# Patient Record
Sex: Female | Born: 1950 | Race: Black or African American | Hispanic: No | Marital: Single | Smoking: Former smoker
Health system: Southern US, Community
[De-identification: ages and names within clinical notes are randomized; demographics above are authoritative.]

## PROBLEM LIST (undated history)

## (undated) ENCOUNTER — Emergency Department (HOSPITAL_COMMUNITY): Payer: PRIVATE HEALTH INSURANCE | Source: Home / Self Care

## (undated) DIAGNOSIS — I10 Essential (primary) hypertension: Secondary | ICD-10-CM

## (undated) DIAGNOSIS — K219 Gastro-esophageal reflux disease without esophagitis: Secondary | ICD-10-CM

## (undated) DIAGNOSIS — R569 Unspecified convulsions: Secondary | ICD-10-CM

## (undated) DIAGNOSIS — M109 Gout, unspecified: Secondary | ICD-10-CM

## (undated) DIAGNOSIS — Z87440 Personal history of urinary (tract) infections: Secondary | ICD-10-CM

## (undated) DIAGNOSIS — F329 Major depressive disorder, single episode, unspecified: Secondary | ICD-10-CM

## (undated) DIAGNOSIS — N183 Chronic kidney disease, stage 3 (moderate): Secondary | ICD-10-CM

## (undated) DIAGNOSIS — R202 Paresthesia of skin: Secondary | ICD-10-CM

## (undated) DIAGNOSIS — F32A Depression, unspecified: Secondary | ICD-10-CM

## (undated) DIAGNOSIS — R0602 Shortness of breath: Secondary | ICD-10-CM

## (undated) DIAGNOSIS — C801 Malignant (primary) neoplasm, unspecified: Secondary | ICD-10-CM

## (undated) DIAGNOSIS — M1990 Unspecified osteoarthritis, unspecified site: Secondary | ICD-10-CM

## (undated) DIAGNOSIS — C20 Malignant neoplasm of rectum: Secondary | ICD-10-CM

## (undated) DIAGNOSIS — E611 Iron deficiency: Secondary | ICD-10-CM

## (undated) DIAGNOSIS — C3490 Malignant neoplasm of unspecified part of unspecified bronchus or lung: Secondary | ICD-10-CM

## (undated) DIAGNOSIS — F79 Unspecified intellectual disabilities: Secondary | ICD-10-CM

## (undated) DIAGNOSIS — E538 Deficiency of other specified B group vitamins: Secondary | ICD-10-CM

## (undated) DIAGNOSIS — R200 Anesthesia of skin: Secondary | ICD-10-CM

## (undated) HISTORY — PX: ABDOMINAL HYSTERECTOMY: SHX81

## (undated) HISTORY — DX: Chronic kidney disease, stage 3 (moderate): N18.3

## (undated) HISTORY — DX: Deficiency of other specified B group vitamins: E53.8

## (undated) HISTORY — DX: Malignant neoplasm of rectum: C20

## (undated) HISTORY — DX: Iron deficiency: E61.1

---

## 2000-08-11 ENCOUNTER — Other Ambulatory Visit: Admission: RE | Admit: 2000-08-11 | Discharge: 2000-08-11 | Payer: Self-pay | Admitting: Family Medicine

## 2000-08-12 ENCOUNTER — Ambulatory Visit (HOSPITAL_COMMUNITY): Admission: RE | Admit: 2000-08-12 | Discharge: 2000-08-12 | Payer: Self-pay | Admitting: Family Medicine

## 2000-08-12 ENCOUNTER — Encounter: Payer: Self-pay | Admitting: Family Medicine

## 2001-02-17 ENCOUNTER — Encounter: Payer: Self-pay | Admitting: Family Medicine

## 2001-02-17 ENCOUNTER — Ambulatory Visit (HOSPITAL_COMMUNITY): Admission: RE | Admit: 2001-02-17 | Discharge: 2001-02-17 | Payer: Self-pay | Admitting: Family Medicine

## 2001-06-18 ENCOUNTER — Emergency Department (HOSPITAL_COMMUNITY): Admission: EM | Admit: 2001-06-18 | Discharge: 2001-06-18 | Payer: Self-pay | Admitting: Emergency Medicine

## 2001-10-08 ENCOUNTER — Ambulatory Visit (HOSPITAL_COMMUNITY): Admission: RE | Admit: 2001-10-08 | Discharge: 2001-10-08 | Payer: Self-pay | Admitting: Family Medicine

## 2001-10-08 ENCOUNTER — Encounter: Payer: Self-pay | Admitting: Family Medicine

## 2001-12-07 ENCOUNTER — Ambulatory Visit (HOSPITAL_COMMUNITY): Admission: RE | Admit: 2001-12-07 | Discharge: 2001-12-07 | Payer: Self-pay | Admitting: Family Medicine

## 2001-12-07 ENCOUNTER — Encounter: Payer: Self-pay | Admitting: Family Medicine

## 2002-01-07 ENCOUNTER — Emergency Department (HOSPITAL_COMMUNITY): Admission: EM | Admit: 2002-01-07 | Discharge: 2002-01-07 | Payer: Self-pay | Admitting: Internal Medicine

## 2002-01-07 ENCOUNTER — Encounter: Payer: Self-pay | Admitting: Internal Medicine

## 2002-01-16 ENCOUNTER — Emergency Department (HOSPITAL_COMMUNITY): Admission: EM | Admit: 2002-01-16 | Discharge: 2002-01-16 | Payer: Self-pay | Admitting: Internal Medicine

## 2002-01-16 ENCOUNTER — Encounter: Payer: Self-pay | Admitting: Internal Medicine

## 2002-02-04 ENCOUNTER — Encounter (HOSPITAL_COMMUNITY): Admission: RE | Admit: 2002-02-04 | Discharge: 2002-03-06 | Payer: Self-pay | Admitting: Orthopaedic Surgery

## 2002-02-04 ENCOUNTER — Encounter: Payer: Self-pay | Admitting: Orthopaedic Surgery

## 2003-10-17 ENCOUNTER — Ambulatory Visit (HOSPITAL_COMMUNITY): Admission: RE | Admit: 2003-10-17 | Discharge: 2003-10-17 | Payer: Self-pay | Admitting: Family Medicine

## 2005-10-30 ENCOUNTER — Encounter (INDEPENDENT_AMBULATORY_CARE_PROVIDER_SITE_OTHER): Payer: Self-pay | Admitting: Internal Medicine

## 2005-11-12 ENCOUNTER — Emergency Department (HOSPITAL_COMMUNITY): Admission: EM | Admit: 2005-11-12 | Discharge: 2005-11-12 | Payer: Self-pay | Admitting: Emergency Medicine

## 2005-11-16 ENCOUNTER — Inpatient Hospital Stay (HOSPITAL_COMMUNITY): Admission: EM | Admit: 2005-11-16 | Discharge: 2005-11-22 | Payer: Self-pay | Admitting: Emergency Medicine

## 2006-01-17 ENCOUNTER — Emergency Department (HOSPITAL_COMMUNITY): Admission: EM | Admit: 2006-01-17 | Discharge: 2006-01-17 | Payer: Self-pay | Admitting: Emergency Medicine

## 2006-11-03 ENCOUNTER — Ambulatory Visit: Payer: Self-pay | Admitting: Internal Medicine

## 2006-11-03 DIAGNOSIS — Z87898 Personal history of other specified conditions: Secondary | ICD-10-CM

## 2006-11-03 DIAGNOSIS — R569 Unspecified convulsions: Secondary | ICD-10-CM

## 2006-11-03 DIAGNOSIS — E785 Hyperlipidemia, unspecified: Secondary | ICD-10-CM | POA: Insufficient documentation

## 2006-11-03 DIAGNOSIS — J45909 Unspecified asthma, uncomplicated: Secondary | ICD-10-CM | POA: Insufficient documentation

## 2006-11-03 DIAGNOSIS — I10 Essential (primary) hypertension: Secondary | ICD-10-CM

## 2006-11-03 DIAGNOSIS — F329 Major depressive disorder, single episode, unspecified: Secondary | ICD-10-CM

## 2006-11-03 DIAGNOSIS — F79 Unspecified intellectual disabilities: Secondary | ICD-10-CM

## 2006-11-03 DIAGNOSIS — J309 Allergic rhinitis, unspecified: Secondary | ICD-10-CM | POA: Insufficient documentation

## 2006-11-03 DIAGNOSIS — M129 Arthropathy, unspecified: Secondary | ICD-10-CM | POA: Insufficient documentation

## 2006-11-03 DIAGNOSIS — M109 Gout, unspecified: Secondary | ICD-10-CM

## 2006-11-03 LAB — CONVERTED CEMR LAB
BUN: 31 mg/dL — ABNORMAL HIGH (ref 6–23)
Blood Glucose, Fingerstick: 137
Chloride: 102 meq/L (ref 96–112)
Creatinine, Ser: 1.1 mg/dL (ref 0.40–1.20)
HDL: 66 mg/dL (ref >39)
Hgb A1c MFr Bld: 6.7 %
LDL Cholesterol: 118 mg/dL — ABNORMAL HIGH (ref 0–99)
Potassium: 4.3 meq/L (ref 3.5–5.3)
Sodium: 140 meq/L (ref 135–145)
Total Bilirubin: 0.3 mg/dL (ref 0.3–1.2)
Total CHOL/HDL Ratio: 3.3

## 2006-11-04 ENCOUNTER — Encounter (INDEPENDENT_AMBULATORY_CARE_PROVIDER_SITE_OTHER): Payer: Self-pay | Admitting: Internal Medicine

## 2006-11-04 ENCOUNTER — Telehealth (INDEPENDENT_AMBULATORY_CARE_PROVIDER_SITE_OTHER): Payer: Self-pay | Admitting: *Deleted

## 2006-11-04 LAB — CONVERTED CEMR LAB
HCV Ab: NEGATIVE
Hep B S Ab: NEGATIVE
Hepatitis B Surface Ag: NEGATIVE

## 2006-11-06 ENCOUNTER — Telehealth (INDEPENDENT_AMBULATORY_CARE_PROVIDER_SITE_OTHER): Payer: Self-pay | Admitting: *Deleted

## 2006-11-10 ENCOUNTER — Encounter (INDEPENDENT_AMBULATORY_CARE_PROVIDER_SITE_OTHER): Payer: Self-pay | Admitting: Internal Medicine

## 2006-11-11 ENCOUNTER — Encounter (INDEPENDENT_AMBULATORY_CARE_PROVIDER_SITE_OTHER): Payer: Self-pay | Admitting: Internal Medicine

## 2006-11-12 ENCOUNTER — Telehealth (INDEPENDENT_AMBULATORY_CARE_PROVIDER_SITE_OTHER): Payer: Self-pay | Admitting: Internal Medicine

## 2006-11-20 ENCOUNTER — Ambulatory Visit: Payer: Self-pay | Admitting: Internal Medicine

## 2006-11-20 DIAGNOSIS — K869 Disease of pancreas, unspecified: Secondary | ICD-10-CM | POA: Insufficient documentation

## 2006-11-22 ENCOUNTER — Emergency Department (HOSPITAL_COMMUNITY): Admission: EM | Admit: 2006-11-22 | Discharge: 2006-11-22 | Payer: Self-pay | Admitting: Emergency Medicine

## 2006-11-24 LAB — CONVERTED CEMR LAB: Phenytoin Lvl: 16.1 ug/mL (ref 10.0–20.0)

## 2006-11-25 ENCOUNTER — Encounter (INDEPENDENT_AMBULATORY_CARE_PROVIDER_SITE_OTHER): Payer: Self-pay | Admitting: Internal Medicine

## 2006-12-18 ENCOUNTER — Ambulatory Visit (HOSPITAL_COMMUNITY): Admission: RE | Admit: 2006-12-18 | Discharge: 2006-12-18 | Payer: Self-pay | Admitting: Pulmonary Disease

## 2006-12-23 ENCOUNTER — Encounter (INDEPENDENT_AMBULATORY_CARE_PROVIDER_SITE_OTHER): Payer: Self-pay | Admitting: Internal Medicine

## 2007-01-06 ENCOUNTER — Ambulatory Visit: Payer: Self-pay | Admitting: Internal Medicine

## 2007-01-07 LAB — CONVERTED CEMR LAB
ALT: 33 units/L (ref 0–35)
AST: 29 units/L (ref 0–37)
Alkaline Phosphatase: 232 units/L — ABNORMAL HIGH (ref 39–117)

## 2007-01-23 ENCOUNTER — Telehealth (INDEPENDENT_AMBULATORY_CARE_PROVIDER_SITE_OTHER): Payer: Self-pay | Admitting: Internal Medicine

## 2007-01-28 ENCOUNTER — Encounter (INDEPENDENT_AMBULATORY_CARE_PROVIDER_SITE_OTHER): Payer: Self-pay | Admitting: Internal Medicine

## 2007-02-03 ENCOUNTER — Telehealth (INDEPENDENT_AMBULATORY_CARE_PROVIDER_SITE_OTHER): Payer: Self-pay | Admitting: *Deleted

## 2007-02-03 ENCOUNTER — Ambulatory Visit: Payer: Self-pay | Admitting: Internal Medicine

## 2007-02-03 DIAGNOSIS — G479 Sleep disorder, unspecified: Secondary | ICD-10-CM | POA: Insufficient documentation

## 2007-02-03 LAB — CONVERTED CEMR LAB: Blood Glucose, Fingerstick: 140

## 2007-02-06 ENCOUNTER — Encounter (INDEPENDENT_AMBULATORY_CARE_PROVIDER_SITE_OTHER): Payer: Self-pay | Admitting: Internal Medicine

## 2007-02-06 LAB — CONVERTED CEMR LAB: Creatinine, Urine: 65.2 mg/dL

## 2007-02-17 ENCOUNTER — Telehealth (INDEPENDENT_AMBULATORY_CARE_PROVIDER_SITE_OTHER): Payer: Self-pay | Admitting: *Deleted

## 2007-03-27 ENCOUNTER — Encounter (INDEPENDENT_AMBULATORY_CARE_PROVIDER_SITE_OTHER): Payer: Self-pay | Admitting: Internal Medicine

## 2007-04-24 ENCOUNTER — Telehealth (INDEPENDENT_AMBULATORY_CARE_PROVIDER_SITE_OTHER): Payer: Self-pay | Admitting: Internal Medicine

## 2007-04-27 ENCOUNTER — Ambulatory Visit: Payer: Self-pay | Admitting: Internal Medicine

## 2007-04-27 LAB — CONVERTED CEMR LAB: Blood Glucose, Fingerstick: 175

## 2007-04-28 DIAGNOSIS — R945 Abnormal results of liver function studies: Secondary | ICD-10-CM | POA: Insufficient documentation

## 2007-04-28 LAB — CONVERTED CEMR LAB
Albumin: 4.3 g/dL (ref 3.5–5.2)
Alkaline Phosphatase: 237 units/L — ABNORMAL HIGH (ref 39–117)
BUN: 26 mg/dL — ABNORMAL HIGH (ref 6–23)
CO2: 21 meq/L (ref 19–32)
Calcium: 9.2 mg/dL (ref 8.4–10.5)
Chloride: 100 meq/L (ref 96–112)
Creatinine, Ser: 0.97 mg/dL (ref 0.40–1.20)
LDL Cholesterol: 74 mg/dL (ref 0–99)
Sodium: 137 meq/L (ref 135–145)
Total Bilirubin: 0.2 mg/dL — ABNORMAL LOW (ref 0.3–1.2)

## 2007-05-13 ENCOUNTER — Encounter (INDEPENDENT_AMBULATORY_CARE_PROVIDER_SITE_OTHER): Payer: Self-pay | Admitting: Internal Medicine

## 2007-06-19 ENCOUNTER — Telehealth (INDEPENDENT_AMBULATORY_CARE_PROVIDER_SITE_OTHER): Payer: Self-pay | Admitting: Internal Medicine

## 2007-07-23 ENCOUNTER — Encounter (INDEPENDENT_AMBULATORY_CARE_PROVIDER_SITE_OTHER): Payer: Self-pay | Admitting: Internal Medicine

## 2007-07-27 ENCOUNTER — Ambulatory Visit: Payer: Self-pay | Admitting: Internal Medicine

## 2007-07-27 DIAGNOSIS — E1165 Type 2 diabetes mellitus with hyperglycemia: Secondary | ICD-10-CM | POA: Insufficient documentation

## 2007-07-27 LAB — CONVERTED CEMR LAB
Blood Glucose, Fingerstick: 211
Hgb A1c MFr Bld: 7.4 %

## 2007-07-28 LAB — CONVERTED CEMR LAB
ALT: 35 units/L (ref 0–35)
Alkaline Phosphatase: 225 units/L — ABNORMAL HIGH (ref 39–117)
Phenytoin Lvl: 16.8 ug/mL (ref 10.0–20.0)
Total Protein: 7.3 g/dL (ref 6.0–8.3)

## 2007-07-30 ENCOUNTER — Encounter (INDEPENDENT_AMBULATORY_CARE_PROVIDER_SITE_OTHER): Payer: Self-pay | Admitting: Internal Medicine

## 2007-10-19 ENCOUNTER — Ambulatory Visit: Payer: Self-pay | Admitting: Internal Medicine

## 2007-10-19 DIAGNOSIS — L538 Other specified erythematous conditions: Secondary | ICD-10-CM | POA: Insufficient documentation

## 2007-10-20 ENCOUNTER — Encounter (INDEPENDENT_AMBULATORY_CARE_PROVIDER_SITE_OTHER): Payer: Self-pay | Admitting: Internal Medicine

## 2007-10-21 LAB — CONVERTED CEMR LAB
AST: 29 units/L (ref 0–37)
Albumin: 4 g/dL (ref 3.5–5.2)
BUN: 27 mg/dL — ABNORMAL HIGH (ref 6–23)
Basophils Relative: 0 % (ref 0–1)
Calcium: 9 mg/dL (ref 8.4–10.5)
Cholesterol: 200 mg/dL (ref 0–200)
Creatinine, Ser: 1.05 mg/dL (ref 0.40–1.20)
Eosinophils Absolute: 0.5 10*3/uL (ref 0.0–0.7)
Eosinophils Relative: 6 % — ABNORMAL HIGH (ref 0–5)
HCT: 33.7 % — ABNORMAL LOW (ref 36.0–46.0)
Lymphs Abs: 1.9 10*3/uL (ref 0.7–4.0)
Monocytes Absolute: 0.6 10*3/uL (ref 0.1–1.0)
Monocytes Relative: 7 % (ref 3–12)
Neutrophils Relative %: 67 % (ref 43–77)
Potassium: 4.8 meq/L (ref 3.5–5.3)
RDW: 14.2 % (ref 11.5–15.5)
Total CHOL/HDL Ratio: 3.5
Triglycerides: 261 mg/dL — ABNORMAL HIGH (ref <150)
VLDL: 52 mg/dL — ABNORMAL HIGH (ref 0–40)
WBC: 9.2 10*3/uL (ref 4.0–10.5)

## 2007-10-30 ENCOUNTER — Ambulatory Visit (HOSPITAL_COMMUNITY): Admission: RE | Admit: 2007-10-30 | Discharge: 2007-10-30 | Payer: Self-pay | Admitting: Internal Medicine

## 2007-11-03 ENCOUNTER — Encounter (INDEPENDENT_AMBULATORY_CARE_PROVIDER_SITE_OTHER): Payer: Self-pay | Admitting: Internal Medicine

## 2007-11-04 LAB — CONVERTED CEMR LAB
Basophils Absolute: 0 10*3/uL (ref 0.0–0.1)
Eosinophils Absolute: 0.5 10*3/uL (ref 0.0–0.7)
Hemoglobin: 10.1 g/dL — ABNORMAL LOW (ref 12.0–15.0)
Lymphs Abs: 2 10*3/uL (ref 0.7–4.0)
Monocytes Absolute: 0.6 10*3/uL (ref 0.1–1.0)
Monocytes Relative: 7 % (ref 3–12)
Neutro Abs: 5.2 10*3/uL (ref 1.7–7.7)
Neutrophils Relative %: 62 % (ref 43–77)
RDW: 14 % (ref 11.5–15.5)

## 2008-01-14 ENCOUNTER — Encounter (INDEPENDENT_AMBULATORY_CARE_PROVIDER_SITE_OTHER): Payer: Self-pay | Admitting: Internal Medicine

## 2008-02-26 ENCOUNTER — Encounter (INDEPENDENT_AMBULATORY_CARE_PROVIDER_SITE_OTHER): Payer: Self-pay | Admitting: Internal Medicine

## 2008-02-27 ENCOUNTER — Encounter (INDEPENDENT_AMBULATORY_CARE_PROVIDER_SITE_OTHER): Payer: Self-pay | Admitting: Internal Medicine

## 2008-03-02 ENCOUNTER — Ambulatory Visit: Payer: Self-pay | Admitting: Internal Medicine

## 2008-03-02 DIAGNOSIS — I498 Other specified cardiac arrhythmias: Secondary | ICD-10-CM | POA: Insufficient documentation

## 2008-03-03 ENCOUNTER — Ambulatory Visit: Payer: Self-pay | Admitting: Cardiovascular Disease

## 2008-03-07 ENCOUNTER — Encounter (INDEPENDENT_AMBULATORY_CARE_PROVIDER_SITE_OTHER): Payer: Self-pay | Admitting: Internal Medicine

## 2008-03-08 ENCOUNTER — Ambulatory Visit (HOSPITAL_COMMUNITY): Admission: RE | Admit: 2008-03-08 | Discharge: 2008-03-08 | Payer: Self-pay | Admitting: Internal Medicine

## 2008-03-08 ENCOUNTER — Encounter (INDEPENDENT_AMBULATORY_CARE_PROVIDER_SITE_OTHER): Payer: Self-pay | Admitting: Internal Medicine

## 2008-03-09 ENCOUNTER — Encounter (INDEPENDENT_AMBULATORY_CARE_PROVIDER_SITE_OTHER): Payer: Self-pay | Admitting: *Deleted

## 2008-03-09 ENCOUNTER — Encounter (INDEPENDENT_AMBULATORY_CARE_PROVIDER_SITE_OTHER): Payer: Self-pay | Admitting: Internal Medicine

## 2008-03-23 ENCOUNTER — Encounter (INDEPENDENT_AMBULATORY_CARE_PROVIDER_SITE_OTHER): Payer: Self-pay | Admitting: Internal Medicine

## 2008-03-30 ENCOUNTER — Ambulatory Visit: Payer: Self-pay | Admitting: Internal Medicine

## 2008-04-29 ENCOUNTER — Ambulatory Visit: Payer: Self-pay | Admitting: Internal Medicine

## 2008-05-27 ENCOUNTER — Ambulatory Visit: Payer: Self-pay | Admitting: Internal Medicine

## 2008-05-27 DIAGNOSIS — R109 Unspecified abdominal pain: Secondary | ICD-10-CM

## 2008-06-30 ENCOUNTER — Telehealth (INDEPENDENT_AMBULATORY_CARE_PROVIDER_SITE_OTHER): Payer: Self-pay | Admitting: Internal Medicine

## 2008-07-01 ENCOUNTER — Ambulatory Visit: Payer: Self-pay | Admitting: Internal Medicine

## 2008-07-01 DIAGNOSIS — J45901 Unspecified asthma with (acute) exacerbation: Secondary | ICD-10-CM | POA: Insufficient documentation

## 2008-07-12 ENCOUNTER — Encounter (INDEPENDENT_AMBULATORY_CARE_PROVIDER_SITE_OTHER): Payer: Self-pay | Admitting: Internal Medicine

## 2008-08-09 ENCOUNTER — Encounter (INDEPENDENT_AMBULATORY_CARE_PROVIDER_SITE_OTHER): Payer: Self-pay | Admitting: Internal Medicine

## 2008-08-26 ENCOUNTER — Ambulatory Visit: Payer: Self-pay | Admitting: Internal Medicine

## 2008-08-26 LAB — CONVERTED CEMR LAB: Hgb A1c MFr Bld: 7.3 %

## 2008-08-29 ENCOUNTER — Encounter (INDEPENDENT_AMBULATORY_CARE_PROVIDER_SITE_OTHER): Payer: Self-pay | Admitting: Internal Medicine

## 2008-08-29 DIAGNOSIS — D649 Anemia, unspecified: Secondary | ICD-10-CM

## 2008-08-29 LAB — CONVERTED CEMR LAB
ALT: 30 units/L (ref 0–35)
BUN: 30 mg/dL — ABNORMAL HIGH (ref 6–23)
Calcium: 9.1 mg/dL (ref 8.4–10.5)
Cholesterol: 190 mg/dL (ref 0–200)
Eosinophils Absolute: 0.7 10*3/uL (ref 0.0–0.7)
Eosinophils Relative: 8 % — ABNORMAL HIGH (ref 0–5)
HDL: 45 mg/dL (ref >39)
Lymphs Abs: 1.9 10*3/uL (ref 0.7–4.0)
Monocytes Relative: 9 % (ref 3–12)
Neutrophils Relative %: 63 % (ref 43–77)
RBC: 3.92 M/uL (ref 3.87–5.11)
RDW: 14.5 % (ref 11.5–15.5)
Total Bilirubin: 0.2 mg/dL — ABNORMAL LOW (ref 0.3–1.2)
Total Protein: 7.6 g/dL (ref 6.0–8.3)
VLDL: 50 mg/dL — ABNORMAL HIGH (ref 0–40)

## 2008-09-01 ENCOUNTER — Encounter (INDEPENDENT_AMBULATORY_CARE_PROVIDER_SITE_OTHER): Payer: Self-pay | Admitting: Internal Medicine

## 2008-09-02 ENCOUNTER — Encounter (INDEPENDENT_AMBULATORY_CARE_PROVIDER_SITE_OTHER): Payer: Self-pay | Admitting: Internal Medicine

## 2008-09-28 ENCOUNTER — Ambulatory Visit (HOSPITAL_COMMUNITY): Payer: Self-pay | Admitting: Oncology

## 2008-09-28 ENCOUNTER — Encounter (HOSPITAL_COMMUNITY): Admission: RE | Admit: 2008-09-28 | Discharge: 2008-10-28 | Payer: Self-pay | Admitting: Oncology

## 2008-10-19 ENCOUNTER — Encounter (INDEPENDENT_AMBULATORY_CARE_PROVIDER_SITE_OTHER): Payer: Self-pay | Admitting: Internal Medicine

## 2008-10-24 ENCOUNTER — Encounter (INDEPENDENT_AMBULATORY_CARE_PROVIDER_SITE_OTHER): Payer: Self-pay | Admitting: Internal Medicine

## 2008-10-24 ENCOUNTER — Telehealth (INDEPENDENT_AMBULATORY_CARE_PROVIDER_SITE_OTHER): Payer: Self-pay | Admitting: Internal Medicine

## 2008-11-01 ENCOUNTER — Ambulatory Visit (HOSPITAL_COMMUNITY): Admission: RE | Admit: 2008-11-01 | Discharge: 2008-11-01 | Payer: Self-pay | Admitting: Internal Medicine

## 2008-11-10 ENCOUNTER — Telehealth (INDEPENDENT_AMBULATORY_CARE_PROVIDER_SITE_OTHER): Payer: Self-pay | Admitting: *Deleted

## 2008-11-16 ENCOUNTER — Encounter (INDEPENDENT_AMBULATORY_CARE_PROVIDER_SITE_OTHER): Payer: Self-pay | Admitting: Internal Medicine

## 2008-11-25 ENCOUNTER — Ambulatory Visit: Payer: Self-pay | Admitting: Internal Medicine

## 2008-11-25 LAB — CONVERTED CEMR LAB: Hgb A1c MFr Bld: 7.2 %

## 2009-01-05 ENCOUNTER — Encounter (HOSPITAL_COMMUNITY): Admission: RE | Admit: 2009-01-05 | Discharge: 2009-02-04 | Payer: Self-pay | Admitting: Oncology

## 2009-01-23 ENCOUNTER — Ambulatory Visit (HOSPITAL_COMMUNITY): Payer: Self-pay | Admitting: Oncology

## 2009-02-15 ENCOUNTER — Encounter (HOSPITAL_COMMUNITY): Admission: RE | Admit: 2009-02-15 | Discharge: 2009-03-15 | Payer: Self-pay | Admitting: Oncology

## 2009-09-18 ENCOUNTER — Ambulatory Visit (HOSPITAL_COMMUNITY): Admission: RE | Admit: 2009-09-18 | Discharge: 2009-09-18 | Payer: Self-pay | Admitting: Pulmonary Disease

## 2009-11-07 ENCOUNTER — Ambulatory Visit (HOSPITAL_COMMUNITY): Payer: Self-pay | Admitting: Oncology

## 2009-11-07 ENCOUNTER — Encounter (HOSPITAL_COMMUNITY): Admission: RE | Admit: 2009-11-07 | Discharge: 2009-12-07 | Payer: Self-pay | Admitting: Oncology

## 2010-04-08 ENCOUNTER — Encounter (HOSPITAL_COMMUNITY): Payer: Self-pay | Admitting: Oncology

## 2010-04-09 ENCOUNTER — Encounter: Payer: Self-pay | Admitting: Internal Medicine

## 2010-04-15 LAB — CONVERTED CEMR LAB
ALT: 27 units/L (ref 0–35)
Albumin: 4.1 g/dL (ref 3.5–5.2)
Alkaline Phosphatase: 236 units/L — ABNORMAL HIGH (ref 39–117)
Calcium: 9.4 mg/dL (ref 8.4–10.5)
Glucose, Bld: 124 mg/dL — ABNORMAL HIGH (ref 70–99)
HCT: 32.6 % — ABNORMAL LOW (ref 36.0–46.0)
Lymphs Abs: 1.9 10*3/uL (ref 0.7–4.0)
MCHC: 31 g/dL (ref 30.0–36.0)
MCV: 82.7 fL (ref 78.0–100.0)
Monocytes Relative: 9 % (ref 3–12)
Neutrophils Relative %: 63 % (ref 43–77)
Platelets: 264 10*3/uL (ref 150–400)
RDW: 14.4 % (ref 11.5–15.5)
Saturation Ratios: 19 % — ABNORMAL LOW (ref 20–55)
TSH: 3.176 microintl units/mL (ref 0.350–4.50)
Total Bilirubin: 0.2 mg/dL — ABNORMAL LOW (ref 0.3–1.2)
Total Protein: 7.6 g/dL (ref 6.0–8.3)

## 2010-05-29 ENCOUNTER — Encounter (HOSPITAL_COMMUNITY): Payer: PRIVATE HEALTH INSURANCE | Attending: Oncology

## 2010-05-29 ENCOUNTER — Ambulatory Visit (HOSPITAL_COMMUNITY): Payer: PRIVATE HEALTH INSURANCE | Admitting: Oncology

## 2010-05-29 DIAGNOSIS — D638 Anemia in other chronic diseases classified elsewhere: Secondary | ICD-10-CM

## 2010-05-29 DIAGNOSIS — D649 Anemia, unspecified: Secondary | ICD-10-CM | POA: Insufficient documentation

## 2010-05-29 DIAGNOSIS — Z79899 Other long term (current) drug therapy: Secondary | ICD-10-CM | POA: Insufficient documentation

## 2010-05-29 DIAGNOSIS — E119 Type 2 diabetes mellitus without complications: Secondary | ICD-10-CM | POA: Insufficient documentation

## 2010-05-29 DIAGNOSIS — N289 Disorder of kidney and ureter, unspecified: Secondary | ICD-10-CM | POA: Insufficient documentation

## 2010-05-29 DIAGNOSIS — F79 Unspecified intellectual disabilities: Secondary | ICD-10-CM | POA: Insufficient documentation

## 2010-05-31 LAB — DIFFERENTIAL
Basophils Absolute: 0.1 10*3/uL (ref 0.0–0.1)
Eosinophils Absolute: 0.7 10*3/uL (ref 0.0–0.7)
Eosinophils Relative: 9 % — ABNORMAL HIGH (ref 0–5)
Lymphocytes Relative: 26 % (ref 12–46)
Monocytes Absolute: 0.6 10*3/uL (ref 0.1–1.0)

## 2010-05-31 LAB — BASIC METABOLIC PANEL
BUN: 24 mg/dL — ABNORMAL HIGH (ref 6–23)
CO2: 27 mEq/L (ref 19–32)
Chloride: 101 mEq/L (ref 96–112)
Creatinine, Ser: 1.01 mg/dL (ref 0.4–1.2)
Glucose, Bld: 99 mg/dL (ref 70–99)
Potassium: 4.7 mEq/L (ref 3.5–5.1)

## 2010-05-31 LAB — CBC
HCT: 32.1 % — ABNORMAL LOW (ref 36.0–46.0)
MCH: 26.7 pg (ref 26.0–34.0)
MCHC: 32 g/dL (ref 30.0–36.0)
MCV: 83.5 fL (ref 78.0–100.0)
Platelets: 239 10*3/uL (ref 150–400)
RDW: 13.7 % (ref 11.5–15.5)

## 2010-05-31 LAB — RETICULOCYTES
RBC.: 3.84 MIL/uL — ABNORMAL LOW (ref 3.87–5.11)
Retic Ct Pct: 1.3 % (ref 0.4–3.1)

## 2010-06-20 LAB — BASIC METABOLIC PANEL
BUN: 29 mg/dL — ABNORMAL HIGH (ref 6–23)
Calcium: 8.9 mg/dL (ref 8.4–10.5)
GFR calc non Af Amer: 50 mL/min — ABNORMAL LOW (ref >60)
Glucose, Bld: 122 mg/dL — ABNORMAL HIGH (ref 70–99)

## 2010-06-20 LAB — CBC
Platelets: 238 10*3/uL (ref 150–400)
RDW: 13.7 % (ref 11.5–15.5)

## 2010-06-24 LAB — FERRITIN: Ferritin: 51 ng/mL (ref 10–291)

## 2010-06-24 LAB — CBC
MCV: 83.9 fL (ref 78.0–100.0)
WBC: 9.8 10*3/uL (ref 4.0–10.5)

## 2010-06-24 LAB — COMPREHENSIVE METABOLIC PANEL
AST: 28 U/L (ref 0–37)
Albumin: 3.4 g/dL — ABNORMAL LOW (ref 3.5–5.2)
Chloride: 100 mEq/L (ref 96–112)
Creatinine, Ser: 1.17 mg/dL (ref 0.4–1.2)
GFR calc Af Amer: 58 mL/min — ABNORMAL LOW (ref >60)
Potassium: 4.9 mEq/L (ref 3.5–5.1)
Total Bilirubin: 0.3 mg/dL (ref 0.3–1.2)

## 2010-06-24 LAB — VITAMIN B12: Vitamin B-12: 546 pg/mL (ref 211–911)

## 2010-06-24 LAB — PROTEIN ELECTROPHORESIS, SERUM
Albumin ELP: 48.1 % — ABNORMAL LOW (ref 55.8–66.1)
Alpha-1-Globulin: 6.7 % — ABNORMAL HIGH (ref 2.9–4.9)
Beta 2: 7.5 % — ABNORMAL HIGH (ref 3.2–6.5)
Total Protein ELP: 7.4 g/dL (ref 6.0–8.3)

## 2010-06-24 LAB — HEPATITIS PANEL, ACUTE
Hep A IgM: NEGATIVE
Hep B C IgM: NEGATIVE

## 2010-06-24 LAB — FOLATE: Folate: 13 ng/mL

## 2010-06-24 LAB — IRON AND TIBC: Saturation Ratios: 14 % — ABNORMAL LOW (ref 20–55)

## 2010-06-24 LAB — RETICULOCYTES: Retic Count, Absolute: 48.9 10*3/uL (ref 19.0–186.0)

## 2010-08-03 NOTE — Group Therapy Note (Signed)
NAMEKHLOEE, [name]            ACCOUNT NO.:  [id number]   MEDICAL RECORD NO.:  [id number]          PATIENT TYPE:  INP   LOCATION:  A214                          FACILITY:  [facility]   PHYSICIAN:  Angus G. [name], MD   DATE OF BIRTH:  07-Feb-1951   DATE OF PROCEDURE:  11/21/2005  DATE OF DISCHARGE:                                   PROGRESS NOTE   SUBJECTIVE:  This patient was admitted with uncontrolled diabetes of new  onset, urinary tract infection, pyelonephritis, nephrolithiasis with  ureteral obstruction, questionable  pancreatic mass.  She did have a full  blown grand mal seizure and several small seizures, and had to be placed in  ICU, but is now back on concentrated care and telemetry.   OBJECTIVE:  VITAL SIGNS:  Blood pressure 157/73, respirations 20, pulse 81,  temperature 98.1.  LUNGS:  Diminished breath sounds.  HEART:  Regular rhythm.  ABDOMEN:  No palpable organs or masses.   ASSESSMENT:  The patient was admitted to the hospital with left flank pain,  nephrolithiasis, pyelonephritis, urinary tract infection, new onset  diabetes, developed seizure, grand mal-type, which appears to be under  control.   PLAN:  Plan to continue current regimen.  Will repeat Dilantin level.      Angus G. [name], MD  Electronically Signed     AGM/MEDQ  D:  11/21/2005  T:  11/21/2005  Job:  [id number]

## 2010-08-03 NOTE — Discharge Summary (Signed)
[name], [name]            ACCOUNT NO.:  [id number]   MEDICAL RECORD NO.:  [id number]          PATIENT TYPE:  INP   LOCATION:  A214                          FACILITY:  [facility]   PHYSICIAN:  Angus G. [name], MD   DATE OF BIRTH:  [birth year]   DATE OF ADMISSION:  11/16/2005  DATE OF DISCHARGE:  09/07/2007LH                                 DISCHARGE SUMMARY   DIAGNOSES:  1. Urinary tract infection with pyelonephritis.  2. Nephrolithiasis, stone in left renal pelvis.  3. Non-insulin dependent diabetes.  4. Pancreatic tail mass.  5. Mental retardation.  6. Grand mal seizures.  7. History of gout.   DISCHARGE CONDITION:  Stable.   This 60 year old African-American female has a past history of gout,  hyperlipidemia, is mentally retarded who came to the emergency room on  November 11, 2005 with flank pain with a diagnosis of urinary tract infection.  Culture subsequently had grown out multiple bacteria.  She subsequently came  back to the emergency room with labs revealing a glucose of 587.  She stated  she had known she had sugar, but had not been treated.  She also was  complaining at this time of significant flank pain.  CT scan reported by the  ED physician revealed a stone at the left ureteropelvic junction causing  intermittent blockage of the ureter.  The stone was described as 9.5 mm.  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  She was admitted for further  evaluation and treatment.   PHYSICAL EXAMINATION:  GENERAL:  Obese female; alert.  VITAL SIGNS:  Blood pressure 145/80, pulse 92, temperature 100.3.  HEENT:  Eyes:  PERRLA.  TMs negative.  Oropharynx benign.  LUNGS:  Diminished breath sounds.  ABDOMEN:  Obese.  No palpable organomegaly or masses.  EXTREMITIES:  No edema.  NEUROLOGICAL:  No focal deficit.   LABORATORY DATA:  Admission CBC:  WBC 8100, hemoglobin 11.9, hematocrit  36.3.  chemistries  on admission:  Sodium 125, potassium 4, chloride 88, CO2  27, glucose 587, BUN 13, creatinine 1.5, calcium 8.8.  Subsequent  chemistries on November 19, 2005:  Sodium 128, potassium 3.6, chloride 94,  CO2 18, glucose 341, BUN 15, creatinine 1.4.  Liver enzymes: SGOT 18, SGPT  19, alkaline phosphatase 221, total bilirubin 0.4.  Phenytoin level after  medication was 44.8; then on November 20, 2005, it was 8.3; on November 21, 2005, it was 9.7.  Urinalysis 7-10 WBCs.  Blood culture no growth in 5 days.  Urine culture 40,000 colonies, multiple organisms.   X-RAYS:  CT of the pelvis:  Lobulous soft tissue mass in the pelvis with  calcification most consistent with fibroid uterus.  Probable Paget's  involvement left iliac bone.  Chest x-ray:  No active pulmonary disease.   HOSPITAL COURSE:  The patient, at the time of her admission, was placed on  intravenous fluids of Normal Saline 100 mL an hour, IV Levaquin 750 mg every  24 hours, __________ p.o. q.4h. p.r.n. for pain, Lipitor 20 mg  daily,  allopurinol 100 mg daily, insulin on a sliding scale, Lantus insulin 10  units daily, modified carbohydrate diet.  She was subsequently started on  Flagyl 500 mg b.i.d., Dilaudid 1-2 mg every 3-4 hours and Lovenox subcu 40  mg every 24 hours.  On November 17, 2005, because of elevated blood sugars,  the patient was placed on Glucommander according to protocol and remained on  this until her sugars were under adequate control; and on November 18, 2005,  her Lantus insulin was increased to 25 units daily.  The patient, on  November 19, 2005, developed seizures which were grand mal type.  She had to  be placed in ICU; was given IV Cerebyx 1 gm and 2 mg of Ativan every 4 hours  p.r.n.  She subsequently was placed on p.o. Dilantin 100 mg q.i.d.  She had  to be deep suctioned in the ICU.  She was seen in consultation by Dr.  Gerilyn Pilgrim.  She remained in ICU until November 20, 2005, where she was placed  back on  2A, CCNT.  Her Dilantin levels were monitored until they became  therapeutic.  She had no further seizures after the initial event, but had  multiple small seizures during this event after the initial grand mal  seizure.  The patient was seen in consultation by Urology and they were to  follow the patient as an outpatient as well as being followed by Dr.  Gerilyn Pilgrim.  The patient was able to be discharged on the 6th hospital day to  be followed as an outpatient.   DISCHARGE MEDICATIONS:  1. Zocor 20 mg daily.  2. Zyloprim 100 mg daily.  3. Lantus insulin 20 units daily.  4. Dilantin 100 mg two caps b.i.d.  5. Levaquin 500 mg daily.      Angus G. [name], MD  Electronically Signed     AGM/MEDQ  D:  12/10/2005  T:  12/11/2005  Job:  [id number]

## 2010-08-03 NOTE — Group Therapy Note (Signed)
[name], [name]            ACCOUNT NO.:  [id number]   MEDICAL RECORD NO.:  [id number]          PATIENT TYPE:  EMS   LOCATION:  ED                            FACILITY:  [facility]   PHYSICIAN:  Angus G. [name], MD   DATE OF BIRTH:  1950-07-08   DATE OF PROCEDURE:  DATE OF DISCHARGE:  11/12/2005                                   PROGRESS NOTE   This patient was admitted with uncontrolled diabetes, urinary tract  infection, pyelonephritis, nephrolithiasis and ureteral obstruction,  questionable pancreatic mass.  The patient was placed back on Lantus and  sliding scale Humalog insulin.  This morning she did have a full blown grand  mal seizure.  She was given intravenous Cerebrex and Ativan and started on  p.o. Dilantin.   OBJECTIVE:  VITAL SIGNS:  Blood pressure 155/87, respirations 24, pulse 110.  Blood sugars have ranged from 188 to 316.  LUNGS:  Clear.  HEART:  Regular rhythm.  ABDOMEN:  No palpable organs or masses.   ASSESSMENT:  The patient has had grand mal seizure.  She does also have  above stated problems.  New onset diabetes which is in better control,  urinary tract infection, pyelonephritis, nephrolithiasis with ureteral  obstruction, questionable pancreatic mass.   PLAN:  To continue efforts to control seizure activity with IV Dilantin in  the form of Cerebrex, Ativan as needed.  The patient will have MRI of the  head and will be seen by Neurology.  Continue other medications.      Angus G. [name], MD  Electronically Signed     AGM/MEDQ  D:  11/19/2005  T:  11/19/2005  Job:  [id number]

## 2010-08-03 NOTE — Procedures (Signed)
NAMECYNAI, SKEENS            ACCOUNT NO.:  [id number]   MEDICAL RECORD NO.:  [id number]          PATIENT TYPE:  INP   LOCATION:  A214                          FACILITY:  [facility]   PHYSICIAN:  Kofi A. Gerilyn Pilgrim, M.D. DATE OF BIRTH:  November 25, 1950   DATE OF PROCEDURE:  DATE OF DISCHARGE:                                EEG INTERPRETATION   The patient is a 60 year old lady who has had repeated seizures, and was in  status epilepticus.   MEDICATIONS:  Dilantin, allopurinol, Zocor, insulin.   ANALYSIS:  A 16 channel recording is conducted using standard 10/20  protocol.  The recording time was 26 minutes.  There is a low voltage  activity noted with the activity recorded at 8-9 Hz.  Throughout most of the  recording, sleep architecture was noted with K-complexes and sleep spindles.  Photic stimulation was conducted and shows no significant changes in the  background activity.  There is no focal slowing, lateralized slowing or  epileptiform activity noted.   IMPRESSION:  This is a normal recording in both awake and asleep states.  A  single recording does note rule out epileptic seizures.      Kofi A. Gerilyn Pilgrim, M.D.  Electronically Signed     KAD/MEDQ  D:  11/20/2005  T:  11/20/2005  Job:  [id number]

## 2010-08-03 NOTE — Consult Note (Signed)
[name], [name]            ACCOUNT NO.:  [id number]   MEDICAL RECORD NO.:  [id number]          PATIENT TYPE:  INP   LOCATION:  A213                          FACILITY:  [facility]   PHYSICIAN:  Dennie Maizes, M.D.   DATE OF BIRTH:  08-27-1950   DATE OF CONSULTATION:  11/17/2005  DATE OF DISCHARGE:                                   CONSULTATION   REASON FOR CONSULTATION:  Left renal calculus, possible acute  pyelonephritis.   CONSULTATION REPORT:  This 60 year old female is mentally challenged and she  lives in a group home with her fiance.  I was unable to get any history from  the patient.  The history was provided by the fiance.   She has been having intermittent left flank pain of moderate severity for  about two weeks.  She also had fever.  She was brought to the emergency room  on November 11, 2005.  Urinalysis was suggestive of urinary tract infection.  Urine culture and sensitivity was done and she was started on Bactrim DS one  by mouth twice a day.  The culture grew multiple organisms consistent with  contamination.  The patient returned to the emergency room on November 16, 2005, with persistent left flank pain.  Her blood sugar was found to be very  high.  She has been admitted to the hospital.  She has been evaluated with a  non-contrast CT scan of the abdomen and pelvis.  This revealed a 9 x 5 mm  sized stone in the left renal pelvis without hydronephrosis.  There were no  ureteral calculi.  I was asked to see the patient for further evaluation and  management.   The patient denied having any urolithiasis or urinary tract infections in  the past.  She has urinary frequency x3-4 and nocturia x2-3.  There is no  history of dysuria or gross hematuria.   PAST MEDICAL HISTORY:  History of gout, elevated cholesterol.   MEDICATIONS:  1. Lipitor 20 mg, one by mouth daily.  2. Allopurinol 100 by mouth daily.  3  Bactrim DS one by mouth daily.   ALLERGIES:  NONE.   PHYSICAL EXAMINATION:  Abdomen.  No palpable flank mass or CVA tenderness.  Bladder not palpable.   ADMISSION LABS:  CBC, WBC 8.1, hemoglobin 11.9, hematocrit 36.3.  Urinalysis, WBC 7-10 per high-power field, bacteria few.  Hemoglobin A1C is  elevated 16.7.  Blood cultures, no growth at one day, final report is  pending.  Urine culture and sensitivity results are pending.  BUN 14,  creatinine mildly elevated 1.5, glucose is 478.   IMPRESSION:  Left renal calculus, left renal colic, urinary tract infection,  possible acute left pyelonephritis.   PLAN:  1. Await urine culture and sensitivity and blood culture and sensitivity      results.  2. Will do an x-ray of the KUB area for stone localization.  3. After treatment of the urinary tract infection and control of the      diabetes mellitus, we will schedule ESL of the left renal calculus as      an  outpatient.   Thanks for this consult.      Dennie Maizes, M.D.  Electronically Signed     SK/MEDQ  D:  11/17/2005  T:  11/18/2005  Job:  [id number]   cc:   Angus G. [name], MD  Fax: [phone]

## 2010-08-03 NOTE — Group Therapy Note (Signed)
[name], [name]            ACCOUNT NO.:  [id number]   MEDICAL RECORD NO.:  [id number]          PATIENT TYPE:  INP   LOCATION:  A213                          FACILITY:  [facility]   PHYSICIAN:  Angus G. [name], MD   DATE OF BIRTH:  Jul 31, 1950   DATE OF PROCEDURE:  DATE OF DISCHARGE:                                   PROGRESS NOTE   This patient was admitted.  Had abdominal pain, mainly in the left flank.  A  CT scan showed a stone in the left ureteropelvic junction, 9.5 mm;  questionable mass in the tail of the pancreas.  Has had markedly elevated  blood sugars in the 500 range since admission.  Has received insulin, and  received NovoLog insulin 20 units this a.m.  With sugars over 500, she does  not seem to have evidence of ketoacidosis and remains fairly comfortable.   OBJECTIVE:  VITAL SIGNS:  Blood pressure 156/87, respirations 16, pulse 77,  temperature 98.7.  LUNGS:  Clear.  HEART:  Regular rhythm.  ABDOMEN:  No palpable organs or masses.   LABORATORY DATA:  Chemistry:  Sodium 131, potassium 5.1, chloride 92, CO2  27, glucose 577, BUN 14, creatinine 1.5.   ASSESSMENT:  Patient admitted with left flank pain, pyelonephritis, stone in  left ureteropelvic junction, approximately 9.5 mm in diameter, intermittent  obstruction in left renal pelvis, small mass in the tail of the pancreas.  The patient has markedly elevated blood sugars.   PLAN:  Continue sliding scale Humalog insulin along with Lantus insulin.  Continue IV antibiotics.  The patient will be seen by urology for further  evaluation of the stone.      Angus G. [name], MD  Electronically Signed     AGM/MEDQ  D:  11/17/2005  T:  11/18/2005  Job:  [id number]

## 2010-08-03 NOTE — Consult Note (Signed)
[name], [name]            ACCOUNT NO.:  [id number]   MEDICAL RECORD NO.:  [id number]          PATIENT TYPE:  INP   LOCATION:  A214                          FACILITY:  [facility]   PHYSICIAN:  Kofi A. Gerilyn Pilgrim, M.D. DATE OF BIRTH:  06-11-1950   DATE OF CONSULTATION:  DATE OF DISCHARGE:                                   CONSULTATION   REASON FOR CONSULTATION:  Seizures.   HISTORY:  A 60 year old black female who was admitted to the hospital for  abdominal pain thought to be due to pyelonephritis. The patient has also  been diagnosed with new onset diabetes mellitus on this admission. The  patient was on the floor when she had about 5 seizures. The nurses gave a  pretty good description of the semiology. The patient turns her head towards  the right and extends the right upper extremity with clonic activity noted.  She is confused afterwards. The left side is not involved. Again she had a  few of these events about 15 minutes apart which resulted in the patient  being sent to the ICU. She was given 1 gram of dilantin and Ativan but  continued to have these events resulting in the patient having another gram  of dilantin. The patient has not had any events since given the two loading  doses of dilantin. There is no previous history of seizure. There are  reports that the patient has history of heavy alcohol use. She has been  hospitalized 3 days after she had the seizures.   PAST MEDICAL HISTORY:  Gout, history of cognitive impairment/mentally  challenged, history of alcohol use, dyslipidemia, urinary tract infection,  also new onset diabetes.   ADMISSION MEDICATIONS:  Allopurinol, Lovenox, insulin, Levaquin, Zocor,  Dilaudid p.r.n., Zofran p.r.n., oxycodone, p.r.n.   ALLERGIES:  None known.   FAMILY HISTORY:  Diabetes, end-stage renal disease.   SOCIAL HISTORY:  The patient lives in assisted living situation. She is  disabled. Again there are reports of the patient using  alcohol on the  weekends. No tobacco or illicit drug use.   REVIEW OF SYSTEMS:  No headaches reported. No focal neurological deficits.   PHYSICAL EXAMINATION:  GENERAL:  Shows an obese lady.  VITAL SIGNS:  Tmax 98.6, blood pressure 147/83, pulse 20, respirations 76.  HEENT:  Shows marked acute drama with hematoma and swelling involving the  right side of the tongue.  NECK:  Supple.  ABDOMEN:  Obese, soft.  EXTREMITIES:  No significant edema.  MENTATION:  The patient opens her eyes spontaneously. She is oriented x1.  She does follow commands on both sides. She has severe dysarthria because of  swelling of her tongue. She does speak in simple sentences. Cranial nerves  evaluation, pupils equal round and reactive to light and accommodation.  Visual fields are intact. Extraocular movements are full. Facial muscle  strength is symmetric. Tongue is midline. Shoulder shrugs are normal. Motor  examination shows antigravity strength in all four extremities. Reflexes are  preserved. Plantar reflexes are both upgoing. Sensation is normal to pain  and light touch.   Sodium 128, potassium 3.6, chloride  94, CO2 18, glucose 341, BUN 15,  creatinine 1.4. Calcium 9.0.   ASSESSMENT:  1. Resolving/resolved status epilepticus. The etiology is unclear.  2. Heavy alcohol use per history. This could contribute and may in fact be      the etiology of her seizures due to alcohol withdrawal seizures.  3. Cognitive impairment at baseline.  4. Obesity.  5. New onset diabetes mellitus.   RECOMMENDATIONS:  1. Check dilantin level stat and repeat in the morning.  2. EEG.  3. Head CT scan or MRI scan.   Thanks for this consultation.      Kofi A. Gerilyn Pilgrim, M.D.  Electronically Signed     KAD/MEDQ  D:  11/20/2005  T:  11/20/2005  Job:  [id number]

## 2010-08-03 NOTE — Group Therapy Note (Signed)
NAMEGRIER, CZERWINSKI            ACCOUNT NO.:  [id number]   MEDICAL RECORD NO.:  [id number]          PATIENT TYPE:  INP   LOCATION:  A213                          FACILITY:  [facility]   PHYSICIAN:  Angus G. [name], MD   DATE OF BIRTH:  23-Jan-1951   DATE OF PROCEDURE:  DATE OF DISCHARGE:                                   PROGRESS NOTE   This patient was admitted with uncontrolled diabetes, urinary tract  infection, pyelonephritis, nephrolithiasis with ureteral obstruction and  questionable pancreatic mass.  The patient had to be placed on Glucomander  for control of her blood sugars and sugars have come down some and she is  back on a sliding scale Humalog and Lantus insulin.  She had been seen by  Dr. Rito Ehrlich and he plans to intervene with reference to the kidney stone.   OBJECTIVE:  VITAL SIGNS:  Blood pressure 140/84, respiration 20, pulse 84,  temperature 97.6, blood sugar this morning was 214.  The patient continues  to complain of pain in left flank.  LUNGS:  Clear to P&A.  HEART:  Regular rhythm.  ABDOMEN:  No palpable organs or masses.   ASSESSMENT:  The patient was admitted with above-stated problems.  Her  sugars have improved.  She is back on sliding scale, will continue to adjust  the dose of Lantus insulin continue sliding scale.  The patient also being  seen by a urology with reference to her kidney stone.      Angus G. [name], MD  Electronically Signed     AGM/MEDQ  D:  11/18/2005  T:  11/18/2005  Job:  [id number]

## 2010-08-03 NOTE — H&P (Signed)
NAMEDYLANIE, [name]            ACCOUNT NO.:  [id number]   MEDICAL RECORD NO.:  [id number]          PATIENT TYPE:  INP   LOCATION:  A306                          FACILITY:  [facility]   PHYSICIAN:  Jennifer Hays. Dechurch, M.D.DATE OF BIRTH:  1950-11-07   DATE OF ADMISSION:  11/16/2005  DATE OF DISCHARGE:  LH                                HISTORY & PHYSICAL   HISTORY OF PRESENT ILLNESS:  The patient is a 60 year old African American  female, followed by Dr. Renard [name] with a past medical history of gout and  hyperlipidemia, who apparently is mentally challenged and lives in a, what  sounds like, a group home with her fiance who was present this evening.  In  any event, she was seen in the emergency room on November 11, 2005  with flank  pain and diagnosis of UTI.  Culture subsequently  has grown out multiple  bacteria, consistent with contamination.  In any event, she presented back  to the emergency room tonight complaining of same, and labs revealed a  glucose of 587.  She states she has known she has sugar but has not been  treated.  The patient is a very poor historian and relies on her significant  other to provide information, though he does not have much specific  information either.  In any event, she was complaining of significant flank  pain.  Her urinalysis does actually is pretty unremarkable.  However, CAT  scan, as reported by the emergency room physician, revealed a reported stone  at the left ureteropelvic junction,  which apparently was acting almost as  without it with intermittent blockage of the ureter.  As described, it is a  9 x 5 mm  intermittently obstructing left renal pelvic calculus.  There is  also question of a small mass in the tail of the pancreas, as well as  possibly Paget's over the left iliac bone.  In any event, given these  findings, low grade fever and pain,  the patient is being admitted to the  hospital for further evaluation and treatment.   PAST MEDICAL  HISTORY:  The patient is gravida 1, para 1, A 0.  She has  reported no surgeries.  She has not been hospitalized as far as she recalls.  She has a history of gout.   MEDICATIONS:  1. Lipitor 20 mg daily.  2. Allopurinol 100 mg daily.  3. Bactrim DS (which she has been taking since November 11, 2005).   ALLERGIES:  NO KNOWN DRUG ALLERGIES.   FAMILY MEDICAL HISTORY:  Pertinent for dialysis in her mother; apparently  heart disease and diabetes in some other family members.  She has one  surviving sister, though she cannot tell me any specifics regarding her  health.  She has a 59 year old border apparently who is alive and without  health problems.   SOCIAL HISTORY:  She lives in an assisted living-type situation.  She is  disabled.  No alcohol or tobacco abuse.  Denies any drug use.   REVIEW OF SYSTEMS:  Denies any GU complaints; specifically no vaginal  discharge or itching.  No pain, aside from the left flank pain which is  intermittent; but she appears to be quite uncomfortable.  Denies any nausea  or vomiting.  Denies any history of blood clots.  Denies any weight gain or  weight loss.  Normally has a good appetite.  Her friend accompanying her  describes her as being confused, or kind of  out there.[name]   PHYSICAL EXAM:  GENERAL:  Reveals an obese female who is alert.  Kind of an  unusual affect, but is fairly cooperative.  VITAL SIGNS:  Weight 239, temperature 100.3 orally, blood pressure 145/80,  pulse 92 and regular, respirations are unlabored.  O2 saturation on room air  is 97%.  HEENT:  Oropharynx is moist membranes.  Teeth are in fair repair.  NECK: Supple.  No JVD, adenopathy or thyromegaly.  LUNGS:  Diminished at the bases, but clear.  ABDOMEN:  Obese. soft.  She is somewhat of a difficult exam, as she keeps  moving on the bed.  EXTREMITIES: Without clubbing or cyanosis.  She has no edema.  Feet are warm  bilaterally.  Cannot really palpate good pulses.  NEUROLOGIC:  She  is alert; can answer some simple questions.  Slow to follow  commands; baseline according to her friend who is accompanying her.   LABORATORY DATA:  Today's UA is normal; reveals 7-10 white cells, few  bacteria and positive Trichomonas.  Her specific gravity is less than 1.005.  Hemoglobin 11.9, white count 8.1 with left shift, platelets 250.  Sodium  125, chloride 88, glucose 587, creatinine 1.5 with normal BUN.  Her UA on  November 12, 2005 revealed many squamous, 11-20 white cells and positive  bacteria; at that time her urine glucose was greater than 1000 but no labs  were done.   ASSESSMENT/PLAN:  1. Probable early pyelonephritis versus upper tract infection.  2. Nephrolithiasis with intermittently obstructing calculi  3. Diabetes mellitus, apparently previously not diagnosed.  4. Question of a pancreatic tail mass, based on the limited CT.  Will need      further evaluation.  5. History of gout.  6. Mental retardation.   The patient  is being admitted with some IV fluids.  She has received some  IV Levaquin which will be continued.  Will prophylax her with Lovenox.   For Trichomonas will go ahead and treat with Flagyl, as she is sexually  active.  Will hold on the CAT scan.  Further evaluation of her pancreas can  be done once her renal function stabilizes.  Sliding scale insulin and begin  Lantus.  Will need to assess her social status to see what kind of  assistance she will have in her abode.      [name], M.D.  Electronically Signed     FED/MEDQ  D:  11/16/2005  T:  11/16/2005  Job:  [id number]   cc:   Jennifer G. Jennifer Matter, MD  Fax: [phone]

## 2010-08-03 NOTE — Group Therapy Note (Signed)
[name], [name]            ACCOUNT NO.:  [id number]   MEDICAL RECORD NO.:  [id number]          PATIENT TYPE:  INP   LOCATION:  A214                          FACILITY:  [facility]   PHYSICIAN:  Angus G. [name], MD   DATE OF BIRTH:  1951-01-30   DATE OF PROCEDURE:  DATE OF DISCHARGE:                                   PROGRESS NOTE   SUBJECTIVE:  The patient was admitted with uncontrolled diabetes, urinary  tract infection, pyelonephritis, nephrolithiasis with ureteral obstruction,  questionable pancreatic mass. The patient did have a full blown grand mal  seizure yesterday and had seizure activity for most of the morning but she  has had no further seizures. An MRI was essentially negative. EEG was done  yesterday, report pending. The patient had Dilantin level done which was  44.8 and then subsequent level of 8.7. Her blood sugars have been in  acceptable range between 72 and 120.   OBJECTIVE:  VITAL SIGNS:  Blood pressure 127/65, heart rate 60.  LUNGS:  Clear to P&A.  ABDOMEN:  No palpable organs or masses.   ASSESSMENT:  The patient had status epilepticus new onset seizure disorder.  She does have new onset diabetes with elevated blood sugars but this has  been better. Does have underlying urinary tract infection, pyelonephritis,  nephrolithiasis.   PLAN:  Continue current regimen. Will reinstitute dilantin 200 mg b.i.d.,  continue current regimen otherwise. The patient could be moved out to 2A  __cooncentrated care________.      Angus G. [name], MD  Electronically Signed     AGM/MEDQ  D:  11/20/2005  T:  11/20/2005  Job:  [id number]

## 2010-10-08 ENCOUNTER — Ambulatory Visit (HOSPITAL_COMMUNITY)
Admission: RE | Admit: 2010-10-08 | Discharge: 2010-10-08 | Disposition: A | Discharge status: Home / Self Care | Payer: PRIVATE HEALTH INSURANCE | Source: Ambulatory Visit | Attending: Pulmonary Disease | Admitting: Pulmonary Disease

## 2010-10-08 ENCOUNTER — Other Ambulatory Visit (HOSPITAL_COMMUNITY): Payer: Self-pay | Admitting: Pulmonary Disease

## 2010-10-08 DIAGNOSIS — R609 Edema, unspecified: Secondary | ICD-10-CM

## 2010-10-08 DIAGNOSIS — M79609 Pain in unspecified limb: Secondary | ICD-10-CM | POA: Insufficient documentation

## 2010-10-08 DIAGNOSIS — M7989 Other specified soft tissue disorders: Secondary | ICD-10-CM | POA: Insufficient documentation

## 2010-12-31 ENCOUNTER — Ambulatory Visit (HOSPITAL_COMMUNITY): Payer: PRIVATE HEALTH INSURANCE | Admitting: Oncology

## 2010-12-31 ENCOUNTER — Other Ambulatory Visit (HOSPITAL_COMMUNITY): Payer: PRIVATE HEALTH INSURANCE

## 2011-06-22 ENCOUNTER — Encounter (HOSPITAL_COMMUNITY): Payer: Self-pay

## 2011-06-22 ENCOUNTER — Emergency Department (HOSPITAL_COMMUNITY)
Admission: EM | Admit: 2011-06-22 | Discharge: 2011-06-22 | Disposition: A | Discharge status: Home / Self Care | Payer: PRIVATE HEALTH INSURANCE | Attending: Emergency Medicine | Admitting: Emergency Medicine

## 2011-06-22 ENCOUNTER — Emergency Department (HOSPITAL_COMMUNITY): Payer: PRIVATE HEALTH INSURANCE

## 2011-06-22 DIAGNOSIS — M2550 Pain in unspecified joint: Secondary | ICD-10-CM | POA: Insufficient documentation

## 2011-06-22 DIAGNOSIS — R609 Edema, unspecified: Secondary | ICD-10-CM | POA: Insufficient documentation

## 2011-06-22 DIAGNOSIS — F79 Unspecified intellectual disabilities: Secondary | ICD-10-CM | POA: Insufficient documentation

## 2011-06-22 DIAGNOSIS — F329 Major depressive disorder, single episode, unspecified: Secondary | ICD-10-CM | POA: Insufficient documentation

## 2011-06-22 DIAGNOSIS — M79609 Pain in unspecified limb: Secondary | ICD-10-CM | POA: Insufficient documentation

## 2011-06-22 DIAGNOSIS — J45909 Unspecified asthma, uncomplicated: Secondary | ICD-10-CM | POA: Insufficient documentation

## 2011-06-22 DIAGNOSIS — S60229A Contusion of unspecified hand, initial encounter: Secondary | ICD-10-CM | POA: Insufficient documentation

## 2011-06-22 DIAGNOSIS — Y92009 Unspecified place in unspecified non-institutional (private) residence as the place of occurrence of the external cause: Secondary | ICD-10-CM | POA: Insufficient documentation

## 2011-06-22 DIAGNOSIS — W2203XA Walked into furniture, initial encounter: Secondary | ICD-10-CM | POA: Insufficient documentation

## 2011-06-22 DIAGNOSIS — F3289 Other specified depressive episodes: Secondary | ICD-10-CM | POA: Insufficient documentation

## 2011-06-22 DIAGNOSIS — M7989 Other specified soft tissue disorders: Secondary | ICD-10-CM | POA: Insufficient documentation

## 2011-06-22 DIAGNOSIS — I10 Essential (primary) hypertension: Secondary | ICD-10-CM | POA: Insufficient documentation

## 2011-06-22 DIAGNOSIS — E119 Type 2 diabetes mellitus without complications: Secondary | ICD-10-CM | POA: Insufficient documentation

## 2011-06-22 HISTORY — DX: Major depressive disorder, single episode, unspecified: F32.9

## 2011-06-22 HISTORY — DX: Essential (primary) hypertension: I10

## 2011-06-22 HISTORY — DX: Unspecified convulsions: R56.9

## 2011-06-22 HISTORY — DX: Unspecified intellectual disabilities: F79

## 2011-06-22 HISTORY — DX: Depression, unspecified: F32.A

## 2011-06-22 LAB — GLUCOSE, CAPILLARY: Glucose-Capillary: 110 mg/dL — ABNORMAL HIGH (ref 70–99)

## 2011-06-22 MED ORDER — CEPHALEXIN 500 MG PO CAPS: 500.0000 mg | ORAL_CAPSULE | Freq: Four times a day (QID) | ORAL | Status: AC

## 2011-06-22 MED ORDER — CEPHALEXIN 500 MG PO CAPS
500.0000 mg | ORAL_CAPSULE | Freq: Once | ORAL | Status: AC
  Administered 2011-06-22: 500 mg via ORAL
  Filled 2011-06-22: qty 1

## 2011-06-22 NOTE — ED Provider Notes (Signed)
History     CSN: [id number]  Arrival date & time 06/22/11  1733   First MD Initiated Contact with Patient 06/22/11 1741      Chief Complaint  Patient presents with  . Finger Injury    (Consider location/radiation/quality/duration/timing/severity/associated sxs/prior treatment) HPI Comments: Patient presents with right index finger swelling and pain.  She comes from a group home and has a history of MR and therefore is not completely reliable historian.  She believes she may have hit her finger on a piece of furniture in her room, but is unsure.  She states she noticed the swelling when she got out of the shower today.  Patient is a 61 y.o. female presenting with hand pain. The history is provided by the patient.  Hand Pain This is a new problem. The current episode started today. The problem has been unchanged. Associated symptoms include arthralgias. Pertinent negatives include no abdominal pain, chest pain, chills, congestion, fever, headaches, joint swelling, nausea, neck pain, numbness, rash, sore throat or weakness. Exacerbated by: Palpation and range of motion makes worse. She has tried nothing for the symptoms.    Past Medical History  Diagnosis Date  . Hypertension   . Diabetes mellitus   . Seizures   . Asthma   . Depression   . Mental retardation     History reviewed. No pertinent past surgical history.  No family history on file.  History  Substance Use Topics  . Smoking status: Never Smoker   . Smokeless tobacco: Not on file  . Alcohol Use: No    OB History    Grav Para Term Preterm Abortions TAB SAB Ect Mult Living                  Review of Systems  Constitutional: Negative for fever and chills.  HENT: Negative for congestion, sore throat and neck pain.   Eyes: Negative.   Respiratory: Negative for chest tightness and shortness of breath.   Cardiovascular: Negative for chest pain.  Gastrointestinal: Negative for nausea and abdominal pain.    Genitourinary: Negative.   Musculoskeletal: Positive for arthralgias. Negative for joint swelling.  Skin: Negative.  Negative for rash and wound.  Neurological: Negative for dizziness, weakness, light-headedness, numbness and headaches.  Hematological: Negative.   Psychiatric/Behavioral: Negative.     Allergies  Review of patient's allergies indicates no known allergies.  Home Medications   Current Outpatient Rx  Name Route Sig Dispense Refill  . CEPHALEXIN 500 MG PO CAPS Oral Take 1 capsule (500 mg total) by mouth 4 (four) times daily. 40 capsule 0    BP 157/81  Pulse 104  Temp(Src) 97.8 F (36.6 C) (Oral)  Resp 20  Ht 5\' 4"  (1.626 m)  Wt 280 lb (127.007 kg)  BMI 48.06 kg/m2  Physical Exam  Nursing note and vitals reviewed. Constitutional: She is oriented to person, place, and time. She appears well-developed and well-nourished.  HENT:  Head: Normocephalic.  Eyes: Conjunctivae are normal.  Neck: Normal range of motion.  Cardiovascular: Normal rate and intact distal pulses.  Exam reveals no decreased pulses.   Pulses:      Dorsalis pedis pulses are 2+ on the right side, and 2+ on the left side.       Posterior tibial pulses are 2+ on the right side, and 2+ on the left side.  Pulmonary/Chest: Effort normal.  Musculoskeletal: She exhibits edema and tenderness.       Right ankle: She exhibits decreased range of  motion, swelling and ecchymosis. She exhibits normal pulse. tenderness. Lateral malleolus and CF ligament tenderness found. No head of 5th metatarsal and no proximal fibula tenderness found. Achilles tendon normal.       Hands:      Erythema and slight edema of the PIP joint of right index finger.  Distal sensation intact, less than 2 second cap refill.  Warm to touch.  No obvious punctures, abrasions or skin lesions.  Neurological: She is alert and oriented to person, place, and time. No sensory deficit.  Skin: Skin is warm, dry and intact.    ED Course   Procedures (including critical care time)  Labs Reviewed  GLUCOSE, CAPILLARY - Abnormal; Notable for the following:    Glucose-Capillary 110 (*)    All other components within normal limits   Dg Finger Index Right  06/22/2011  *RADIOLOGY REPORT*  Clinical Data: Pain and swelling  RIGHT INDEX FINGER 2+V  Comparison: 09/18/2009  Findings: Soft tissue swelling around the PIP joint.  Negative for fracture or foreign body.  Negative for erosion.  IMPRESSION: Soft tissue swelling.  No fracture or erosion is identified.  Original Report Authenticated By: Camelia Phenes, M.D.     1. Swelling of finger       MDM  Swelling of right index finger with erythema and redness of unclear etiology.  This could possibly be from trauma, with patient's history of diabetes cannot rule out possibility of skin infection.  Will cover with Keflex and also recommend Tylenol if needed for pain relief, ice and elevation as well.  Recheck by PCP in 2 days if not improving.        Candis Musa, PA 06/22/11 1925

## 2011-06-22 NOTE — ED Notes (Signed)
Pt a/ox3. Resp even and unlabored. NAD at this time.D/C instructions reviewed with pt. Pt verbalized understanding. Pt ambulated to lobby with steady gate.

## 2011-06-22 NOTE — ED Notes (Signed)
Pt presents with right index finger pain and swelling. Pt with MR and is unable to tell me if she injured it. Pt states swelling started after taking a shower today.

## 2011-06-22 NOTE — Discharge Instructions (Signed)
Use tylenol if needed for pain relief,  Ice and elevation will also help with the swelling and pain.  Take the entire course of the antibiotic as your pain and swelling may be due to an infection in your finger.  See your doctor for a recheck if your finger is not getting better.  Your xray is negative for any injury to the finger.

## 2011-06-23 NOTE — ED Provider Notes (Signed)
Medical screening examination/treatment/procedure(s) were performed by non-physician practitioner and as supervising physician I was immediately available for consultation/collaboration.    Glynn Octave, MD 06/23/11 1444

## 2012-08-26 ENCOUNTER — Other Ambulatory Visit (HOSPITAL_COMMUNITY): Payer: Self-pay | Admitting: Pulmonary Disease

## 2012-09-03 ENCOUNTER — Other Ambulatory Visit (HOSPITAL_COMMUNITY): Payer: Self-pay | Admitting: Pulmonary Disease

## 2012-09-04 ENCOUNTER — Other Ambulatory Visit (HOSPITAL_COMMUNITY): Payer: Self-pay | Admitting: Pulmonary Disease

## 2012-09-04 DIAGNOSIS — R945 Abnormal results of liver function studies: Secondary | ICD-10-CM

## 2012-09-08 ENCOUNTER — Ambulatory Visit (HOSPITAL_COMMUNITY)
Admission: RE | Admit: 2012-09-08 | Discharge: 2012-09-08 | Disposition: A | Discharge status: Home / Self Care | Payer: PRIVATE HEALTH INSURANCE | Source: Ambulatory Visit | Attending: Pulmonary Disease | Admitting: Pulmonary Disease

## 2012-09-08 DIAGNOSIS — R945 Abnormal results of liver function studies: Secondary | ICD-10-CM

## 2012-09-08 DIAGNOSIS — R7989 Other specified abnormal findings of blood chemistry: Secondary | ICD-10-CM | POA: Insufficient documentation

## 2012-09-08 DIAGNOSIS — N1330 Unspecified hydronephrosis: Secondary | ICD-10-CM | POA: Insufficient documentation

## 2012-09-17 ENCOUNTER — Encounter (INDEPENDENT_AMBULATORY_CARE_PROVIDER_SITE_OTHER): Payer: Self-pay | Admitting: *Deleted

## 2012-09-24 ENCOUNTER — Encounter (HOSPITAL_COMMUNITY): Payer: Self-pay | Admitting: Pharmacist

## 2012-09-30 ENCOUNTER — Encounter (HOSPITAL_COMMUNITY)
Admission: RE | Admit: 2012-09-30 | Discharge: 2012-09-30 | Disposition: A | Discharge status: Home / Self Care | Payer: Medicare Other | Source: Ambulatory Visit | Attending: Oral Surgery | Admitting: Oral Surgery

## 2012-09-30 ENCOUNTER — Ambulatory Visit (HOSPITAL_COMMUNITY)
Admission: RE | Admit: 2012-09-30 | Discharge: 2012-09-30 | Disposition: A | Discharge status: Home / Self Care | Payer: Medicare Other | Source: Ambulatory Visit | Attending: Anesthesiology | Admitting: Anesthesiology

## 2012-09-30 ENCOUNTER — Encounter (HOSPITAL_COMMUNITY): Payer: Self-pay

## 2012-09-30 DIAGNOSIS — I10 Essential (primary) hypertension: Secondary | ICD-10-CM | POA: Insufficient documentation

## 2012-09-30 DIAGNOSIS — E119 Type 2 diabetes mellitus without complications: Secondary | ICD-10-CM | POA: Insufficient documentation

## 2012-09-30 DIAGNOSIS — Z01818 Encounter for other preprocedural examination: Secondary | ICD-10-CM | POA: Insufficient documentation

## 2012-09-30 DIAGNOSIS — M47814 Spondylosis without myelopathy or radiculopathy, thoracic region: Secondary | ICD-10-CM | POA: Insufficient documentation

## 2012-09-30 DIAGNOSIS — J45909 Unspecified asthma, uncomplicated: Secondary | ICD-10-CM | POA: Insufficient documentation

## 2012-09-30 DIAGNOSIS — Z01812 Encounter for preprocedural laboratory examination: Secondary | ICD-10-CM | POA: Insufficient documentation

## 2012-09-30 HISTORY — DX: Personal history of urinary (tract) infections: Z87.440

## 2012-09-30 LAB — BASIC METABOLIC PANEL
Chloride: 101 mEq/L (ref 96–112)
GFR calc Af Amer: 64 mL/min — ABNORMAL LOW (ref >90)
Potassium: 5 mEq/L (ref 3.5–5.1)

## 2012-09-30 LAB — CBC
HCT: 30.1 % — ABNORMAL LOW (ref 36.0–46.0)
Hemoglobin: 9.6 g/dL — ABNORMAL LOW (ref 12.0–15.0)
WBC: 7.4 10*3/uL (ref 4.0–10.5)

## 2012-09-30 NOTE — Pre-Procedure Instructions (Addendum)
[name]  09/30/2012   Your procedure is scheduled on:  10/05/12  Report to [id number] AM.  Call this number if you have problems the morning of surgery: [phone]   Remember:   Do not eat food or drink liquids after midnight.   Take these medicines the morning of surgery with A SIP OF WATER: advair, metoprolol            STOP aspirin, fish oil now   Do not wear jewelry, make-up or nail polish.  Do not wear lotions, powders, or perfumes. You may wear deodorant.  Do not shave 48 hours prior to surgery. Men may shave face and neck.  Do not bring valuables to the hospital.  [facility] is not responsible                   for any belongings or valuables.  Contacts, dentures or bridgework may not be worn into surgery.  Leave suitcase in the car. After surgery it may be brought to your room.  For patients admitted to the hospital, checkout time is 11:00 AM the day of  discharge.   Patients discharged the day of surgery will not be allowed to drive  home.  Name and phone number of your driver:   Special Instructions: Shower using CHG 2 nights before surgery and the night before surgery.  If you shower the day of surgery use CHG.  Use special wash - you have one bottle of CHG for all showers.  You should use approximately 1/3 of the bottle for each shower.   Please read over the following fact sheets that you were given: Pain Booklet, Coughing and Deep Breathing and Surgical Site Infection Prevention

## 2012-10-01 ENCOUNTER — Encounter (HOSPITAL_COMMUNITY): Payer: Self-pay | Admitting: Vascular Surgery

## 2012-10-01 NOTE — Progress Notes (Addendum)
Anesthesia chart review:  Patient is a 62 year old female scheduled for multiple teeth extractions on 10/05/12 by Dr. Barbette Merino.  Case is posted for [facility].  History includes morbid obesity (BMI ~ 42), DM2, HTN, asthma, seizures (on Dilantin), mental retardation, recurrent UTIs, hysterectomy, former smoker.  PCP is listed as Dr. Kari Baars who cleared patient for this procedure.  She is a resident at [facility] in [place] [phone]). Epic indicates that she is scheduled to see GI in August 2014 for elevated LFTs.   EKG on 09/30/12 showed NSR, cannot rule out anterior infarct (age undetermined).  Echo on 03/08/08 showed: - Overall left ventricular systolic function was vigorous. Left ventricular ejection fraction was estimated to be 65 %. Left ventricular wall thickness was moderately increased. - The aortic valve was mildly calcified. Mild tricuspid valve regurgitation. - Small hyperdynamic ventricle with near cavity obliteration in the mid-LV during systole. Consider beta blockade and hydration if clinically indicated.  Abdominal ultrasound on 09/08/12 showed: Diffusely heterogeneous liver compatible with diffuse hepatic parenchymal disease. No focal mass. Mild right hydronephrosis of unknown significance. Chronic scarring and atrophy of the left kidney are not significantly changed compared the prior CT.   CXR on 09/30/12 showed: No active disease. Stable elevation of the right hemidiaphragm.  Preoperative labs noted.  Cr 1.06, glucose 65, H/H 9.6/30.1. With her history of elevated LFTs, I tried to add HFP to labs already drawn, but was unable.  I have requested her latest labs from Dr. Juanetta Gosling from 07/2012.   I've reviewed currently available information with anesthesiologist Dr. Krista Blue, and will follow-up with additional lab results from Dr. Adah Perl office once received.  Velna Ochs [facility]/Anesthesiology Phone [phone] 10/01/2012  4:07 PM  Addendum: 10/01/12 5:20 PM I received most recent lab results from Dr. Juanetta Gosling.  On 08/12/12 her Alk Phos was elevated at 286, but AST/ALT were normal at 12/10.  Her A1C was 6.6.  Since her AST/ALT were WNL and PCP has cleared her for surgery, I think she can proceed with dental extractions with repeating HFP preoperatively. Further evaluation can be done at her GI appointment next month. Anesthesiologist Dr. Noreene Larsson agrees.

## 2012-10-01 NOTE — H&P (Signed)
HISTORY AND PHYSICAL  [name] is a 62 y.o. female patient with CC: Painful teeth  No diagnosis found.  Past Medical History  Diagnosis Date  . Hypertension   . Diabetes mellitus   . Seizures   . Asthma   . Depression   . Mental retardation   . History of recurrent UTIs     No current facility-administered medications for this encounter.   Current Outpatient Prescriptions  Medication Sig Dispense Refill  . aspirin EC 81 MG tablet Take 81 mg by mouth daily.      . fish oil-omega-3 fatty acids 1000 MG capsule Take 1 g by mouth 2 (two) times daily.      . Fluticasone-Salmeterol (ADVAIR) 250-50 MCG/DOSE AEPB Inhale 1 puff into the lungs every 12 (twelve) hours.      . Insulin Glargine (LANTUS SOLOSTAR) 100 UNIT/ML SOPN Inject 35 Units into the skin at bedtime.      [name] lisinopril (PRINIVIL,ZESTRIL) 40 MG tablet Take 40 mg by mouth daily.      [name] loratadine-pseudoephedrine (LORATADINE-D 24HR) 10-240 MG per 24 hr tablet Take 1 tablet by mouth daily.      . metFORMIN (GLUCOPHAGE) 1000 MG tablet Take 1,000 mg by mouth 2 (two) times daily with a meal.      . metoprolol succinate (TOPROL-XL) 50 MG 24 hr tablet Take 100 mg by mouth at bedtime. Take with or immediately following a meal.      . montelukast (SINGULAIR) 10 MG tablet Take 10 mg by mouth at bedtime.      . Naftifine HCl (NAFTIN) 1 % GEL Apply 1 application topically at bedtime. Apply to feet at bedtime after removing support stockings      . phenytoin (DILANTIN) 100 MG ER capsule Take 100 mg by mouth 2 (two) times daily.      . pravastatin (PRAVACHOL) 40 MG tablet Take 40 mg by mouth daily.       No Known Allergies Active Problems:   * No active hospital problems. *  Vitals: There were no vitals taken for this visit. Lab results:No results found for this or any previous visit (from the past 24 hour(s)). Radiology Results: Dg Chest 2 View  09/30/2012   *RADIOLOGY REPORT*  Clinical Data: Preoperative respiratory exam for  dental surgery. History of asthma, hypertension and diabetes.  CHEST - 2 VIEW  Comparison: 12/18/2006  Findings: There is stable elevation of the right hemidiaphragm.  No infiltrates, edema, nodules or pleural effusions are seen.  The heart size and mediastinal contours are unremarkable.  Bony thorax shows mild degenerative changes of the thoracic spine.  IMPRESSION: No active disease.  Stable elevation of the right hemidiaphragm.   Original Report Authenticated By: Irish Lack, M.D.   General appearance: cooperative and morbidly obese Head: Normocephalic, without obvious abnormality, atraumatic Eyes: negative Ears: normal TM's and external ear canals both ears Nose: Nares normal. Septum midline. Mucosa normal. No drainage or sinus tenderness. Throat: Dental caries, bone loss, mobile teeth Neck: no adenopathy, supple, symmetrical, trachea midline and thyroid not enlarged, symmetric, no tenderness/mass/nodules Resp: clear to auscultation bilaterally Cardio: regular rate and rhythm, S1, S2 normal, no murmur, click, rub or gallop  Assessment: 62 BF Obesity, HTN, DM, Sz, Asthma, Depression, MR with non-restorable teeth due to caries, generalized chronic periodontal disease.  Plan: Multiple extractions, alveoloplasty. General anesthesia. Day surgery.   Georgia Lopes 10/01/2012

## 2012-10-04 MED ORDER — CEFAZOLIN SODIUM-DEXTROSE 2-3 GM-% IV SOLR
2.0000 g | INTRAVENOUS | Status: DC
  Filled 2012-10-04: qty 50

## 2012-10-05 ENCOUNTER — Ambulatory Visit (HOSPITAL_COMMUNITY)
Admission: RE | Admit: 2012-10-05 | Discharge: 2012-10-05 | Disposition: A | Discharge status: Home / Self Care | Payer: Medicaid Other | Source: Ambulatory Visit | Attending: Oral Surgery | Admitting: Oral Surgery

## 2012-10-05 ENCOUNTER — Encounter (HOSPITAL_COMMUNITY)
Admission: RE | Disposition: A | Discharge status: Home / Self Care | Payer: Self-pay | Source: Ambulatory Visit | Attending: Oral Surgery

## 2012-10-05 ENCOUNTER — Encounter (HOSPITAL_COMMUNITY): Payer: Self-pay | Admitting: *Deleted

## 2012-10-05 DIAGNOSIS — Z79899 Other long term (current) drug therapy: Secondary | ICD-10-CM | POA: Insufficient documentation

## 2012-10-05 DIAGNOSIS — I10 Essential (primary) hypertension: Secondary | ICD-10-CM | POA: Insufficient documentation

## 2012-10-05 DIAGNOSIS — Z7982 Long term (current) use of aspirin: Secondary | ICD-10-CM | POA: Insufficient documentation

## 2012-10-05 DIAGNOSIS — F329 Major depressive disorder, single episode, unspecified: Secondary | ICD-10-CM | POA: Insufficient documentation

## 2012-10-05 DIAGNOSIS — Z87440 Personal history of urinary (tract) infections: Secondary | ICD-10-CM | POA: Insufficient documentation

## 2012-10-05 DIAGNOSIS — K029 Dental caries, unspecified: Secondary | ICD-10-CM | POA: Insufficient documentation

## 2012-10-05 DIAGNOSIS — Z794 Long term (current) use of insulin: Secondary | ICD-10-CM | POA: Insufficient documentation

## 2012-10-05 DIAGNOSIS — Z538 Procedure and treatment not carried out for other reasons: Secondary | ICD-10-CM | POA: Insufficient documentation

## 2012-10-05 DIAGNOSIS — F79 Unspecified intellectual disabilities: Secondary | ICD-10-CM | POA: Insufficient documentation

## 2012-10-05 DIAGNOSIS — F3289 Other specified depressive episodes: Secondary | ICD-10-CM | POA: Insufficient documentation

## 2012-10-05 DIAGNOSIS — G40909 Epilepsy, unspecified, not intractable, without status epilepticus: Secondary | ICD-10-CM | POA: Insufficient documentation

## 2012-10-05 DIAGNOSIS — E119 Type 2 diabetes mellitus without complications: Secondary | ICD-10-CM | POA: Insufficient documentation

## 2012-10-05 DIAGNOSIS — J45909 Unspecified asthma, uncomplicated: Secondary | ICD-10-CM | POA: Insufficient documentation

## 2012-10-05 DIAGNOSIS — K056 Periodontal disease, unspecified: Secondary | ICD-10-CM | POA: Insufficient documentation

## 2012-10-05 DIAGNOSIS — E669 Obesity, unspecified: Secondary | ICD-10-CM | POA: Insufficient documentation

## 2012-10-05 SURGERY — CANCELLED PROCEDURE

## 2012-10-05 SURGICAL SUPPLY — 27 items
BUR CROSS CUT FISSURE 1.6 (BURR) ×3 IMPLANT
BUR EGG ELITE 4.0 (BURR) IMPLANT
CANISTER SUCTION 2500CC (MISCELLANEOUS) ×3 IMPLANT
CLOTH BEACON ORANGE TIMEOUT ST (SAFETY) ×3 IMPLANT
COVER SURGICAL LIGHT HANDLE (MISCELLANEOUS) ×3 IMPLANT
CRADLE DONUT ADULT HEAD (MISCELLANEOUS) ×3 IMPLANT
DECANTER SPIKE VIAL GLASS SM (MISCELLANEOUS) ×3 IMPLANT
GAUZE PACKING FOLDED 2  STR (GAUZE/BANDAGES/DRESSINGS) ×1
GAUZE PACKING FOLDED 2 STR (GAUZE/BANDAGES/DRESSINGS) ×2 IMPLANT
GLOVE BIO SURGEON STRL SZ 6.5 (GLOVE) ×3 IMPLANT
GLOVE BIO SURGEON STRL SZ7.5 (GLOVE) ×3 IMPLANT
GLOVE BIOGEL PI IND STRL 7.0 (GLOVE) ×2 IMPLANT
GLOVE BIOGEL PI INDICATOR 7.0 (GLOVE) ×1
GOWN STRL NON-REIN LRG LVL3 (GOWN DISPOSABLE) ×3 IMPLANT
GOWN STRL REIN XL XLG (GOWN DISPOSABLE) ×3 IMPLANT
KIT BASIN OR (CUSTOM PROCEDURE TRAY) ×3 IMPLANT
KIT ROOM TURNOVER OR (KITS) ×3 IMPLANT
NEEDLE 22X1 1/2 (OR ONLY) (NEEDLE) ×3 IMPLANT
NS IRRIG 1000ML POUR BTL (IV SOLUTION) ×3 IMPLANT
PAD ARMBOARD 7.5X6 YLW CONV (MISCELLANEOUS) ×6 IMPLANT
SUT CHROMIC 3 0 PS 2 (SUTURE) ×3 IMPLANT
SYR CONTROL 10ML LL (SYRINGE) ×3 IMPLANT
TOWEL OR 17X26 10 PK STRL BLUE (TOWEL DISPOSABLE) ×3 IMPLANT
TRAY ENT MC OR (CUSTOM PROCEDURE TRAY) ×3 IMPLANT
TUBING IRRIGATION (MISCELLANEOUS) IMPLANT
WATER STERILE IRR 1000ML POUR (IV SOLUTION) IMPLANT
YANKAUER SUCT BULB TIP NO VENT (SUCTIONS) ×3 IMPLANT

## 2012-10-05 NOTE — Progress Notes (Signed)
Ms. Ivette Loyal from [facility] called back and confirmed that [name] did eat at 8am. I notified Dr. Ivin Booty of this and he stated that the earliest that [name] could have the surgery today would be 2 pm, preferably 3 pm. I then called Dr. Chipper Herb office and notified him and his office receptionist gave him the message and stated that he said surgery would be cancelled for today and scheduled for another day. I've notified Ms Ivette Loyal and she will send someone to pick [name] up.

## 2012-10-05 NOTE — Progress Notes (Signed)
While working patient up this am for surgery, pt stated that she has had coffee, milk, juice and cereal this morning. She also states she was not given any of her medicines this am, but the [facility] from the facility that she lives at shows that she was given her 8 am meds.  I called and spoke with a Boris Sharper from [facility] and she states that pt wasn't supposed to eat or have her meds. I asked her if she was there this am and she states another caretaker (?nurse) was and knew not to do either one. She states she will try and get in touch with this person.   I've notified Dr. Ivin Booty, anesthesiologist and the front desk in the OR of the possibility that pt has eaten this am.

## 2012-10-20 ENCOUNTER — Encounter (INDEPENDENT_AMBULATORY_CARE_PROVIDER_SITE_OTHER): Payer: Self-pay | Admitting: Internal Medicine

## 2012-10-20 ENCOUNTER — Ambulatory Visit (INDEPENDENT_AMBULATORY_CARE_PROVIDER_SITE_OTHER): Payer: Medicare Other | Admitting: Internal Medicine

## 2012-10-20 VITALS — BP 180/70 | HR 84 | Temp 98.0°F | Ht 60.0 in | Wt 239.1 lb

## 2012-10-20 NOTE — Patient Instructions (Addendum)
Hepatic profile today. If normal will repeat in 3 months. If abnormal today, will hold Pravachol and repeat in 1 month.

## 2012-10-20 NOTE — Progress Notes (Signed)
Subjective:     Patient ID: [name], female   DOB: 10/10/1950, 62 y.o.   MRN: [id number] Please note patient is mental retarded. HPI Referred to our office by Dr. Juanetta Gosling for elevated liver enzymes. Noted 08/13/2012 ALP 286.  Appetite is good. No weight loss. She denies prior hx of elevated liver enzymes. She has a BM x 1 day. No rectal bleeding or melena.  She denies prior hx of jaundice. No tattoos. NO IV drugs. ALP has been elevated as far back as 2008. Hepatitis A,B,C markers were negative in 2010. HIV was non-reactive 08/13/2012  ALP 286, AST 12, ALT 10.   09/04/2012 US liver: IMPRESSION:  Diffusely heterogeneous liver compatible with diffuse hepatic  parenchymal disease. No focal mass.  Review of Systems See hpi  Current Outpatient Prescriptions  Medication Sig Dispense Refill  . aspirin EC 81 MG tablet Take 81 mg by mouth daily.      . fish oil-omega-3 fatty acids 1000 MG capsule Take 1 g by mouth 2 (two) times daily.      . Fluticasone-Salmeterol (ADVAIR) 250-50 MCG/DOSE AEPB Inhale 1 puff into the lungs every 12 (twelve) hours.      . Insulin Glargine (LANTUS SOLOSTAR) 100 UNIT/ML SOPN Inject 35 Units into the skin at bedtime.      [name] lisinopril (PRINIVIL,ZESTRIL) 40 MG tablet Take 40 mg by mouth daily.      [name] loratadine-pseudoephedrine (LORATADINE-D 24HR) 10-240 MG per 24 hr tablet Take 1 tablet by mouth daily.      . metFORMIN (GLUCOPHAGE) 1000 MG tablet Take 1,000 mg by mouth 2 (two) times daily with a meal.      . metoprolol succinate (TOPROL-XL) 50 MG 24 hr tablet Take 100 mg by mouth at bedtime. Take with or immediately following a meal.      . montelukast (SINGULAIR) 10 MG tablet Take 10 mg by mouth at bedtime.      . Naftifine HCl (NAFTIN) 1 % GEL Apply 1 application topically at bedtime. Apply to feet at bedtime after removing support stockings      . phenytoin (DILANTIN) 100 MG ER capsule Take 100 mg by mouth 2 (two) times daily.      . pravastatin (PRAVACHOL)  40 MG tablet Take 40 mg by mouth daily.       No current facility-administered medications for this visit.   Past Medical History  Diagnosis Date  . Hypertension   . Diabetes mellitus   . Seizures   . Asthma   . Depression   . Mental retardation   . History of recurrent UTIs    Past Surgical History  Procedure Laterality Date  . Abdominal hysterectomy          Objective:   Physical Exam  Filed Vitals:   10/20/12 1515  BP: 180/70  Pulse: 84  Temp: 98 F (36.7 C)  Height: 5' (1.524 m)  Weight: 239 lb 1.6 oz (108.455 kg)   Alert and oriented. Skin warm and dry. Oral mucosa is moist.   . Sclera anicteric, conjunctivae is pink. Thyroid not enlarged. No cervical lymphadenopathy. Lungs clear. Heart regular rate and rhythm.  Abdomen is soft. Bowel sounds are positive. No hepatomegaly. No abdominal masses felt. No tenderness. Obese.  Edema to lower extremities.       Assessment:     Elevated ALP. Possibly related to the Pravachol.     Plan:    Will repeat a liver profile. If normal: will repeat in 3 months.  If still elevated will hold the Pravachol.

## 2012-10-21 LAB — HEPATIC FUNCTION PANEL
ALT: 9 U/L (ref 0–35)
Albumin: 3.5 g/dL (ref 3.5–5.2)
Total Protein: 6.5 g/dL (ref 6.0–8.3)

## 2012-10-22 ENCOUNTER — Telehealth (INDEPENDENT_AMBULATORY_CARE_PROVIDER_SITE_OTHER): Payer: Self-pay | Admitting: *Deleted

## 2012-10-22 ENCOUNTER — Telehealth (INDEPENDENT_AMBULATORY_CARE_PROVIDER_SITE_OTHER): Payer: Self-pay | Admitting: Internal Medicine

## 2012-10-22 NOTE — Telephone Encounter (Signed)
It is needed to discuss these requested labs with Dorene Ar, Np on 08/08/014

## 2012-10-23 ENCOUNTER — Telehealth (INDEPENDENT_AMBULATORY_CARE_PROVIDER_SITE_OTHER): Payer: Self-pay | Admitting: *Deleted

## 2012-10-23 NOTE — Telephone Encounter (Signed)
These labs are requested by Dorene Ar, NP for 1 month (11-23-12). Patient will be sent a letter to remind her.

## 2012-10-27 ENCOUNTER — Telehealth (INDEPENDENT_AMBULATORY_CARE_PROVIDER_SITE_OTHER): Payer: Self-pay | Admitting: *Deleted

## 2012-10-27 NOTE — Telephone Encounter (Signed)
Christy from [facility] needs to talk to you about the medicine you wanted Ms Poirier to hold for 30 days, please call her at [phone]

## 2012-10-28 NOTE — Progress Notes (Signed)
LM for director to return the call to schedule a f/u apt for [employer].

## 2012-10-28 NOTE — Progress Notes (Signed)
Apt has been scheduled for 12/02/12 with Dorene Ar, NP.

## 2012-10-28 NOTE — Telephone Encounter (Signed)
[name] will bring order sheet so I can stop the Atorvastatin. She will need an OV in 1 month.

## 2012-11-05 ENCOUNTER — Encounter (INDEPENDENT_AMBULATORY_CARE_PROVIDER_SITE_OTHER): Payer: Self-pay

## 2012-11-11 ENCOUNTER — Encounter (INDEPENDENT_AMBULATORY_CARE_PROVIDER_SITE_OTHER): Payer: Self-pay | Admitting: *Deleted

## 2012-11-11 ENCOUNTER — Other Ambulatory Visit (INDEPENDENT_AMBULATORY_CARE_PROVIDER_SITE_OTHER): Payer: Self-pay | Admitting: *Deleted

## 2012-11-13 ENCOUNTER — Encounter (HOSPITAL_COMMUNITY): Payer: Self-pay | Admitting: Pharmacy Technician

## 2012-11-18 ENCOUNTER — Encounter (HOSPITAL_COMMUNITY): Payer: Self-pay

## 2012-11-18 ENCOUNTER — Encounter (HOSPITAL_COMMUNITY)
Admission: RE | Admit: 2012-11-18 | Discharge: 2012-11-18 | Disposition: A | Discharge status: Home / Self Care | Payer: Medicare Other | Source: Ambulatory Visit | Attending: Oral Surgery | Admitting: Oral Surgery

## 2012-11-18 DIAGNOSIS — Z01812 Encounter for preprocedural laboratory examination: Secondary | ICD-10-CM | POA: Insufficient documentation

## 2012-11-18 DIAGNOSIS — Z01818 Encounter for other preprocedural examination: Secondary | ICD-10-CM | POA: Insufficient documentation

## 2012-11-18 HISTORY — DX: Shortness of breath: R06.02

## 2012-11-18 LAB — BASIC METABOLIC PANEL
CO2: 25 mEq/L (ref 19–32)
Chloride: 99 mEq/L (ref 96–112)
GFR calc Af Amer: 69 mL/min — ABNORMAL LOW (ref >90)
Sodium: 136 mEq/L (ref 135–145)

## 2012-11-18 LAB — CBC
Platelets: 303 10*3/uL (ref 150–400)
RBC: 3.7 MIL/uL — ABNORMAL LOW (ref 3.87–5.11)
RDW: 14 % (ref 11.5–15.5)
WBC: 8.4 10*3/uL (ref 4.0–10.5)

## 2012-11-18 NOTE — Pre-Procedure Instructions (Signed)
[name]  11/18/2012   Your procedure is scheduled on:  November 23, 2012 at 7:30 AM   Report to [facility] at 5:30 AM.  Call this number if you have problems the morning of surgery: [phone]   Remember:   Do not eat food or drink liquids after midnight.   Take these medicines the morning of surgery with A SIP OF WATER: metoprolol succinate (TOPROL-XL),  phenytoin (DILANTIN), Fluticasone-Salmeterol (ADVAIR)  Stop all Vitamins, Herbal medications, Fish Oil, Aspirin and Non-steroidals (Ibuprofen, Motrin, Aleve, Naproxen, etc) as of today, 11/18/12.   Do not wear jewelry, make-up or nail polish.  Do not wear lotions, powders, or perfumes. You may wear deodorant.  Do not shave 48 hours prior to surgery. Men may shave face and neck.  Do not bring valuables to the hospital.  [facility] is not responsible                   for any belongings or valuables.  Contacts, dentures or bridgework may not be worn into surgery.  Leave suitcase in the car. After surgery it may be brought to your room.  For patients admitted to the hospital, checkout time is 11:00 AM the day of  discharge.   Patients discharged the day of surgery will not be allowed to drive  home.  Name and phone number of your driver: Family/friend  Special Instructions: Shower using CHG 2 nights before surgery and the night before surgery.  If you shower the day of surgery use CHG.  Use special wash - you have one bottle of CHG for all showers.  You should use approximately 1/3 of the bottle for each shower.   Please read over the following fact sheets that you were given: Pain Booklet, Coughing and Deep Breathing and Surgical Site Infection Prevention

## 2012-11-18 NOTE — H&P (Signed)
HISTORY AND PHYSICAL  [name] is a 62 y.o. female patient with CC: painful teeth  No diagnosis found.  Past Medical History  Diagnosis Date  . Hypertension   . Diabetes mellitus   . Seizures   . Asthma   . Depression   . Mental retardation   . History of recurrent UTIs     No current facility-administered medications for this encounter.   Current Outpatient Prescriptions  Medication Sig Dispense Refill  . aspirin EC 81 MG tablet Take 81 mg by mouth daily.      . fish oil-omega-3 fatty acids 1000 MG capsule Take 1 g by mouth 2 (two) times daily.      . Fluticasone-Salmeterol (ADVAIR) 250-50 MCG/DOSE AEPB Inhale 1 puff into the lungs every 12 (twelve) hours.      . Insulin Glargine (LANTUS SOLOSTAR) 100 UNIT/ML SOPN Inject 35 Units into the skin at bedtime.      [name] lisinopril (PRINIVIL,ZESTRIL) 40 MG tablet Take 40 mg by mouth daily.      [name] loratadine-pseudoephedrine (LORATADINE-D 24HR) 10-240 MG per 24 hr tablet Take 1 tablet by mouth daily.      . metFORMIN (GLUCOPHAGE) 1000 MG tablet Take 1,000 mg by mouth 2 (two) times daily with a meal.      . metoprolol succinate (TOPROL-XL) 50 MG 24 hr tablet Take 100 mg by mouth at bedtime. Take with or immediately following a meal.      . montelukast (SINGULAIR) 10 MG tablet Take 10 mg by mouth at bedtime.      . Naftifine HCl (NAFTIN) 1 % GEL Apply 1 application topically at bedtime. Apply to feet at bedtime after removing support stockings      . phenytoin (DILANTIN) 100 MG ER capsule Take 100 mg by mouth 2 (two) times daily.       No Known Allergies Active Problems:   * No active hospital problems. *  Vitals: There were no vitals taken for this visit. Lab results:No results found for this or any previous visit (from the past 24 hour(s)). Radiology Results: No results found. General appearance: alert, cooperative, no distress and moderately obese Head: Normocephalic, without obvious abnormality, atraumatic Eyes:  negative Ears: normal TM's and external ear canals both ears Nose: Nares normal. Septum midline. Mucosa normal. No drainage or sinus tenderness. Throat: dental caries and periodontal disease teeth #'s 1, 2, 4, 5, 6, 7, 8, 9, 10, 11, 12, 13, 14, 18, 19, 20, 23, 24, 25, 26, 28, 29, 32, impacted tooth # 17, bilateral mandibular lingual tori Neck: no adenopathy, supple, symmetrical, trachea midline and thyroid not enlarged, symmetric, no tenderness/mass/nodules Resp: clear to auscultation bilaterally Cardio: regular rate and rhythm, S1, S2 normal, no murmur, click, rub or gallop  Assessment: 62 BF HTN, IDDM, Obesity, Seizures, Asthma, Depression, mental retardation with non-restorable and impacted teeth #'s 1, 2, 4, 5, 6, 7, 8, 9, 10, 11, 12, 13, 14, 18, 19, 20, 23, 24, 25, 26, 28, 29, 32, impacted tooth # 17, bilateral mandibular lingual tori.  Plan:Dental extractions teeth #'s 1, 2, 4, 5, 6, 7, 8, 9, 10, 11, 12, 13, 14, 17,  18, 19, 20, 23, 24, 25, 26, 28, 29, 32, alveoloplasty, removal  bilateral mandibular lingual tori. General anesthesia. Day surgery.    [name] 11/18/2012

## 2012-11-19 NOTE — Progress Notes (Signed)
Anesthesia Chart Review:  Please see my note from 09/30/12.  Surgery was initially scheduled for 10/05/12, but was cancelled because she had eaten.  Of note, she was evaluated by GI for history of elevated LFTs on 10/20/12 Dorene Ar, NP for Dr. Lionel December).  LFTs repeated which showed a normal AST/ALT but elevated Alk Phos of 241 (down from 286 on 08/12/12).  Her statin therapy was placed on hold, and she is scheduled for continued follow-up with GI on 12/02/12.  Labs from 11/18/12 noted.  H/H 9.8/30.8, but stable since 09/30/12.  PLT count is WNL.  If no acute change then anticipate that she can proceed as planned.  Velna Ochs [facility]/Anesthesiology Phone [phone] 11/19/2012 1:12 PM

## 2012-11-22 MED ORDER — CEFAZOLIN SODIUM-DEXTROSE 2-3 GM-% IV SOLR
2.0000 g | INTRAVENOUS | Status: AC
  Administered 2012-11-23: 2 g via INTRAVENOUS
  Filled 2012-11-22: qty 50

## 2012-11-23 ENCOUNTER — Ambulatory Visit (HOSPITAL_COMMUNITY): Payer: PRIVATE HEALTH INSURANCE | Admitting: Anesthesiology

## 2012-11-23 ENCOUNTER — Ambulatory Visit (HOSPITAL_COMMUNITY)
Admission: RE | Admit: 2012-11-23 | Discharge: 2012-11-23 | Disposition: A | Discharge status: Home / Self Care | Payer: PRIVATE HEALTH INSURANCE | Source: Ambulatory Visit | Attending: Oral Surgery | Admitting: Oral Surgery

## 2012-11-23 ENCOUNTER — Encounter (HOSPITAL_COMMUNITY): Payer: Self-pay | Admitting: Vascular Surgery

## 2012-11-23 ENCOUNTER — Encounter (HOSPITAL_COMMUNITY)
Admission: RE | Disposition: A | Discharge status: Home / Self Care | Payer: Self-pay | Source: Ambulatory Visit | Attending: Oral Surgery

## 2012-11-23 DIAGNOSIS — IMO0002 Reserved for concepts with insufficient information to code with codable children: Secondary | ICD-10-CM | POA: Insufficient documentation

## 2012-11-23 DIAGNOSIS — Z794 Long term (current) use of insulin: Secondary | ICD-10-CM | POA: Insufficient documentation

## 2012-11-23 DIAGNOSIS — L538 Other specified erythematous conditions: Secondary | ICD-10-CM

## 2012-11-23 DIAGNOSIS — J45901 Unspecified asthma with (acute) exacerbation: Secondary | ICD-10-CM

## 2012-11-23 DIAGNOSIS — I498 Other specified cardiac arrhythmias: Secondary | ICD-10-CM

## 2012-11-23 DIAGNOSIS — F79 Unspecified intellectual disabilities: Secondary | ICD-10-CM | POA: Insufficient documentation

## 2012-11-23 DIAGNOSIS — K006 Disturbances in tooth eruption: Secondary | ICD-10-CM | POA: Insufficient documentation

## 2012-11-23 DIAGNOSIS — F329 Major depressive disorder, single episode, unspecified: Secondary | ICD-10-CM | POA: Insufficient documentation

## 2012-11-23 DIAGNOSIS — K011 Impacted teeth: Secondary | ICD-10-CM

## 2012-11-23 DIAGNOSIS — D539 Nutritional anemia, unspecified: Secondary | ICD-10-CM

## 2012-11-23 DIAGNOSIS — K029 Dental caries, unspecified: Secondary | ICD-10-CM

## 2012-11-23 DIAGNOSIS — R945 Abnormal results of liver function studies: Secondary | ICD-10-CM

## 2012-11-23 DIAGNOSIS — D649 Anemia, unspecified: Secondary | ICD-10-CM

## 2012-11-23 DIAGNOSIS — J309 Allergic rhinitis, unspecified: Secondary | ICD-10-CM

## 2012-11-23 DIAGNOSIS — M109 Gout, unspecified: Secondary | ICD-10-CM

## 2012-11-23 DIAGNOSIS — K0510 Chronic gingivitis, plaque induced: Secondary | ICD-10-CM | POA: Insufficient documentation

## 2012-11-23 DIAGNOSIS — Z7982 Long term (current) use of aspirin: Secondary | ICD-10-CM | POA: Insufficient documentation

## 2012-11-23 DIAGNOSIS — M278 Other specified diseases of jaws: Secondary | ICD-10-CM | POA: Insufficient documentation

## 2012-11-23 DIAGNOSIS — G40909 Epilepsy, unspecified, not intractable, without status epilepticus: Secondary | ICD-10-CM | POA: Insufficient documentation

## 2012-11-23 DIAGNOSIS — R569 Unspecified convulsions: Secondary | ICD-10-CM

## 2012-11-23 DIAGNOSIS — F3289 Other specified depressive episodes: Secondary | ICD-10-CM | POA: Insufficient documentation

## 2012-11-23 DIAGNOSIS — R109 Unspecified abdominal pain: Secondary | ICD-10-CM

## 2012-11-23 DIAGNOSIS — E669 Obesity, unspecified: Secondary | ICD-10-CM | POA: Insufficient documentation

## 2012-11-23 DIAGNOSIS — Z79899 Other long term (current) drug therapy: Secondary | ICD-10-CM | POA: Insufficient documentation

## 2012-11-23 DIAGNOSIS — G479 Sleep disorder, unspecified: Secondary | ICD-10-CM

## 2012-11-23 DIAGNOSIS — K869 Disease of pancreas, unspecified: Secondary | ICD-10-CM

## 2012-11-23 DIAGNOSIS — K1370 Unspecified lesions of oral mucosa: Secondary | ICD-10-CM | POA: Insufficient documentation

## 2012-11-23 DIAGNOSIS — Z87898 Personal history of other specified conditions: Secondary | ICD-10-CM

## 2012-11-23 DIAGNOSIS — E785 Hyperlipidemia, unspecified: Secondary | ICD-10-CM

## 2012-11-23 DIAGNOSIS — J45909 Unspecified asthma, uncomplicated: Secondary | ICD-10-CM | POA: Insufficient documentation

## 2012-11-23 DIAGNOSIS — I10 Essential (primary) hypertension: Secondary | ICD-10-CM | POA: Insufficient documentation

## 2012-11-23 DIAGNOSIS — E119 Type 2 diabetes mellitus without complications: Secondary | ICD-10-CM | POA: Insufficient documentation

## 2012-11-23 DIAGNOSIS — M129 Arthropathy, unspecified: Secondary | ICD-10-CM

## 2012-11-23 DIAGNOSIS — K056 Periodontal disease, unspecified: Secondary | ICD-10-CM

## 2012-11-23 HISTORY — PX: MULTIPLE EXTRACTIONS WITH ALVEOLOPLASTY: SHX5342

## 2012-11-23 LAB — GLUCOSE, CAPILLARY
Glucose-Capillary: 101 mg/dL — ABNORMAL HIGH (ref 70–99)
Glucose-Capillary: 54 mg/dL — ABNORMAL LOW (ref 70–99)

## 2012-11-23 SURGERY — MULTIPLE EXTRACTION WITH ALVEOLOPLASTY
Anesthesia: General | Site: Mouth | Wound class: Clean Contaminated

## 2012-11-23 MED ORDER — OXYCODONE HCL 5 MG PO TABS: 5.0000 mg | ORAL_TABLET | Freq: Once | ORAL | Status: DC | PRN

## 2012-11-23 MED ORDER — MIDAZOLAM HCL 5 MG/5ML IJ SOLN
INTRAMUSCULAR | Status: DC | PRN
  Administered 2012-11-23 (×2): 1 mg via INTRAVENOUS

## 2012-11-23 MED ORDER — HYDROMORPHONE HCL PF 1 MG/ML IJ SOLN
INTRAMUSCULAR | Status: AC
  Filled 2012-11-23: qty 1

## 2012-11-23 MED ORDER — LIDOCAINE HCL (CARDIAC) 20 MG/ML IV SOLN
INTRAVENOUS | Status: DC | PRN
  Administered 2012-11-23: 100 mg via INTRAVENOUS

## 2012-11-23 MED ORDER — OXYCODONE-ACETAMINOPHEN 10-325 MG PO TABS: 1.0000 | ORAL_TABLET | ORAL | Status: DC | PRN

## 2012-11-23 MED ORDER — LIDOCAINE-EPINEPHRINE 2 %-1:100000 IJ SOLN
INTRAMUSCULAR | Status: AC
  Filled 2012-11-23: qty 1

## 2012-11-23 MED ORDER — OXYMETAZOLINE HCL 0.05 % NA SOLN
NASAL | Status: DC | PRN
  Administered 2012-11-23: 2 via NASAL

## 2012-11-23 MED ORDER — ARTIFICIAL TEARS OP OINT
TOPICAL_OINTMENT | OPHTHALMIC | Status: DC | PRN
  Administered 2012-11-23: 1 via OPHTHALMIC

## 2012-11-23 MED ORDER — HEMOSTATIC AGENTS (NO CHARGE) OPTIME
TOPICAL | Status: DC | PRN
  Administered 2012-11-23: 1 via TOPICAL

## 2012-11-23 MED ORDER — PHENYLEPHRINE HCL 10 MG/ML IJ SOLN
10.0000 mg | INTRAVENOUS | Status: DC | PRN
  Administered 2012-11-23: 50 ug/min via INTRAVENOUS

## 2012-11-23 MED ORDER — PROPOFOL 10 MG/ML IV BOLUS
INTRAVENOUS | Status: DC | PRN
  Administered 2012-11-23: 100 mg via INTRAVENOUS

## 2012-11-23 MED ORDER — MEPERIDINE HCL 25 MG/ML IJ SOLN: 6.2500 mg | INTRAMUSCULAR | Status: DC | PRN

## 2012-11-23 MED ORDER — GLYCOPYRROLATE 0.2 MG/ML IJ SOLN
INTRAMUSCULAR | Status: DC | PRN
  Administered 2012-11-23: 0.4 mg via INTRAVENOUS

## 2012-11-23 MED ORDER — ROCURONIUM BROMIDE 100 MG/10ML IV SOLN
INTRAVENOUS | Status: DC | PRN
  Administered 2012-11-23: 50 mg via INTRAVENOUS

## 2012-11-23 MED ORDER — OXYCODONE HCL 5 MG/5ML PO SOLN: 5.0000 mg | Freq: Once | ORAL | Status: DC | PRN

## 2012-11-23 MED ORDER — ONDANSETRON HCL 4 MG/2ML IJ SOLN: 4.0000 mg | Freq: Once | INTRAMUSCULAR | Status: DC | PRN

## 2012-11-23 MED ORDER — LACTATED RINGERS IV SOLN
INTRAVENOUS | Status: DC | PRN
  Administered 2012-11-23 (×2): via INTRAVENOUS

## 2012-11-23 MED ORDER — HYDROMORPHONE HCL PF 1 MG/ML IJ SOLN
0.2500 mg | INTRAMUSCULAR | Status: DC | PRN
  Administered 2012-11-23 (×2): 0.5 mg via INTRAVENOUS

## 2012-11-23 MED ORDER — NEOSTIGMINE METHYLSULFATE 1 MG/ML IJ SOLN
INTRAMUSCULAR | Status: DC | PRN
  Administered 2012-11-23: 3 mg via INTRAVENOUS

## 2012-11-23 MED ORDER — SODIUM CHLORIDE 0.9 % IR SOLN
Status: DC | PRN
  Administered 2012-11-23: 1000 mL

## 2012-11-23 MED ORDER — PHENYLEPHRINE HCL 10 MG/ML IJ SOLN
INTRAMUSCULAR | Status: DC | PRN
  Administered 2012-11-23: 80 ug via INTRAVENOUS

## 2012-11-23 MED ORDER — EPHEDRINE SULFATE 50 MG/ML IJ SOLN
INTRAMUSCULAR | Status: DC | PRN
  Administered 2012-11-23 (×2): 10 mg via INTRAVENOUS

## 2012-11-23 MED ORDER — FENTANYL CITRATE 0.05 MG/ML IJ SOLN
INTRAMUSCULAR | Status: DC | PRN
  Administered 2012-11-23: 150 ug via INTRAVENOUS
  Administered 2012-11-23: 50 ug via INTRAVENOUS

## 2012-11-23 MED ORDER — ONDANSETRON HCL 4 MG/2ML IJ SOLN
INTRAMUSCULAR | Status: DC | PRN
  Administered 2012-11-23: 4 mg via INTRAVENOUS

## 2012-11-23 MED ORDER — LACTATED RINGERS IV SOLN: INTRAVENOUS | Status: DC

## 2012-11-23 MED ORDER — DEXTROSE 50 % IV SOLN
INTRAVENOUS | Status: DC | PRN
  Administered 2012-11-23: 12.5 g via INTRAVENOUS

## 2012-11-23 MED ORDER — AMOXICILLIN 500 MG PO CAPS: 500.0000 mg | ORAL_CAPSULE | Freq: Three times a day (TID) | ORAL | Status: DC

## 2012-11-23 SURGICAL SUPPLY — 29 items
BLADE SURG 15 STRL LF DISP TIS (BLADE) IMPLANT
BLADE SURG 15 STRL SS (BLADE) ×2
BUR CROSS CUT FISSURE 1.6 (BURR) ×2 IMPLANT
BUR EGG ELITE 4.0 (BURR) ×1 IMPLANT
CANISTER SUCTION 2500CC (MISCELLANEOUS) ×2 IMPLANT
CLOTH BEACON ORANGE TIMEOUT ST (SAFETY) ×2 IMPLANT
COVER SURGICAL LIGHT HANDLE (MISCELLANEOUS) ×2 IMPLANT
CRADLE DONUT ADULT HEAD (MISCELLANEOUS) ×2 IMPLANT
DRSG TUBE GAUZE 1X5YD SZ2 (GAUZE/BANDAGES/DRESSINGS) ×1 IMPLANT
GAUZE PACKING FOLDED 2  STR (GAUZE/BANDAGES/DRESSINGS) ×1
GAUZE PACKING FOLDED 2 STR (GAUZE/BANDAGES/DRESSINGS) ×1 IMPLANT
GLOVE BIO SURGEON STRL SZ 6.5 (GLOVE) ×2 IMPLANT
GLOVE BIO SURGEON STRL SZ7.5 (GLOVE) ×2 IMPLANT
GLOVE BIOGEL PI IND STRL 7.0 (GLOVE) ×1 IMPLANT
GLOVE BIOGEL PI INDICATOR 7.0 (GLOVE) ×1
GOWN STRL NON-REIN LRG LVL3 (GOWN DISPOSABLE) ×2 IMPLANT
GOWN STRL REIN XL XLG (GOWN DISPOSABLE) ×2 IMPLANT
KIT BASIN OR (CUSTOM PROCEDURE TRAY) ×2 IMPLANT
KIT ROOM TURNOVER OR (KITS) ×2 IMPLANT
NEEDLE 22X1 1/2 (OR ONLY) (NEEDLE) ×2 IMPLANT
NS IRRIG 1000ML POUR BTL (IV SOLUTION) ×2 IMPLANT
PAD ARMBOARD 7.5X6 YLW CONV (MISCELLANEOUS) ×4 IMPLANT
SPONGE SURGIFOAM ABS GEL 12-7 (HEMOSTASIS) ×1 IMPLANT
SUT CHROMIC 3 0 PS 2 (SUTURE) ×3 IMPLANT
SYR CONTROL 10ML LL (SYRINGE) ×2 IMPLANT
TOWEL OR 17X26 10 PK STRL BLUE (TOWEL DISPOSABLE) ×2 IMPLANT
TRAY ENT MC OR (CUSTOM PROCEDURE TRAY) ×2 IMPLANT
TUBING IRRIGATION (MISCELLANEOUS) ×1 IMPLANT
YANKAUER SUCT BULB TIP NO VENT (SUCTIONS) ×2 IMPLANT

## 2012-11-23 NOTE — Anesthesia Postprocedure Evaluation (Signed)
Anesthesia Post Note  Patient: [name]  Procedure(s) Performed: Procedure(s) (LRB): MULTIPLE EXTRACION 1, 2, 4, 5, 6, 7, 8, 9, 10, 11, 12, 13, 14, 17, 18, 20, 23, 24, 25, 26, 28, 29, 32 WITH ALVEOLOPLASTY, REMOVE BILATERAL TORI (N/A)  Anesthesia type: general  Patient location: PACU  Post pain: Pain level controlled  Post assessment: Patient's Cardiovascular Status Stable  Last Vitals:  Filed Vitals:   11/23/12 1130  BP:   Pulse:   Temp: 36.1 C  Resp:     Post vital signs: Reviewed and stable  Level of consciousness: sedated  Complications: No apparent anesthesia complications

## 2012-11-23 NOTE — Op Note (Signed)
11/23/2012  8:39 AM  PATIENT:  [name]  62 y.o. female  PRE-OPERATIVE DIAGNOSIS:  NON RESTORABLE TEETH  1, 2, 4, 5, 6, 7, 8, 9, 10, 11, 12, 13, 14, 17, 18, 20, 23, 24, 25, 26, 28, 29, 32 , BILATERAL MANDIBULAR LINGUALTORI    POST-OPERATIVE DIAGNOSIS:  SAME + CHRONIC GRANULATION TISSUE RIGHT MAXILLA  PROCEDURE:  Procedure(s): MULTIPLE EXTRACTION 1, 2, 4, 5, 6, 7, 8, 9, 10, 11, 12, 13, 14, 17, 18, 20, 23, 24, 25, 26, 28, 29, 32 WITH ALVEOLOPLASTY, REMOVAL BILATERAL TORI, EXCISION CHRONIC GRANULATION TISSUE RIGHT MAXILLA  SURGEON:  Surgeon(s): Georgia Lopes, DDS  ANESTHESIA:   local and general  EBL:  minimal  DRAINS: none   SPECIMEN:  CHRONIC GRANULATION TISSUE RIGHT MAXILLA  COUNTS:  YES  PLAN OF CARE: Discharge to home after PACU  PATIENT DISPOSITION:  PACU - hemodynamically stable.   PROCEDURE DETAILS: Dictation # [id number]  Georgia Lopes, DMD 11/23/2012 8:39 AM

## 2012-11-23 NOTE — Progress Notes (Signed)
Discharge instructions done by Renee Rival RN. IV taken out by [employer]. Site WNL and cath tip intact

## 2012-11-23 NOTE — Preoperative (Signed)
Beta Blockers   Reason not to administer Beta Blockers:pATIENT took BB last PM

## 2012-11-23 NOTE — Anesthesia Procedure Notes (Signed)
Procedure Name: Intubation Date/Time: 11/23/2012 7:39 AM Performed by: Coralee Rud Pre-anesthesia Checklist: Patient identified, Emergency Drugs available, Suction available and Patient being monitored Patient Re-evaluated:Patient Re-evaluated prior to inductionOxygen Delivery Method: Circle system utilized Preoxygenation: Pre-oxygenation with 100% oxygen Intubation Type: IV induction Ventilation: Mask ventilation without difficulty Laryngoscope Size: 3 and Miller Grade View: Grade I Nasal Tubes: Right, Magill forceps- large, utilized, Nasal prep performed and Nasal Rae Tube size: 7.0 mm Number of attempts: 1 Placement Confirmation: ETT inserted through vocal cords under direct vision,  positive ETCO2 and breath sounds checked- equal and bilateral Tube secured with: Tape Dental Injury: Teeth and Oropharynx as per pre-operative assessment

## 2012-11-23 NOTE — Anesthesia Preprocedure Evaluation (Addendum)
Anesthesia Evaluation  Patient identified by MRN, date of birth, ID band Patient awake    Reviewed: Allergy & Precautions, H&P , NPO status , Patient's Chart, lab work & pertinent test results  Airway Mallampati: I TM Distance: >3 FB Neck ROM: Full    Dental  (+) Poor Dentition   Pulmonary asthma ,    Pulmonary exam normal       Cardiovascular hypertension, Pt. on medications Rhythm:Regular     Neuro/Psych Depression    GI/Hepatic   Endo/Other  diabetes, Poorly Controlled, Type 2, Insulin Dependent and Oral Hypoglycemic Agents  Renal/GU      Musculoskeletal   Abdominal   Peds  Hematology   Anesthesia Other Findings   Reproductive/Obstetrics                          Anesthesia Physical Anesthesia Plan  ASA: III  Anesthesia Plan: General   Post-op Pain Management:    Induction: Intravenous  Airway Management Planned: Nasal ETT  Additional Equipment:   Intra-op Plan:   Post-operative Plan: Extubation in OR  Informed Consent: I have reviewed the patients History and Physical, chart, labs and discussed the procedure including the risks, benefits and alternatives for the proposed anesthesia with the patient or authorized representative who has indicated his/her understanding and acceptance.     Plan Discussed with: CRNA and Surgeon  Anesthesia Plan Comments:         Anesthesia Quick Evaluation

## 2012-11-23 NOTE — H&P (Signed)
H&P documentation  -History and Physical Reviewed  -Patient has been re-examined  -No change in the plan of care  Kathee Tumlin M  

## 2012-11-23 NOTE — Transfer of Care (Signed)
Immediate Anesthesia Transfer of Care Note  Patient: [name]  Procedure(s) Performed: Procedure(s): MULTIPLE EXTRACION 1, 2, 4, 5, 6, 7, 8, 9, 10, 11, 12, 13, 14, 17, 18, 20, 23, 24, 25, 26, 28, 29, 32 WITH ALVEOLOPLASTY, REMOVE BILATERAL TORI (N/A)  Patient Location: PACU  Anesthesia Type:General  Level of Consciousness: awake, sedated, patient cooperative and confused  Airway & Oxygen Therapy: Patient Spontanous Breathing and Patient connected to face mask oxygen  Post-op Assessment: Report given to PACU RN, Post -op Vital signs reviewed and stable, Patient moving all extremities and Patient moving all extremities X 4  Post vital signs: Reviewed and stable  Complications: No apparent anesthesia complications

## 2012-11-24 ENCOUNTER — Encounter (HOSPITAL_COMMUNITY): Payer: Self-pay | Admitting: Oral Surgery

## 2012-11-24 NOTE — Progress Notes (Signed)
Patient is in a nursing facility,  We were unable to speak directly with the patient, only the administrator of the facility.

## 2012-11-24 NOTE — Op Note (Signed)
NAME:  Jennifer, [name] NO.:  [id number]  MEDICAL RECORD NO.:  [id number]  LOCATION:  [facility]                         FACILITY:  [facility]  PHYSICIAN:  Georgia Lopes, M.D.  DATE OF BIRTH:  May 20, 1950  DATE OF PROCEDURE:  11/23/2012 DATE OF DISCHARGE:  11/23/2012                              OPERATIVE REPORT   PREOPERATIVE DIAGNOSIS:  Nonrestorable teeth #1, 2, 4, 5, 6, 7, 8, 9, 10, 11, 12, 13, 14, 17, 18, 20, 23, 24, 25, 26, 28, 29, and 32 bilateral mandibular lingual tori.  POSTOPERATIVE DIAGNOSES:  Nonrestorable teeth #1, 2, 4, 5, 6, 7, 8, 9, 10, 11, 12, 13, 14, 17, 18, 20, 23, 24, 25, 26, 28, 29, and 32 bilateral mandibular lingual tori plus chronic granulation tissue, right maxilla.  PROCEDURE:  Extraction of teeth #1, 2, 4, 5, 6, 7, 8, 9, 10, 11, 12, 13, 14, 17 ,18, 20, 23, 24, 25, 26, 28, 29, and 32,  Alveoplasty, right and left maxilla mandible.  Removal of bilateral mandibular lingual tori and excision of chronic granulation tissue, right maxilla.  SURGEON:  Georgia Lopes, MD.  ANESTHESIA:  General.  INDICATIONS FOR PROCEDURE:  [name] is a 62 year old female with a history of diabetes, hypertension, seizures, asthma, depression, and mental retardation.  She was referred to me by her general dentist for removal of all remaining teeth with the exceptions #21, #22, and #27. She also had bilateral mandibular tori that required removal.  Because of the nature of the procedure and the patient's medical history, it was recommended that general anesthesia be performed with airway protection via intubation.  PROCEDURE:  The patient was taken to the operating room, placed on the table in supine position.  General anesthesia was administered intravenously and a nasal endotracheal tube was placed and secured.  The eyes were protected.  The patient was draped for the procedure, and then, time-out was performed.  The posterior pharynx was suctioned.  A throat pack  was placed.  Then, 2% lidocaine 1:100,000 epinephrine was infiltrated in the right and left inferior mandibular block and in buccal and palatal infiltration in the maxilla.  Total of 18 mL was utilized.  A bite block was placed in the right side of the mouth, and a sweetheart retractor was used to retract the tongue, and a 15 blade was used to make an incision overlying tooth #17 carrying anteriorly along tooth #18 up to tooth #20.  The periosteum was reflected from this area, and then, the 15 blade was used to make an incision around teeth # 22, #24, #25, and #26.  The incision was also made on the lingual aspect of teeth #20, #21, and # 22.  The periosteum was reflected in this area, and then, the Stryker handpiece with a fissure bur under irrigation was used to remove bone around tooth #17 and tooth #18.  Tooth #18 was then removed with the universal forceps, and then tooth #17 was removed after multiple sectioning.  Then, teeth # 20, #23, #24, #25, and #26 were removed with the Asch forceps.  Tooth #23 fractured during removal and additional bone was removed around this root to remove the root tip. Then, the  periosteum was reflected on the lingual aspect of the mandible, and the bony exostoses were identified on this side.  This was removed using the egg-shaped bur under irrigation.  Then, alveoplasty was performed with the egg-shaped bur as well, and a bone file was used to further smooth the area.  Then, the area was irrigated and closed with 3-0 chromic.  Then, a 15 blade used to make a full-thickness incision on teeth #14, #13, #12, #11, #10, #9, #8, and #7 in the palatal and buccal aspects.  The periosteum was reflected.  The teeth were elevated with a 301 elevator and removed from the mouth with a #150 forceps.  The sockets were then curetted and then the periosteum was reflected to expose the alveolar crest.  Then, the egg-shaped bur and bone file were used to perform the  alveoplasty.  Then, the areas were irrigated and closed with 3-0 chromic.  The bite block and sweetheart retractor were repositioned to the other side of the mouth.  A 15 blade was used to make an incision around teeth #28, #29, and #32, and lingually around teeth #22 to expose the mandibular torus.  Then, the periosteum was reflected.  There was copious granulation tissue in the right mandible.  This was removed with a 15 blade, and the periosteum was further reflected.  The tooth #32 was removed with forceps.  Then, the 301 elevator was used to elevate #28 and #29.  They were removed with the Asch forceps.  Then, the alveoplasty was performed.  The egg- shaped bur and the bone file were used to further smooth this area, and then, the mandibular torus was removed on the right side using the egg- shaped bur and a bone file.  Then, this area was irrigated and closed with 3-0 chromic.  There was continued bleeding from the right mandible in the area of #32, and Gelfoam was placed into the extraction socket. Then, a 15 blade was used to make an incision in the maxilla around teeth #1 and #2, #4, #5, and #6 on the buccal and palatal aspects.  The periosteum was reflected with a periosteal elevator.  There was gross granulation tissue in the right maxilla, and after removal of teeth #1 and #2 with the upper forceps, this tissue was removed and submitted for pathology in formalin.  Teeth #4, #5, and #6 were removed using a 301 elevator and the universal forceps.  The sockets were then curetted, and alveoplasty was performed, the egg-shaped bur in the right maxilla.  The area in the right maxilla was uncovered by mucosal tissues, so a releasing flap was made subperiosteally in the right maxilla to allow for buccal advancement to achieve close to primary closure.  Gelfoam sponge was then placed in the socket of tooth #14, which was bleeding at the end of the case, and the oral cavity was then  irrigated and suctioned.  The throat pack was removed.  All sponges were placed in the maxilla mandible area x3 to allow for hemostasis as incisions were gently bleeding.  The patient was then awakened, extubated, taken to the recovery room, breathing spontaneously in good condition.  ESTIMATED BLOOD LOSS:  Minimum.  COMPLICATIONS:  None.  SPECIMEN:  Chronic granulation tissue, right maxilla.     Georgia Lopes, M.D.     SMJ/MEDQ  D:  11/23/2012  T:  11/24/2012  Job:  [id number]

## 2012-12-02 ENCOUNTER — Encounter (INDEPENDENT_AMBULATORY_CARE_PROVIDER_SITE_OTHER): Payer: Self-pay | Admitting: Internal Medicine

## 2012-12-02 ENCOUNTER — Other Ambulatory Visit (INDEPENDENT_AMBULATORY_CARE_PROVIDER_SITE_OTHER): Payer: Self-pay | Admitting: Internal Medicine

## 2012-12-02 ENCOUNTER — Ambulatory Visit (INDEPENDENT_AMBULATORY_CARE_PROVIDER_SITE_OTHER): Payer: Medicare Other | Admitting: Internal Medicine

## 2012-12-02 VITALS — BP 130/70 | HR 72 | Temp 98.8°F | Ht 63.0 in | Wt 234.7 lb

## 2012-12-02 DIAGNOSIS — R748 Abnormal levels of other serum enzymes: Secondary | ICD-10-CM | POA: Insufficient documentation

## 2012-12-02 NOTE — Patient Instructions (Addendum)
OV in 3 months with an hepatic profile.

## 2012-12-02 NOTE — Progress Notes (Signed)
Subjective:     Patient ID: [name], female   DOB: 1950/06/11, 62 y.o.   MRN: [id number]  HPI  Here today for f/u of her elevated ALP. 11/18/2012 ALP was 241. She has a hx of elevated ALP.  Her appetite remains good. There is no abdominal pain. Her BMs are normal. She has a hx of mental retardation. Pravachol is on hold. She is takiing Dilantin for seizures. [place] in June see below.  She is not having any problems. Recently had all her teeth but 3 extracted last week.  Hepatitis A,B,C markers have been negative in the past. She has been a diabetic for many years and takes Lantus and Metformin. 09/04/2012 US abdomen:  Findings:  Gallbladder: No gallstones, gallbladder wall thickening, or  pericholecystic fluid. Gallbladder sludge is noted.  Common Bile Duct: Within normal limits in caliber.  Liver: The liver is diffusely heterogeneous without focal mass.  Diffusely heterogeneous liver compatible with diffuse hepatic  parenchymal disease. No focal mass.  Mild right hydronephrosis of unknown significance.  Chronic scarring and atrophyof the left kidney are not  significantly changed compared the prior CT.  CBC    Component Value Date/Time   WBC 8.4 11/18/2012 1242   RBC 3.70* 11/18/2012 1242   RBC 3.84* 11/07/2009 1649   HGB 9.8* 11/18/2012 1242   HCT 30.8* 11/18/2012 1242   PLT 303 11/18/2012 1242   MCV 83.2 11/18/2012 1242   MCH 26.5 11/18/2012 1242   MCHC 31.8 11/18/2012 1242   RDW 14.0 11/18/2012 1242   LYMPHSABS 2.1 11/07/2009 1649   MONOABS 0.6 11/07/2009 1649   EOSABS 0.7 11/07/2009 1649   BASOSABS 0.1 11/07/2009 1649      CMP     Component Value Date/Time   NA 136 11/18/2012 1242   K 5.3* 11/18/2012 1242   CL 99 11/18/2012 1242   CO2 25 11/18/2012 1242   GLUCOSE 179* 11/18/2012 1242   BUN 28* 11/18/2012 1242   CREATININE 1.00 11/18/2012 1242   CALCIUM 8.9 11/18/2012 1242   PROT 6.5 10/20/2012 1545   ALBUMIN 3.5 10/20/2012 1545   AST 10 10/20/2012 1545   ALT 9 10/20/2012 1545   ALKPHOS 241*  10/20/2012 1545   BILITOT 0.2* 10/20/2012 1545   GFRNONAA 60* 11/18/2012 1242   GFRAA 69* 11/18/2012 1242   09/28/2012 ALP 240, 08/27/18 225, 10/20/07 232, 04/27/07 232   Review of Systems Current Outpatient Prescriptions  Medication Sig Dispense Refill  . amoxicillin (AMOXIL) 500 MG capsule Take 1 capsule (500 mg total) by mouth 3 (three) times daily.  21 capsule  0  . aspirin EC 81 MG tablet Take 81 mg by mouth daily.      . fish oil-omega-3 fatty acids 1000 MG capsule Take 1 g by mouth 2 (two) times daily.      . Fluticasone-Salmeterol (ADVAIR) 250-50 MCG/DOSE AEPB Inhale 1 puff into the lungs every 12 (twelve) hours.      . Insulin Glargine (LANTUS SOLOSTAR) 100 UNIT/ML SOPN Inject 35 Units into the skin at bedtime.      [name] lisinopril (PRINIVIL,ZESTRIL) 40 MG tablet Take 40 mg by mouth daily.      [name] loratadine-pseudoephedrine (LORATADINE-D 24HR) 10-240 MG per 24 hr tablet Take 1 tablet by mouth daily.      . metFORMIN (GLUCOPHAGE) 1000 MG tablet Take 1,000 mg by mouth 2 (two) times daily with a meal.      . metoprolol succinate (TOPROL-XL) 50 MG 24 hr tablet Take 100 mg  by mouth at bedtime. Take with or immediately following a meal.      . montelukast (SINGULAIR) 10 MG tablet Take 10 mg by mouth at bedtime.      . Naftifine HCl (NAFTIN) 1 % GEL Apply 1 application topically at bedtime. Apply to feet at bedtime after removing support stockings      . oxyCODONE-acetaminophen (PERCOCET) 10-325 MG per tablet Take 1 tablet by mouth every 4 (four) hours as needed for pain.  40 tablet  0  . phenytoin (DILANTIN) 100 MG ER capsule Take 100 mg by mouth 2 (two) times daily.       No current facility-administered medications for this visit.   Past Medical History  Diagnosis Date  . Hypertension   . Diabetes mellitus   . Seizures   . Asthma   . Depression   . Mental retardation   . History of recurrent UTIs   . Shortness of breath     with exertion   Past Surgical History  Procedure Laterality Date   . Abdominal hysterectomy    . Multiple extractions with alveoloplasty N/A 11/23/2012    Procedure: MULTIPLE EXTRACION 1, 2, 4, 5, 6, 7, 8, 9, 10, 11, 12, 13, 14, 17, 18, 20, 23, 24, 25, 26, 28, 29, 32 WITH ALVEOLOPLASTY, REMOVE BILATERAL TORI;  Surgeon: Georgia Lopes, DDS;  Location: [facility] OR;  Service: Oral Surgery;  Laterality: N/A;   No Known Allergies     Objective:   Physical Exam  Filed Vitals:   12/02/12 1415  BP: 130/70  Pulse: 72  Temp: 98.8 F (37.1 C)  Height: 5\' 3"  (1.6 m)  Weight: 234 lb 11.2 oz (106.459 kg)   Alert and oriented. Skin warm and dry. Oral mucosa is moist.   . Sclera anicteric, conjunctivae is pink. Thyroid not enlarged. No cervical lymphadenopathy. Lungs clear. Heart regular rate and rhythm.  Abdomen is soft. Bowel sounds are positive. No hepatomegaly. No abdominal masses felt. No tenderness.  No edema to lower extremities. 1-2 + edema to lower extremities.     Assessment:    Elevated liver enzymes. ALP has been elevated for several year.    Plan:   GGT, AMA, Hepatic funciton, SMA,ferritin, ANA, OV in 3 months.  Continue to hold the Pravachol.

## 2012-12-03 LAB — COMPREHENSIVE METABOLIC PANEL
Albumin: 3.5 g/dL (ref 3.5–5.2)
Alkaline Phosphatase: 225 U/L — ABNORMAL HIGH (ref 39–117)
BUN: 24 mg/dL — ABNORMAL HIGH (ref 6–23)
CO2: 28 mEq/L (ref 19–32)
Calcium: 8.3 mg/dL — ABNORMAL LOW (ref 8.4–10.5)
Chloride: 98 mEq/L (ref 96–112)
Glucose, Bld: 61 mg/dL — ABNORMAL LOW (ref 70–99)
Potassium: 5.2 mEq/L (ref 3.5–5.3)
Sodium: 137 mEq/L (ref 135–145)
Total Protein: 6.7 g/dL (ref 6.0–8.3)

## 2012-12-03 LAB — HEPATIC FUNCTION PANEL
Alkaline Phosphatase: 247 U/L — ABNORMAL HIGH (ref 39–117)
Bilirubin, Direct: <0.1 mg/dL (ref 0.0–0.3)
Total Bilirubin: 0.2 mg/dL — ABNORMAL LOW (ref 0.3–1.2)
Total Protein: 6.7 g/dL (ref 6.0–8.3)

## 2012-12-03 LAB — MITOCHONDRIAL ANTIBODIES: Mitochondrial M2 Ab, IgG: 0.15 (ref <0.91)

## 2012-12-03 LAB — SEDIMENTATION RATE: Sed Rate: 42 mm/hr — ABNORMAL HIGH (ref 0–22)

## 2012-12-04 LAB — CERULOPLASMIN: Ceruloplasmin: 41 mg/dL (ref 20–60)

## 2012-12-14 ENCOUNTER — Telehealth (INDEPENDENT_AMBULATORY_CARE_PROVIDER_SITE_OTHER): Payer: Self-pay | Admitting: Internal Medicine

## 2012-12-15 ENCOUNTER — Telehealth (INDEPENDENT_AMBULATORY_CARE_PROVIDER_SITE_OTHER): Payer: Self-pay | Admitting: Internal Medicine

## 2012-12-15 NOTE — Telephone Encounter (Signed)
LFT and sedrate in 8 weeks. 02/08/13. She is going to start Iron 325mg  twice a day. Caregiver picked up 2 stool cards for blood. She may start the Pravachol back.  She will have an office appt 02/09/2013. She may need a colonoscopy

## 2012-12-15 NOTE — Telephone Encounter (Signed)
Opened in error

## 2012-12-22 ENCOUNTER — Telehealth (INDEPENDENT_AMBULATORY_CARE_PROVIDER_SITE_OTHER): Payer: Self-pay | Admitting: Internal Medicine

## 2012-12-22 NOTE — Telephone Encounter (Signed)
I spoke with caregiver at group home. Patient has 3 positive hemocult cards and she is anemic. Caregiver does not think patient has every undergone a colonoscopy in the past. I told her I thought [name] needed a colonoscopy. CBC    Component Value Date/Time   WBC 8.4 11/18/2012 1242   RBC 3.70* 11/18/2012 1242   RBC 3.84* 11/07/2009 1649   HGB 9.8* 11/18/2012 1242   HCT 30.8* 11/18/2012 1242   PLT 303 11/18/2012 1242   MCV 83.2 11/18/2012 1242   MCH 26.5 11/18/2012 1242   MCHC 31.8 11/18/2012 1242   RDW 14.0 11/18/2012 1242   LYMPHSABS 2.1 11/07/2009 1649   MONOABS 0.6 11/07/2009 1649   EOSABS 0.7 11/07/2009 1649   BASOSABS 0.1 11/07/2009 1649   Ann, please schedule a colonoscopy. [phone]. Diagnosis anemia: Guaiac positive stools. Patient is presently taking iron and ASA, Metformin and Lantus

## 2012-12-22 NOTE — Telephone Encounter (Signed)
Please add EGD

## 2012-12-22 NOTE — Telephone Encounter (Signed)
Needs colonoscopy. I have spoke with group home. She is anemic and with guaiac positive stools.  CBC    Component Value Date/Time   WBC 8.4 11/18/2012 1242   RBC 3.70* 11/18/2012 1242   RBC 3.84* 11/07/2009 1649   HGB 9.8* 11/18/2012 1242   HCT 30.8* 11/18/2012 1242   PLT 303 11/18/2012 1242   MCV 83.2 11/18/2012 1242   MCH 26.5 11/18/2012 1242   MCHC 31.8 11/18/2012 1242   RDW 14.0 11/18/2012 1242   LYMPHSABS 2.1 11/07/2009 1649   MONOABS 0.6 11/07/2009 1649   EOSABS 0.7 11/07/2009 1649   BASOSABS 0.1 11/07/2009 1649    Ferritin 15.

## 2012-12-23 ENCOUNTER — Other Ambulatory Visit (INDEPENDENT_AMBULATORY_CARE_PROVIDER_SITE_OTHER): Payer: Self-pay | Admitting: *Deleted

## 2012-12-23 ENCOUNTER — Encounter (INDEPENDENT_AMBULATORY_CARE_PROVIDER_SITE_OTHER): Payer: Self-pay | Admitting: *Deleted

## 2012-12-23 ENCOUNTER — Telehealth (INDEPENDENT_AMBULATORY_CARE_PROVIDER_SITE_OTHER): Payer: Self-pay | Admitting: *Deleted

## 2012-12-23 DIAGNOSIS — D509 Iron deficiency anemia, unspecified: Secondary | ICD-10-CM

## 2012-12-23 DIAGNOSIS — Z1211 Encounter for screening for malignant neoplasm of colon: Secondary | ICD-10-CM

## 2012-12-23 MED ORDER — PEG-KCL-NACL-NASULF-NA ASC-C 100 G PO SOLR: 1.0000 | Freq: Once | ORAL | Status: DC

## 2012-12-23 NOTE — Telephone Encounter (Signed)
This encounter was created in error - please disregard.

## 2012-12-23 NOTE — Telephone Encounter (Signed)
Adjustment noted, instructions mailed

## 2012-12-23 NOTE — Telephone Encounter (Signed)
EGD/TCS sch'd 01/14/13

## 2012-12-23 NOTE — Telephone Encounter (Signed)
TCS/EGD sch'd 01/14/13, patient is on Lantus & Metformin, how do you want her to adjust these

## 2012-12-23 NOTE — Telephone Encounter (Signed)
Patient needs movi prep 

## 2012-12-23 NOTE — Telephone Encounter (Signed)
Hold am Metformin, 1/2 Lantus at night

## 2012-12-29 ENCOUNTER — Encounter (INDEPENDENT_AMBULATORY_CARE_PROVIDER_SITE_OTHER): Payer: Self-pay | Admitting: *Deleted

## 2013-01-04 ENCOUNTER — Encounter (HOSPITAL_COMMUNITY): Payer: Self-pay

## 2013-01-13 ENCOUNTER — Other Ambulatory Visit (HOSPITAL_COMMUNITY): Payer: Self-pay | Admitting: Pulmonary Disease

## 2013-01-13 DIAGNOSIS — R1011 Right upper quadrant pain: Secondary | ICD-10-CM

## 2013-01-14 ENCOUNTER — Encounter (HOSPITAL_COMMUNITY)
Admission: RE | Disposition: A | Discharge status: Home / Self Care | Payer: Self-pay | Source: Ambulatory Visit | Attending: Internal Medicine

## 2013-01-14 ENCOUNTER — Ambulatory Visit (HOSPITAL_COMMUNITY)
Admission: RE | Admit: 2013-01-14 | Discharge: 2013-01-14 | Disposition: A | Discharge status: Home / Self Care | Payer: PRIVATE HEALTH INSURANCE | Source: Ambulatory Visit | Attending: Internal Medicine | Admitting: Internal Medicine

## 2013-01-14 ENCOUNTER — Encounter (HOSPITAL_COMMUNITY): Payer: Self-pay | Admitting: *Deleted

## 2013-01-14 DIAGNOSIS — Z01812 Encounter for preprocedural laboratory examination: Secondary | ICD-10-CM | POA: Insufficient documentation

## 2013-01-14 DIAGNOSIS — D509 Iron deficiency anemia, unspecified: Secondary | ICD-10-CM

## 2013-01-14 DIAGNOSIS — K2960 Other gastritis without bleeding: Secondary | ICD-10-CM

## 2013-01-14 DIAGNOSIS — E119 Type 2 diabetes mellitus without complications: Secondary | ICD-10-CM | POA: Insufficient documentation

## 2013-01-14 DIAGNOSIS — K921 Melena: Secondary | ICD-10-CM | POA: Insufficient documentation

## 2013-01-14 DIAGNOSIS — Z538 Procedure and treatment not carried out for other reasons: Secondary | ICD-10-CM | POA: Insufficient documentation

## 2013-01-14 DIAGNOSIS — K5730 Diverticulosis of large intestine without perforation or abscess without bleeding: Secondary | ICD-10-CM | POA: Insufficient documentation

## 2013-01-14 DIAGNOSIS — I10 Essential (primary) hypertension: Secondary | ICD-10-CM | POA: Insufficient documentation

## 2013-01-14 HISTORY — PX: COLONOSCOPY WITH ESOPHAGOGASTRODUODENOSCOPY (EGD): SHX5779

## 2013-01-14 LAB — GLUCOSE, CAPILLARY
Glucose-Capillary: 78 mg/dL (ref 70–99)
Glucose-Capillary: 156 mg/dL — ABNORMAL HIGH (ref 70–99)

## 2013-01-14 SURGERY — COLONOSCOPY WITH ESOPHAGOGASTRODUODENOSCOPY (EGD)
Anesthesia: Moderate Sedation

## 2013-01-14 MED ORDER — DEXTROSE-NACL 5-0.9 % IV SOLN
INTRAVENOUS | Status: DC
  Administered 2013-01-14: 15:00:00 via INTRAVENOUS

## 2013-01-14 MED ORDER — MEPERIDINE HCL 50 MG/ML IJ SOLN
INTRAMUSCULAR | Status: DC | PRN
  Administered 2013-01-14 (×2): 25 mg via INTRAVENOUS

## 2013-01-14 MED ORDER — GLUCAGON HCL (RDNA) 1 MG IJ SOLR
0.5000 mg | Freq: Once | INTRAMUSCULAR | Status: AC
  Administered 2013-01-14: .5 mg via INTRAVENOUS
  Filled 2013-01-14: qty 1

## 2013-01-14 MED ORDER — MIDAZOLAM HCL 5 MG/5ML IJ SOLN
INTRAMUSCULAR | Status: DC | PRN
  Administered 2013-01-14: 1 mg via INTRAVENOUS
  Administered 2013-01-14 (×2): 2 mg via INTRAVENOUS

## 2013-01-14 MED ORDER — STERILE WATER FOR IRRIGATION IR SOLN
Status: DC | PRN
  Administered 2013-01-14: 15:00:00

## 2013-01-14 MED ORDER — GLUCAGON HCL (RDNA) 1 MG IJ SOLR
INTRAMUSCULAR | Status: AC
  Filled 2013-01-14: qty 1

## 2013-01-14 MED ORDER — MIDAZOLAM HCL 5 MG/5ML IJ SOLN
INTRAMUSCULAR | Status: AC
  Filled 2013-01-14: qty 10

## 2013-01-14 MED ORDER — MEPERIDINE HCL 50 MG/ML IJ SOLN
INTRAMUSCULAR | Status: AC
  Filled 2013-01-14: qty 1

## 2013-01-14 MED ORDER — BUTAMBEN-TETRACAINE-BENZOCAINE 2-2-14 % EX AERO
INHALATION_SPRAY | CUTANEOUS | Status: DC | PRN
  Administered 2013-01-14: 2 via TOPICAL

## 2013-01-14 NOTE — H&P (Signed)
[name] is an 62 y.o. female.   Chief Complaint: Patient is here for EGD and colonoscopy. HPI: Patient is 62 year old African female with mild mental retardation who was recently evaluated for elevated transaminases and noted to have low hemoglobin. She was subsequently noted to have 3 out of 3 positive Hemoccults. Serum ferritin low at 15. She does give history of intermittent hematochezia which she's had for several months. She has good appetite. She denies weight loss. She also denies chronic heartburn dysphagia abdominal pain or melena.  Past Medical History  Diagnosis Date  . Hypertension   . Diabetes mellitus   . Seizures   . Asthma   . Depression   . Mental retardation   . History of recurrent UTIs   . Shortness of breath     with exertion    Past Surgical History  Procedure Laterality Date  . Abdominal hysterectomy    . Multiple extractions with alveoloplasty N/A 11/23/2012    Procedure: MULTIPLE EXTRACION 1, 2, 4, 5, 6, 7, 8, 9, 10, 11, 12, 13, 14, 17, 18, 20, 23, 24, 25, 26, 28, 29, 32 WITH ALVEOLOPLASTY, REMOVE BILATERAL TORI;  Surgeon: [name], DDS;  Location: [facility] OR;  Service: Oral Surgery;  Laterality: N/A;    History reviewed. No pertinent family history. Social History:  reports that she has quit smoking. Her smoking use included Cigarettes. She smoked 0.00 packs per day. She has never used smokeless tobacco. She reports that she does not drink alcohol or use illicit drugs.  Allergies: No Known Allergies  Medications Prior to Admission  Medication Sig Dispense Refill  . aspirin EC 81 MG tablet Take 81 mg by mouth daily.      . ferrous sulfate 325 (65 FE) MG tablet Take 325 mg by mouth 2 (two) times daily.      . fish oil-omega-3 fatty acids 1000 MG capsule Take 1 g by mouth 2 (two) times daily.      . Fluticasone-Salmeterol (ADVAIR) 250-50 MCG/DOSE AEPB Inhale 1 puff into the lungs every 12 (twelve) hours.      . Insulin Glargine (LANTUS SOLOSTAR) 100  UNIT/ML SOPN Inject 20 Units into the skin at bedtime.       [name] lisinopril (PRINIVIL,ZESTRIL) 40 MG tablet Take 40 mg by mouth daily.      [name] loratadine-pseudoephedrine (LORATADINE-D 24HR) 10-240 MG per 24 hr tablet Take 1 tablet by mouth daily.      . metFORMIN (GLUCOPHAGE) 1000 MG tablet Take 1,000 mg by mouth 2 (two) times daily with a meal.      . metoprolol succinate (TOPROL-XL) 50 MG 24 hr tablet Take 100 mg by mouth at bedtime. Take with or immediately following a meal.      . montelukast (SINGULAIR) 10 MG tablet Take 10 mg by mouth at bedtime.      . Naftifine HCl (NAFTIN) 1 % GEL Apply 1 application topically at bedtime. Apply to feet at bedtime after removing support stockings      . phenytoin (DILANTIN) 100 MG ER capsule Take 100 mg by mouth 2 (two) times daily.      . pravastatin (PRAVACHOL) 40 MG tablet Take 40 mg by mouth daily.      [name] triamcinolone cream (KENALOG) 0.1 % Apply 1 application topically daily. To legs      . NOVOFINE AUTOCOVER 30G X 8 MM MISC       . oxyCODONE-acetaminophen (PERCOCET) 10-325 MG per tablet Take 1 tablet by mouth every  4 (four) hours as needed for pain.  40 tablet  0  . peg 3350 powder (MOVIPREP) 100 G SOLR Take 1 kit (200 g total) by mouth once.  1 kit  0  . SALINE NASAL SPRAY NA Place 2 sprays into the nose 4 (four) times daily as needed (dry nose).        Results for orders placed during the hospital encounter of 01/14/13 (from the past 48 hour(s))  GLUCOSE, CAPILLARY     Status: None   Collection Time    01/14/13  2:24 PM      Result Value Range   Glucose-Capillary 78  70 - 99 mg/dL  GLUCOSE, CAPILLARY     Status: None   Collection Time    01/14/13  3:04 PM      Result Value Range   Glucose-Capillary 75  70 - 99 mg/dL   No results found.  ROS  Blood pressure 143/74, pulse 110, temperature 98.2 F (36.8 C), temperature source Oral, resp. rate 22, height 5\' 6"  (1.676 m), SpO2 99.00%. Physical Exam  Constitutional: She appears  well-developed and well-nourished.  HENT:  Mouth/Throat: Oropharynx is clear and moist.  Eyes: Conjunctivae are normal. No scleral icterus.  Neck: No thyromegaly present.  Cardiovascular: Normal rate, regular rhythm and normal heart sounds.   No murmur heard. Respiratory: Effort normal and breath sounds normal.  GI:  Protuberant abdomen with lower midline scar. No organomegaly masses or tenderness.  Musculoskeletal:  Trace edema around ankles with stasis dermatitis changes to skin involving right leg.  Lymphadenopathy:    She has no cervical adenopathy.  Neurological: She is alert.  Skin: Skin is warm and dry.     Assessment/Plan Hematochezia. Heme positive stools. Iron deficiency anemia. EGD and colonoscopy.  [name] U 01/14/2013, 3:29 PM

## 2013-01-14 NOTE — Op Note (Signed)
EGD PROCEDURE REPORT  PATIENT:  [name]  MR#:  [id number] Birthdate:  1950/08/15, 62 y.o., female Endoscopist:  Dr. Malissa Hippo, MD Referred By:  Dr. Oneal Deputy. Juanetta Gosling, MD Procedure Date: 01/14/2013  Procedure:   EGD & Colonoscopy(incomplete secondary to poor prep).  Indications:  Patient is 62 year old African female was heme positive stools history of hematochezia and iron deficiency anemia.             Informed Consent:  The risks, benefits, alternatives & imponderables which include, but are not limited to, bleeding, infection, perforation, drug reaction and potential missed lesion have been reviewed.  The potential for biopsy, lesion removal, esophageal dilation, etc. have also been discussed.  Questions have been answered.  All parties agreeable.  Please see history & physical in medical record for more information. Patient has mild mental retardation and deemed to be competent.  Medications:  Demerol 50 mg IV Versed 5 mg IV Cetacaine spray topically for oropharyngeal anesthesia  EGD  Description of procedure:  The endoscope was introduced through the mouth and advanced to the second portion of the duodenum without difficulty or limitations. The mucosal surfaces were surveyed very carefully during advancement of the scope and upon withdrawal.  Findings:  Esophagus:  Mucosa of the esophagus was normal. GE junction was unremarkable. GEJ:  38 cm Stomach:  Stomach was empty and distended very well with insufflation. Folds in the proximal stomach are normal. Examination of mucosa at body was normal. Focal erythema and granularity noted to antral mucosa. No erosions or ulcers present. Pyloric channel was patent. Angularis fundus and cardia was examined by retroflex the scope and were normal. Duodenum:  Normal bulbar and post bulbar mucosa.  Therapeutic/Diagnostic Maneuvers Performed:  None  COLONOSCOPY Description of procedure:  After a digital rectal exam was performed,  that colonoscope was advanced from the anus through the rectum and advanced to the sigmoid colon into area of splenic flexure. Scattered diverticula were noted sigmoid colon. Prep was poor therefore examination was abandoned. Endoscope was withdrawn.  Findings:   Poor prep; therefore procedure not completed. Scattered diverticula at sigmoid colon.  Therapeutic/Diagnostic Maneuvers Performed:  None  Complications:  None  Cecal Withdrawal Time:  N/A  Impression:  Focal antral gastritis otherwise normal EGD. Colonoscopy could not be completed secondary to poor prep.. Few diverticula sigmoid colon.   Recommendations:  H. pylori serology. Will reschedule patient for colonoscopy.  Al Gagen U  01/14/2013 4:01 PM  CC: Dr. Fredirick Maudlin, MD & Dr. Bonnetta Barry ref. provider found

## 2013-01-15 ENCOUNTER — Encounter (HOSPITAL_COMMUNITY): Payer: Self-pay | Admitting: Internal Medicine

## 2013-01-15 LAB — H. PYLORI ANTIBODY, IGG: H Pylori IgG: 1.02 {ISR} — ABNORMAL HIGH

## 2013-01-18 ENCOUNTER — Ambulatory Visit (HOSPITAL_COMMUNITY)
Admission: RE | Admit: 2013-01-18 | Discharge: 2013-01-18 | Disposition: A | Discharge status: Home / Self Care | Payer: PRIVATE HEALTH INSURANCE | Source: Ambulatory Visit | Attending: Pulmonary Disease | Admitting: Pulmonary Disease

## 2013-01-18 ENCOUNTER — Other Ambulatory Visit (HOSPITAL_COMMUNITY): Payer: Self-pay | Admitting: Pulmonary Disease

## 2013-01-18 DIAGNOSIS — R1011 Right upper quadrant pain: Secondary | ICD-10-CM

## 2013-01-19 ENCOUNTER — Encounter (INDEPENDENT_AMBULATORY_CARE_PROVIDER_SITE_OTHER): Payer: Self-pay

## 2013-01-20 ENCOUNTER — Encounter (INDEPENDENT_AMBULATORY_CARE_PROVIDER_SITE_OTHER): Payer: Self-pay | Admitting: Internal Medicine

## 2013-01-20 ENCOUNTER — Telehealth (INDEPENDENT_AMBULATORY_CARE_PROVIDER_SITE_OTHER): Payer: Self-pay | Admitting: *Deleted

## 2013-01-20 ENCOUNTER — Other Ambulatory Visit (INDEPENDENT_AMBULATORY_CARE_PROVIDER_SITE_OTHER): Payer: Self-pay | Admitting: *Deleted

## 2013-01-20 ENCOUNTER — Ambulatory Visit (INDEPENDENT_AMBULATORY_CARE_PROVIDER_SITE_OTHER): Payer: PRIVATE HEALTH INSURANCE | Admitting: Internal Medicine

## 2013-01-20 VITALS — BP 142/58 | HR 88 | Temp 98.2°F | Ht 65.0 in | Wt 229.0 lb

## 2013-01-20 DIAGNOSIS — R7989 Other specified abnormal findings of blood chemistry: Secondary | ICD-10-CM

## 2013-01-20 DIAGNOSIS — Z1211 Encounter for screening for malignant neoplasm of colon: Secondary | ICD-10-CM | POA: Insufficient documentation

## 2013-01-20 DIAGNOSIS — D649 Anemia, unspecified: Secondary | ICD-10-CM

## 2013-01-20 LAB — CBC WITH DIFFERENTIAL/PLATELET
Basophils Absolute: 0 10*3/uL (ref 0.0–0.1)
Basophils Relative: 0 % (ref 0–1)
HCT: 30 % — ABNORMAL LOW (ref 36.0–46.0)
Hemoglobin: 9.8 g/dL — ABNORMAL LOW (ref 12.0–15.0)
Lymphocytes Relative: 23 % (ref 12–46)
MCHC: 32.7 g/dL (ref 30.0–36.0)
Monocytes Relative: 8 % (ref 3–12)
Neutro Abs: 5.2 10*3/uL (ref 1.7–7.7)
Neutrophils Relative %: 64 % (ref 43–77)
RBC: 3.7 MIL/uL — ABNORMAL LOW (ref 3.87–5.11)
RDW: 15 % (ref 11.5–15.5)
WBC: 8.1 10*3/uL (ref 4.0–10.5)

## 2013-01-20 LAB — HEPATIC FUNCTION PANEL
Bilirubin, Direct: <0.1 mg/dL (ref 0.0–0.3)
Total Bilirubin: 0.2 mg/dL — ABNORMAL LOW (ref 0.3–1.2)

## 2013-01-20 NOTE — Patient Instructions (Addendum)
Will reschedule a colonoscopy with Dr. Karilyn Cota., Labs today. Will defer treating positive H. Pylori till after colonoscopy

## 2013-01-20 NOTE — Telephone Encounter (Signed)
Patient needs trilyte 

## 2013-01-20 NOTE — Progress Notes (Signed)
Subjective:     Patient ID: [name], female   DOB: 05/20/1950, 62 y.o.   MRN: [id number]  HPI Here today for follow up of elevated transaminases and hx of anemia. Stool cards were positive for blood. I discussed this case with Dr. Karilyn Cota in September. There is no sign of hepatocellular or extrahepatic cholestasis. Hepatitis A,B, and C markers have been negative in the past. Pravachol is on hold. Noted in past her ALP has been elevated.  She recently underwent and EGD/Colonoscopy by Dr. Karilyn Cota for anemia and heme positive stools. Please see below.  Appetite is good. She has lost about 5 pounds since her last office visit. No melena or bright red rectal bleeding.  Caregiver states patient does not exercise. Recently underwent multiple dental extractions.    01/14/2013 EGD/Colonoscopy:  Impression:  Focal antral gastritis otherwise normal EGD.  Colonoscopy could not be completed secondary to poor prep..  Few diverticula sigmoid colon.  Recommendations:  H. pylori serology + Will reschedule patient for colonoscopy.  CBC    Component Value Date/Time   WBC 8.4 11/18/2012 1242   RBC 3.70* 11/18/2012 1242   RBC 3.84* 11/07/2009 1649   HGB 9.8* 11/18/2012 1242   HCT 30.8* 11/18/2012 1242   PLT 303 11/18/2012 1242   MCV 83.2 11/18/2012 1242   MCH 26.5 11/18/2012 1242   MCHC 31.8 11/18/2012 1242   RDW 14.0 11/18/2012 1242   LYMPHSABS 2.1 11/07/2009 1649   MONOABS 0.6 11/07/2009 1649   EOSABS 0.7 11/07/2009 1649   BASOSABS 0.1 11/07/2009 1649      CMP     Component Value Date/Time   NA 137 12/02/2012 1507   K 5.2 12/02/2012 1507   CL 98 12/02/2012 1507   CO2 28 12/02/2012 1507   GLUCOSE 61* 12/02/2012 1507   BUN 24* 12/02/2012 1507   CREATININE 1.12* 12/02/2012 1507   CREATININE 1.00 11/18/2012 1242   CALCIUM 8.3* 12/02/2012 1507   PROT 6.7 12/02/2012 1507   ALBUMIN 3.5 12/02/2012 1507   AST 10 12/02/2012 1507   ALT 9 12/02/2012 1507   ALKPHOS 225* 12/02/2012 1507   BILITOT 0.1* 12/02/2012 1507   GFRNONAA 60* 11/18/2012 1242   GFRAA 69* 11/18/2012 1242    12/02/2012 Ceruloplasmin, GGT, Ferritin, ANA, SMA, mitochondrial antibody all were normal.   01/13/2013 US Abdomen limited to rt upper quadrant: IMPRESSION:  Question small amount of dependent sludge within gallbladder.  Otherwise negative exam.   09/04/2012 US abdomen: Diffusely heterogeneous liver compatible with diffuse hepatic  parenchymal disease. No focal mass.   Review of Systems see hpi     Objective:   Physical Exam  Filed Vitals:   01/20/13 1404  BP: 142/58  Pulse: 88  Temp: 98.2 F (36.8 C)  Height: 5\' 5"  (1.651 m)  Weight: 229 lb (103.874 kg)  Alert and oriented. Skin warm and dry. Oral mucosa is moist.   . Sclera anicteric, conjunctivae is pink. Thyroid not enlarged. No cervical lymphadenopathy. Lungs clear. Heart regular rate and rhythm.  Abdomen is soft. Bowel sounds are positive. No hepatomegaly. No abdominal masses felt. No tenderness. Obese  No edema to lower extremities.       Assessment:    Elevated ALP. All her Hepatitis  markers have been negative as well as auto immune markers  There is no sign of hepatocellular or extrahepatic cholestasis.  t.   Anemia: Recent colonoscopy was incomplete.      Plan:    sedrate, LFTs.CBC. OV in 6 months  with Dr Karilyn Cota Will also reschedule a colonoscopy with Dr. Karilyn Cota today Will defer treatment of H. Pylori till after colonoscopy

## 2013-01-21 LAB — SEDIMENTATION RATE: Sed Rate: 32 mm/hr — ABNORMAL HIGH (ref 0–22)

## 2013-01-22 MED ORDER — PEG 3350-KCL-NA BICARB-NACL 420 G PO SOLR: 4000.0000 mL | Freq: Once | ORAL | Status: DC

## 2013-02-03 ENCOUNTER — Encounter (HOSPITAL_COMMUNITY): Payer: Self-pay | Admitting: Pharmacy Technician

## 2013-02-04 ENCOUNTER — Encounter (HOSPITAL_COMMUNITY)
Admission: RE | Disposition: A | Discharge status: Home / Self Care | Payer: Self-pay | Source: Ambulatory Visit | Attending: Internal Medicine

## 2013-02-04 ENCOUNTER — Ambulatory Visit (HOSPITAL_COMMUNITY)
Admission: RE | Admit: 2013-02-04 | Discharge: 2013-02-04 | Disposition: A | Discharge status: Home / Self Care | Payer: PRIVATE HEALTH INSURANCE | Source: Ambulatory Visit | Attending: Internal Medicine | Admitting: Internal Medicine

## 2013-02-04 DIAGNOSIS — D128 Benign neoplasm of rectum: Secondary | ICD-10-CM | POA: Insufficient documentation

## 2013-02-04 DIAGNOSIS — Z538 Procedure and treatment not carried out for other reasons: Secondary | ICD-10-CM

## 2013-02-04 DIAGNOSIS — K5730 Diverticulosis of large intestine without perforation or abscess without bleeding: Secondary | ICD-10-CM

## 2013-02-04 DIAGNOSIS — Z01812 Encounter for preprocedural laboratory examination: Secondary | ICD-10-CM | POA: Insufficient documentation

## 2013-02-04 DIAGNOSIS — C20 Malignant neoplasm of rectum: Secondary | ICD-10-CM | POA: Insufficient documentation

## 2013-02-04 DIAGNOSIS — D509 Iron deficiency anemia, unspecified: Secondary | ICD-10-CM | POA: Insufficient documentation

## 2013-02-04 DIAGNOSIS — I10 Essential (primary) hypertension: Secondary | ICD-10-CM | POA: Insufficient documentation

## 2013-02-04 DIAGNOSIS — K921 Melena: Secondary | ICD-10-CM | POA: Insufficient documentation

## 2013-02-04 DIAGNOSIS — E119 Type 2 diabetes mellitus without complications: Secondary | ICD-10-CM | POA: Insufficient documentation

## 2013-02-04 DIAGNOSIS — D649 Anemia, unspecified: Secondary | ICD-10-CM

## 2013-02-04 HISTORY — PX: COLONOSCOPY: SHX5424

## 2013-02-04 LAB — GLUCOSE, CAPILLARY
Glucose-Capillary: 72 mg/dL (ref 70–99)
Glucose-Capillary: 108 mg/dL — ABNORMAL HIGH (ref 70–99)
Glucose-Capillary: 74 mg/dL (ref 70–99)

## 2013-02-04 SURGERY — COLONOSCOPY
Anesthesia: Moderate Sedation

## 2013-02-04 MED ORDER — SODIUM CHLORIDE 0.9 % IV SOLN: INTRAVENOUS | Status: DC

## 2013-02-04 MED ORDER — DEXTROSE IN LACTATED RINGERS 5 % IV SOLN
Freq: Once | INTRAVENOUS | Status: AC
  Administered 2013-02-04: 14:00:00 via INTRAVENOUS

## 2013-02-04 MED ORDER — MIDAZOLAM HCL 5 MG/5ML IJ SOLN
INTRAMUSCULAR | Status: DC | PRN
  Administered 2013-02-04: 2 mg via INTRAVENOUS
  Administered 2013-02-04: 1 mg via INTRAVENOUS
  Administered 2013-02-04: 2 mg via INTRAVENOUS

## 2013-02-04 MED ORDER — MEPERIDINE HCL 50 MG/ML IJ SOLN
INTRAMUSCULAR | Status: DC | PRN
  Administered 2013-02-04 (×2): 25 mg via INTRAVENOUS

## 2013-02-04 MED ORDER — DOCUSATE SODIUM 100 MG PO CAPS: 100.0000 mg | ORAL_CAPSULE | Freq: Every day | ORAL | Status: DC

## 2013-02-04 MED ORDER — SODIUM CHLORIDE 0.9 % IV SOLN
INTRAVENOUS | Status: DC
  Administered 2013-02-04: 14:00:00 via INTRAVENOUS

## 2013-02-04 MED ORDER — MIDAZOLAM HCL 5 MG/5ML IJ SOLN
INTRAMUSCULAR | Status: AC
  Filled 2013-02-04: qty 10

## 2013-02-04 MED ORDER — MEPERIDINE HCL 50 MG/ML IJ SOLN
INTRAMUSCULAR | Status: AC
  Filled 2013-02-04: qty 1

## 2013-02-04 NOTE — Op Note (Addendum)
COLONOSCOPY PROCEDURE REPORT  PATIENT:  [name]  MR#:  [id number] Birthdate:  01-25-1951, 62 y.o., female Endoscopist:  Dr. Malissa Hippo, MD Referred By:  Dr. Oneal Deputy. Juanetta Gosling, MD Procedure Date: 02/04/2013  Procedure:   Colonoscopy incomplete secondary to poor prep.  Indications:  Patient is 62 year old African female who has history of hematochezia and iron deficiency anemia and is here for diagnostic colonoscopy. She underwent EGD and colonoscopy 3 weeks ago. She was found to have H. pylori gastritis. Colonoscopy was aborted because of very poor prep. She's on returning for repeat attempt after two day prep.  Informed Consent:  The procedure and risks were reviewed with the patient and informed consent was obtained.  Medications:  Demerol 50 mg IV Versed 5 mg IV  Description of procedure:  Patient was placed in left lateral recumbent position and rectal examination performed. Mass palpated which felt to be in proximal rectum. Pentax pediatric colonoscope was placed in the rectum and advanced proximally without difficulty since prep was very poor. After vigorous washing scope could only be passed the hepatic flexure not any further. As the scope was withdrawn mucosa was examined as carefully as possible and findings noted. In the rectum scope was retroflexed.  Findings:   Very poor prep resulting in incomplete exam to hepatic flexure. Few scattered diverticula at sigmoid and descending colon. Large ulcerated mass at proximal rectum involving more than 50% of the circumference. Multiple biopsies taken. Normal mucosa of the distal rectum and anorectal junction.   Therapeutic/Diagnostic Maneuvers Performed:  See above  Complications:  None  Cecal Withdrawal Time:  NA  Impression:  Incomplete exam to hepatic flexure secondary to poor prep. Few scattered left-sided diverticula. Large friable rectal mass involving more than 50% of the circumference at proximal rectum.  This mass is palpable on digital exam.  Recommendations:  Standard instructions given. Colace 200 mg by mouth daily. I will contact patient,s POA with biopsy results and further recommendations.  [name]  02/04/2013 3:21 PM  CC: Dr. Fredirick Maudlin, MD & Dr. Bonnetta Barry ref. provider found    Addendum; Please note colonoscopy findings reviewed with Ms. Barnie Alderman who has POA [phone])

## 2013-02-04 NOTE — Progress Notes (Signed)
CBG 79 reported to Dr Karilyn Cota.  Ordered D 5%w with LR

## 2013-02-04 NOTE — Discharge Instructions (Signed)
Resume usual medications and diet. Physician will call with results of biopsy and further recommendations. Colonoscopy Care After These instructions give you information on caring for yourself after your procedure. Your doctor may also give you more specific instructions. Call your doctor if you have any problems or questions after your procedure. HOME CARE  Take it easy for the next 24 hours.  Rest.  Walk or use warm packs on your belly (abdomen) if you have belly cramping or gas.  Do not drive for 24 hours.  You may shower.  Do not sign important papers or use machinery for 24 hours.  Drink enough fluids to keep your pee (urine) clear or pale yellow.  Resume your normal diet. Avoid heavy or fried foods.  Avoid alcohol.  Continue taking your normal medicines.  Only take medicine as told by your doctor. Do not take aspirin. If you had growths (polyps) removed:  Do not take aspirin.  Do not drink alcohol for 7 days or as told by your doctor.  Eat a soft diet for 24 hours. GET HELP RIGHT AWAY IF:  You have a fever.  You pass clumps of tissue (blood clots) or fill the toilet with blood.  You have belly pain that gets worse and medicine does not help.  Your belly is puffy (swollen).  You feel sick to your stomach (nauseous) or throw up (vomit). MAKE SURE YOU:  Understand these instructions.  Will watch your condition.  Will get help right away if you are not doing well or get worse. Document Released: 04/06/2010 Document Revised: 05/27/2011 Document Reviewed: 04/06/2010 [facility] Patient Information 2014 [place], [place].

## 2013-02-04 NOTE — H&P (Signed)
[name] is an 62 y.o. female.   Chief Complaint: Patient is here for colonoscopy. HPI: Patient is 62 year old African female who was recently found to have heme positive stools and iron deficiency anemia. She also has history of intermittent hematochezia. She underwent EGD and colonoscopy 3 weeks ago. She was found to have H. pylori gastritis colonoscopy was incomplete secondary to poor prep. She is here for returning for repeat colonoscopy following today prep.  Past Medical History  Diagnosis Date  . Hypertension   . Diabetes mellitus     years  . Seizures   . Asthma   . Depression   . Mental retardation   . History of recurrent UTIs   . Shortness of breath     with exertion    Past Surgical History  Procedure Laterality Date  . Abdominal hysterectomy    . Multiple extractions with alveoloplasty N/A 11/23/2012    Procedure: MULTIPLE EXTRACION 1, 2, 4, 5, 6, 7, 8, 9, 10, 11, 12, 13, 14, 17, 18, 20, 23, 24, 25, 26, 28, 29, 32 WITH ALVEOLOPLASTY, REMOVE BILATERAL TORI;  Surgeon: Georgia Lopes, DDS;  Location: [facility] OR;  Service: Oral Surgery;  Laterality: N/A;  . Colonoscopy with esophagogastroduodenoscopy (egd) N/A 01/14/2013    Procedure: COLONOSCOPY WITH ESOPHAGOGASTRODUODENOSCOPY (EGD);  Surgeon: Malissa Hippo, MD;  Location: [facility];  Service: Endoscopy;  Laterality: N/A;  250-moved to 315 Ann to notify pt    No family history on file. Social History:  reports that she has quit smoking. Her smoking use included Cigarettes. She smoked 0.00 packs per day. She has never used smokeless tobacco. She reports that she does not drink alcohol or use illicit drugs.  Allergies: No Known Allergies  Medications Prior to Admission  Medication Sig Dispense Refill  . aspirin EC 81 MG tablet Take 81 mg by mouth daily.      . ferrous sulfate 325 (65 FE) MG tablet Take 325 mg by mouth 2 (two) times daily.      . fish oil-omega-3 fatty acids 1000 MG capsule Take 1 g by mouth 2 (two)  times daily.      . Fluticasone-Salmeterol (ADVAIR) 250-50 MCG/DOSE AEPB Inhale 1 puff into the lungs every 12 (twelve) hours.      [name] HYDROcodone-acetaminophen (NORCO/VICODIN) 5-325 MG per tablet Take 1 tablet by mouth 4 (four) times daily as needed for moderate pain.      . Insulin Glargine (LANTUS SOLOSTAR) 100 UNIT/ML SOPN Inject 20 Units into the skin at bedtime.       [name] lisinopril (PRINIVIL,ZESTRIL) 40 MG tablet Take 40 mg by mouth daily.      [name] loratadine-pseudoephedrine (LORATADINE-D 24HR) 10-240 MG per 24 hr tablet Take 1 tablet by mouth daily.      . metFORMIN (GLUCOPHAGE) 1000 MG tablet Take 1,000 mg by mouth 2 (two) times daily with a meal.      . metoprolol succinate (TOPROL-XL) 50 MG 24 hr tablet Take 100 mg by mouth at bedtime. Take with or immediately following a meal.      . montelukast (SINGULAIR) 10 MG tablet Take 10 mg by mouth at bedtime.      . naftifine (NAFTIN) 1 % cream Apply 1 application topically at bedtime.      [name] oxyCODONE-acetaminophen (PERCOCET) 10-325 MG per tablet Take 1 tablet by mouth every 4 (four) hours as needed for pain.      . pantoprazole (PROTONIX) 40 MG tablet Take 40 mg by mouth  daily.      . phenytoin (DILANTIN) 100 MG ER capsule Take 100 mg by mouth 2 (two) times daily.      . pravastatin (PRAVACHOL) 40 MG tablet Take 40 mg by mouth daily.      [name] SALINE NASAL SPRAY NA Place 2 sprays into the nose 4 (four) times daily as needed (dry nose).      [name] triamcinolone cream (KENALOG) 0.1 % Apply 1 application topically daily. To legs        No results found for this or any previous visit (from the past 48 hour(s)). No results found.  ROS  Blood pressure 159/73, pulse 90, temperature 98 F (36.7 C), temperature source Oral, resp. rate 13, height 5\' 5"  (1.651 m), weight 229 lb (103.874 kg), SpO2 99.00%. Physical Exam  Constitutional: She appears well-developed and well-nourished.  HENT:  Mouth/Throat: Oropharynx is clear and moist.  Eyes: Conjunctivae  are normal. No scleral icterus.  Neck: No thyromegaly present.  Cardiovascular: Normal rate, regular rhythm and normal heart sounds.   No murmur heard. Respiratory: Effort normal and breath sounds normal.  GI: Soft. Bowel sounds are normal.  Musculoskeletal: She exhibits no edema.  Lymphadenopathy:    She has no cervical adenopathy.  Neurological: She is alert.  Skin: Skin is warm and dry.     Assessment/Plan Hematochezia and heme positive stools. Iron deficiency anemia. Diagnostic colonoscopy.  Ceili Boshers U 02/04/2013, 2:41 PM

## 2013-02-08 ENCOUNTER — Ambulatory Visit (INDEPENDENT_AMBULATORY_CARE_PROVIDER_SITE_OTHER): Payer: PRIVATE HEALTH INSURANCE | Admitting: Internal Medicine

## 2013-02-08 ENCOUNTER — Encounter (INDEPENDENT_AMBULATORY_CARE_PROVIDER_SITE_OTHER): Payer: Self-pay | Admitting: Internal Medicine

## 2013-02-08 VITALS — BP 140/80 | HR 84 | Temp 99.1°F | Resp 18 | Ht 65.0 in | Wt 224.9 lb

## 2013-02-08 DIAGNOSIS — C20 Malignant neoplasm of rectum: Secondary | ICD-10-CM

## 2013-02-08 DIAGNOSIS — A048 Other specified bacterial intestinal infections: Secondary | ICD-10-CM

## 2013-02-08 DIAGNOSIS — B9681 Helicobacter pylori [H. pylori] as the cause of diseases classified elsewhere: Secondary | ICD-10-CM | POA: Insufficient documentation

## 2013-02-08 MED ORDER — BIS SUBCIT-METRONID-TETRACYC 140-125-125 MG PO CAPS: 3.0000 | ORAL_CAPSULE | Freq: Three times a day (TID) | ORAL | Status: DC

## 2013-02-08 NOTE — Progress Notes (Signed)
Presenting complaint;  [name] is here to discuss biopsy results.  Subjective:  [name] is 62 year old African American female with history of mild mental retardation is here for scheduled visit accompanied by her power of attorney Ms. Barnie Alderman and legal guardian Ms. Jone Baseman. She underwent EGD and colonoscopy on 01/14/2013 because of hematochezia and iron deficiency anemia. EGD revealed gastritis and her H. pylori serology came back positive. Colonoscopy could not be completed because of poor prep. [name] returned for colonoscopy on 02/04/2013 and exam once again was incomplete on account of poor prep even do better than the first time. She was found to have large rectal mass which was biopsied. Biopsy reveals invasive adenocarcinoma in the background of tubular adenoma. [name] has no complaints. According to Ms. Gwynn she has not passed any blood per rectum since her colonoscopy. She also denies abdominal pain nausea or vomiting and remained with good appetite     Current Medications: Current Outpatient Prescriptions  Medication Sig Dispense Refill  . aspirin EC 81 MG tablet Take 81 mg by mouth daily.      [name] docusate sodium (COLACE) 100 MG capsule Take 1 capsule (100 mg total) by mouth at bedtime.  10 capsule  0  . ferrous sulfate 325 (65 FE) MG tablet Take 325 mg by mouth 2 (two) times daily.      . fish oil-omega-3 fatty acids 1000 MG capsule Take 1 g by mouth 2 (two) times daily.      . Fluticasone-Salmeterol (ADVAIR) 250-50 MCG/DOSE AEPB Inhale 1 puff into the lungs every 12 (twelve) hours.      [name] HYDROcodone-acetaminophen (NORCO/VICODIN) 5-325 MG per tablet Take 1 tablet by mouth 4 (four) times daily as needed for moderate pain.      . Insulin Glargine (LANTUS SOLOSTAR) 100 UNIT/ML SOPN Inject 20 Units into the skin at bedtime.       [name] lisinopril (PRINIVIL,ZESTRIL) 40 MG tablet Take 40 mg by mouth daily.      [name] loratadine-pseudoephedrine (LORATADINE-D 24HR) 10-240 MG  per 24 hr tablet Take 1 tablet by mouth daily.      . metFORMIN (GLUCOPHAGE) 1000 MG tablet Take 1,000 mg by mouth 2 (two) times daily with a meal.      . metoprolol succinate (TOPROL-XL) 50 MG 24 hr tablet Take 100 mg by mouth at bedtime. Take with or immediately following a meal.      . montelukast (SINGULAIR) 10 MG tablet Take 10 mg by mouth at bedtime.      [name] oxyCODONE-acetaminophen (PERCOCET) 10-325 MG per tablet Take 1 tablet by mouth every 4 (four) hours as needed for pain.      . pantoprazole (PROTONIX) 40 MG tablet Take 40 mg by mouth daily.      . phenytoin (DILANTIN) 100 MG ER capsule Take 100 mg by mouth 2 (two) times daily.      . pravastatin (PRAVACHOL) 40 MG tablet Take 40 mg by mouth daily.      [name] SALINE NASAL SPRAY NA Place 2 sprays into the nose 4 (four) times daily as needed (dry nose).      [name] triamcinolone cream (KENALOG) 0.1 % Apply 1 application topically daily. To legs       No current facility-administered medications for this visit.     Objective: Blood pressure 140/80, pulse 84, temperature 99.1 F (37.3 C), temperature source Oral, resp. rate 18, height 5\' 5"  (1.651 m), weight 224 lb 14.4 oz (102.014 kg). [name] is alert and in  no acute distress.     Labs/studies Results: Biopsy of rectal lesion reveals invasive adenocarcinoma in the background of tubular adenoma. H. pylori serology positive.  Assessment:  #1. Rectal carcinoma. This finding was explained to the [name] in as simple terms as possible both by myself as well as Ms. Gwynn and Ms. Price. I'm afraid she still does not fully understand the nature of illness. She is agreeable to proceed with further workup and treatment. [name] will need abdominal pelvic CT and if it shows localized disease she will need a rectal ultrasound to accurately stage her disease and determine best treatment options. Please note Dr. Juanetta Gosling [name]'s primary care physician was also made aware of this diagnosis. #2. H.  pylori gastritis.  Plan:  [name] will go to lab for serum creatinine and CEA level. Abdominopelvic CT with contrast. Pylera 3 capsules by mouth 4 times a day for 10 days.

## 2013-02-08 NOTE — Patient Instructions (Signed)
Physician will call with the results of blood work and CT when completed

## 2013-02-09 ENCOUNTER — Encounter (HOSPITAL_COMMUNITY): Payer: Self-pay

## 2013-02-09 ENCOUNTER — Ambulatory Visit (INDEPENDENT_AMBULATORY_CARE_PROVIDER_SITE_OTHER): Payer: Medicare Other | Admitting: Internal Medicine

## 2013-02-09 ENCOUNTER — Ambulatory Visit (HOSPITAL_COMMUNITY)
Admission: RE | Admit: 2013-02-09 | Discharge: 2013-02-09 | Disposition: A | Discharge status: Home / Self Care | Payer: PRIVATE HEALTH INSURANCE | Source: Ambulatory Visit | Attending: Internal Medicine | Admitting: Internal Medicine

## 2013-02-09 DIAGNOSIS — C20 Malignant neoplasm of rectum: Secondary | ICD-10-CM | POA: Insufficient documentation

## 2013-02-09 DIAGNOSIS — E278 Other specified disorders of adrenal gland: Secondary | ICD-10-CM | POA: Insufficient documentation

## 2013-02-09 DIAGNOSIS — R1900 Intra-abdominal and pelvic swelling, mass and lump, unspecified site: Secondary | ICD-10-CM | POA: Insufficient documentation

## 2013-02-09 LAB — CREATININE WITH EST GFR: Creat: 1.11 mg/dL — ABNORMAL HIGH (ref 0.50–1.10)

## 2013-02-09 MED ORDER — IOHEXOL 300 MG/ML  SOLN
100.0000 mL | Freq: Once | INTRAMUSCULAR | Status: AC | PRN
  Administered 2013-02-09: 100 mL via INTRAVENOUS

## 2013-02-10 ENCOUNTER — Encounter (HOSPITAL_COMMUNITY): Payer: Self-pay | Admitting: Internal Medicine

## 2013-02-10 ENCOUNTER — Encounter (INDEPENDENT_AMBULATORY_CARE_PROVIDER_SITE_OTHER): Payer: Self-pay | Admitting: *Deleted

## 2013-02-16 ENCOUNTER — Telehealth: Payer: Self-pay

## 2013-02-16 ENCOUNTER — Other Ambulatory Visit: Payer: Self-pay

## 2013-02-16 DIAGNOSIS — C20 Malignant neoplasm of rectum: Secondary | ICD-10-CM

## 2013-02-16 NOTE — Telephone Encounter (Signed)
Pt needs to have instructions and meds reviewed for 02/18/13 lower eus

## 2013-02-16 NOTE — Telephone Encounter (Signed)
Pt is in an assisted living facility, [name] was given the information and instructions.  Instructions have been faxed to [phone]

## 2013-02-16 NOTE — Telephone Encounter (Signed)
Message copied by Donata Duff on Tue Feb 16, 2013 10:36 AM ------      Message from: Rachael Fee      Created: Tue Feb 16, 2013 10:18 AM      Regarding: RE: rectal [place]       Lets put her on for lower eus this Thursday to follow my last case, moderate sedation, radial only, 45 min, diagnosis; rectal adenocarcinoma            Thanks                  ----- Message -----         From: Donata Duff, CMA         Sent: 02/16/2013  10:01 AM           To: Rachael Fee, MD      Subject: FW: rectal [place]                                                        ----- Message -----         From: Simone Curia         Sent: 02/16/2013   9:46 AM           To: Donata Duff, CMA      Subject: rectal [place]                                                Hi [name],            Dr Karilyn Cota would like for [name] to have rectal [place] ASP with Dr Christella Hartigan, patient has rectal CA.            If you need additional information please let me know.            Thanks       ------

## 2013-02-18 ENCOUNTER — Encounter (HOSPITAL_COMMUNITY)
Admission: RE | Disposition: A | Discharge status: Home / Self Care | Payer: Self-pay | Source: Ambulatory Visit | Attending: Gastroenterology

## 2013-02-18 ENCOUNTER — Encounter (HOSPITAL_COMMUNITY): Payer: Self-pay

## 2013-02-18 ENCOUNTER — Ambulatory Visit (HOSPITAL_COMMUNITY)
Admission: RE | Admit: 2013-02-18 | Discharge: 2013-02-18 | Disposition: A | Discharge status: Home / Self Care | Payer: PRIVATE HEALTH INSURANCE | Source: Ambulatory Visit | Attending: Gastroenterology | Admitting: Gastroenterology

## 2013-02-18 DIAGNOSIS — C20 Malignant neoplasm of rectum: Secondary | ICD-10-CM

## 2013-02-18 DIAGNOSIS — Z794 Long term (current) use of insulin: Secondary | ICD-10-CM | POA: Insufficient documentation

## 2013-02-18 DIAGNOSIS — F70 Mild intellectual disabilities: Secondary | ICD-10-CM | POA: Insufficient documentation

## 2013-02-18 DIAGNOSIS — Z7982 Long term (current) use of aspirin: Secondary | ICD-10-CM | POA: Insufficient documentation

## 2013-02-18 DIAGNOSIS — C7800 Secondary malignant neoplasm of unspecified lung: Secondary | ICD-10-CM

## 2013-02-18 DIAGNOSIS — Z79899 Other long term (current) drug therapy: Secondary | ICD-10-CM | POA: Insufficient documentation

## 2013-02-18 HISTORY — PX: EUS: SHX5427

## 2013-02-18 LAB — GLUCOSE, CAPILLARY
Glucose-Capillary: 102 mg/dL — ABNORMAL HIGH (ref 70–99)
Glucose-Capillary: 64 mg/dL — ABNORMAL LOW (ref 70–99)

## 2013-02-18 SURGERY — ULTRASOUND, LOWER GI TRACT, ENDOSCOPIC
Anesthesia: Moderate Sedation

## 2013-02-18 MED ORDER — MIDAZOLAM HCL 10 MG/2ML IJ SOLN
INTRAMUSCULAR | Status: AC
  Filled 2013-02-18: qty 2

## 2013-02-18 MED ORDER — DEXTROSE 50 % IV SOLN
50.0000 mL | Freq: Once | INTRAVENOUS | Status: AC
  Administered 2013-02-18: 50 mL via INTRAVENOUS
  Filled 2013-02-18: qty 50

## 2013-02-18 MED ORDER — DIPHENHYDRAMINE HCL 50 MG/ML IJ SOLN
INTRAMUSCULAR | Status: AC
  Filled 2013-02-18: qty 1

## 2013-02-18 MED ORDER — SODIUM CHLORIDE 0.9 % IV SOLN
INTRAVENOUS | Status: DC
  Administered 2013-02-18: 13:00:00 via INTRAVENOUS

## 2013-02-18 MED ORDER — MIDAZOLAM HCL 10 MG/2ML IJ SOLN
INTRAMUSCULAR | Status: DC | PRN
  Administered 2013-02-18: 2 mg via INTRAVENOUS
  Administered 2013-02-18: 1 mg via INTRAVENOUS
  Administered 2013-02-18: 2 mg via INTRAVENOUS
  Administered 2013-02-18: 1 mg via INTRAVENOUS

## 2013-02-18 MED ORDER — FENTANYL CITRATE 0.05 MG/ML IJ SOLN
INTRAMUSCULAR | Status: DC | PRN
  Administered 2013-02-18 (×2): 25 ug via INTRAVENOUS

## 2013-02-18 MED ORDER — SPOT INK MARKER SYRINGE KIT
PACK | SUBMUCOSAL | Status: DC | PRN
  Administered 2013-02-18: 5 mL via SUBMUCOSAL

## 2013-02-18 MED ORDER — SPOT INK MARKER SYRINGE KIT
PACK | SUBMUCOSAL | Status: AC
  Filled 2013-02-18: qty 5

## 2013-02-18 MED ORDER — FENTANYL CITRATE 0.05 MG/ML IJ SOLN
INTRAMUSCULAR | Status: AC
  Filled 2013-02-18: qty 2

## 2013-02-18 NOTE — Op Note (Signed)
[facility] [address] [place], [address]   ENDOSCOPIC ULTRASOUND PROCEDURE REPORT  PATIENT: [name], [name]  MR#: [id number] BIRTHDATE: January 08, 1951  GENDER: Female ENDOSCOPIST: Rachael Fee, MD REFERRED BY:  Lionel December, M.D. PROCEDURE DATE:  02/18/2013 PROCEDURE:   Lower EUS , injection of submucosal dye ASA CLASS:      Class III INDICATIONS:   recently diagnosed rectal adenocarcinoma (Dr. Karilyn Cota); CEA 21, CT scan abd/pelvis shows no clear metastatic disease MEDICATIONS: Fentanyl 50 mcg IV and Versed 6 mg IV  DESCRIPTION OF PROCEDURE:   After the risks benefits and alternatives of the procedure were  explained, informed consent was obtained. The patient was then placed in the left, lateral, decubitus postion and IV sedation was administered. Throughout the procedure, the patients blood pressure, pulse and oxygen saturations were monitored continuously.  Under direct visualization, the Pentax EUS Radial [id number]  endoscope was introduced through the anus  and advanced to the sigmoid colon . Water was used as necessary to provide an acoustic interface.  Upon completion of the imaging, water was removed and the patient was sent to the recovery room in satisfactory condition.   Sigmoidoscopic findings: 1. Clearly malignant mass in rectum. The mass was 7cm long, nearly circumferential, the distal edge of the mass is 5cm from the anal verge.  Following EUS examination the distal edge of the mass was labeled with [name] submucosal injection in three locations.  EUS findings: 1. The mass above corresponded with a heterogeneous, hypoechoic lesion that clearly passes into and through the muscularis propria layer of the rectal wall. The mass was 9mm in depth (uT3) 2. There was a single 7mm, round, discrete perirectal lymphnode that was suspicious for malignant involvement. (uN1).  Impression: 7cm long, nearly circumferential uT3N1 rectal adenocarcinoma  with distal edge 5cm from anal verge.  The distal edge of the mass was labeled with [name]. She will need referrals to medical oncology, radiation oncology and general surgery.  I will communicate this was her referring physician.   _______________________________ eSigned:  Rachael Fee, MD 02/18/2013 2:46 PM

## 2013-02-18 NOTE — Interval H&P Note (Signed)
History and Physical Interval Note:  02/18/2013 1:54 PM  [name]  has presented today for surgery, with the diagnosis of Rectal cancer [154.1]  The various methods of treatment have been discussed with the patient and family. After consideration of risks, benefits and other options for treatment, the patient has consented to  Procedure(s): LOWER ENDOSCOPIC ULTRASOUND (EUS) (N/A) as a surgical intervention .  The patient's history has been reviewed, patient examined, no change in status, stable for surgery.  I have reviewed the patient's chart and labs.  Questions were answered to the patient's satisfaction.     Rachael Fee

## 2013-02-18 NOTE — H&P (View-Only) (Signed)
Presenting complaint;  Patient is here to discuss biopsy results.  Subjective:  Patient is 62-year-old African American female with history of mild mental retardation is here for scheduled visit accompanied by her power of attorney Ms. Melissa Price and legal guardian Ms. Christie Gwynn. She underwent EGD and colonoscopy on 01/14/2013 because of hematochezia and iron deficiency anemia. EGD revealed gastritis and her H. pylori serology came back positive. Colonoscopy could not be completed because of poor prep. Patient returned for colonoscopy on 02/04/2013 and exam once again was incomplete on account of poor prep even do better than the first time. She was found to have large rectal mass which was biopsied. Biopsy reveals invasive adenocarcinoma in the background of tubular adenoma. Patient has no complaints. According to Ms. Gwynn she has not passed any blood per rectum since her colonoscopy. She also denies abdominal pain nausea or vomiting and remained with good appetite     Current Medications: Current Outpatient Prescriptions  Medication Sig Dispense Refill  . aspirin EC 81 MG tablet Take 81 mg by mouth daily.      . docusate sodium (COLACE) 100 MG capsule Take 1 capsule (100 mg total) by mouth at bedtime.  10 capsule  0  . ferrous sulfate 325 (65 FE) MG tablet Take 325 mg by mouth 2 (two) times daily.      . fish oil-omega-3 fatty acids 1000 MG capsule Take 1 g by mouth 2 (two) times daily.      . Fluticasone-Salmeterol (ADVAIR) 250-50 MCG/DOSE AEPB Inhale 1 puff into the lungs every 12 (twelve) hours.      . HYDROcodone-acetaminophen (NORCO/VICODIN) 5-325 MG per tablet Take 1 tablet by mouth 4 (four) times daily as needed for moderate pain.      . Insulin Glargine (LANTUS SOLOSTAR) 100 UNIT/ML SOPN Inject 20 Units into the skin at bedtime.       . lisinopril (PRINIVIL,ZESTRIL) 40 MG tablet Take 40 mg by mouth daily.      . loratadine-pseudoephedrine (LORATADINE-D 24HR) 10-240 MG  per 24 hr tablet Take 1 tablet by mouth daily.      . metFORMIN (GLUCOPHAGE) 1000 MG tablet Take 1,000 mg by mouth 2 (two) times daily with a meal.      . metoprolol succinate (TOPROL-XL) 50 MG 24 hr tablet Take 100 mg by mouth at bedtime. Take with or immediately following a meal.      . montelukast (SINGULAIR) 10 MG tablet Take 10 mg by mouth at bedtime.      . oxyCODONE-acetaminophen (PERCOCET) 10-325 MG per tablet Take 1 tablet by mouth every 4 (four) hours as needed for pain.      . pantoprazole (PROTONIX) 40 MG tablet Take 40 mg by mouth daily.      . phenytoin (DILANTIN) 100 MG ER capsule Take 100 mg by mouth 2 (two) times daily.      . pravastatin (PRAVACHOL) 40 MG tablet Take 40 mg by mouth daily.      . SALINE NASAL SPRAY NA Place 2 sprays into the nose 4 (four) times daily as needed (dry nose).      . triamcinolone cream (KENALOG) 0.1 % Apply 1 application topically daily. To legs       No current facility-administered medications for this visit.     Objective: Blood pressure 140/80, pulse 84, temperature 99.1 F (37.3 C), temperature source Oral, resp. rate 18, height 5' 5" (1.651 m), weight 224 lb 14.4 oz (102.014 kg). Patient is alert and in   no acute distress.     Labs/studies Results: Biopsy of rectal lesion reveals invasive adenocarcinoma in the background of tubular adenoma. H. pylori serology positive.  Assessment:  #1. Rectal carcinoma. This finding was explained to the patient in as simple terms as possible both by myself as well as Ms. Gwynn and Ms. Price. I'm afraid she still does not fully understand the nature of illness. She is agreeable to proceed with further workup and treatment. Patient will need abdominal pelvic CT and if it shows localized disease she will need a rectal ultrasound to accurately stage her disease and determine best treatment options. Please note Dr. Hawkins patient's primary care physician was also made aware of this diagnosis. #2. H.  pylori gastritis.  Plan:  Patient will go to lab for serum creatinine and CEA level. Abdominopelvic CT with contrast. Pylera 3 capsules by mouth 4 times a day for 10 days.      

## 2013-02-19 ENCOUNTER — Encounter (HOSPITAL_COMMUNITY): Payer: Self-pay | Admitting: Gastroenterology

## 2013-02-22 ENCOUNTER — Other Ambulatory Visit (INDEPENDENT_AMBULATORY_CARE_PROVIDER_SITE_OTHER): Payer: Self-pay | Admitting: Internal Medicine

## 2013-02-22 DIAGNOSIS — C20 Malignant neoplasm of rectum: Secondary | ICD-10-CM

## 2013-02-23 ENCOUNTER — Encounter (HOSPITAL_COMMUNITY): Payer: Self-pay

## 2013-02-23 ENCOUNTER — Encounter (HOSPITAL_COMMUNITY): Payer: PRIVATE HEALTH INSURANCE | Attending: Hematology and Oncology

## 2013-02-23 ENCOUNTER — Other Ambulatory Visit (HOSPITAL_COMMUNITY): Payer: Self-pay | Admitting: Hematology and Oncology

## 2013-02-23 VITALS — BP 140/83 | HR 99 | Temp 98.1°F | Resp 18 | Ht 62.75 in | Wt 226.3 lb

## 2013-02-23 DIAGNOSIS — J45909 Unspecified asthma, uncomplicated: Secondary | ICD-10-CM

## 2013-02-23 DIAGNOSIS — D649 Anemia, unspecified: Secondary | ICD-10-CM

## 2013-02-23 DIAGNOSIS — F79 Unspecified intellectual disabilities: Secondary | ICD-10-CM

## 2013-02-23 DIAGNOSIS — G40909 Epilepsy, unspecified, not intractable, without status epilepticus: Secondary | ICD-10-CM

## 2013-02-23 DIAGNOSIS — I10 Essential (primary) hypertension: Secondary | ICD-10-CM

## 2013-02-23 DIAGNOSIS — B9681 Helicobacter pylori [H. pylori] as the cause of diseases classified elsewhere: Secondary | ICD-10-CM

## 2013-02-23 DIAGNOSIS — J309 Allergic rhinitis, unspecified: Secondary | ICD-10-CM

## 2013-02-23 DIAGNOSIS — C20 Malignant neoplasm of rectum: Secondary | ICD-10-CM

## 2013-02-23 DIAGNOSIS — D509 Iron deficiency anemia, unspecified: Secondary | ICD-10-CM

## 2013-02-23 DIAGNOSIS — IMO0001 Reserved for inherently not codable concepts without codable children: Secondary | ICD-10-CM

## 2013-02-23 LAB — CBC WITH DIFFERENTIAL/PLATELET
Basophils Absolute: 0 10*3/uL (ref 0.0–0.1)
Basophils Relative: 0 % (ref 0–1)
Eosinophils Absolute: 0.4 10*3/uL (ref 0.0–0.7)
HCT: 29.6 % — ABNORMAL LOW (ref 36.0–46.0)
MCH: 26.8 pg (ref 26.0–34.0)
MCHC: 31.4 g/dL (ref 30.0–36.0)
MCV: 85.3 fL (ref 78.0–100.0)
Monocytes Absolute: 0.6 10*3/uL (ref 0.1–1.0)
Neutro Abs: 4.8 10*3/uL (ref 1.7–7.7)
Neutrophils Relative %: 65 % (ref 43–77)
RBC: 3.47 MIL/uL — ABNORMAL LOW (ref 3.87–5.11)
RDW: 13.9 % (ref 11.5–15.5)

## 2013-02-23 LAB — COMPREHENSIVE METABOLIC PANEL
ALT: 10 U/L (ref 0–35)
AST: 16 U/L (ref 0–37)
Albumin: 3.1 g/dL — ABNORMAL LOW (ref 3.5–5.2)
Calcium: 8.9 mg/dL (ref 8.4–10.5)
Chloride: 97 mEq/L (ref 96–112)
Creatinine, Ser: 1.05 mg/dL (ref 0.50–1.10)
Sodium: 133 mEq/L — ABNORMAL LOW (ref 135–145)
Total Bilirubin: <0.2 mg/dL — ABNORMAL LOW (ref 0.3–1.2)
Total Protein: 6.7 g/dL (ref 6.0–8.3)

## 2013-02-23 MED ORDER — CAPECITABINE 500 MG PO TABS: ORAL_TABLET | ORAL | Status: DC

## 2013-02-23 NOTE — Progress Notes (Signed)
[name] presented for labwork. Labs per MD order drawn via Peripheral Line 23 gauge needle inserted in left AC  Good blood return present. Procedure without incident.  Needle removed intact. Patient tolerated procedure well.   

## 2013-02-23 NOTE — Progress Notes (Signed)
[facility] A. Zigmund Daniel, M.D.  NEW PATIENT EVALUATION   Name: [name] Date: 02/23/2013 MRN: [id number] DOB: 18-Nov-1950  PCP: Fredirick Maudlin, MD   REFERRING PHYSICIAN: Malissa Hippo, MD  REASON FOR REFERRAL: Newly diagnosed rectal cancer.     HISTORY OF PRESENT ILLNESS:[name] is a 62 y.o. female who is referred for newly diagnosed rectal cancer. The patient is mentally retarded but is able to answer simple questions with yes or no regarding symptoms. She does not know whether or not she has seen rectal bleeding and denies any pelvic pain. She denies any cough, wheezing, fever, night sweats, worsening lower extremity swelling or redness, incontinence, hematuria, vaginal bleeding, PND, orthopnea, palpitations, skin rash, joint pain, headache, but has had a history of seizures which are well controlled with medication. She's been evaluated by gastroenterology including an EUS with findings of node positive adenocarcinoma of the rectum and is here today for discussion and recommendations regarding intervention.   PAST MEDICAL HISTORY:  has a past medical history of Hypertension; Diabetes mellitus; Seizures; Asthma; Depression; Mental retardation; History of recurrent UTIs; and Shortness of breath.     PAST SURGICAL HISTORY: Past Surgical History  Procedure Laterality Date  . Abdominal hysterectomy    . Multiple extractions with alveoloplasty N/A 11/23/2012    Procedure: MULTIPLE EXTRACION 1, 2, 4, 5, 6, 7, 8, 9, 10, 11, 12, 13, 14, 17, 18, 20, 23, 24, 25, 26, 28, 29, 32 WITH ALVEOLOPLASTY, REMOVE BILATERAL TORI;  Surgeon: Georgia Lopes, DDS;  Location: [facility] OR;  Service: Oral Surgery;  Laterality: N/A;  . Colonoscopy with esophagogastroduodenoscopy (egd) N/A 01/14/2013    Procedure: COLONOSCOPY WITH ESOPHAGOGASTRODUODENOSCOPY (EGD);  Surgeon: Malissa Hippo, MD;  Location: [facility];  Service: Endoscopy;  Laterality:  N/A;  250-moved to 315 Ann to notify pt  . Colonoscopy N/A 02/04/2013    Procedure: COLONOSCOPY;  Surgeon: Malissa Hippo, MD;  Location: [facility];  Service: Endoscopy;  Laterality: N/A;  225  . Eus N/A 02/18/2013    Procedure: LOWER ENDOSCOPIC ULTRASOUND (EUS);  Surgeon: Rachael Fee, MD;  Location: [facility];  Service: Endoscopy;  Laterality: N/A;     CURRENT MEDICATIONS: has a current medication list which includes the following prescription(s): aspirin ec, bismuth-metronidazole-tetracycline, ferrous sulfate, fish oil-omega-3 fatty acids, fluticasone-salmeterol, insulin glargine, lisinopril, loratadine-pseudoephedrine, metformin, metoprolol succinate, montelukast, naftifine hcl, phenytoin, pravastatin, triamcinolone cream, capecitabine, docusate sodium, hydrocodone-acetaminophen, oxycodone-acetaminophen, pantoprazole, and saline.   ALLERGIES: Review of patient's allergies indicates no known allergies.   SOCIAL HISTORY:  reports that she has quit smoking. Her smoking use included Cigarettes. She smoked 0.00 packs per day. She has never used smokeless tobacco. She reports that she does not drink alcohol or use illicit drugs.   FAMILY HISTORY: family history is not on file.    REVIEW OF SYSTEMS:  Other than that discussed above is noncontributory.    PHYSICAL EXAM:  height is 5' 2.75" (1.594 m) and weight is 226 lb 4.8 oz (102.649 kg). Her temperature is 98.1 F (36.7 C). Her blood pressure is 140/83 and her pulse is 99. Her respiration is 18.    GENERAL:alert, no distress and comfortable. Morbidly obese and pleasant. SKIN: skin color, texture, turgor are normal, no rashes or significant lesions EYES: normal, Conjunctiva are pink and non-injected, sclera clear OROPHARYNX:no exudate, no erythema and lips, buccal mucosa, and tongue normal  NECK: supple, thyroid normal size, non-tender, without  nodularity CHEST: Normal AP diameter with no breast masses. LYMPH:  no palpable  lymphadenopathy in the cervical, axillary or inguinal LUNGS: clear to auscultation and percussion with normal breathing effort HEART: regular rate & rhythm and no murmurs ABDOMEN:abdomen soft, non-tender and normal bowel sounds MUSCULOSKELETALl:no cyanosis of digits, no clubbing or edema  NEURO: Laconic speech pattern., no focal motor/sensory deficits. Minimal tardive dyskinesia.    LABORATORY DATA:  Admission on 02/18/2013, Discharged on 02/18/2013  Component Date Value Range Status  . Glucose-Capillary 02/18/2013 64* 70 - 99 mg/dL Final  . Glucose-Capillary 02/18/2013 102* 70 - 99 mg/dL Final  Office Visit on 02/08/2013  Component Date Value Range Status  . Creat 02/08/2013 1.11* 0.50 - 1.10 mg/dL Final  . GFR, Est African American 02/08/2013 62   Final  . GFR, Est Non African American 02/08/2013 54*  Final   Comment:                            The estimated GFR is a calculation valid for adults (>=2 years old)                          that uses the CKD-EPI algorithm to adjust for age and sex. It is                            not to be used for children, pregnant women, hospitalized patients,                             patients on dialysis, or with rapidly changing kidney function.                          According to the NKDEP, eGFR >89 is normal, 60-89 shows mild                          impairment, 30-59 shows moderate impairment, 15-29 shows severe                          impairment and <15 is ESRD.                             . CEA 02/08/2013 21.0* 0.0 - 5.0 ng/mL Final  Admission on 02/04/2013, Discharged on 02/04/2013  Component Date Value Range Status  . Glucose-Capillary 02/04/2013 72  70 - 99 mg/dL Final  . Glucose-Capillary 02/04/2013 74  70 - 99 mg/dL Final  . Glucose-Capillary 02/04/2013 108* 70 - 99 mg/dL Final  . Glucose-Capillary 02/04/2013 111* 70 - 99 mg/dL Final    Urinalysis No results found for this basename: colorurine,  appearanceur,  labspec,   phurine,  glucoseu,  hgbur,  bilirubinur,  ketonesur,  proteinur,  urobilinogen,  nitrite,  leukocytesur      @RADIOGRAPHY : Ct Abdomen Pelvis W Contrast  02/09/2013   CLINICAL DATA:  History of rectal carcinoma  EXAM: CT ABDOMEN AND PELVIS WITH CONTRAST  TECHNIQUE: Multidetector CT imaging of the abdomen and pelvis was performed using the standard protocol following bolus administration of intravenous contrast.  CONTRAST:  OMNIPAQUE IOHEXOL 300 MG/ML  SOLN  COMPARISON:  Multiple exams dating back to 11/16/2005.  FINDINGS: Lung  bases are free of acute infiltrate or sizable effusion.  The liver and gallbladder as well as the spleen are within normal limits. The adrenal glands demonstrate a small 1 cm hypodensity within the right adrenal gland likely representing a small adenoma. It is stable from multiple previous exams. Pancreas is well visualized and interdigitated with fat. The left kidney demonstrates significant scarring but normal excretion of contrast material. The right kidney shows a prominent extrarenal pelvis without obstructive change.  Large redundant sigmoid colon is noted. Diverticular change is seen without diverticulitis. The appendix is within normal limits. There is concentric wall thickening identified within the rectum consistent with the patient's given clinical history. No significant pelvic or retroperitoneal lymphadenopathy is noted.  Multiple stable partially calcified mass lesions are noted superior and to the right of the urinary bladder likely representing calcified uterine fibroids. By history the patient has had a hysterectomy. These changes are unchanged from the previous exams dating back to 2007.  Diffuse chronic increased sclerosis in the pelvic bones and spine is noted. This may be related to some underlying Paget's disease. This is stable over multiple previous exams.  IMPRESSION: Changes consistent with the known history of rectal carcinoma.  Multiple stable calcified  pelvic masses likely related to uterine fibroids. There is a given history of hysterectomy however these changes are stable since 2007.  Chronic changes in the left kidney.  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   Electronically Signed   By: Alcide Clever M.D.   On: 02/09/2013 16:06    PATHOLOGY: FINAL for KELSY, POLACK ([id number]) Patient: JARIANA, SHUMARD Collected: 02/04/2013 Client: [facility] Accession: [id number] Received: 02/05/2013 Lionel December DOB: Oct 21, 1950 Age: 48 Gender: F Reported: 02/08/2013 [address] Patient Ph: [phone] MRN #: [id number] [place] [address] Visit #: [id number] Chart #: Phone: [phone] Fax: CC: REPORT OF SURGICAL PATHOLOGY FINAL DIAGNOSIS Diagnosis Rectum, biopsy - INVASIVE ADENOCARCINOMA ARISING IN A BACKGROUND OF TUBULAR ADENOMA. Microscopic Comment Dr. Karilyn Cota was notified on 02/08/13. (HCL:gt, 02/08/13) Abigail Miyamoto MD Pathologist, Electronic Signature (Case signed 02/08/2013) Specimen Gross and Clinical Information Specimen(s) Obtained: Rectum, biopsy Specimen Clinical Information poor prep, rectal mass (kp) Gross Received in formalin are tan, soft tissue fragments that are submitted in toto. Number: seven, Size: 0.1 to 0.3 cm, one block. (SW:caf 02/05/13) Report signed out from the following location(s) Technical Component performed at [facility] [address],[place], [place] [address] CLIA: [id number]., Interpretation performed at [facility] [address], [place], [place] [address]. CLIA #: [id number],  IMPRESSION:  #1. Locally advanced adenocarcinoma the rectum. To undergo PET scan for staging. #2. Iron deficiency anemia, on iron supplements. #3. Diabetes mellitus, type II, insulin requiring. #4. Asthma, on treatment. #5. Hypertension, on treatment. #6. Mental retardation. #7. Seizure disorder. #8. Episode of gastritis, Helicobacter pylori positive, treated,  improved. #9. Allergic rhinitis.   PLAN:  #1. PET CT scan for staging. #2. To see Dr. Lovell Sheehan on 02/25/2013. #3. Appointment with Dr. Thersa Salt for preop combined modality treatment utilizing oral Xeloda plus daily radiotherapy with Xeloda given at 1500 mg twice a day on the days of radiation. #4. Followup when radiotherapy has been set up and the patient has obtained Xeloda.  I appreciate the opportunity sharing in her care.   Maurilio Lovely, MD 02/23/2013 4:18 PM

## 2013-02-23 NOTE — Patient Instructions (Signed)
[facility] Discharge Instructions  RECOMMENDATIONS MADE BY THE CONSULTANT AND ANY TEST RESULTS WILL BE SENT TO YOUR REFERRING PHYSICIAN.  EXAM FINDINGS BY THE PHYSICIAN TODAY AND SIGNS OR SYMPTOMS TO REPORT TO CLINIC OR PRIMARY PHYSICIAN: Exam and findings as discussed by Dr. Zigmund Daniel.   Need to check some blood work today and get you scheduled for a PET scan.   PET scan will be done at [facility] on 12/19 at 8:45am.  Nothing to eat or drink 6 hours prior to the scan.  No chewing gum, hard candy,etc.  Do not take the morning insulin dosage prior to the PET scan.  Will make an appointment for radiation consultation at [facility].  Once we know when your radiation is to start we will get you started on Xeloda which you will take while you are receiving radiation.  We will call you with the date and time of your appointment. The xeloda is an oral chemotherapy pill that we will need to get for you from a specialty pharmacy. MEDICATIONS PRESCRIBED:  none  INSTRUCTIONS/FOLLOW-UP: Follow-up in 2 weeks.  Thank you for choosing [facility] to provide your oncology and hematology care.  To afford each patient quality time with our providers, please arrive at least 15 minutes before your scheduled appointment time.  With your help, our goal is to use those 15 minutes to complete the necessary work-up to ensure our physicians have the information they need to help with your evaluation and healthcare recommendations.    Effective January 1st, 2014, we ask that you re-schedule your appointment with our physicians should you arrive 10 or more minutes late for your appointment.  We strive to give you quality time with our providers, and arriving late affects you and other patients whose appointments are after yours.    Again, thank you for choosing [facility].  Our hope is that these requests will decrease the amount of time that you  wait before being seen by our physicians.       _____________________________________________________________  Should you have questions after your visit to [facility], please contact our office at [phone] between the hours of 8:30 a.m. and 5:00 p.m.  Voicemails left after 4:30 p.m. will not be returned until the following business day.  For prescription refill requests, have your pharmacy contact our office with your prescription refill request.

## 2013-02-24 LAB — FERRITIN: Ferritin: 15 ng/mL (ref 10–291)

## 2013-02-24 LAB — CEA: CEA: 19.8 ng/mL — ABNORMAL HIGH (ref 0.0–5.0)

## 2013-02-25 ENCOUNTER — Telehealth (HOSPITAL_COMMUNITY): Payer: Self-pay | Admitting: Hematology and Oncology

## 2013-02-25 MED ORDER — DIPHENOXYLATE-ATROPINE 2.5-0.025 MG PO TABS: ORAL_TABLET | ORAL | Status: DC

## 2013-02-25 NOTE — Patient Instructions (Signed)
[facility]  [facility]  CHEMOTHERAPY INSTRUCTIONS  Xeloda -    Side Effects:   Diarrhea, hand-foot syndrome (hands/feet can get red/tender/and skin can peel). Avoid friction and hot environments on hands/feet. Wear cotton socks. Lotion twice a day to hands/fingers/feet/toes with Udder cream. Mucositis (inflammation of any mucosal membrane can develop -this can occur in the throat/mouth). Mouth sores, nausea/vomiting, anemia, fatigue can also develop. Xeloda usually comes with a teaching packet from the mail order/specialty pharmacy that will supply you with the drug that is really informative about diarrhea and hand-foot syndrome.   No pregnant, child bearing age people, or animals should come into contact with this drug. Even though this is in pill form - it is still very powerful!!! If you should be instructed to discontinue taking this drug, bring the drug into the Cancer Clinic and we will dispose of it in a safe manner. Chemotherapy is a biohazard and must be disposed of properly.   The patient should not touch this pill much. The caregiver/med tech needs to wear gloves when administering/handling. Do not put this pill in with the rest of your pills in a pill box. Keep them in a separate pill box. If you are taking Coumadin while taking this drug, we will need to monitor your PT/INR (lab) closely because Xeloda can increase the effectiveness of Coumadin (make your blood thinner than desired).   Administration: Xeloda 500mg  tablet. On the days of radiation, take 3 tablets (1500mg  total) 30 minutes after breakfast and take 3 tablets 30 minutes after supper. Take with a full glass of water.   The name of the Specialty Pharmacy that will be supplying you with Xeloda is [facility]. Phone # is [phone]. Fax is [phone].   POTENTIAL SIDE EFFECTS OF TREATMENT:  Increased Susceptibility to Infection/Bone Marrow Suppression, Nausea/Vomiting, Diarrhea/Constipation/Abdominal  Cramping, Hair Thinning, Changes in Character of Skin and Nails (brittleness, dryness,etc.), Sun Sensitivity and Mouth Sores   EDUCATIONAL MATERIALS GIVEN AND REVIEWED:  Chemotherapy and You  Specific Instruction Sheets on Xeloda/Lomotil  SELF CARE ACTIVITIES WHILE ON CHEMOTHERAPY:  Increase your fluid intake to 64oz of decaffeinated fluids daily. No alcohol intake. No aspirin or other medications unless approved by your oncologist. Eat foods that are light and easy to digest. Eat foods at cold or room temperature. No fried, fatty, or spicy foods immediately before or after treatment. Have teeth cleaned professionally before starting treatment. Keep dentures and partial plates clean. Use soft toothbrush and do not use mouthwashes that contain alcohol. Biotene is a good mouthwash. Use warm salt water gargles (1 teaspoon salt per 1 quart warm water) before and after meals and at bedtime. Or you may rinse with 2 tablespoons of three -percent hydrogen peroxide mixed in eight ounces of water. Always use sunscreen with SPF (Sun Protection Factor) of 30 or higher. Use your nausea medication as directed to prevent nausea. Use your stool softener or laxative as directed to prevent constipation. and Use your anti-diarrheal medication as directed to stop diarrhea.  Please wash your hands for at least 30 seconds using warm soapy water. Handwashing is the #1 way to prevent the spread of germs. Stay away from sick people or people who are getting over a cold. If you develop respiratory systems such as green/yellow mucus production or productive cough or persistent cough let us know and we will see if you need an antibiotic. It is a good idea to keep a pair of gloves on when going into  grocery stores/[employer] to decrease your risk of coming into contact with germs on the carts, etc. Carry alcohol hand gel with you at all times and use it frequently if out in public. All foods need to be cooked thoroughly. No raw foods. No  medium or undercooked meats, eggs. If your food is cooked medium well, it does not need to be hot pink or saturated with bloody liquid at all. Vegetables and fruits need to be washed/rinsed under the faucet with a dish detergent before being consumed. You can eat raw fruits and vegetables unless we tell you otherwise but it would be best if you cooked them or bought frozen. Do not eat off of salad bars or hot bars unless you really trust the cleanliness of the restaurant. If you need dental work, please let us know before you go for your appointment so that we can coordinate the best possible time for you in regards to your chemo regimen. You need to also let your dentist know that you are actively taking chemo. We may need to do labs prior to your dental appointment. We also want your bowels moving at least every other day. If this is not happening, we need to know so that we can get you on a bowel regimen to help you go.    MEDICATIONS:  You have been given prescriptions for the following medications:   Xeloda 500mg  tablet. On the days of radiation, take 3 tablets (1500mg  total) 30 minutes after breakfast and take 3 tablets 30 minutes after supper. Take with a full glass of water.   Lomotil. Take 1 tablet every 2 hours as needed for diarrhea/loose stools.   Over-the-Counter Meds:   Colace - this is a stool softener. Take 100mg  capsule 2-6 times a day as needed. If you have to take more than 6 capsules of Colace a day call the Cancer Center.   Senna - this is a mild laxative used to treat mild constipation. May take up to 3 tabs by mouth nightly @ bedtime prn.   Milk of Magnesia - this is a laxative used to treat moderate to severe constipation. May take 2-4 tablespoons every 8 hours as needed. May increase to 8 tablespoons x 1 dose and if no bowel movement call the Cancer Center.   SYMPTOMS TO REPORT AS SOON AS POSSIBLE AFTER TREATMENT:  FEVER GREATER THAN 100.5 F  CHILLS WITH OR WITHOUT FEVER   NAUSEA AND VOMITING THAT IS NOT CONTROLLED WITH YOUR NAUSEA MEDICATION  UNUSUAL SHORTNESS OF BREATH  UNUSUAL BRUISING OR BLEEDING  TENDERNESS IN MOUTH AND THROAT WITH OR WITHOUT PRESENCE OF ULCERS  URINARY PROBLEMS  BOWEL PROBLEMS UNUSUAL RASH   Wear comfortable clothing and clothing appropriate for easy access to any Portacath or PICC line. Let us know if there is anything that we can do to make your therapy better!    I have been informed and understand all of the instructions given to me and have received a copy. I have been instructed to call the clinic [phone] or my family physician as soon as possible for continued medical care, if indicated. I do not have any more questions at this time but understand that I may call the Cancer Center or the Patient Navigator at [phone] during office hours should I have questions or need assistance in obtaining follow-up care.   ________________________________________ Signature of Patient or Authorized Representative           Capecitabine tablets What is  this medicine? CAPECITABINE (ka pe SITE a been) is a chemotherapy drug. It slows the growth of cancer cells. This medicine is used to treat breast cancer, and also colon or rectal cancer. This medicine may be used for other purposes; ask your health care provider or pharmacist if you have questions. COMMON BRAND NAME(S): Xeloda What should I tell my health care provider before I take this medicine? They need to know if you have any of these conditions: -bleeding or blood disorders -dihydropyrimidine dehydrogenase (DPD) deficiency -heart disease -infection (especially a virus infection such as chickenpox, cold sores, or herpes) -kidney disease -liver disease -an unusual or allergic reaction to capecitabine, 5-fluorouracil, other medicines, foods, dyes, or preservatives -pregnant or trying to get pregnant -breast-feeding How should I use this medicine? Take this  medicine by mouth with a glass of water, within 30 minutes of the end of a meal. Follow the directions on the prescription label. Take your medicine at regular intervals. Do not take it more often than directed. Do not stop taking except on your doctor's advice. Your doctor may want you to take a combination of 150 mg and 500 mg tablets for each dose. It is very important that you know how to correctly take your dose. Taking the wrong tablets could result in an overdose (too much medication) or underdose (too little medication). Talk to your pediatrician regarding the use of this medicine in children. Special care may be needed. Overdosage: If you think you have taken too much of this medicine contact a poison control center or emergency room at once. NOTE: This medicine is only for you. Do not share this medicine with others. What if I miss a dose? If you miss a dose, do not take the missed dose at all. Do not take double or extra doses. Instead, continue with your next scheduled dose and check with your doctor. What may interact with this medicine? -antacids with aluminum and/or magnesium -folic acid -leucovorin -medicines to increase blood counts like filgrastim, pegfilgrastim, sargramostim -phenytoin -vaccines -warfarin Talk to your doctor or health care professional before taking any of these medicines: -acetaminophen -aspirin -ibuprofen -ketoprofen -naproxen This list may not describe all possible interactions. Give your health care provider a list of all the medicines, herbs, non-prescription drugs, or dietary supplements you use. Also tell them if you smoke, drink alcohol, or use illegal drugs. Some items may interact with your medicine. What should I watch for while using this medicine? Visit your doctor for checks on your progress. This drug may make you feel generally unwell. This is not uncommon, as chemotherapy can affect healthy cells as well as cancer cells. Report any side  effects. Continue your course of treatment even though you feel ill unless your doctor tells you to stop. In some cases, you may be given additional medicines to help with side effects. Follow all directions for their use. Call your doctor or health care professional for advice if you get a fever, chills or sore throat, or other symptoms of a cold or flu. Do not treat yourself. This drug decreases your body's ability to fight infections. Try to avoid being around people who are sick. This medicine may increase your risk to bruise or bleed. Call your doctor or health care professional if you notice any unusual bleeding. Be careful brushing and flossing your teeth or using a toothpick because you may get an infection or bleed more easily. If you have any dental work done, tell your dentist you are  receiving this medicine. Avoid taking products that contain aspirin, acetaminophen, ibuprofen, naproxen, or ketoprofen unless instructed by your doctor. These medicines may hide a fever. Do not become pregnant while taking this medicine. Women should inform their doctor if they wish to become pregnant or think they might be pregnant. There is a potential for serious side effects to an unborn child. Talk to your health care professional or pharmacist for more information. Do not breast-feed an infant while taking this medicine. Men are advised not to father a child while taking this medicine. What side effects may I notice from receiving this medicine? Side effects that you should report to your doctor or health care professional as soon as possible: -allergic reactions like skin rash, itching or hives, swelling of the face, lips, or tongue -low blood counts - this medicine may decrease the number of white blood cells, red blood cells and platelets. You may be at increased risk for infections and bleeding. -signs of infection - fever or chills, cough, sore throat, pain or difficulty passing urine -signs of  decreased platelets or bleeding - bruising, pinpoint red spots on the skin, black, tarry stools, blood in the urine -signs of decreased red blood cells - unusually weak or tired, fainting spells, lightheadedness -breathing problems -changes in vision -chest pain -diarrhea of more than 4 bowel movements in one day or any diarrhea at night -mouth sores -nausea and vomiting -pain, swelling, redness at site where injected -pain, tingling, numbness in the hands or feet -redness, swelling, or sores on hands or feet -stomach pain -vomiting -yellow color of skin or eyes Side effects that usually do not require medical attention (report to your doctor or health care professional if they continue or are bothersome): -constipation -diarrhea -dry or itchy skin -hair loss -loss of appetite -nausea -weak or tired This list may not describe all possible side effects. Call your doctor for medical advice about side effects. You may report side effects to FDA at 1-800-FDA-1088. Where should I keep my medicine? Keep out of the reach of children. Store at room temperature between 15 and 30 degrees C (59 and 86 degrees F). Keep container tightly closed. Throw away any unused medicine after the expiration date. NOTE: This sheet is a summary. It may not cover all possible information. If you have questions about this medicine, talk to your doctor, pharmacist, or health care provider.  2014, Elsevier/Gold Standard. (2008-02-15 11:47:41) Atropine; Diphenoxylate tablets What is this medicine? ATROPINE; DIPHENOXYLATE (A troe peen dye fen OX i late) is used to treat diarrhea. This medicine may be used for other purposes; ask your health care provider or pharmacist if you have questions. COMMON BRAND NAME(S): Lomotil, Lonox , Vi-Atro What should I tell my health care provider before I take this medicine? They need to know if you have any of these conditions: -bacterial food  poisoning -colitis -dehydration -Down's syndrome -jaundice or liver disease -an unusual or allergic reaction to atropine, diphenoxylate, other medicines, foods, dyes, or preservatives -pregnant or trying to get pregnant -breast-feeding How should I use this medicine? Take this medicine by mouth with a glass of water. Follow the directions on the prescription label. You can take the tablets with food. Take your doses at regular intervals. Do not take your medicine more often than directed. Once your diarrhea has been brought under control your doctor or health care professional may reduce your doses. Talk to your pediatrician regarding the use of this medicine in children. Special care may be  needed. Elderly patients may be more sensitive to the effects of this medicine. Overdosage: If you think you have taken too much of this medicine contact a poison control center or emergency room at once. NOTE: This medicine is only for you. Do not share this medicine with others. What if I miss a dose? If you miss a dose, take it as soon as you can. If it is almost time for your next dose, take only that dose. Do not take double or extra doses. What may interact with this medicine? -alcohol -antihistamines for allergy, cough and cold -barbiturate medicines for inducing sleep or treating seizures -certain medicines for depression, anxiety, or psychotic disturbances -certain medicines for sleep -medicines for movement abnormalities as in Parkinson's disease, or for gastrointestinal problems -muscle relaxants -narcotic medicines (opiates) for pain This list may not describe all possible interactions. Give your health care provider a list of all the medicines, herbs, non-prescription drugs, or dietary supplements you use. Also tell them if you smoke, drink alcohol, or use illegal drugs. Some items may interact with your medicine. What should I watch for while using this medicine? If your symptoms do not  start to get better after taking this medicine for two days, check with your doctor or health care professional, you may have a problem that needs further evaluation. Check with your doctor or health care professional right away if you develop a fever or bloody diarrhea. You may get drowsy or dizzy. Do not drive, use machinery, or do anything that needs mental alertness until you know how this medicine affects you. Alcohol can increase possible drowsiness and dizziness. Avoid alcoholic drinks. Your mouth may get dry. Chewing sugarless gum or sucking hard candy, and drinking plenty of water may help. Contact your doctor if the problem does not go away or is severe. Drinking plenty of water can also help prevent dehydration that can occur with diarrhea. What side effects may I notice from receiving this medicine? Side effects that you should report to your doctor or health care professional as soon as possible: -allergic reactions like skin rash, itching or hives, swelling of the face, lips, or tongue -bloated, swollen feeling -breathing problems -changes in vision -fast, irregular heartbeat -stomach pain Side effects that usually do not require medical attention (report to your doctor or health care professional if they continue or are bothersome): -headache -loss of appetite -mood changes -nausea, vomiting -numbness or tingling in the hands and feet This list may not describe all possible side effects. Call your doctor for medical advice about side effects. You may report side effects to FDA at 1-800-FDA-1088. Where should I keep my medicine? Keep out of the reach of children. This medicine can be abused. Keep your medicine in a safe place to protect it from theft. Do not share this medicine with anyone. Selling or giving away this medicine is dangerous and against the law. Store at room temperature between 15 and 30 degrees C (59 and 86 degrees F). Protect from light. Keep container tightly  closed.  Throw away any unused medicine after the expiration date. Discard unused medicine and used packaging carefully. Pets and children can be harmed if they find used or lost packages. NOTE: This sheet is a summary. It may not cover all possible information. If you have questions about this medicine, talk to your doctor, pharmacist, or health care provider.  2014, Elsevier/Gold Standard. (2010-11-13 15:32:45) Cancer, Radiation Treatment Radiation therapy uses ionizing beams for killing cancer cells and shrinking  tumors. Radiation damages both cancer cells and normal cells, but normal cells have the DNA to repair themselves. Cancer cells do not have DNA to repair themselves and therefore, are killed off by the radiation, and the body disposes of them. The goal of therapy is to kill as many cancer cells without causing harm to healthy cells near the cancer. Radiation therapy may also be used to reduce pain (palliative therapy).  RADIATION IS USED FOR DIFFERENT REASONS  As the primary or only treatment for the cancer.  Used before surgery to shrink a tumor.  Used after surgery to stop the growth of any remaining cancer cells.  Used in combination with other treatments to kill cancer cells.  Used in advanced cancer patients to help with symptoms of the cancer. RADIATION THERAPY MAY BE EXTERNAL OR INTERNAL  External beam radiation is used most often. It is given on an outpatient basis. This means you do not have to be hospitalized. The energy (source of radiation) used in external radiation therapy may come from X-rays or gamma rays. Although they are produced in different ways, both use packets of energy (photons). Lower energy beams can be used to destroy cancer cells on the surface of the body. Higher energy beams are used to treat cancer deep within the body. Compared with some other types of radiation, x-rays can deliver radiation to a fairly large area.  Internal radiation is called  brachytherapy. There is a lose dose rate (LDR) where permanent seeds are implanted into the patient, usually used for GYN or Prostate cancer. This procedure is an inpatient procedure. High dose rate brachytherapy (HDR) is another type of radiation, which is an outpatient procedure. Needles are inserted into the patient, and the radiation travels through these needles to deliver the dose prescribed. This procedure is mainly used for prostate and breast cancers. Both of these types use a live source of radiation. Radiation therapy may be used to treat almost all types of tumors. It is also used to treat leukemia and lymphoma. There are a few that are more radio resistant and may not respond to radiation alone. These are cancers of the blood-forming cells and lymphatic system.  For some types of cancer, radiation may be given to areas that do not have evidence of cancer. This is done to prevent cancer cells from growing in the area receiving the radiation. This technique is called prophylactic radiation therapy.  RISKS AND COMPLICATIONS Side effects of radiation therapy depend on which part of your body is exposed to radiation and how much radiation is used. Most people are affected by fatigue, which is the most common side effect.  Everybody deals with radiation differently. You cannot predict what the side effects will be. They do not occur right away, and it can take 2-3 weeks to develop side effects. Most side effects are temporary and can be controlled. Once the treatment is complete, the side effects do not stop right away. It can take up to 3-4 weeks to regain your energy or for the redness to go away. Your body does heal from the radiation. Some common side effects are:  Difficulty swallowing, coughing (head and neck cancers and lung cancers).  Feeling sick to your stomach (nauseous), vomiting and/or diarrhea (abdominal area or pelvis being treated).  Bladder problems, frequent urination and/or  sexual dysfunction (bladder, kidney, prostate cancer).  Hair loss (brain tumors). BEFORE THE PROCEDURE There will be a planning simulation usually in the radiation department using a CT planning  scan. Your radiation oncologist will plan exactly where the radiation will be delivered. The treatment fields will be planned just for you in order to treat you the best way possible. They also make a plan to avoid critical structures in the body. You may also be given a dye (contrast) during this CT planning scan or an MRI.  You will be positioned how you will be everyday for your treatment. The goal is to have a position that can be reproduced daily.  Usually, temporary marks will be placed on your body to give the therapists a place to shift from once the plan is complete. Then, tattoos are usually put on you in order for you to line up in the same way each day. These tattoos are permanent, but are the size of a freckle. PROCEDURE  You will lie perfectly still on a table in the position determined for treatment, while the linear accelerator moves around you.  This machine delivers the radiation in exact doses from many possible angles.  Treatments are usually spread out over several weeks. This is to allow your healthy cells to recover between sessions. Usually, treatments are spread out between 5-6 weeks, but this is determined by your radiation oncologist.  There is no pain during this treatment. You will not feel the radiation being delivered. The treatment takes about 15-20 minutes, including setup time. AFTER THE PROCEDURE You will have tests and follow-up appointments. They are usually 6 weeks to 6 months after radiation to see if the treatment helped reduce pain or eliminate the cancer cells. Document Released: 07/21/2008 Document Revised: 05/27/2011 Document Reviewed: 07/21/2008 [facility] Patient Information 2014 [place], [place].

## 2013-02-25 NOTE — Patient Instructions (Addendum)
[facility]   CHEMOTHERAPY INSTRUCTIONS  Xeloda -   Side Effects:  Diarrhea, hand-foot syndrome (hands/feet can get red/tender/and skin can peel). Avoid friction and hot environments on hands/feet. Wear cotton socks. Lotion twice a day to hands/fingers/feet/toes with Udder cream. Mucositis (inflammation of any mucosal membrane can develop -this can occur in the throat/mouth). Mouth sores, nausea/vomiting, anemia, fatigue can also develop. Xeloda usually comes with a teaching packet from the mail order/specialty pharmacy that will supply you with the drug that is really informative about diarrhea and hand-foot syndrome.   No pregnant, child bearing age people, or animals should come into contact with this drug. Even though this is in pill form - it is still very powerful!!! If you should be instructed to discontinue taking this drug, bring the drug into the Cancer Clinic and we will dispose of it in a safe manner. Chemotherapy is a biohazard and must be disposed of properly.   The patient should not touch this pill much. The caregiver/med tech needs to wear gloves when administering/handling. Do not put this pill in with the rest of your pills in a pill box. Keep them in a separate pill box. If you are taking Coumadin while taking this drug, we will need to monitor your PT/INR (lab) closely because Xeloda can increase the effectiveness of Coumadin (make your blood thinner than desired).   Administration: Xeloda 500mg  tablet. On the days of radiation, take 3 tablets (1500mg  total) 30 minutes after breakfast and take 3 tablets 30 minutes after supper. Take with a full glass of water.    The name of the Specialty Pharmacy that will be supplying you with Xeloda is [facility]. Phone # is [phone]. Fax is [phone].    POTENTIAL SIDE EFFECTS OF TREATMENT: Increased Susceptibility to Infection/Bone Marrow Suppression, Nausea/Vomiting,  Diarrhea/Constipation/Abdominal Cramping, Hair Thinning, Changes in Character of Skin and Nails (brittleness, dryness,etc.), Sun Sensitivity and Mouth Sores   EDUCATIONAL MATERIALS GIVEN AND REVIEWED: Chemotherapy and You Specific Instruction Sheets on Xeloda  SELF CARE ACTIVITIES WHILE ON CHEMOTHERAPY: Increase your fluid intake to 64oz of decaffeinated fluids daily. No alcohol intake. No aspirin or other medications unless approved by your oncologist. Eat foods that are light and easy to digest.  Eat foods at cold or room temperature.  No fried, fatty, or spicy foods immediately before or after treatment. Have teeth cleaned professionally before starting treatment. Keep dentures and partial plates clean. Use soft toothbrush and do not use mouthwashes that contain alcohol. Biotene is a good mouthwash. Use warm salt water gargles (1 teaspoon salt per 1 quart warm water) before and after meals and at bedtime. Or you may rinse with 2 tablespoons of three -percent hydrogen peroxide mixed in eight ounces of water. Always use sunscreen with SPF (Sun Protection Factor) of 30 or higher. Use your nausea medication as directed to prevent nausea. Use your stool softener or laxative as directed to prevent constipation. and Use your anti-diarrheal medication as directed to stop diarrhea.  Please wash your hands for at least 30 seconds using warm soapy water. Handwashing is the #1 way to prevent the spread of germs. Stay away from sick people or people who are getting over a cold. If you develop respiratory systems such as green/yellow mucus production or productive cough or persistent cough let us know and we will see if you need an antibiotic. It is a good idea to keep a pair of gloves on when going into grocery stores/[employer]  to decrease your risk of coming into contact with germs on the carts, etc. Carry alcohol hand gel with you at all times and use it frequently if out in public. All foods need to be cooked  thoroughly. No raw foods. No medium or undercooked meats, eggs. If your food is cooked medium well, it does not need to be hot pink or saturated with bloody liquid at all. Vegetables and fruits need to be washed/rinsed under the faucet with a dish detergent before being consumed. You can eat raw fruits and vegetables unless we tell you otherwise but it would be best if you cooked them or bought frozen. Do not eat off of salad bars or hot bars unless you really trust the cleanliness of the restaurant. If you need dental work, please let us know before you go for your appointment so that we can coordinate the best possible time for you in regards to your chemo regimen. You need to also let your dentist know that you are actively taking chemo. We may need to do labs prior to your dental appointment. We also want your bowels moving at least every other day. If this is not happening, we need to know so that we can get you on a bowel regimen to help you go.       MEDICATIONS: You have been given prescriptions for the following medications:  Xeloda 500mg  tablet. On the days of radiation, take 3 tablets (1500mg  total) 30 minutes after breakfast and take 3 tablets 30 minutes after supper. Take with a full glass of water.   Lomotil. Take 1 tablet every 2 hours as needed for diarrhea/loose stools.   Over-the-Counter Meds:  Colace - this is a stool softener. Take 100mg  capsule 2-6 times a day as needed. If you have to take more than 6 capsules of Colace a day call the Cancer Center.  Senna - this is a mild laxative used to treat mild constipation. May take up to 3 tabs by mouth nightly @ bedtime prn.   Milk of Magnesia - this is a laxative used to treat moderate to severe constipation. May take 2-4 tablespoons every 8 hours as needed. May increase to 8 tablespoons x 1 dose and if no bowel movement call the Cancer Center.    SYMPTOMS TO REPORT AS SOON AS POSSIBLE AFTER TREATMENT:  FEVER GREATER THAN  100.5 F  CHILLS WITH OR WITHOUT FEVER  NAUSEA AND VOMITING THAT IS NOT CONTROLLED WITH YOUR NAUSEA MEDICATION  UNUSUAL SHORTNESS OF BREATH  UNUSUAL BRUISING OR BLEEDING  TENDERNESS IN MOUTH AND THROAT WITH OR WITHOUT PRESENCE OF ULCERS  URINARY PROBLEMS  BOWEL PROBLEMS  UNUSUAL RASH    Wear comfortable clothing and clothing appropriate for easy access to any Portacath or PICC line. Let us know if there is anything that we can do to make your therapy better!      I have been informed and understand all of the instructions given to me and have received a copy. I have been instructed to call the clinic [phone] or my family physician as soon as possible for continued medical care, if indicated. I do not have any more questions at this time but understand that I may call the Cancer Center or the Patient Navigator at [phone] during office hours should I have questions or need assistance in obtaining follow-up care.      _________________________________________      _______________     __________ Signature of Patient or  Authorized Representative        Date                            Time      _________________________________________ Nurse's Signature      Capecitabine tablets What is this medicine? CAPECITABINE (ka pe SITE a been) is a chemotherapy drug. It slows the growth of cancer cells. This medicine is used to treat breast cancer, and also colon or rectal cancer. This medicine may be used for other purposes; ask your health care provider or pharmacist if you have questions. COMMON BRAND NAME(S): Xeloda What should I tell my health care provider before I take this medicine? They need to know if you have any of these conditions: -bleeding or blood disorders -dihydropyrimidine dehydrogenase (DPD) deficiency -heart disease -infection (especially a virus infection such as chickenpox, cold sores, or herpes) -kidney disease -liver disease -an unusual  or allergic reaction to capecitabine, 5-fluorouracil, other medicines, foods, dyes, or preservatives -pregnant or trying to get pregnant -breast-feeding How should I use this medicine? Take this medicine by mouth with a glass of water, within 30 minutes of the end of a meal. Follow the directions on the prescription label. Take your medicine at regular intervals. Do not take it more often than directed. Do not stop taking except on your doctor's advice. Your doctor may want you to take a combination of 150 mg and 500 mg tablets for each dose. It is very important that you know how to correctly take your dose. Taking the wrong tablets could result in an overdose (too much medication) or underdose (too little medication). Talk to your pediatrician regarding the use of this medicine in children. Special care may be needed. Overdosage: If you think you have taken too much of this medicine contact a poison control center or emergency room at once. NOTE: This medicine is only for you. Do not share this medicine with others. What if I miss a dose? If you miss a dose, do not take the missed dose at all. Do not take double or extra doses. Instead, continue with your next scheduled dose and check with your doctor. What may interact with this medicine? -antacids with aluminum and/or magnesium -folic acid -leucovorin -medicines to increase blood counts like filgrastim, pegfilgrastim, sargramostim -phenytoin -vaccines -warfarin Talk to your doctor or health care professional before taking any of these medicines: -acetaminophen -aspirin -ibuprofen -ketoprofen -naproxen This list may not describe all possible interactions. Give your health care provider a list of all the medicines, herbs, non-prescription drugs, or dietary supplements you use. Also tell them if you smoke, drink alcohol, or use illegal drugs. Some items may interact with your medicine. What should I watch for while using this  medicine? Visit your doctor for checks on your progress. This drug may make you feel generally unwell. This is not uncommon, as chemotherapy can affect healthy cells as well as cancer cells. Report any side effects. Continue your course of treatment even though you feel ill unless your doctor tells you to stop. In some cases, you may be given additional medicines to help with side effects. Follow all directions for their use. Call your doctor or health care professional for advice if you get a fever, chills or sore throat, or other symptoms of a cold or flu. Do not treat yourself. This drug decreases your body's ability to fight infections. Try to avoid being around people who are sick. This  medicine may increase your risk to bruise or bleed. Call your doctor or health care professional if you notice any unusual bleeding. Be careful brushing and flossing your teeth or using a toothpick because you may get an infection or bleed more easily. If you have any dental work done, tell your dentist you are receiving this medicine. Avoid taking products that contain aspirin, acetaminophen, ibuprofen, naproxen, or ketoprofen unless instructed by your doctor. These medicines may hide a fever. Do not become pregnant while taking this medicine. Women should inform their doctor if they wish to become pregnant or think they might be pregnant. There is a potential for serious side effects to an unborn child. Talk to your health care professional or pharmacist for more information. Do not breast-feed an infant while taking this medicine. Men are advised not to father a child while taking this medicine. What side effects may I notice from receiving this medicine? Side effects that you should report to your doctor or health care professional as soon as possible: -allergic reactions like skin rash, itching or hives, swelling of the face, lips, or tongue -low blood counts - this medicine may decrease the number of white  blood cells, red blood cells and platelets. You may be at increased risk for infections and bleeding. -signs of infection - fever or chills, cough, sore throat, pain or difficulty passing urine -signs of decreased platelets or bleeding - bruising, pinpoint red spots on the skin, black, tarry stools, blood in the urine -signs of decreased red blood cells - unusually weak or tired, fainting spells, lightheadedness -breathing problems -changes in vision -chest pain -diarrhea of more than 4 bowel movements in one day or any diarrhea at night -mouth sores -nausea and vomiting -pain, swelling, redness at site where injected -pain, tingling, numbness in the hands or feet -redness, swelling, or sores on hands or feet -stomach pain -vomiting -yellow color of skin or eyes Side effects that usually do not require medical attention (report to your doctor or health care professional if they continue or are bothersome): -constipation -diarrhea -dry or itchy skin -hair loss -loss of appetite -nausea -weak or tired This list may not describe all possible side effects. Call your doctor for medical advice about side effects. You may report side effects to FDA at 1-800-FDA-1088. Where should I keep my medicine? Keep out of the reach of children. Store at room temperature between 15 and 30 degrees C (59 and 86 degrees F). Keep container tightly closed. Throw away any unused medicine after the expiration date. NOTE: This sheet is a summary. It may not cover all possible information. If you have questions about this medicine, talk to your doctor, pharmacist, or health care provider.  2014, Elsevier/Gold Standard. (2008-02-15 11:47:41) Atropine; Diphenoxylate tablets What is this medicine? ATROPINE; DIPHENOXYLATE (A troe peen dye fen OX i late) is used to treat diarrhea. This medicine may be used for other purposes; ask your health care provider or pharmacist if you have questions. COMMON BRAND NAME(S):  Lomotil, Lonox , Vi-Atro What should I tell my health care provider before I take this medicine? They need to know if you have any of these conditions: -bacterial food poisoning -colitis -dehydration -Down's syndrome -jaundice or liver disease -an unusual or allergic reaction to atropine, diphenoxylate, other medicines, foods, dyes, or preservatives -pregnant or trying to get pregnant -breast-feeding How should I use this medicine? Take this medicine by mouth with a glass of water. Follow the directions on the prescription label. You  can take the tablets with food. Take your doses at regular intervals. Do not take your medicine more often than directed. Once your diarrhea has been brought under control your doctor or health care professional may reduce your doses. Talk to your pediatrician regarding the use of this medicine in children. Special care may be needed. Elderly patients may be more sensitive to the effects of this medicine. Overdosage: If you think you have taken too much of this medicine contact a poison control center or emergency room at once. NOTE: This medicine is only for you. Do not share this medicine with others. What if I miss a dose? If you miss a dose, take it as soon as you can. If it is almost time for your next dose, take only that dose. Do not take double or extra doses. What may interact with this medicine? -alcohol -antihistamines for allergy, cough and cold -barbiturate medicines for inducing sleep or treating seizures -certain medicines for depression, anxiety, or psychotic disturbances -certain medicines for sleep -medicines for movement abnormalities as in Parkinson's disease, or for gastrointestinal problems -muscle relaxants -narcotic medicines (opiates) for pain This list may not describe all possible interactions. Give your health care provider a list of all the medicines, herbs, non-prescription drugs, or dietary supplements you use. Also tell them if  you smoke, drink alcohol, or use illegal drugs. Some items may interact with your medicine. What should I watch for while using this medicine? If your symptoms do not start to get better after taking this medicine for two days, check with your doctor or health care professional, you may have a problem that needs further evaluation. Check with your doctor or health care professional right away if you develop a fever or bloody diarrhea. You may get drowsy or dizzy. Do not drive, use machinery, or do anything that needs mental alertness until you know how this medicine affects you. Alcohol can increase possible drowsiness and dizziness. Avoid alcoholic drinks. Your mouth may get dry. Chewing sugarless gum or sucking hard candy, and drinking plenty of water may help. Contact your doctor if the problem does not go away or is severe. Drinking plenty of water can also help prevent dehydration that can occur with diarrhea. What side effects may I notice from receiving this medicine? Side effects that you should report to your doctor or health care professional as soon as possible: -allergic reactions like skin rash, itching or hives, swelling of the face, lips, or tongue -bloated, swollen feeling -breathing problems -changes in vision -fast, irregular heartbeat -stomach pain Side effects that usually do not require medical attention (report to your doctor or health care professional if they continue or are bothersome): -headache -loss of appetite -mood changes -nausea, vomiting -numbness or tingling in the hands and feet This list may not describe all possible side effects. Call your doctor for medical advice about side effects. You may report side effects to FDA at 1-800-FDA-1088. Where should I keep my medicine? Keep out of the reach of children. This medicine can be abused. Keep your medicine in a safe place to protect it from theft. Do not share this medicine with anyone. Selling or giving away this  medicine is dangerous and against the law. Store at room temperature between 15 and 30 degrees C (59 and 86 degrees F). Protect from light. Keep container tightly closed.  Throw away any unused medicine after the expiration date. Discard unused medicine and used packaging carefully. Pets and children can be harmed if  they find used or lost packages. NOTE: This sheet is a summary. It may not cover all possible information. If you have questions about this medicine, talk to your doctor, pharmacist, or health care provider.  2014, Elsevier/Gold Standard. (2010-11-13 15:32:45) Cancer, Radiation Treatment Radiation therapy uses ionizing beams for killing cancer cells and shrinking tumors. Radiation damages both cancer cells and normal cells, but normal cells have the DNA to repair themselves. Cancer cells do not have DNA to repair themselves and therefore, are killed off by the radiation, and the body disposes of them. The goal of therapy is to kill as many cancer cells without causing harm to healthy cells near the cancer. Radiation therapy may also be used to reduce pain (palliative therapy).  RADIATION IS USED FOR DIFFERENT REASONS  As the primary or only treatment for the cancer.  Used before surgery to shrink a tumor.  Used after surgery to stop the growth of any remaining cancer cells.  Used in combination with other treatments to kill cancer cells.  Used in advanced cancer patients to help with symptoms of the cancer. RADIATION THERAPY MAY BE EXTERNAL OR INTERNAL  External beam radiation is used most often. It is given on an outpatient basis. This means you do not have to be hospitalized. The energy (source of radiation) used in external radiation therapy may come from X-rays or gamma rays. Although they are produced in different ways, both use packets of energy (photons). Lower energy beams can be used to destroy cancer cells on the surface of the body. Higher energy beams are used to treat  cancer deep within the body. Compared with some other types of radiation, x-rays can deliver radiation to a fairly large area.  Internal radiation is called brachytherapy. There is a lose dose rate (LDR) where permanent seeds are implanted into the patient, usually used for GYN or Prostate cancer. This procedure is an inpatient procedure. High dose rate brachytherapy (HDR) is another type of radiation, which is an outpatient procedure. Needles are inserted into the patient, and the radiation travels through these needles to deliver the dose prescribed. This procedure is mainly used for prostate and breast cancers. Both of these types use a live source of radiation. Radiation therapy may be used to treat almost all types of tumors. It is also used to treat leukemia and lymphoma. There are a few that are more radio resistant and may not respond to radiation alone. These are cancers of the blood-forming cells and lymphatic system.  For some types of cancer, radiation may be given to areas that do not have evidence of cancer. This is done to prevent cancer cells from growing in the area receiving the radiation. This technique is called prophylactic radiation therapy.  RISKS AND COMPLICATIONS Side effects of radiation therapy depend on which part of your body is exposed to radiation and how much radiation is used. Most people are affected by fatigue, which is the most common side effect.  Everybody deals with radiation differently. You cannot predict what the side effects will be. They do not occur right away, and it can take 2-3 weeks to develop side effects. Most side effects are temporary and can be controlled. Once the treatment is complete, the side effects do not stop right away. It can take up to 3-4 weeks to regain your energy or for the redness to go away. Your body does heal from the radiation. Some common side effects are:  Difficulty swallowing, coughing (head and neck cancers  and lung  cancers).  Feeling sick to your stomach (nauseous), vomiting and/or diarrhea (abdominal area or pelvis being treated).  Bladder problems, frequent urination and/or sexual dysfunction (bladder, kidney, prostate cancer).  Hair loss (brain tumors). BEFORE THE PROCEDURE There will be a planning simulation usually in the radiation department using a CT planning scan. Your radiation oncologist will plan exactly where the radiation will be delivered. The treatment fields will be planned just for you in order to treat you the best way possible. They also make a plan to avoid critical structures in the body. You may also be given a dye (contrast) during this CT planning scan or an MRI.  You will be positioned how you will be everyday for your treatment. The goal is to have a position that can be reproduced daily.  Usually, temporary marks will be placed on your body to give the therapists a place to shift from once the plan is complete. Then, tattoos are usually put on you in order for you to line up in the same way each day. These tattoos are permanent, but are the size of a freckle. PROCEDURE  You will lie perfectly still on a table in the position determined for treatment, while the linear accelerator moves around you.  This machine delivers the radiation in exact doses from many possible angles.  Treatments are usually spread out over several weeks. This is to allow your healthy cells to recover between sessions. Usually, treatments are spread out between 5-6 weeks, but this is determined by your radiation oncologist.  There is no pain during this treatment. You will not feel the radiation being delivered. The treatment takes about 15-20 minutes, including setup time. AFTER THE PROCEDURE You will have tests and follow-up appointments. They are usually 6 weeks to 6 months after radiation to see if the treatment helped reduce pain or eliminate the cancer cells. Document Released: 07/21/2008 Document  Revised: 05/27/2011 Document Reviewed: 07/21/2008 [facility] Patient Information 2014 [place], [place].

## 2013-02-25 NOTE — Telephone Encounter (Signed)
XELODA RX FILLED BY [facility] [phone](P) [phone]) THEY ALSO HELPED PT ACQUIRE COPAY ASSIST FOR HER $800 COPAY. ASSIST IS THRU P.A.[facility] FOR $7500.  April Manson Database administrator Medical Oncology [phone]

## 2013-02-26 ENCOUNTER — Ambulatory Visit (HOSPITAL_COMMUNITY): Payer: PRIVATE HEALTH INSURANCE

## 2013-03-05 ENCOUNTER — Encounter (HOSPITAL_COMMUNITY)
Admission: RE | Admit: 2013-03-05 | Discharge: 2013-03-05 | Disposition: A | Discharge status: Home / Self Care | Payer: PRIVATE HEALTH INSURANCE | Source: Ambulatory Visit | Attending: Hematology and Oncology | Admitting: Hematology and Oncology

## 2013-03-05 DIAGNOSIS — C20 Malignant neoplasm of rectum: Secondary | ICD-10-CM | POA: Insufficient documentation

## 2013-03-05 LAB — GLUCOSE, CAPILLARY: Glucose-Capillary: 83 mg/dL (ref 70–99)

## 2013-03-05 MED ORDER — FLUDEOXYGLUCOSE F - 18 (FDG) INJECTION
18.1000 | Freq: Once | INTRAVENOUS | Status: AC | PRN
  Administered 2013-03-05: 18.1 via INTRAVENOUS

## 2013-03-08 ENCOUNTER — Ambulatory Visit (INDEPENDENT_AMBULATORY_CARE_PROVIDER_SITE_OTHER): Payer: Medicare Other | Admitting: Internal Medicine

## 2013-03-09 ENCOUNTER — Encounter (HOSPITAL_COMMUNITY): Payer: PRIVATE HEALTH INSURANCE

## 2013-03-09 ENCOUNTER — Encounter (HOSPITAL_COMMUNITY): Payer: Self-pay

## 2013-03-09 ENCOUNTER — Encounter (HOSPITAL_BASED_OUTPATIENT_CLINIC_OR_DEPARTMENT_OTHER): Payer: PRIVATE HEALTH INSURANCE

## 2013-03-09 VITALS — BP 145/81 | HR 87 | Temp 97.6°F | Resp 16 | Wt 224.8 lb

## 2013-03-09 DIAGNOSIS — D649 Anemia, unspecified: Secondary | ICD-10-CM

## 2013-03-09 DIAGNOSIS — I10 Essential (primary) hypertension: Secondary | ICD-10-CM

## 2013-03-09 DIAGNOSIS — E119 Type 2 diabetes mellitus without complications: Secondary | ICD-10-CM

## 2013-03-09 DIAGNOSIS — D509 Iron deficiency anemia, unspecified: Secondary | ICD-10-CM

## 2013-03-09 DIAGNOSIS — C20 Malignant neoplasm of rectum: Secondary | ICD-10-CM

## 2013-03-09 NOTE — Progress Notes (Signed)
[facility]  OFFICE PROGRESS NOTE  Fredirick Maudlin, MD [address] [place] [address]  DIAGNOSIS: Rectal carcinoma - Plan: CBC with Differential  Anemia, chronic blood loss - Plan: CBC with Differential  Chief Complaint  Patient presents with  . Rectal Cancer    CURRENT THERAPY: For radiotherapy evaluation on 03/16/2013 for treatment planning prior to initiation of combined modality therapy utilizing external beam radiotherapy in conjunction with Xeloda 1500 mg twice a day on the days that radiation therapy is administered.   INTERVAL HISTORY: [name] 62 y.o. female returns for  followup of locally advanced rectal cancer by PET scan.  She still has intermittent rectal bleeding but denies any significant abdominal pain, nausea, vomiting, cough, wheezing, lower extremity swelling or redness, fever, night sweats, cough, headache, skin rash, or seizures.  MEDICAL HISTORY: Past Medical History  Diagnosis Date  . Hypertension   . Diabetes mellitus     years  . Seizures   . Asthma   . Depression   . Mental retardation   . History of recurrent UTIs   . Shortness of breath     with exertion    INTERIM HISTORY: has DIABETES MELLITUS, TYPE II, UNCONTROLLED; HYPERLIPIDEMIA; GOUT NOS; UNSPECIFIED DEFICIENCY ANEMIA; ANEMIA; DEPRESSION; RETARDATION, MENTAL NOS; HYPERTENSION; SINUS TACHYCARDIA; ALLERGIC RHINITIS; ASTHMA; ASTHMA, WITH ACUTE EXACERBATION; DISEASE, PANCREAS NOS; INTERTRIGO, CANDIDAL; ARTHRITIS; SEIZURE DISORDER; UNSPECIFIED SLEEP DISTURBANCE; FLANK PAIN, LEFT; LIVER FUNCTION TESTS, ABNORMAL; MIGRAINES, HX OF; Elevated liver enzymes; Rectal carcinoma; Helicobacter pylori gastritis; and Rectal cancer on her problem list.    ALLERGIES:  has No Known Allergies.  MEDICATIONS: has a current medication list which includes the following prescription(s): aspirin ec, bismuth-metronidazole-tetracycline,  capecitabine, diphenoxylate-atropine, docusate sodium, ferrous sulfate, fish oil-omega-3 fatty acids, fluticasone-salmeterol, hydrocodone-acetaminophen, insulin glargine, lisinopril, loratadine-pseudoephedrine, metformin, metoprolol succinate, montelukast, naftifine hcl, oxycodone-acetaminophen, pantoprazole, phenytoin, pravastatin, saline, and triamcinolone cream.  SURGICAL HISTORY:  Past Surgical History  Procedure Laterality Date  . Abdominal hysterectomy    . Multiple extractions with alveoloplasty N/A 11/23/2012    Procedure: MULTIPLE EXTRACION 1, 2, 4, 5, 6, 7, 8, 9, 10, 11, 12, 13, 14, 17, 18, 20, 23, 24, 25, 26, 28, 29, 32 WITH ALVEOLOPLASTY, REMOVE BILATERAL TORI;  Surgeon: Georgia Lopes, DDS;  Location: [facility] OR;  Service: Oral Surgery;  Laterality: N/A;  . Colonoscopy with esophagogastroduodenoscopy (egd) N/A 01/14/2013    Procedure: COLONOSCOPY WITH ESOPHAGOGASTRODUODENOSCOPY (EGD);  Surgeon: Malissa Hippo, MD;  Location: [facility] ENDO [facility]ITE;  Service: Endoscopy;  Laterality: N/A;  250-moved to 315 Ann to notify pt  . Colonoscopy N/A 02/04/2013    Procedure: COLONOSCOPY;  Surgeon: Malissa Hippo, MD;  Location: [facility];  Service: Endoscopy;  Laterality: N/A;  225  . Eus N/A 02/18/2013    Procedure: LOWER ENDOSCOPIC ULTRASOUND (EUS);  Surgeon: Rachael Fee, MD;  Location: [facility];  Service: Endoscopy;  Laterality: N/A;    FAMILY HISTORY: family history is not on file.  SOCIAL HISTORY:  reports that she has quit smoking. Her smoking use included Cigarettes. She smoked 0.00 packs per day. She has never used smokeless tobacco. She reports that she does not drink alcohol or use illicit drugs.  REVIEW OF SYSTEMS:  Other than that discussed above is noncontributory.  PHYSICAL EXAMINATION: ECOG PERFORMANCE STATUS: 1 - Symptomatic but completely ambulatory  Blood pressure 145/81, pulse 87, temperature 97.6 F (36.4 C), resp. rate 16, weight 224 lb 12.8 oz (  101.969  kg).  GENERAL:alert, no distress and comfortable. Morbidly obese. SKIN: skin color, texture, turgor are normal, no rashes or significant lesions EYES: PERLA; Conjunctiva are pink and non-injected, sclera clear OROPHARYNX:no exudate, no erythema on lips, buccal mucosa, or tongue. NECK: supple, thyroid normal size, non-tender, without nodularity. No masses CHEST: Normal AP diameter with no breast masses. LYMPH:  no palpable lymphadenopathy in the cervical, axillary or inguinal LUNGS: clear to auscultation and percussion with normal breathing effort HEART: regular rate & rhythm and no murmurs. ABDOMEN:abdomen soft, non-tender and normal bowel sounds MUSCULOSKELETAL:no cyanosis of digits and no clubbing. Range of motion normal.  NEURO: alert & oriented x 3 with fluent speech, no focal motor/sensory deficits   LABORATORY DATA: Hospital Outpatient Visit on 03/05/2013  Component Date Value Range Status  . Glucose-Capillary 03/05/2013 83  70 - 99 mg/dL Final  Office Visit on 02/23/2013  Component Date Value Range Status  . WBC 02/23/2013 7.4  4.0 - 10.5 K/uL Final  . RBC 02/23/2013 3.47* 3.87 - 5.11 MIL/uL Final  . Hemoglobin 02/23/2013 9.3* 12.0 - 15.0 g/dL Final  . HCT 16/12/9602 29.6* 36.0 - 46.0 % Final  . MCV 02/23/2013 85.3  78.0 - 100.0 fL Final  . MCH 02/23/2013 26.8  26.0 - 34.0 pg Final  . MCHC 02/23/2013 31.4  30.0 - 36.0 g/dL Final  . RDW 54/11/8117 13.9  11.5 - 15.5 % Final  . Platelets 02/23/2013 281  150 - 400 K/uL Final  . Neutrophils Relative % 02/23/2013 65  43 - 77 % Final  . Neutro Abs 02/23/2013 4.8  1.7 - 7.7 K/uL Final  . Lymphocytes Relative 02/23/2013 22  12 - 46 % Final  . Lymphs Abs 02/23/2013 1.6  0.7 - 4.0 K/uL Final  . Monocytes Relative 02/23/2013 8  3 - 12 % Final  . Monocytes Absolute 02/23/2013 0.6  0.1 - 1.0 K/uL Final  . Eosinophils Relative 02/23/2013 6* 0 - 5 % Final  . Eosinophils Absolute 02/23/2013 0.4  0.0 - 0.7 K/uL Final  . Basophils Relative  02/23/2013 0  0 - 1 % Final  . Basophils Absolute 02/23/2013 0.0  0.0 - 0.1 K/uL Final  . Sodium 02/23/2013 133* 135 - 145 mEq/L Final  . Potassium 02/23/2013 4.9  3.5 - 5.1 mEq/L Final  . Chloride 02/23/2013 97  96 - 112 mEq/L Final  . CO2 02/23/2013 26  19 - 32 mEq/L Final  . Glucose, Bld 02/23/2013 129* 70 - 99 mg/dL Final  . BUN 14/78/2956 26* 6 - 23 mg/dL Final  . Creatinine, Ser 02/23/2013 1.05  0.50 - 1.10 mg/dL Final  . Calcium 21/30/8657 8.9  8.4 - 10.5 mg/dL Final  . Total Protein 02/23/2013 6.7  6.0 - 8.3 g/dL Final  . Albumin 84/69/6295 3.1* 3.5 - 5.2 g/dL Final  . AST 28/41/3244 16  0 - 37 U/L Final  . ALT 02/23/2013 10  0 - 35 U/L Final  . Alkaline Phosphatase 02/23/2013 264* 39 - 117 U/L Final  . Total Bilirubin 02/23/2013 <0.2* 0.3 - 1.2 mg/dL Final  . GFR calc non Af Amer 02/23/2013 56* >90 mL/min Final  . GFR calc Af Amer 02/23/2013 65* >90 mL/min Final   Comment: (NOTE)                          The eGFR has been calculated using the CKD EPI equation.  This calculation has not been validated in all clinical situations.                          eGFR's persistently <90 mL/min signify possible Chronic Kidney                          Disease.  . CEA 02/23/2013 19.8* 0.0 - 5.0 ng/mL Final   Performed at [facility]  . Ferritin 02/23/2013 15  10 - 291 ng/mL Final   Performed at [facility]  Admission on 02/18/2013, Discharged on 02/18/2013  Component Date Value Range Status  . Glucose-Capillary 02/18/2013 64* 70 - 99 mg/dL Final  . Glucose-Capillary 02/18/2013 102* 70 - 99 mg/dL Final  Office Visit on 02/08/2013  Component Date Value Range Status  . Creat 02/08/2013 1.11* 0.50 - 1.10 mg/dL Final  . GFR, Est African American 02/08/2013 62   Final  . GFR, Est Non African American 02/08/2013 54*  Final   Comment:                            The estimated GFR is a calculation valid for adults (>=80 years old)                           that uses the CKD-EPI algorithm to adjust for age and sex. It is                            not to be used for children, pregnant women, hospitalized patients,                             patients on dialysis, or with rapidly changing kidney function.                          According to the NKDEP, eGFR >89 is normal, 60-89 shows mild                          impairment, 30-59 shows moderate impairment, 15-29 shows severe                          impairment and <15 is ESRD.                             . CEA 02/08/2013 21.0* 0.0 - 5.0 ng/mL Final    PATHOLOGY: Adenocarcinoma.  Urinalysis No results found for this basename: colorurine,  appearanceur,  labspec,  phurine,  glucoseu,  hgbur,  bilirubinur,  ketonesur,  proteinur,  urobilinogen,  nitrite,  leukocytesur    RADIOGRAPHIC STUDIES: Ct Abdomen Pelvis W Contrast  02/09/2013   CLINICAL DATA:  History of rectal carcinoma  EXAM: CT ABDOMEN AND PELVIS WITH CONTRAST  TECHNIQUE: Multidetector CT imaging of the abdomen and pelvis was performed using the standard protocol following bolus administration of intravenous contrast.  CONTRAST:  OMNIPAQUE IOHEXOL 300 MG/ML  SOLN  COMPARISON:  Multiple exams dating back to 11/16/2005.  FINDINGS: Lung bases are free of acute infiltrate or sizable effusion.  The liver and gallbladder as well as the spleen are  within normal limits. The adrenal glands demonstrate a small 1 cm hypodensity within the right adrenal gland likely representing a small adenoma. It is stable from multiple previous exams. Pancreas is well visualized and interdigitated with fat. The left kidney demonstrates significant scarring but normal excretion of contrast material. The right kidney shows a prominent extrarenal pelvis without obstructive change.  Large redundant sigmoid colon is noted. Diverticular change is seen without diverticulitis. The appendix is within normal limits. There is concentric wall thickening identified within  the rectum consistent with the patient's given clinical history. No significant pelvic or retroperitoneal lymphadenopathy is noted.  Multiple stable partially calcified mass lesions are noted superior and to the right of the urinary bladder likely representing calcified uterine fibroids. By history the patient has had a hysterectomy. These changes are unchanged from the previous exams dating back to 2007.  Diffuse chronic increased sclerosis in the pelvic bones and spine is noted. This may be related to some underlying Paget's disease. This is stable over multiple previous exams.  IMPRESSION: Changes consistent with the known history of rectal carcinoma.  Multiple stable calcified pelvic masses likely related to uterine fibroids. There is a given history of hysterectomy however these changes are stable since 2007.  Chronic changes in the left kidney.  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   Electronically Signed   By: Alcide Clever M.D.   On: 02/09/2013 16:06   Nm Pet Image Initial (pi) Skull Base To Thigh  03/05/2013   CLINICAL DATA:  Initial Treatment strategy for rectal cancer.  EXAM: NUCLEAR MEDICINE PET SKULL BASE TO THIGH  FASTING BLOOD GLUCOSE:  Value: 83mg /dl  TECHNIQUE: 16.1 mCi W-96 FDG was injected intravenously. CT data was obtained and used for attenuation correction and anatomic localization only. (This was not acquired as a diagnostic CT examination.) Additional exam technical data entered on technologist worksheet.  COMPARISON:  CT scan 01/2024 2014.  FINDINGS: NECK  No hypermetabolic lymph nodes in the neck.  CHEST  No hypermetabolic mediastinal or hilar nodes. There is a single 5 mm superior segment left lower lobe pulmonary nodule but no other pulmonary lesions and no areas of abnormal FDG uptake. Attention on future scans is recommended.  ABDOMEN/PELVIS  Fairly extensive area of marked FDG uptake in the entire rectum and rectosigmoid junction and possibly lower sigmoid colon  consistent with known carcinoma. SUV max is 26.8. No perirectal, retroperitoneal or mesenteric FDG positive lymph nodes or adenopathy. No findings for metastatic disease involving the liver.  SKELETON  No focal hypermetabolic activity to suggest skeletal metastasis.  IMPRESSION: 1. Fairly extensive rectal tumor likely also involving the rectosigmoid junction and lower sigmoid colon. SUV max is 26.8. 2. No abdominal or pelvic lymphadenopathy. 3. No findings for metastatic disease involving the liver. 4. Single 5 mm left lower lobe pulmonary nodule without definite FDG uptake. Attention on future scans is recommended.   Electronically Signed   By: Loralie Champagne M.D.   On: 03/05/2013 12:11    ASSESSMENT:  #1. Locally advanced rectal cancer by PET scan and endoscopic ultrasound. #2. Iron deficiency, on iron supplements. #3. Diabetes mellitus, type II, insulin requiring. #4. Asthma, on treatment. #5. Hypertension, on treatment. #6. Mental retardation, does have a Child psychotherapist power of attorney (Melissa ) #7. Seizure disorder, controlled. #8. History of Helicobacter pylori gastritis, successfully treated. #9. Allergic rhinitis, controlled.   PLAN:  #1. In the presence of the administrator of her care facility, it was explained that she had  locally advanced disease that would be treated with combined modality treatment utilizing external beam radiation plus oral chemotherapy with Xeloda. She or he has a medication at the site of her home. #2. Radiation therapy consultation on 03/16/2013. #3. Followup in 6 weeks. Information was given regarding the medication and to call should diarrhea occur or the hand foot syndrome. She is not to take the next dose of the lowish should that happen. Xeloda should only be given on the days that radiation therapy is administered. It was suggested that the drug be given 1500 mg twice a day with breakfast and dinner. The caretaker who accompanied her today expressed an  understanding of this strategy.   All questions were answered. The patient knows to call the clinic with any problems, questions or concerns. We can certainly see the patient much sooner if necessary.   I spent 25 minutes counseling the patient face to face. The total time spent in the appointment was 30 minutes.    Maurilio Lovely, MD 03/09/2013 3:35 PM

## 2013-03-09 NOTE — Patient Instructions (Signed)
[facility] Discharge Instructions  RECOMMENDATIONS MADE BY THE CONSULTANT AND ANY TEST RESULTS WILL BE SENT TO YOUR REFERRING PHYSICIAN.  EXAM FINDINGS BY THE PHYSICIAN TODAY AND SIGNS OR SYMPTOMS TO REPORT TO CLINIC OR PRIMARY PHYSICIAN: Exam and findings as discussed by Dr. Zigmund Daniel.  Instructions as given by Rosana Berger, RN.   INSTRUCTIONS/FOLLOW-UP: Follow-up with blood work 3 and 6 weeks after starting radiation & MD visit 6 weeks after starting radiation.  Thank you for choosing [facility] to provide your oncology and hematology care.  To afford each patient quality time with our providers, please arrive at least 15 minutes before your scheduled appointment time.  With your help, our goal is to use those 15 minutes to complete the necessary work-up to ensure our physicians have the information they need to help with your evaluation and healthcare recommendations.    Effective January 1st, 2014, we ask that you re-schedule your appointment with our physicians should you arrive 10 or more minutes late for your appointment.  We strive to give you quality time with our providers, and arriving late affects you and other patients whose appointments are after yours.    Again, thank you for choosing [facility].  Our hope is that these requests will decrease the amount of time that you wait before being seen by our physicians.       _____________________________________________________________  Should you have questions after your visit to [facility] please contact our office at [phone] between the hours of 8:30 a.m. and 5:00 p.m.  Voicemails left after 4:30 p.m. will not be returned until the following business day.  For prescription refill requests, have your pharmacy contact our office with your prescription refill request.

## 2013-03-09 NOTE — Progress Notes (Signed)
Chemo teaching done for Xeloda with [facility]. Consent to be signed by Melissa Price who is the POA for patient. Medication already at facility. Patient to see Radiation Oncology on 03/16/13. Faxing consent form to Melissa.  

## 2013-03-09 NOTE — Progress Notes (Signed)
Chemo teaching done for Xeloda with [facility]. Consent to be signed by Barnie Alderman who is the [facility] for patient. Medication already at facility. Patient to see Radiation Oncology on 03/16/13. Faxing consent form to Melissa.

## 2013-03-18 HISTORY — PX: COLON SURGERY: SHX602

## 2013-04-06 ENCOUNTER — Other Ambulatory Visit (HOSPITAL_COMMUNITY): Payer: PRIVATE HEALTH INSURANCE

## 2013-04-08 ENCOUNTER — Encounter (HOSPITAL_COMMUNITY): Payer: PRIVATE HEALTH INSURANCE | Attending: Hematology and Oncology

## 2013-04-08 DIAGNOSIS — D509 Iron deficiency anemia, unspecified: Secondary | ICD-10-CM

## 2013-04-08 DIAGNOSIS — C20 Malignant neoplasm of rectum: Secondary | ICD-10-CM

## 2013-04-08 DIAGNOSIS — D649 Anemia, unspecified: Secondary | ICD-10-CM | POA: Insufficient documentation

## 2013-04-08 LAB — CBC WITH DIFFERENTIAL/PLATELET
BASOS PCT: 0 % (ref 0–1)
Basophils Absolute: 0 10*3/uL (ref 0.0–0.1)
EOS ABS: 0.3 10*3/uL (ref 0.0–0.7)
Eosinophils Relative: 7 % — ABNORMAL HIGH (ref 0–5)
HEMATOCRIT: 30.2 % — AB (ref 36.0–46.0)
Hemoglobin: 9.7 g/dL — ABNORMAL LOW (ref 12.0–15.0)
Lymphocytes Relative: 12 % (ref 12–46)
Lymphs Abs: 0.6 10*3/uL — ABNORMAL LOW (ref 0.7–4.0)
MCH: 27 pg (ref 26.0–34.0)
MCHC: 32.1 g/dL (ref 30.0–36.0)
MCV: 84.1 fL (ref 78.0–100.0)
MONO ABS: 0.5 10*3/uL (ref 0.1–1.0)
Monocytes Relative: 10 % (ref 3–12)
Neutro Abs: 3.4 10*3/uL (ref 1.7–7.7)
Neutrophils Relative %: 72 % (ref 43–77)
Platelets: 222 10*3/uL (ref 150–400)
RBC: 3.59 MIL/uL — ABNORMAL LOW (ref 3.87–5.11)
RDW: 14 % (ref 11.5–15.5)
WBC: 4.7 10*3/uL (ref 4.0–10.5)

## 2013-04-08 NOTE — Progress Notes (Signed)
Labs drawn today for cbc/diff 

## 2013-04-14 ENCOUNTER — Emergency Department (HOSPITAL_COMMUNITY)
Admission: EM | Admit: 2013-04-14 | Discharge: 2013-04-14 | Disposition: A | Discharge status: Home / Self Care | Payer: PRIVATE HEALTH INSURANCE | Attending: Emergency Medicine | Admitting: Emergency Medicine

## 2013-04-14 ENCOUNTER — Encounter (HOSPITAL_COMMUNITY): Payer: Self-pay | Admitting: Emergency Medicine

## 2013-04-14 ENCOUNTER — Emergency Department (HOSPITAL_COMMUNITY): Payer: PRIVATE HEALTH INSURANCE

## 2013-04-14 DIAGNOSIS — E119 Type 2 diabetes mellitus without complications: Secondary | ICD-10-CM | POA: Insufficient documentation

## 2013-04-14 DIAGNOSIS — I10 Essential (primary) hypertension: Secondary | ICD-10-CM | POA: Insufficient documentation

## 2013-04-14 DIAGNOSIS — T465X5A Adverse effect of other antihypertensive drugs, initial encounter: Secondary | ICD-10-CM | POA: Insufficient documentation

## 2013-04-14 DIAGNOSIS — Z87440 Personal history of urinary (tract) infections: Secondary | ICD-10-CM | POA: Insufficient documentation

## 2013-04-14 DIAGNOSIS — Z9889 Other specified postprocedural states: Secondary | ICD-10-CM | POA: Insufficient documentation

## 2013-04-14 DIAGNOSIS — T783XXA Angioneurotic edema, initial encounter: Secondary | ICD-10-CM | POA: Insufficient documentation

## 2013-04-14 DIAGNOSIS — G40909 Epilepsy, unspecified, not intractable, without status epilepticus: Secondary | ICD-10-CM | POA: Insufficient documentation

## 2013-04-14 DIAGNOSIS — Z794 Long term (current) use of insulin: Secondary | ICD-10-CM | POA: Insufficient documentation

## 2013-04-14 DIAGNOSIS — Z87891 Personal history of nicotine dependence: Secondary | ICD-10-CM | POA: Insufficient documentation

## 2013-04-14 DIAGNOSIS — IMO0002 Reserved for concepts with insufficient information to code with codable children: Secondary | ICD-10-CM | POA: Insufficient documentation

## 2013-04-14 DIAGNOSIS — Z85048 Personal history of other malignant neoplasm of rectum, rectosigmoid junction, and anus: Secondary | ICD-10-CM | POA: Insufficient documentation

## 2013-04-14 DIAGNOSIS — J45901 Unspecified asthma with (acute) exacerbation: Secondary | ICD-10-CM | POA: Insufficient documentation

## 2013-04-14 DIAGNOSIS — Z7982 Long term (current) use of aspirin: Secondary | ICD-10-CM | POA: Insufficient documentation

## 2013-04-14 DIAGNOSIS — F411 Generalized anxiety disorder: Secondary | ICD-10-CM | POA: Insufficient documentation

## 2013-04-14 DIAGNOSIS — T464X5A Adverse effect of angiotensin-converting-enzyme inhibitors, initial encounter: Secondary | ICD-10-CM

## 2013-04-14 HISTORY — DX: Malignant (primary) neoplasm, unspecified: C80.1

## 2013-04-14 MED ORDER — DIPHENHYDRAMINE HCL 12.5 MG/5ML PO SYRP: 12.5000 mg | ORAL_SOLUTION | Freq: Three times a day (TID) | ORAL | Status: DC

## 2013-04-14 MED ORDER — FAMOTIDINE IN NACL 20-0.9 MG/50ML-% IV SOLN
20.0000 mg | Freq: Once | INTRAVENOUS | Status: AC
  Administered 2013-04-14: 20 mg via INTRAVENOUS

## 2013-04-14 MED ORDER — DIPHENHYDRAMINE HCL 50 MG/ML IJ SOLN
25.0000 mg | Freq: Once | INTRAMUSCULAR | Status: AC
  Administered 2013-04-14: 25 mg via INTRAVENOUS
  Filled 2013-04-14: qty 1

## 2013-04-14 MED ORDER — FAMOTIDINE IN NACL 20-0.9 MG/50ML-% IV SOLN
40.0000 mg | Freq: Once | INTRAVENOUS | Status: DC
  Filled 2013-04-14: qty 100

## 2013-04-14 NOTE — Discharge Instructions (Signed)
The swelling of your lips, and tongue is probably related to the medication lisinopril that your are taking. Please stop this medication. Please see your MD for replacement medication. Please do not use any medications in the ACE inhibitor family. Please use benadryl three times daily today and tomorrow 1/29. Please return if any increase in swelling, shortness of breath, or difficulty swallowing.

## 2013-04-14 NOTE — ED Provider Notes (Signed)
CSN: [id number]     Arrival date & time 04/14/13  3790 History   First MD Initiated Contact with Patient 04/14/13 [phone]     Chief Complaint  Patient presents with  . Facial Swelling   (Consider location/radiation/quality/duration/timing/severity/associated sxs/prior Treatment) HPI Comments: Pt is a 63 y/o female who presents to the ED with c/o right side facial swelling. Pt states she had teeth pulled recently. She noted today that the right upper and lower lip were swollen.No c/o pain. No reported fever.  Pt denies any problem with swallowing or speaking. She denies chest tightness.  It is of note that the swelling started after pt received lisinopril. It is also of noted that pt had teeth pulled between the right and left lower canines, and only swelling noted on the right.Assisted living facility sent pt to ED for evaluation.  The history is provided by the patient.    Past Medical History  Diagnosis Date  . Hypertension   . Diabetes mellitus     years  . Seizures   . Asthma   . Depression   . Mental retardation   . History of recurrent UTIs   . Shortness of breath     with exertion  . Cancer     Rectal    Past Surgical History  Procedure Laterality Date  . Abdominal hysterectomy    . Multiple extractions with alveoloplasty N/A 11/23/2012    Procedure: MULTIPLE EXTRACION 1, 2, 4, 5, 6, 7, 8, 9, 10, 11, 12, 13, 14, 17, 18, 20, 23, 24, 25, 26, 28, 29, 32 WITH ALVEOLOPLASTY, REMOVE BILATERAL TORI;  Surgeon: Gae Bon, DDS;  Location: [facility];  Service: Oral Surgery;  Laterality: N/A;  . Colonoscopy with esophagogastroduodenoscopy (egd) N/A 01/14/2013    Procedure: COLONOSCOPY WITH ESOPHAGOGASTRODUODENOSCOPY (EGD);  Surgeon: Rogene Houston, MD;  [facility];  Service: Endoscopy;  Laterality: N/A;  250-moved to 315 Ann to notify pt  . Colonoscopy N/A 02/04/2013    Procedure: COLONOSCOPY;  Surgeon: Rogene Houston, MD;  [facility];  Service: Endoscopy;   Laterality: N/A;  225  . Eus N/A 02/18/2013    Procedure: LOWER ENDOSCOPIC ULTRASOUND (EUS);  Surgeon: Milus Banister, MD;  Location: [facility];  Service: Endoscopy;  Laterality: N/A;   No family history on file. History  Substance Use Topics  . Smoking status: Former Smoker    Types: Cigarettes  . Smokeless tobacco: Never Used     Comment: unknown when stopped smoking      long time  . Alcohol Use: No   OB History   Grav Para Term Preterm Abortions TAB SAB Ect Mult Living                 Review of Systems  Constitutional: Negative for activity change.       All ROS Neg except as noted in HPI  HENT: Negative for nosebleeds.   Eyes: Negative for photophobia and discharge.  Respiratory: Positive for shortness of breath. Negative for cough and wheezing.   Cardiovascular: Negative for chest pain and palpitations.  Gastrointestinal: Negative for abdominal pain and blood in stool.  Genitourinary: Negative for dysuria, frequency and hematuria.  Musculoskeletal: Negative for arthralgias, back pain and neck pain.  Skin: Negative.   Neurological: Positive for seizures. Negative for dizziness and speech difficulty.  Psychiatric/Behavioral: Negative for hallucinations and confusion. The patient is nervous/anxious.     Allergies  Review of patient's allergies indicates no known allergies.  Home Medications   Current Outpatient Rx  Name  Route  Sig  Dispense  Refill  . aspirin EC 81 MG tablet   Oral   Take 81 mg by mouth daily.         . ferrous sulfate 325 (65 FE) MG tablet   Oral   Take 325 mg by mouth 2 (two) times daily.         . fish oil-omega-3 fatty acids 1000 MG capsule   Oral   Take 1 g by mouth 2 (two) times daily.         . Fluticasone-Salmeterol (ADVAIR) 250-50 MCG/DOSE AEPB   Inhalation   Inhale 1 puff into the lungs every 12 (twelve) hours.         . Insulin Glargine (LANTUS SOLOSTAR) 100 UNIT/ML SOPN   Subcutaneous   Inject 20 Units into the skin  at bedtime.          [name] lisinopril (PRINIVIL,ZESTRIL) 40 MG tablet   Oral   Take 40 mg by mouth daily.         [name] loratadine-pseudoephedrine (LORATADINE-D 24HR) 10-240 MG per 24 hr tablet   Oral   Take 1 tablet by mouth daily.         . metFORMIN (GLUCOPHAGE) 1000 MG tablet   Oral   Take 1,000 mg by mouth 2 (two) times daily with a meal.         . metoprolol succinate (TOPROL-XL) 50 MG 24 hr tablet   Oral   Take 100 mg by mouth at bedtime. Take with or immediately following a meal.         . montelukast (SINGULAIR) 10 MG tablet   Oral   Take 10 mg by mouth at bedtime.         . pantoprazole (PROTONIX) 40 MG tablet   Oral   Take 40 mg by mouth daily.         . phenytoin (DILANTIN) 100 MG ER capsule   Oral   Take 100 mg by mouth 2 (two) times daily.         . pravastatin (PRAVACHOL) 40 MG tablet   Oral   Take 40 mg by mouth daily.         [name] triamcinolone cream (KENALOG) 0.1 %   Topical   Apply 1 application topically daily. To legs          BP 145/58  Pulse 91  Temp(Src) 98.1 F (36.7 C) (Oral)  Resp 18  Ht 5\' 3"  (1.6 m)  Wt 224 lb (101.606 kg)  BMI 39.69 kg/m2  SpO2 100% Physical Exam  Nursing note and vitals reviewed. Constitutional: She is oriented to person, place, and time. She appears well-developed and well-nourished.  Non-toxic appearance.  HENT:  Head: Normocephalic.  Right Ear: Tympanic membrane and external ear normal.  Left Ear: Tympanic membrane and external ear normal.  S/p tooth extractions between the right and left canines. The right   Upper and lower lip swollen. NOn-tender.Mild swelling under the tongue. Airway patent. Speech is clear.  Eyes: EOM and lids are normal. Pupils are equal, round, and reactive to light.  Neck: Normal range of motion. Neck supple. Carotid bruit is not present.  Cardiovascular: Normal rate, regular rhythm, normal heart sounds, intact distal pulses and normal pulses.   Pulmonary/Chest: Breath  sounds normal. No respiratory distress. She has no wheezes. She has no rales. She exhibits no tenderness.  Abdominal: Soft. Bowel sounds are normal. There  is no tenderness. There is no guarding.  Musculoskeletal: Normal range of motion.  Lymphadenopathy:       Head (right side): No submandibular adenopathy present.       Head (left side): No submandibular adenopathy present.    She has no cervical adenopathy.  Neurological: She is alert and oriented to person, place, and time. She has normal strength. No cranial nerve deficit or sensory deficit.  Skin: Skin is warm and dry. No rash noted.  Psychiatric: She has a normal mood and affect. Her speech is normal.    ED Course  Procedures (including critical care time) Labs Review Labs Reviewed - No data to display Imaging Review No results found.  EKG Interpretation   None       MDM  No diagnosis found. **I have reviewed nursing notes, vital signs, and all appropriate lab and imaging results for this patient.* 11:32 No change. Pt awake, No change in swelling. Pulse ox 96% RA. 12:10p Pt speaking in complete sentences. She appears comfortable. No increase in the swelling of the lips or tongue. 1;00P SWELLING under the tongue improving. Airwy patent. Speech clear. Pt resting nicely. 2:05 No hives. No lung changes. Pt drinking without problem. PUlse ox 98%. No change in swelling of the right upper and lower lip.  After observation in ED, feel it is OK to discharge patient. I spoke with the administrator at the assisted living facility. She will hold the lisinopril until pt is examined by her MD. They will return to the ED if any changes or problem.  Lenox Ahr, PA-C 04/15/13 2305

## 2013-04-14 NOTE — ED Notes (Signed)
Pt states she had multiple teeth pulled yesterday, c/o right side facial swelling today. Denies pain. No airway obstruction.

## 2013-04-20 NOTE — ED Provider Notes (Signed)
Medical screening examination/treatment/procedure(s) were performed by non-physician practitioner and as supervising physician I was immediately available for consultation/collaboration.  EKG Interpretation   None         Mervin Kung, MD 04/20/13 1116

## 2013-04-27 ENCOUNTER — Encounter (HOSPITAL_COMMUNITY): Payer: Self-pay

## 2013-04-27 ENCOUNTER — Encounter (HOSPITAL_COMMUNITY): Payer: PRIVATE HEALTH INSURANCE | Attending: Hematology and Oncology

## 2013-04-27 ENCOUNTER — Encounter (HOSPITAL_COMMUNITY): Payer: PRIVATE HEALTH INSURANCE

## 2013-04-27 VITALS — BP 152/67 | HR 91 | Temp 97.9°F | Resp 16 | Wt 224.5 lb

## 2013-04-27 DIAGNOSIS — IMO0001 Reserved for inherently not codable concepts without codable children: Secondary | ICD-10-CM

## 2013-04-27 DIAGNOSIS — D649 Anemia, unspecified: Secondary | ICD-10-CM

## 2013-04-27 DIAGNOSIS — E1165 Type 2 diabetes mellitus with hyperglycemia: Secondary | ICD-10-CM

## 2013-04-27 DIAGNOSIS — C20 Malignant neoplasm of rectum: Secondary | ICD-10-CM

## 2013-04-27 DIAGNOSIS — D509 Iron deficiency anemia, unspecified: Secondary | ICD-10-CM

## 2013-04-27 DIAGNOSIS — F79 Unspecified intellectual disabilities: Secondary | ICD-10-CM

## 2013-04-27 LAB — CBC WITH DIFFERENTIAL/PLATELET
BASOS ABS: 0 10*3/uL (ref 0.0–0.1)
BASOS PCT: 0 % (ref 0–1)
Eosinophils Absolute: 0.4 10*3/uL (ref 0.0–0.7)
Eosinophils Relative: 9 % — ABNORMAL HIGH (ref 0–5)
HCT: 28.7 % — ABNORMAL LOW (ref 36.0–46.0)
Hemoglobin: 9.5 g/dL — ABNORMAL LOW (ref 12.0–15.0)
Lymphocytes Relative: 8 % — ABNORMAL LOW (ref 12–46)
Lymphs Abs: 0.3 10*3/uL — ABNORMAL LOW (ref 0.7–4.0)
MCH: 28 pg (ref 26.0–34.0)
MCHC: 33.1 g/dL (ref 30.0–36.0)
MCV: 84.7 fL (ref 78.0–100.0)
MONOS PCT: 10 % (ref 3–12)
Monocytes Absolute: 0.4 10*3/uL (ref 0.1–1.0)
NEUTROS ABS: 3.1 10*3/uL (ref 1.7–7.7)
Neutrophils Relative %: 73 % (ref 43–77)
Platelets: 213 10*3/uL (ref 150–400)
RBC: 3.39 MIL/uL — ABNORMAL LOW (ref 3.87–5.11)
RDW: 15.4 % (ref 11.5–15.5)
WBC: 4.2 10*3/uL (ref 4.0–10.5)

## 2013-04-27 NOTE — Patient Instructions (Signed)
[facility] Discharge Instructions  RECOMMENDATIONS MADE BY THE CONSULTANT AND ANY TEST RESULTS WILL BE SENT TO YOUR REFERRING PHYSICIAN.  EXAM FINDINGS BY THE PHYSICIAN TODAY AND SIGNS OR SYMPTOMS TO REPORT TO CLINIC OR PRIMARY PHYSICIAN: Exam and findings as discussed by Dr. Barnet Glasgow.  To continue Capecitabine (xeloda) while receiving Radiation therapy.  Should be getting a total of 28 radiation treatments and as long as no appointments are miss your last day for radiation is 05/04/12.  Once radiation is completed the capecitabine can be discontinued.  Will check some blood work today.  Report fevers, chills, uncontrolled nausea, vomiting or diarrhea.  MEDICATIONS PRESCRIBED:  Continue capecitabine while receiving Radiation Therapy  INSTRUCTIONS/FOLLOW-UP: Follow-up 2 weeks after completion of radiation therapy  - March 3 at 1:30pm  Thank you for choosing [facility] to provide your oncology and hematology care.  To afford each patient quality time with our providers, please arrive at least 15 minutes before your scheduled appointment time.  With your help, our goal is to use those 15 minutes to complete the necessary work-up to ensure our physicians have the information they need to help with your evaluation and healthcare recommendations.    Effective January 1st, 2014, we ask that you re-schedule your appointment with our physicians should you arrive 10 or more minutes late for your appointment.  We strive to give you quality time with our providers, and arriving late affects you and other patients whose appointments are after yours.    Again, thank you for choosing [facility].  Our hope is that these requests will decrease the amount of time that you wait before being seen by our physicians.       _____________________________________________________________  Should you have questions after your visit to [facility], please  contact our office at [phone] between the hours of 8:30 a.m. and 5:00 p.m.  Voicemails left after 4:30 p.m. will not be returned until the following business day.  For prescription refill requests, have your pharmacy contact our office with your prescription refill request.

## 2013-04-27 NOTE — Progress Notes (Signed)
[name] presented for labwork. Labs per MD order drawn via Peripheral Line 23 gauge needle inserted in left AC  Good blood return present. Procedure without incident.  Needle removed intact. Patient tolerated procedure well.

## 2013-04-27 NOTE — Progress Notes (Signed)
[facility]  OFFICE PROGRESS NOTE  [name], MD [place] [address]  DIAGNOSIS: Rectal carcinoma - Plan: CBC with Differential  Anemia, unspecified  Type II or unspecified type diabetes mellitus without mention of complication, uncontrolled  Mental retardation  Anemia, chronic blood loss - Plan: CBC with Differential  Chief Complaint  Patient presents with  . Rectal Cancer    CURRENT THERAPY: Radiation plus Xeloda 5 days per week. Her original consultation with radiation oncology was 03/16/2013  INTERVAL HISTORY: Grey Schlauch 63 y.o. female returns for followup while undergoing daily radiotherapy and taking Xeloda 1500 mg twice a day with food to treat locally advanced rectal cancer neoadjuvantly.  She initiate radiotherapy on 03/25/2013 and is receiving Xeloda 1500 mg twice a day on the days of radiation. She has a total of 28 treatments plan with the last treatment on 05/04/2013. She denies any nausea, vomiting, abdominal discomfort, rectal bleeding, hematuria, cough, wheezing, incontinence, lower extremity swelling or redness, PND, orthopnea, palpitations, sore mouth, sore palms, sore feet, or diarrhea. Appetite has been good with no dysgeusia.  MEDICAL HISTORY: Past Medical History  Diagnosis Date  . Hypertension   . Diabetes mellitus     years  . Seizures   . Asthma   . Depression   . Mental retardation   . History of recurrent UTIs   . Shortness of breath     with exertion  . Cancer     Rectal     INTERIM HISTORY: has DIABETES MELLITUS, TYPE II, UNCONTROLLED; HYPERLIPIDEMIA; GOUT NOS; UNSPECIFIED DEFICIENCY ANEMIA; ANEMIA; DEPRESSION; RETARDATION, MENTAL NOS; HYPERTENSION; SINUS TACHYCARDIA; ALLERGIC RHINITIS; ASTHMA; ASTHMA, WITH ACUTE EXACERBATION; DISEASE, PANCREAS NOS; INTERTRIGO, CANDIDAL; ARTHRITIS; SEIZURE DISORDER; UNSPECIFIED SLEEP DISTURBANCE; FLANK PAIN, LEFT; LIVER  FUNCTION TESTS, ABNORMAL; MIGRAINES, HX OF; Elevated liver enzymes; Rectal carcinoma; Helicobacter pylori gastritis; and Rectal cancer on her problem list.    ALLERGIES:  has No Known Allergies.  MEDICATIONS: has a current medication list which includes the following prescription(s): accu-chek aviva plus, amlodipine, aspirin ec, capecitabine, diphenhydramine, ferrous sulfate, fish oil-omega-3 fatty acids, fluticasone-salmeterol, insulin glargine, loratadine-pseudoephedrine, metformin, metoprolol succinate, montelukast, pantoprazole, phenytoin, pravastatin, and triamcinolone cream.  SURGICAL HISTORY:  Past Surgical History  Procedure Laterality Date  . Abdominal hysterectomy    . Multiple extractions with alveoloplasty N/A 11/23/2012    Procedure: MULTIPLE EXTRACION 1, 2, 4, 5, 6, 7, 8, 9, 10, 11, 12, 13, 14, 17, 18, 20, 23, 24, 25, 26, 28, 29, 32 WITH ALVEOLOPLASTY, REMOVE BILATERAL TORI;  Surgeon: Gae Bon, DDS;  Location: [facility];  Service: Oral Surgery;  Laterality: N/A;  . Colonoscopy with esophagogastroduodenoscopy (egd) N/A 01/14/2013    Procedure: COLONOSCOPY WITH ESOPHAGOGASTRODUODENOSCOPY (EGD);  Surgeon: Rogene Houston, MD;  [facility];  Service: Endoscopy;  Laterality: N/A;  250-moved to 315 Ann to notify pt  . Colonoscopy N/A 02/04/2013    Procedure: COLONOSCOPY;  Surgeon: Rogene Houston, MD;  [facility];  Service: Endoscopy;  Laterality: N/A;  225  . Eus N/A 02/18/2013    Procedure: LOWER ENDOSCOPIC ULTRASOUND (EUS);  Surgeon: Milus Banister, MD;  Location: [facility];  Service: Endoscopy;  Laterality: N/A;    FAMILY HISTORY: family history is not on file.  SOCIAL HISTORY:  reports that she has quit smoking. Her smoking use included Cigarettes. She smoked 0.00 packs per day. She has never used smokeless tobacco. She reports that  she does not drink alcohol or use illicit drugs.  REVIEW OF SYSTEMS:  Other than that discussed above is  noncontributory.  PHYSICAL EXAMINATION: ECOG PERFORMANCE STATUS: 1 - Symptomatic but completely ambulatory  Blood pressure 152/67, pulse 91, temperature 97.9 F (36.6 C), temperature source Oral, resp. rate 16, weight 224 lb 8 oz (101.833 kg), SpO2 100.00%.  GENERAL:alert, no distress and comfortable. Morbidly obese. SKIN: skin color, texture, turgor are normal, no rashes or significant lesions EYES: PERLA; Conjunctiva are pink and non-injected, sclera clear OROPHARYNX:no exudate, no erythema on lips, buccal mucosa, or tongue. NECK: supple, thyroid normal size, non-tender, without nodularity. No masses CHEST: Normal AP diameter with no breast masses. LYMPH:  no palpable lymphadenopathy in the cervical, axillary or inguinal LUNGS: clear to auscultation and percussion with normal breathing effort HEART: regular rate & rhythm and no murmurs. ABDOMEN:abdomen soft, non-tender and normal bowel sounds. Obese and soft with no organomegaly, ascites, or CVA tenderness. MUSCULOSKELETAL:no cyanosis of digits and no clubbing. Range of motion normal. No evidence of hand-foot syndrome.  NEURO: alert & oriented x 3 with fluent speech, no focal motor/sensory deficits   LABORATORY DATA: Infusion on 04/08/2013  Component Date Value Range Status  . WBC 04/08/2013 4.7  4.0 - 10.5 K/uL Final  . RBC 04/08/2013 3.59* 3.87 - 5.11 MIL/uL Final  . Hemoglobin 04/08/2013 9.7* 12.0 - 15.0 g/dL Final  . HCT 04/08/2013 30.2* 36.0 - 46.0 % Final  . MCV 04/08/2013 84.1  78.0 - 100.0 fL Final  . MCH 04/08/2013 27.0  26.0 - 34.0 pg Final  . MCHC 04/08/2013 32.1  30.0 - 36.0 g/dL Final  . RDW 04/08/2013 14.0  11.5 - 15.5 % Final  . Platelets 04/08/2013 222  150 - 400 K/uL Final  . Neutrophils Relative % 04/08/2013 72  43 - 77 % Final  . Neutro Abs 04/08/2013 3.4  1.7 - 7.7 K/uL Final  . Lymphocytes Relative 04/08/2013 12  12 - 46 % Final  . Lymphs Abs 04/08/2013 0.6* 0.7 - 4.0 K/uL Final  . Monocytes Relative  04/08/2013 10  3 - 12 % Final  . Monocytes Absolute 04/08/2013 0.5  0.1 - 1.0 K/uL Final  . Eosinophils Relative 04/08/2013 7* 0 - 5 % Final  . Eosinophils Absolute 04/08/2013 0.3  0.0 - 0.7 K/uL Final  . Basophils Relative 04/08/2013 0  0 - 1 % Final  . Basophils Absolute 04/08/2013 0.0  0.0 - 0.1 K/uL Final    PATHOLOGY: Adenocarcinoma  Urinalysis No results found for this basename: colorurine,  appearanceur,  labspec,  phurine,  glucoseu,  hgbur,  bilirubinur,  ketonesur,  proteinur,  urobilinogen,  nitrite,  leukocytesur    RADIOGRAPHIC STUDIES: Dg Chest Port 1 View  04/14/2013   CLINICAL DATA:  Right-sided facial swelling, history of recent tooth extraction  EXAMg: PORTABLE CHEST - 1 VIEW  COMPARISON:  Prior chest x-ray 09/30/2012; CT abdomen / pelvis 02/09/2013  FINDINGS: Persistent chronic elevation of the right hemidiaphragm. New lucency in the the right hemidiaphragm with a suggestion of colonic markings and stool. This is favored to reflect colonic interposition rather than free air. Stable cardiac and mediastinal contours. Chronic mild bronchitic changes and interstitial prominence without evidence of a new infiltration, pleural effusion, pneumothorax or pulmonary edema. Moderate left shoulder degenerative changes. No acute osseous abnormality.  IMPRESSION: 1. No acute cardiopulmonary process. 2. Lucency under the chronically elevated right hemidiaphragm is favored to reflect interval colonic interposition rather than free air.   Electronically Signed  By: Jacqulynn Cadet M.D.   On: 04/14/2013 11:14    ASSESSMENT:  #1. Locally advanced adenocarcinoma of the rectum, tolerating combined modality therapy well. Radiation therapy should be completed on 05/04/2013 which will also be last day she receives Xeloda. #2. Iron deficiency, on iron supplements, #3. Diabetes mellitus, type II, insulin requiring, controlled. #4. Hypertension, on treatment. #5. Asthma, on treatment. #6. Mental  retardation, social worker power of attorney is Melissa. #7. Past history of Helicobacter pylori gastritis, successfully treated. #8. Allergic rhinitis, not symptomatic.    PLAN:  #1. Call if develops hand-foot syndrome or diarrhea. If so, Xeloda should be stopped and not restaredt until the symptomatology reverses. I #2. Followup on 05/18/2013 with plans to repeat sigmoidoscopy prior to performing definitive surgery. Surgical rigid sigmoidoscopy may be the most valuable diagnostic intervention so that decision can be made regarding whether or not a low anterior resection can be done.    All questions were answered. The patient knows to call the clinic with any problems, questions or concerns. We can certainly see the patient much sooner if necessary.   I spent 25 minutes counseling the patient face to face. The total time spent in the appointment was 30 minutes.    Doroteo Bradford, MD 04/27/2013 1:37 PM

## 2013-04-28 ENCOUNTER — Other Ambulatory Visit (HOSPITAL_COMMUNITY): Payer: Self-pay

## 2013-04-28 DIAGNOSIS — C20 Malignant neoplasm of rectum: Secondary | ICD-10-CM

## 2013-05-06 ENCOUNTER — Other Ambulatory Visit (INDEPENDENT_AMBULATORY_CARE_PROVIDER_SITE_OTHER): Payer: Self-pay | Admitting: Internal Medicine

## 2013-05-18 ENCOUNTER — Encounter (HOSPITAL_COMMUNITY): Payer: PRIVATE HEALTH INSURANCE | Attending: Hematology and Oncology

## 2013-05-18 ENCOUNTER — Encounter (HOSPITAL_COMMUNITY): Payer: Self-pay

## 2013-05-18 ENCOUNTER — Encounter (HOSPITAL_COMMUNITY): Payer: PRIVATE HEALTH INSURANCE

## 2013-05-18 VITALS — BP 153/80 | HR 90 | Temp 99.0°F | Resp 19 | Wt 221.8 lb

## 2013-05-18 DIAGNOSIS — I10 Essential (primary) hypertension: Secondary | ICD-10-CM

## 2013-05-18 DIAGNOSIS — IMO0001 Reserved for inherently not codable concepts without codable children: Secondary | ICD-10-CM

## 2013-05-18 DIAGNOSIS — F79 Unspecified intellectual disabilities: Secondary | ICD-10-CM

## 2013-05-18 DIAGNOSIS — C20 Malignant neoplasm of rectum: Secondary | ICD-10-CM | POA: Insufficient documentation

## 2013-05-18 DIAGNOSIS — D649 Anemia, unspecified: Secondary | ICD-10-CM | POA: Insufficient documentation

## 2013-05-18 DIAGNOSIS — G40909 Epilepsy, unspecified, not intractable, without status epilepticus: Secondary | ICD-10-CM

## 2013-05-18 DIAGNOSIS — D509 Iron deficiency anemia, unspecified: Secondary | ICD-10-CM

## 2013-05-18 DIAGNOSIS — E1165 Type 2 diabetes mellitus with hyperglycemia: Secondary | ICD-10-CM

## 2013-05-18 LAB — CBC WITH DIFFERENTIAL/PLATELET
BASOS ABS: 0 10*3/uL (ref 0.0–0.1)
Basophils Relative: 0 % (ref 0–1)
EOS PCT: 4 % (ref 0–5)
Eosinophils Absolute: 0.3 10*3/uL (ref 0.0–0.7)
HCT: 29.3 % — ABNORMAL LOW (ref 36.0–46.0)
Hemoglobin: 9.5 g/dL — ABNORMAL LOW (ref 12.0–15.0)
Lymphocytes Relative: 7 % — ABNORMAL LOW (ref 12–46)
Lymphs Abs: 0.5 10*3/uL — ABNORMAL LOW (ref 0.7–4.0)
MCH: 28.5 pg (ref 26.0–34.0)
MCHC: 32.4 g/dL (ref 30.0–36.0)
MCV: 88 fL (ref 78.0–100.0)
Monocytes Absolute: 0.7 10*3/uL (ref 0.1–1.0)
Monocytes Relative: 9 % (ref 3–12)
Neutro Abs: 5.9 10*3/uL (ref 1.7–7.7)
Neutrophils Relative %: 80 % — ABNORMAL HIGH (ref 43–77)
Platelets: 209 10*3/uL (ref 150–400)
RBC: 3.33 MIL/uL — ABNORMAL LOW (ref 3.87–5.11)
RDW: 17.1 % — AB (ref 11.5–15.5)
WBC: 7.3 10*3/uL (ref 4.0–10.5)

## 2013-05-18 LAB — COMPREHENSIVE METABOLIC PANEL
ALBUMIN: 3.3 g/dL — AB (ref 3.5–5.2)
ALK PHOS: 151 U/L — AB (ref 39–117)
ALT: 10 U/L (ref 0–35)
AST: 16 U/L (ref 0–37)
BUN: 31 mg/dL — ABNORMAL HIGH (ref 6–23)
CHLORIDE: 102 meq/L (ref 96–112)
CO2: 23 mEq/L (ref 19–32)
Calcium: 7.8 mg/dL — ABNORMAL LOW (ref 8.4–10.5)
Creatinine, Ser: 1.19 mg/dL — ABNORMAL HIGH (ref 0.50–1.10)
GFR calc Af Amer: 56 mL/min — ABNORMAL LOW (ref >90)
GFR calc non Af Amer: 48 mL/min — ABNORMAL LOW (ref >90)
Glucose, Bld: 193 mg/dL — ABNORMAL HIGH (ref 70–99)
Potassium: 5.2 mEq/L (ref 3.7–5.3)
Sodium: 137 mEq/L (ref 137–147)
Total Protein: 7.2 g/dL (ref 6.0–8.3)

## 2013-05-18 NOTE — Progress Notes (Signed)
[facility]  OFFICE PROGRESS NOTE  [name], MD [place] [address]  DIAGNOSIS: Rectal carcinoma - Plan: CBC with Differential  Anemia, unspecified  Type II or unspecified type diabetes mellitus without mention of complication, uncontrolled  Unspecified essential hypertension  Seizure disorder  Mental retardation  Anemia, chronic blood loss - Plan: CBC with Differential  Chief Complaint  Patient presents with  . Rectal Cancer    Combined modality therapy with Xeloda/RT    CURRENT THERAPY: Xeloda plus external beam radiotherapy  INTERVAL HISTORY: [name] 63 y.o. female returns for followup while receiving combined modality therapy for carcinoma of the rectum, locally advanced. She to Xeloda 1500 mg twice a day for 5 days a week during radiation treatment which was started on 03/25/2013 and ended on 05/04/2013. She denies any further rectal bleeding, rectal or perineal irritation, dysuria, hematuria, incontinence, worsening lower extremity swelling or redness, cough, wheezing, PND, orthopnea, palpitations, abdominal distention, melena, vaginal bleeding, fever, or night sweats. She also did not experience any episodes of hand-foot syndrome or uncontrollable diarrhea.  MEDICAL HISTORY: Past Medical History  Diagnosis Date  . Hypertension   . Diabetes mellitus     years  . Seizures   . Asthma   . Depression   . Mental retardation   . History of recurrent UTIs   . Shortness of breath     with exertion  . Cancer     Rectal     INTERIM HISTORY: has DIABETES MELLITUS, TYPE II, UNCONTROLLED; HYPERLIPIDEMIA; GOUT NOS; UNSPECIFIED DEFICIENCY ANEMIA; ANEMIA; DEPRESSION; RETARDATION, MENTAL NOS; HYPERTENSION; SINUS TACHYCARDIA; ALLERGIC RHINITIS; ASTHMA; ASTHMA, WITH ACUTE EXACERBATION; DISEASE, PANCREAS NOS; INTERTRIGO, CANDIDAL; ARTHRITIS; SEIZURE DISORDER; UNSPECIFIED SLEEP DISTURBANCE;  FLANK PAIN, LEFT; LIVER FUNCTION TESTS, ABNORMAL; MIGRAINES, HX OF; Elevated liver enzymes; Rectal carcinoma; Helicobacter pylori gastritis; and Rectal cancer on her problem list.    ALLERGIES:  has No Known Allergies.  MEDICATIONS: has a current medication list which includes the following prescription(s): accu-chek aviva plus, amlodipine, aspirin ec, diphenhydramine, ferrous sulfate, fish oil-omega-3 fatty acids, fluticasone-salmeterol, insulin glargine, loratadine-pseudoephedrine, metformin, metoprolol succinate, montelukast, pantoprazole, phenytoin, pravastatin, triamcinolone cream, and capecitabine.  SURGICAL HISTORY:  Past Surgical History  Procedure Laterality Date  . Abdominal hysterectomy    . Multiple extractions with alveoloplasty N/A 11/23/2012    Procedure: MULTIPLE EXTRACION 1, 2, 4, 5, 6, 7, 8, 9, 10, 11, 12, 13, 14, 17, 18, 20, 23, 24, 25, 26, 28, 29, 32 WITH ALVEOLOPLASTY, REMOVE BILATERAL TORI;  Surgeon: Gae Bon, DDS;  Location: [facility];  Service: Oral Surgery;  Laterality: N/A;  . Colonoscopy with esophagogastroduodenoscopy (egd) N/A 01/14/2013    Procedure: COLONOSCOPY WITH ESOPHAGOGASTRODUODENOSCOPY (EGD);  Surgeon: Rogene [place], MD;  [facility];  Service: Endoscopy;  Laterality: N/A;  250-moved to 315 Ann to notify pt  . Colonoscopy N/A 02/04/2013    Procedure: COLONOSCOPY;  Surgeon: Rogene [place], MD;  [facility];  Service: Endoscopy;  Laterality: N/A;  225  . Eus N/A 02/18/2013    Procedure: LOWER ENDOSCOPIC ULTRASOUND (EUS);  Surgeon: Milus Banister, MD;  Location: [facility];  Service: Endoscopy;  Laterality: N/A;    FAMILY HISTORY: family history is not on file.  SOCIAL HISTORY:  reports that she has quit smoking. Her smoking use included Cigarettes. She smoked 0.00 packs per day. She has never used smokeless tobacco. She reports that she does not  drink alcohol or use illicit drugs.  REVIEW OF SYSTEMS:  Other than that discussed  above is noncontributory.  PHYSICAL EXAMINATION: ECOG PERFORMANCE STATUS: 1 - Symptomatic but completely ambulatory  Blood pressure 153/80, pulse 90, temperature 99 F (37.2 C), temperature source Oral, resp. rate 19, weight 221 lb 12.8 oz (100.608 kg).  GENERAL:alert, no distress and comfortable. Morbidly obese. SKIN: skin color, texture, turgor are normal, no rashes or significant lesions EYES: PERLA; Conjunctiva are pink and non-injected, sclera clear OROPHARYNX:no exudate, no erythema on lips, buccal mucosa, or tongue. NECK: supple, thyroid normal size, non-tender, without nodularity. No masses CHEST: Normal AP diameter with no breast masses. LYMPH:  no palpable lymphadenopathy in the cervical, axillary or inguinal LUNGS: clear to auscultation and percussion with normal breathing effort HEART: regular rate & rhythm and no murmurs. ABDOMEN:abdomen soft, non-tender and normal bowel sounds. No hepatomegaly, ascites, or CVA tenderness. MUSCULOSKELETAL:no cyanosis of digits and no clubbing. Range of motion normal.  NEURO: alert & oriented x 3 with fluent speech, no focal motor/sensory deficits   LABORATORY DATA: Appointment on 04/27/2013  Component Date Value Ref Range Status  . WBC 04/27/2013 4.2  4.0 - 10.5 K/uL Final  . RBC 04/27/2013 3.39* 3.87 - 5.11 MIL/uL Final  . Hemoglobin 04/27/2013 9.5* 12.0 - 15.0 g/dL Final  . HCT 04/27/2013 28.7* 36.0 - 46.0 % Final  . MCV 04/27/2013 84.7  78.0 - 100.0 fL Final  . MCH 04/27/2013 28.0  26.0 - 34.0 pg Final  . MCHC 04/27/2013 33.1  30.0 - 36.0 g/dL Final  . RDW 04/27/2013 15.4  11.5 - 15.5 % Final  . Platelets 04/27/2013 213  150 - 400 K/uL Final  . Neutrophils Relative % 04/27/2013 73  43 - 77 % Final  . Neutro Abs 04/27/2013 3.1  1.7 - 7.7 K/uL Final  . Lymphocytes Relative 04/27/2013 8* 12 - 46 % Final  . Lymphs Abs 04/27/2013 0.3* 0.7 - 4.0 K/uL Final  . Monocytes Relative 04/27/2013 10  3 - 12 % Final  . Monocytes Absolute  04/27/2013 0.4  0.1 - 1.0 K/uL Final  . Eosinophils Relative 04/27/2013 9* 0 - 5 % Final  . Eosinophils Absolute 04/27/2013 0.4  0.0 - 0.7 K/uL Final  . Basophils Relative 04/27/2013 0  0 - 1 % Final  . Basophils Absolute 04/27/2013 0.0  0.0 - 0.1 K/uL Final    PATHOLOGY: FINAL for LAMIYA, NAAS ([id number]) Patient: DARIELYS, GIGLIA Collected: 02/04/2013 Client: [facility] Accession: [id number] Received: 02/05/2013 Hildred Laser DOB: 05-15-1950 Age: 34 Gender: F Reported: 02/08/2013 [address] Patient Ph: [phone] MRN #: [id number] [place] [id number] Visit #: [id number] Chart #: Phone: [phone] Fax: CC: REPORT OF SURGICAL PATHOLOGY FINAL DIAGNOSIS Diagnosis Rectum, biopsy - INVASIVE ADENOCARCINOMA ARISING IN A BACKGROUND OF TUBULAR ADENOMA. Microscopic Comment Dr. Laural Golden was notified on 02/08/13. (HCL:gt, 02/08/13) Aldona Bar MD Pathologist, Electronic Signature (Case signed 02/08/2013) Specimen Gross and Clinical Information Specimen(s) Obtained: Rectum, biopsy Specimen Clinical Information poor prep, rectal mass (kp) Gross Received in formalin are tan, soft tissue fragments that are submitted in toto. Number: seven, Size: 0.1 to 0.3 cm, one block. (SW:caf 02/05/13) Report signed out from the following location(s) Technical Component performed at [facility].[place], [place] [address] CLIA: [id number]., Interpretation performed at [facility].[place], [place],  [address]. CLIA #: [id number],  Pre combined modality Rectal Ultrasound      Urinalysis No results found for this basename: colorurine,  appearanceur,  labspec,  phurine,  glucoseu,  hgbur,  bilirubinur,  ketonesur,  proteinur,  urobilinogen,  nitrite,  leukocytesur    RADIOGRAPHIC STUDIES: No results found.  ASSESSMENT:  #1. Locally advanced adenocarcinoma of the rectum, tolerating combined  modality therapy well. Radiation therapy  completed on 05/04/2013 which was also the last day she received Xeloda, excellent tolerance. #2. Iron deficiency, on iron supplements,  #3. Diabetes mellitus, type II, insulin requiring, controlled.  #4. Hypertension, on treatment.  #5. Asthma, on treatment.  #6. Mental retardation, social worker power of attorney is Melissa.  #7. Past history of Helicobacter pylori gastritis, successfully treated.  #8. Allergic rhinitis, not symptomatic.    PLAN:  #1. Refer back to Dr. Arnoldo Morale for flexible and/or rigid sigmoidoscopy in anticipation of surgical planning, low anterior resection versus abdominoperineal resection. #2. Continue current medications. #3. Followup in one month. CBC, chem profile, CEA at that time.   All questions were answered. The patient knows to call the clinic with any problems, questions or concerns. We can certainly see the patient much sooner if necessary.   I spent 25 minutes counseling the patient face to face. The total time spent in the appointment was 30 minutes.    Doroteo Bradford, MD 05/18/2013 2:34 PM

## 2013-05-18 NOTE — Progress Notes (Signed)
[name] presented for labwork. Labs per MD order drawn via Peripheral Line 23 gauge needle inserted in Right AC  Good blood return present. Procedure without incident.  Needle removed intact. Patient tolerated procedure well.

## 2013-05-18 NOTE — Patient Instructions (Signed)
[facility] Discharge Instructions  RECOMMENDATIONS MADE BY THE CONSULTANT AND ANY TEST RESULTS WILL BE SENT TO YOUR REFERRING PHYSICIAN.  EXAM FINDINGS BY THE PHYSICIAN TODAY AND SIGNS OR SYMPTOMS TO REPORT TO CLINIC OR PRIMARY PHYSICIAN: Exam and findings as discussed by Dr. Barnet Glasgow.  Will make referral back to Dr. Arnoldo Morale for surgical opinion.  MEDICATIONS PRESCRIBED:  none  INSTRUCTIONS/FOLLOW-UP: Follow-up in 4 weeks.  Thank you for choosing [facility] to provide your oncology and hematology care.  To afford each patient quality time with our providers, please arrive at least 15 minutes before your scheduled appointment time.  With your help, our goal is to use those 15 minutes to complete the necessary work-up to ensure our physicians have the information they need to help with your evaluation and healthcare recommendations.    Effective January 1st, 2014, we ask that you re-schedule your appointment with our physicians should you arrive 10 or more minutes late for your appointment.  We strive to give you quality time with our providers, and arriving late affects you and other patients whose appointments are after yours.    Again, thank you for choosing [facility].  Our hope is that these requests will decrease the amount of time that you wait before being seen by our physicians.       _____________________________________________________________  Should you have questions after your visit to [facility], please contact our office at [phone] between the hours of 8:30 a.m. and 5:00 p.m.  Voicemails left after 4:30 p.m. will not be returned until the following business day.  For prescription refill requests, have your pharmacy contact our office with your prescription refill request.

## 2013-05-19 LAB — CEA: CEA: 5.6 ng/mL — ABNORMAL HIGH (ref 0.0–5.0)

## 2013-05-20 ENCOUNTER — Emergency Department (HOSPITAL_COMMUNITY)
Admission: EM | Admit: 2013-05-20 | Discharge: 2013-05-20 | Disposition: A | Discharge status: Home / Self Care | Payer: PRIVATE HEALTH INSURANCE | Attending: Emergency Medicine | Admitting: Emergency Medicine

## 2013-05-20 ENCOUNTER — Emergency Department (HOSPITAL_COMMUNITY): Payer: PRIVATE HEALTH INSURANCE

## 2013-05-20 ENCOUNTER — Encounter (HOSPITAL_COMMUNITY): Payer: Self-pay | Admitting: Emergency Medicine

## 2013-05-20 DIAGNOSIS — I10 Essential (primary) hypertension: Secondary | ICD-10-CM | POA: Insufficient documentation

## 2013-05-20 DIAGNOSIS — J45909 Unspecified asthma, uncomplicated: Secondary | ICD-10-CM | POA: Insufficient documentation

## 2013-05-20 DIAGNOSIS — S60229A Contusion of unspecified hand, initial encounter: Secondary | ICD-10-CM | POA: Insufficient documentation

## 2013-05-20 DIAGNOSIS — Z87440 Personal history of urinary (tract) infections: Secondary | ICD-10-CM | POA: Insufficient documentation

## 2013-05-20 DIAGNOSIS — M129 Arthropathy, unspecified: Secondary | ICD-10-CM | POA: Insufficient documentation

## 2013-05-20 DIAGNOSIS — G40909 Epilepsy, unspecified, not intractable, without status epilepticus: Secondary | ICD-10-CM | POA: Insufficient documentation

## 2013-05-20 DIAGNOSIS — Z794 Long term (current) use of insulin: Secondary | ICD-10-CM | POA: Insufficient documentation

## 2013-05-20 DIAGNOSIS — Z87891 Personal history of nicotine dependence: Secondary | ICD-10-CM | POA: Insufficient documentation

## 2013-05-20 DIAGNOSIS — Z8659 Personal history of other mental and behavioral disorders: Secondary | ICD-10-CM | POA: Insufficient documentation

## 2013-05-20 DIAGNOSIS — E119 Type 2 diabetes mellitus without complications: Secondary | ICD-10-CM | POA: Insufficient documentation

## 2013-05-20 DIAGNOSIS — Z85048 Personal history of other malignant neoplasm of rectum, rectosigmoid junction, and anus: Secondary | ICD-10-CM | POA: Insufficient documentation

## 2013-05-20 DIAGNOSIS — W2209XA Striking against other stationary object, initial encounter: Secondary | ICD-10-CM | POA: Insufficient documentation

## 2013-05-20 DIAGNOSIS — IMO0002 Reserved for concepts with insufficient information to code with codable children: Secondary | ICD-10-CM | POA: Insufficient documentation

## 2013-05-20 DIAGNOSIS — Z79899 Other long term (current) drug therapy: Secondary | ICD-10-CM | POA: Insufficient documentation

## 2013-05-20 DIAGNOSIS — Y939 Activity, unspecified: Secondary | ICD-10-CM | POA: Insufficient documentation

## 2013-05-20 DIAGNOSIS — Z7982 Long term (current) use of aspirin: Secondary | ICD-10-CM | POA: Insufficient documentation

## 2013-05-20 DIAGNOSIS — Y929 Unspecified place or not applicable: Secondary | ICD-10-CM | POA: Insufficient documentation

## 2013-05-20 HISTORY — DX: Unspecified osteoarthritis, unspecified site: M19.90

## 2013-05-20 HISTORY — DX: Gout, unspecified: M10.9

## 2013-05-20 LAB — CBG MONITORING, ED: Glucose-Capillary: 138 mg/dL — ABNORMAL HIGH (ref 70–99)

## 2013-05-20 MED ORDER — IBUPROFEN 600 MG PO TABS: 600.0000 mg | ORAL_TABLET | Freq: Four times a day (QID) | ORAL | Status: DC | PRN

## 2013-05-20 NOTE — ED Notes (Signed)
Pt states she thinks she may have hit her left hand while getting out of shower yesterday. Pt also states hx of gout and arthritis.Swelling to knuckle of left hand.

## 2013-05-20 NOTE — Discharge Instructions (Signed)
Contusion A contusion is a deep bruise. Contusions are the result of an injury that caused bleeding under the skin. The contusion may turn blue, purple, or yellow. Minor injuries will give you a painless contusion, but more severe contusions may stay painful and swollen for a few weeks.  CAUSES  A contusion is usually caused by a blow, trauma, or direct force to an area of the body. SYMPTOMS   Swelling and redness of the injured area.  Bruising of the injured area.  Tenderness and soreness of the injured area.  Pain. DIAGNOSIS  The diagnosis can be made by taking a history and physical exam. An X-ray, CT scan, or MRI may be needed to determine if there were any associated injuries, such as fractures. TREATMENT  Specific treatment will depend on what area of the body was injured. In general, the best treatment for a contusion is resting, icing, elevating, and applying cold compresses to the injured area. Over-the-counter medicines may also be recommended for pain control. Ask your caregiver what the best treatment is for your contusion. HOME CARE INSTRUCTIONS   Put ice on the injured area.  Put ice in a plastic bag.  Place a towel between your skin and the bag.  Leave the ice on for 15-20 minutes, 03-04 times a day.  Only take over-the-counter or prescription medicines for pain, discomfort, or fever as directed by your caregiver. Your caregiver may recommend avoiding anti-inflammatory medicines (aspirin, ibuprofen, and naproxen) for 48 hours because these medicines may increase bruising.  Rest the injured area.  If possible, elevate the injured area to reduce swelling. SEEK IMMEDIATE MEDICAL CARE IF:   You have increased bruising or swelling.  You have pain that is getting worse.  Your swelling or pain is not relieved with medicines. MAKE SURE YOU:   Understand these instructions.  Will watch your condition.  Will get help right away if you are not doing well or get  worse. Document Released: 12/12/2004 Document Revised: 05/27/2011 Document Reviewed: 01/07/2011 [facility] Patient Information 2014 [place], [place].   Your xray is normal today.  You may have a hand contusion from hitting your hand on your shower as you state you may have done.  However, this could also be an early flare of your gout.  Use the ibuprofen and apply warm compresses to your hand (15 minutes for 3-4 times daily).  Use your hydrocodone which Dr Luan Pulling has prescribed for you if needed for additional pain relief.

## 2013-05-20 NOTE — ED Notes (Signed)
Pt states area on L hand, located near pointer finger, is swollen with stabbing pain. Area on L hand is noticeably swollen compared to R. Skin is in tact and dry. Fingers on left hand deviate slightly away from thumb only on L hand.

## 2013-05-20 NOTE — ED Provider Notes (Signed)
Medical screening examination/treatment/procedure(s) were performed by non-physician practitioner and as supervising physician I was immediately available for consultation/collaboration.   EKG Interpretation None        Maudry Diego, MD 05/20/13 [id number]

## 2013-05-20 NOTE — ED Provider Notes (Signed)
CSN: [id number]     Arrival date & time 05/20/13  1437 History   First MD Initiated Contact with Patient 05/20/13 1455     Chief Complaint  Patient presents with  . Hand Problem     (Consider location/radiation/quality/duration/timing/severity/associated sxs/prior Treatment) HPI Comments: [name] is a 63 y.o. Female with a history of MR and gout (in her metatarsals with flares)  presenting with left hand pain and swelling since yesterday. She states she may have hit her hand on her shower door yesterday morning but is unsure.  She states her pain is "painful" and cannot qualify further, although does not believe it feels like gout.  There is no radiation of pain and she had found nothing that relieves her symptoms.  She is left handed (and is holding a pencil in her injured hand and working on a word find puzzle during this ed visit).   The history is provided by the patient and the nursing home.    Past Medical History  Diagnosis Date  . Hypertension   . Diabetes mellitus     years  . Seizures   . Asthma   . Depression   . Mental retardation   . History of recurrent UTIs   . Shortness of breath     with exertion  . Cancer     Rectal   . Arthritis   . Gout    Past Surgical History  Procedure Laterality Date  . Abdominal hysterectomy    . Multiple extractions with alveoloplasty N/A 11/23/2012    Procedure: MULTIPLE EXTRACION 1, 2, 4, 5, 6, 7, 8, 9, 10, 11, 12, 13, 14, 17, 18, 20, 23, 24, 25, 26, 28, 29, 32 WITH ALVEOLOPLASTY, REMOVE BILATERAL TORI;  Surgeon: Gae Bon, DDS;  Location: [facility];  Service: Oral Surgery;  Laterality: N/A;  . Colonoscopy with esophagogastroduodenoscopy (egd) N/A 01/14/2013    Procedure: COLONOSCOPY WITH ESOPHAGOGASTRODUODENOSCOPY (EGD);  Surgeon: Rogene [place], MD;  [facility];  Service: Endoscopy;  Laterality: N/A;  250-moved to 315 Ann to notify pt  . Colonoscopy N/A 02/04/2013    Procedure: COLONOSCOPY;  Surgeon: Rogene [place], MD;  [facility];  Service: Endoscopy;  Laterality: N/A;  225  . Eus N/A 02/18/2013    Procedure: LOWER ENDOSCOPIC ULTRASOUND (EUS);  Surgeon: Milus Banister, MD;  Location: [facility];  Service: Endoscopy;  Laterality: N/A;   No family history on file. History  Substance Use Topics  . Smoking status: Former Smoker    Types: Cigarettes  . Smokeless tobacco: Never Used     Comment: unknown when stopped smoking      long time  . Alcohol Use: No   OB History   Grav Para Term Preterm Abortions TAB SAB Ect Mult Living                 Review of Systems  Constitutional: Negative for fever.  Musculoskeletal: Positive for arthralgias and joint swelling. Negative for myalgias.  Skin: Positive for color change.  Neurological: Negative for weakness and numbness.      Allergies  Review of patient's allergies indicates no known allergies.  Home Medications   Current Outpatient Rx  Name  Route  Sig  Dispense  Refill  . amLODipine (NORVASC) 2.5 MG tablet   Oral   Take 2.5 mg by mouth daily.         [name] aspirin EC 81 MG tablet   Oral   Take  81 mg by mouth daily.         . capecitabine (XELODA) 500 MG tablet   Oral   Take 1,500 mg by mouth 2 (two) times daily.         . ferrous sulfate 325 (65 FE) MG tablet      TAKE (1) TABLET BY MOUTH TWICE DAILY.   180 tablet   1   . fish oil-omega-3 fatty acids 1000 MG capsule   Oral   Take 1 g by mouth 2 (two) times daily.         . Fluticasone-Salmeterol (ADVAIR) 250-50 MCG/DOSE AEPB   Inhalation   Inhale 1 puff into the lungs every 12 (twelve) hours.         . Insulin Glargine (LANTUS SOLOSTAR) 100 UNIT/ML SOPN   Subcutaneous   Inject 20 Units into the skin at bedtime.          [name] loratadine-pseudoephedrine (LORATADINE-D 24HR) 10-240 MG per 24 hr tablet   Oral   Take 1 tablet by mouth daily.         . metFORMIN (GLUCOPHAGE) 1000 MG tablet   Oral   Take 1,000 mg by mouth 2 (two) times daily  with a meal.         . metoprolol succinate (TOPROL-XL) 50 MG 24 hr tablet   Oral   Take 100 mg by mouth at bedtime. Take with or immediately following a meal.         . montelukast (SINGULAIR) 10 MG tablet   Oral   Take 10 mg by mouth at bedtime.         . pantoprazole (PROTONIX) 40 MG tablet   Oral   Take 40 mg by mouth daily.         . phenytoin (DILANTIN) 100 MG ER capsule   Oral   Take 100 mg by mouth 2 (two) times daily.         . pravastatin (PRAVACHOL) 40 MG tablet   Oral   Take 40 mg by mouth daily.         [name] triamcinolone cream (KENALOG) 0.1 %   Topical   Apply 1 application topically daily. To legs         . ibuprofen (ADVIL,MOTRIN) 600 MG tablet   Oral   Take 1 tablet (600 mg total) by mouth every 6 (six) hours as needed for moderate pain (or for treatment of gout).   15 tablet   0    BP 181/69  Pulse 90  Temp(Src) 98.1 F (36.7 C) (Oral)  Resp 18  SpO2 100% Physical Exam  Constitutional: She appears well-developed and well-nourished.  HENT:  Head: Atraumatic.  Neck: Normal range of motion.  Cardiovascular:  Pulses equal bilaterally  Musculoskeletal: She exhibits edema and tenderness.  Ttp, edema with mild erythema without increased warmth left dorsal 2nd mcp joint.  No red streaking, no fluctuance or induration.  Forearm nontender.  Distal sensation intact, less than 3 sec cap refill.  Neurological: She is alert. She has normal strength. She displays normal reflexes. No sensory deficit.  Skin: Skin is warm and dry.  Psychiatric: She has a normal mood and affect.    ED Course  Procedures (including critical care time) Labs Review Labs Reviewed  CBG MONITORING, ED - Abnormal; Notable for the following:    Glucose-Capillary 138 (*)    All other components within normal limits   Imaging Review Dg Hand Complete Left  05/20/2013  CLINICAL DATA:  Pain and injury.  Pain  EXAM: LEFT HAND - COMPLETE 3+ VIEW  COMPARISON:  09/18/2009   FINDINGS: Chronic widening of the scapholunate joint suggesting chronic ligament injury.  Negative for acute fracture. Normal alignment. 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 Chronic scaphoid lunate ligament disruption.   Electronically Signed   By: Franchot Gallo M.D.   On: 05/20/2013 15:35     EKG Interpretation None      MDM   Final diagnoses:  Hand contusion    Suspect hand contusion,  Pt prescribed ibuprofen,  She has hydrocodone listed on her MAR from her nursing facility - encouraged her to ask for this prn if needed for pain.  May be early gout flare which ibuprofen should cover.  Encouraged recheck by pcp if not improved.  Patients labs and/or radiological studies were viewed and considered during the medical decision making and disposition process.     Evalee [facility], PA-C 05/20/13 [phone]

## 2013-06-15 ENCOUNTER — Ambulatory Visit (HOSPITAL_COMMUNITY): Payer: PRIVATE HEALTH INSURANCE

## 2013-06-16 NOTE — Progress Notes (Signed)
This encounter was created in error - please disregard.

## 2013-06-29 NOTE — Progress Notes (Signed)
[name], MD [place] [address]  Rectal cancer  Rectal carcinoma - Plan: CBC with Differential, Comprehensive metabolic panel, CEA  CURRENT THERAPY: S/P concomitant chemoradiation with Xeloda from 03/25/2013- 05/04/2013  INTERVAL HISTORY: [name] 63 y.o. female returns for  regular  visit for followup of carcinoma of the rectum, locally advanced.  S/P concomitant chemoradiation with Xeloda from 03/25/2013- 05/04/2013.    Rectal cancer   02/18/2013 Initial Diagnosis Rectal cancer   03/25/2013 - 05/05/2013 Radiation Therapy Pelvis treatment from 1/8- 2/12 with rectal boost from 2/13- 05/05/2013.   03/25/2013 - 05/05/2013 Chemotherapy Xeloda 1500 mg BID 5 days/week with radiation therapy.   I personally reviewed and went over laboratory results with the patient.  The results are noted within this dictation.  I personally reviewed and went over radiographic studies with the patient.  The results are noted within this dictation.   The patient reported to the ED on 05/20/2013 with "hand issues."  Xray of hand is negative for any acute findings.    I spoke with Dr. Arnoldo Morale on the telephone today and he has the patient scheduled for a low anterior resection with possible APR on May 11.  As a result we will see her in mid-June, about 4 weeks following surgery.  Oncologically, she denies any complaints and ROS questioning is negative.    Past Medical History  Diagnosis Date  . Hypertension   . Diabetes mellitus     years  . Seizures   . Asthma   . Depression   . Mental retardation   . History of recurrent UTIs   . Shortness of breath     with exertion  . Cancer     Rectal   . Arthritis   . Gout     has DIABETES MELLITUS, TYPE II, UNCONTROLLED; HYPERLIPIDEMIA; GOUT NOS; UNSPECIFIED DEFICIENCY ANEMIA; ANEMIA; DEPRESSION; RETARDATION, MENTAL NOS; HYPERTENSION; SINUS TACHYCARDIA; ALLERGIC RHINITIS; ASTHMA; ASTHMA, WITH ACUTE EXACERBATION;  DISEASE, PANCREAS NOS; INTERTRIGO, CANDIDAL; ARTHRITIS; SEIZURE DISORDER; UNSPECIFIED SLEEP DISTURBANCE; FLANK PAIN, LEFT; LIVER FUNCTION TESTS, ABNORMAL; MIGRAINES, HX OF; Elevated liver enzymes; Helicobacter pylori gastritis; and Rectal cancer on her problem list.     has No Known Allergies.  Ms. Riquelme does not currently have medications on file.  Past Surgical History  Procedure Laterality Date  . Abdominal hysterectomy    . Multiple extractions with alveoloplasty N/A 11/23/2012    Procedure: MULTIPLE EXTRACION 1, 2, 4, 5, 6, 7, 8, 9, 10, 11, 12, 13, 14, 17, 18, 20, 23, 24, 25, 26, 28, 29, 32 WITH ALVEOLOPLASTY, REMOVE BILATERAL TORI;  Surgeon: Gae Bon, DDS;  Location: [facility];  Service: Oral Surgery;  Laterality: N/A;  . Colonoscopy with esophagogastroduodenoscopy (egd) N/A 01/14/2013    Procedure: COLONOSCOPY WITH ESOPHAGOGASTRODUODENOSCOPY (EGD);  Surgeon: Rogene Houston, MD;  [facility];  Service: Endoscopy;  Laterality: N/A;  250-moved to 315 Ann to notify pt  . Colonoscopy N/A 02/04/2013    Procedure: COLONOSCOPY;  Surgeon: Rogene Houston, MD;  Location: [facility];  Service: Endoscopy;  Laterality: N/A;  225  . Eus N/A 02/18/2013    Procedure: LOWER ENDOSCOPIC ULTRASOUND (EUS);  Surgeon: Milus Banister, MD;  Location: [facility];  Service: Endoscopy;  Laterality: N/A;    Denies any headaches, dizziness, double vision, fevers, chills, night sweats, nausea, vomiting, diarrhea, constipation, chest pain, heart palpitations, shortness of breath, blood in stool, black tarry stool, urinary pain, urinary burning, urinary  frequency, hematuria.   PHYSICAL EXAMINATION  ECOG PERFORMANCE STATUS: 0 - Asymptomatic  Filed Vitals:   07/01/13 1400  BP: 161/82  Pulse: 87  Temp: 97.9 F (36.6 C)  Resp: 18    GENERAL:alert, no distress, well nourished, well developed, comfortable, cooperative, obese and smiling SKIN: skin color, texture, turgor are normal, no rashes  or significant lesions HEAD: Normocephalic, No masses, lesions, tenderness or abnormalities EYES: normal, PERRLA, EOMI EARS: External ears normal OROPHARYNX:mucous membranes are moist  NECK: supple, trachea midline LYMPH:  not examined BREAST:not examined LUNGS: clear to auscultation  HEART: regular rate & rhythm, no murmurs and no gallops ABDOMEN:abdomen soft, obese and normal bowel sounds BACK: Back symmetric, no curvature. EXTREMITIES:less then 2 second capillary refill, no joint deformities, effusion, or inflammation, no skin discoloration, no cyanosis  NEURO: alert & oriented x 3 with fluent speech, no focal motor/sensory deficits, gait normal   LABORATORY DATA: CBC    Component Value Date/Time   WBC 7.3 05/18/2013 1412   RBC 3.33* 05/18/2013 1412   RBC 3.84* 11/07/2009 1649   HGB 9.5* 05/18/2013 1412   HCT 29.3* 05/18/2013 1412   PLT 209 05/18/2013 1412   MCV 88.0 05/18/2013 1412   MCH 28.5 05/18/2013 1412   MCHC 32.4 05/18/2013 1412   RDW 17.1* 05/18/2013 1412   LYMPHSABS 0.5* 05/18/2013 1412   MONOABS 0.7 05/18/2013 1412   EOSABS 0.3 05/18/2013 1412   BASOSABS 0.0 05/18/2013 1412      Chemistry      Component Value Date/Time   NA 137 05/18/2013 1342   K 5.2 05/18/2013 1342   CL 102 05/18/2013 1342   CO2 23 05/18/2013 1342   BUN 31* 05/18/2013 1342   CREATININE 1.19* 05/18/2013 1342   CREATININE 1.11* 02/08/2013 1451      Component Value Date/Time   CALCIUM 7.8* 05/18/2013 1342   ALKPHOS 151* 05/18/2013 1342   AST 16 05/18/2013 1342   ALT 10 05/18/2013 1342   BILITOT <0.2* 05/18/2013 1342        ASSESSMENT:  1. Locally advanced adenocarcinoma of the rectum, tolerating combined modality therapy well. Radiation therapy completed on 05/04/2013 which was also the last day she received Xeloda, excellent tolerance.  2. Iron deficiency, on iron supplements,  3. Diabetes mellitus, type II, insulin requiring, controlled.  4. Hypertension, on treatment.  5. Asthma, on treatment.  6. Mental retardation,  social worker power of attorney is Melissa.  7. Past history of Helicobacter pylori gastritis, successfully treated.  8. Allergic rhinitis, not symptomatic.   Patient Active Problem List   Diagnosis Date Noted  . Rectal cancer 02/18/2013  . Helicobacter pylori gastritis 02/08/2013  . Elevated liver enzymes 12/02/2012  . ANEMIA 08/29/2008  . ASTHMA, WITH ACUTE EXACERBATION 07/01/2008  . FLANK PAIN, LEFT 05/27/2008  . SINUS TACHYCARDIA 03/02/2008  . UNSPECIFIED DEFICIENCY ANEMIA 10/21/2007  . INTERTRIGO, CANDIDAL 10/19/2007  . DIABETES MELLITUS, TYPE II, UNCONTROLLED 07/27/2007  . LIVER FUNCTION TESTS, ABNORMAL 04/28/2007  . UNSPECIFIED SLEEP DISTURBANCE 02/03/2007  . DISEASE, PANCREAS NOS 11/20/2006  . HYPERLIPIDEMIA 11/03/2006  . GOUT NOS 11/03/2006  . DEPRESSION 11/03/2006  . RETARDATION, MENTAL NOS 11/03/2006  . HYPERTENSION 11/03/2006  . ALLERGIC RHINITIS 11/03/2006  . ASTHMA 11/03/2006  . ARTHRITIS 11/03/2006  . SEIZURE DISORDER 11/03/2006  . MIGRAINES, HX OF 11/03/2006     PLAN:  1. I personally reviewed and went over laboratory results with the patient.  The results are noted within this dictation. 2. I personally reviewed and  went over radiographic studies with the patient.  The results are noted within this dictation.   3. Chart reviewed 4. I spoke with Dr.Jenkins (Gen Surg) and he plans on performing a low anterior resection and possible APR on Jul 26, 2013. 5. Labs today: CBC diff, CMET, CEA 6. Return in mid-June 2015, about 4 weeks out from surgery.   THERAPY PLAN:  She will undergo a low anterior resection with possible APR on Jul 26, 2013 by Dr. Arnoldo Morale.  All questions were answered. The patient knows to call the clinic with any problems, questions or concerns. We can certainly see the patient much sooner if necessary.  Patient and plan discussed with Dr. Farrel Gobble and he is in agreement with the aforementioned.   [facility] 07/01/2013

## 2013-06-30 NOTE — H&P (Signed)
  NTS SOAP Note  Vital Signs:  Vitals as of: 2/95/6213: Systolic 086: Diastolic 84: Heart Rate 108: Temp 97.31F: Height 70ft 3.5in: Weight 229Lbs 0 Ounces: BMI 39.93  BMI : 39.93 kg/m2  Subjective: This 27 [place] Female presents for newly diagnoses rectal cancer.  Biopsy proven by colonoscopy, EUS shows 7cm long mass, 5cm from anal verge, one lymph node suspicious for metastatic disease.  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  Does not have abdominal pain currently.  Has undergone neoadjuvant therapy and now presents for resection of her rectal carcinoma.  Review of Symptoms:  Constitutional:unremarkable   Head:unremarkable    Eyes:unremarkable   Nose/Mouth/Throat:unremarkable Cardiovascular:  unremarkable   Respiratory:  dyspnea,cough Gastrointestinal:  unremarkable   Genitourinary:unremarkable       back, neck pain dry skin Hematolgic/Lymphatic:unremarkable     Allergic/Immunologic:unremarkable     Past Medical History:    Reviewed   Past Medical History  Surgical History: none Medical Problems: COPD, seizures, DM, high cholesterol, morbid obesity, HTN, rectal cancer Allergies: nkda Medications: protonix, claritin, lisinopril, metformin, insulin, advair, phenytoin, pravastatin, metoprolol   Social History:Reviewed  Social History  Preferred Language: English Race:  Other Ethnicity: Not Hispanic / Latino Age: 63 Years 7 Months Marital Status:  S   Smoking Status: Never smoker reviewed on 06/29/2013 Functional Status reviewed on mm/dd/yyyy ------------------------------------------------ Bathing: Normal Cooking: Normal Dressing: Normal Driving: Normal Eating: Normal Managing Meds: Normal Oral Care: Normal Shopping: Normal Toileting: Normal Transferring: Normal Walking: Normal Cognitive Status reviewed on mm/dd/yyyy ------------------------------------------------ Attention: Normal Decision  Making: Normal Language: Normal Memory: Normal Motor: Normal Perception: Normal Problem Solving: Normal Visual and Spatial: Normal   Family History:  Reviewed  Family Health History Family History is Unknown    Objective Information: General:  Well appearing, well nourished in no distress. Neck:  Supple without lymphadenopathy.  Heart:  RRR, no murmur Lungs:    CTA bilaterally, no wheezes, rhonchi, rales.  Breathing unlabored. Abdomen:Soft, NT/ND, no HSM, no masses.   Could not feel rectal mass to digital exam  Assessment:Rectal carcinoma, +lymph node involvement  Diagnosis & Procedure Smart Code   Plan: Scheduled for flexible sigmoidoscopy, low anterior resection, possible abdominoperineal resection on 07/26/2013. The risks and benefits of the procedure including bleeding, infection, cardiopulmonary difficulties, the possibility of a blood transfusion, and the possibility of a permanent colostomy were fully explained to the patient and her state appointed power of attorney, who gave informed consent.  Patient Education:Alternative treatments to surgery were discussed with patient (and family).  Risks and benefits  of procedure were fully explained to the patient (and family) who gave informed consent. Patient/family questions were addressed.

## 2013-07-01 ENCOUNTER — Encounter (HOSPITAL_COMMUNITY): Payer: Self-pay | Admitting: Oncology

## 2013-07-01 ENCOUNTER — Encounter (HOSPITAL_COMMUNITY): Payer: PRIVATE HEALTH INSURANCE | Attending: Hematology and Oncology | Admitting: Oncology

## 2013-07-01 VITALS — BP 161/82 | HR 87 | Temp 97.9°F | Resp 18 | Wt 224.0 lb

## 2013-07-01 DIAGNOSIS — D509 Iron deficiency anemia, unspecified: Secondary | ICD-10-CM

## 2013-07-01 DIAGNOSIS — C20 Malignant neoplasm of rectum: Secondary | ICD-10-CM | POA: Insufficient documentation

## 2013-07-01 DIAGNOSIS — E119 Type 2 diabetes mellitus without complications: Secondary | ICD-10-CM

## 2013-07-01 DIAGNOSIS — I10 Essential (primary) hypertension: Secondary | ICD-10-CM

## 2013-07-01 LAB — CBC WITH DIFFERENTIAL/PLATELET
BASOS PCT: 0 % (ref 0–1)
Basophils Absolute: 0 10*3/uL (ref 0.0–0.1)
EOS ABS: 0.3 10*3/uL (ref 0.0–0.7)
EOS PCT: 6 % — AB (ref 0–5)
HCT: 31.5 % — ABNORMAL LOW (ref 36.0–46.0)
HEMOGLOBIN: 10 g/dL — AB (ref 12.0–15.0)
Lymphocytes Relative: 10 % — ABNORMAL LOW (ref 12–46)
Lymphs Abs: 0.5 10*3/uL — ABNORMAL LOW (ref 0.7–4.0)
MCH: 28.4 pg (ref 26.0–34.0)
MCHC: 31.7 g/dL (ref 30.0–36.0)
MCV: 89.5 fL (ref 78.0–100.0)
MONOS PCT: 10 % (ref 3–12)
Monocytes Absolute: 0.5 10*3/uL (ref 0.1–1.0)
NEUTROS PCT: 74 % (ref 43–77)
Neutro Abs: 3.8 10*3/uL (ref 1.7–7.7)
Platelets: 231 10*3/uL (ref 150–400)
RBC: 3.52 MIL/uL — ABNORMAL LOW (ref 3.87–5.11)
RDW: 14.6 % (ref 11.5–15.5)
WBC: 5.2 10*3/uL (ref 4.0–10.5)

## 2013-07-01 LAB — COMPREHENSIVE METABOLIC PANEL
ALBUMIN: 3.4 g/dL — AB (ref 3.5–5.2)
ALK PHOS: 222 U/L — AB (ref 39–117)
ALT: 20 U/L (ref 0–35)
AST: 23 U/L (ref 0–37)
BUN: 28 mg/dL — AB (ref 6–23)
CALCIUM: 8.6 mg/dL (ref 8.4–10.5)
CO2: 28 mEq/L (ref 19–32)
CREATININE: 1.04 mg/dL (ref 0.50–1.10)
Chloride: 100 mEq/L (ref 96–112)
GFR calc Af Amer: 66 mL/min — ABNORMAL LOW (ref >90)
GFR calc non Af Amer: 57 mL/min — ABNORMAL LOW (ref >90)
Glucose, Bld: 77 mg/dL (ref 70–99)
Potassium: 5.2 mEq/L (ref 3.7–5.3)
Sodium: 139 mEq/L (ref 137–147)
TOTAL PROTEIN: 7.5 g/dL (ref 6.0–8.3)
Total Bilirubin: <0.2 mg/dL — ABNORMAL LOW (ref 0.3–1.2)

## 2013-07-01 NOTE — Patient Instructions (Signed)
[facility] Discharge Instructions  RECOMMENDATIONS MADE BY THE CONSULTANT AND ANY TEST RESULTS WILL BE SENT TO YOUR REFERRING PHYSICIAN.  EXAM FINDINGS BY THE PHYSICIAN TODAY AND SIGNS OR SYMPTOMS TO REPORT TO CLINIC OR PRIMARY PHYSICIAN: Exam and findings as discussed by Robynn Pane, PA-C.  Dr. Arnoldo Morale plans surgery on 07/26/13.  We will see you after surgery in June.  MEDICATIONS PRESCRIBED:  none  INSTRUCTIONS/FOLLOW-UP: Follow-up in June to discuss further treatment options.  Thank you for choosing [facility] to provide your oncology and hematology care.  To afford each patient quality time with our providers, please arrive at least 15 minutes before your scheduled appointment time.  With your help, our goal is to use those 15 minutes to complete the necessary work-up to ensure our physicians have the information they need to help with your evaluation and healthcare recommendations.    Effective January 1st, 2014, we ask that you re-schedule your appointment with our physicians should you arrive 10 or more minutes late for your appointment.  We strive to give you quality time with our providers, and arriving late affects you and other patients whose appointments are after yours.    Again, thank you for choosing [facility].  Our hope is that these requests will decrease the amount of time that you wait before being seen by our physicians.       _____________________________________________________________  Should you have questions after your visit to [facility], please contact our office at [phone] between the hours of 8:30 a.m. and 5:00 p.m.  Voicemails left after 4:30 p.m. will not be returned until the following business day.  For prescription refill requests, have your pharmacy contact our office with your prescription refill request.

## 2013-07-02 LAB — CEA: CEA: 3.6 ng/mL (ref 0.0–5.0)

## 2013-07-12 ENCOUNTER — Encounter (HOSPITAL_COMMUNITY): Payer: Self-pay | Admitting: Pharmacist

## 2013-07-16 ENCOUNTER — Encounter (HOSPITAL_COMMUNITY): Payer: Self-pay

## 2013-07-16 ENCOUNTER — Encounter (HOSPITAL_COMMUNITY)
Admission: RE | Admit: 2013-07-16 | Discharge: 2013-07-16 | Disposition: A | Discharge status: Home / Self Care | Payer: PRIVATE HEALTH INSURANCE | Source: Ambulatory Visit | Attending: General Surgery | Admitting: General Surgery

## 2013-07-16 ENCOUNTER — Other Ambulatory Visit: Payer: Self-pay

## 2013-07-16 ENCOUNTER — Ambulatory Visit (HOSPITAL_COMMUNITY)
Admission: RE | Admit: 2013-07-16 | Discharge: 2013-07-16 | Disposition: A | Discharge status: Home / Self Care | Payer: PRIVATE HEALTH INSURANCE | Source: Ambulatory Visit | Attending: General Surgery | Admitting: General Surgery

## 2013-07-16 DIAGNOSIS — J449 Chronic obstructive pulmonary disease, unspecified: Secondary | ICD-10-CM | POA: Insufficient documentation

## 2013-07-16 DIAGNOSIS — I10 Essential (primary) hypertension: Secondary | ICD-10-CM | POA: Insufficient documentation

## 2013-07-16 DIAGNOSIS — Z01810 Encounter for preprocedural cardiovascular examination: Secondary | ICD-10-CM | POA: Insufficient documentation

## 2013-07-16 DIAGNOSIS — E119 Type 2 diabetes mellitus without complications: Secondary | ICD-10-CM | POA: Insufficient documentation

## 2013-07-16 DIAGNOSIS — J4489 Other specified chronic obstructive pulmonary disease: Secondary | ICD-10-CM | POA: Insufficient documentation

## 2013-07-16 DIAGNOSIS — Z01812 Encounter for preprocedural laboratory examination: Secondary | ICD-10-CM | POA: Insufficient documentation

## 2013-07-16 DIAGNOSIS — C20 Malignant neoplasm of rectum: Secondary | ICD-10-CM | POA: Insufficient documentation

## 2013-07-16 LAB — COMPREHENSIVE METABOLIC PANEL
ALK PHOS: 254 U/L — AB (ref 39–117)
ALT: 12 U/L (ref 0–35)
AST: 15 U/L (ref 0–37)
Albumin: 3.4 g/dL — ABNORMAL LOW (ref 3.5–5.2)
BUN: 29 mg/dL — AB (ref 6–23)
CHLORIDE: 101 meq/L (ref 96–112)
CO2: 30 meq/L (ref 19–32)
CREATININE: 1.09 mg/dL (ref 0.50–1.10)
Calcium: 8.9 mg/dL (ref 8.4–10.5)
GFR calc Af Amer: 62 mL/min — ABNORMAL LOW (ref >90)
GFR calc non Af Amer: 53 mL/min — ABNORMAL LOW (ref >90)
Glucose, Bld: 156 mg/dL — ABNORMAL HIGH (ref 70–99)
Potassium: 5.6 mEq/L — ABNORMAL HIGH (ref 3.7–5.3)
Sodium: 140 mEq/L (ref 137–147)
Total Protein: 7.2 g/dL (ref 6.0–8.3)

## 2013-07-16 LAB — CBC WITH DIFFERENTIAL/PLATELET
Basophils Absolute: 0 10*3/uL (ref 0.0–0.1)
Basophils Relative: 0 % (ref 0–1)
Eosinophils Absolute: 0.3 10*3/uL (ref 0.0–0.7)
Eosinophils Relative: 5 % (ref 0–5)
HEMATOCRIT: 31.7 % — AB (ref 36.0–46.0)
HEMOGLOBIN: 10 g/dL — AB (ref 12.0–15.0)
LYMPHS ABS: 0.5 10*3/uL — AB (ref 0.7–4.0)
LYMPHS PCT: 9 % — AB (ref 12–46)
MCH: 28.1 pg (ref 26.0–34.0)
MCHC: 31.5 g/dL (ref 30.0–36.0)
MCV: 89 fL (ref 78.0–100.0)
MONO ABS: 0.5 10*3/uL (ref 0.1–1.0)
MONOS PCT: 9 % (ref 3–12)
Neutro Abs: 4.1 10*3/uL (ref 1.7–7.7)
Neutrophils Relative %: 77 % (ref 43–77)
Platelets: 230 10*3/uL (ref 150–400)
RBC: 3.56 MIL/uL — AB (ref 3.87–5.11)
RDW: 13.6 % (ref 11.5–15.5)
WBC: 5.4 10*3/uL (ref 4.0–10.5)

## 2013-07-16 NOTE — Pre-Procedure Instructions (Signed)
Caseworker given information to sign up for my chart at home.

## 2013-07-16 NOTE — Patient Instructions (Addendum)
[name]  07/16/2013   Your procedure is scheduled on:   07/26/2013  Report to [facility] at  79 AM.  Call this number if you have problems the morning of surgery: [phone]   Remember:   Do not eat food or drink liquids after midnight.   Take these medicines the morning of surgery with A SIP OF WATER: amlodipine, loratadine, metoprolol, singulair, oxycodone, protonix, dilantin. Take 1/2 Lantus the night before your surgery. Take your advair before you come.   Do not wear jewelry, make-up or nail polish.  Do not wear lotions, powders, or perfumes.   Do not shave 48 hours prior to surgery. Men may shave face and neck.  Do not bring valuables to the hospital.  [facility] is not responsible for any belongings or valuables.               Contacts, dentures or bridgework may not be worn into surgery.  Leave suitcase in the car. After surgery it may be brought to your room.  For patients admitted to the hospital, discharge time is determined by your treatment team.               Patients discharged the day of surgery will not be allowed to drive home.  Name and phone number of your driver: family  Special Instructions: Shower using CHG 2 nights before surgery and the night before surgery.  If you shower the day of surgery use CHG.  Use special wash - you have one bottle of CHG for all showers.  You should use approximately 1/3 of the bottle for each shower.   Please read over the following fact sheets that you were given: Pain Booklet, Coughing and Deep Breathing, Surgical Site Infection Prevention, Anesthesia Post-op Instructions and Care and Recovery After Surgery Open Colectomy An open colectomy is surgery to take out part or all of the large intestine (colon). This procedure is used to treat several conditions, including:  Inflammation and infection of the colon (diverticulitis).  Tumors or masses in the colon.  Inflammatory bowel disease, such as Crohn disease or  ulcerative colitis. Colectomy is an option when symptoms cannot be controlled with medicines.  Bleeding from the colon that cannot be controlled by another method.  Blockage or obstruction of the colon. LET [facility] CARE PROVIDER KNOW ABOUT:  Any allergies you have.  All medicines you are taking, including vitamins, herbs, eye drops, creams, and over-the-counter medicines.  Previous problems you or members of your family have had with the use of anesthetics.  Any blood disorders you have.  Previous surgeries you have had.  Medical conditions you have. RISKS AND COMPLICATIONS  Generally, this is a safe procedure. However, as with any procedure, complications can occur. Possible complications include:  An infection developing in the area where the surgery was done.  Problems with the incisions, including:  Bleeding from an incision.  The wound reopening.  Tissues from inside the abdomen bulging through the incision (hernia).  Bleeding inside the abdomen.  Reopening of the colon where it was stitched or stapled together. This is a serious complication. Another procedure may be needed to fix the problem.  Damage to other organs in the abdomen.  A blood clot forming in a vein and traveling to the lungs.  Future blockage of the small intestine from scar tissue. Another surgery may be needed to repair this. BEFORE THE PROCEDURE  Various tests may be done before the procedure.  These may include:  Blood tests.  A test to check the heart's rhythm (electrocardiography).  A CT scan to get pictures of your abdomen.  Ask your health care provider about changing or stopping any regular medicines. You will need to stop taking aspirin and nonsteroidal anti-inflammatory drugs (NSAIDs) at least 5 days before the surgery. You will also need to stop taking any blood thinners and vitamin E.  You may be prescribed an oral bowel prep. This involves drinking a large amount of medicated  liquid, starting the day before your surgery. The liquid will cause you to have multiple loose stools until your stool is almost clear or light green. This cleans out your colon in preparation for the surgery.  Do not eat or drink anything else once you have started the bowel prep, unless your health care provider tells you it is safe to do so.  You may also be given antibiotic pills to clean out your colon of bacteria. Be sure to follow the directions carefully and take the medicine at the correct time.  Make plans to have someone drive you home after your hospital stay. Also arrange for someone to help you with activities during your recovery. PROCEDURE This surgery can take 2 4 hours.  Small monitors will be put on your body. They are used to check your heart, blood pressure, and oxygen level.  An IV access tube will be put into one of your veins. Medicine will be able to flow directly into your body through this IV tube.  You might be given a medicine to help you relax (sedative).  You will be given a medicine to make you sleep through the procedure (general anesthetic). A breathing tube may be placed into your lungs during the procedure.  A thin, flexible tube (catheter) will be placed into your bladder to collect urine.  A tube may be put in through your nose. It is called a nasogastric tube. It is used to remove stomach fluids after surgery until the intestines start working again.  Once you are asleep, the surgeon will make an incision in the abdomen.  Clamps or staples are put on both ends of the diseased part of the colon.  The part of the intestine between the clamps or staples is removed.  If possible, the ends of the healthy colon that remain will be stitched or stapled together to allow your body to expel waste (stool).  Sometimes, the remaining colon cannot be stitched back together. If this is the case, a colostomy is needed. For a colostomy:  An opening (stoma) to  the outside of your body is made through the abdomen.  The end of the colon is brought to the opening. It is stitched to the skin.  A bag is attached to the opening. Stool will drain into this bag. The bag is removable.  The colostomy can be temporary or permanent.  The incision from the colectomy will be closed with stitches or staples. AFTER THE PROCEDURE  You will stay in a recovery area until the anesthetic has worn off. Your blood pressure and pulse will be checked often. Then you will be taken to a hospital room.  You will continue to get fluids through the IV tube for a while. The IV tube will be taken out when the colon starts working again.  You will start on clear liquids and gradually go back to a normal diet.  You will be encouraged to cough and to take deep breaths  to open your lungs and prevent pneumonia.  Some pain is normal after a colectomy. You will be given pain medicine as needed.  You will be urged to get up and start walking within a day.  If you had a colostomy, your health care provider will explain how it works and what you will need to do.  You will likely need to stay in the hospital for 3 7 days. Document Released: 12/30/2008 Document Revised: 12/23/2012 Document Reviewed: 10/14/2012 [facility] Patient Information 2014 [employer]. PATIENT INSTRUCTIONS POST-ANESTHESIA  IMMEDIATELY FOLLOWING SURGERY:  Do not drive or operate machinery for the first twenty four hours after surgery.  Do not make any important decisions for twenty four hours after surgery or while taking narcotic pain medications or sedatives.  If you develop intractable nausea and vomiting or a severe headache please notify your doctor immediately.  FOLLOW-UP:  Please make an appointment with your surgeon as instructed. You do not need to follow up with anesthesia unless specifically instructed to do so.  WOUND CARE INSTRUCTIONS (if applicable):  Keep a dry clean dressing on the  anesthesia/puncture wound site if there is drainage.  Once the wound has quit draining you may leave it open to air.  Generally you should leave the bandage intact for twenty four hours unless there is drainage.  If the epidural site drains for more than 36-48 hours please call the anesthesia department.  QUESTIONS?:  Please feel free to call your physician or the hospital operator if you have any questions, and they will be happy to assist you.

## 2013-07-21 ENCOUNTER — Encounter (HOSPITAL_COMMUNITY)
Admission: RE | Admit: 2013-07-21 | Discharge: 2013-07-21 | Disposition: A | Discharge status: Home / Self Care | Payer: PRIVATE HEALTH INSURANCE | Source: Ambulatory Visit | Attending: General Surgery | Admitting: General Surgery

## 2013-07-21 LAB — PREPARE RBC (CROSSMATCH)

## 2013-07-21 LAB — ABO/RH: ABO/RH(D): A POS

## 2013-07-23 NOTE — OR Nursing (Signed)
Potasium 5.6 reported to Dr. Patsey Berthold and he is aware that patient has 3 units set up.

## 2013-07-26 ENCOUNTER — Encounter (HOSPITAL_COMMUNITY): Payer: PRIVATE HEALTH INSURANCE | Admitting: Anesthesiology

## 2013-07-26 ENCOUNTER — Encounter (HOSPITAL_COMMUNITY): Payer: Self-pay | Admitting: *Deleted

## 2013-07-26 ENCOUNTER — Inpatient Hospital Stay (HOSPITAL_COMMUNITY)
Admission: RE | Admit: 2013-07-26 | Discharge: 2013-08-02 | DRG: 333 | Disposition: A | Discharge status: Nursing Home (Includes ICF) | Payer: PRIVATE HEALTH INSURANCE | Source: Ambulatory Visit | Attending: General Surgery | Admitting: General Surgery

## 2013-07-26 ENCOUNTER — Inpatient Hospital Stay (HOSPITAL_COMMUNITY): Payer: PRIVATE HEALTH INSURANCE | Admitting: Anesthesiology

## 2013-07-26 ENCOUNTER — Encounter (HOSPITAL_COMMUNITY)
Admission: RE | Disposition: A | Discharge status: Nursing Home (Includes ICF) | Payer: Self-pay | Source: Ambulatory Visit | Attending: General Surgery

## 2013-07-26 DIAGNOSIS — M109 Gout, unspecified: Secondary | ICD-10-CM | POA: Diagnosis present

## 2013-07-26 DIAGNOSIS — J45909 Unspecified asthma, uncomplicated: Secondary | ICD-10-CM | POA: Diagnosis present

## 2013-07-26 DIAGNOSIS — F79 Unspecified intellectual disabilities: Secondary | ICD-10-CM | POA: Diagnosis present

## 2013-07-26 DIAGNOSIS — E875 Hyperkalemia: Secondary | ICD-10-CM | POA: Diagnosis present

## 2013-07-26 DIAGNOSIS — C7800 Secondary malignant neoplasm of unspecified lung: Secondary | ICD-10-CM

## 2013-07-26 DIAGNOSIS — D62 Acute posthemorrhagic anemia: Secondary | ICD-10-CM | POA: Diagnosis not present

## 2013-07-26 DIAGNOSIS — N183 Chronic kidney disease, stage 3 unspecified: Secondary | ICD-10-CM | POA: Diagnosis present

## 2013-07-26 DIAGNOSIS — Z794 Long term (current) use of insulin: Secondary | ICD-10-CM | POA: Diagnosis not present

## 2013-07-26 DIAGNOSIS — Z87891 Personal history of nicotine dependence: Secondary | ICD-10-CM

## 2013-07-26 DIAGNOSIS — N179 Acute kidney failure, unspecified: Secondary | ICD-10-CM | POA: Diagnosis present

## 2013-07-26 DIAGNOSIS — Z923 Personal history of irradiation: Secondary | ICD-10-CM

## 2013-07-26 DIAGNOSIS — E119 Type 2 diabetes mellitus without complications: Secondary | ICD-10-CM | POA: Diagnosis present

## 2013-07-26 DIAGNOSIS — I129 Hypertensive chronic kidney disease with stage 1 through stage 4 chronic kidney disease, or unspecified chronic kidney disease: Secondary | ICD-10-CM | POA: Diagnosis present

## 2013-07-26 DIAGNOSIS — G40909 Epilepsy, unspecified, not intractable, without status epilepticus: Secondary | ICD-10-CM | POA: Diagnosis present

## 2013-07-26 DIAGNOSIS — C20 Malignant neoplasm of rectum: Secondary | ICD-10-CM | POA: Diagnosis present

## 2013-07-26 HISTORY — PX: COLOSTOMY: SHX63

## 2013-07-26 HISTORY — PX: ABDOMINAL PERINEAL BOWEL RESECTION: SHX1111

## 2013-07-26 HISTORY — PX: SUPRACERVICAL ABDOMINAL HYSTERECTOMY: SHX5393

## 2013-07-26 HISTORY — PX: FLEXIBLE SIGMOIDOSCOPY: SHX5431

## 2013-07-26 HISTORY — PX: SALPINGOOPHORECTOMY: SHX82

## 2013-07-26 LAB — PREPARE RBC (CROSSMATCH)

## 2013-07-26 LAB — GLUCOSE, CAPILLARY
Glucose-Capillary: 101 mg/dL — ABNORMAL HIGH (ref 70–99)
Glucose-Capillary: 116 mg/dL — ABNORMAL HIGH (ref 70–99)
Glucose-Capillary: 130 mg/dL — ABNORMAL HIGH (ref 70–99)

## 2013-07-26 LAB — HEMOGLOBIN AND HEMATOCRIT, BLOOD
HCT: 29.2 % — ABNORMAL LOW (ref 36.0–46.0)
HEMOGLOBIN: 9.5 g/dL — AB (ref 12.0–15.0)

## 2013-07-26 SURGERY — SIGMOIDOSCOPY, FLEXIBLE
Anesthesia: General | Site: Rectum

## 2013-07-26 MED ORDER — GLYCOPYRROLATE 0.2 MG/ML IJ SOLN
INTRAMUSCULAR | Status: AC
  Filled 2013-07-26: qty 2

## 2013-07-26 MED ORDER — HYDROMORPHONE HCL PF 1 MG/ML IJ SOLN
1.0000 mg | INTRAMUSCULAR | Status: DC | PRN
  Administered 2013-07-26 – 2013-07-31 (×3): 1 mg via INTRAVENOUS
  Filled 2013-07-26 (×3): qty 1

## 2013-07-26 MED ORDER — KETOROLAC TROMETHAMINE 30 MG/ML IJ SOLN
30.0000 mg | Freq: Once | INTRAMUSCULAR | Status: AC
  Administered 2013-07-26: 30 mg via INTRAVENOUS
  Filled 2013-07-26: qty 1

## 2013-07-26 MED ORDER — PROPOFOL 10 MG/ML IV EMUL
INTRAVENOUS | Status: AC
  Filled 2013-07-26: qty 20

## 2013-07-26 MED ORDER — BUPIVACAINE LIPOSOME 1.3 % IJ SUSP
20.0000 mL | Freq: Once | INTRAMUSCULAR | Status: DC
  Filled 2013-07-26: qty 20

## 2013-07-26 MED ORDER — AMLODIPINE BESYLATE 5 MG PO TABS
2.5000 mg | ORAL_TABLET | Freq: Every day | ORAL | Status: DC
  Administered 2013-07-27 – 2013-08-02 (×7): 2.5 mg via ORAL
  Filled 2013-07-26 (×7): qty 1

## 2013-07-26 MED ORDER — FENTANYL CITRATE 0.05 MG/ML IJ SOLN
INTRAMUSCULAR | Status: AC
  Filled 2013-07-26: qty 2

## 2013-07-26 MED ORDER — POVIDONE-IODINE 10 % EX OINT
TOPICAL_OINTMENT | CUTANEOUS | Status: AC
  Filled 2013-07-26: qty 1

## 2013-07-26 MED ORDER — FENTANYL CITRATE 0.05 MG/ML IJ SOLN
25.0000 ug | INTRAMUSCULAR | Status: DC | PRN
  Filled 2013-07-26: qty 2

## 2013-07-26 MED ORDER — ENOXAPARIN SODIUM 40 MG/0.4ML ~~LOC~~ SOLN
40.0000 mg | Freq: Once | SUBCUTANEOUS | Status: AC
  Administered 2013-07-26: 40 mg via SUBCUTANEOUS

## 2013-07-26 MED ORDER — FENTANYL CITRATE 0.05 MG/ML IJ SOLN
INTRAMUSCULAR | Status: AC
  Filled 2013-07-26: qty 5

## 2013-07-26 MED ORDER — EPHEDRINE SULFATE 50 MG/ML IJ SOLN
INTRAMUSCULAR | Status: DC | PRN
  Administered 2013-07-26 (×3): 5 mg via INTRAVENOUS
  Administered 2013-07-26: 10 mg via INTRAVENOUS
  Administered 2013-07-26: 5 mg via INTRAVENOUS

## 2013-07-26 MED ORDER — PROPOFOL 10 MG/ML IV BOLUS
INTRAVENOUS | Status: DC | PRN
  Administered 2013-07-26: 150 mg via INTRAVENOUS

## 2013-07-26 MED ORDER — LACTATED RINGERS IV SOLN
INTRAVENOUS | Status: DC
  Administered 2013-07-26 – 2013-07-27 (×2): via INTRAVENOUS

## 2013-07-26 MED ORDER — OXYCODONE-ACETAMINOPHEN 5-325 MG PO TABS
1.0000 | ORAL_TABLET | ORAL | Status: DC | PRN
  Administered 2013-07-27 – 2013-08-02 (×11): 2 via ORAL
  Filled 2013-07-26 (×12): qty 2

## 2013-07-26 MED ORDER — ENOXAPARIN SODIUM 40 MG/0.4ML ~~LOC~~ SOLN
40.0000 mg | SUBCUTANEOUS | Status: DC
  Administered 2013-07-27 – 2013-08-02 (×7): 40 mg via SUBCUTANEOUS
  Filled 2013-07-26 (×7): qty 0.4

## 2013-07-26 MED ORDER — CHLORHEXIDINE GLUCONATE 4 % EX LIQD: 1.0000 "application " | Freq: Once | CUTANEOUS | Status: DC

## 2013-07-26 MED ORDER — SODIUM CHLORIDE BACTERIOSTATIC 0.9 % IJ SOLN
INTRAMUSCULAR | Status: AC
  Filled 2013-07-26: qty 10

## 2013-07-26 MED ORDER — ROCURONIUM BROMIDE 100 MG/10ML IV SOLN
INTRAVENOUS | Status: DC | PRN
  Administered 2013-07-26: 10 mg via INTRAVENOUS
  Administered 2013-07-26: 5 mg via INTRAVENOUS
  Administered 2013-07-26: 20 mg via INTRAVENOUS
  Administered 2013-07-26 (×3): 10 mg via INTRAVENOUS
  Administered 2013-07-26: 20 mg via INTRAVENOUS
  Administered 2013-07-26 (×5): 10 mg via INTRAVENOUS
  Administered 2013-07-26: 45 mg via INTRAVENOUS

## 2013-07-26 MED ORDER — SEVOFLURANE IN SOLN
RESPIRATORY_TRACT | Status: AC
  Filled 2013-07-26: qty 250

## 2013-07-26 MED ORDER — ENOXAPARIN SODIUM 40 MG/0.4ML ~~LOC~~ SOLN
SUBCUTANEOUS | Status: AC
  Filled 2013-07-26: qty 0.4

## 2013-07-26 MED ORDER — ONDANSETRON HCL 4 MG/2ML IJ SOLN: 4.0000 mg | Freq: Four times a day (QID) | INTRAMUSCULAR | Status: DC | PRN

## 2013-07-26 MED ORDER — ROCURONIUM BROMIDE 50 MG/5ML IV SOLN
INTRAVENOUS | Status: AC
  Filled 2013-07-26: qty 1

## 2013-07-26 MED ORDER — MIDAZOLAM HCL 2 MG/2ML IJ SOLN
INTRAMUSCULAR | Status: AC
  Filled 2013-07-26: qty 2

## 2013-07-26 MED ORDER — SODIUM CHLORIDE 0.9 % IV SOLN
1.0000 g | INTRAVENOUS | Status: AC
  Administered 2013-07-26: 1 g via INTRAVENOUS

## 2013-07-26 MED ORDER — ALVIMOPAN 12 MG PO CAPS
12.0000 mg | ORAL_CAPSULE | Freq: Once | ORAL | Status: AC
  Administered 2013-07-26: 12 mg via ORAL

## 2013-07-26 MED ORDER — SODIUM CHLORIDE 0.9 % IV SOLN
INTRAVENOUS | Status: DC | PRN
  Administered 2013-07-26: 13:00:00 via INTRAVENOUS

## 2013-07-26 MED ORDER — ONDANSETRON HCL 4 MG/2ML IJ SOLN: 4.0000 mg | Freq: Once | INTRAMUSCULAR | Status: DC | PRN

## 2013-07-26 MED ORDER — POVIDONE-IODINE 10 % EX OINT
TOPICAL_OINTMENT | CUTANEOUS | Status: DC | PRN
  Administered 2013-07-26: 1 via TOPICAL

## 2013-07-26 MED ORDER — FENTANYL CITRATE 0.05 MG/ML IJ SOLN
INTRAMUSCULAR | Status: DC | PRN
  Administered 2013-07-26: 100 ug via INTRAVENOUS
  Administered 2013-07-26 (×4): 50 ug via INTRAVENOUS
  Administered 2013-07-26: 25 ug via INTRAVENOUS
  Administered 2013-07-26 (×3): 50 ug via INTRAVENOUS
  Administered 2013-07-26: 25 ug via INTRAVENOUS
  Administered 2013-07-26 (×6): 50 ug via INTRAVENOUS

## 2013-07-26 MED ORDER — STERILE WATER FOR IRRIGATION IR SOLN
Status: DC | PRN
  Administered 2013-07-26: 1000 mL

## 2013-07-26 MED ORDER — HEMOSTATIC AGENTS (NO CHARGE) OPTIME
TOPICAL | Status: DC | PRN
  Administered 2013-07-26: 1

## 2013-07-26 MED ORDER — MICROFIBRILLAR COLL HEMOSTAT EX PADS
MEDICATED_PAD | CUTANEOUS | Status: DC | PRN
Start: 2013-07-26 — End: 2013-07-26

## 2013-07-26 MED ORDER — ONDANSETRON HCL 4 MG PO TABS: 4.0000 mg | ORAL_TABLET | Freq: Four times a day (QID) | ORAL | Status: DC | PRN

## 2013-07-26 MED ORDER — METOPROLOL SUCCINATE ER 50 MG PO TB24
100.0000 mg | ORAL_TABLET | Freq: Every day | ORAL | Status: DC
  Administered 2013-07-26 – 2013-08-01 (×7): 100 mg via ORAL
  Filled 2013-07-26 (×7): qty 2

## 2013-07-26 MED ORDER — HEMOSTATIC AGENTS (NO CHARGE) OPTIME
TOPICAL | Status: DC | PRN
  Administered 2013-07-26: 1 via TOPICAL

## 2013-07-26 MED ORDER — BUPIVACAINE LIPOSOME 1.3 % IJ SUSP
INTRAMUSCULAR | Status: DC | PRN
  Administered 2013-07-26: 12 mL

## 2013-07-26 MED ORDER — ONDANSETRON HCL 4 MG/2ML IJ SOLN
INTRAMUSCULAR | Status: AC
  Filled 2013-07-26: qty 2

## 2013-07-26 MED ORDER — ALVIMOPAN 12 MG PO CAPS
12.0000 mg | ORAL_CAPSULE | Freq: Two times a day (BID) | ORAL | Status: DC
  Administered 2013-07-27 – 2013-07-28 (×3): 12 mg via ORAL
  Filled 2013-07-26 (×3): qty 1

## 2013-07-26 MED ORDER — PHENYTOIN SODIUM EXTENDED 100 MG PO CAPS
100.0000 mg | ORAL_CAPSULE | Freq: Two times a day (BID) | ORAL | Status: DC
  Administered 2013-07-26 – 2013-08-02 (×14): 100 mg via ORAL
  Filled 2013-07-26 (×14): qty 1

## 2013-07-26 MED ORDER — INSULIN ASPART 100 UNIT/ML ~~LOC~~ SOLN
0.0000 [IU] | Freq: Three times a day (TID) | SUBCUTANEOUS | Status: DC
  Administered 2013-07-27: 4 [IU] via SUBCUTANEOUS
  Administered 2013-07-27: 3 [IU] via SUBCUTANEOUS
  Administered 2013-07-27 – 2013-07-30 (×6): 4 [IU] via SUBCUTANEOUS
  Administered 2013-07-30: 3 [IU] via SUBCUTANEOUS
  Administered 2013-07-30 – 2013-07-31 (×2): 7 [IU] via SUBCUTANEOUS
  Administered 2013-07-31 – 2013-08-01 (×3): 4 [IU] via SUBCUTANEOUS
  Administered 2013-08-02: 3 [IU] via SUBCUTANEOUS

## 2013-07-26 MED ORDER — LIDOCAINE HCL (CARDIAC) 10 MG/ML IV SOLN
INTRAVENOUS | Status: DC | PRN
  Administered 2013-07-26: 10 mg via INTRAVENOUS

## 2013-07-26 MED ORDER — SODIUM CHLORIDE 0.9 % IV SOLN
INTRAVENOUS | Status: AC
  Filled 2013-07-26: qty 1

## 2013-07-26 MED ORDER — PHENYLEPHRINE HCL 10 MG/ML IJ SOLN
INTRAMUSCULAR | Status: AC
  Filled 2013-07-26: qty 1

## 2013-07-26 MED ORDER — STERILE WATER FOR IRRIGATION IR SOLN
Status: DC | PRN
  Administered 2013-07-26: 10:00:00

## 2013-07-26 MED ORDER — ALVIMOPAN 12 MG PO CAPS
ORAL_CAPSULE | ORAL | Status: AC
  Filled 2013-07-26: qty 1

## 2013-07-26 MED ORDER — LORAZEPAM 2 MG/ML IJ SOLN
1.0000 mg | INTRAMUSCULAR | Status: DC | PRN
  Administered 2013-07-27: 1 mg via INTRAVENOUS
  Filled 2013-07-26: qty 1

## 2013-07-26 MED ORDER — LIDOCAINE HCL (PF) 1 % IJ SOLN
INTRAMUSCULAR | Status: AC
Start: 2013-07-26 — End: 2013-07-26
  Filled 2013-07-26: qty 5

## 2013-07-26 MED ORDER — ONDANSETRON HCL 4 MG/2ML IJ SOLN
4.0000 mg | Freq: Once | INTRAMUSCULAR | Status: AC
  Administered 2013-07-26: 4 mg via INTRAVENOUS

## 2013-07-26 MED ORDER — MOMETASONE FURO-FORMOTEROL FUM 100-5 MCG/ACT IN AERO
2.0000 | INHALATION_SPRAY | Freq: Two times a day (BID) | RESPIRATORY_TRACT | Status: DC
  Administered 2013-07-26 – 2013-08-02 (×13): 2 via RESPIRATORY_TRACT
  Filled 2013-07-26: qty 8.8

## 2013-07-26 MED ORDER — 0.9 % SODIUM CHLORIDE (POUR BTL) OPTIME
TOPICAL | Status: DC | PRN
  Administered 2013-07-26 (×3): 1000 mL

## 2013-07-26 MED ORDER — MIDAZOLAM HCL 2 MG/2ML IJ SOLN
1.0000 mg | INTRAMUSCULAR | Status: DC | PRN
  Administered 2013-07-26: 2 mg via INTRAVENOUS

## 2013-07-26 MED ORDER — FENTANYL CITRATE 0.05 MG/ML IJ SOLN
25.0000 ug | INTRAMUSCULAR | Status: DC | PRN
  Administered 2013-07-26: 25 ug via INTRAVENOUS

## 2013-07-26 MED ORDER — NEOSTIGMINE METHYLSULFATE 10 MG/10ML IV SOLN
INTRAVENOUS | Status: DC | PRN
  Administered 2013-07-26: 2 mg via INTRAVENOUS

## 2013-07-26 MED ORDER — LACTATED RINGERS IV SOLN
INTRAVENOUS | Status: DC
Start: 2013-07-26 — End: 2013-07-26
  Administered 2013-07-26 (×6): via INTRAVENOUS

## 2013-07-26 MED ORDER — EPHEDRINE SULFATE 50 MG/ML IJ SOLN
INTRAMUSCULAR | Status: AC
  Filled 2013-07-26: qty 1

## 2013-07-26 MED ORDER — GLYCOPYRROLATE 0.2 MG/ML IJ SOLN
INTRAMUSCULAR | Status: DC | PRN
  Administered 2013-07-26: 0.4 mg via INTRAVENOUS

## 2013-07-26 SURGICAL SUPPLY — 91 items
APPLIER CLIP 13 LRG OPEN (CLIP) ×7
APR CLP LRG 13 20 CLIP (CLIP) ×5
BAG HAMPER (MISCELLANEOUS) ×7 IMPLANT
BARRIER SKIN 2 3/4 (OSTOMY) ×6 IMPLANT
BARRIER SKIN 2 3/4 INCH (OSTOMY) ×1
BARRIER SKIN OD2.25 2 3/4 FLNG (OSTOMY) IMPLANT
BLADE 10 SAFETY STRL DISP (BLADE) ×14 IMPLANT
BLADE 15 SAFETY STRL DISP (BLADE) ×7 IMPLANT
BLADE HEX COATED 2.75 (ELECTRODE) ×7 IMPLANT
BLADE SURG SZ10 CARB STEEL (BLADE) ×3 IMPLANT
BRR SKN FLT 2.75X2.25 2 PC (OSTOMY) ×5
CHLORAPREP W/TINT 26ML (MISCELLANEOUS) ×7 IMPLANT
CLAMP POUCH DRAINAGE QUIET (OSTOMY) ×3 IMPLANT
CLIP APPLIE 13 LRG OPEN (CLIP) ×5 IMPLANT
CLOTH BEACON ORANGE TIMEOUT ST (SAFETY) ×7 IMPLANT
COVER LIGHT HANDLE  1/PK (MISCELLANEOUS) ×4
COVER LIGHT HANDLE 1/PK (MISCELLANEOUS) ×2 IMPLANT
COVER LIGHT HANDLE STERIS (MISCELLANEOUS) ×8 IMPLANT
COVER MAYO STAND XLG (DRAPE) ×10 IMPLANT
DRAPE PROXIMA HALF (DRAPES) ×3 IMPLANT
DRAPE WARM FLUID 44X44 (DRAPE) ×7 IMPLANT
DRSG OPSITE POSTOP 4X10 (GAUZE/BANDAGES/DRESSINGS) ×7 IMPLANT
ELECT BLADE 6 FLAT ULTRCLN (ELECTRODE) ×10 IMPLANT
ELECT REM PT RETURN 9FT ADLT (ELECTROSURGICAL) ×14
ELECTRODE REM PT RTRN 9FT ADLT (ELECTROSURGICAL) ×10 IMPLANT
EVACUATOR DRAINAGE 10X20 100CC (DRAIN) ×5 IMPLANT
EVACUATOR SILICONE 100CC (DRAIN) ×7
GAUZE SPONGE 4X4 16PLY XRAY LF (GAUZE/BANDAGES/DRESSINGS) ×14 IMPLANT
GLOVE BIOGEL PI IND STRL 7.0 (GLOVE) ×6 IMPLANT
GLOVE BIOGEL PI IND STRL 9 (GLOVE) ×1 IMPLANT
GLOVE BIOGEL PI INDICATOR 7.0 (GLOVE) ×12
GLOVE BIOGEL PI INDICATOR 9 (GLOVE) ×2
GLOVE ECLIPSE 6.5 STRL STRAW (GLOVE) ×6 IMPLANT
GLOVE ECLIPSE 7.0 STRL STRAW (GLOVE) ×6 IMPLANT
GLOVE ECLIPSE 7.5 STRL STRAW (GLOVE) ×3 IMPLANT
GLOVE ECLIPSE 9.0 STRL (GLOVE) ×3 IMPLANT
GLOVE EXAM NITRILE LRG STRL (GLOVE) ×6 IMPLANT
GLOVE OPTIFIT SS 6.5 STRL BRWN (GLOVE) ×6 IMPLANT
GLOVE SS BIOGEL STRL SZ 6.5 (GLOVE) ×1 IMPLANT
GLOVE SUPERSENSE BIOGEL SZ 6.5 (GLOVE) ×2
GLOVE SURG SS PI 7.5 STRL IVOR (GLOVE) ×21 IMPLANT
GOWN SPEC L3 XXLG W/TWL (GOWN DISPOSABLE) ×3 IMPLANT
GOWN STRL REUS W/ TWL LRG LVL3 (GOWN DISPOSABLE) ×1 IMPLANT
GOWN STRL REUS W/TWL LRG LVL3 (GOWN DISPOSABLE) ×37 IMPLANT
HEMOSTAT SURGICEL 4X8 (HEMOSTASIS) ×6 IMPLANT
INST SET MAJOR GENERAL (KITS) ×7 IMPLANT
KIT CLEAN ENDO COMPLIANCE (KITS) ×3 IMPLANT
KIT ROOM TURNOVER APOR (KITS) ×7 IMPLANT
LIGASURE IMPACT 36 18CM CVD LR (INSTRUMENTS) ×10 IMPLANT
LUBRICANT JELLY 4.5OZ STERILE (MISCELLANEOUS) ×3 IMPLANT
MANIFOLD NEPTUNE II (INSTRUMENTS) ×7 IMPLANT
NDL HYPO 18GX1.5 BLUNT FILL (NEEDLE) IMPLANT
NDL HYPO 25X1 1.5 SAFETY (NEEDLE) IMPLANT
NEEDLE HYPO 18GX1.5 BLUNT FILL (NEEDLE) ×7 IMPLANT
NEEDLE HYPO 25X1 1.5 SAFETY (NEEDLE) ×7 IMPLANT
NS IRRIG 1000ML POUR BTL (IV SOLUTION) ×17 IMPLANT
PACK PERI GYN (CUSTOM PROCEDURE TRAY) ×7 IMPLANT
PAD ABD 5X9 TENDERSORB (GAUZE/BANDAGES/DRESSINGS) ×7 IMPLANT
PAD ARMBOARD 7.5X6 YLW CONV (MISCELLANEOUS) ×7 IMPLANT
PENCIL HANDSWITCHING (ELECTRODE) ×17 IMPLANT
RETRACTOR WND ALEXIS 25 LRG (MISCELLANEOUS) ×1 IMPLANT
RTRCTR WOUND ALEXIS 25CM LRG (MISCELLANEOUS) ×7
SET BASIN LINEN APH (SET/KITS/TRAYS/PACK) ×7 IMPLANT
SPONGE DRAIN TRACH 4X4 STRL 2S (GAUZE/BANDAGES/DRESSINGS) ×7 IMPLANT
SPONGE GAUZE 4X4 12PLY (GAUZE/BANDAGES/DRESSINGS) ×7 IMPLANT
SPONGE LAP 18X18 X RAY DECT (DISPOSABLE) ×30 IMPLANT
SPONGE SURGIFOAM ABS GEL 100 (HEMOSTASIS) ×3 IMPLANT
STAPLER PROXIMATE 75MM BLUE (STAPLE) ×3 IMPLANT
STAPLER RELOADABLE 65 2-0 SUT (MISCELLANEOUS) IMPLANT
STAPLER SYS INTERNAL RELOAD SS (MISCELLANEOUS) IMPLANT
STAPLER VISISTAT (STAPLE) ×7 IMPLANT
SUCTION POOLE TIP (SUCTIONS) ×7 IMPLANT
SUT CHROMIC 0 CT 1 (SUTURE) ×36 IMPLANT
SUT CHROMIC 2 0 SH (SUTURE) ×6 IMPLANT
SUT CHROMIC 3 0 PS 2 (SUTURE) ×12 IMPLANT
SUT ETHILON 3 0 FSL (SUTURE) ×7 IMPLANT
SUT NOVA NAB GS-26 0 60 (SUTURE) ×14 IMPLANT
SUT PROLENE 2 0 FS (SUTURE) ×15 IMPLANT
SUT SILK 2 0 REEL (SUTURE) ×7 IMPLANT
SUT SILK 3 0 SH CR/8 (SUTURE) ×11 IMPLANT
SUT VIC AB 3-0 SH 27 (SUTURE) ×21
SUT VIC AB 3-0 SH 27X BRD (SUTURE) ×3 IMPLANT
SYR 20CC LL (SYRINGE) ×3 IMPLANT
SYR BULB IRRIGATION 50ML (SYRINGE) ×7 IMPLANT
TAPE CLOTH SURG 4X10 WHT LF (GAUZE/BANDAGES/DRESSINGS) ×3 IMPLANT
TOWEL BLUE STERILE X RAY DET (MISCELLANEOUS) ×7 IMPLANT
TRAY FOLEY CATH 16FR SILVER (SET/KITS/TRAYS/PACK) ×7 IMPLANT
TUBING ENDO SMARTCAP PENTAX (MISCELLANEOUS) ×3 IMPLANT
TUBING IRRIGATION ENDOGATOR (MISCELLANEOUS) ×3 IMPLANT
WATER STERILE IRR 1000ML POUR (IV SOLUTION) ×9 IMPLANT
YANKAUER SUCT BULB TIP 10FT TU (MISCELLANEOUS) ×14 IMPLANT

## 2013-07-26 NOTE — Progress Notes (Signed)
From OR. Arousing. Moaning/groaning. Oriented to place per nurse. Reassurance given. Small amt red drainage from rectal area. Rectal area cleansed and peri pads applied. Sutures present to rectal area. Pain med per anesthesia.

## 2013-07-26 NOTE — Op Note (Signed)
Patient:  [name]  DOB:  12-Oct-1950  MRN:  [id number]   Preop Diagnosis:  Rectal carcinoma, fibroid uterus  Postop Diagnosis:  Same  Procedure:  Flexible sigmoidoscopy, abdominoperineal resection, total abdominal hysterectomy with bilateral salpingo-oophorectomy  Surgeon:  Aviva Signs, M.D.  Anes:  General endotracheal  Indications:  Patient is a 63 year old black female ward of the state who has a known history of a rectal cancer. She underwent neoadjuvant radiation therapy and now presents for surgery. I will attempt a low anterior resection but the patient has been fully and instructed that she may need an abdominoperineal resection with permanent colostomy. She does have a history of a fibroid uterus, though she states she had a hysterectomy in the past. The risks and benefits of the procedure including bleeding, infection, cardiopulmonary difficulties, the possibility of a permanent colostomy, and a possibly of a blood transfusion were fully explained to the patient and her power of attorney, who gave informed consent.  Procedure note:  The patient was placed in the lithotomy position. After induction of general endotracheal anesthesia, a flexible sigmoidoscopy was performed which revealed irregular mucosa at approximately 5-6 cm from the anus. This was intermittently circumferential in nature.  It did extend up to approximately the 9 cm Jaceon Heiberger. Next, the abdomen and perineum were prepped and draped using usual sterile technique with DuraPrep and Betadine. Surgical site confirmation was performed.   An incision was made through a previous lower midline surgical scar. The peritoneal cavity was entered into without difficulty. The patient had a large redundant sigmoid colon. Lab into the pelvis, the patient had a very large abnormal by cuspid uterus with the large irregular fibroids present. The liver is within normal limits. I elected to proceed with an intraoperative consultation by  Dr. Mallory Shirk of gynecology. He will dictate the procedure that I assisted him with. The power of attorney and family were made aware of the need for a hysterectomy with bilateral salpingo-oophorectomy.  After the hysterectomy was performed, the rectum from the mid sigmoid colon was mobilized along its peritoneal reflection. Care was taken to avoid the ureters. A GIA 70 stapler was placed across the sigmoid colon and fired. The rectal mesentery was then divided using the LigaSure. The rectal artery and vein branch of the inferior mesenteric artery was ligated using 2-0 silk ties x2. The peritoneal reflection around the anterior aspect of the rectum was divided. Despite extensive dissection, I did not feel that can safely do a low anterior resection with adequate margins. I then proceeded to do an abdominoperineal resection. An elliptical incision was made around the anus. The anus was then freed away from the ureter and eye muscles and pelvic brim using Bovie electrocautery. Care was taken to avoid entrance into the vagina. The rectum was then delivered through the perineum in total without difficulty. It was sent to pathology further examination. The perineum was irrigated normal saline. The pelvic muscles were reapproximated using 0 Vicryl sutures. This was done in 2 layers. The subcutaneous layer was reapproximated using 2-0 Vicryl interrupted sutures. The skin was closed using 2-0 Prolene sutures.  Next, operating personnel changed their gowns and gloves. The pelvis was copiously irrigated normal saline. Any bleeding was controlled using Bovie electrocautery. The vaginal cuff was inspected and any bleeding was controlled using Bovie electrode. A #10 flat Jackson-Pratt drain was placed into the pelvis and brought through separate stab wound to the right of the incision. It was secured at the skin level using 3-0  nylon suture. Surgicel and Gelfoam were then placed into the pelvis. The perineum was then  closed over the pelvis using a running 0 chromic gut suture. A colostomy was then formed to the left of the umbilicus. A single suture was placed at the fascial level to secure the colon to the abdominal wall. The fascia of the lower midline incision was then reapproximated using a looped oh Novafil running suture. The subcutaneous layer was irrigated normal saline and the skin was closed using staples. When this incision was closed, the colostomy was then matured using 3-0 chromic gut interrupted sutures. An ostomy bag was then placed.  All tape and needle counts were correct at the end of the procedure. The patient was extubated in the operating room and transferred to PACU in stable condition.    Complications:  None  EBL:  600 cc  Specimen:  Uterus with ovaries, rectum  Drains: JP drain to pelvis

## 2013-07-26 NOTE — Anesthesia Postprocedure Evaluation (Signed)
  Anesthesia Post-op Note  Patient: Lilou Kneip  Procedure(s) Performed: Procedure(s): FLEXIBLE SIGMOIDOSCOPY (N/A)  ABDOMINAL PERINEAL RESECTION (N/A) HYSTERECTOMY SUPRACERVICAL ABDOMINAL  (N/A) SALPINGO OOPHORECTOMY (Bilateral)  Patient Location: PACU  Anesthesia Type:General  Level of Consciousness: sedated and patient cooperative  Airway and Oxygen Therapy: Patient Spontanous Breathing and non-rebreather face mask  Post-op Pain: moderate  Post-op Assessment: Post-op Vital signs reviewed, Patient's Cardiovascular Status Stable, Respiratory Function Stable and Patent Airway  Post-op Vital Signs: Reviewed and stable  Last Vitals:  Filed Vitals:   07/26/13 1439  BP: 116/62  Pulse: 86  Temp: 36.5 C  Resp: 24    Complications: No apparent anesthesia complications

## 2013-07-26 NOTE — Progress Notes (Signed)
Hgb 9.5, Hct 29.2 results called to Dr Arnoldo Morale. No new orders given.

## 2013-07-26 NOTE — Transfer of Care (Signed)
Immediate Anesthesia Transfer of Care Note  Patient: Abiageal Blowe  Procedure(s) Performed: Procedure(s): FLEXIBLE SIGMOIDOSCOPY (N/A)  ABDOMINAL PERINEAL RESECTION (N/A) HYSTERECTOMY SUPRACERVICAL ABDOMINAL  (N/A) SALPINGO OOPHORECTOMY (Bilateral)  Patient Location: PACU  Anesthesia Type:General  Level of Consciousness: sedated and patient cooperative  Airway & Oxygen Therapy: Patient Spontanous Breathing and non-rebreather face mask  Post-op Assessment: Report given to PACU RN, Post -op Vital signs reviewed and stable and Patient moving all extremities  Post vital signs: Reviewed and stable  Complications: No apparent anesthesia complications

## 2013-07-26 NOTE — Anesthesia Preprocedure Evaluation (Signed)
Anesthesia Evaluation  Patient identified by MRN, date of birth, ID band Patient awake    Reviewed: Allergy & Precautions, H&P , NPO status , Patient's Chart, lab work & pertinent test results  Airway Mallampati: I TM Distance: >3 FB Neck ROM: Full    Dental  (+) Edentulous Upper, Edentulous Lower   Pulmonary shortness of breath, asthma , former smoker,    Pulmonary exam normal       Cardiovascular hypertension, Pt. on medications Rhythm:Regular     Neuro/Psych Seizures -, Well Controlled,  PSYCHIATRIC DISORDERS Depression Mental retardation    GI/Hepatic   Endo/Other  diabetes, Poorly Controlled, Type 2, Insulin Dependent, Oral Hypoglycemic Agents  Renal/GU      Musculoskeletal   Abdominal   Peds  Hematology   Anesthesia Other Findings   Reproductive/Obstetrics                           Anesthesia Physical Anesthesia Plan  ASA: III  Anesthesia Plan: General   Post-op Pain Management:    Induction: Intravenous  Airway Management Planned: Oral ETT  Additional Equipment:   Intra-op Plan:   Post-operative Plan: Extubation in OR  Informed Consent: I have reviewed the patients History and Physical, chart, labs and discussed the procedure including the risks, benefits and alternatives for the proposed anesthesia with the patient or authorized representative who has indicated his/her understanding and acceptance.     Plan Discussed with:   Anesthesia Plan Comments:         Anesthesia Quick Evaluation

## 2013-07-26 NOTE — Anesthesia Procedure Notes (Signed)
Procedure Name: Intubation Date/Time: 07/26/2013 9:43 AM Performed by: Vista Deck Pre-anesthesia Checklist: Patient identified, Patient being monitored, Timeout performed, Emergency Drugs available and Suction available Patient Re-evaluated:Patient Re-evaluated prior to inductionOxygen Delivery Method: Circle System Utilized Preoxygenation: Pre-oxygenation with 100% oxygen Intubation Type: IV induction Ventilation: Mask ventilation without difficulty Laryngoscope Size: Miller and 2 Grade View: Grade I Tube type: Oral Tube size: 7.0 mm Number of attempts: 1 Airway Equipment and Method: stylet Placement Confirmation: ETT inserted through vocal cords under direct vision,  positive ETCO2 and breath sounds checked- equal and bilateral Secured at: 21 cm Tube secured with: Tape Dental Injury: Teeth and Oropharynx as per pre-operative assessment

## 2013-07-26 NOTE — Interval H&P Note (Signed)
History and Physical Interval Note:  07/26/2013 8:54 AM  [name]  has presented today for surgery, with the diagnosis of malignant neoplasm of rectum  The various methods of treatment have been discussed with the patient and family. After consideration of risks, benefits and other options for treatment, the patient has consented to  Procedure(s): LOW ANTERIOR BOWEL RESECTION (N/A) POSSIBLE ABDOMINAL PERINEAL RESECTION (N/A) POSSIBLE FLEXIBLE SIGMOIDOSCOPY (N/A) as a surgical intervention .  The patient's history has been reviewed, patient examined, no change in status, stable for surgery.  I have reviewed the patient's chart and labs.  Questions were answered to the patient's satisfaction.     Jamesetta So

## 2013-07-27 ENCOUNTER — Encounter (HOSPITAL_COMMUNITY): Payer: PRIVATE HEALTH INSURANCE

## 2013-07-27 ENCOUNTER — Encounter (HOSPITAL_COMMUNITY): Payer: Self-pay | Admitting: General Surgery

## 2013-07-27 ENCOUNTER — Inpatient Hospital Stay (HOSPITAL_COMMUNITY): Payer: PRIVATE HEALTH INSURANCE

## 2013-07-27 DIAGNOSIS — C20 Malignant neoplasm of rectum: Secondary | ICD-10-CM | POA: Diagnosis not present

## 2013-07-27 LAB — GLUCOSE, CAPILLARY
GLUCOSE-CAPILLARY: 200 mg/dL — AB (ref 70–99)
Glucose-Capillary: 157 mg/dL — ABNORMAL HIGH (ref 70–99)
Glucose-Capillary: 189 mg/dL — ABNORMAL HIGH (ref 70–99)
Glucose-Capillary: 184 mg/dL — ABNORMAL HIGH (ref 70–99)

## 2013-07-27 LAB — URINE MICROSCOPIC-ADD ON

## 2013-07-27 LAB — BASIC METABOLIC PANEL
BUN: 34 mg/dL — AB (ref 6–23)
BUN: 39 mg/dL — AB (ref 6–23)
CHLORIDE: 100 meq/L (ref 96–112)
CO2: 23 mEq/L (ref 19–32)
CO2: 24 meq/L (ref 19–32)
Calcium: 7.6 mg/dL — ABNORMAL LOW (ref 8.4–10.5)
Calcium: 7.3 mg/dL — ABNORMAL LOW (ref 8.4–10.5)
Chloride: 102 mEq/L (ref 96–112)
Creatinine, Ser: 1.91 mg/dL — ABNORMAL HIGH (ref 0.50–1.10)
Creatinine, Ser: 2.21 mg/dL — ABNORMAL HIGH (ref 0.50–1.10)
GFR calc Af Amer: 26 mL/min — ABNORMAL LOW (ref >90)
GFR calc non Af Amer: 23 mL/min — ABNORMAL LOW (ref >90)
GFR, EST AFRICAN AMERICAN: 31 mL/min — AB (ref >90)
GFR, EST NON AFRICAN AMERICAN: 27 mL/min — AB (ref >90)
GLUCOSE: 162 mg/dL — AB (ref 70–99)
Glucose, Bld: 205 mg/dL — ABNORMAL HIGH (ref 70–99)
POTASSIUM: 6.9 meq/L — AB (ref 3.7–5.3)
Potassium: 5.6 mEq/L — ABNORMAL HIGH (ref 3.7–5.3)
Sodium: 137 mEq/L (ref 137–147)
Sodium: 136 mEq/L — ABNORMAL LOW (ref 137–147)

## 2013-07-27 LAB — CBC
HEMATOCRIT: 29.6 % — AB (ref 36.0–46.0)
Hemoglobin: 9.5 g/dL — ABNORMAL LOW (ref 12.0–15.0)
MCH: 28.6 pg (ref 26.0–34.0)
MCHC: 32.1 g/dL (ref 30.0–36.0)
MCV: 89.2 fL (ref 78.0–100.0)
PLATELETS: 240 10*3/uL (ref 150–400)
RBC: 3.32 MIL/uL — ABNORMAL LOW (ref 3.87–5.11)
RDW: 13.6 % (ref 11.5–15.5)
WBC: 15.1 10*3/uL — AB (ref 4.0–10.5)

## 2013-07-27 LAB — PHOSPHORUS: PHOSPHORUS: 6.1 mg/dL — AB (ref 2.3–4.6)

## 2013-07-27 LAB — HEMOGLOBIN A1C
HEMOGLOBIN A1C: 6 % — AB (ref <5.7)
Mean Plasma Glucose: 126 mg/dL — ABNORMAL HIGH (ref <117)

## 2013-07-27 LAB — URINALYSIS, ROUTINE W REFLEX MICROSCOPIC
BILIRUBIN URINE: NEGATIVE
GLUCOSE, UA: NEGATIVE mg/dL
Ketones, ur: NEGATIVE mg/dL
Nitrite: NEGATIVE
Protein, ur: 100 mg/dL — AB
Urobilinogen, UA: 0.2 mg/dL (ref 0.0–1.0)
pH: 5.5 (ref 5.0–8.0)

## 2013-07-27 LAB — POTASSIUM: Potassium: 6.7 mEq/L (ref 3.7–5.3)

## 2013-07-27 LAB — POCT I-STAT 4, (NA,K, GLUC, HGB,HCT)
Glucose, Bld: 82 mg/dL (ref 70–99)
HCT: 34 % — ABNORMAL LOW (ref 36.0–46.0)
HEMOGLOBIN: 11.6 g/dL — AB (ref 12.0–15.0)
Potassium: 4.5 mEq/L (ref 3.7–5.3)
Sodium: 139 mEq/L (ref 137–147)

## 2013-07-27 LAB — MAGNESIUM: Magnesium: 1 mg/dL — ABNORMAL LOW (ref 1.5–2.5)

## 2013-07-27 LAB — CREATININE, URINE, RANDOM: CREATININE, URINE: 131.44 mg/dL

## 2013-07-27 LAB — SODIUM, URINE, RANDOM: SODIUM UR: 38 meq/L

## 2013-07-27 MED ORDER — DEXTROSE 50 % IV SOLN
1.0000 | Freq: Once | INTRAVENOUS | Status: AC
  Administered 2013-07-27: 50 mL via INTRAVENOUS

## 2013-07-27 MED ORDER — MAGNESIUM SULFATE 4000MG/100ML IJ SOLN
4.0000 g | Freq: Once | INTRAMUSCULAR | Status: AC
  Administered 2013-07-27: 4 g via INTRAVENOUS
  Filled 2013-07-27: qty 100

## 2013-07-27 MED ORDER — SODIUM POLYSTYRENE SULFONATE 15 GM/60ML PO SUSP
30.0000 g | Freq: Once | ORAL | Status: AC
Start: 2013-07-27 — End: 2013-07-27
  Administered 2013-07-27: 30 g via ORAL
  Filled 2013-07-27: qty 120

## 2013-07-27 MED ORDER — SODIUM CHLORIDE 0.9 % IJ SOLN
10.0000 mL | INTRAMUSCULAR | Status: DC | PRN
  Administered 2013-07-27: 30 mL

## 2013-07-27 MED ORDER — DEXTROSE 50 % IV SOLN
INTRAVENOUS | Status: AC
  Administered 2013-07-27: 50 mL via INTRAVENOUS
  Filled 2013-07-27: qty 50

## 2013-07-27 MED ORDER — SODIUM CHLORIDE 0.9 % IJ SOLN
10.0000 mL | Freq: Two times a day (BID) | INTRAMUSCULAR | Status: DC
  Administered 2013-07-27: 10 mL
  Administered 2013-07-28: 20 mL
  Administered 2013-07-28 – 2013-08-02 (×9): 10 mL

## 2013-07-27 MED ORDER — SODIUM CHLORIDE 0.9 % IV SOLN: 1.0000 mg/kg/h | Freq: Once | INTRAVENOUS | Status: DC

## 2013-07-27 MED ORDER — SODIUM CHLORIDE 0.9 % IV SOLN
INTRAVENOUS | Status: DC
Start: 2013-07-27 — End: 2013-07-30
  Administered 2013-07-27 – 2013-07-29 (×7): via INTRAVENOUS

## 2013-07-27 MED ORDER — SODIUM CHLORIDE 0.9 % IV SOLN: 1.0000 g | Freq: Once | INTRAVENOUS | Status: DC

## 2013-07-27 MED ORDER — FUROSEMIDE 10 MG/ML IJ SOLN
40.0000 mg | Freq: Once | INTRAMUSCULAR | Status: AC
  Administered 2013-07-27: 40 mg via INTRAVENOUS
  Filled 2013-07-27: qty 4

## 2013-07-27 MED ORDER — FUROSEMIDE 10 MG/ML IJ SOLN
40.0000 mg | Freq: Two times a day (BID) | INTRAMUSCULAR | Status: DC
  Administered 2013-07-27 – 2013-07-28 (×3): 40 mg via INTRAVENOUS
  Filled 2013-07-27 (×3): qty 4

## 2013-07-27 MED ORDER — SODIUM CHLORIDE 0.9 % IV BOLUS (SEPSIS)
500.0000 mL | Freq: Once | INTRAVENOUS | Status: AC
  Administered 2013-07-27: 500 mL via INTRAVENOUS

## 2013-07-27 MED ORDER — SODIUM CHLORIDE 0.9 % IV SOLN
1.0000 g | Freq: Once | INTRAVENOUS | Status: AC
  Administered 2013-07-27: 1 g via INTRAVENOUS
  Filled 2013-07-27: qty 10

## 2013-07-27 MED ORDER — INSULIN ASPART 100 UNIT/ML ~~LOC~~ SOLN
10.0000 [IU] | Freq: Once | SUBCUTANEOUS | Status: AC
  Administered 2013-07-27: 10 [IU] via SUBCUTANEOUS

## 2013-07-27 NOTE — Progress Notes (Signed)
Peripherally Inserted Central Catheter/Midline Placement  The IV Nurse has discussed with the patient and/or persons authorized to consent for the patient, the purpose of this procedure and the potential benefits and risks involved with this procedure.  The benefits include less needle sticks, lab draws from the catheter and patient may be discharged home with the catheter.  Risks include, but not limited to, infection, bleeding, blood clot (thrombus formation), and puncture of an artery; nerve damage and irregular heat beat.  Alternatives to this procedure were also discussed.Neuro assessment done prior to consent to verify mental stability.  PICC/Midline Placement Documentation  PICC / Midline Double Lumen 07/27/13 PICC Right Brachial 40 cm 0 cm (Active)  Indication for Insertion or Continuance of Line Limited venous access - need for IV therapy >5 days (PICC only) 07/27/2013 11:08 AM  Exposed Catheter (cm) 0 cm 07/27/2013 11:08 AM  Site Assessment Clean;Dry;Intact 07/27/2013 11:08 AM  Lumen #1 Status Flushed;Saline locked;Blood return noted 07/27/2013 11:08 AM  Lumen #2 Status Saline locked;Flushed;Blood return noted 07/27/2013 11:08 AM  Dressing Type Transparent;Securing device 07/27/2013 11:08 AM  Dressing Status Clean;Dry;Intact;Antimicrobial disc in place 07/27/2013 11:08 AM  Line Care Connections checked and tightened 07/27/2013 11:08 AM  Dressing Intervention New dressing 07/27/2013 11:08 AM  Dressing Change Due 08/03/13 07/27/2013 11:08 AM       [name] [name] 07/27/2013, 11:16 AM

## 2013-07-27 NOTE — Consult Note (Signed)
Reason for Consult: Renal failure and hyperkalemia Referring Physician: Dr. Davonna Belling is an 63 y.o. female.  HPI: She is a patient was history of diabetes, hypertension, mental retardation presently was brought to the hospital surgery of rectal cancer. Presently patient is status post abdominal perineal resection and hysterectomy. Because of patient's mental retardation very difficult to get any additional history. Patient has this moment does not remember having problem with her kidneys. She denies any difficulty breathing, orthopnea, nausea or vomiting. Presently her potassium is high and also patient has possibly acute renal failure. At this moment I don't have her medication list hence not sure whether patient was taking any medication which can affect her kidneys.  Past Medical History  Diagnosis Date  . Hypertension   . Diabetes mellitus     years  . Asthma   . Depression   . Mental retardation   . History of recurrent UTIs   . Shortness of breath     with exertion  . Cancer     Rectal   . Arthritis   . Gout   . Seizures     more than 4 yrs since last seizure. UNknown etiology    Past Surgical History  Procedure Laterality Date  . Abdominal hysterectomy    . Multiple extractions with alveoloplasty N/A 11/23/2012    Procedure: MULTIPLE EXTRACION 1, 2, 4, 5, 6, 7, 8, 9, 10, 11, 12, 13, 14, 17, 18, 20, 23, 24, 25, 26, 28, 29, 32 WITH ALVEOLOPLASTY, REMOVE BILATERAL TORI;  Surgeon: Gae Bon, DDS;  Location: [facility];  Service: Oral Surgery;  Laterality: N/A;  . Colonoscopy with esophagogastroduodenoscopy (egd) N/A 01/14/2013    Procedure: COLONOSCOPY WITH ESOPHAGOGASTRODUODENOSCOPY (EGD);  Surgeon: Rogene Houston, MD;  Location: [facility];  Service: Endoscopy;  Laterality: N/A;  250-moved to 315 Ann to notify pt  . Colonoscopy N/A 02/04/2013    Procedure: COLONOSCOPY;  Surgeon: Rogene Houston, MD;  Location: [facility];  Service: Endoscopy;  Laterality:  N/A;  225  . Eus N/A 02/18/2013    Procedure: LOWER ENDOSCOPIC ULTRASOUND (EUS);  Surgeon: Milus Banister, MD;  Location: [facility];  Service: Endoscopy;  Laterality: N/A;    History reviewed. No pertinent family history.  Social History:  reports that she quit smoking about 5 years ago. Her smoking use included Cigarettes. She smoked 0.00 packs per day. She has never used smokeless tobacco. She reports that she does not drink alcohol or use illicit drugs.  Allergies: No Known Allergies  Medications: I have reviewed the patient's current medications.  Results for orders placed during the hospital encounter of 07/26/13 (from the past 48 hour(s))  POCT I-STAT 4, (NA,K, GLUC, HGB,HCT)     Status: Abnormal   Collection Time    07/26/13  8:11 AM      Result Value Ref Range   Sodium 139  137 - 147 mEq/L   Potassium 4.5  3.7 - 5.3 mEq/L   Glucose, Bld 82  70 - 99 mg/dL   HCT 34.0 (*) 36.0 - 46.0 %   Hemoglobin 11.6 (*) 12.0 - 15.0 g/dL  PREPARE RBC (CROSSMATCH)     Status: None   Collection Time    07/26/13 12:38 PM      Result Value Ref Range   Order Confirmation ORDER PROCESSED BY BLOOD BANK    GLUCOSE, CAPILLARY     Status: Abnormal   Collection Time    07/26/13  2:58 PM  Result Value Ref Range   Glucose-Capillary 101 (*) 70 - 99 mg/dL  HEMOGLOBIN AND HEMATOCRIT, BLOOD     Status: Abnormal   Collection Time    07/26/13  3:15 PM      Result Value Ref Range   Hemoglobin 9.5 (*) 12.0 - 15.0 g/dL   HCT 29.2 (*) 36.0 - 46.0 %  HEMOGLOBIN A1C     Status: Abnormal   Collection Time    07/26/13  3:15 PM      Result Value Ref Range   Hemoglobin A1C 6.0 (*) <5.7 %   Comment: (NOTE)                                                                               According to the ADA Clinical Practice Recommendations for 2011, when     HbA1c is used as a screening test:      >=6.5%   Diagnostic of Diabetes Mellitus               (if abnormal result is confirmed)     5.7-6.4%    Increased risk of developing Diabetes Mellitus     References:Diagnosis and Classification of Diabetes Mellitus,Diabetes     ELTR,3202,33(IDHWY 1):S62-S69 and Standards of Medical Care in             Diabetes - 2011,Diabetes Care,2011,34 (Suppl 1):S11-S61.   Mean Plasma Glucose 126 (*) <117 mg/dL   Comment: Performed at [facility], CAPILLARY     Status: Abnormal   Collection Time    07/26/13  5:13 PM      Result Value Ref Range   Glucose-Capillary 116 (*) 70 - 99 mg/dL   Comment 1 Notify RN    GLUCOSE, CAPILLARY     Status: Abnormal   Collection Time    07/26/13  9:42 PM      Result Value Ref Range   Glucose-Capillary 130 (*) 70 - 99 mg/dL   Comment 1 Documented in Chart     Comment 2 Notify RN    BASIC METABOLIC PANEL     Status: Abnormal   Collection Time    07/27/13  4:40 AM      Result Value Ref Range   Sodium 137  137 - 147 mEq/L   Potassium 6.9 (*) 3.7 - 5.3 mEq/L   Comment: CRITICAL RESULT CALLED TO, READ BACK BY AND VERIFIED WITH:     GREY,M AT 6:30AM ON 07/27/13 BY FESTERMAN,C   Chloride 100  96 - 112 mEq/L   CO2 23  19 - 32 mEq/L   Glucose, Bld 162 (*) 70 - 99 mg/dL   BUN 34 (*) 6 - 23 mg/dL   Creatinine, Ser 1.91 (*) 0.50 - 1.10 mg/dL   Calcium 7.6 (*) 8.4 - 10.5 mg/dL   GFR calc non Af Amer 27 (*) >90 mL/min   GFR calc Af Amer 31 (*) >90 mL/min   Comment: (NOTE)     The eGFR has been calculated using the CKD EPI equation.     This calculation has not been validated in all clinical situations.     eGFR's persistently <90 mL/min signify possible Chronic Kidney  Disease.  MAGNESIUM     Status: Abnormal   Collection Time    07/27/13  4:40 AM      Result Value Ref Range   Magnesium 1.0 (*) 1.5 - 2.5 mg/dL  PHOSPHORUS     Status: Abnormal   Collection Time    07/27/13  4:40 AM      Result Value Ref Range   Phosphorus 6.1 (*) 2.3 - 4.6 mg/dL  CBC     Status: Abnormal   Collection Time    07/27/13  4:40 AM      Result Value Ref Range    WBC 15.1 (*) 4.0 - 10.5 K/uL   RBC 3.32 (*) 3.87 - 5.11 MIL/uL   Hemoglobin 9.5 (*) 12.0 - 15.0 g/dL   HCT 29.6 (*) 36.0 - 46.0 %   MCV 89.2  78.0 - 100.0 fL   MCH 28.6  26.0 - 34.0 pg   MCHC 32.1  30.0 - 36.0 g/dL   RDW 13.6  11.5 - 15.5 %   Platelets 240  150 - 400 K/uL  POTASSIUM     Status: Abnormal   Collection Time    07/27/13  7:02 AM      Result Value Ref Range   Potassium 6.7 (*) 3.7 - 5.3 mEq/L   Comment: CRITICAL RESULT CALLED TO, READ BACK BY AND VERIFIED WITH:     PHILLIPS,C AT 7:30AM ON 07/27/13 BY FESTERMAN,C  GLUCOSE, CAPILLARY     Status: Abnormal   Collection Time    07/27/13  7:34 AM      Result Value Ref Range   Glucose-Capillary 157 (*) 70 - 99 mg/dL    No results found.  Review of Systems  Respiratory: Negative for shortness of breath.   Gastrointestinal: Negative for nausea and vomiting.   Blood pressure 102/54, pulse 81, temperature 98.5 F (36.9 C), temperature source Axillary, resp. rate 12, weight 107.2 kg (236 lb 5.3 oz), SpO2 100.00%. Physical Exam  Eyes: No scleral icterus.  Neck: No JVD present.  Cardiovascular: Normal rate and regular rhythm.   No murmur heard. Respiratory: She has wheezes. She has no rales.  GI:  S/P abdominal perineal resection with right colostomy / Postiive bowel sounds    Assessment/Plan: Problem #1 renal failure: Possibly acute. Etiology could be prerenal versus ATN. Since I don't have her outpatient medication list not sure whether other medications which affect her kidneys. Presently patient is none oliguric. Problem #2 hyperkalemia: Possibly secondary to renal failure however since patient has history of diabetes type IV RTA also may contribute. Problem #3 history of diabetes Problem #4 hypertension: Her blood pressure seems to be low normal. Problem #5 history of rectal cancer: Status post neoadjuvant radiation and presently status post abdominal perineal resection. Problem #6 mental retardation Problem #7  anemia: Possibly secondary to blood loss Problem #8 history of seizure disorder Problem #9 history of asthma Plan: We'll give patient calcium gluconate one dose Agree with D50 and insulin We'll change her IV fluid to normal saline at 145 cc per hour We'll use Lasix 40 mg IV twice a day We'll check urine for fractional excretion of sodium. If her renal function does not improve we'll consider putting ultrasound of the kidneys. We'll check UA and also would repeat her blood work after 4 hours. He  Harriett Sine 07/27/2013, 8:10 AM

## 2013-07-27 NOTE — Care Management Utilization Note (Signed)
UR completed 

## 2013-07-27 NOTE — Addendum Note (Signed)
Addendum created 07/27/13 1036 by Vista Deck, CRNA   Modules edited: Notes Section   Notes Section:  File: [id number]

## 2013-07-27 NOTE — Anesthesia Postprocedure Evaluation (Signed)
  Anesthesia Post-op Note  Patient: [name]  Procedure(s) Performed: Procedure(s): FLEXIBLE SIGMOIDOSCOPY (N/A)  ABDOMINAL PERINEAL RESECTION (N/A) HYSTERECTOMY SUPRACERVICAL ABDOMINAL  (N/A) SALPINGO OOPHORECTOMY (Bilateral) COLOSTOMY (Left)  Patient Location: ICU  Anesthesia Type:General  Level of Consciousness: awake and patient cooperative  Airway and Oxygen Therapy: Patient Spontanous Breathing  Post-op Pain: mild  Post-op Assessment: Post-op Vital signs reviewed  Post-op Vital Signs: Reviewed  Last Vitals:  Filed Vitals:   07/27/13 0800  BP: 107/43  Pulse:   Temp: 36.6 C  Resp: 12    Complications: No apparent anesthesia complications Potassium 6.7, nephrology consult possible prerenal azotemia. Blood Glucose 157. PICC Line being inserted.

## 2013-07-27 NOTE — Progress Notes (Signed)
1 Day Post-Op  Subjective: Patient denies any significant abdominal pain.  Objective: Vital signs in last 24 hours: Temp:  [97.5 F (36.4 C)-98.5 F (36.9 C)] 98.5 F (36.9 C) (05/12 0400) Pulse Rate:  [80-87] 81 (05/11 2200) Resp:  [0-73] 12 (05/12 0700) BP: (93-167)/(50-82) 102/54 mmHg (05/12 0700) SpO2:  [95 %-100 %] 95 % (05/12 0834) Weight:  [107.2 kg (236 lb 5.3 oz)] 107.2 kg (236 lb 5.3 oz) (05/12 0500) Last BM Date: 07/27/13  Intake/Output from previous day: 05/11 0701 - 05/12 0700 In: 7627.5 [P.O.:180; I.V.:7112.5; Blood:335] Out: 1910 [Urine:1010; Drains:250; Blood:650] Intake/Output this shift:    General appearance: alert, cooperative and no distress Resp: clear to auscultation bilaterally Cardio: regular rate and rhythm, S1, S2 normal, no murmur, click, rub or gallop GI: Soft. Dressing intact. Ostomy pink with a little fluid in the ostomy bag. Perineum healing well.  Lab Results:   Recent Labs  07/26/13 1515 07/27/13 0440  WBC  --  15.1*  HGB 9.5* 9.5*  HCT 29.2* 29.6*  PLT  --  240   BMET  Recent Labs  07/26/13 0811 07/27/13 0440 07/27/13 0702  NA 139 137  --   K 4.5 6.9* 6.7*  CL  --  100  --   CO2  --  23  --   GLUCOSE 82 162*  --   BUN  --  34*  --   CREATININE  --  1.91*  --   CALCIUM  --  7.6*  --    PT/INR No results found for this basename: LABPROT, INR,  in the last 72 hours  Studies/Results: No results found.  Anti-infectives: Anti-infectives   Start     Dose/Rate Route Frequency Ordered Stop   07/26/13 0817  ertapenem (INVANZ) 1 g in sodium chloride 0.9 % 50 mL IVPB     1 g 100 mL/hr over 30 Minutes Intravenous On call to O.R. 07/26/13 0973 07/26/13 0958      Assessment/Plan: s/p Procedure(s): FLEXIBLE SIGMOIDOSCOPY  ABDOMINAL PERINEAL RESECTION HYSTERECTOMY SUPRACERVICAL ABDOMINAL  SALPINGO OOPHORECTOMY COLOSTOMY Impression: Hyperkalemia, 12-lead EKG pending. Nephrology has been consulted. Patient has been given  Lasix, IV bolus, insulin, and D50. Calcium carbonate has also been given. We'll follow closely. Suspect this is due to 2 prerenal azotemia. Urine output is low but present. Hemoglobin is stable. Will have PICC line placed.  LOS: 1 day    Jamesetta So 07/27/2013

## 2013-07-27 NOTE — Care Management Note (Addendum)
    Page 1 of 2   08/02/2013     10:59:45 AM CARE MANAGEMENT NOTE 08/02/2013  Patient:  [name], [name]   Account Number:  [id number]  Date Initiated:  07/27/2013  Documentation initiated by:  Theophilus Kinds  Subjective/Objective Assessment:   Pt admitted from [facility] s/p bowel resection. Pt will return to facility at discharge.     Action/Plan:   CSW will arrange discharge to facility when medically stable.   Anticipated DC Date:  08/02/2013   Anticipated DC Plan:  ASSISTED LIVING / REST HOME  In-house referral  Clinical Social Worker      DC Forensic scientist  CM consult      PAC Choice  [place]   Choice offered to / List presented to:  C-2 HC POA / Guardian   DME arranged  OSTOMY SUPPLIES      DME agency  [place] arranged  HH-1 RN  [place].   Status of service:  Completed, signed off Medicare Important Message given?  YES (If response is "NO", the following Medicare IM given date fields will be blank) Date Medicare IM given:  08/02/2013 Date Additional Medicare IM given:    Discharge Disposition:  ASSISTED LIVING  Per UR Regulation:    If discussed at Long Length of Stay Meetings, dates discussed:    Comments:  08/02/13 [place], RN BSN CM Pt discharged to [facility] today with [facility] RN and PT (per facility choice). Romualdo Bolk of [facility] is aware and will collect the pts information from the chart. Hh services to start within 48 hours of discharge. Ostomy supplies will be obtained from [facility]. Pts nurse and pts guardian is aware of discharge arrangements. CSW to arrange discharge to the facility.  07/27/13 [place], RN BSN CM

## 2013-07-28 LAB — BASIC METABOLIC PANEL
BUN: 41 mg/dL — ABNORMAL HIGH (ref 6–23)
CO2: 26 mEq/L (ref 19–32)
CREATININE: 1.74 mg/dL — AB (ref 0.50–1.10)
Calcium: 7.6 mg/dL — ABNORMAL LOW (ref 8.4–10.5)
Chloride: 102 mEq/L (ref 96–112)
GFR calc Af Amer: 35 mL/min — ABNORMAL LOW (ref >90)
GFR, EST NON AFRICAN AMERICAN: 30 mL/min — AB (ref >90)
Glucose, Bld: 141 mg/dL — ABNORMAL HIGH (ref 70–99)
Potassium: 4.5 mEq/L (ref 3.7–5.3)
SODIUM: 136 meq/L — AB (ref 137–147)

## 2013-07-28 LAB — GLUCOSE, CAPILLARY
GLUCOSE-CAPILLARY: 109 mg/dL — AB (ref 70–99)
GLUCOSE-CAPILLARY: 150 mg/dL — AB (ref 70–99)
Glucose-Capillary: 179 mg/dL — ABNORMAL HIGH (ref 70–99)
Glucose-Capillary: 160 mg/dL — ABNORMAL HIGH (ref 70–99)

## 2013-07-28 LAB — PHOSPHORUS: Phosphorus: 4.2 mg/dL (ref 2.3–4.6)

## 2013-07-28 LAB — CBC
HCT: 21.1 % — ABNORMAL LOW (ref 36.0–46.0)
Hemoglobin: 6.9 g/dL — CL (ref 12.0–15.0)
MCH: 28.3 pg (ref 26.0–34.0)
MCHC: 32.7 g/dL (ref 30.0–36.0)
MCV: 86.5 fL (ref 78.0–100.0)
Platelets: 154 10*3/uL (ref 150–400)
RBC: 2.44 MIL/uL — AB (ref 3.87–5.11)
RDW: 13.5 % (ref 11.5–15.5)
WBC: 8.9 10*3/uL (ref 4.0–10.5)

## 2013-07-28 LAB — MAGNESIUM: MAGNESIUM: 1.8 mg/dL (ref 1.5–2.5)

## 2013-07-28 NOTE — Progress Notes (Signed)
hgb 6.9 called by lab this am  MD notified

## 2013-07-28 NOTE — Progress Notes (Signed)
2 Days Post-Op  Subjective: No complaints from patient.  Objective: Vital signs in last 24 hours: Temp:  [98.1 F (36.7 C)-98.8 F (37.1 C)] 98.4 F (36.9 C) (05/13 1030) Resp:  [13-20] 17 (05/13 1030) BP: (104-139)/(36-74) 131/59 mmHg (05/13 1000) SpO2:  [100 %] 100 % (05/13 0821) Weight:  [109.2 kg (240 lb 11.9 oz)] 109.2 kg (240 lb 11.9 oz) (05/13 0500) Last BM Date: 07/27/13  Intake/Output from previous day: 05/12 0701 - 05/13 0700 In: 3789.9 [P.O.:560; I.V.:3119.9; IV Piggyback:110] Out: 2760 [Urine:2650; Drains:110] Intake/Output this shift: Total I/O In: 302.5 [I.V.:290; Blood:12.5] Out: 200 [Stool:200]  General appearance: alert, cooperative and no distress Resp: clear to auscultation bilaterally Cardio: regular rate and rhythm, S1, S2 normal, no murmur, click, rub or gallop GI: Soft. Incision healing well. Ostomy pink and patent. Bowel sounds appreciated. Stool in ostomy bag. Pelvic: Perineal incision healing well.  Lab Results:   Recent Labs  07/27/13 0440 07/28/13 0505  WBC 15.1* 8.9  HGB 9.5* 6.9*  HCT 29.6* 21.1*  PLT 240 154   BMET  Recent Labs  07/27/13 1249 07/28/13 0505  NA 136* 136*  K 5.6* 4.5  CL 102 102  CO2 24 26  GLUCOSE 205* 141*  BUN 39* 41*  CREATININE 2.21* 1.74*  CALCIUM 7.3* 7.6*   PT/INR No results found for this basename: LABPROT, INR,  in the last 72 hours  Studies/Results: Dg Chest Port 1 View  07/27/2013   CLINICAL DATA: Status post PICC line placement on the right  EXAM: PORTABLE CHEST - 1 VIEW  COMPARISON:  DG CHEST 2 VIEW dated 07/16/2013  FINDINGS: The PICC line tip lies in the region of the proximal to mid SVC. There is no evidence of an immediate postprocedure complication. The lung volumes are low. The cardiac silhouette is normal in size. The pulmonary vascularity is not engorged.  IMPRESSION: There is no evidence of an immediate postprocedure complication following placement of PICC line via the right upper  extremity.   Electronically Signed   By: David  Martinique   On: 07/27/2013 11:29    Anti-infectives: Anti-infectives   Start     Dose/Rate Route Frequency Ordered Stop   07/26/13 0817  ertapenem (INVANZ) 1 g in sodium chloride 0.9 % 50 mL IVPB     1 g 100 mL/hr over 30 Minutes Intravenous On call to O.R. 07/26/13 7893 07/26/13 0958      Assessment/Plan: s/p Procedure(s): FLEXIBLE SIGMOIDOSCOPY  ABDOMINAL PERINEAL RESECTION HYSTERECTOMY SUPRACERVICAL ABDOMINAL  SALPINGO OOPHORECTOMY COLOSTOMY Impression: Postoperative day 2. Hyperkalemia has resolved. Renal function returning to normal. Hemoglobin was secondary to acute surgical blood loss and chronic anemia. Will supplement with 2 units of packed red blood cells. We'll leave Foley in place due to aggressive diuresis and care for her perineal surgical wound. Appreciate ostomy nurse support. We'll get physical therapy evaluation tomorrow. We'll get patient up in chair. Will advance diet as tolerated.  LOS: 2 days    Jamesetta So 07/28/2013

## 2013-07-28 NOTE — Progress Notes (Signed)
[name]  MRN: [id number]  DOB/AGE: 1950/04/12 62 y.o.  Primary Care Physician:HAWKINS,EDWARD L, MD  Admit date: 07/26/2013  Chief Complaint: No chief complaint on file.   S-Pt presented on  07/26/2013 with No chief complaint on file. .    Pt today feels better  Meds  . alvimopan  12 mg Oral BID  . amLODipine  2.5 mg Oral Daily  . enoxaparin (LOVENOX) injection  40 mg Subcutaneous Q24H  . furosemide  40 mg Intravenous BID  . insulin aspart  0-20 Units Subcutaneous TID WC  . metoprolol succinate  100 mg Oral QHS  . mometasone-formoterol  2 puff Inhalation BID  . phenytoin  100 mg Oral BID  . sodium chloride  10-40 mL Intracatheter Q12H     Physical Exam: Vital signs in last 24 hours: Temp:  [98.1 F (36.7 C)-98.8 F (37.1 C)] 98.3 F (36.8 C) (05/13 0928) Resp:  [13-20] 19 (05/13 0928) BP: (104-139)/(36-74) 117/74 mmHg (05/13 0900) SpO2:  [100 %] 100 % (05/13 0821) Weight:  [240 lb 11.9 oz (109.2 kg)] 240 lb 11.9 oz (109.2 kg) (05/13 0500) Weight change: 4 lb 6.6 oz (2 kg) Last BM Date: 07/27/13  Intake/Output from previous day: 05/12 0701 - 05/13 0700 In: 3789.9 [P.O.:560; I.V.:3119.9; IV Piggyback:110] Out: 2760 [Urine:2650; Drains:110] Total I/O In: 302.5 [I.V.:290; Blood:12.5] Out: 200 [Stool:200]   Physical Exam: General- pt is awake,alert, oriented to time place and person Resp- No acute REsp distress, CTA B/L NO Rhonchi CVS- S1S2 regular in rate and rhythm GIT- BS+, soft, Tenderness+, ND, Colostomy bag and drain insitu EXT- NO LE Edema, Cyanosis   Lab Results: CBC  Recent Labs  07/27/13 0440 07/28/13 0505  WBC 15.1* 8.9  HGB 9.5* 6.9*  HCT 29.6* 21.1*  PLT 240 154    BMET  Recent Labs  07/27/13 1249 07/28/13 0505  NA 136* 136*  K 5.6* 4.5  CL 102 102  CO2 24 26  GLUCOSE 205* 141*  BUN 39* 41*  CREATININE 2.21* 1.74*  CALCIUM 7.3* 7.6*   Creat Trend 2015  1.09=>1.81=>2.21=>1.74        10.9--1.20( as outpt)  2010   1.0--1.24   MICRO No results found for this or any previous visit (from the past 240 hour(s)).    Lab Results  Component Value Date   CALCIUM 7.6* 07/28/2013   PHOS 4.2 07/28/2013         Impression: 1)Renal  AKI secondary to Prerenal/ATN                AKI now improving                Most likely AKI on CKD               CKD stage 3 .               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  2)HTN Medication- On Diuretics- On Calcium Channel Blockers On Beta blockers   3)Anemia HGb at goal (9--11)   4)CKD Mineral-Bone Disorder  PTH not avail  Secondary Hyperparathyroidism w/u pending . Phosphorus at goal.   5)ONco- Hx of rectal cancer S/P abdominoperineal resection, total abdominal hysterectomy with bilateral salpingo-oophorectomy Primary MD following  6)Electrolytes  Hyperkalemia-now better NOrmonatremic   7)Acid base Co2 at goal   8) CNS-Hx of Mental retardation stable  Plan:  Will continue current tx.       Jazzmyne Rasnick S Titiana Severa 07/28/2013, 10:17 AM

## 2013-07-28 NOTE — Addendum Note (Signed)
Addendum created 07/28/13 0902 by Mickel Baas, CRNA   Modules edited: Notes Section   Notes Section:  File: [id number]

## 2013-07-28 NOTE — Consult Note (Signed)
WOC ostomy consult note Stoma type/location: LLQ colostomy (Permanent). Dr. Arnoldo Morale in to assess patient during my visit and we together assessed the perineal surgical site.  Sutures intact, orders received for maintaining area clean, dry and free of maceration. Order also received for a pressure redistribution chair pad for her use OOB in chair. Stomal assessment/size: 2 inches round when traction applied at 12 o'clock, oval at rest.  Pink, moist mucosa, flush with abdomen. Peristomal assessment: intact in the immediate peristomal area; a linear fracture blister (unroofed to reveal a partial thickness skin injury) presents from 3-5 o'clock at the tape edge of ostomy pouching system-medical adhesive related skin injury (MARSI).  This injury measures 4cm x 1cm x 0.2cm and presents with a moist, pink wound bed. Treatment options for stomal/peristomal skin: peristoma skin is provided with a skin barrier ring and a convex pouching system (1-piece) is provided.  The partial thickness skin injury (MARSI) is treated by covering with the hydrocolloid remnants of the ostomy pouch and topped with tape.  I feel as this will recover in 5-7 days using this therapeutic intervention. Output Old pouch emptied with 261ml of soft brown stool and flatus Ostomy pouching: 1pc.convex pouch with skin barrier ring.  Excess hydrocolloid used to cover partial thickness tissue injury resultant from [facility]. Education provided: Patient is pleasant and cooperative, but it is not yet known to what extent she will be participating in her care.  She currently lives in a group home.  Dr. Arnoldo Morale is exploring the possibility of a discharge to a skilled facility for continued recovery until previous state of wellness is achieved at which time a determination of her disposition can be accomplished. Educational materials are left in room (booklet and 1-page teaching sheet ) and supplies have been ordered to the bedside.  Patient is NOT established  with [facility] at this time. Thank you for your referral to our department for ostomy and wound care services.[place] nursing team will follow, will remain available to this patient, the nursing, srugical and medical teams.  Thanks, Maudie Flakes, MSN, RN, [place], [place], [place] [phone])

## 2013-07-28 NOTE — Progress Notes (Signed)
Per Dr Arnoldo Morale we are to leave patients foley in place, in order to keep rectal wound clean and dry.

## 2013-07-28 NOTE — Anesthesia Postprocedure Evaluation (Signed)
  Anesthesia Post-op Note  Patient: [name]  Procedure(s) Performed: Procedure(s): FLEXIBLE SIGMOIDOSCOPY (N/A)  ABDOMINAL PERINEAL RESECTION (N/A) HYSTERECTOMY SUPRACERVICAL ABDOMINAL  (N/A) SALPINGO OOPHORECTOMY (Bilateral) COLOSTOMY (Left)  Patient Location: ICU 6  Anesthesia Type:General  Level of Consciousness: awake and patient cooperative  Airway and Oxygen Therapy: Patient Spontanous Breathing  Post-op Pain: mild  Post-op Assessment: Post-op Vital signs reviewed, Patient's Cardiovascular Status Stable, Respiratory Function Stable, Patent Airway, No signs of Nausea or vomiting and Pain level controlled  Post-op Vital Signs: Reviewed and stable  Last Vitals:  Filed Vitals:   07/28/13 0730  BP:   Pulse:   Temp: 37.1 C  Resp:     Complications: No apparent anesthesia complications

## 2013-07-28 NOTE — Clinical Social Work Psychosocial (Signed)
     Clinical Social Work Department BRIEF PSYCHOSOCIAL ASSESSMENT 07/28/2013  Patient:  [name], [name]     Account Number:  [id number]     Admit date:  07/26/2013  Clinical Social Worker:  Iona Coach  Date/Time:  07/28/2013 05:29 PM  Referred by:  Physician  Date Referred:  07/27/2013 Referred for  Other - See comment   Other Referral:   From ALF-  ? increase level of care?   Interview type:  Other - See comment Other interview type:   Guardian -[place]  (c) [phone]    PSYCHOSOCIAL DATA Living Status:  FACILITY Admitted from facility:  Other Level of care:  Assisted Living Primary support name:  [name]  (c) [phone] Primary support relationship to patient:   Degree of support available:   Relationship:  Legal Guardian    CURRENT CONCERNS Current Concerns  Post-Acute Placement   Other Concerns:   ALF VS SNF    SOCIAL WORK ASSESSMENT / PLAN 63 year old female- admitted to [facility] from [facility]. Patient is currently in the ICU and now has a new colostomy.  CSW met today with patient's legal guardian- [name] with [place] [facility] to discuss level of care options. Per Ms. [name]- the facility wants to acccept patient back even though she has a new colostomy and feels that they can manage her care.  Currently awaiting PT evaluation to determine patient's level of functioning. Should she require short term SNF- patient wants to go to [facility] or [place] as she has a special friend who resides there.  She will also require a level 2 PASARR screen if SNF is indicated.  Patient has diagnosis of MR.   Assessment/plan status:  Psychosocial Support/Ongoing Assessment of Needs Other assessment/ plan:   Information/referral to community resources:   ALF vs SNF- will reassess in the morning    PATIENTS/FAMILYS RESPONSE TO PLAN OF CARE: Patient is oriented to person but not to place  or time. She is very pleasant.  Patient has a legal guardian with [employer] who makes all medical decisions for patient. Will need to determine level of care that is most appropriate for patient pending further assessments. If she is able to return to ALF- will need to insure that staff has adequate training for care of colostomy.  CSW will monitor for PT eval and discuss further with MD.

## 2013-07-29 LAB — BASIC METABOLIC PANEL
BUN: 26 mg/dL — AB (ref 6–23)
CHLORIDE: 103 meq/L (ref 96–112)
CO2: 30 mEq/L (ref 19–32)
CREATININE: 1.15 mg/dL — AB (ref 0.50–1.10)
Calcium: 7.8 mg/dL — ABNORMAL LOW (ref 8.4–10.5)
GFR calc Af Amer: 58 mL/min — ABNORMAL LOW (ref >90)
GFR calc non Af Amer: 50 mL/min — ABNORMAL LOW (ref >90)
Glucose, Bld: 162 mg/dL — ABNORMAL HIGH (ref 70–99)
POTASSIUM: 3.9 meq/L (ref 3.7–5.3)
Sodium: 143 mEq/L (ref 137–147)

## 2013-07-29 LAB — GLUCOSE, CAPILLARY
GLUCOSE-CAPILLARY: 162 mg/dL — AB (ref 70–99)
GLUCOSE-CAPILLARY: 190 mg/dL — AB (ref 70–99)
Glucose-Capillary: 160 mg/dL — ABNORMAL HIGH (ref 70–99)
Glucose-Capillary: 118 mg/dL — ABNORMAL HIGH (ref 70–99)

## 2013-07-29 LAB — CBC
HEMATOCRIT: 27.4 % — AB (ref 36.0–46.0)
Hemoglobin: 9.2 g/dL — ABNORMAL LOW (ref 12.0–15.0)
MCH: 29.2 pg (ref 26.0–34.0)
MCHC: 33.6 g/dL (ref 30.0–36.0)
MCV: 87 fL (ref 78.0–100.0)
Platelets: 155 10*3/uL (ref 150–400)
RBC: 3.15 MIL/uL — ABNORMAL LOW (ref 3.87–5.11)
RDW: 14 % (ref 11.5–15.5)
WBC: 8.9 10*3/uL (ref 4.0–10.5)

## 2013-07-29 LAB — MAGNESIUM: Magnesium: 1.2 mg/dL — ABNORMAL LOW (ref 1.5–2.5)

## 2013-07-29 LAB — PHOSPHORUS: PHOSPHORUS: 3.2 mg/dL (ref 2.3–4.6)

## 2013-07-29 MED ORDER — K PHOS MONO-SOD PHOS DI & MONO 155-852-130 MG PO TABS
500.0000 mg | ORAL_TABLET | Freq: Two times a day (BID) | ORAL | Status: DC
  Administered 2013-07-29 (×2): 500 mg via ORAL
  Filled 2013-07-29 (×3): qty 2

## 2013-07-29 MED ORDER — MAGNESIUM SULFATE 40 MG/ML IJ SOLN
2.0000 g | Freq: Once | INTRAMUSCULAR | Status: AC
  Administered 2013-07-29: 2 g via INTRAVENOUS
  Filled 2013-07-29: qty 50

## 2013-07-29 NOTE — Progress Notes (Signed)
Subjective: Interval History: has no complaint of nausea or vomiting. Patient has this moment denies any difficulty increasing..  Objective: Vital signs in last 24 hours: Temp:  [98.1 F (36.7 C)-100.5 F (38.1 C)] 99.6 F (37.6 C) (05/14 0400) Pulse Rate:  [96] 96 (05/13 2216) Resp:  [11-20] 16 (05/14 0600) BP: (105-155)/(37-105) 141/60 mmHg (05/14 0600) SpO2:  [100 %] 100 % (05/13 0821) Weight:  [105.2 kg (231 lb 14.8 oz)] 105.2 kg (231 lb 14.8 oz) (05/14 0500) Weight change: -4 kg (-8 lb 13.1 oz)  Intake/Output from previous day: 05/13 0701 - 05/14 0700 In: 2959 [I.V.:2276.5; Blood:682.5] Out: 31540 [Urine:10200; Drains:130; Stool:500] Intake/Output this shift:    General appearance: alert, cooperative and no distress Resp: clear to auscultation bilaterally Cardio: regular rate and rhythm, S1, S2 normal, no murmur, click, rub or gallop GI: soft, non-tender; bowel sounds normal; no masses,  no organomegaly Extremities: edema Trace pedal edema  Lab Results:  Recent Labs  07/28/13 0505 07/29/13 0417  WBC 8.9 8.9  HGB 6.9* 9.2*  HCT 21.1* 27.4*  PLT 154 155   BMET:  Recent Labs  07/28/13 0505 07/29/13 0417  NA 136* 143  K 4.5 3.9  CL 102 103  CO2 26 30  GLUCOSE 141* 162*  BUN 41* 26*  CREATININE 1.74* 1.15*  CALCIUM 7.6* 7.8*   No results found for this basename: PTH,  in the last 72 hours Iron Studies: No results found for this basename: IRON, TIBC, TRANSFERRIN, FERRITIN,  in the last 72 hours  Studies/Results: Dg Chest Port 1 View  07/27/2013   CLINICAL DATA: Status post PICC line placement on the right  EXAM: PORTABLE CHEST - 1 VIEW  COMPARISON:  DG CHEST 2 VIEW dated 07/16/2013  FINDINGS: The PICC line tip lies in the region of the proximal to mid SVC. There is no evidence of an immediate postprocedure complication. The lung volumes are low. The cardiac silhouette is normal in size. The pulmonary vascularity is not engorged.  IMPRESSION: There is no  evidence of an immediate postprocedure complication following placement of PICC line via the right upper extremity.   Electronically Signed   By: David  Martinique   On: 07/27/2013 11:29    I have reviewed the patient's current medications.  Assessment/Plan: Problem #1 acute kidney injury: Her BUN is 26 and creatinine is 1.15 her renal function progressively seems to improving. Problem #2 hyperkalemia potassium is 3.9 has corrected Problem #3 history of diabetes Problem #4 hypertension her blood pressure seems to be reasonably controlled Problem #5 anemia: Her hemoglobin and hematocrit is better. Problem #6 history of rectal cancer status post surgery.  Problem #7 hypophosphatemia: Possibly secondary to poor nutrition. Plan: We'll start patient on K-Phos Neutral one tablet by mouth twice a day We'll decrease her IV fluid to to 75 cc per hour We'll DC Lasix We'll check her basic metabolic panel and phosphorus in the morning.   LOS: 3 days   [name] S Olar Santini 07/29/2013,7:34 AM

## 2013-07-29 NOTE — Progress Notes (Signed)
3 Days Post-Op  Subjective: No complaints.  Objective: Vital signs in last 24 hours: Temp:  [98.1 F (36.7 C)-100.5 F (38.1 C)] 99.6 F (37.6 C) (05/14 0400) Pulse Rate:  [96] 96 (05/13 2216) Resp:  [11-20] 17 (05/14 0800) BP: (105-155)/(37-105) 131/48 mmHg (05/14 0800) SpO2:  [99 %] 99 % (05/14 0829) Weight:  [105.2 kg (231 lb 14.8 oz)] 105.2 kg (231 lb 14.8 oz) (05/14 0500) Last BM Date: 07/28/13  Intake/Output from previous day: 05/13 0701 - 05/14 0700 In: 3084.7 [I.V.:2402.2; Blood:682.5] Out: 99242 [Urine:10200; Drains:130; Stool:500] Intake/Output this shift: Total I/O In: 161.9 [I.V.:161.9] Out: -   General appearance: alert, cooperative and no distress Resp: clear to auscultation bilaterally Cardio: regular rate and rhythm, S1, S2 normal, no murmur, click, rub or gallop GI: Soft. Incision healing well. Ostomy pink and patent. Bowel sounds active.  Lab Results:   Recent Labs  07/28/13 0505 07/29/13 0417  WBC 8.9 8.9  HGB 6.9* 9.2*  HCT 21.1* 27.4*  PLT 154 155   BMET  Recent Labs  07/28/13 0505 07/29/13 0417  NA 136* 143  K 4.5 3.9  CL 102 103  CO2 26 30  GLUCOSE 141* 162*  BUN 41* 26*  CREATININE 1.74* 1.15*  CALCIUM 7.6* 7.8*   PT/INR No results found for this basename: LABPROT, INR,  in the last 72 hours  Studies/Results: Dg Chest Port 1 View  07/27/2013   CLINICAL DATA: Status post PICC line placement on the right  EXAM: PORTABLE CHEST - 1 VIEW  COMPARISON:  DG CHEST 2 VIEW dated 07/16/2013  FINDINGS: The PICC line tip lies in the region of the proximal to mid SVC. There is no evidence of an immediate postprocedure complication. The lung volumes are low. The cardiac silhouette is normal in size. The pulmonary vascularity is not engorged.  IMPRESSION: There is no evidence of an immediate postprocedure complication following placement of PICC line via the right upper extremity.   Electronically Signed   By: David  Martinique   On: 07/27/2013 11:29     Anti-infectives: Anti-infectives   Start     Dose/Rate Route Frequency Ordered Stop   07/26/13 0817  ertapenem (INVANZ) 1 g in sodium chloride 0.9 % 50 mL IVPB     1 g 100 mL/hr over 30 Minutes Intravenous On call to O.R. 07/26/13 6834 07/26/13 0958      Assessment/Plan: s/p Procedure(s): FLEXIBLE SIGMOIDOSCOPY  ABDOMINAL PERINEAL RESECTION HYSTERECTOMY SUPRACERVICAL ABDOMINAL  SALPINGO OOPHORECTOMY COLOSTOMY Impression: Stable, status post APR, postoperative day 3. Renal function has significantly improved. Has had 10 L of fluid in Foley bag. Hyperkalemia has resolved. Hemoglobin is stable. Magnesium levels 1.2, which will be corrected. Final pathology revealed a T3, N0, M0 rectal carcinoma. Margins are clear. We'll get physical therapy consultation and evaluation today. We'll leave Foley in today do to active diuresis. Will transfer to regular floor today.  LOS: 3 days    Jamesetta So 07/29/2013

## 2013-07-29 NOTE — Evaluation (Signed)
Physical Therapy Evaluation Patient Details Name: [name] MRN: [id number] DOB: 10-May-1950 Today's Date: 07/29/2013   History of Present Illness  This 50 [place] Female presents for  newly diagnoses rectal cancer.  Biopsy proven by colonoscopy, EUS shows 7cm long mass, 5cm from anal verge, one lymph node suspicious for metastatic disease.  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  Does not have abdominal pain currently.  Has undergone neoadjuvant therapy and now presents following resection of her rectal carcinoma.  Clinical Impression  Patient requires assistance with set up only for transfers and IV pole/walker management anticipating patient will be safe for discharge to group home with continued home health physical therapy to continue to address and progress strengthening to improve balance and independence. PT will continue to see patient 6x a week for strengthening and balance training in anticipation of home discharge.      Follow Up Recommendations Home health PT    Equipment Recommendations  None recommended by PT (patient already owns front wheeled walker. )    Recommendations for Other Services       Precautions / Restrictions Precautions Precautions: Fall Precaution Comments: At falls risk due to difficulty managing walker and IV pole, requires assitance  Restrictions Weight Bearing Restrictions: No      Mobility  Bed Mobility Overal bed mobility: Modified Independent                Transfers Overall transfer level: Modified independent Equipment used: Rolling walker (2 wheeled) Transfers: Sit to/from Stand Sit to Stand: Supervision         General transfer comment: supervision for set up and IV pole management.   Ambulation/Gait Ambulation/Gait assistance: Min assist;Min guard Ambulation Distance (Feet): 150 Feet Assistive device: Rolling walker (2 wheeled) Gait Pattern/deviations: WFL(Within Functional Limits)     General  Gait Details: patient requires intermittent asssitance for walker and IV pole management.    Stairs            Wheelchair Mobility    Modified Rankin (Stroke Patients Only)       Balance Overall balance assessment: Needs assistance                                           Pertinent Vitals/Pain No pain    Home Living Family/patient expects to be discharged to:: Group home                      Prior Function Level of Independence: Independent with assistive device(s)               Hand Dominance        Extremity/Trunk Assessment               Lower Extremity Assessment: Generalized weakness      Cervical / Trunk Assessment: Kyphotic  Communication   Communication: No difficulties;Other (comment) (Patient is responsive but does not answer questions immediately, requires additional cuing to answer all questions. )  Cognition Arousal/Alertness: Awake/alert Behavior During Therapy: WFL for tasks assessed/performed Overall Cognitive Status: Within Functional Limits for tasks assessed                      General Comments      Exercises Total Joint Exercises Ankle Circles/Pumps: AROM;Both;10 reps      Assessment/Plan    PT Assessment Patient  needs continued PT services  PT Diagnosis Difficulty walking;Generalized weakness   PT Problem List Decreased strength;Decreased activity tolerance;Decreased balance  PT Treatment Interventions Gait training;Stair training;Functional mobility training;Balance training;Therapeutic exercise;Patient/family education   PT Goals (Current goals can be found in the Care Plan section) Acute Rehab PT Goals Patient Stated Goal: to be able to independently ambulate in home PT Goal Formulation: With patient Time For Goal Achievement: 08/05/13 Potential to Achieve Goals: Good    Frequency Min 6X/week (twice daily according to order)   Barriers to discharge         Co-evaluation               End of Session   Activity Tolerance: Patient tolerated treatment well Patient left: in chair;with nursing/sitter in room Nurse Communication: Mobility status         Time: 0820-0840 PT Time Calculation (min): 20 min   Charges:   PT Evaluation $Initial PT Evaluation Tier I: 1 Procedure PT Treatments $Gait Training: 8-22 mins   PT G Codes:          Tajah Noguchi R Beatriz Settles Pt ADPT 07/29/2013, 9:31 AM

## 2013-07-30 LAB — TYPE AND SCREEN
ABO/RH(D): A POS
Antibody Screen: NEGATIVE
UNIT DIVISION: 0
UNIT DIVISION: 0
UNIT DIVISION: 0
Unit division: 0

## 2013-07-30 LAB — BASIC METABOLIC PANEL
BUN: 17 mg/dL (ref 6–23)
CO2: 31 mEq/L (ref 19–32)
Calcium: 8.4 mg/dL (ref 8.4–10.5)
Chloride: 103 mEq/L (ref 96–112)
Creatinine, Ser: 0.9 mg/dL (ref 0.50–1.10)
GFR calc Af Amer: 78 mL/min — ABNORMAL LOW (ref >90)
GFR calc non Af Amer: 67 mL/min — ABNORMAL LOW (ref >90)
Glucose, Bld: 120 mg/dL — ABNORMAL HIGH (ref 70–99)
Potassium: 4.1 mEq/L (ref 3.7–5.3)
Sodium: 143 mEq/L (ref 137–147)

## 2013-07-30 LAB — GLUCOSE, CAPILLARY
Glucose-Capillary: 136 mg/dL — ABNORMAL HIGH (ref 70–99)
Glucose-Capillary: 188 mg/dL — ABNORMAL HIGH (ref 70–99)
Glucose-Capillary: 208 mg/dL — ABNORMAL HIGH (ref 70–99)
Glucose-Capillary: 83 mg/dL (ref 70–99)

## 2013-07-30 LAB — CBC
HCT: 30 % — ABNORMAL LOW (ref 36.0–46.0)
Hemoglobin: 9.8 g/dL — ABNORMAL LOW (ref 12.0–15.0)
MCH: 28.7 pg (ref 26.0–34.0)
MCHC: 32.7 g/dL (ref 30.0–36.0)
MCV: 88 fL (ref 78.0–100.0)
Platelets: 157 10*3/uL (ref 150–400)
RBC: 3.41 MIL/uL — ABNORMAL LOW (ref 3.87–5.11)
RDW: 13.9 % (ref 11.5–15.5)
WBC: 8.6 10*3/uL (ref 4.0–10.5)

## 2013-07-30 LAB — MAGNESIUM: Magnesium: 1.2 mg/dL — ABNORMAL LOW (ref 1.5–2.5)

## 2013-07-30 LAB — PHOSPHORUS: Phosphorus: 3.4 mg/dL (ref 2.3–4.6)

## 2013-07-30 MED ORDER — MAGNESIUM OXIDE 400 (241.3 MG) MG PO TABS
400.0000 mg | ORAL_TABLET | Freq: Every day | ORAL | Status: DC
  Administered 2013-07-30 – 2013-08-02 (×4): 400 mg via ORAL
  Filled 2013-07-30 (×5): qty 1

## 2013-07-30 MED ORDER — MAGNESIUM SULFATE 4000MG/100ML IJ SOLN
4.0000 g | Freq: Once | INTRAMUSCULAR | Status: AC
  Administered 2013-07-30: 4 g via INTRAVENOUS
  Filled 2013-07-30: qty 100

## 2013-07-30 MED ORDER — METFORMIN HCL 500 MG PO TABS
1000.0000 mg | ORAL_TABLET | Freq: Every day | ORAL | Status: DC
  Administered 2013-07-31 – 2013-08-02 (×3): 1000 mg via ORAL
  Filled 2013-07-30 (×3): qty 2

## 2013-07-30 NOTE — Progress Notes (Signed)
Removed pt's Foley. Pt DTV at 1830. Will continue to monitor.

## 2013-07-30 NOTE — Progress Notes (Signed)
4 Days Post-Op  Subjective: Moderate incisional pain when patient coughs or moves.  Objective: Vital signs in last 24 hours: Temp:  [97.9 F (36.6 C)-98 F (36.7 C)] 98 F (36.7 C) (05/15 0524) Pulse Rate:  [71-75] 71 (05/15 0524) Resp:  [18-20] 20 (05/15 0524) BP: (149-183)/(65-78) 165/75 mmHg (05/15 0524) SpO2:  [98 %-99 %] 98 % (05/15 0803) Last BM Date: 07/28/13  Intake/Output from previous day: 05/14 0701 - 05/15 0700 In: 1260.7 [P.O.:240; I.V.:940.7; IV Piggyback:50] Out: 4650 [Urine:8300; Drains:30] Intake/Output this shift: Total I/O In: 360 [P.O.:360] Out: 40 [Drains:40]  General appearance: alert and cooperative Resp: clear to auscultation bilaterally Cardio: regular rate and rhythm, S1, S2 normal, no murmur, click, rub or gallop GI: Soft. Incision healing well. Colostomy pink and patent. Pelvic: Incision healing well  Lab Results:   Recent Labs  07/29/13 0417 07/30/13 0459  WBC 8.9 8.6  HGB 9.2* 9.8*  HCT 27.4* 30.0*  PLT 155 157   BMET  Recent Labs  07/29/13 0417 07/30/13 0459  NA 143 143  K 3.9 4.1  CL 103 103  CO2 30 31  GLUCOSE 162* 120*  BUN 26* 17  CREATININE 1.15* 0.90  CALCIUM 7.8* 8.4   PT/INR No results found for this basename: LABPROT, INR,  in the last 72 hours  Studies/Results: No results found.  Anti-infectives: Anti-infectives   Start     Dose/Rate Route Frequency Ordered Stop   07/26/13 0817  ertapenem (INVANZ) 1 g in sodium chloride 0.9 % 50 mL IVPB     1 g 100 mL/hr over 30 Minutes Intravenous On call to O.R. 07/26/13 3546 07/26/13 0958      Assessment/Plan: s/p Procedure(s): FLEXIBLE SIGMOIDOSCOPY  ABDOMINAL PERINEAL RESECTION HYSTERECTOMY SUPRACERVICAL ABDOMINAL  SALPINGO OOPHORECTOMY COLOSTOMY Impression: Postoperative day 4, continues to improve. Has started carb modified diet. Will restart Glucophage. Continue to encourage ambulation. Continue to instruct patient on enterostomal care. Group home will also  need instruction. Will DC Foley today.  LOS: 4 days    Jamesetta So 07/30/2013

## 2013-07-30 NOTE — Progress Notes (Signed)
Physical Therapy Treatment Patient Details Name: Lovene Maret MRN: [id number] DOB: 1950/06/18 Today's Date: 07/30/2013    History of Present Illness This 74 [place] Female presents for  newly diagnoses rectal cancer.  Biopsy proven by colonoscopy, EUS shows 7cm long mass, 5cm from anal verge, one lymph node suspicious for metastatic disease.  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  Does not have abdominal pain currently.  Has undergone neoadjuvant therapy and now presents following resection of her rectal carcinoma.    PT Comments    Patient displays improved strength, balance and safety during ambulation requiring little to no assistance from therapist with help only for set up. Expecting patient will be safe to discharge home with home health PT for continued strengthening and endurance training. Patient ambulated without walker today for 30 ft with good balance, but patient sated she did not want to perform stairs as they would wear her out for the rest of the day. Patient instructed to get up with nursing at least 3x daily.           Precautions / Restrictions Precautions Precautions: Fall Precaution Comments: At falls risk due to difficulty managing walker and IV pole, requires assitance  Restrictions Weight Bearing Restrictions: No    Mobility  Bed Mobility Overal bed mobility: Independent                Transfers Overall transfer level: Modified independent Equipment used: Rolling walker (2 wheeled) Transfers: Sit to/from Stand Sit to Stand: Modified independent (Device/Increase time)            Ambulation/Gait Ambulation/Gait assistance: Modified independent (Device/Increase time) Ambulation Distance (Feet): 200 Feet Assistive device: Rolling walker (2 wheeled) Gait Pattern/deviations: WFL(Within Functional Limits)     General Gait Details: no assitance required during ambulation with and with out AD   Balance Overall balance  assessment: Modified Independent                                  Cognition Arousal/Alertness: Awake/alert Behavior During Therapy: WFL for tasks assessed/performed Overall Cognitive Status: Within Functional Limits for tasks assessed                      Exercises General Exercises - Lower Extremity Ankle Circles/Pumps: AROM;20 reps;Strengthening;Both Long Arc Quad: AROM;Strengthening;20 reps;Both        Pertinent Vitals/Pain No pain.            Time: 2025-4270 PT Time Calculation (min): 20 min  Charges:  $Gait Training: 8-22 mins                     Tempie Gibeault R Javarion Douty 07/30/2013, 1:57 PM

## 2013-07-30 NOTE — Progress Notes (Signed)
Subjective: Interval History: complains of cough but no sputum production. She denies any difficulty in breathing. No other compaints  Objective: Vital signs in last 24 hours: Temp:  [97.9 F (36.6 C)-99.4 F (37.4 C)] 98 F (36.7 C) (05/15 0524) Pulse Rate:  [71-75] 71 (05/15 0524) Resp:  [17-20] 20 (05/15 0524) BP: (131-183)/(48-110) 165/75 mmHg (05/15 0524) SpO2:  [99 %] 99 % (05/15 0524) Weight change:   Intake/Output from previous day: 05/14 0701 - 05/15 0700 In: 1260.7 [P.O.:240; I.V.:940.7; IV Piggyback:50] Out: 1833 [Urine:8300; Drains:30] Intake/Output this shift:    Generally she is alert and no apparent distress Chest is clear Heart exam:RRR no murmur Abdomen positive bowl sound Exetrimities: no edema  Lab Results:  Recent Labs  07/29/13 0417 07/30/13 0459  WBC 8.9 8.6  HGB 9.2* 9.8*  HCT 27.4* 30.0*  PLT 155 157   BMET:   Recent Labs  07/29/13 0417 07/30/13 0459  NA 143 143  K 3.9 4.1  CL 103 103  CO2 30 31  GLUCOSE 162* 120*  BUN 26* 17  CREATININE 1.15* 0.90  CALCIUM 7.8* 8.4   No results found for this basename: PTH,  in the last 72 hours Iron Studies: No results found for this basename: IRON, TIBC, TRANSFERRIN, FERRITIN,  in the last 72 hours  Studies/Results: No results found.  I have reviewed the patient's current medications.  Assessment/Plan: Problem #1 acute kidney injury: Her BUN is 17 and creatinine is 0.9 her renal function progressively seems to improving. Problem #2 hyperkalemia potassium is 4.1 has corrected Problem #3 history of diabetes Problem #4 hypertension her blood pressure is reasonably controlled Problem #5 anemia: Her hemoglobin and hematocrit is better. Problem #6 history of rectal cancer status post surgery.  Problem #7 hypophosphatemia: Her phosphorus has normalized and patient is K-phos Neuta Problem#8 Hypomagnesmia:  Plan: d/c K-Phos Neutral  D/c ivf Magnesium oxide 400 mg po once a day and can be d/c  once magnesium normalized Since her renal function has returned to her base  Line, I will sign off  Thank you   LOS: 4 days   Nahomy Limburg S Doyle Tegethoff 07/30/2013,7:43 AM

## 2013-07-30 NOTE — Progress Notes (Signed)
Patient discussed with RNCM and Dr. Arnoldo Morale. Plan return to [facility]  [phone] on Monday if remains stable. She will have a new colostomy and facility staff started training today. May need a RW at d/c. CSW and RNCM both spoke with [facility] and she supports d/c back to [facility] with [facility] - arranged by [facility].  [name]. [place], [place]

## 2013-07-31 LAB — BASIC METABOLIC PANEL
BUN: 21 mg/dL (ref 6–23)
CALCIUM: 9 mg/dL (ref 8.4–10.5)
CHLORIDE: 96 meq/L (ref 96–112)
CO2: 28 meq/L (ref 19–32)
CREATININE: 1.1 mg/dL (ref 0.50–1.10)
GFR calc Af Amer: 61 mL/min — ABNORMAL LOW (ref >90)
GFR calc non Af Amer: 53 mL/min — ABNORMAL LOW (ref >90)
Glucose, Bld: 160 mg/dL — ABNORMAL HIGH (ref 70–99)
Potassium: 4.4 mEq/L (ref 3.7–5.3)
Sodium: 139 mEq/L (ref 137–147)

## 2013-07-31 LAB — GLUCOSE, CAPILLARY
GLUCOSE-CAPILLARY: 175 mg/dL — AB (ref 70–99)
GLUCOSE-CAPILLARY: 232 mg/dL — AB (ref 70–99)
GLUCOSE-CAPILLARY: 119 mg/dL — AB (ref 70–99)
Glucose-Capillary: 186 mg/dL — ABNORMAL HIGH (ref 70–99)

## 2013-07-31 LAB — CBC
HEMATOCRIT: 31.2 % — AB (ref 36.0–46.0)
Hemoglobin: 10.1 g/dL — ABNORMAL LOW (ref 12.0–15.0)
MCH: 28.7 pg (ref 26.0–34.0)
MCHC: 32.4 g/dL (ref 30.0–36.0)
MCV: 88.6 fL (ref 78.0–100.0)
PLATELETS: 205 10*3/uL (ref 150–400)
RBC: 3.52 MIL/uL — ABNORMAL LOW (ref 3.87–5.11)
RDW: 13.9 % (ref 11.5–15.5)
WBC: 9.8 10*3/uL (ref 4.0–10.5)

## 2013-07-31 LAB — MAGNESIUM: MAGNESIUM: 1.9 mg/dL (ref 1.5–2.5)

## 2013-07-31 NOTE — Progress Notes (Signed)
5 Days Post-Op  Subjective: Mild incisional pain. He was voiding without significant difficulty.  Objective: Vital signs in last 24 hours: Temp:  [98.6 F (37 C)-98.9 F (37.2 C)] 98.6 F (37 C) (05/16 0433) Pulse Rate:  [85-91] 85 (05/16 0433) Resp:  [20] 20 (05/16 0433) BP: (154-171)/(71-84) 158/84 mmHg (05/16 0433) SpO2:  [93 %-99 %] 97 % (05/16 0740) Last BM Date: 07/28/13  Intake/Output from previous day: 05/15 0701 - 05/16 0700 In: 840 [P.O.:840] Out: 2010 [Urine:1900; Drains:110] Intake/Output this shift:    General appearance: alert, cooperative and no distress Resp: clear to auscultation bilaterally Cardio: regular rate and rhythm, S1, S2 normal, no murmur, click, rub or gallop GI: Soft. Incision healing well. Ostomy pink and patent. Perineum healing well. Bowel sounds active.  Lab Results:   Recent Labs  07/30/13 0459 07/31/13 0615  WBC 8.6 9.8  HGB 9.8* 10.1*  HCT 30.0* 31.2*  PLT 157 205   BMET  Recent Labs  07/30/13 0459 07/31/13 0615  NA 143 139  K 4.1 4.4  CL 103 96  CO2 31 28  GLUCOSE 120* 160*  BUN 17 21  CREATININE 0.90 1.10  CALCIUM 8.4 9.0   PT/INR No results found for this basename: LABPROT, INR,  in the last 72 hours  Studies/Results: No results found.  Anti-infectives: Anti-infectives   Start     Dose/Rate Route Frequency Ordered Stop   07/26/13 0817  ertapenem (INVANZ) 1 g in sodium chloride 0.9 % 50 mL IVPB     1 g 100 mL/hr over 30 Minutes Intravenous On call to O.R. 07/26/13 5615 07/26/13 0958      Assessment/Plan: s/p Procedure(s): FLEXIBLE SIGMOIDOSCOPY  ABDOMINAL PERINEAL RESECTION HYSTERECTOMY SUPRACERVICAL ABDOMINAL  SALPINGO OOPHORECTOMY COLOSTOMY Impression: Postoperative day 5. Patient started to void without difficulty. Will encourage her to continue ambulating. Continue to instruct on ostomy care. Anticipate discharge in next 24-48 hours. Will remove JP drain prior to discharge.  LOS: 5 days    Jamesetta So 07/31/2013

## 2013-08-01 LAB — GLUCOSE, CAPILLARY
GLUCOSE-CAPILLARY: 116 mg/dL — AB (ref 70–99)
Glucose-Capillary: 156 mg/dL — ABNORMAL HIGH (ref 70–99)
Glucose-Capillary: 192 mg/dL — ABNORMAL HIGH (ref 70–99)
Glucose-Capillary: 116 mg/dL — ABNORMAL HIGH (ref 70–99)

## 2013-08-01 NOTE — Progress Notes (Signed)
6 Days Post-Op  Subjective: No significant abdominal pain today.  Objective: Vital signs in last 24 hours: Temp:  [97.9 F (36.6 C)-98.7 F (37.1 C)] 98 F (36.7 C) (05/17 0425) Pulse Rate:  [76-96] 80 (05/17 0425) Resp:  [20] 20 (05/17 0425) BP: (146-165)/(79-82) 146/82 mmHg (05/17 0425) SpO2:  [94 %-99 %] 94 % (05/17 1019) Last BM Date: 07/28/13  Intake/Output from previous day: 05/16 0701 - 05/17 0700 In: 780 [P.O.:780] Out: 1610 [Urine:1550; Drains:60] Intake/Output this shift: Total I/O In: 240 [P.O.:240] Out: 200 [Urine:200]  General appearance: alert, cooperative and no distress Resp: clear to auscultation bilaterally Cardio: regular rate and rhythm, S1, S2 normal, no murmur, click, rub or gallop GI: Soft. Incision healing well. JP drain removed. Perineum healing well. Ostomy pink and patent.  Lab Results:   Recent Labs  07/30/13 0459 07/31/13 0615  WBC 8.6 9.8  HGB 9.8* 10.1*  HCT 30.0* 31.2*  PLT 157 205   BMET  Recent Labs  07/30/13 0459 07/31/13 0615  NA 143 139  K 4.1 4.4  CL 103 96  CO2 31 28  GLUCOSE 120* 160*  BUN 17 21  CREATININE 0.90 1.10  CALCIUM 8.4 9.0   PT/INR No results found for this basename: LABPROT, INR,  in the last 72 hours  Studies/Results: No results found.  Anti-infectives: Anti-infectives   Start     Dose/Rate Route Frequency Ordered Stop   07/26/13 0817  ertapenem (INVANZ) 1 g in sodium chloride 0.9 % 50 mL IVPB     1 g 100 mL/hr over 30 Minutes Intravenous On call to O.R. 07/26/13 4944 07/26/13 0958      Assessment/Plan: s/p Procedure(s): FLEXIBLE SIGMOIDOSCOPY  ABDOMINAL PERINEAL RESECTION HYSTERECTOMY SUPRACERVICAL ABDOMINAL  SALPINGO OOPHORECTOMY COLOSTOMY Impression: Continues to progress well, status post APR. JP drain has been removed. Making arrangements for discharge in next 24-48 hours. Patient will be returning to group home. Will need home health nurse for wound care and ostomy care.  LOS: 6  days    Jamesetta So 08/01/2013

## 2013-08-02 LAB — GLUCOSE, CAPILLARY
GLUCOSE-CAPILLARY: 116 mg/dL — AB (ref 70–99)
GLUCOSE-CAPILLARY: 149 mg/dL — AB (ref 70–99)

## 2013-08-02 MED ORDER — OXYCODONE-ACETAMINOPHEN 5-325 MG PO TABS: 1.0000 | ORAL_TABLET | ORAL | Status: DC | PRN

## 2013-08-02 NOTE — Progress Notes (Signed)
UR chart review completed.  

## 2013-08-02 NOTE — Clinical Social Work Note (Signed)
Pt d/c today back to [facility]. Pt and Ms. Gwynn with facility are aware and agreeable. Notified [employer], guardian by [employer]. Facility to provide transport.  Benay Pike, [place]

## 2013-08-02 NOTE — Progress Notes (Signed)
Patient Discharged back to [facility] assisted living.  Instructions and packet was given to the transporter for the facility.  He verbalizes understanding via teachback method.  Patient left the floor via w/c with staff in stable condition.

## 2013-08-02 NOTE — Discharge Summary (Signed)
Physician Discharge Summary  Patient ID: [name] MRN: [id number] DOB/AGE: [birth year] 62 y.o.  Admit date: 07/26/2013 Discharge date: 08/02/2013  Admission Diagnoses: Rectal carcinoma, mentally deficient, hypertension, morbid obesity, insulin-dependent diabetes mellitus  Discharge Diagnoses: Same Active Problems:   Rectal cancer   Discharged Condition: good  Hospital Course: Patient is a 63 year old black female who resides in a group home 2 to being mentally deficient who has undergone neoadjuvant therapy for rectal carcinoma. She is finished with her radiation therapy presented to the operating room for a low anterior resection, flexible sigmoidoscopy, possible abdominoperineal resection. She underwent surgery on 07/26/2013. Unfortunately, her tumor was too low to do a low anterior resection. Thus, she underwent abdominal perineal resection. She tolerated the surgery remarkably well. She did receive multiple units of packed red blood cells postoperatively due to 2 anemia secondary to acute blood loss. She did have an episode of non-oliguric renal failure and hyperkalemia. This did resolve with fluid hydration and Lasix. Her diet was advanced at difficulty with once her bowel function returned. Ostomy nurse was consulted. Final pathology revealed a T3, N0, M0 rectal carcinoma. She is being discharged back to the group home in good improving condition.  Treatments: surgery: Abdominoperineal resection, flexible sigmoidoscopy on 07/26/2013  Discharge Exam: Blood pressure 184/99, pulse 84, temperature 98.7 F (37.1 C), temperature source Oral, resp. rate 20, height 5' 4.17" (1.63 m), weight 105.2 kg (231 lb 14.8 oz), SpO2 98.00%. General appearance: alert, cooperative and no distress Resp: clear to auscultation bilaterally Cardio: regular rate and rhythm, S1, S2 normal, no murmur, click, rub or gallop GI: Soft. Incision healing well. Ostomy pink and patent. Perineum healing  well.  Disposition: 01-Home or Self Care     Medication List    STOP taking these medications       oxyCODONE-acetaminophen 10-325 MG per tablet  Commonly known as:  PERCOCET  Replaced by:  oxyCODONE-acetaminophen 5-325 MG per tablet      TAKE these medications       amLODipine 2.5 MG tablet  Commonly known as:  NORVASC  Take 2.5 mg by mouth daily.     aspirin EC 81 MG tablet  Take 81 mg by mouth daily.     diphenoxylate-atropine 2.5-0.025 MG per tablet  Commonly known as:  LOMOTIL  Take 1 tablet by mouth every 2 (two) hours as needed for diarrhea or loose stools.     ferrous sulfate 325 (65 FE) MG tablet  TAKE (1) TABLET BY MOUTH TWICE DAILY.     fish oil-omega-3 fatty acids 1000 MG capsule  Take 1 g by mouth 2 (two) times daily.     Fluticasone-Salmeterol 250-50 MCG/DOSE Aepb  Commonly known as:  ADVAIR  Inhale 1 puff into the lungs every 12 (twelve) hours.     LANTUS SOLOSTAR 100 UNIT/ML Solostar Pen  Generic drug:  Insulin Glargine  Inject 15 Units into the skin at bedtime.     LORATADINE-D 24HR 10-240 MG per 24 hr tablet  Generic drug:  loratadine-pseudoephedrine  Take 1 tablet by mouth daily.     metFORMIN 1000 MG tablet  Commonly known as:  GLUCOPHAGE  Take 1,000 mg by mouth 2 (two) times daily with a meal.     metoprolol succinate 50 MG 24 hr tablet  Commonly known as:  TOPROL-XL  Take 100 mg by mouth at bedtime. Take with or immediately following a meal.     montelukast 10 MG tablet  Commonly known as:  SINGULAIR  Take 10  mg by mouth at bedtime.     oxyCODONE-acetaminophen 5-325 MG per tablet  Commonly known as:  PERCOCET/ROXICET  Take 1-2 tablets by mouth every 4 (four) hours as needed for moderate pain.     pantoprazole 40 MG tablet  Commonly known as:  PROTONIX  Take 40 mg by mouth daily.     phenytoin 100 MG ER capsule  Commonly known as:  DILANTIN  Take 100 mg by mouth 2 (two) times daily.     pravastatin 40 MG tablet  Commonly  known as:  PRAVACHOL  Take 40 mg by mouth daily.     sodium chloride 0.65 % Soln nasal spray  Commonly known as:  OCEAN  Place 2 sprays into both nostrils 4 (four) times daily as needed for congestion.     triamcinolone cream 0.1 %  Commonly known as:  KENALOG  Apply 1 application topically daily. To legs           Follow-up Information   Follow up with Jamesetta So, MD. Schedule an appointment as soon as possible for a visit on 08/10/2013.   Specialty:  General Surgery   Contact information:   [address] [place] [address] [phone]       Signed: Jamesetta So 08/02/2013, 10:08 AM

## 2013-08-02 NOTE — Discharge Instructions (Signed)
Colostomy Home Guide A colostomy is an opening for stool to leave your body when a medical condition prevents it from leaving through the usual opening (rectum). During a surgery, a piece of large intestine (colon) is brought through a hole in the abdominal wall. The new opening is called a stoma or ostomy. A bag or pouch fits over the stoma to catch stool and gas. Your stool may be liquid, somewhat pasty, or formed. CARING FOR YOUR STOMA  Normally, the stoma looks a lot like the inside of your cheek: pink, red, and moist. At first it may be swollen, but this swelling will decrease within 6 weeks. Keep the skin around your stoma clean and dry. You can gently wash your stoma and the skin around your stoma in the shower with a clean, soft washcloth. If you develop any skin irritation, your caregiver may give you a stoma powder or ointment to help heal the area. Do not use any products other than those specifically given to you by your caregiver.  Your stoma should not be uncomfortable. If you notice any stinging or burning, your pouch may be leaking, and the skin around your stoma may be coming into contact with stool. This can cause skin irritation. If you notice stinging, replace your pouch with a new one and discard the old one. OSTOMY POUCHES  The pouch that fits over the ostomy can be made up of either 1 or 2 pieces. A one-piece pouch has a skin barrier piece and the pouch itself in one unit. A two-piece pouch has a skin barrier with a separate pouch that snaps on and off of the skin barrier. Either way, you should empty the pouch when it is only  to  full. Do not let more stool or gas build up. This could cause the pouch to leak. Some ostomy bags have a built-in gas release valve. Ostomy deodorizer (5 drops) can be put into the pouch to prevent odor. Some people use ostomy lubricant drops inside the pouch to help the stool slide out of the bag more easily and completely.  EMPTYING YOUR OSTOMY POUCH    You may get lessons on how to empty your pouch from a wound-ostomy nurse before you leave the hospital. Here are the basic steps:  Wash your hands with soap and water.  Sit far back on the toilet.  Put several pieces of toilet paper into the toilet water. This will prevent splashing as you empty the stool into the toilet bowl.  Unclip or unvelcro the tail end of the pouch.  Unroll the tail and empty stool into the toilet.  Clean the tail with toilet paper.  Reroll the tail, and clip or velcro it closed.  Wash your hands again. CHANGING YOUR OSTOMY POUCH  Change your ostomy pouch about every 3 to 4 days for the first 6 weeks, then every 5 to7 days. Always change the bag sooner if there is any leakage or you begin to notice any discomfort or irritation of the skin around the stoma. When possible, plan to change your ostomy pouch before eating or drinking as this will lessen the chance of stool coming out during the pouch change. A wound-ostomy nurse may teach you how to change your pouch before you leave the hospital. Here are the basic steps:  Lay out your supplies.  Wash your hands with soap and water.  Carefully remove the old pouch.  Wash the stoma and allow it to dry. Men may be  advised to shave any hair around the stoma very carefully. This will make the adhesive stick better.  Use the stoma measuring guide that comes with your pouch set to decide what size hole you will need to cut in the skin barrier piece. Choose the smallest possible size that will hold the stoma but will not touch it.  Use the guide to trace the circle on the back of the skin barrier piece. Cut out the hole.  Hold the skin barrier piece over the stoma to make sure the hole is the correct size.  Remove the adhesive paper backing from the skin barrier piece.  Squeeze stoma paste around the opening of the skin barrier piece.  Clean and dry the skin around the stoma again.  Carefully fit the skin  barrier piece over your stoma.  If you are using a two-piece pouch, snap the pouch onto the skin barrier piece.  Close the tail of the pouch.  Put your hand over the top of the skin barrier piece to help warm it for about 5 minutes, so that it conforms to your body better.  Wash your hands again. DIET TIPS   Continue to follow your usual diet.  Drink about eight 8 oz glasses of water each day.  You can prevent gas by eating slowly and chewing your food thoroughly.  If you feel concerned that you have too much gas, you can cut back on gas-producing foods, such as:  Spicy foods.  Onions and garlic.  Cruciferous vegetables (cabbage, broccoli, cauliflower, Brussels sprouts).  Beans and legumes.  Some cheeses.  Eggs.  Fish.  Bubbly (carbonated) drinks.  Chewing gum. GENERAL TIPS   You can shower with or without the bag in place.  Always keep the bag on if you are bathing or swimming.  If your bag gets wet, you can dry it with a blow-dryer set to cool.  Avoid wearing tight clothing directly over your stoma so that it does not become irritated or bleed. Tight clothing can also prevent stool from draining into the pouch.  It is helpful to always have an extra skin barrier and pouch with you when traveling. Do not leave them anywhere too warm, as parts of them can melt.  Do not let your seat belt rest on your stoma. Try to keep the seat belt either above or below your stoma, or use a tiny pillow to cushion it.  You can still participate in sports, but you should avoid activities in which there is a risk of getting hit in the abdomen.  You can still have sex. It is a good idea to empty your pouch prior to sex. Some people and their partners feel very comfortable seeing the pouch during sex. Others choose to wear lingerie or a T-shirt that covers the device. SEEK IMMEDIATE MEDICAL CARE IF:  You notice a change in the size or color of the stoma, especially if it becomes  very red, purple, black, or pale white.  You have bloody stools or bleeding from the stoma.  You have abdominal pain, nausea, vomiting, or bloating.  There is anything unusual protruding from the stoma.  You have irritation or red skin around the stoma.  No stool is passing from the stoma.  You have diarrhea (requiring more frequent than normal pouch emptying). Document Released: 03/07/2003 Document Revised: 05/27/2011 Document Reviewed: 08/01/2010 [facility] Patient Information 2014 [place], [place].

## 2013-08-05 ENCOUNTER — Other Ambulatory Visit (HOSPITAL_COMMUNITY): Payer: Self-pay | Admitting: Pulmonary Disease

## 2013-08-05 DIAGNOSIS — Z139 Encounter for screening, unspecified: Secondary | ICD-10-CM

## 2013-08-12 ENCOUNTER — Ambulatory Visit (HOSPITAL_COMMUNITY)
Admission: RE | Admit: 2013-08-12 | Discharge: 2013-08-12 | Disposition: A | Discharge status: Home / Self Care | Payer: PRIVATE HEALTH INSURANCE | Source: Ambulatory Visit | Attending: Pulmonary Disease | Admitting: Pulmonary Disease

## 2013-08-12 DIAGNOSIS — Z139 Encounter for screening, unspecified: Secondary | ICD-10-CM

## 2013-08-12 DIAGNOSIS — Z1231 Encounter for screening mammogram for malignant neoplasm of breast: Secondary | ICD-10-CM | POA: Insufficient documentation

## 2013-08-23 ENCOUNTER — Ambulatory Visit (INDEPENDENT_AMBULATORY_CARE_PROVIDER_SITE_OTHER): Payer: PRIVATE HEALTH INSURANCE | Admitting: Internal Medicine

## 2013-08-30 ENCOUNTER — Encounter (HOSPITAL_COMMUNITY): Payer: Self-pay

## 2013-08-30 ENCOUNTER — Encounter (HOSPITAL_COMMUNITY): Payer: PRIVATE HEALTH INSURANCE

## 2013-08-30 ENCOUNTER — Encounter (HOSPITAL_COMMUNITY): Payer: PRIVATE HEALTH INSURANCE | Attending: Hematology and Oncology

## 2013-08-30 VITALS — BP 142/83 | HR 80 | Temp 98.0°F | Resp 17 | Wt 215.4 lb

## 2013-08-30 DIAGNOSIS — C20 Malignant neoplasm of rectum: Secondary | ICD-10-CM | POA: Insufficient documentation

## 2013-08-30 DIAGNOSIS — D509 Iron deficiency anemia, unspecified: Secondary | ICD-10-CM

## 2013-08-30 LAB — COMPREHENSIVE METABOLIC PANEL
ALT: 8 U/L (ref 0–35)
AST: 12 U/L (ref 0–37)
Albumin: 3.1 g/dL — ABNORMAL LOW (ref 3.5–5.2)
Alkaline Phosphatase: 229 U/L — ABNORMAL HIGH (ref 39–117)
BUN: 27 mg/dL — AB (ref 6–23)
CALCIUM: 8.4 mg/dL (ref 8.4–10.5)
CO2: 27 mEq/L (ref 19–32)
Chloride: 97 mEq/L (ref 96–112)
Creatinine, Ser: 1.06 mg/dL (ref 0.50–1.10)
GFR calc non Af Amer: 55 mL/min — ABNORMAL LOW (ref >90)
GFR, EST AFRICAN AMERICAN: 64 mL/min — AB (ref >90)
Glucose, Bld: 103 mg/dL — ABNORMAL HIGH (ref 70–99)
Potassium: 5.3 mEq/L (ref 3.7–5.3)
SODIUM: 137 meq/L (ref 137–147)
TOTAL PROTEIN: 7.4 g/dL (ref 6.0–8.3)
Total Bilirubin: <0.2 mg/dL — ABNORMAL LOW (ref 0.3–1.2)

## 2013-08-30 LAB — CBC WITH DIFFERENTIAL/PLATELET
Basophils Absolute: 0 10*3/uL (ref 0.0–0.1)
Basophils Relative: 0 % (ref 0–1)
EOS ABS: 0.3 10*3/uL (ref 0.0–0.7)
EOS PCT: 4 % (ref 0–5)
HCT: 30 % — ABNORMAL LOW (ref 36.0–46.0)
Hemoglobin: 9.7 g/dL — ABNORMAL LOW (ref 12.0–15.0)
Lymphocytes Relative: 7 % — ABNORMAL LOW (ref 12–46)
Lymphs Abs: 0.5 10*3/uL — ABNORMAL LOW (ref 0.7–4.0)
MCH: 28.4 pg (ref 26.0–34.0)
MCHC: 32.3 g/dL (ref 30.0–36.0)
MCV: 87.7 fL (ref 78.0–100.0)
Monocytes Absolute: 0.5 10*3/uL (ref 0.1–1.0)
Monocytes Relative: 7 % (ref 3–12)
NEUTROS PCT: 82 % — AB (ref 43–77)
Neutro Abs: 6 10*3/uL (ref 1.7–7.7)
Platelets: 272 10*3/uL (ref 150–400)
RBC: 3.42 MIL/uL — ABNORMAL LOW (ref 3.87–5.11)
RDW: 13.2 % (ref 11.5–15.5)
WBC: 7.3 10*3/uL (ref 4.0–10.5)

## 2013-08-30 MED ORDER — CAPECITABINE 500 MG PO TABS: ORAL_TABLET | ORAL | Status: DC

## 2013-08-30 NOTE — Patient Instructions (Addendum)
[place] Discharge Instructions  RECOMMENDATIONS MADE BY THE CONSULTANT AND ANY TEST RESULTS WILL BE SENT TO YOUR REFERRING PHYSICIAN.  EXAM FINDINGS BY THE PHYSICIAN TODAY AND SIGNS OR SYMPTOMS TO REPORT TO CLINIC OR PRIMARY PHYSICIAN: Exam and findings as discussed by Dr. Barnet Glasgow.  You are doing well.  Plan is to restart Xeloda when you get it and will take it for 6 months.  Report fevers, chills, mouth sores, pain or peeling of hands and feet, etc.  MEDICATIONS PRESCRIBED:  Xeloda - 500mg  take 2 capsules twice daily for 14 days then off for 7 days.  INSTRUCTIONS/FOLLOW-UP: Follow-up in 8 weeks with labs and office visit. Thank you for choosing [facility] to provide your oncology and hematology care.  To afford each patient quality time with our providers, please arrive at least 15 minutes before your scheduled appointment time.  With your help, our goal is to use those 15 minutes to complete the necessary work-up to ensure our physicians have the information they need to help with your evaluation and healthcare recommendations.    Effective January 1st, 2014, we ask that you re-schedule your appointment with our physicians should you arrive 10 or more minutes late for your appointment.  We strive to give you quality time with our providers, and arriving late affects you and other patients whose appointments are after yours.    Again, thank you for choosing [facility].  Our hope is that these requests will decrease the amount of time that you wait before being seen by our physicians.       _____________________________________________________________  Should you have questions after your visit to [facility], please contact our office at [phone] between the hours of 8:30 a.m. and 5:00 p.m.  Voicemails left after 4:30 p.m. will not be returned until the following business day.  For prescription refill requests, have your  pharmacy contact our office with your prescription refill request.

## 2013-08-30 NOTE — Progress Notes (Signed)
LABS DRAWN FOR CBC/DIFF, CMP, CEA

## 2013-08-30 NOTE — Progress Notes (Signed)
[facility] [place]  OFFICE PROGRESS NOTE  [name], MD [place] [address]  DIAGNOSIS: Rectal cancer - Plan: CBC with Differential, Comprehensive metabolic panel, CEA, CBC with Differential, Comprehensive metabolic panel, CEA  Chief Complaint  Patient presents with  . Rectal Cancer    CURRENT THERAPY: Combined modality therapy with Xeloda plus radiation treatment from 03/25/2013 through 05/04/2013 followed by abdominal perineal resection performed on 07/27/2010 at which pT3 N0, grade 2 disease was found.  INTERVAL HISTORY: [name] 63 y.o. female returns for followup of carcinoma rectum, locally advanced, status post combined modality therapy with Xeloda plus radiotherapy followed by abdominal perineal resection on 07/26/2013.  As always she has a smile on her face. She denies any abdominal pain, melena, hematochezia, nausea, vomiting, lower extremity swelling or redness, cough, wheezing, abdominal distention, and does have two aids that deal with her colostomy care. She denies any fever, night sweats, cough, wheezing, skin rash, peripheral paresthesias, headache, or seizures. She also denies any urinary incontinence.  MEDICAL HISTORY: Past Medical History  Diagnosis Date  . Hypertension   . Diabetes mellitus     years  . Asthma   . Depression   . Mental retardation   . History of recurrent UTIs   . Shortness of breath     with exertion  . Cancer     Rectal   . Arthritis   . Gout   . Seizures     more than 4 yrs since last seizure. UNknown etiology    INTERIM HISTORY: has DIABETES MELLITUS, TYPE II, UNCONTROLLED; HYPERLIPIDEMIA; GOUT NOS; UNSPECIFIED DEFICIENCY ANEMIA; ANEMIA; DEPRESSION; RETARDATION, MENTAL NOS; HYPERTENSION; SINUS TACHYCARDIA; ALLERGIC RHINITIS; ASTHMA; ASTHMA, WITH ACUTE EXACERBATION; DISEASE, PANCREAS NOS; INTERTRIGO, CANDIDAL; ARTHRITIS; SEIZURE DISORDER; UNSPECIFIED SLEEP  DISTURBANCE; FLANK PAIN, LEFT; LIVER FUNCTION TESTS, ABNORMAL; MIGRAINES, HX OF; Elevated liver enzymes; Helicobacter pylori gastritis; and Rectal cancer on her problem list.     Rectal cancer    02/18/2013  Initial Diagnosis  Rectal cancer    03/25/2013 - 05/05/2013  Radiation Therapy  Pelvis treatment from 1/8- 2/12 with rectal boost from 2/13- 05/05/2013.    03/25/2013 - 05/05/2013  Chemotherapy  Xeloda 1500 mg BID 5 days/week with radiation therapy    ALLERGIES:  has No Known Allergies.  MEDICATIONS: has a current medication list which includes the following prescription(s): amlodipine, aspirin ec, diphenoxylate-atropine, ferrous sulfate, fish oil-omega-3 fatty acids, fluticasone-salmeterol, insulin glargine, loratadine-pseudoephedrine, metformin, metoprolol succinate, montelukast, pantoprazole, phenytoin, pravastatin, sodium chloride, triamcinolone cream, acetaminophen, capecitabine, ibuprofen, and oxycodone-acetaminophen.  SURGICAL HISTORY:  Past Surgical History  Procedure Laterality Date  . Abdominal hysterectomy    . Multiple extractions with alveoloplasty N/A 11/23/2012    Procedure: MULTIPLE EXTRACION 1, 2, 4, 5, 6, 7, 8, 9, 10, 11, 12, 13, 14, 17, 18, 20, 23, 24, 25, 26, 28, 29, 32 WITH ALVEOLOPLASTY, REMOVE BILATERAL TORI;  Surgeon: Gae Bon, DDS;  Location: [facility];  Service: Oral Surgery;  Laterality: N/A;  . Colonoscopy with esophagogastroduodenoscopy (egd) N/A 01/14/2013    Procedure: COLONOSCOPY WITH ESOPHAGOGASTRODUODENOSCOPY (EGD);  Surgeon: Rogene Houston, MD;  [facility];  Service: Endoscopy;  Laterality: N/A;  250-moved to 315 Ann to notify pt  . Colonoscopy N/A 02/04/2013    Procedure: COLONOSCOPY;  Surgeon: Rogene Houston, MD;  [facility];  Service: Endoscopy;  Laterality: N/A;  225  . Eus N/A 02/18/2013    Procedure:  LOWER ENDOSCOPIC ULTRASOUND (EUS);  Surgeon: Milus Banister, MD;  Location: [facility];  Service: Endoscopy;  Laterality: N/A;    . Flexible sigmoidoscopy N/A 07/26/2013    Procedure: FLEXIBLE SIGMOIDOSCOPY;  Surgeon: Jamesetta So, MD;  Location: [facility];  Service: General;  Laterality: N/A;  . Abdominal perineal bowel resection N/A 07/26/2013    Procedure:  ABDOMINAL PERINEAL RESECTION;  Surgeon: Jamesetta So, MD;  Location: [facility];  Service: General;  Laterality: N/A;  . Supracervical abdominal hysterectomy N/A 07/26/2013    Procedure: HYSTERECTOMY SUPRACERVICAL ABDOMINAL ;  Surgeon: Jamesetta So, MD;  Location: [facility];  Service: General;  Laterality: N/A;  . Salpingoophorectomy Bilateral 07/26/2013    Procedure: SALPINGO OOPHORECTOMY;  Surgeon: Jamesetta So, MD;  Location: [facility];  Service: General;  Laterality: Bilateral;  . Colostomy Left 07/26/2013    Procedure: COLOSTOMY;  Surgeon: Jamesetta So, MD;  Location: [facility];  Service: General;  Laterality: Left;    FAMILY HISTORY: family history is not on file.  SOCIAL HISTORY:  reports that she quit smoking about 5 years ago. Her smoking use included Cigarettes. She smoked 0.00 packs per day. She has never used smokeless tobacco. She reports that she does not drink alcohol or use illicit drugs.  REVIEW OF SYSTEMS:  Other than that discussed above is noncontributory.  PHYSICAL EXAMINATION: ECOG PERFORMANCE STATUS: 1 - Symptomatic but completely ambulatory  Blood pressure 142/83, pulse 80, temperature 98 F (36.7 C), temperature source Oral, resp. rate 17, weight 215 lb 6.4 oz (97.705 kg).  GENERAL:alert, no distress and comfortable. Moderately obese.  SKIN: skin color, texture, turgor are normal, no rashes or significant lesions EYES: PERLA; Conjunctiva are pink and non-injected, sclera clear SINUSES: No redness or tenderness over maxillary or ethmoid sinuses OROPHARYNX:no exudate, no erythema on lips, buccal mucosa, or tongue. NECK: supple, thyroid normal size, non-tender, without nodularity. No masses CHEST: Normal AP diameter with no breast  masses. LYMPH:  no palpable lymphadenopathy in the cervical, axillary or inguinal LUNGS: clear to auscultation and percussion with normal breathing effort HEART: regular rate & rhythm and no murmurs. ABDOMEN:abdomen soft, non-tender and normal bowel sounds. Colostomy in place with no evidence of stomal erythema, purulence, or bleeding. MUSCULOSKELETAL:no cyanosis of digits and no clubbing. Range of motion normal.  NEURO: alert & oriented x 3 with fluent speech, no focal motor/sensory deficits   LABORATORY DATA: Office Visit on 08/30/2013  Component Date Value Ref Range Status  . WBC 08/30/2013 7.3  4.0 - 10.5 K/uL Final  . RBC 08/30/2013 3.42* 3.87 - 5.11 MIL/uL Final  . Hemoglobin 08/30/2013 9.7* 12.0 - 15.0 g/dL Final  . HCT 08/30/2013 30.0* 36.0 - 46.0 % Final  . MCV 08/30/2013 87.7  78.0 - 100.0 fL Final  . MCH 08/30/2013 28.4  26.0 - 34.0 pg Final  . MCHC 08/30/2013 32.3  30.0 - 36.0 g/dL Final  . RDW 08/30/2013 13.2  11.5 - 15.5 % Final  . Platelets 08/30/2013 272  150 - 400 K/uL Final  . Neutrophils Relative % 08/30/2013 82* 43 - 77 % Final  . Neutro Abs 08/30/2013 6.0  1.7 - 7.7 K/uL Final  . Lymphocytes Relative 08/30/2013 7* 12 - 46 % Final  . Lymphs Abs 08/30/2013 0.5* 0.7 - 4.0 K/uL Final  . Monocytes Relative 08/30/2013 7  3 - 12 % Final  . Monocytes Absolute 08/30/2013 0.5  0.1 - 1.0 K/uL Final  . Eosinophils Relative 08/30/2013 4  0 - 5 %  Final  . Eosinophils Absolute 08/30/2013 0.3  0.0 - 0.7 K/uL Final  . Basophils Relative 08/30/2013 0  0 - 1 % Final  . Basophils Absolute 08/30/2013 0.0  0.0 - 0.1 K/uL Final  . Sodium 08/30/2013 137  137 - 147 mEq/L Final  . Potassium 08/30/2013 5.3  3.7 - 5.3 mEq/L Final  . Chloride 08/30/2013 97  96 - 112 mEq/L Final  . CO2 08/30/2013 27  19 - 32 mEq/L Final  . Glucose, Bld 08/30/2013 103* 70 - 99 mg/dL Final  . BUN 08/30/2013 27* 6 - 23 mg/dL Final  . Creatinine, Ser 08/30/2013 1.06  0.50 - 1.10 mg/dL Final  . Calcium  08/30/2013 8.4  8.4 - 10.5 mg/dL Final  . Total Protein 08/30/2013 7.4  6.0 - 8.3 g/dL Final  . Albumin 08/30/2013 3.1* 3.5 - 5.2 g/dL Final  . AST 08/30/2013 12  0 - 37 U/L Final  . ALT 08/30/2013 8  0 - 35 U/L Final  . Alkaline Phosphatase 08/30/2013 229* 39 - 117 U/L Final  . Total Bilirubin 08/30/2013 <0.2* 0.3 - 1.2 mg/dL Final  . GFR calc non Af Amer 08/30/2013 55* >90 mL/min Final  . GFR calc Af Amer 08/30/2013 64* >90 mL/min Final   Comment: (NOTE)                          The eGFR has been calculated using the CKD EPI equation.                          This calculation has not been validated in all clinical situations.                          eGFR's persistently <90 mL/min signify possible Chronic Kidney                          Disease.  Admission on 07/26/2013, Discharged on 08/02/2013  No results displayed because visit has over 200 results.      PATHOLOGY:  FINAL for DELANE, STALLING ([id number]) Patient: HAYLEIGH, BAWA Collected: 07/26/2013 Client: [facility] Accession: [id number] Received: 07/27/2013 Aviva Signs DOB: 1950-06-21 Age: 60 Gender: F Reported: 07/28/2013 [address] Patient Ph: [phone] MRN #: [id number] Linna Hoff [place] [address] Visit #: [id number] Chart #: Phone: [phone] Fax: CC: REPORT OF SURGICAL PATHOLOGY ADDITIONAL INFORMATION: 2. Mismatch Repair (MMR) Protein Immunohistochemistry (IHC) IHC Expression Result: MLH1: Preserved nuclear expression (greater 50% tumor expression) MSH2: Preserved nuclear expression (greater 50% tumor expression) MSH6: Preserved nuclear expression (greater 50% tumor expression) PMS2: Preserved nuclear expression (greater 50% tumor expression) * Internal control demonstrates intact nuclear expression Interpretation: NORMAL There is preserved expression of the major and minor MMR proteins. There is a very low probability that microsatellite instability (MSI) is present. However, certain clinically  significant MMR protein mutations may result in preservation of nuclear expression. It is recommended that the preservation of protein expression be correlated with molecular based MSI testing. References: 1. Guidelines on Genetic Evaluation and Management of Lynch Syndrome: A Consensus Statement by the [place] Multi-Society Task Force on Colorectal Cancer Gae Dry. Sherlie Ban , MD, and others . Am Nicki [place] 2014; [phone]; doi: 10.1038/ajg.2014.186; published online 06 October 2012 2. Outcomes of screening endometrial cancer patients for Lynch syndrome by patient-administered checklist. Olena Heckle MS, and others. Gynecol Oncol 2013;131(3):619-623. Aldona Bar MD  Pathologist, Electronic Signature ( Signed 08/03/2013) FINAL DIAGNOSIS Diagnosis 1. Uterus, ovaries and fallopian tubes - LEIOMYOMATA. - ENDOMETRIUM: BENIGN ATROPHIC ENDOMETRIUM, NO ATYPIA, HYPERPLASIA OR MALIGNANCY. 1 of 4 FINAL for Armbrust, Sheri [phone]) Diagnosis(continued) - BILATERAL FALLOPIAN TUBES AND OVARIES: NO PATHOLOGIC ABNORMALITIES. 2. Colon, segmental resection for tumor, rectum - RESIDUAL INVASIVE ADENOCARCINOMA INVADING INTO AND THROUGH THE MUSCULARIS PROPRIA INTO PERIRECTAL SOFT TISSUE. - NO ANGIOLYMPHATIC INVASION IDENTIFIED. - FIFTEEN LYMPH NODES, NEGATIVE FOR METASTATIC CARCINOMA (0/15). - RESECTION MARGINS, NEGATIVE FOR ATYPIA OR MALIGNANCY. PLEASE SEE ONCOLOGY TEMPLATE FOR DETAILS. Microscopic Comment 2. RECTUM (INCLUDING TRANS-ANAL RESECTION): Specimen: Rectum including trans-anal resection. Procedure: Segmental resection Tumor site: Mid to distal rectum, 4 cm above dentate line Specimen integrity: Intact. Macroscopic intactness of mesorectum: Incomplete disrupted. Macroscopic tumor perforation: No. Invasive tumor: Maximum size: 4.5 cm, gross measurement. Histologic type(s): Invasive adenocarcinoma. Histologic grade and differentiation: G2 Type of polyp in which invasive carcinoma  arose: Not identified. Microscopic extension of invasive tumor: Invading through the muscularis propria into pericolonic fatty tissue. Lymph-Vascular invasion: Not identified. Peri-neural invasion: Not identified. Tumor deposit(s) (discontinuous extramural extension): N/A Resection margins: Negative Proximal margin: 10 cm Distal margin: 7 cm Circumferential (radial) (posterior ascending, posterior descending; lateral and posterior mid-rectum; and entire lower 1/3 rectum): 1 mm from the inked perirectal soft tissue margin. Treatment effect (neo-adjuvant therapy): Present, moderate response 1 Additional polyp(s): N/A Non-neoplastic findings: Post treatment effect. Lymph nodes: number examined - 15; number positive: - 0 Pathologic Staging: ypT3, ypN0, pMX Ancillary studies: MMR stains will be performed and an addendum report will follow. Aldona Bar MD Pathologist, Electronic Signature (Case signed 07/28/2013) Specimen Gross and Clinical Information Specimen(s) Obtained: 1. Uterus, ovaries and fallopian tubes 2. Colon, segmental resection for tumor, rectum Specimen Clinical Information 2. Pre-op: malignant neoplasm of rectum; Post-op: uterine fibroids, malignant neoplasm of rectum 2 of 4 FINAL for Rotenberry, Sumaya ([id number]) Gross 1. Specimen: Supracervical hysterectomy, with attached left tube and ovary and separate right tube and ovary. Specimen integrity (intact/incised/disrupted): Intact. Size and shape: The uterus is distorted, with an overall measurement of 16 x 12 x 8.5 cm. Weight: 711 grams without adnexa. Serosa: Tan-pink to hyperemic with scattered granularity, and there are several well-defined subserosal nodules, which predominantly have tan-white whorled cut surfaces, the largest also having a diffusely calcified cut surface. Cervix: Not included. Endometrium: The endometrial cavity is distorted, 3.6 x 0.9 cm with tan to hyperemic, smooth,  flattened endometrium. Myometrium: Tan-pink and distorted by several well-defined intramural nodules, which are up to 9 cm in diameter and have tan-white whorled cut surfaces. There are no areas of hemorrhage or necrosis. Right adnexa: The right tube is a 4.6 x 0.5 cm fimbriated segment with a hyperemic serosa with scattered adhesions and unremarkable cut surfaces. The right ovary is 3.2 x 1.7 x 1 cm, with a tan to hyperemic wrinkled exterior and unremarkable cut surfaces. Left adnexa: The attached left tube is a 5.3 cm in length and up to 0.8 cm in diameter fimbriated segment, which has a tan-pink smooth serosa and unremarkable cut surfaces. The left ovary is 2.8 x 1.9 x 1.2 cm, with a tan to hyperemic focally wrinkled exterior and unremarkable cut surfaces. Block Summary: A, B = full thickness endomyometrium. C = sections of subserosal and intramural nodules. D = sections of right tube and ovary. E = distal end of right tube. F = sections of left tube and ovary. G = distal end of left tube. Total, seven blocks. 2. Specimen: Rectum including  anal skin, consistent with abdominal perineal resection. Clinically identified as rectum. Specimen integrity: There are a few disrupted areas over the distal half. Specimen length: 21 cm, which includes 3 cm of anal skin. Mesorectal intactness: Incomplete, disrupted. Tumor location: Mid to distal rectum, 4 cm above the dentate line. Tumor size: 4 cm in length, 4.5 cm in width, tan-red, firm, sessile, essentially ulcerated mass. Percent of bowel circumference involved: 80% Tumor distance to margins: Proximal: 10 cm Distal: 7 cm Radial: Beneath the area of ulcerated mucosa, the wall has tan-white dense tissue up to 1.1 cm thick, and muscularis is ill-defined. This dense tissue comes within 0.3 cm of the perirectal soft tissue margin. Macroscopic extent of tumor invasion: The portion of dense tissue below the area of ulcerated mass involves the  entire thickness of the muscularis and areas of surrounding fat. Total presumed lymph nodes: Fifteen possible lymph nodes ranging from 0.1 to 0.6 cm. Extramural satellite tumor nodules: None. Mucosal polyp(s): None. Additional findings: None. Block summary: A = proximal margin. B, C = shave of distal (anal) margin. D - G = mass and nearest perirectal soft tissue margin. H - K = mass. L = mass and adjacent mucosa.    Urinalysis    Component Value Date/Time   COLORURINE YELLOW 07/27/2013 0830   APPEARANCEUR CLOUDY* 07/27/2013 0830   LABSPEC >1.030* 07/27/2013 0830   PHURINE 5.5 07/27/2013 0830   GLUCOSEU NEGATIVE 07/27/2013 0830   HGBUR LARGE* 07/27/2013 0830   BILIRUBINUR NEGATIVE 07/27/2013 0830   KETONESUR NEGATIVE 07/27/2013 0830   PROTEINUR 100* 07/27/2013 0830   UROBILINOGEN 0.2 07/27/2013 0830   NITRITE NEGATIVE 07/27/2013 0830   LEUKOCYTESUR SMALL* 07/27/2013 0830    RADIOGRAPHIC STUDIES: Mm Digital Screening Bilateral  08/16/2013   CLINICAL DATA:  Screening.  EXAM: DIGITAL SCREENING BILATERAL MAMMOGRAM WITH CAD  COMPARISON:  Previous exam(s).  ACR Breast Density Category b: There are scattered areas of fibroglandular density.  FINDINGS: There are no findings suspicious for malignancy. Images were processed with CAD.  IMPRESSION: No mammographic evidence of malignancy. A result letter of this screening mammogram will be mailed directly to the patient.  RECOMMENDATION: Screening mammogram in one year. (Code:SM-B-01Y)  BI-RADS CATEGORY  1: Negative.   Electronically Signed   By: Lillia Mountain M.D.   On: 08/16/2013 08:41    ASSESSMENT: 1. Locally advanced adenocarcinoma of the rectum, tolerating combined modality therapy well. Radiation therapy completed on 05/04/2013 which was also the last day she received Xeloda, excellent tolerance, status post APR on 07/26/2013 with marked cytoreduction documented as evidenced by no lymph node involvement. pT3 tumor was found  2. Iron deficiency, on iron  supplements,  3. Diabetes mellitus, type II, insulin requiring, controlled.  4. Hypertension, on treatment.  5. Asthma, on treatment.  6. Mental retardation, social worker power of attorney is Melissa.  7. Past history of Helicobacter pylori gastritis, successfully treated.  8. Allergic rhinitis, not symptomatic   PLAN:  #1. Xeloda 1000 mg twice a day with food for 2 weeks on one week off the next 6 months. #2. Followup in 2 months with CBC, chem profile, and CEA.   All questions were answered. The patient knows to call the clinic with any problems, questions or concerns. We can certainly see the patient much sooner if necessary.   I spent 25 minutes counseling the patient face to face. The total time spent in the appointment was 30 minutes.    Doroteo Bradford, MD 08/30/2013 11:14 AM  DISCLAIMER:  This note was dictated with voice recognition software.  Similar sounding words can inadvertently be transcribed inaccurately and may not be corrected upon review.

## 2013-08-30 NOTE — Progress Notes (Signed)
Lab draw

## 2013-08-31 LAB — CEA: CEA: 1.7 ng/mL (ref 0.0–5.0)

## 2013-09-06 ENCOUNTER — Encounter (INDEPENDENT_AMBULATORY_CARE_PROVIDER_SITE_OTHER): Payer: Self-pay | Admitting: Internal Medicine

## 2013-09-06 ENCOUNTER — Ambulatory Visit (INDEPENDENT_AMBULATORY_CARE_PROVIDER_SITE_OTHER): Payer: PRIVATE HEALTH INSURANCE | Admitting: Internal Medicine

## 2013-09-06 VITALS — BP 142/62 | HR 84 | Temp 97.7°F | Ht 64.0 in | Wt 214.7 lb

## 2013-09-06 DIAGNOSIS — C20 Malignant neoplasm of rectum: Secondary | ICD-10-CM

## 2013-09-06 DIAGNOSIS — R748 Abnormal levels of other serum enzymes: Secondary | ICD-10-CM

## 2013-09-06 NOTE — Progress Notes (Signed)
Subjective:     Patient ID: [name], female   DOB: 20-Dec-1950, 63 y.o.   MRN: [id number]  HPI Here today for f/u. She underwent surgery last month by Dr. Arnoldo Morale for a rectal carcinoma and hysterectomy for a fibroid uterus.   Rceivedt chemoradiation with Xeloda from 03/25/2013- 05/04/2013  She tells me today she is doing good. She denies any rectal bleeding.She is followed by Dr Arnoldo Morale for her surgery and is followed by the [facility]. Her appetite is good. No weight loss. No abdominal pain. Her colostomy is usually emptied about twice a day.  Hepatitis B and C markers are negative. Hx of elevated liver enzymes.  CBC    Component Value Date/Time   WBC 7.3 08/30/2013 1028   RBC 3.42* 08/30/2013 1028   RBC 3.84* 11/07/2009 1649   HGB 9.7* 08/30/2013 1028   HCT 30.0* 08/30/2013 1028   PLT 272 08/30/2013 1028   MCV 87.7 08/30/2013 1028   MCH 28.4 08/30/2013 1028   MCHC 32.3 08/30/2013 1028   RDW 13.2 08/30/2013 1028   LYMPHSABS 0.5* 08/30/2013 1028   MONOABS 0.5 08/30/2013 1028   EOSABS 0.3 08/30/2013 1028   BASOSABS 0.0 08/30/2013 1028   CMP     Component Value Date/Time   NA 137 08/30/2013 1028   K 5.3 08/30/2013 1028   CL 97 08/30/2013 1028   CO2 27 08/30/2013 1028   GLUCOSE 103* 08/30/2013 1028   BUN 27* 08/30/2013 1028   CREATININE 1.06 08/30/2013 1028   CREATININE 1.11* 02/08/2013 1451   CALCIUM 8.4 08/30/2013 1028   PROT 7.4 08/30/2013 1028   ALBUMIN 3.1* 08/30/2013 1028   AST 12 08/30/2013 1028   ALT 8 08/30/2013 1028   ALKPHOS 229* 08/30/2013 1028   BILITOT <0.2* 08/30/2013 1028   GFRNONAA 55* 08/30/2013 1028   GFRNONAA 54* 02/08/2013 1451   GFRAA 64* 08/30/2013 1028   GFRAA 62 02/08/2013 1451   08/30/2013 CEA: 1.7     07/2013 Preop Diagnosis: Rectal carcinoma, fibroid uterus  Postop Diagnosis: Same  Procedure: Flexible sigmoidoscopy, abdominoperineal resection, total abdominal hysterectomy with bilateral salpingo-oophorectomy    02/04/2013 Colonoscopy : Heme positive  stools, anemia: Findings:  Very poor prep resulting in incomplete exam to hepatic flexure.  Few scattered diverticula at sigmoid and descending colon.  Large ulcerated mass at proximal rectum involving more than 50% of the circumference. Multiple biopsies taken.  Normal mucosa of the distal rectum and anorectal junction. 01/14/2013 WNU:[id number]:  Focal antral gastritis otherwise normal EGD.       Review of Systems Past Medical History  Diagnosis Date  . Hypertension   . Diabetes mellitus     years  . Asthma   . Depression   . Mental retardation   . History of recurrent UTIs   . Shortness of breath     with exertion  . Cancer     Rectal   . Arthritis   . Gout   . Seizures     more than 4 yrs since last seizure. UNknown etiology  . Rectal cancer     Past Surgical History  Procedure Laterality Date  . Abdominal hysterectomy    . Multiple extractions with alveoloplasty N/A 11/23/2012    Procedure: MULTIPLE EXTRACION 1, 2, 4, 5, 6, 7, 8, 9, 10, 11, 12, 13, 14, 17, 18, 20, 23, 24, 25, 26, 28, 29, 32 WITH ALVEOLOPLASTY, REMOVE BILATERAL TORI;  Surgeon: Gae Bon, DDS;  Location: [place];  Service: Oral  Surgery;  Laterality: N/A;  . Colonoscopy with esophagogastroduodenoscopy (egd) N/A 01/14/2013    Procedure: COLONOSCOPY WITH ESOPHAGOGASTRODUODENOSCOPY (EGD);  Surgeon: Rogene Houston, MD;  Location: [facility];  Service: Endoscopy;  Laterality: N/A;  250-moved to 315 Ann to notify pt  . Colonoscopy N/A 02/04/2013    Procedure: COLONOSCOPY;  Surgeon: Rogene Houston, MD;  Location: [facility];  Service: Endoscopy;  Laterality: N/A;  225  . Eus N/A 02/18/2013    Procedure: LOWER ENDOSCOPIC ULTRASOUND (EUS);  Surgeon: Milus Banister, MD;  Location: [facility];  Service: Endoscopy;  Laterality: N/A;  . Flexible sigmoidoscopy N/A 07/26/2013    Procedure: FLEXIBLE SIGMOIDOSCOPY;  Surgeon: Jamesetta So, MD;  Location: [facility];  Service: General;  Laterality: N/A;  .  Abdominal perineal bowel resection N/A 07/26/2013    Procedure:  ABDOMINAL PERINEAL RESECTION;  Surgeon: Jamesetta So, MD;  Location: [facility];  Service: General;  Laterality: N/A;  . Supracervical abdominal hysterectomy N/A 07/26/2013    Procedure: HYSTERECTOMY SUPRACERVICAL ABDOMINAL ;  Surgeon: Jamesetta So, MD;  Location: [facility];  Service: General;  Laterality: N/A;  . Salpingoophorectomy Bilateral 07/26/2013    Procedure: SALPINGO OOPHORECTOMY;  Surgeon: Jamesetta So, MD;  Location: [facility];  Service: General;  Laterality: Bilateral;  . Colostomy Left 07/26/2013    Procedure: COLOSTOMY;  Surgeon: Jamesetta So, MD;  Location: [facility];  Service: General;  Laterality: Left;    No Known Allergies  Current Outpatient Prescriptions on File Prior to Visit  Medication Sig Dispense Refill  . acetaminophen (TYLENOL) 650 MG CR tablet Take 650 mg by mouth every 8 (eight) hours as needed for pain.      [name] amLODipine (NORVASC) 2.5 MG tablet Take 2.5 mg by mouth daily.      [name] aspirin EC 81 MG tablet Take 81 mg by mouth daily.      . capecitabine (XELODA) 500 MG tablet Take 2 tablets twice a day with food for 14 days on, rest 7 days, and repeat for a total of 6 months.  56 tablet  8  . diphenoxylate-atropine (LOMOTIL) 2.5-0.025 MG per tablet Take 1 tablet by mouth every 2 (two) hours as needed for diarrhea or loose stools.      . ferrous sulfate 325 (65 FE) MG tablet TAKE (1) TABLET BY MOUTH TWICE DAILY.  180 tablet  1  . fish oil-omega-3 fatty acids 1000 MG capsule Take 1 g by mouth 2 (two) times daily.      . Fluticasone-Salmeterol (ADVAIR) 250-50 MCG/DOSE AEPB Inhale 1 puff into the lungs every 12 (twelve) hours.      [name] ibuprofen (ADVIL,MOTRIN) 600 MG tablet Take 1 tablet by mouth as needed.      . Insulin Glargine (LANTUS SOLOSTAR) 100 UNIT/ML SOPN Inject 15 Units into the skin at bedtime.       [name] loratadine-pseudoephedrine (LORATADINE-D 24HR) 10-240 MG per 24 hr tablet Take 1 tablet by mouth daily.       . metFORMIN (GLUCOPHAGE) 1000 MG tablet Take 1,000 mg by mouth 2 (two) times daily with a meal.      . metoprolol succinate (TOPROL-XL) 50 MG 24 hr tablet Take 100 mg by mouth at bedtime. Take with or immediately following a meal.      . montelukast (SINGULAIR) 10 MG tablet Take 10 mg by mouth at bedtime.      [name] oxyCODONE-acetaminophen (PERCOCET/ROXICET) 5-325 MG per tablet Take 1-2 tablets by mouth every 4 (  four) hours as needed for moderate pain.  40 tablet  0  . pantoprazole (PROTONIX) 40 MG tablet Take 40 mg by mouth daily.      . phenytoin (DILANTIN) 100 MG ER capsule Take 100 mg by mouth 2 (two) times daily.      . pravastatin (PRAVACHOL) 40 MG tablet Take 40 mg by mouth daily.      . sodium chloride (OCEAN) 0.65 % SOLN nasal spray Place 2 sprays into both nostrils 4 (four) times daily as needed for congestion.      . triamcinolone cream (KENALOG) 0.1 % Apply 1 application topically daily. To legs       No current facility-administered medications on file prior to visit.        Objective:   Physical Exam  Filed Vitals:   09/06/13 0941  BP: 142/62  Pulse: 84  Temp: 97.7 F (36.5 C)  Height: 5\' 4"  (1.626 m)  Weight: 214 lb 11.2 oz (97.387 kg)   Alert and oriented. Skin warm and dry. Oral mucosa is moist.   . Sclera anicteric, conjunctivae is pink. Thyroid not enlarged. No cervical lymphadenopathy. Lungs clear. Heart regular rate and rhythm.  Abdomen is soft. Bowel sounds are positive. No hepatomegaly. No abdominal masses felt. No tenderness.  No edema to lower extremities. Colostomy bag in place with brown stool.      Assessment:     Hx of rectal cancer. Underwent a peritoneal resection and hysterectomy last month. Colostomy in place Seems to be doing well.     Plan:     OV in 77months with a CBC and Hepatic function with Dr Laural Golden. I have asked Butch Penny to place patient on Dr. Olevia Perches schedule for 4 months.

## 2013-09-06 NOTE — Patient Instructions (Signed)
OV in 4 months with Dr Laural Golden.  CBC, Hepatic function

## 2013-09-08 ENCOUNTER — Telehealth (INDEPENDENT_AMBULATORY_CARE_PROVIDER_SITE_OTHER): Payer: Self-pay | Admitting: *Deleted

## 2013-09-08 DIAGNOSIS — C20 Malignant neoplasm of rectum: Secondary | ICD-10-CM

## 2013-09-08 NOTE — Telephone Encounter (Signed)
.  Per [name] patient to have labs drawn in October.

## 2013-09-21 ENCOUNTER — Other Ambulatory Visit (HOSPITAL_COMMUNITY): Payer: Self-pay | Admitting: Hematology and Oncology

## 2013-10-19 ENCOUNTER — Other Ambulatory Visit (HOSPITAL_COMMUNITY): Payer: Self-pay | Admitting: Pulmonary Disease

## 2013-10-19 DIAGNOSIS — Z780 Asymptomatic menopausal state: Secondary | ICD-10-CM

## 2013-10-25 ENCOUNTER — Encounter (HOSPITAL_COMMUNITY): Payer: Self-pay

## 2013-10-25 ENCOUNTER — Ambulatory Visit (HOSPITAL_COMMUNITY)
Admission: RE | Admit: 2013-10-25 | Discharge: 2013-10-25 | Disposition: A | Discharge status: Home / Self Care | Payer: PRIVATE HEALTH INSURANCE | Source: Ambulatory Visit | Attending: Pulmonary Disease | Admitting: Pulmonary Disease

## 2013-10-25 ENCOUNTER — Encounter (HOSPITAL_BASED_OUTPATIENT_CLINIC_OR_DEPARTMENT_OTHER): Payer: PRIVATE HEALTH INSURANCE

## 2013-10-25 ENCOUNTER — Encounter (HOSPITAL_COMMUNITY): Payer: PRIVATE HEALTH INSURANCE | Attending: Hematology and Oncology

## 2013-10-25 VITALS — BP 137/69 | HR 76 | Temp 97.5°F | Resp 18 | Wt 216.5 lb

## 2013-10-25 DIAGNOSIS — C20 Malignant neoplasm of rectum: Secondary | ICD-10-CM

## 2013-10-25 DIAGNOSIS — Z780 Asymptomatic menopausal state: Secondary | ICD-10-CM | POA: Diagnosis not present

## 2013-10-25 DIAGNOSIS — E1165 Type 2 diabetes mellitus with hyperglycemia: Secondary | ICD-10-CM

## 2013-10-25 DIAGNOSIS — IMO0001 Reserved for inherently not codable concepts without codable children: Secondary | ICD-10-CM

## 2013-10-25 LAB — COMPREHENSIVE METABOLIC PANEL
ALT: 12 U/L (ref 0–35)
AST: 20 U/L (ref 0–37)
Albumin: 3.6 g/dL (ref 3.5–5.2)
Alkaline Phosphatase: 254 U/L — ABNORMAL HIGH (ref 39–117)
Anion gap: 12 (ref 5–15)
BUN: 31 mg/dL — ABNORMAL HIGH (ref 6–23)
CHLORIDE: 98 meq/L (ref 96–112)
CO2: 27 mEq/L (ref 19–32)
Calcium: 8.5 mg/dL (ref 8.4–10.5)
Creatinine, Ser: 1.14 mg/dL — ABNORMAL HIGH (ref 0.50–1.10)
GFR calc Af Amer: 58 mL/min — ABNORMAL LOW (ref >90)
GFR calc non Af Amer: 50 mL/min — ABNORMAL LOW (ref >90)
Glucose, Bld: 75 mg/dL (ref 70–99)
Potassium: 5.5 mEq/L — ABNORMAL HIGH (ref 3.7–5.3)
SODIUM: 137 meq/L (ref 137–147)
Total Protein: 7.1 g/dL (ref 6.0–8.3)

## 2013-10-25 LAB — CBC WITH DIFFERENTIAL/PLATELET
BASOS ABS: 0 10*3/uL (ref 0.0–0.1)
Basophils Relative: 1 % (ref 0–1)
Eosinophils Absolute: 0.3 10*3/uL (ref 0.0–0.7)
Eosinophils Relative: 7 % — ABNORMAL HIGH (ref 0–5)
HCT: 29.5 % — ABNORMAL LOW (ref 36.0–46.0)
Hemoglobin: 9.5 g/dL — ABNORMAL LOW (ref 12.0–15.0)
LYMPHS PCT: 16 % (ref 12–46)
Lymphs Abs: 0.7 10*3/uL (ref 0.7–4.0)
MCH: 28.7 pg (ref 26.0–34.0)
MCHC: 32.2 g/dL (ref 30.0–36.0)
MCV: 89.1 fL (ref 78.0–100.0)
Monocytes Absolute: 0.4 10*3/uL (ref 0.1–1.0)
Monocytes Relative: 11 % (ref 3–12)
NEUTROS ABS: 2.8 10*3/uL (ref 1.7–7.7)
NEUTROS PCT: 67 % (ref 43–77)
PLATELETS: 194 10*3/uL (ref 150–400)
RBC: 3.31 MIL/uL — ABNORMAL LOW (ref 3.87–5.11)
RDW: 16.2 % — AB (ref 11.5–15.5)
WBC: 4.2 10*3/uL (ref 4.0–10.5)

## 2013-10-25 NOTE — Patient Instructions (Signed)
..  [facility] Discharge Instructions  RECOMMENDATIONS MADE BY THE CONSULTANT AND ANY TEST RESULTS WILL BE SENT TO YOUR REFERRING PHYSICIAN.  EXAM FINDINGS BY THE PHYSICIAN TODAY AND SIGNS OR SYMPTOMS TO REPORT TO CLINIC OR PRIMARY PHYSICIAN: Exam and findings as discussed by Dr. Barnet Glasgow.  MEDICATIONS PRESCRIBED:  Continue xeloda 14 days on an 7 days off.  INSTRUCTIONS/FOLLOW-UP: 2 months Cbc chem profile and cea  Thank you for choosing [facility] to provide your oncology and hematology care.  To afford each patient quality time with our providers, please arrive at least 15 minutes before your scheduled appointment time.  With your help, our goal is to use those 15 minutes to complete the necessary work-up to ensure our physicians have the information they need to help with your evaluation and healthcare recommendations.    Effective January 1st, 2014, we ask that you re-schedule your appointment with our physicians should you arrive 10 or more minutes late for your appointment.  We strive to give you quality time with our providers, and arriving late affects you and other patients whose appointments are after yours.    Again, thank you for choosing [facility].  Our hope is that these requests will decrease the amount of time that you wait before being seen by our physicians.       _____________________________________________________________  Should you have questions after your visit to [facility], please contact our office at [phone] between the hours of 8:30 a.m. and 4:30 p.m.  Voicemails left after 4:30 p.m. will not be returned until the following business day.  For prescription refill requests, have your pharmacy contact our office with your prescription refill request.    _______________________________________________________________  We hope that we have given you very good care.  You may receive a patient  satisfaction survey in the mail, please complete it and return it as soon as possible.  We value your feedback!  _______________________________________________________________  Have you asked about our STAR program?  STAR stands for Survivorship Training and Rehabilitation, and this is a nationally recognized cancer care program that focuses on survivorship and rehabilitation.  Cancer and cancer treatments may cause problems, such as, pain, making you feel tired and keeping you from doing the things that you need or want to do. Cancer rehabilitation can help. Our goal is to reduce these troubling effects and help you have the best quality of life possible.  You may receive a survey from a nurse that asks questions about your current state of health.  Based on the survey results, all eligible patients will be referred to the [facility] program for an evaluation so we can better serve you!  A frequently asked questions sheet is available upon request.

## 2013-10-25 NOTE — Progress Notes (Signed)
[name] presented for labwork. Labs per MD order drawn via Peripheral Line 25 gauge needle inserted in lt ac  Good blood return present. Procedure without incident.  Needle removed intact. Patient tolerated procedure well.

## 2013-10-25 NOTE — Progress Notes (Signed)
[facility]  OFFICE PROGRESS NOTE  [name], MD [place] [address]  DIAGNOSIS: Rectal cancer - Plan: CBC with Differential, Comprehensive metabolic panel, CEA, Ferritin  Type II or unspecified type diabetes mellitus without mention of complication, uncontrolled  Chief Complaint  Patient presents with  . Follow-up  . Rectal Cancer    CURRENT THERAPY: Status post combined modality therapy with Xeloda plus radiation treatment from 03/25/2013 through his 05/04/2013 followed by abdominal perineal resection performed on 07/26/2013 at which time pT3 N0, grade 2 disease was found. Currently taking Xeloda 1000 mg twice a day with food for 2 weeks on and one-week off with plans to deliver treatment until December of 2015.  INTERVAL HISTORY: [name] 63 y.o. female returns for followup of carcinoma of the rectum, locally advanced, status post combined modality therapy with Xeloda plus radiotherapy followed by abdominal perineal resection on 07/26/2013 and currently taking Xeloda 1000 mg twice a day for 14 days on, 7 days off with plans to continue treatment until December of 2015. She is here today with her caretaker. Over the course of time she has complained of some leg cramps but denies any redness of the soles or palms. She's had no diarrhea. New colostomy appliance lasts for 7 days at a time before needing to be changed. She denies any vomiting but has occasional nausea. She does in the sun a lot. She denies any PND, orthopnea, palpitations, abdominal distention, abdominal pain, back pain, worsening lower extremity swelling or redness, skin rash, headache, or seizures.    MEDICAL HISTORY: Past Medical History  Diagnosis Date  . Hypertension   . Diabetes mellitus     years  . Asthma   . Depression   . Mental retardation   . History of recurrent UTIs   . Shortness of breath     with exertion  . Cancer      Rectal   . Arthritis   . Gout   . Seizures     more than 4 yrs since last seizure. UNknown etiology  . Rectal cancer     INTERIM HISTORY: has DIABETES MELLITUS, TYPE II, UNCONTROLLED; HYPERLIPIDEMIA; GOUT NOS; UNSPECIFIED DEFICIENCY ANEMIA; ANEMIA; DEPRESSION; RETARDATION, MENTAL NOS; HYPERTENSION; SINUS TACHYCARDIA; ALLERGIC RHINITIS; ASTHMA; ASTHMA, WITH ACUTE EXACERBATION; DISEASE, PANCREAS NOS; INTERTRIGO, CANDIDAL; ARTHRITIS; SEIZURE DISORDER; UNSPECIFIED SLEEP DISTURBANCE; FLANK PAIN, LEFT; LIVER FUNCTION TESTS, ABNORMAL; MIGRAINES, HX OF; Elevated liver enzymes; Helicobacter pylori gastritis; and Rectal cancer on her problem list.    ALLERGIES:  has No Known Allergies.  MEDICATIONS: has a current medication list which includes the following prescription(s): acetaminophen, amlodipine, aspirin ec, capecitabine, ferrous sulfate, fish oil-omega-3 fatty acids, fluticasone-salmeterol, insulin glargine, loratadine-pseudoephedrine, metformin, metoprolol succinate, montelukast, oxycodone-acetaminophen, pantoprazole, phenytoin, pravastatin, sodium chloride, triamcinolone cream, diphenoxylate-atropine, and ibuprofen.  SURGICAL HISTORY:  Past Surgical History  Procedure Laterality Date  . Abdominal hysterectomy    . Multiple extractions with alveoloplasty N/A 11/23/2012    Procedure: MULTIPLE EXTRACION 1, 2, 4, 5, 6, 7, 8, 9, 10, 11, 12, 13, 14, 17, 18, 20, 23, 24, 25, 26, 28, 29, 32 WITH ALVEOLOPLASTY, REMOVE BILATERAL TORI;  Surgeon: Gae Bon, DDS;  Location: [place] [facility];  Service: Oral Surgery;  Laterality: N/A;  . Colonoscopy with esophagogastroduodenoscopy (egd) N/A 01/14/2013    Procedure: COLONOSCOPY WITH ESOPHAGOGASTRODUODENOSCOPY (EGD);  Surgeon: Rogene Houston, MD;  [facility];  Service: Endoscopy;  Laterality: N/A;  250-moved  to 315 Ann to notify pt  . Colonoscopy N/A 02/04/2013    Procedure: COLONOSCOPY;  Surgeon: Rogene Houston, MD;  [facility];  Service:  Endoscopy;  Laterality: N/A;  225  . Eus N/A 02/18/2013    Procedure: LOWER ENDOSCOPIC ULTRASOUND (EUS);  Surgeon: Milus Banister, MD;  Location: [facility];  Service: Endoscopy;  Laterality: N/A;  . Flexible sigmoidoscopy N/A 07/26/2013    Procedure: FLEXIBLE SIGMOIDOSCOPY;  Surgeon: Jamesetta So, MD;  Location: [facility];  Service: General;  Laterality: N/A;  . Abdominal perineal bowel resection N/A 07/26/2013    Procedure:  ABDOMINAL PERINEAL RESECTION;  Surgeon: Jamesetta So, MD;  Location: [facility];  Service: General;  Laterality: N/A;  . Supracervical abdominal hysterectomy N/A 07/26/2013    Procedure: HYSTERECTOMY SUPRACERVICAL ABDOMINAL ;  Surgeon: Jamesetta So, MD;  Location: [facility];  Service: General;  Laterality: N/A;  . Salpingoophorectomy Bilateral 07/26/2013    Procedure: SALPINGO OOPHORECTOMY;  Surgeon: Jamesetta So, MD;  Location: [facility];  Service: General;  Laterality: Bilateral;  . Colostomy Left 07/26/2013    Procedure: COLOSTOMY;  Surgeon: Jamesetta So, MD;  Location: [facility];  Service: General;  Laterality: Left;    FAMILY HISTORY: family history is not on file.  SOCIAL HISTORY:  reports that she quit smoking about 5 years ago. Her smoking use included Cigarettes. She smoked 0.00 packs per day. She has never used smokeless tobacco. She reports that she does not drink alcohol or use illicit drugs.  REVIEW OF SYSTEMS:  Other than that discussed above is noncontributory.  PHYSICAL EXAMINATION: ECOG PERFORMANCE STATUS: 1 - Symptomatic but completely ambulatory  Blood pressure 137/69, pulse 76, temperature 97.5 F (36.4 C), temperature source Oral, resp. rate 18, weight 216 lb 8 oz (98.204 kg).  GENERAL:alert, no distress and comfortable. Moderately obese.  SKIN: skin color, texture, turgor are normal, no rashes or significant lesions. Hyperpigmentation of skin creases.  EYES: PERLA; Conjunctiva are pink and non-injected, sclera clear SINUSES: No redness or tenderness  over maxillary or ethmoid sinuses OROPHARYNX:no exudate, no erythema on lips, buccal mucosa, or tongue. NECK: supple, thyroid normal size, non-tender, without nodularity. No masses CHEST: No breast masses.  LYMPH:  no palpable lymphadenopathy in the cervical, axillary or inguinal LUNGS: clear to auscultation and percussion with normal breathing effort HEART: regular rate & rhythm and no murmurs. ABDOMEN:abdomen soft, non-tender and normal bowel sounds. Colostomy bag in place with no evidence of hepatosplenomegaly, ascites, or CVA tenderness. MUSCULOSKELETAL:no cyanosis of digits and no clubbing. Range of motion normal.  NEURO: alert & oriented x 3 with fluent speech, no focal motor/sensory deficits   LABORATORY DATA: Appointment on 10/25/2013  Component Date Value Ref Range Status  . WBC 10/25/2013 4.2  4.0 - 10.5 K/uL Final  . RBC 10/25/2013 3.31* 3.87 - 5.11 MIL/uL Final  . Hemoglobin 10/25/2013 9.5* 12.0 - 15.0 g/dL Final  . HCT 10/25/2013 29.5* 36.0 - 46.0 % Final  . MCV 10/25/2013 89.1  78.0 - 100.0 fL Final  . MCH 10/25/2013 28.7  26.0 - 34.0 pg Final  . MCHC 10/25/2013 32.2  30.0 - 36.0 g/dL Final  . RDW 10/25/2013 16.2* 11.5 - 15.5 % Final  . Platelets 10/25/2013 194  150 - 400 K/uL Final  . Neutrophils Relative % 10/25/2013 67  43 - 77 % Final  . Neutro Abs 10/25/2013 2.8  1.7 - 7.7 K/uL Final  . Lymphocytes Relative 10/25/2013 16  12 - 46 %  Final  . Lymphs Abs 10/25/2013 0.7  0.7 - 4.0 K/uL Final  . Monocytes Relative 10/25/2013 11  3 - 12 % Final  . Monocytes Absolute 10/25/2013 0.4  0.1 - 1.0 K/uL Final  . Eosinophils Relative 10/25/2013 7* 0 - 5 % Final  . Eosinophils Absolute 10/25/2013 0.3  0.0 - 0.7 K/uL Final  . Basophils Relative 10/25/2013 1  0 - 1 % Final  . Basophils Absolute 10/25/2013 0.0  0.0 - 0.1 K/uL Final  . Sodium 10/25/2013 137  137 - 147 mEq/L Final  . Potassium 10/25/2013 5.5* 3.7 - 5.3 mEq/L Final  . Chloride 10/25/2013 98  96 - 112 mEq/L Final  .  CO2 10/25/2013 27  19 - 32 mEq/L Final  . Glucose, Bld 10/25/2013 75  70 - 99 mg/dL Final  . BUN 10/25/2013 31* 6 - 23 mg/dL Final  . Creatinine, Ser 10/25/2013 1.14* 0.50 - 1.10 mg/dL Final  . Calcium 10/25/2013 8.5  8.4 - 10.5 mg/dL Final  . Total Protein 10/25/2013 7.1  6.0 - 8.3 g/dL Final  . Albumin 10/25/2013 3.6  3.5 - 5.2 g/dL Final  . AST 10/25/2013 20  0 - 37 U/L Final  . ALT 10/25/2013 12  0 - 35 U/L Final  . Alkaline Phosphatase 10/25/2013 254* 39 - 117 U/L Final  . Total Bilirubin 10/25/2013 <0.2* 0.3 - 1.2 mg/dL Final  . GFR calc non Af Amer 10/25/2013 50* >90 mL/min Final  . GFR calc Af Amer 10/25/2013 58* >90 mL/min Final   Comment: (NOTE)                          The eGFR has been calculated using the CKD EPI equation.                          This calculation has not been validated in all clinical situations.                          eGFR's persistently <90 mL/min signify possible Chronic Kidney                          Disease.  . Anion gap 10/25/2013 12  5 - 15 Final    PATHOLOGY: FINAL for [name], [name] ([id number])  Patient: [name], [name] Collected: 07/26/2013 Client: [facility]  Accession: [id number] Received: 07/27/2013 [facility]  DOB: 1950/04/20 Age: 53 Gender: F Reported: 07/28/2013 [address]  Patient Ph: [phone] MRN #: [id number] Linna Hoff [place] [id number]  Visit #: [id number] Chart #: Phone: [phone] Fax:  CC:  REPORT OF SURGICAL PATHOLOGY  ADDITIONAL INFORMATION:  2. Mismatch Repair (MMR) Protein Immunohistochemistry (IHC)  IHC Expression Result:  MLH1: Preserved nuclear expression (greater 50% tumor expression)  MSH2: Preserved nuclear expression (greater 50% tumor expression)  MSH6: Preserved nuclear expression (greater 50% tumor expression)  PMS2: Preserved nuclear expression (greater 50% tumor expression)  * Internal control demonstrates intact nuclear expression  Interpretation: NORMAL  There is preserved expression of  the major and minor MMR proteins. There is a very low probability that  microsatellite instability (MSI) is present. However, certain clinically significant MMR protein mutations may  result in preservation of nuclear expression. It is recommended that the preservation of protein expression be  correlated with molecular based MSI testing.  References:  1. Guidelines on Genetic Evaluation and  Management of Lynch Syndrome: A Consensus Statement by the  [place] Multi-Society Task Force on Colorectal Cancer Gae Dry. Sherlie Ban , MD, and others . Am Nicki [place] 2014; [id number]; doi: 10.1038/ajg.2014.186; published online 06 October 2012  2. Outcomes of screening endometrial cancer patients for Lynch syndrome by patient-administered checklist.  Olena Heckle MS, and others. Gynecol Oncol 2013;131(3):619-623.  Aldona Bar MD  Pathologist, Electronic Signature  ( Signed 08/03/2013)  FINAL DIAGNOSIS  Diagnosis  1. Uterus, ovaries and fallopian tubes  - LEIOMYOMATA.  - ENDOMETRIUM: BENIGN ATROPHIC ENDOMETRIUM, NO ATYPIA, HYPERPLASIA OR  MALIGNANCY.  1 of 4  FINAL for [name], [name] [phone])  Diagnosis(continued)  - BILATERAL FALLOPIAN TUBES AND OVARIES: NO PATHOLOGIC ABNORMALITIES.  2. Colon, segmental resection for tumor, rectum  - RESIDUAL INVASIVE ADENOCARCINOMA INVADING INTO AND THROUGH THE MUSCULARIS  PROPRIA INTO PERIRECTAL SOFT TISSUE.  - NO ANGIOLYMPHATIC INVASION IDENTIFIED.  - FIFTEEN LYMPH NODES, NEGATIVE FOR METASTATIC CARCINOMA (0/15).  - RESECTION MARGINS, NEGATIVE FOR ATYPIA OR MALIGNANCY. PLEASE SEE ONCOLOGY  TEMPLATE FOR DETAILS.  Microscopic Comment  2. RECTUM (INCLUDING TRANS-ANAL RESECTION):  Specimen: Rectum including trans-anal resection.  Procedure: Segmental resection  Tumor site: Mid to distal rectum, 4 cm above dentate line  Specimen integrity: Intact.  Macroscopic intactness of mesorectum: Incomplete disrupted.  Macroscopic tumor perforation: No.    Invasive tumor:  Maximum size: 4.5 cm, gross measurement.  Histologic type(s): Invasive adenocarcinoma.  Histologic grade and differentiation: G2  Type of polyp in which invasive carcinoma arose: Not identified.  Microscopic extension of invasive tumor: Invading through the muscularis propria into pericolonic fatty  tissue.  Lymph-Vascular invasion: Not identified.  Peri-neural invasion: Not identified.  Tumor deposit(s) (discontinuous extramural extension): N/A  Resection margins: Negative  Proximal margin: 10 cm  Distal margin: 7 cm  Circumferential (radial) (posterior ascending, posterior descending; lateral  and posterior mid-rectum; and entire lower 1/3 rectum): 1 mm from the inked perirectal soft  tissue margin.  Treatment effect (neo-adjuvant therapy): Present, moderate response 1  Additional polyp(s): N/A  Non-neoplastic findings: Post treatment effect.  Lymph nodes: number examined - 15; number positive: - 0  Pathologic Staging: ypT3, ypN0, pMX  Ancillary studies: MMR stains will be performed and an addendum report will follow.  Aldona Bar MD  Pathologist, Electronic Signature  (Case signed 07/28/2013)  Specimen Gross and Clinical Information  Specimen(s) Obtained:  1. Uterus, ovaries and fallopian tubes  2. Colon, segmental resection for tumor, rectum  Specimen Clinical Information  2. Pre-op: malignant neoplasm of rectum; Post-op: uterine fibroids, malignant neoplasm of rectum  2 of 4  FINAL for [name], [name] ([id number])  Gross  1. Specimen: Supracervical hysterectomy, with attached left tube and ovary and separate right tube and  ovary.  Specimen integrity (intact/incised/disrupted): Intact.  Size and shape: The uterus is distorted, with an overall measurement of 16 x 12 x 8.5 cm.  Weight: 711 grams without adnexa.  Serosa: Tan-pink to hyperemic with scattered granularity, and there are several well-defined subserosal  nodules, which predominantly have  tan-white whorled cut surfaces, the largest also having a diffusely  calcified cut surface.  Cervix: Not included.  Endometrium: The endometrial cavity is distorted, 3.6 x 0.9 cm with tan to hyperemic, smooth, flattened  endometrium.  Myometrium: Tan-pink and distorted by several well-defined intramural nodules, which are up to 9 cm in  diameter and have tan-white whorled cut surfaces. There are no areas of hemorrhage or necrosis.  Right adnexa: The right tube is a 4.6 x 0.5  cm fimbriated segment with a hyperemic serosa with scattered  adhesions and unremarkable cut surfaces. The right ovary is 3.2 x 1.7 x 1 cm, with a tan to hyperemic  wrinkled exterior and unremarkable cut surfaces.  Left adnexa: The attached left tube is a 5.3 cm in length and up to 0.8 cm in diameter fimbriated segment,  which has a tan-pink smooth serosa and unremarkable cut surfaces. The left ovary is 2.8 x 1.9 x 1.2 cm,  with a tan to hyperemic focally wrinkled exterior and unremarkable cut surfaces.  Block Summary:  A, B = full thickness endomyometrium.  C = sections of subserosal and intramural nodules.  D = sections of right tube and ovary.  E = distal end of right tube.  F = sections of left tube and ovary.  G = distal end of left tube.  Total, seven blocks.  2. Specimen: Rectum including anal skin, consistent with abdominal perineal resection. Clinically identified as  rectum.  Specimen integrity: There are a few disrupted areas over the distal half.  Specimen length: 21 cm, which includes 3 cm of anal skin.  Mesorectal intactness: Incomplete, disrupted.  Tumor location: Mid to distal rectum, 4 cm above the dentate line.  Tumor size: 4 cm in length, 4.5 cm in width, tan-red, firm, sessile, essentially ulcerated mass.  Percent of bowel circumference involved: 80%  Tumor distance to margins:  Proximal: 10 cm  Distal: 7 cm  Radial: Beneath the area of ulcerated mucosa, the wall has tan-white dense tissue up  to 1.1 cm thick,  and muscularis is ill-defined. This dense tissue comes within 0.3 cm of the perirectal soft tissue margin.  Macroscopic extent of tumor invasion: The portion of dense tissue below the area of ulcerated mass  involves the entire thickness of the muscularis and areas of surrounding fat.  Total presumed lymph nodes: Fifteen possible lymph nodes ranging from 0.1 to 0.6 cm.  Extramural satellite tumor nodules: None.  Mucosal polyp(s): None.  Additional findings: None.  Block summary:  A = proximal margin.  B, C = shave of distal (anal) margin.  D - G = mass and nearest perirectal soft tissue margin.  H - K = mass.  L = mass and adjacent mucosa  Urinalysis    Component Value Date/Time   COLORURINE YELLOW 07/27/2013 0830   APPEARANCEUR CLOUDY* 07/27/2013 0830   LABSPEC >1.030* 07/27/2013 0830   PHURINE 5.5 07/27/2013 0830   GLUCOSEU NEGATIVE 07/27/2013 0830   HGBUR LARGE* 07/27/2013 0830   BILIRUBINUR NEGATIVE 07/27/2013 0830   KETONESUR NEGATIVE 07/27/2013 0830   PROTEINUR 100* 07/27/2013 0830   UROBILINOGEN 0.2 07/27/2013 0830   NITRITE NEGATIVE 07/27/2013 0830   LEUKOCYTESUR SMALL* 07/27/2013 0830    RADIOGRAPHIC STUDIES: No results found.  ASSESSMENT:  1. Locally advanced adenocarcinoma of the rectum, tolerating combined modality therapy well. Radiation therapy completed on 05/04/2013 which was also the last day she received Xeloda, excellent tolerance, status post APR on 07/26/2013 with marked cytoreduction documented as evidenced by no lymph node involvement. pT3 tumor was found, tolerating Xeloda well. 2. Iron deficiency, on iron supplements,  3. Diabetes mellitus, type II, insulin requiring, controlled.  4. Hypertension, on treatment.  5. Asthma, on treatment.  6. Mental retardation, social worker power of attorney is Melissa.  7. Past history of Helicobacter pylori gastritis, successfully treated.  8. Allergic rhinitis, not symptomatic       PLAN:  #1.Xeloda  1000 mg twice a day with food for 2 weeks  on one week off the next 6 months.  #2. Followup in 2 months with CBC, chem profile, and CEA.       All questions were answered. The patient knows to call the clinic with any problems, questions or concerns. We can certainly see the patient much sooner if necessary.   I spent 25 minutes counseling the patient face to face. The total time spent in the appointment was 30 minutes.    Doroteo Bradford, MD 10/25/2013 3:55 PM  DISCLAIMER:  This note was dictated with voice recognition software.  Similar sounding words can inadvertently be transcribed inaccurately and may not be corrected upon review.

## 2013-10-26 LAB — FERRITIN: FERRITIN: 64 ng/mL (ref 10–291)

## 2013-10-26 LAB — CEA: CEA: 2.4 ng/mL (ref 0.0–5.0)

## 2013-11-04 ENCOUNTER — Other Ambulatory Visit (INDEPENDENT_AMBULATORY_CARE_PROVIDER_SITE_OTHER): Payer: Self-pay | Admitting: Internal Medicine

## 2013-11-04 NOTE — Telephone Encounter (Signed)
Per Dr.Rehman may fill with 1 additional refill. Patient has an appointment in October with Dr.Rehman.

## 2013-12-08 ENCOUNTER — Encounter (INDEPENDENT_AMBULATORY_CARE_PROVIDER_SITE_OTHER): Payer: Self-pay | Admitting: *Deleted

## 2013-12-08 ENCOUNTER — Other Ambulatory Visit (INDEPENDENT_AMBULATORY_CARE_PROVIDER_SITE_OTHER): Payer: Self-pay | Admitting: *Deleted

## 2013-12-08 DIAGNOSIS — C20 Malignant neoplasm of rectum: Secondary | ICD-10-CM

## 2013-12-20 ENCOUNTER — Other Ambulatory Visit (HOSPITAL_COMMUNITY): Payer: Self-pay | Admitting: Oncology

## 2013-12-20 DIAGNOSIS — C20 Malignant neoplasm of rectum: Secondary | ICD-10-CM

## 2013-12-20 MED ORDER — CAPECITABINE 500 MG PO TABS: ORAL_TABLET | ORAL | Status: DC

## 2013-12-27 ENCOUNTER — Encounter (HOSPITAL_COMMUNITY): Payer: Self-pay

## 2013-12-27 ENCOUNTER — Encounter (HOSPITAL_BASED_OUTPATIENT_CLINIC_OR_DEPARTMENT_OTHER): Payer: PRIVATE HEALTH INSURANCE

## 2013-12-27 ENCOUNTER — Encounter (HOSPITAL_COMMUNITY): Payer: PRIVATE HEALTH INSURANCE | Attending: Hematology and Oncology

## 2013-12-27 VITALS — BP 175/71 | HR 90 | Temp 97.8°F | Resp 18 | Wt 212.2 lb

## 2013-12-27 DIAGNOSIS — C20 Malignant neoplasm of rectum: Secondary | ICD-10-CM

## 2013-12-27 DIAGNOSIS — E611 Iron deficiency: Secondary | ICD-10-CM

## 2013-12-27 DIAGNOSIS — F79 Unspecified intellectual disabilities: Secondary | ICD-10-CM

## 2013-12-27 DIAGNOSIS — D649 Anemia, unspecified: Secondary | ICD-10-CM | POA: Diagnosis present

## 2013-12-27 DIAGNOSIS — Z23 Encounter for immunization: Secondary | ICD-10-CM

## 2013-12-27 LAB — CBC WITH DIFFERENTIAL/PLATELET
Basophils Absolute: 0 10*3/uL (ref 0.0–0.1)
Basophils Relative: 0 % (ref 0–1)
Eosinophils Absolute: 0.2 10*3/uL (ref 0.0–0.7)
Eosinophils Relative: 5 % (ref 0–5)
HCT: 31.6 % — ABNORMAL LOW (ref 36.0–46.0)
Hemoglobin: 10.2 g/dL — ABNORMAL LOW (ref 12.0–15.0)
LYMPHS ABS: 0.6 10*3/uL — AB (ref 0.7–4.0)
Lymphocytes Relative: 13 % (ref 12–46)
MCH: 29.6 pg (ref 26.0–34.0)
MCHC: 32.3 g/dL (ref 30.0–36.0)
MCV: 91.6 fL (ref 78.0–100.0)
MONO ABS: 0.3 10*3/uL (ref 0.1–1.0)
MONOS PCT: 7 % (ref 3–12)
NEUTROS PCT: 75 % (ref 43–77)
Neutro Abs: 3.3 10*3/uL (ref 1.7–7.7)
Platelets: 200 10*3/uL (ref 150–400)
RBC: 3.45 MIL/uL — AB (ref 3.87–5.11)
RDW: 15.2 % (ref 11.5–15.5)
WBC: 4.4 10*3/uL (ref 4.0–10.5)

## 2013-12-27 LAB — COMPREHENSIVE METABOLIC PANEL
ALT: 10 U/L (ref 0–35)
AST: 14 U/L (ref 0–37)
Albumin: 3.7 g/dL (ref 3.5–5.2)
Alkaline Phosphatase: 243 U/L — ABNORMAL HIGH (ref 39–117)
Anion gap: 12 (ref 5–15)
BUN: 34 mg/dL — ABNORMAL HIGH (ref 6–23)
CALCIUM: 8.7 mg/dL (ref 8.4–10.5)
CO2: 27 meq/L (ref 19–32)
CREATININE: 1.19 mg/dL — AB (ref 0.50–1.10)
Chloride: 100 mEq/L (ref 96–112)
GFR calc non Af Amer: 48 mL/min — ABNORMAL LOW (ref >90)
GFR, EST AFRICAN AMERICAN: 56 mL/min — AB (ref >90)
GLUCOSE: 156 mg/dL — AB (ref 70–99)
Potassium: 5.4 mEq/L — ABNORMAL HIGH (ref 3.7–5.3)
SODIUM: 139 meq/L (ref 137–147)
Total Protein: 7.5 g/dL (ref 6.0–8.3)

## 2013-12-27 LAB — FERRITIN: Ferritin: 56 ng/mL (ref 10–291)

## 2013-12-27 MED ORDER — INFLUENZA VAC SPLIT QUAD 0.5 ML IM SUSY
0.5000 mL | PREFILLED_SYRINGE | Freq: Once | INTRAMUSCULAR | Status: AC
  Administered 2013-12-27: 0.5 mL via INTRAMUSCULAR
  Filled 2013-12-27: qty 0.5

## 2013-12-27 NOTE — Progress Notes (Signed)
[name] Received the flu vaccine today

## 2013-12-27 NOTE — Patient Instructions (Signed)
[facility] Discharge Instructions  RECOMMENDATIONS MADE BY THE CONSULTANT AND ANY TEST RESULTS WILL BE SENT TO YOUR REFERRING PHYSICIAN.  EXAM FINDINGS BY THE PHYSICIAN TODAY AND SIGNS OR SYMPTOMS TO REPORT TO CLINIC OR PRIMARY PHYSICIAN: You saw Dr Barnet Glasgow today  You received a flu shot today.  Follow up with the doctor in 2 months with lab work.  Please call the clinic if you have any questions or concerns  Thank you for choosing [facility] to provide your oncology and hematology care.  To afford each patient quality time with our providers, please arrive at least 15 minutes before your scheduled appointment time.  With your help, our goal is to use those 15 minutes to complete the necessary work-up to ensure our physicians have the information they need to help with your evaluation and healthcare recommendations.    Effective January 1st, 2014, we ask that you re-schedule your appointment with our physicians should you arrive 10 or more minutes late for your appointment.  We strive to give you quality time with our providers, and arriving late affects you and other patients whose appointments are after yours.    Again, thank you for choosing [facility].  Our hope is that these requests will decrease the amount of time that you wait before being seen by our physicians.       _____________________________________________________________  Should you have questions after your visit to [facility], please contact our office at [phone] between the hours of 8:30 a.m. and 5:00 p.m.  Voicemails left after 4:30 p.m. will not be returned until the following business day.  For prescription refill requests, have your pharmacy contact our office with your prescription refill request.

## 2013-12-27 NOTE — Progress Notes (Signed)
[name]  OFFICE PROGRESS NOTE  [name], MD [place] [address]  DIAGNOSIS: Rectal cancer - Plan: CBC with Differential, Comprehensive metabolic panel, CEA  Rectal carcinoma  Mental retardation  Anemia, unspecified  Anemia - Plan: Ferritin, CBC with Differential, Comprehensive metabolic panel, CEA, Ferritin  Chief Complaint  Patient presents with  . Rectal Cancer    CURRENT THERAPY: Status post combined modality therapy with Xeloda plus radiation treatment from 03/25/2013 through his 05/04/2013 followed by abdominal perineal resection performed on 07/26/2013 at which time pT3 N0, grade 2 disease was found. Currently taking Xeloda 1000 mg twice a day with food for 2 weeks on and one-week off with plans to deliver treatment until December of 2015.      INTERVAL HISTORY: [name] 63 y.o. female returns for followup of carcinoma of the rectum, locally advanced, status post combined modality therapy with Xeloda plus radiotherapy followed by abdominal perineal resection on 07/26/2013 and currently taking Xeloda 1000 mg twice a day for 14 days on, 7 days off with plans to continue treatment until December of 2015. She is off Xeloda this week. She denies any diarrhea, hand-foot syndrome, fever, night sweats, cough, wheezing, sore throat, difficulty in swallowing, worsening lower extremity swelling or redness, skin rash, headache, or seizures.    MEDICAL HISTORY: Past Medical History  Diagnosis Date  . Hypertension   . Diabetes mellitus     years  . Asthma   . Depression   . Mental retardation   . History of recurrent UTIs   . Shortness of breath     with exertion  . Cancer     Rectal   . Arthritis   . Gout   . Seizures     more than 4 yrs since last seizure. UNknown etiology  . Rectal cancer     INTERIM HISTORY: has DIABETES MELLITUS, TYPE II, UNCONTROLLED; HYPERLIPIDEMIA; GOUT  NOS; UNSPECIFIED DEFICIENCY ANEMIA; ANEMIA; DEPRESSION; RETARDATION, MENTAL NOS; HYPERTENSION; SINUS TACHYCARDIA; ALLERGIC RHINITIS; ASTHMA; ASTHMA, WITH ACUTE EXACERBATION; DISEASE, PANCREAS NOS; INTERTRIGO, CANDIDAL; ARTHRITIS; SEIZURE DISORDER; UNSPECIFIED SLEEP DISTURBANCE; FLANK PAIN, LEFT; LIVER FUNCTION TESTS, ABNORMAL; MIGRAINES, HX OF; Elevated liver enzymes; Helicobacter pylori gastritis; and Rectal cancer on her problem list.    ALLERGIES:  has No Known Allergies.  MEDICATIONS: has a current medication list which includes the following prescription(s): acetaminophen, amlodipine, capecitabine, diphenoxylate-atropine, fluticasone-salmeterol, ibuprofen, insulin glargine, loratadine-pseudoephedrine, metformin, metoprolol succinate, montelukast, oxycodone-acetaminophen, pantoprazole, phenytoin, pravastatin, sodium chloride, triamcinolone cream, aspirin ec, ferrous sulfate, and fish oil-omega-3 fatty acids.  SURGICAL HISTORY:  Past Surgical History  Procedure Laterality Date  . Abdominal hysterectomy    . Multiple extractions with alveoloplasty N/A 11/23/2012    Procedure: MULTIPLE EXTRACION 1, 2, 4, 5, 6, 7, 8, 9, 10, 11, 12, 13, 14, 17, 18, 20, 23, 24, 25, 26, 28, 29, 32 WITH ALVEOLOPLASTY, REMOVE BILATERAL TORI;  Surgeon: Gae Bon, DDS;  Location: [facility];  Service: Oral Surgery;  Laterality: N/A;  . Colonoscopy with esophagogastroduodenoscopy (egd) N/A 01/14/2013    Procedure: COLONOSCOPY WITH ESOPHAGOGASTRODUODENOSCOPY (EGD);  Surgeon: Rogene Houston, MD;  Location: [facility];  Service: Endoscopy;  Laterality: N/A;  250-moved to 315 Ann to notify pt  . Colonoscopy N/A 02/04/2013    Procedure: COLONOSCOPY;  Surgeon: Rogene Houston, MD;  Location: [facility];  Service: Endoscopy;  Laterality: N/A;  225  . Eus N/A 02/18/2013  Procedure: LOWER ENDOSCOPIC ULTRASOUND (EUS);  Surgeon: Milus Banister, MD;  Location: [facility];  Service: Endoscopy;  Laterality: N/A;  . Flexible  sigmoidoscopy N/A 07/26/2013    Procedure: FLEXIBLE SIGMOIDOSCOPY;  Surgeon: Jamesetta So, MD;  Location: [facility];  Service: General;  Laterality: N/A;  . Abdominal perineal bowel resection N/A 07/26/2013    Procedure:  ABDOMINAL PERINEAL RESECTION;  Surgeon: Jamesetta So, MD;  Location: [facility];  Service: General;  Laterality: N/A;  . Supracervical abdominal hysterectomy N/A 07/26/2013    Procedure: HYSTERECTOMY SUPRACERVICAL ABDOMINAL ;  Surgeon: Jamesetta So, MD;  Location: [facility];  Service: General;  Laterality: N/A;  . Salpingoophorectomy Bilateral 07/26/2013    Procedure: SALPINGO OOPHORECTOMY;  Surgeon: Jamesetta So, MD;  Location: [facility];  Service: General;  Laterality: Bilateral;  . Colostomy Left 07/26/2013    Procedure: COLOSTOMY;  Surgeon: Jamesetta So, MD;  Location: [facility];  Service: General;  Laterality: Left;    FAMILY HISTORY: family history is not on file.  SOCIAL HISTORY:  reports that she quit smoking about 5 years ago. Her smoking use included Cigarettes. She smoked 0.00 packs per day. She has never used smokeless tobacco. She reports that she does not drink alcohol or use illicit drugs.  REVIEW OF SYSTEMS:  Other than that discussed above is noncontributory.  PHYSICAL EXAMINATION: ECOG PERFORMANCE STATUS: 1 - Symptomatic but completely ambulatory  Blood pressure 175/71, pulse 90, temperature 97.8 F (36.6 C), temperature source Oral, resp. rate 18, weight 212 lb 3.2 oz (96.253 kg), SpO2 98.00%.  GENERAL:alert, no distress and comfortable SKIN: skin color, texture, turgor are normal, no rashes or significant lesions EYES: PERLA; Conjunctiva are pink and non-injected, sclera clear SINUSES: No redness or tenderness over maxillary or ethmoid sinuses OROPHARYNX:no exudate, no erythema on lips, buccal mucosa, or tongue. NECK: supple, thyroid normal size, non-tender, without nodularity. No masses CHEST: Normal AP diameter with no breast masses. LYMPH:  no palpable  lymphadenopathy in the cervical, axillary or inguinal LUNGS: clear to auscultation and percussion with normal breathing effort HEART: regular rate & rhythm and no murmurs. ABDOMEN:abdomen soft, non-tender and normal bowel sounds. Colostomy in place with no organomegaly, ascites, or CVA tenderness. MUSCULOSKELETAL:no cyanosis of digits and no clubbing. Range of motion normal.  NEURO: alert & oriented x 3 with fluent speech, no focal motor/sensory deficits   LABORATORY DATA: Appointment on 12/27/2013  Component Date Value Ref Range Status  . WBC 12/27/2013 4.4  4.0 - 10.5 K/uL Final  . RBC 12/27/2013 3.45* 3.87 - 5.11 MIL/uL Final  . Hemoglobin 12/27/2013 10.2* 12.0 - 15.0 g/dL Final  . HCT 12/27/2013 31.6* 36.0 - 46.0 % Final  . MCV 12/27/2013 91.6  78.0 - 100.0 fL Final  . MCH 12/27/2013 29.6  26.0 - 34.0 pg Final  . MCHC 12/27/2013 32.3  30.0 - 36.0 g/dL Final  . RDW 12/27/2013 15.2  11.5 - 15.5 % Final  . Platelets 12/27/2013 200  150 - 400 K/uL Final  . Neutrophils Relative % 12/27/2013 75  43 - 77 % Final  . Neutro Abs 12/27/2013 3.3  1.7 - 7.7 K/uL Final  . Lymphocytes Relative 12/27/2013 13  12 - 46 % Final  . Lymphs Abs 12/27/2013 0.6* 0.7 - 4.0 K/uL Final  . Monocytes Relative 12/27/2013 7  3 - 12 % Final  . Monocytes Absolute 12/27/2013 0.3  0.1 - 1.0 K/uL Final  . Eosinophils Relative 12/27/2013 5  0 - 5 % Final  .  Eosinophils Absolute 12/27/2013 0.2  0.0 - 0.7 K/uL Final  . Basophils Relative 12/27/2013 0  0 - 1 % Final  . Basophils Absolute 12/27/2013 0.0  0.0 - 0.1 K/uL Final  . Sodium 12/27/2013 139  137 - 147 mEq/L Final  . Potassium 12/27/2013 5.4* 3.7 - 5.3 mEq/L Final  . Chloride 12/27/2013 100  96 - 112 mEq/L Final  . CO2 12/27/2013 27  19 - 32 mEq/L Final  . Glucose, Bld 12/27/2013 156* 70 - 99 mg/dL Final  . BUN 12/27/2013 34* 6 - 23 mg/dL Final  . Creatinine, Ser 12/27/2013 1.19* 0.50 - 1.10 mg/dL Final  . Calcium 12/27/2013 8.7  8.4 - 10.5 mg/dL Final  .  Total Protein 12/27/2013 7.5  6.0 - 8.3 g/dL Final  . Albumin 12/27/2013 3.7  3.5 - 5.2 g/dL Final  . AST 12/27/2013 14  0 - 37 U/L Final  . ALT 12/27/2013 10  0 - 35 U/L Final  . Alkaline Phosphatase 12/27/2013 243* 39 - 117 U/L Final  . Total Bilirubin 12/27/2013 <0.2* 0.3 - 1.2 mg/dL Final  . GFR calc non Af Amer 12/27/2013 48* >90 mL/min Final  . GFR calc Af Amer 12/27/2013 56* >90 mL/min Final   Comment: (NOTE)                          The eGFR has been calculated using the CKD EPI equation.                          This calculation has not been validated in all clinical situations.                          eGFR's persistently <90 mL/min signify possible Chronic Kidney                          Disease.  . Anion gap 12/27/2013 12  5 - 15 Final    PATHOLOGY: No new pathology.  Urinalysis    Component Value Date/Time   COLORURINE YELLOW 07/27/2013 0830   APPEARANCEUR CLOUDY* 07/27/2013 0830   LABSPEC >1.030* 07/27/2013 0830   PHURINE 5.5 07/27/2013 0830   GLUCOSEU NEGATIVE 07/27/2013 0830   HGBUR LARGE* 07/27/2013 0830   BILIRUBINUR NEGATIVE 07/27/2013 0830   KETONESUR NEGATIVE 07/27/2013 0830   PROTEINUR 100* 07/27/2013 0830   UROBILINOGEN 0.2 07/27/2013 0830   NITRITE NEGATIVE 07/27/2013 0830   LEUKOCYTESUR SMALL* 07/27/2013 0830    RADIOGRAPHIC STUDIES: No results found.  ASSESSMENT:  1. Locally advanced adenocarcinoma of the rectum, tolerating combined modality therapy well. Radiation therapy completed on 05/04/2013 which was also the last day she received Xeloda, excellent tolerance, status post APR on 07/26/2013 with marked cytoreduction documented as evidenced by no lymph node involvement. pT3 tumor was found, tolerating Xeloda well.  2. Iron deficiency, on iron supplements,  3. Diabetes mellitus, type II, insulin requiring, controlled.  4. Hypertension, on treatment.  5. Asthma, on treatment.  6. Mental retardation, social worker power of attorney is Melissa.  7. Past history  of Helicobacter pylori gastritis, successfully treated.  8. Allergic rhinitis, not symptomatic     PLAN:  #1.Xeloda 1000 mg twice a day with food for 2 weeks on one week off until December 2015.  #2. Followup in 2 months with CBC, chem profile, and CEA after with Xeloda will be stopped. #  3. Influenza virus vaccine was given today.    All questions were answered. The patient knows to call the clinic with any problems, questions or concerns. We can certainly see the patient much sooner if necessary.   I spent 25 minutes counseling the patient face to face. The total time spent in the appointment was 30 minutes.    Doroteo Bradford, MD 12/27/2013 11:44 AM  DISCLAIMER:  This note was dictated with voice recognition software.  Similar sounding words can inadvertently be transcribed inaccurately and may not be corrected upon review.

## 2013-12-27 NOTE — Progress Notes (Signed)
Labs for cea,ferr,cbcd,cmp

## 2013-12-28 LAB — CEA: CEA: 2.8 ng/mL (ref 0.0–5.0)

## 2013-12-31 ENCOUNTER — Other Ambulatory Visit: Payer: Self-pay

## 2014-01-06 ENCOUNTER — Ambulatory Visit (INDEPENDENT_AMBULATORY_CARE_PROVIDER_SITE_OTHER): Payer: PRIVATE HEALTH INSURANCE | Admitting: Internal Medicine

## 2014-01-11 ENCOUNTER — Ambulatory Visit (INDEPENDENT_AMBULATORY_CARE_PROVIDER_SITE_OTHER): Payer: PRIVATE HEALTH INSURANCE | Admitting: Internal Medicine

## 2014-01-11 ENCOUNTER — Telehealth (HOSPITAL_COMMUNITY): Payer: Self-pay | Admitting: Oncology

## 2014-01-11 ENCOUNTER — Encounter (INDEPENDENT_AMBULATORY_CARE_PROVIDER_SITE_OTHER): Payer: Self-pay | Admitting: Internal Medicine

## 2014-01-11 VITALS — BP 132/68 | HR 78 | Temp 97.9°F | Resp 18 | Ht 64.0 in | Wt 214.8 lb

## 2014-01-11 DIAGNOSIS — D649 Anemia, unspecified: Secondary | ICD-10-CM

## 2014-01-11 DIAGNOSIS — C20 Malignant neoplasm of rectum: Secondary | ICD-10-CM

## 2014-01-11 DIAGNOSIS — R748 Abnormal levels of other serum enzymes: Secondary | ICD-10-CM

## 2014-01-11 NOTE — Progress Notes (Signed)
Presenting complaint;  History of rectal cancer.  Database;  Patient is 63 year old African female with mental retardation who was found to have rectal carcinoma in October 2014 when she presented with hematochezia and iron deficiency anemia. Despite 2 attempts colonoscopy never could be completed because of poor prep. Abdominopelvic CT and PET scan were negative for metastatic disease. Rectal ultrasound revealed T3 N1 disease. She underwent preop chemo and radiation therapy followed by abdominoperineal resection in May 20/15 Dr. Aviva Signs. Patient remains on Xeloda which she will continue through December 2015. She is on to lower for 2 weeks followed by one week off. She had CEA level of around 20 at the time of diagnosis.   Subjective:  Patient presents for scheduled visit. She is accompanied by Ms. Anell Barr employee of rest home. Patient has no complaints. She denies abdominal pain nausea vomiting bleeding or melena into colostomy bag. She has not taken the Motrin or Percocet recently. She took Percocet when she had her she pulled. Her weight has been stable since her last visit of June 2015. She does not take ibuprofen very often. Heartburn is well controlled with pantoprazole and she denies dysphagia.   Current Medications: Outpatient Encounter Prescriptions as of 01/11/2014  Medication Sig  . acetaminophen (TYLENOL) 650 MG CR tablet Take 650 mg by mouth every 8 (eight) hours as needed for pain.  [name] amLODipine (NORVASC) 2.5 MG tablet Take 2.5 mg by mouth daily.  [name] aspirin EC 81 MG tablet Take 81 mg by mouth daily.  [name] BOOSTRIX 5-2.5-18.5 injection Inject 0.5 mLs into the muscle once.   . capecitabine (XELODA) 500 MG tablet Take 2 tablets twice a day with food for 14 days on, rest 7 days, and repeat for a total of 6 months.  . diphenoxylate-atropine (LOMOTIL) 2.5-0.025 MG per tablet Take 1 tablet by mouth every 2 (two) hours as needed for diarrhea or loose stools.  . docusate  sodium (COLACE) 100 MG capsule Take 100 mg by mouth 2 (two) times daily.  . ferrous sulfate 325 (65 FE) MG tablet TAKE (1) TABLET BY MOUTH TWICE DAILY.  . fish oil-omega-3 fatty acids 1000 MG capsule Take 1 g by mouth 2 (two) times daily.  . Fluticasone-Salmeterol (ADVAIR) 250-50 MCG/DOSE AEPB Inhale 1 puff into the lungs every 12 (twelve) hours.  [name] ibuprofen (ADVIL,MOTRIN) 600 MG tablet Take 1 tablet by mouth as needed.  . Insulin Glargine (LANTUS SOLOSTAR) 100 UNIT/ML SOPN Inject 15 Units into the skin at bedtime.   [name] loratadine-pseudoephedrine (LORATADINE-D 24HR) 10-240 MG per 24 hr tablet Take 1 tablet by mouth daily.  . metFORMIN (GLUCOPHAGE) 1000 MG tablet Take 1,000 mg by mouth 2 (two) times daily with a meal.  . metoprolol succinate (TOPROL-XL) 50 MG 24 hr tablet Take 100 mg by mouth at bedtime. Take with or immediately following a meal.  . montelukast (SINGULAIR) 10 MG tablet Take 10 mg by mouth at bedtime.  [name] oxyCODONE-acetaminophen (PERCOCET/ROXICET) 5-325 MG per tablet Take 1-2 tablets by mouth every 4 (four) hours as needed for moderate pain.  . pantoprazole (PROTONIX) 40 MG tablet Take 40 mg by mouth daily.  . phenytoin (DILANTIN) 100 MG ER capsule Take 100 mg by mouth 2 (two) times daily.  . pravastatin (PRAVACHOL) 40 MG tablet Take 40 mg by mouth daily.  . sodium chloride (OCEAN) 0.65 % SOLN nasal spray Place 2 sprays into both nostrils 4 (four) times daily as needed for congestion.  . triamcinolone cream (KENALOG) 0.1 % Apply  1 application topically daily. To legs  . Zinc Oxide (BOUDREAUXS BUTT PASTE EX) Apply topically 2 (two) times daily.     Objective: Blood pressure 132/68, pulse 78, temperature 97.9 F (36.6 C), temperature source Oral, resp. rate 18, height 5\' 4"  (1.626 m), weight 214 lb 12.8 oz (97.433 kg). Patient is alert and in no acute distress. Conjunctiva is pink. Sclera is nonicteric Oropharyngeal mucosa is normal. No neck masses or thyromegaly noted. Cardiac  exam with regular rhythm normal S1 and S2. No murmur or gallop noted. Lungs are clear to auscultation. Abdomen. Colostomy is located at [facility]. Colostomy bag has thick brownish stool. This lower midline scar. Abdomen is soft and nontender without organomegaly or masses  No LE edema or clubbing noted.  Labs/studies Results: Lab data from 12/27/2013  WBC 4.4, H&H 10.2 and 31.6 and platelet count 200K.   bilirubin less than 0.2, AP 243, AST 14, ALT 10, albumin 3.7 Glucose 156, BUN 34 and creatinine 1.19. CEA 2.8.  CEA was 21.0 on 02/08/2013.  Assessment:  #1. History of locally advanced rectal adenocarcinoma treated with preop chemoradiation followed by APR. APR was in May of this year. Patient remains on to load up and she will continue to and of December. She remains in remission. She has not had followup imaging but will leave it up to Dr. Barnet Glasgow. Patient's 2 prior colonoscopies but incomplete secondary to poor prep. #2. Anemia possibly of chronic disease. H&H is coming up. #3. Mildly elevated alkaline phosphatase with stable or improving trend most likely secondary to medications and/or diabetes.   Plan:  Colonoscopy in January 2016 once she has completed Xeloda.

## 2014-01-11 NOTE — Telephone Encounter (Signed)
Dr. Laural Golden discussed the patient's case with me.  He plans on performing a colonoscopy on the patient in Jan 2016.  He was wondering about the role of CT Abd/Pelvis prior to colonoscopy.  I discussed the case with Dr. Barnet Glasgow and he has deferred that decision until the patient's Dec 2015 appointment at which time he will decide on the utility of this imaging procedure.   KEFALAS,THOMAS 01/11/2014

## 2014-01-11 NOTE — Patient Instructions (Signed)
Colonoscopy to be scheduled in January 2016

## 2014-01-11 NOTE — Telephone Encounter (Signed)
Noted and agree with plans

## 2014-01-24 ENCOUNTER — Other Ambulatory Visit (HOSPITAL_COMMUNITY): Payer: Self-pay | Admitting: Oncology

## 2014-01-24 DIAGNOSIS — C20 Malignant neoplasm of rectum: Secondary | ICD-10-CM

## 2014-01-24 MED ORDER — DIPHENOXYLATE-ATROPINE 2.5-0.025 MG PO TABS: 1.0000 | ORAL_TABLET | ORAL | Status: DC | PRN

## 2014-02-01 ENCOUNTER — Other Ambulatory Visit (INDEPENDENT_AMBULATORY_CARE_PROVIDER_SITE_OTHER): Payer: Self-pay | Admitting: Internal Medicine

## 2014-02-21 ENCOUNTER — Encounter (INDEPENDENT_AMBULATORY_CARE_PROVIDER_SITE_OTHER): Payer: Self-pay | Admitting: *Deleted

## 2014-02-28 ENCOUNTER — Encounter (HOSPITAL_COMMUNITY): Payer: Self-pay

## 2014-02-28 ENCOUNTER — Encounter (HOSPITAL_BASED_OUTPATIENT_CLINIC_OR_DEPARTMENT_OTHER): Payer: PRIVATE HEALTH INSURANCE

## 2014-02-28 ENCOUNTER — Encounter (HOSPITAL_COMMUNITY): Payer: PRIVATE HEALTH INSURANCE | Attending: Hematology and Oncology

## 2014-02-28 VITALS — BP 153/69 | HR 81 | Temp 98.6°F | Resp 14 | Wt 217.0 lb

## 2014-02-28 DIAGNOSIS — D649 Anemia, unspecified: Secondary | ICD-10-CM | POA: Diagnosis present

## 2014-02-28 DIAGNOSIS — C20 Malignant neoplasm of rectum: Secondary | ICD-10-CM | POA: Diagnosis not present

## 2014-02-28 DIAGNOSIS — F79 Unspecified intellectual disabilities: Secondary | ICD-10-CM

## 2014-02-28 DIAGNOSIS — G40909 Epilepsy, unspecified, not intractable, without status epilepticus: Secondary | ICD-10-CM

## 2014-02-28 DIAGNOSIS — E611 Iron deficiency: Secondary | ICD-10-CM

## 2014-02-28 LAB — COMPREHENSIVE METABOLIC PANEL
ALBUMIN: 3.5 g/dL (ref 3.5–5.2)
ALT: 11 U/L (ref 0–35)
AST: 16 U/L (ref 0–37)
Alkaline Phosphatase: 230 U/L — ABNORMAL HIGH (ref 39–117)
Anion gap: 11 (ref 5–15)
BUN: 32 mg/dL — ABNORMAL HIGH (ref 6–23)
CALCIUM: 8.4 mg/dL (ref 8.4–10.5)
CO2: 27 mEq/L (ref 19–32)
Chloride: 98 mEq/L (ref 96–112)
Creatinine, Ser: 1.2 mg/dL — ABNORMAL HIGH (ref 0.50–1.10)
GFR calc Af Amer: 55 mL/min — ABNORMAL LOW (ref >90)
GFR calc non Af Amer: 47 mL/min — ABNORMAL LOW (ref >90)
Glucose, Bld: 84 mg/dL (ref 70–99)
Potassium: 5.6 mEq/L — ABNORMAL HIGH (ref 3.7–5.3)
Sodium: 136 mEq/L — ABNORMAL LOW (ref 137–147)
Total Bilirubin: <0.2 mg/dL — ABNORMAL LOW (ref 0.3–1.2)
Total Protein: 7.1 g/dL (ref 6.0–8.3)

## 2014-02-28 LAB — CBC WITH DIFFERENTIAL/PLATELET
BASOS PCT: 0 % (ref 0–1)
Basophils Absolute: 0 10*3/uL (ref 0.0–0.1)
EOS ABS: 0.2 10*3/uL (ref 0.0–0.7)
Eosinophils Relative: 6 % — ABNORMAL HIGH (ref 0–5)
HCT: 27.9 % — ABNORMAL LOW (ref 36.0–46.0)
Hemoglobin: 9.1 g/dL — ABNORMAL LOW (ref 12.0–15.0)
Lymphocytes Relative: 16 % (ref 12–46)
Lymphs Abs: 0.7 10*3/uL (ref 0.7–4.0)
MCH: 30.5 pg (ref 26.0–34.0)
MCHC: 32.6 g/dL (ref 30.0–36.0)
MCV: 93.6 fL (ref 78.0–100.0)
MONO ABS: 0.4 10*3/uL (ref 0.1–1.0)
MONOS PCT: 11 % (ref 3–12)
NEUTROS PCT: 67 % (ref 43–77)
Neutro Abs: 2.7 10*3/uL (ref 1.7–7.7)
Platelets: 177 10*3/uL (ref 150–400)
RBC: 2.98 MIL/uL — ABNORMAL LOW (ref 3.87–5.11)
RDW: 14.3 % (ref 11.5–15.5)
WBC: 4.1 10*3/uL (ref 4.0–10.5)

## 2014-02-28 LAB — FERRITIN: Ferritin: 66 ng/mL (ref 10–291)

## 2014-02-28 NOTE — Progress Notes (Signed)
[facility]  OFFICE PROGRESS NOTE  [name], MD [place] [id number]  DIAGNOSIS: Rectal carcinoma - Plan: CT Abdomen Pelvis W Contrast, CT Chest W Contrast  Mental retardation  Seizure disorder  Anemia - Plan: CBC with Differential, Comprehensive metabolic panel, CEA, Ferritin  Chief Complaint  Patient presents with  . Rectal Cancer    CURRENT THERAPY: Xeloda 1000 mg twice a day, status post combined modality therapy with Xeloda plus radiation treatment from 03/25/2013 through his 05/04/2013 followed by abdominal perineal resection performed on 07/26/2013 at which time pT3 N0, grade 2 disease was found. Currently taking Xeloda 1000 mg twice a day with food for 2 weeks on and one-week off with plans to deliver treatment until December of 2015.   INTERVAL HISTORY: [name] 63 y.o. female returns forfollowup of carcinoma of the rectum, locally advanced, status post combined modality therapy with Xeloda plus radiotherapy followed by abdominal perineal resection on 07/26/2013 and currently taking Xeloda 1000 mg twice a day for 14 days on, 7 days off with plans to continue treatment until December of 2015 . As always she has a smile on her face. She denies any abdominal pain, nausea, vomiting, diarrhea. Colostomy, worsening lower extremity swelling or redness, cough, wheezing, hand-foot syndrome, skin rash, headache, or seizures.  MEDICAL HISTORY: Past Medical History  Diagnosis Date  . Hypertension   . Diabetes mellitus     years  . Asthma   . Depression   . Mental retardation   . History of recurrent UTIs   . Shortness of breath     with exertion  . Cancer     Rectal   . Arthritis   . Gout   . Seizures     more than 4 yrs since last seizure. UNknown etiology  . Rectal cancer     INTERIM HISTORY: has DIABETES MELLITUS, TYPE II, UNCONTROLLED; HYPERLIPIDEMIA; GOUT NOS; UNSPECIFIED  DEFICIENCY ANEMIA; ANEMIA; DEPRESSION; RETARDATION, MENTAL NOS; HYPERTENSION; SINUS TACHYCARDIA; ALLERGIC RHINITIS; ASTHMA; ASTHMA, WITH ACUTE EXACERBATION; DISEASE, PANCREAS NOS; INTERTRIGO, CANDIDAL; ARTHRITIS; SEIZURE DISORDER; UNSPECIFIED SLEEP DISTURBANCE; FLANK PAIN, LEFT; LIVER FUNCTION TESTS, ABNORMAL; MIGRAINES, HX OF; Elevated liver enzymes; Helicobacter pylori gastritis; and Rectal cancer on her problem list.    ALLERGIES:  has No Known Allergies.  MEDICATIONS: has a current medication list which includes the following prescription(s): amlodipine, aspirin ec, docusate sodium, ferrous sulfate, fish oil-omega-3 fatty acids, fluticasone-salmeterol, ibuprofen, insulin glargine, loperamide, loratadine-pseudoephedrine, metformin, metoprolol succinate, montelukast, pantoprazole, phenytoin, pravastatin, sodium chloride, triamcinolone cream, zinc oxide, acetaminophen, boostrix, capecitabine, and diphenoxylate-atropine.  SURGICAL HISTORY:  Past Surgical History  Procedure Laterality Date  . Abdominal hysterectomy    . Multiple extractions with alveoloplasty N/A 11/23/2012    Procedure: MULTIPLE EXTRACION 1, 2, 4, 5, 6, 7, 8, 9, 10, 11, 12, 13, 14, 17, 18, 20, 23, 24, 25, 26, 28, 29, 32 WITH ALVEOLOPLASTY, REMOVE BILATERAL TORI;  Surgeon: Gae Bon, DDS;  Location: [facility];  Service: Oral Surgery;  Laterality: N/A;  . Colonoscopy with esophagogastroduodenoscopy (egd) N/A 01/14/2013    Procedure: COLONOSCOPY WITH ESOPHAGOGASTRODUODENOSCOPY (EGD);  Surgeon: Rogene Houston, MD;  [facility];  Service: Endoscopy;  Laterality: N/A;  250-moved to 315 Ann to notify pt  . Colonoscopy N/A 02/04/2013    Procedure: COLONOSCOPY;  Surgeon: Rogene Houston, MD;  [facility];  Service: Endoscopy;  Laterality: N/A;  225  . Eus N/A  02/18/2013    Procedure: LOWER ENDOSCOPIC ULTRASOUND (EUS);  Surgeon: Milus Banister, MD;  Location: [facility];  Service: Endoscopy;  Laterality: N/A;  .  Flexible sigmoidoscopy N/A 07/26/2013    Procedure: FLEXIBLE SIGMOIDOSCOPY;  Surgeon: Jamesetta So, MD;  Location: [facility];  Service: General;  Laterality: N/A;  . Abdominal perineal bowel resection N/A 07/26/2013    Procedure:  ABDOMINAL PERINEAL RESECTION;  Surgeon: Jamesetta So, MD;  Location: [facility];  Service: General;  Laterality: N/A;  . Supracervical abdominal hysterectomy N/A 07/26/2013    Procedure: HYSTERECTOMY SUPRACERVICAL ABDOMINAL ;  Surgeon: Jamesetta So, MD;  Location: [facility];  Service: General;  Laterality: N/A;  . Salpingoophorectomy Bilateral 07/26/2013    Procedure: SALPINGO OOPHORECTOMY;  Surgeon: Jamesetta So, MD;  Location: [facility];  Service: General;  Laterality: Bilateral;  . Colostomy Left 07/26/2013    Procedure: COLOSTOMY;  Surgeon: Jamesetta So, MD;  Location: [facility];  Service: General;  Laterality: Left;    FAMILY HISTORY: family history is not on file.  SOCIAL HISTORY:  reports that she quit smoking about 5 years ago. Her smoking use included Cigarettes. She smoked 0.00 packs per day. She has never used smokeless tobacco. She reports that she does not drink alcohol or use illicit drugs.  REVIEW OF SYSTEMS:  Other than that discussed above is noncontributory.  PHYSICAL EXAMINATION: ECOG PERFORMANCE STATUS: 1 - Symptomatic but completely ambulatory  Blood pressure 153/69, pulse 81, temperature 98.6 F (37 C), temperature source Oral, resp. rate 14, weight 217 lb (98.431 kg), SpO2 100 %.  GENERAL:alert, no distress and comfortable. Moderately obese. SKIN: skin color, texture, turgor are normal, no rashes or significant lesions EYES: PERLA; Conjunctiva are pink and non-injected, sclera clear SINUSES: No redness or tenderness over maxillary or ethmoid sinuses OROPHARYNX:no exudate, no erythema on lips, buccal mucosa, or tongue. Edentulous. NECK: supple, thyroid normal size, non-tender, without nodularity. No masses CHEST: Normal AP diameter with no breast  masses. LYMPH:  no palpable lymphadenopathy in the cervical, axillary or inguinal LUNGS: clear to auscultation and percussion with normal breathing effort HEART: regular rate & rhythm and no murmurs. ABDOMEN:abdomen soft, non-tender and normal bowel sounds. Colostomy in place with no evidence of stomal reliance or bleeding. MUSCULOSKELETAL:no cyanosis of digits and no clubbing. Range of motion normal.  NEURO: alert & oriented x 3 with fluent speech, no focal motor/sensory deficits   LABORATORY DATA: Office Visit on 02/28/2014  Component Date Value Ref Range Status  . WBC 02/28/2014 4.1  4.0 - 10.5 K/uL Final  . RBC 02/28/2014 2.98* 3.87 - 5.11 MIL/uL Final  . Hemoglobin 02/28/2014 9.1* 12.0 - 15.0 g/dL Final  . HCT 02/28/2014 27.9* 36.0 - 46.0 % Final  . MCV 02/28/2014 93.6  78.0 - 100.0 fL Final  . MCH 02/28/2014 30.5  26.0 - 34.0 pg Final  . MCHC 02/28/2014 32.6  30.0 - 36.0 g/dL Final  . RDW 02/28/2014 14.3  11.5 - 15.5 % Final  . Platelets 02/28/2014 177  150 - 400 K/uL Final  . Neutrophils Relative % 02/28/2014 67  43 - 77 % Final  . Neutro Abs 02/28/2014 2.7  1.7 - 7.7 K/uL Final  . Lymphocytes Relative 02/28/2014 16  12 - 46 % Final  . Lymphs Abs 02/28/2014 0.7  0.7 - 4.0 K/uL Final  . Monocytes Relative 02/28/2014 11  3 - 12 % Final  . Monocytes Absolute 02/28/2014 0.4  0.1 - 1.0 K/uL Final  . Eosinophils  Relative 02/28/2014 6* 0 - 5 % Final  . Eosinophils Absolute 02/28/2014 0.2  0.0 - 0.7 K/uL Final  . Basophils Relative 02/28/2014 0  0 - 1 % Final  . Basophils Absolute 02/28/2014 0.0  0.0 - 0.1 K/uL Final    PATHOLOGY: No new pathology.  Urinalysis    Component Value Date/Time   COLORURINE YELLOW 07/27/2013 0830   APPEARANCEUR CLOUDY* 07/27/2013 0830   LABSPEC >1.030* 07/27/2013 0830   PHURINE 5.5 07/27/2013 0830   GLUCOSEU NEGATIVE 07/27/2013 0830   HGBUR LARGE* 07/27/2013 0830   BILIRUBINUR NEGATIVE 07/27/2013 0830   KETONESUR NEGATIVE 07/27/2013 0830    PROTEINUR 100* 07/27/2013 0830   UROBILINOGEN 0.2 07/27/2013 0830   NITRITE NEGATIVE 07/27/2013 0830   LEUKOCYTESUR SMALL* 07/27/2013 0830    RADIOGRAPHIC STUDIES: No results found.  ASSESSMENT:  1. Locally advanced adenocarcinoma of the rectum, tolerating combined modality therapy well. Radiation therapy completed on 05/04/2013 which was also the last day she received Xeloda, excellent tolerance, status post APR on 07/26/2013 with marked cytoreduction documented as evidenced by no lymph node involvement. pT3 tumor was found, tolerating Xeloda well. To complete therapy with a supply of that she has at her nursing home facility. 2. Iron deficiency, on iron supplements,  3. Diabetes mellitus, type II, insulin requiring, controlled.  4. Hypertension, on treatment.  5. Asthma, on treatment.  6. Mental retardation, social worker power of attorney is Melissa.  7. Past history of Helicobacter pylori gastritis, successfully treated.  8. Allergic rhinitis, not symptomatic     PLAN:  #1. CT chest abdomen and pelvis with contrast to restage in one week. #2. Follow-up in 6 months with CBC, chem profile, CEA, ferritin. #3. The nurse accompanying her was informed that she should exhaust whatever supply of Xeloda is currently at the nursing home and then to not order any more. She expressed an understanding of this strategy.   All questions were answered. The patient knows to call the clinic with any problems, questions or concerns. We can certainly see the patient much sooner if necessary.   I spent 25 minutes counseling the patient face to face. The total time spent in the appointment was 30 minutes.    Doroteo Bradford, MD 02/28/2014 10:41 AM  DISCLAIMER:  This note was dictated with voice recognition software.  Similar sounding words can inadvertently be transcribed inaccurately and may not be corrected upon review.

## 2014-02-28 NOTE — Patient Instructions (Signed)
[facility] Discharge Instructions  RECOMMENDATIONS MADE BY THE CONSULTANT AND ANY TEST RESULTS WILL BE SENT TO YOUR REFERRING PHYSICIAN.  EXAM FINDINGS BY THE PHYSICIAN TODAY AND SIGNS OR SYMPTOMS TO REPORT TO CLINIC OR PRIMARY PHYSICIAN: Exam and findings as discussed by Dr.Formanek.  Will do labs today and scans next week. If any test results of concern we will contact you.  MEDICATIONS PRESCRIBED:  Complete current supply of capecitabine then stop.  INSTRUCTIONS/FOLLOW-UP: 4 months with labs and office visit.  Thank you for choosing [facility] to provide your oncology and hematology care.  To afford each patient quality time with our providers, please arrive at least 15 minutes before your scheduled appointment time.  With your help, our goal is to use those 15 minutes to complete the necessary work-up to ensure our physicians have the information they need to help with your evaluation and healthcare recommendations.    Effective January 1st, 2014, we ask that you re-schedule your appointment with our physicians should you arrive 10 or more minutes late for your appointment.  We strive to give you quality time with our providers, and arriving late affects you and other patients whose appointments are after yours.    Again, thank you for choosing [facility].  Our hope is that these requests will decrease the amount of time that you wait before being seen by our physicians.       _____________________________________________________________  Should you have questions after your visit to [facility], please contact our office at [phone] between the hours of 8:30 a.m. and 4:30 p.m.  Voicemails left after 4:30 p.m. will not be returned until the following business day.  For prescription refill requests, have your pharmacy contact our office with your prescription refill request.     _______________________________________________________________  We hope that we have given you very good care.  You may receive a patient satisfaction survey in the mail, please complete it and return it as soon as possible.  We value your feedback!  _______________________________________________________________  Have you asked about our STAR program?  STAR stands for Survivorship Training and Rehabilitation, and this is a nationally recognized cancer care program that focuses on survivorship and rehabilitation.  Cancer and cancer treatments may cause problems, such as, pain, making you feel tired and keeping you from doing the things that you need or want to do. Cancer rehabilitation can help. Our goal is to reduce these troubling effects and help you have the best quality of life possible.  You may receive a survey from a nurse that asks questions about your current state of health.  Based on the survey results, all eligible patients will be referred to the [facility] program for an evaluation so we can better serve you!  A frequently asked questions sheet is available upon request.

## 2014-02-28 NOTE — Progress Notes (Signed)
LABS FOR FERR,CEA,CMP,CBCD

## 2014-03-01 ENCOUNTER — Other Ambulatory Visit (HOSPITAL_COMMUNITY): Payer: Self-pay

## 2014-03-01 DIAGNOSIS — D539 Nutritional anemia, unspecified: Secondary | ICD-10-CM

## 2014-03-01 DIAGNOSIS — C20 Malignant neoplasm of rectum: Secondary | ICD-10-CM

## 2014-03-01 LAB — CEA: CEA: 3.2 ng/mL (ref 0.0–5.0)

## 2014-03-07 ENCOUNTER — Ambulatory Visit (HOSPITAL_COMMUNITY)
Admission: RE | Admit: 2014-03-07 | Discharge: 2014-03-07 | Disposition: A | Discharge status: Home / Self Care | Payer: PRIVATE HEALTH INSURANCE | Source: Ambulatory Visit | Attending: Hematology and Oncology | Admitting: Hematology and Oncology

## 2014-03-07 DIAGNOSIS — R918 Other nonspecific abnormal finding of lung field: Secondary | ICD-10-CM | POA: Diagnosis not present

## 2014-03-07 DIAGNOSIS — C20 Malignant neoplasm of rectum: Secondary | ICD-10-CM

## 2014-03-07 DIAGNOSIS — Z933 Colostomy status: Secondary | ICD-10-CM | POA: Diagnosis not present

## 2014-03-07 DIAGNOSIS — N261 Atrophy of kidney (terminal): Secondary | ICD-10-CM | POA: Insufficient documentation

## 2014-03-07 DIAGNOSIS — M4814 Ankylosing hyperostosis [Forestier], thoracic region: Secondary | ICD-10-CM | POA: Insufficient documentation

## 2014-03-07 MED ORDER — IOHEXOL 300 MG/ML  SOLN
100.0000 mL | Freq: Once | INTRAMUSCULAR | Status: AC | PRN
  Administered 2014-03-07: 100 mL via INTRAVENOUS

## 2014-03-16 ENCOUNTER — Other Ambulatory Visit (HOSPITAL_COMMUNITY): Payer: Self-pay | Admitting: Oncology

## 2014-03-28 ENCOUNTER — Telehealth (INDEPENDENT_AMBULATORY_CARE_PROVIDER_SITE_OTHER): Payer: Self-pay | Admitting: *Deleted

## 2014-03-28 ENCOUNTER — Other Ambulatory Visit (INDEPENDENT_AMBULATORY_CARE_PROVIDER_SITE_OTHER): Payer: Self-pay | Admitting: *Deleted

## 2014-03-28 DIAGNOSIS — C20 Malignant neoplasm of rectum: Secondary | ICD-10-CM

## 2014-03-28 DIAGNOSIS — C189 Malignant neoplasm of colon, unspecified: Secondary | ICD-10-CM

## 2014-03-28 DIAGNOSIS — D649 Anemia, unspecified: Secondary | ICD-10-CM

## 2014-03-28 NOTE — Telephone Encounter (Signed)
Patient needs trilyte 

## 2014-03-31 ENCOUNTER — Encounter (HOSPITAL_COMMUNITY): Payer: Self-pay | Admitting: Oral Surgery

## 2014-04-01 MED ORDER — PEG 3350-KCL-NA BICARB-NACL 420 G PO SOLR
4000.0000 mL | Freq: Once | ORAL | Status: DC
Start: 2014-04-01 — End: 2014-05-12

## 2014-04-26 ENCOUNTER — Encounter (HOSPITAL_COMMUNITY): Payer: Medicare Other | Attending: Hematology and Oncology | Admitting: Oncology

## 2014-04-26 VITALS — BP 177/75 | HR 101 | Temp 97.4°F | Resp 20 | Wt 212.7 lb

## 2014-04-26 DIAGNOSIS — C20 Malignant neoplasm of rectum: Secondary | ICD-10-CM | POA: Insufficient documentation

## 2014-04-26 DIAGNOSIS — R1031 Right lower quadrant pain: Secondary | ICD-10-CM

## 2014-04-26 DIAGNOSIS — D649 Anemia, unspecified: Secondary | ICD-10-CM | POA: Diagnosis present

## 2014-04-26 DIAGNOSIS — R911 Solitary pulmonary nodule: Secondary | ICD-10-CM | POA: Insufficient documentation

## 2014-04-26 DIAGNOSIS — G4700 Insomnia, unspecified: Secondary | ICD-10-CM | POA: Insufficient documentation

## 2014-04-26 DIAGNOSIS — R109 Unspecified abdominal pain: Secondary | ICD-10-CM | POA: Insufficient documentation

## 2014-04-26 LAB — COMPREHENSIVE METABOLIC PANEL
ALBUMIN: 4 g/dL (ref 3.5–5.2)
ALT: 16 U/L (ref 0–35)
AST: 18 U/L (ref 0–37)
Alkaline Phosphatase: 237 U/L — ABNORMAL HIGH (ref 39–117)
Anion gap: 5 (ref 5–15)
BILIRUBIN TOTAL: 0.4 mg/dL (ref 0.3–1.2)
BUN: 36 mg/dL — ABNORMAL HIGH (ref 6–23)
CO2: 27 mmol/L (ref 19–32)
Calcium: 8.2 mg/dL — ABNORMAL LOW (ref 8.4–10.5)
Chloride: 104 mmol/L (ref 96–112)
Creatinine, Ser: 1.39 mg/dL — ABNORMAL HIGH (ref 0.50–1.10)
GFR calc Af Amer: 46 mL/min — ABNORMAL LOW (ref >90)
GFR calc non Af Amer: 40 mL/min — ABNORMAL LOW (ref >90)
Glucose, Bld: 179 mg/dL — ABNORMAL HIGH (ref 70–99)
POTASSIUM: 4.8 mmol/L (ref 3.5–5.1)
Sodium: 136 mmol/L (ref 135–145)
Total Protein: 7.4 g/dL (ref 6.0–8.3)

## 2014-04-26 LAB — CBC WITH DIFFERENTIAL/PLATELET
Basophils Absolute: 0 10*3/uL (ref 0.0–0.1)
Basophils Relative: 0 % (ref 0–1)
Eosinophils Absolute: 0.2 10*3/uL (ref 0.0–0.7)
Eosinophils Relative: 4 % (ref 0–5)
HCT: 32.9 % — ABNORMAL LOW (ref 36.0–46.0)
HEMOGLOBIN: 10.4 g/dL — AB (ref 12.0–15.0)
LYMPHS ABS: 0.6 10*3/uL — AB (ref 0.7–4.0)
Lymphocytes Relative: 12 % (ref 12–46)
MCH: 29.1 pg (ref 26.0–34.0)
MCHC: 31.6 g/dL (ref 30.0–36.0)
MCV: 91.9 fL (ref 78.0–100.0)
MONO ABS: 0.3 10*3/uL (ref 0.1–1.0)
MONOS PCT: 6 % (ref 3–12)
NEUTROS ABS: 3.6 10*3/uL (ref 1.7–7.7)
NEUTROS PCT: 78 % — AB (ref 43–77)
Platelets: 199 10*3/uL (ref 150–400)
RBC: 3.58 MIL/uL — AB (ref 3.87–5.11)
RDW: 12.8 % (ref 11.5–15.5)
WBC: 4.7 10*3/uL (ref 4.0–10.5)

## 2014-04-26 NOTE — Assessment & Plan Note (Addendum)
Evidence of low-normal ferritin in past.  Will order an anemia panel and correct any vitamin deficiencies.  Presently on ferrous sulfate 325 mg PO daily.

## 2014-04-26 NOTE — Progress Notes (Signed)
Jennifer Bogus, MD [facility] [place] [address]  Incidental pulmonary nodule, greater than or equal to 69mm - Plan: CBC with Differential, Comprehensive metabolic panel, CEA, CT Chest Wo Contrast, CBC with Differential, Comprehensive metabolic panel, CEA  Rectal cancer - Plan: CBC with Differential, Comprehensive metabolic panel, CEA, CT Chest Wo Contrast, CBC with Differential, Comprehensive metabolic panel, CEA  Anemia, unspecified anemia type - Plan: CBC with Differential, Comprehensive metabolic panel, Vitamin Z99, Folate, Iron and TIBC, Ferritin, CBC with Differential, Comprehensive metabolic panel, Vitamin J57, Folate, Iron and TIBC, Ferritin  Right lower quadrant abdominal pain  Insomnia  CURRENT THERAPY:  INTERVAL HISTORY: [name] 64 y.o. female returns for followup of Locally advanced adenocarcinoma of the rectum, tolerating combined modality therapy well. Radiation therapy completed on 05/04/2013 which was also the last day she received Xeloda, excellent tolerance, status post APR on 07/26/2013 with marked cytoreduction documented as evidenced by no lymph node involvement. pT3 tumor was found.  S/P 6 months of Xeloda at 1000 mg BID 2 weeks on and 1 week off finishing in December 2015.     Rectal cancer   02/18/2013 Initial Diagnosis Rectal cancer   03/25/2013 - 05/05/2013 Radiation Therapy Pelvis treatment from 1/8- 2/12 with rectal boost from 2/13- 05/05/2013.   03/25/2013 - 05/05/2013 Chemotherapy Xeloda 1500 mg BID 5 days/week with radiation therapy.   07/26/2013 Definitive Surgery Dr. Arnoldo Morale- Flexible sigmoidoscopy, abdominoperineal resection, total abdominal hysterectomy with bilateral salpingo-oophorectomy   08/30/2013 - 03/01/2014 Chemotherapy Xeloda 1000 mg BID 14 days on and 7 days off x 6 months   03/08/2014 Imaging CT [facility]- Bilateral pulmonary nodules, including an 8 mm left lower lobe nodule. Metastatic disease is a concern. The left  lower lobe nodule has progressed since 03/05/2013, when it measured 5 mm.    Patient is seen at a close interval follow-up due to abnormal CT imaging studies ordered by Dr. Barnet Glasgow (Hem/Onc [facility]) and completed at the end of December 2015.  I personally reviewed and went over radiographic studies with the patient.  The results are noted within this dictation.     She complains of right lower abdominal pain.  She notes it is worse with movement.  Palpation does not effect pain.  She denies any difficulty with urination.  She notes that she moves her bowels nightly.    She also complains of insomnia.  This may be related to her pain.  This is not likely oncologically related and therefore, I will defer these issues to her primary care provider, particularly since the patient did not come with a complete medication list.  I only had 2/4 pages.   Oncologically, she denies any complaints and ROS questioning is negative.  Past Medical History  Diagnosis Date  . Hypertension   . Diabetes mellitus     years  . Asthma   . Depression   . Mental retardation   . History of recurrent UTIs   . Shortness of breath     with exertion  . Cancer     Rectal   . Arthritis   . Gout   . Seizures     more than 4 yrs since last seizure. UNknown etiology  . Rectal cancer     has DIABETES MELLITUS, TYPE II, UNCONTROLLED; HYPERLIPIDEMIA; GOUT NOS; Anemia; DEPRESSION; RETARDATION, MENTAL NOS; HYPERTENSION; SINUS TACHYCARDIA; ALLERGIC RHINITIS; ASTHMA; ASTHMA, WITH ACUTE EXACERBATION; DISEASE, PANCREAS NOS; INTERTRIGO, CANDIDAL; ARTHRITIS; SEIZURE DISORDER; UNSPECIFIED SLEEP DISTURBANCE;  FLANK PAIN, LEFT; LIVER FUNCTION TESTS, ABNORMAL; MIGRAINES, HX OF; Elevated liver enzymes; Helicobacter pylori gastritis; Rectal cancer; Incidental pulmonary nodule, greater than or equal to 5mm; Abdominal pain; and Insomnia on her problem list.     has No Known Allergies.  Ms. Cumbo had no medications administered  during this visit.  Past Surgical History  Procedure Laterality Date  . Abdominal hysterectomy    . Multiple extractions with alveoloplasty N/A 11/23/2012    Procedure: MULTIPLE EXTRACION 1, 2, 4, 5, 6, 7, 8, 9, 10, 11, 12, 13, 14, 17, 18, 20, 23, 24, 25, 26, 28, 29, 32 WITH ALVEOLOPLASTY, REMOVE BILATERAL TORI;  Surgeon: Gae Bon, DDS;  Location: [facility];  Service: Oral Surgery;  Laterality: N/A;  . Colonoscopy with esophagogastroduodenoscopy (egd) N/A 01/14/2013    Procedure: COLONOSCOPY WITH ESOPHAGOGASTRODUODENOSCOPY (EGD);  Surgeon: Rogene Houston, MD;  [facility];  Service: Endoscopy;  Laterality: N/A;  250-moved to 315 Ann to notify pt  . Colonoscopy N/A 02/04/2013    Procedure: COLONOSCOPY;  Surgeon: Rogene Houston, MD;  [facility];  Service: Endoscopy;  Laterality: N/A;  225  . Eus N/A 02/18/2013    Procedure: LOWER ENDOSCOPIC ULTRASOUND (EUS);  Surgeon: Milus Banister, MD;  Location: [facility];  Service: Endoscopy;  Laterality: N/A;  . Flexible sigmoidoscopy N/A 07/26/2013    Procedure: FLEXIBLE SIGMOIDOSCOPY;  Surgeon: Jamesetta So, MD;  Location[facility];  Service: General;  Laterality: N/A;  . Abdominal perineal bowel resection N/A 07/26/2013    Procedure:  ABDOMINAL PERINEAL RESECTION;  Surgeon: Jamesetta So, MD;  Location[facility];  Service: General;  Laterality: N/A;  . Supracervical abdominal hysterectomy N/A 07/26/2013    Procedure: HYSTERECTOMY SUPRACERVICAL ABDOMINAL ;  Surgeon: Jamesetta So, MD;  Location[facility];  Service: General;  Laterality: N/A;  . Salpingoophorectomy Bilateral 07/26/2013    Procedure: SALPINGO OOPHORECTOMY;  Surgeon: Jamesetta So, MD;  Location[facility];  Service: General;  Laterality: Bilateral;  . Colostomy Left 07/26/2013    Procedure: COLOSTOMY;  Surgeon: Jamesetta So, MD;  Location[facility];  Service: General;  Laterality: Left;    Denies any headaches, dizziness, double vision, fevers, chills, night sweats,  nausea, vomiting, diarrhea, constipation, chest pain, heart palpitations, shortness of breath, blood in stool, black tarry stool, urinary pain, urinary burning, urinary frequency, hematuria.   PHYSICAL EXAMINATION  ECOG PERFORMANCE STATUS: 1 - Symptomatic but completely ambulatory  Filed Vitals:   04/26/14 1517  BP: 177/75  Pulse: 101  Temp: 97.4 F (36.3 C)  Resp: 20    GENERAL:alert, no distress, comfortable, cooperative, smiling and mentally handicapped SKIN: skin color, texture, turgor are normal, no rashes or significant lesions HEAD: Normocephalic, No masses, lesions, tenderness or abnormalities EYES: normal, PERRLA, EOMI, Conjunctiva are pink and non-injected EARS: External ears normal OROPHARYNX:mucous membranes are moist  NECK: supple, trachea midline LYMPH:  not examined BREAST:not examined LUNGS: clear to auscultation  HEART: regular rate & rhythm, no murmurs and no gallops ABDOMEN:abdomen soft, non-tender, obese, normal bowel sounds and no masses or organomegaly BACK: Back symmetric, no curvature. EXTREMITIES:less then 2 second capillary refill, no joint deformities, effusion, or inflammation, no skin discoloration, no cyanosis  NEURO: alert & oriented x 3 with fluent speech, no focal motor/sensory deficits, gait normal   LABORATORY DATA: CBC    Component Value Date/Time   WBC 4.1 02/28/2014 1029   RBC 2.98* 02/28/2014 1029   RBC 3.84* 11/07/2009 1649  HGB 9.1* 02/28/2014 1029   HCT 27.9* 02/28/2014 1029   PLT 177 02/28/2014 1029   MCV 93.6 02/28/2014 1029   MCH 30.5 02/28/2014 1029   MCHC 32.6 02/28/2014 1029   RDW 14.3 02/28/2014 1029   LYMPHSABS 0.7 02/28/2014 1029   MONOABS 0.4 02/28/2014 1029   EOSABS 0.2 02/28/2014 1029   BASOSABS 0.0 02/28/2014 1029      Chemistry      Component Value Date/Time   NA 136* 02/28/2014 1029   K 5.6* 02/28/2014 1029   CL 98 02/28/2014 1029   CO2 27 02/28/2014 1029   BUN 32* 02/28/2014 1029   CREATININE 1.20*  02/28/2014 1029   CREATININE 1.11* 02/08/2013 1451      Component Value Date/Time   CALCIUM 8.4 02/28/2014 1029   ALKPHOS 230* 02/28/2014 1029   AST 16 02/28/2014 1029   ALT 11 02/28/2014 1029   BILITOT <0.2* 02/28/2014 1029     Lab Results  Component Value Date   CEA 3.2 02/28/2014     RADIOGRAPHIC STUDIES:  03/07/2014  CLINICAL DATA: Subsequent encounter for rectal cancer. Status post resection.  EXAM: CT ABDOMEN AND PELVIS WITH CONTRAST  TECHNIQUE: Multidetector CT imaging of the abdomen and pelvis was performed using the standard protocol following bolus administration of intravenous contrast.  CONTRAST: 180mL OMNIPAQUE IOHEXOL 300 MG/ML SOLN  COMPARISON: PET-CT from 03/05/2013. Abdomen and pelvis CT scans from 02/09/2013 and 02/15/2009.  FINDINGS: Soft tissue / Mediastinum: There is no axillary lymphadenopathy. No mediastinal lymphadenopathy. No evidence for hilar lymphadenopathy. The heart size is normal. No pericardial effusion.  Lungs / Pleura: 3 mm right lower lobe pulmonary nodule noted on image 22 series 6. 8 mm left lower lobe pulmonary nodule is seen on image 18 series 6. No evidence for pleural effusion.  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 Mineralization within the thoracolumbar spine is heterogeneous in many vertebral bodies appear sclerotic. These changes in the lumbar spine, where visualized previously, have been stable since 2010 suggesting the features are unrelated to metastatic involvement.  Hepatobiliary: No focal abnormality within the liver parenchyma. There is no evidence for gallstones, gallbladder wall thickening, or pericholecystic fluid. No intrahepatic or extrahepatic biliary dilation.  Pancreas: No focal mass lesion. No dilatation of the main duct. No intraparenchymal cyst. No peripancreatic edema.  Spleen: No splenomegaly. No focal mass lesion.  Adrenals/Urinary Tract: No adrenal nodule or  mass. Right kidney is unremarkable. Left kidney is atrophic with changes of advanced scarring. No evidence for hydroureter. Bladder is decompressed.  Stomach/Bowel: Stomach is nondistended. No gastric wall thickening. No evidence of outlet obstruction. Duodenum is normally positioned as is the ligament of Treitz. No small bowel wall thickening. No small bowel dilatation. The terminal ileum and the appendix are normal. Left lower quadrant sigmoid end colostomy is noted with parastomal herniation of intra-abdominal fat.  There is abnormal soft tissue in the posterior pelvic floor, at the level of the rectal resection. Today's exam represents the patient's first postoperative scan in our system, and as such, this exam will serve as a new baseline. The soft tissue attenuation in the posterior pelvic floor and presacral space is presumably postsurgical, but close attention to this area on followup imaging will be required as recurrent disease cannot be excluded.  Vascular/Lymphatic: No abdominal aortic aneurysm. No gastrohepatic or hepatoduodenal ligament lymphadenopathy. No retroperitoneal lymphadenopathy. No pelvic sidewall lymphadenopathy.  Reproductive: Patient has undergone interval hysterectomy. No evidence for adnexal mass.  Other: No intraperitoneal free  fluid.  Musculoskeletal: Heterogeneous mineralization and sclerotic changes within the lumbar spine and bony pelvis as seen on end exam from 02/15/2009.  IMPRESSION: Bilateral pulmonary nodules, including an 8 mm left lower lobe nodule. Metastatic disease is a concern. The left lower lobe nodule has progressed since 03/05/2013, when it measured 5 mm.  Status post low anterior resection with left lower quadrant sigmoid end colostomy. Today's study [name] is is the patient's first postoperative baseline and abnormal soft tissue attenuation in the pelvic floor is presumably granulation tissue/scarring related to surgery,  but close attention will be required as recurrent disease cannot be excluded.  Heterogeneous mineralization in sclerosis is seen diffusely in the axial skeleton, but appears stable back to at least 2010, suggesting benign etiology.   Electronically Signed  By: Misty Stanley M.D.  On: 03/07/2014 10:58     ASSESSMENT AND PLAN:  Incidental pulmonary nodule, greater than or equal to 107mm CT imaging in December 2015 demonstrate bilateral pulmonary nodules, with the largest measuring 8 mm.  Given its size, a follow-up CT scan in March 2016 is indicated as it is below the threshold of PET imaging.  There is concern for recurrent metastatic disease given her history.  Will follow closely.  CT Chest without contrast in March 2016.   Rectal cancer S/P definitive treatment with a curative intent as outlined in the oncology history.  Labs today: CBC diff, CMET, CEA.   Anemia Evidence of low-normal ferritin in past.  Will order an anemia panel and correct any vitamin deficiencies.  Presently on ferrous sulfate 325 mg PO daily.   Abdominal pain Etiology unclear.  No tenderness to palpation.  No urinary or bowel complaints. Defer to primary care provider.   Insomnia ?secondary to abdominal pain.  Will defer to primary care provider.   THERAPY PLAN:  With incidentally found pulmonary nodules in December 2015 on CT imaging, a close interval follow-up CT scan is indicated and will be performed in the next 6 weeks or so.  Otherwise we will continue with surveillance per NCCN guidelines and work-up pulmonary nodules as indicated.  NCCN guidelines for surveillance for T3/T4, N0-2, M0 colorectal cancer are:   A. H+P every 3-6 months x 2 years and then every 6 months for a total of 5 years   B. CEA every 3 months x 2 years and then every 6 months for a total of 5 years   C. CT abd/pelvis annually for up to 5 years   D. Colonoscopy in 1 year except if no preoperative colonoscopy due to  obstructing lesion, colonoscopy in 3-6 months.    1. If advanced adenoma, repeat in 1 year   2. If no advanced adenoma, repeat in 3 years, then every 5 years  E. PET scan not routinely recommended.   All questions were answered. The patient knows to call the clinic with any problems, questions or concerns. We can certainly see the patient much sooner if necessary.  Patient and plan discussed with Dr. Ancil Linsey and she is in agreement with the aforementioned.   Emir Nack 04/26/2014

## 2014-04-26 NOTE — Assessment & Plan Note (Signed)
CT imaging in December 2015 demonstrate bilateral pulmonary nodules, with the largest measuring 8 mm.  Given its size, a follow-up CT scan in March 2016 is indicated as it is below the threshold of PET imaging.  There is concern for recurrent metastatic disease given her history.  Will follow closely.  CT Chest without contrast in March 2016.

## 2014-04-26 NOTE — Assessment & Plan Note (Signed)
?  secondary to abdominal pain.  Will defer to primary care provider.

## 2014-04-26 NOTE — Patient Instructions (Signed)
[place] at [facility]  Discharge Instructions:  CT scans in March 2016 Follow-up in March 2016 after CT scans.  _______________________________________________________________  Thank you for choosing [place] at [facility] to provide your oncology and hematology care.  To afford each patient quality time with our providers, please arrive at least 15 minutes before your scheduled appointment.  You need to re-schedule your appointment if you arrive 10 or more minutes late.  We strive to give you quality time with our providers, and arriving late affects you and other patients whose appointments are after yours.  Also, if you no show three or more times for appointments you may be dismissed from the clinic.  Again, thank you for choosing [place] at [place] hope is that these requests will allow you access to exceptional care and in a timely manner. _______________________________________________________________  If you have questions after your visit, please contact our office at [phone] between the hours of 8:30 a.m. and 5:00 p.m. Voicemails left after 4:30 p.m. will not be returned until the following business day. _______________________________________________________________  For prescription refill requests, have your pharmacy contact our office. _______________________________________________________________  Recommendations made by the consultant and any test results will be sent to your referring physician. _______________________________________________________________

## 2014-04-26 NOTE — Assessment & Plan Note (Signed)
Etiology unclear.  No tenderness to palpation.  No urinary or bowel complaints. Defer to primary care provider.

## 2014-04-26 NOTE — Progress Notes (Signed)
[name] presented for labwork. Labs per MD order drawn via Peripheral Line 23 gauge needle inserted in right antecubital.  Good blood return present. Procedure without incident.  Needle removed intact. Patient tolerated procedure well.

## 2014-04-26 NOTE — Assessment & Plan Note (Signed)
S/P definitive treatment with a curative intent as outlined in the oncology history.  Labs today: CBC diff, CMET, CEA.

## 2014-04-27 ENCOUNTER — Encounter (HOSPITAL_COMMUNITY): Payer: Self-pay | Admitting: Oncology

## 2014-04-27 ENCOUNTER — Other Ambulatory Visit (HOSPITAL_COMMUNITY): Payer: Self-pay | Admitting: Oncology

## 2014-04-27 DIAGNOSIS — E611 Iron deficiency: Secondary | ICD-10-CM | POA: Insufficient documentation

## 2014-04-27 DIAGNOSIS — N183 Chronic kidney disease, stage 3 unspecified: Secondary | ICD-10-CM

## 2014-04-27 HISTORY — DX: Chronic kidney disease, stage 3 unspecified: N18.30

## 2014-04-27 HISTORY — DX: Iron deficiency: E61.1

## 2014-04-27 LAB — CEA: CEA: 4.2 ng/mL (ref 0.0–4.7)

## 2014-04-27 LAB — IRON AND TIBC
Iron: 63 ug/dL (ref 42–145)
SATURATION RATIOS: 25 % (ref 20–55)
TIBC: 249 ug/dL — AB (ref 250–470)
UIBC: 186 ug/dL (ref 125–400)

## 2014-04-27 LAB — FOLATE: Folate: 13.6 ng/mL

## 2014-04-27 LAB — VITAMIN B12: Vitamin B-12: 362 pg/mL (ref 211–911)

## 2014-04-27 LAB — FERRITIN: FERRITIN: 46 ng/mL (ref 10–291)

## 2014-04-29 ENCOUNTER — Encounter (HOSPITAL_COMMUNITY): Payer: Self-pay

## 2014-04-29 ENCOUNTER — Encounter (HOSPITAL_BASED_OUTPATIENT_CLINIC_OR_DEPARTMENT_OTHER): Payer: Medicare Other

## 2014-04-29 VITALS — BP 147/64 | HR 69 | Temp 98.0°F | Resp 18

## 2014-04-29 DIAGNOSIS — E611 Iron deficiency: Secondary | ICD-10-CM

## 2014-04-29 DIAGNOSIS — D649 Anemia, unspecified: Secondary | ICD-10-CM

## 2014-04-29 MED ORDER — SODIUM CHLORIDE 0.9 % IV SOLN
INTRAVENOUS | Status: DC
  Administered 2014-04-29: 12:00:00 via INTRAVENOUS

## 2014-04-29 MED ORDER — SODIUM CHLORIDE 0.9 % IV SOLN
250.0000 mg | Freq: Once | INTRAVENOUS | Status: AC
  Administered 2014-04-29: 250 mg via INTRAVENOUS
  Filled 2014-04-29: qty 20

## 2014-04-29 NOTE — Progress Notes (Signed)
[name] Tolerated iron infusion well.  Discharged ambulatory

## 2014-04-29 NOTE — Patient Instructions (Signed)
[place] at [facility]  Discharge Instructions:  You had an iron infusion today.  Follow up as scheduled,  Call the clinic if you have any questions or concerns _______________________________________________________________  Thank you for choosing [place] at [facility] to provide your oncology and hematology care.  To afford each patient quality time with our providers, please arrive at least 15 minutes before your scheduled appointment.  You need to re-schedule your appointment if you arrive 10 or more minutes late.  We strive to give you quality time with our providers, and arriving late affects you and other patients whose appointments are after yours.  Also, if you no show three or more times for appointments you may be dismissed from the clinic.  Again, thank you for choosing [place] at [place] hope is that these requests will allow you access to exceptional care and in a timely manner. _______________________________________________________________  If you have questions after your visit, please contact our office at [phone] between the hours of 8:30 a.m. and 5:00 p.m. Voicemails left after 4:30 p.m. will not be returned until the following business day. _______________________________________________________________  For prescription refill requests, have your pharmacy contact our office. _______________________________________________________________  Recommendations made by the consultant and any test results will be sent to your referring physician. _______________________________________________________________

## 2014-05-04 NOTE — Progress Notes (Signed)
Patient is scheduled for colonoscopy later this month.

## 2014-05-06 ENCOUNTER — Encounter (HOSPITAL_BASED_OUTPATIENT_CLINIC_OR_DEPARTMENT_OTHER): Payer: Medicare Other

## 2014-05-06 VITALS — BP 151/73 | HR 73 | Temp 98.0°F | Resp 16

## 2014-05-06 DIAGNOSIS — E611 Iron deficiency: Secondary | ICD-10-CM

## 2014-05-06 MED ORDER — SODIUM CHLORIDE 0.9 % IV SOLN
INTRAVENOUS | Status: DC
  Administered 2014-05-06: 11:00:00 via INTRAVENOUS

## 2014-05-06 MED ORDER — SODIUM CHLORIDE 0.9 % IJ SOLN
10.0000 mL | Freq: Once | INTRAMUSCULAR | Status: AC
  Administered 2014-05-06: 10 mL via INTRAVENOUS

## 2014-05-06 MED ORDER — SODIUM CHLORIDE 0.9 % IV SOLN
250.0000 mg | Freq: Once | INTRAVENOUS | Status: AC
  Administered 2014-05-06: 250 mg via INTRAVENOUS
  Filled 2014-05-06: qty 20

## 2014-05-06 NOTE — Progress Notes (Signed)
Tolerated iron infusion well. 

## 2014-05-06 NOTE — Patient Instructions (Signed)
[place] at [facility] Discharge Instructions  RECOMMENDATIONS MADE BY THE CONSULTANT AND ANY TEST RESULTS WILL BE SENT TO YOUR REFERRING PHYSICIAN.  Ferric gluconate infusion #2 of 2 today as ordered. Reminder: CT scan in March. Return as scheduled for MD appointment in March. Report any issues/concerns as needed.  Thank you for choosing [place] at [facility] to provide your oncology and hematology care.  To afford each patient quality time with our provider, please arrive at least 15 minutes before your scheduled appointment time.    You need to re-schedule your appointment should you arrive 10 or more minutes late.  We strive to give you quality time with our providers, and arriving late affects you and other patients whose appointments are after yours.  Also, if you no show three or more times for appointments you may be dismissed from the clinic at the providers discretion.     Again, thank you for choosing [facility].  Our hope is that these requests will decrease the amount of time that you wait before being seen by our physicians.       _____________________________________________________________  Should you have questions after your visit to [facility], please contact our office at [phone] between the hours of 8:30 a.m. and 4:30 p.m.  Voicemails left after 4:30 p.m. will not be returned until the following business day.  For prescription refill requests, have your pharmacy contact our office.

## 2014-05-12 ENCOUNTER — Encounter (HOSPITAL_COMMUNITY)
Admission: RE | Disposition: A | Discharge status: Home / Self Care | Payer: Self-pay | Source: Ambulatory Visit | Attending: Internal Medicine

## 2014-05-12 ENCOUNTER — Ambulatory Visit (HOSPITAL_COMMUNITY)
Admission: RE | Admit: 2014-05-12 | Discharge: 2014-05-12 | Disposition: A | Discharge status: Home / Self Care | Payer: Medicare Other | Source: Ambulatory Visit | Attending: Internal Medicine | Admitting: Internal Medicine

## 2014-05-12 ENCOUNTER — Encounter (HOSPITAL_COMMUNITY): Payer: Self-pay | Admitting: *Deleted

## 2014-05-12 DIAGNOSIS — I129 Hypertensive chronic kidney disease with stage 1 through stage 4 chronic kidney disease, or unspecified chronic kidney disease: Secondary | ICD-10-CM | POA: Insufficient documentation

## 2014-05-12 DIAGNOSIS — N183 Chronic kidney disease, stage 3 (moderate): Secondary | ICD-10-CM | POA: Insufficient documentation

## 2014-05-12 DIAGNOSIS — Z90722 Acquired absence of ovaries, bilateral: Secondary | ICD-10-CM | POA: Insufficient documentation

## 2014-05-12 DIAGNOSIS — Z79899 Other long term (current) drug therapy: Secondary | ICD-10-CM | POA: Diagnosis not present

## 2014-05-12 DIAGNOSIS — Z9221 Personal history of antineoplastic chemotherapy: Secondary | ICD-10-CM | POA: Diagnosis not present

## 2014-05-12 DIAGNOSIS — F79 Unspecified intellectual disabilities: Secondary | ICD-10-CM | POA: Insufficient documentation

## 2014-05-12 DIAGNOSIS — R918 Other nonspecific abnormal finding of lung field: Secondary | ICD-10-CM | POA: Diagnosis not present

## 2014-05-12 DIAGNOSIS — M1990 Unspecified osteoarthritis, unspecified site: Secondary | ICD-10-CM | POA: Insufficient documentation

## 2014-05-12 DIAGNOSIS — E611 Iron deficiency: Secondary | ICD-10-CM | POA: Diagnosis not present

## 2014-05-12 DIAGNOSIS — E119 Type 2 diabetes mellitus without complications: Secondary | ICD-10-CM | POA: Diagnosis not present

## 2014-05-12 DIAGNOSIS — Z794 Long term (current) use of insulin: Secondary | ICD-10-CM | POA: Diagnosis not present

## 2014-05-12 DIAGNOSIS — Z90710 Acquired absence of both cervix and uterus: Secondary | ICD-10-CM | POA: Diagnosis not present

## 2014-05-12 DIAGNOSIS — Z87440 Personal history of urinary (tract) infections: Secondary | ICD-10-CM | POA: Diagnosis not present

## 2014-05-12 DIAGNOSIS — Z79891 Long term (current) use of opiate analgesic: Secondary | ICD-10-CM | POA: Diagnosis not present

## 2014-05-12 DIAGNOSIS — Z923 Personal history of irradiation: Secondary | ICD-10-CM | POA: Insufficient documentation

## 2014-05-12 DIAGNOSIS — D649 Anemia, unspecified: Secondary | ICD-10-CM

## 2014-05-12 DIAGNOSIS — Z791 Long term (current) use of non-steroidal anti-inflammatories (NSAID): Secondary | ICD-10-CM | POA: Insufficient documentation

## 2014-05-12 DIAGNOSIS — Z85048 Personal history of other malignant neoplasm of rectum, rectosigmoid junction, and anus: Secondary | ICD-10-CM | POA: Insufficient documentation

## 2014-05-12 DIAGNOSIS — M109 Gout, unspecified: Secondary | ICD-10-CM | POA: Insufficient documentation

## 2014-05-12 DIAGNOSIS — J45909 Unspecified asthma, uncomplicated: Secondary | ICD-10-CM | POA: Diagnosis not present

## 2014-05-12 DIAGNOSIS — K5730 Diverticulosis of large intestine without perforation or abscess without bleeding: Secondary | ICD-10-CM | POA: Diagnosis not present

## 2014-05-12 DIAGNOSIS — Z9889 Other specified postprocedural states: Secondary | ICD-10-CM | POA: Diagnosis not present

## 2014-05-12 DIAGNOSIS — Z7951 Long term (current) use of inhaled steroids: Secondary | ICD-10-CM | POA: Diagnosis not present

## 2014-05-12 DIAGNOSIS — Z87891 Personal history of nicotine dependence: Secondary | ICD-10-CM | POA: Insufficient documentation

## 2014-05-12 DIAGNOSIS — K5720 Diverticulitis of large intestine with perforation and abscess without bleeding: Secondary | ICD-10-CM

## 2014-05-12 DIAGNOSIS — Z08 Encounter for follow-up examination after completed treatment for malignant neoplasm: Secondary | ICD-10-CM | POA: Diagnosis present

## 2014-05-12 DIAGNOSIS — C20 Malignant neoplasm of rectum: Secondary | ICD-10-CM

## 2014-05-12 HISTORY — PX: COLONOSCOPY: SHX5424

## 2014-05-12 LAB — GLUCOSE, CAPILLARY: Glucose-Capillary: 81 mg/dL (ref 70–99)

## 2014-05-12 SURGERY — COLONOSCOPY
Anesthesia: Moderate Sedation

## 2014-05-12 MED ORDER — MIDAZOLAM HCL 5 MG/5ML IJ SOLN
INTRAMUSCULAR | Status: AC
  Filled 2014-05-12: qty 10

## 2014-05-12 MED ORDER — MEPERIDINE HCL 50 MG/ML IJ SOLN
INTRAMUSCULAR | Status: AC
  Filled 2014-05-12: qty 1

## 2014-05-12 MED ORDER — SODIUM CHLORIDE 0.9 % IV SOLN: INTRAVENOUS | Status: DC

## 2014-05-12 MED ORDER — MEPERIDINE HCL 50 MG/ML IJ SOLN
INTRAMUSCULAR | Status: DC | PRN
  Administered 2014-05-12 (×2): 25 mg via INTRAVENOUS

## 2014-05-12 MED ORDER — MIDAZOLAM HCL 5 MG/5ML IJ SOLN
INTRAMUSCULAR | Status: DC | PRN
  Administered 2014-05-12 (×2): 1 mg via INTRAVENOUS
  Administered 2014-05-12: 2 mg via INTRAVENOUS
  Administered 2014-05-12: 1 mg via INTRAVENOUS

## 2014-05-12 NOTE — Op Note (Addendum)
COLONOSCOPY PROCEDURE REPORT  PATIENT:  [name]  MR#:  [id number] Birthdate:  1951-01-18, 64 y.o., female Endoscopist:  Dr. Rogene Houston, MD  Procedure Date: 05/12/2014  Procedure:   Colonoscopy via sigmoid colostomy.  Indications:  Patient is 64 year old African-American female who has history of rectal carcinoma diagnosed 1 year ago. She underwent APR in May last year after undergoing adjuvant chemoradiation therapy. She is returning for surveillance colonoscopy. She has few pulmonary nodules in being observed for now. She denies melena or bleeding enterocolostomy.  Informed Consent:  The procedure and risks were reviewed with the patient and informed consent was obtained. Informed consent for the procedure was obtained from her guardian or power of attorney [name].  Medications:  Demerol 50 mg IV Versed 5 mg IV  Description of procedure:  After a digital exam of colostomy was performed, colonoscope was advanced to the area of the cecum, ileocecal valve and appendiceal orifice. The cecum was deeply intubated. These structures were well-seen and photographed for the record. From the level of the cecum and ileocecal valve, the scope was slowly and cautiously withdrawn. The mucosal surfaces were carefully surveyed utilizing scope tip to flexion to facilitate fold flattening as needed.   Findings:   Prep satisfactory. Redundant colon with few scattered diverticula throughout the colon. No polyps or other abnormalities noted.   Therapeutic/Diagnostic Maneuvers Performed:   None  Complications:  None  Cecal Withdrawal Time:  8 minutes  Impression:  Few small scattered diverticula otherwise normal colonoscopy via sigmoid colostomy.  Recommendations:  Standard instructions given. Next colonoscopy in 3 years and if it is negative interval could be stretched to every 5 years.  [name]  05/12/2014 11:04 AM  CC: Dr. [name], MD & Dr. Rayne Du ref.  provider found CC: Robynn Pane, [place]

## 2014-05-12 NOTE — Discharge Instructions (Addendum)
Resume usual medications and diet. Next colonoscopy in 3 years.        Colonoscopy, Care After These instructions give you information on caring for yourself after your procedure. Your doctor may also give you more specific instructions. Call your doctor if you have any problems or questions after your procedure. HOME CARE  Do not drive for 24 hours.  Do not sign important papers or use machinery for 24 hours.  You may shower.  You may go back to your usual activities, but go slower for the first 24 hours.  Take rest breaks often during the first 24 hours.  Walk around or use warm packs on your belly (abdomen) if you have belly cramping or gas.  Drink enough fluids to keep your pee (urine) clear or pale yellow.  Resume your normal diet. Avoid heavy or fried foods.  Avoid drinking alcohol for 24 hours or as told by your doctor.  Only take medicines as told by your doctor. If a tissue sample (biopsy) was taken during the procedure:   Do not take aspirin or blood thinners for 7 days, or as told by your doctor.  Do not drink alcohol for 7 days, or as told by your doctor.  Eat soft foods for the first 24 hours. GET HELP IF: You still have a small amount of blood in your poop (stool) 2-3 days after the procedure. GET HELP RIGHT AWAY IF:  You have more than a small amount of blood in your poop.  You see clumps of tissue (blood clots) in your poop.  Your belly is puffy (swollen).  You feel sick to your stomach (nauseous) or throw up (vomit).  You have a fever.  You have belly pain that gets worse and medicine does not help. MAKE SURE YOU:  Understand these instructions.  Will watch your condition.  Will get help right away if you are not doing well or get worse. Document Released: 04/06/2010 Document Revised: 03/09/2013 Document Reviewed: 11/09/2012 [facility] Patient Information 2015 [place], [place]. This information is not intended to replace advice given to you  by your health care provider. Make sure you discuss any questions you have with your health care provider.    Diverticulosis Diverticulosis is the condition that develops when small pouches (diverticula) form in the wall of your colon. Your colon, or large intestine, is where water is absorbed and stool is formed. The pouches form when the inside layer of your colon pushes through weak spots in the outer layers of your colon. CAUSES  No one knows exactly what causes diverticulosis. RISK FACTORS  Being older than 49. Your risk for this condition increases with age. Diverticulosis is rare in people younger than 40 years. By age 48, almost everyone has it.  Eating a low-fiber diet.  Being frequently constipated.  Being overweight.  Not getting enough exercise.  Smoking.  Taking over-the-counter pain medicines, like aspirin and ibuprofen. SYMPTOMS  Most people with diverticulosis do not have symptoms. DIAGNOSIS  Because diverticulosis often has no symptoms, health care providers often discover the condition during an exam for other colon problems. In many cases, a health care provider will diagnose diverticulosis while using a flexible scope to examine the colon (colonoscopy). TREATMENT  If you have never developed an infection related to diverticulosis, you may not need treatment. If you have had an infection before, treatment may include:  Eating more fruits, vegetables, and grains.  Taking a fiber supplement.  Taking a live bacteria supplement (probiotic).  Taking medicine to relax your colon. HOME CARE INSTRUCTIONS   Drink at least 6-8 glasses of water each day to prevent constipation.  Try not to strain when you have a bowel movement.  Keep all follow-up appointments. If you have had an infection before:  Increase the fiber in your diet as directed by your health care provider or dietitian.  Take a dietary fiber supplement if your health care provider  approves.  Only take medicines as directed by your health care provider. SEEK MEDICAL CARE IF:   You have abdominal pain.  You have bloating.  You have cramps.  You have not gone to the bathroom in 3 days. SEEK IMMEDIATE MEDICAL CARE IF:   Your pain gets worse.  Yourbloating becomes very bad.  You have a fever or chills, and your symptoms suddenly get worse.  You begin vomiting.  You have bowel movements that are bloody or black. MAKE SURE YOU:  Understand these instructions.  Will watch your condition.  Will get help right away if you are not doing well or get worse. Document Released: 11/30/2003 Document Revised: 03/09/2013 Document Reviewed: 01/27/2013 [facility] Patient Information 2015 [place], [place]. This information is not intended to replace advice given to you by your health care provider. Make sure you discuss any questions you have with your health care provider.

## 2014-05-12 NOTE — H&P (Signed)
[name] is an 64 y.o. female.   Chief Complaint: Patient is here for colonoscopy. HPI: Patient 64 year old African-American female with multiple medical problems including mental retardation who was found to have rectal carcinoma in February last year. Was locally invasive disease this tunnel rectal ultrasound which revealed T3 N1 lesion. She was treated with chemoradiation finally had a when necessary May last year. She remains in remission although she has few small pulmonary nodules which are being watched. She has had intermittent right flank pain. She denies bleeding or melena and colostomy. She also denies immature area. She has good appetite and her weight has been stable. Informed consent obtained from her power of attorney Ms. Wilfred Curtis.  Past Medical History  Diagnosis Date  . Hypertension   . Diabetes mellitus     years  . Asthma   . Depression   . Mental retardation   . History of recurrent UTIs   . Shortness of breath     with exertion  . Cancer     Rectal   . Arthritis   . Gout   . Seizures     more than 4 yrs since last seizure. UNknown etiology  . Rectal cancer   . Iron deficiency 04/27/2014  . Chronic renal disease, stage 3, moderately decreased glomerular filtration rate between 30-59 mL/min/1.73 square meter 04/27/2014    Past Surgical History  Procedure Laterality Date  . Abdominal hysterectomy    . Multiple extractions with alveoloplasty N/A 11/23/2012    Procedure: MULTIPLE EXTRACION 1, 2, 4, 5, 6, 7, 8, 9, 10, 11, 12, 13, 14, 17, 18, 20, 23, 24, 25, 26, 28, 29, 32 WITH ALVEOLOPLASTY, REMOVE BILATERAL TORI;  Surgeon: Gae Bon, DDS;  Location: [facility];  Service: Oral Surgery;  Laterality: N/A;  . Colonoscopy with esophagogastroduodenoscopy (egd) N/A 01/14/2013    Procedure: COLONOSCOPY WITH ESOPHAGOGASTRODUODENOSCOPY (EGD);  Surgeon: Rogene Houston, MD;  [facility];  Service: Endoscopy;  Laterality: N/A;  250-moved to 315 Ann to notify pt   . Colonoscopy N/A 02/04/2013    Procedure: COLONOSCOPY;  Surgeon: Rogene Houston, MD;  [facility];  Service: Endoscopy;  Laterality: N/A;  225  . Eus N/A 02/18/2013    Procedure: LOWER ENDOSCOPIC ULTRASOUND (EUS);  Surgeon: Milus Banister, MD;  Location: [facility];  Service: Endoscopy;  Laterality: N/A;  . Flexible sigmoidoscopy N/A 07/26/2013    Procedure: FLEXIBLE SIGMOIDOSCOPY;  Surgeon: Jamesetta So, MD;  Location: [facility];  Service: General;  Laterality: N/A;  . Abdominal perineal bowel resection N/A 07/26/2013    Procedure:  ABDOMINAL PERINEAL RESECTION;  Surgeon: Jamesetta So, MD;  Location: [facility];  Service: General;  Laterality: N/A;  . Supracervical abdominal hysterectomy N/A 07/26/2013    Procedure: HYSTERECTOMY SUPRACERVICAL ABDOMINAL ;  Surgeon: Jamesetta So, MD;  Location: [facility];  Service: General;  Laterality: N/A;  . Salpingoophorectomy Bilateral 07/26/2013    Procedure: SALPINGO OOPHORECTOMY;  Surgeon: Jamesetta So, MD;  Location[facility];  Service: General;  Laterality: Bilateral;  . Colostomy Left 07/26/2013    Procedure: COLOSTOMY;  Surgeon: Jamesetta So, MD;  Location: [facility];  Service: General;  Laterality: Left;    History reviewed. No pertinent family history. Social History:  reports that she quit smoking about 5 years ago. Her smoking use included Cigarettes. She has never used smokeless tobacco. She reports that she does not drink alcohol or use illicit drugs.  Allergies: No Known Allergies  Medications Prior to Admission  Medication Sig Dispense Refill  . amLODipine (NORVASC) 2.5 MG tablet Take 2.5 mg by mouth daily.    [name] aspirin EC 81 MG tablet Take 81 mg by mouth daily.    . diphenoxylate-atropine (LOMOTIL) 2.5-0.025 MG per tablet Take 1 tablet by mouth every 2 (two) hours as needed for diarrhea or loose stools. 100 tablet 0  . docusate sodium (COLACE) 100 MG capsule Take 100 mg by mouth 2 (two) times daily.    . ferrous sulfate 325 (65  FE) MG tablet TAKE (1) TABLET BY MOUTH TWICE DAILY. 60 tablet 5  . fish oil-omega-3 fatty acids 1000 MG capsule Take 1 g by mouth 2 (two) times daily.    . Fluticasone-Salmeterol (ADVAIR) 250-50 MCG/DOSE AEPB Inhale 1 puff into the lungs every 12 (twelve) hours.    [name] ibuprofen (ADVIL,MOTRIN) 600 MG tablet Take 600 mg by mouth every 6 (six) hours as needed for moderate pain.     . Insulin Glargine (LANTUS SOLOSTAR) 100 UNIT/ML SOPN Inject 15 Units into the skin at bedtime.     [name] loratadine-pseudoephedrine (LORATADINE-D 24HR) 10-240 MG per 24 hr tablet Take 1 tablet by mouth daily.    . metFORMIN (GLUCOPHAGE) 1000 MG tablet Take 1,000 mg by mouth 2 (two) times daily with a meal.    . metoprolol succinate (TOPROL-XL) 50 MG 24 hr tablet Take 100 mg by mouth at bedtime. Take with or immediately following a meal.    . montelukast (SINGULAIR) 10 MG tablet Take 10 mg by mouth at bedtime.    [name] omega-3 acid ethyl esters (LOVAZA) 1 G capsule Take 1 g by mouth 2 (two) times daily.    . pantoprazole (PROTONIX) 40 MG tablet Take 40 mg by mouth daily.    . phenytoin (DILANTIN) 100 MG ER capsule Take 100 mg by mouth 2 (two) times daily.    . polyethylene glycol-electrolytes (NULYTELY/GOLYTELY) 420 G solution Take 4,000 mLs by mouth once. 4000 mL 0  . pravastatin (PRAVACHOL) 40 MG tablet Take 40 mg by mouth daily.    . silver sulfADIAZINE (SILVADENE) 1 % cream Apply 1 application topically daily.    . sodium chloride (OCEAN) 0.65 % SOLN nasal spray Place 2 sprays into both nostrils 4 (four) times daily as needed for congestion.    . traMADol (ULTRAM) 50 MG tablet Take by mouth every 6 (six) hours as needed.    . triamcinolone cream (KENALOG) 0.1 % Apply 1 application topically daily. To legs    . Zinc Oxide (BOUDREAUXS BUTT PASTE EX) Apply topically 2 (two) times daily.      Results for orders placed or performed during the hospital encounter of 05/12/14 (from the past 48 hour(s))  Glucose, capillary      Status: None   Collection Time: 05/12/14  9:59 AM  Result Value Ref Range   Glucose-Capillary 81 70 - 99 mg/dL   Comment 1 Document in Chart    No results found.  ROS  Blood pressure 165/83, pulse 89, temperature 98.1 F (36.7 C), resp. rate 18, SpO2 100 %. Physical Exam  Constitutional: She appears well-developed and well-nourished.  HENT:  Mouth/Throat: Oropharynx is clear and moist.  Eyes: Conjunctivae are normal. No scleral icterus.  Neck: No thyromegaly present.  Cardiovascular: Normal rate, regular rhythm and normal heart sounds.   No murmur heard. Respiratory: Effort normal and breath sounds normal.  GI:  Abdomen is full with colostomy in left lower quadrant for abdomen. Soft and  nontender without organomegaly or masses.  Lymphadenopathy:    She has no cervical adenopathy.     Assessment/Plan History of rectal carcinoma. Status post APR preceded by adjuvant chemotherapy and radiation therapy. Surveillance colonoscopy via colostomy.  Tavonte Seybold U 05/12/2014, 10:20 AM

## 2014-05-13 ENCOUNTER — Encounter (HOSPITAL_COMMUNITY): Payer: Self-pay | Admitting: Internal Medicine

## 2014-05-26 ENCOUNTER — Ambulatory Visit (HOSPITAL_COMMUNITY)
Admission: RE | Admit: 2014-05-26 | Discharge: 2014-05-26 | Disposition: A | Discharge status: Home / Self Care | Payer: Medicare Other | Source: Ambulatory Visit | Attending: Oncology | Admitting: Oncology

## 2014-05-26 DIAGNOSIS — R911 Solitary pulmonary nodule: Secondary | ICD-10-CM | POA: Diagnosis present

## 2014-05-26 DIAGNOSIS — I10 Essential (primary) hypertension: Secondary | ICD-10-CM | POA: Insufficient documentation

## 2014-05-26 DIAGNOSIS — C20 Malignant neoplasm of rectum: Secondary | ICD-10-CM | POA: Insufficient documentation

## 2014-05-26 DIAGNOSIS — E119 Type 2 diabetes mellitus without complications: Secondary | ICD-10-CM | POA: Insufficient documentation

## 2014-05-30 ENCOUNTER — Encounter (HOSPITAL_COMMUNITY): Payer: Self-pay | Admitting: Oncology

## 2014-05-30 ENCOUNTER — Encounter (HOSPITAL_COMMUNITY): Payer: Medicare Other | Attending: Hematology and Oncology | Admitting: Oncology

## 2014-05-30 VITALS — BP 182/76 | HR 95 | Temp 97.0°F | Resp 18 | Wt 212.9 lb

## 2014-05-30 DIAGNOSIS — R911 Solitary pulmonary nodule: Secondary | ICD-10-CM

## 2014-05-30 DIAGNOSIS — D539 Nutritional anemia, unspecified: Secondary | ICD-10-CM

## 2014-05-30 DIAGNOSIS — D649 Anemia, unspecified: Secondary | ICD-10-CM | POA: Insufficient documentation

## 2014-05-30 DIAGNOSIS — I10 Essential (primary) hypertension: Secondary | ICD-10-CM

## 2014-05-30 DIAGNOSIS — C20 Malignant neoplasm of rectum: Secondary | ICD-10-CM | POA: Diagnosis not present

## 2014-05-30 LAB — CBC WITH DIFFERENTIAL/PLATELET
BASOS PCT: 1 % (ref 0–1)
Basophils Absolute: 0 10*3/uL (ref 0.0–0.1)
EOS PCT: 6 % — AB (ref 0–5)
Eosinophils Absolute: 0.3 10*3/uL (ref 0.0–0.7)
HCT: 32.5 % — ABNORMAL LOW (ref 36.0–46.0)
Hemoglobin: 10.3 g/dL — ABNORMAL LOW (ref 12.0–15.0)
LYMPHS PCT: 11 % — AB (ref 12–46)
Lymphs Abs: 0.5 10*3/uL — ABNORMAL LOW (ref 0.7–4.0)
MCH: 28.9 pg (ref 26.0–34.0)
MCHC: 31.7 g/dL (ref 30.0–36.0)
MCV: 91.3 fL (ref 78.0–100.0)
MONO ABS: 0.4 10*3/uL (ref 0.1–1.0)
Monocytes Relative: 9 % (ref 3–12)
NEUTROS ABS: 3.2 10*3/uL (ref 1.7–7.7)
Neutrophils Relative %: 73 % (ref 43–77)
Platelets: 184 10*3/uL (ref 150–400)
RBC: 3.56 MIL/uL — ABNORMAL LOW (ref 3.87–5.11)
RDW: 12.9 % (ref 11.5–15.5)
WBC: 4.3 10*3/uL (ref 4.0–10.5)

## 2014-05-30 LAB — COMPREHENSIVE METABOLIC PANEL
ALK PHOS: 257 U/L — AB (ref 39–117)
ALT: 16 U/L (ref 0–35)
AST: 18 U/L (ref 0–37)
Albumin: 3.6 g/dL (ref 3.5–5.2)
Anion gap: 6 (ref 5–15)
BUN: 31 mg/dL — ABNORMAL HIGH (ref 6–23)
CO2: 30 mmol/L (ref 19–32)
Calcium: 9 mg/dL (ref 8.4–10.5)
Chloride: 101 mmol/L (ref 96–112)
Creatinine, Ser: 0.92 mg/dL (ref 0.50–1.10)
GFR, EST AFRICAN AMERICAN: 76 mL/min — AB (ref >90)
GFR, EST NON AFRICAN AMERICAN: 65 mL/min — AB (ref >90)
Glucose, Bld: 80 mg/dL (ref 70–99)
Potassium: 4.8 mmol/L (ref 3.5–5.1)
Sodium: 137 mmol/L (ref 135–145)
TOTAL PROTEIN: 7.5 g/dL (ref 6.0–8.3)
Total Bilirubin: 0.3 mg/dL (ref 0.3–1.2)

## 2014-05-30 MED ORDER — AMLODIPINE BESYLATE 5 MG PO TABS: 5.0000 mg | ORAL_TABLET | Freq: Every day | ORAL | Status: DC

## 2014-05-30 NOTE — Patient Instructions (Addendum)
..  [facility] Discharge Instructions  RECOMMENDATIONS MADE BY THE CONSULTANT AND ANY TEST RESULTS WILL BE SENT TO YOUR REFERRING PHYSICIAN. Order sent to increase amlodipine to 5 mg daily and will communicate this to Dr. Luan Pulling  Labs today Labs in 6 weeks Labs in 12 weeks  CT scan of chest in 12 weeks  Return in 12 weeks for follow-up  Thank you for choosing [facility] at [facility] to provide your oncology and hematology care.  To afford each patient quality time with our provider, please arrive at least 15 minutes before your scheduled appointment time.    You need to re-schedule your appointment should you arrive 10 or more minutes late.  We strive to give you quality time with our providers, and arriving late affects you and other patients whose appointments are after yours.  Also, if you no show three or more times for appointments you may be dismissed from the clinic at the providers discretion.     Again, thank you for choosing [facility].  Our hope is that these requests will decrease the amount of time that you wait before being seen by our physicians.       _____________________________________________________________  Should you have questions after your visit to [facility], please contact our office at [phone] between the hours of 8:30 a.m. and 4:30 p.m.  Voicemails left after 4:30 p.m. will not be returned until the following business day.  For prescription refill requests, have your pharmacy contact our office.

## 2014-05-30 NOTE — Progress Notes (Signed)
Jennifer Bogus, MD [facility] [place] [id number]  Incidental pulmonary nodule, greater than or equal to 23mm - Plan: CT Chest Wo Contrast  Rectal cancer - Plan: CBC with Differential, Comprehensive metabolic panel, CEA, Ferritin, CBC with Differential, Comprehensive metabolic panel, CEA  Deficiency anemia - Plan: CBC with Differential, Comprehensive metabolic panel, CEA, Ferritin  Essential hypertension - Plan: amLODipine (NORVASC) 5 MG tablet  CURRENT THERAPY: Surveillance per NCCN guidelines  INTERVAL HISTORY: [name] 64 y.o. female returns for followup of Locally advanced adenocarcinoma of the rectum, tolerating combined modality therapy well. Radiation therapy completed on 05/04/2013 which was also the last day she received Xeloda, excellent tolerance, status post APR on 07/26/2013 with marked cytoreduction documented as evidenced by no lymph node involvement. pT3 tumor was found. S/P 6 months of Xeloda at 1000 mg BID 2 weeks on and 1 week off finishing in December 2015.     Rectal cancer   02/18/2013 Initial Diagnosis Rectal cancer   03/25/2013 - 05/05/2013 Radiation Therapy Pelvis treatment from 1/8- 2/12 with rectal boost from 2/13- 05/05/2013.   03/25/2013 - 05/05/2013 Chemotherapy Xeloda 1500 mg BID 5 days/week with radiation therapy.   07/26/2013 Definitive Surgery Dr. Arnoldo Morale- Flexible sigmoidoscopy, abdominoperineal resection, total abdominal hysterectomy with bilateral salpingo-oophorectomy   08/30/2013 - 03/01/2014 Chemotherapy Xeloda 1000 mg BID 14 days on and 7 days off x 6 months   03/08/2014 Imaging CT CAP- Bilateral pulmonary nodules, including an 8 mm left lower lobe nodule. Metastatic disease is a concern. The left lower lobe nodule has progressed since 03/05/2013, when it measured 5 mm.   05/26/2014 Imaging CT Chest- Continued enlargement of the superior segment left lower lobe nodule. Malignancy is likely.   In contrast, the right  lower lobe nodule is probably benign.    Patient is here to follow-up on CT of chest imaging that was performed recently. I personally reviewed and went over radiographic studies with the patient.  The results are noted within this dictation.  CT of chest is concerning for: Continued enlargement of the superior segment left lower lobe nodule. Malignancy is likely.  I personally reviewed and went over laboratory results with the patient.  The results are noted within this dictation.  We will update these today.  Given the small nature of the 8 mm pulmonary nodule, it is reasonable to follow with repeat CT imaging in 12 weeks.  Once it reaches the threshold of 1 cm, I will set the patient up for a PET scan and likely a referral to CTS for biopsy.    The patient and her DSS (Vickie) representative are agreeable to this plan.    Her BP is elevated at 182/76 which is historically stable.  Therefore, I will increase Norvasc 2.5 mg to 5 mg daily.  I will request her primary care provider to follow-up with this change.   Oncologically, she denies any complaints and ROS questioning is negative.    Past Medical History  Diagnosis Date  . Hypertension   . Diabetes mellitus     years  . Asthma   . Depression   . Mental retardation   . History of recurrent UTIs   . Shortness of breath     with exertion  . Cancer     Rectal   . Arthritis   . Gout   . Seizures     more than 4 yrs since last seizure. UNknown etiology  . Rectal  cancer   . Iron deficiency 04/27/2014  . Chronic renal disease, stage 3, moderately decreased glomerular filtration rate between 30-59 mL/min/1.73 square meter 04/27/2014    has DIABETES MELLITUS, TYPE II, UNCONTROLLED; HYPERLIPIDEMIA; GOUT NOS; Anemia; DEPRESSION; RETARDATION, MENTAL NOS; Essential hypertension; SINUS TACHYCARDIA; ALLERGIC RHINITIS; ASTHMA; ASTHMA, WITH ACUTE EXACERBATION; DISEASE, PANCREAS NOS; INTERTRIGO, CANDIDAL; ARTHRITIS; SEIZURE DISORDER;  UNSPECIFIED SLEEP DISTURBANCE; FLANK PAIN, LEFT; LIVER FUNCTION TESTS, ABNORMAL; MIGRAINES, HX OF; Elevated liver enzymes; Helicobacter pylori gastritis; Rectal cancer; Incidental pulmonary nodule, greater than or equal to 42mm; Abdominal pain; Insomnia; Iron deficiency; and Chronic renal disease, stage 3, moderately decreased glomerular filtration rate between 30-59 mL/min/1.73 square meter on her problem list.     has No Known Allergies.  [name] had no medications administered during this visit.  Past Surgical History  Procedure Laterality Date  . Abdominal hysterectomy    . Multiple extractions with alveoloplasty N/A 11/23/2012    Procedure: MULTIPLE EXTRACION 1, 2, 4, 5, 6, 7, 8, 9, 10, 11, 12, 13, 14, 17, 18, 20, 23, 24, 25, 26, 28, 29, 32 WITH ALVEOLOPLASTY, REMOVE BILATERAL TORI;  Surgeon: Gae Bon, DDS;  Location: [facility];  Service: Oral Surgery;  Laterality: N/A;  . Colonoscopy with esophagogastroduodenoscopy (egd) N/A 01/14/2013    Procedure: COLONOSCOPY WITH ESOPHAGOGASTRODUODENOSCOPY (EGD);  Surgeon: Rogene Houston, MD;  [facility];  Service: Endoscopy;  Laterality: N/A;  250-moved to 315 Ann to notify pt  . Colonoscopy N/A 02/04/2013    Procedure: COLONOSCOPY;  Surgeon: Rogene Houston, MD;  [facility];  Service: Endoscopy;  Laterality: N/A;  225  . Eus N/A 02/18/2013    Procedure: LOWER ENDOSCOPIC ULTRASOUND (EUS);  Surgeon: Milus Banister, MD;  Location: [facility];  Service: Endoscopy;  Laterality: N/A;  . Flexible sigmoidoscopy N/A 07/26/2013    Procedure: FLEXIBLE SIGMOIDOSCOPY;  Surgeon: Jamesetta So, MD;  [facility];  Service: General;  Laterality: N/A;  . Abdominal perineal bowel resection N/A 07/26/2013    Procedure:  ABDOMINAL PERINEAL RESECTION;  Surgeon: Jamesetta So, MD;  [facility];  Service: General;  Laterality: N/A;  . Supracervical abdominal hysterectomy N/A 07/26/2013    Procedure: HYSTERECTOMY SUPRACERVICAL  ABDOMINAL ;  Surgeon: Jamesetta So, MD;  [facility];  Service: General;  Laterality: N/A;  . Salpingoophorectomy Bilateral 07/26/2013    Procedure: SALPINGO OOPHORECTOMY;  Surgeon: Jamesetta So, MD;  [facility];  Service: General;  Laterality: Bilateral;  . Colostomy Left 07/26/2013    Procedure: COLOSTOMY;  Surgeon: Jamesetta So, MD;  [facility];  Service: General;  Laterality: Left;  . Colonoscopy N/A 05/12/2014    Procedure: COLONOSCOPY;  Surgeon: Rogene Houston, MD;  [facility];  Service: Endoscopy;  Laterality: N/A;  1030    Denies any headaches, dizziness, double vision, fevers, chills, night sweats, nausea, vomiting, diarrhea, constipation, chest pain, heart palpitations, shortness of breath, blood in stool, black tarry stool, urinary pain, urinary burning, urinary frequency, hematuria.   PHYSICAL EXAMINATION  ECOG PERFORMANCE STATUS: 0 - Asymptomatic  Filed Vitals:   05/30/14 1039  BP: 182/76  Pulse: 95  Temp: 97 F (36.1 C)  Resp: 18    GENERAL:alert, no distress, well nourished, well developed, comfortable, cooperative, obese, smiling and accompanied by Vickie from [place]. SKIN: skin color, texture, turgor are normal, no rashes or significant lesions HEAD: Normocephalic, No masses, lesions, tenderness or abnormalities EYES: normal, PERRLA, EOMI, Conjunctiva are pink and non-injected  EARS: External ears normal OROPHARYNX:lips, buccal mucosa, and tongue normal and mucous membranes are moist  NECK: supple, no adenopathy, thyroid normal size, non-tender, without nodularity, no stridor, non-tender, trachea midline LYMPH:  no palpable lymphadenopathy BREAST:not examined LUNGS: clear to auscultation  HEART: regular rate & rhythm, no murmurs and no gallops ABDOMEN:abdomen soft, obese and normal bowel sounds BACK: Back symmetric, no curvature., No CVA tenderness EXTREMITIES:less then 2 second capillary refill, no joint deformities, effusion, or  inflammation, no skin discoloration, no clubbing, no cyanosis  NEURO: alert & oriented x 3 with fluent speech, no focal motor/sensory deficits, gait normal   LABORATORY DATA: CBC    Component Value Date/Time   WBC 4.7 04/26/2014 1549   RBC 3.58* 04/26/2014 1549   RBC 3.84* 11/07/2009 1649   HGB 10.4* 04/26/2014 1549   HCT 32.9* 04/26/2014 1549   PLT 199 04/26/2014 1549   MCV 91.9 04/26/2014 1549   MCH 29.1 04/26/2014 1549   MCHC 31.6 04/26/2014 1549   RDW 12.8 04/26/2014 1549   LYMPHSABS 0.6* 04/26/2014 1549   MONOABS 0.3 04/26/2014 1549   EOSABS 0.2 04/26/2014 1549   BASOSABS 0.0 04/26/2014 1549      Chemistry      Component Value Date/Time   NA 136 04/26/2014 1549   K 4.8 04/26/2014 1549   CL 104 04/26/2014 1549   CO2 27 04/26/2014 1549   BUN 36* 04/26/2014 1549   CREATININE 1.39* 04/26/2014 1549   CREATININE 1.11* 02/08/2013 1451      Component Value Date/Time   CALCIUM 8.2* 04/26/2014 1549   ALKPHOS 237* 04/26/2014 1549   AST 18 04/26/2014 1549   ALT 16 04/26/2014 1549   BILITOT 0.4 04/26/2014 1549       RADIOGRAPHIC STUDIES:  Ct Chest Wo Contrast  05/26/2014   CLINICAL DATA:  Lung nodules. Diabetes. Hypertension. History of rectal cancer.  EXAM: CT CHEST WITHOUT CONTRAST  TECHNIQUE: Multidetector CT imaging of the chest was performed following the standard protocol without IV contrast.  COMPARISON:  Multiple exams, including 03/07/2014  FINDINGS: Mediastinum/Nodes: Borderline cardiomegaly. No pathologic adenopathy identified.  Lungs/Pleura: Superior segment left lower lobe pulmonary nodule measures 1.0 by 0.8 cm on image 17 of series 3. Prior measurements are 0.8 by 0.7 cm on 03/07/2014 ; 0.6 by 0.4 cm on 03/05/2013; and not visible on 01/16/2002.  4 mm right lower lobe nodule on image 23 series 3, formerly the same on 03/07/2014, 3 mm on 03/05/2013, and possibly up to 3 mm on 01/16/2002 (image 38, series 7182).  Upper abdomen: Chronic calcification along the  margin of segment one of the liver, image 46 series 2, no change from 03/05/2013, probably incidental.  Musculoskeletal: Mixed sclerosis in the left scapula. Unusually thin cortex and faintly reduced medullary space density in the left eighth rib diffusely. Bridging spurring along all of the thoracic vertebra, with confluent sclerosis in multiple thoracic and lumbar vertebra and some mild bony expansion of the pedicles and facets. There is also some mildly mixed bony density in the mandible.  IMPRESSION: 1. Continued enlargement of the superior segment left lower lobe nodule. Malignancy is likely. 2. In contrast, the right lower lobe nodule is probably benign. 3. Bridging spurring in the thoracic vertebra with scattered considerable skeletal sclerosis in the thorax, suspicious for renal osteodystrophy, sickle cell disease, myelofibrosis, osteosclerotic metastatic disease, mastocytosis, or Paget's disease. The patient also takes phenytoin which can cause bony thickening, particularly in the calvarium.   Electronically Signed   By: Thayer Jew  [name] M.D.   On: 05/26/2014 13:26      ASSESSMENT AND PLAN:  Incidental pulmonary nodule, greater than or equal to 60mm Continued enlargement of the superior segment left lower lobe nodule, concerning for malignancy.  Will repeat Ct of chest wo contrast in 12 weeks to further evaluate lesion.  If growth >1 cm is noted, will set the patient up for a PET scan and refer the patient to CTS for further evaluation and surgical management recommendations.  Return in 12 weeks for follow-up.   Rectal cancer S/P definitive treatment with a curative intent as outlined in the oncology history.  Labs today: CBC diff, CMET, CEA, with repeat labs in 6 and 12 weeks to ascertain serial CEA values.     THERAPY PLAN:  New pulmonary nodule that is enlarging requires work-up +/- biopsy.  Once diagnosed, subsequent treatment planning can take place.  All questions were answered. The  patient knows to call the clinic with any problems, questions or concerns. We can certainly see the patient much sooner if necessary.  Patient and plan discussed with Dr. Ancil Linsey and she is in agreement with the aforementioned.   This note is electronically signed by: Robynn Pane 05/30/2014 11:50 AM

## 2014-05-30 NOTE — Assessment & Plan Note (Addendum)
S/P definitive treatment with a curative intent as outlined in the oncology history.  Labs today: CBC diff, CMET, CEA, with repeat labs in 6 and 12 weeks to ascertain serial CEA values.

## 2014-05-30 NOTE — Assessment & Plan Note (Signed)
BP noted to be 182/76 today.  This is historically stable according to our records.  I will increase Norvasc to 5 mg daily.  A new Rx is provided today.  I will request her primary care provider to follow-up on HTN and adjust further BP medications accordingly.

## 2014-05-30 NOTE — Assessment & Plan Note (Addendum)
Continued enlargement of the superior segment left lower lobe nodule, concerning for malignancy.  Will repeat Ct of chest wo contrast in 12 weeks to further evaluate lesion.  If growth >1 cm is noted, will set the patient up for a PET scan and refer the patient to CTS for further evaluation and surgical management recommendations.  Return in 12 weeks for follow-up.

## 2014-05-31 LAB — CEA: CEA: 4.2 ng/mL (ref 0.0–4.7)

## 2014-05-31 LAB — FERRITIN: Ferritin: 214 ng/mL (ref 10–291)

## 2014-06-30 ENCOUNTER — Other Ambulatory Visit (INDEPENDENT_AMBULATORY_CARE_PROVIDER_SITE_OTHER): Payer: Self-pay | Admitting: Internal Medicine

## 2014-06-30 ENCOUNTER — Ambulatory Visit (HOSPITAL_COMMUNITY): Payer: Medicaid Other | Admitting: Oncology

## 2014-06-30 ENCOUNTER — Other Ambulatory Visit (HOSPITAL_COMMUNITY): Payer: PRIVATE HEALTH INSURANCE

## 2014-07-11 ENCOUNTER — Encounter (HOSPITAL_COMMUNITY): Payer: Medicare Other | Attending: Hematology and Oncology

## 2014-07-11 DIAGNOSIS — D649 Anemia, unspecified: Secondary | ICD-10-CM | POA: Diagnosis present

## 2014-07-11 DIAGNOSIS — C20 Malignant neoplasm of rectum: Secondary | ICD-10-CM | POA: Diagnosis not present

## 2014-07-11 LAB — CBC WITH DIFFERENTIAL/PLATELET
BASOS PCT: 0 % (ref 0–1)
Basophils Absolute: 0 10*3/uL (ref 0.0–0.1)
Eosinophils Absolute: 0.2 10*3/uL (ref 0.0–0.7)
Eosinophils Relative: 4 % (ref 0–5)
HEMATOCRIT: 32.8 % — AB (ref 36.0–46.0)
Hemoglobin: 10.4 g/dL — ABNORMAL LOW (ref 12.0–15.0)
LYMPHS ABS: 0.6 10*3/uL — AB (ref 0.7–4.0)
Lymphocytes Relative: 13 % (ref 12–46)
MCH: 28.5 pg (ref 26.0–34.0)
MCHC: 31.7 g/dL (ref 30.0–36.0)
MCV: 89.9 fL (ref 78.0–100.0)
MONOS PCT: 7 % (ref 3–12)
Monocytes Absolute: 0.3 10*3/uL (ref 0.1–1.0)
Neutro Abs: 3.8 10*3/uL (ref 1.7–7.7)
Neutrophils Relative %: 76 % (ref 43–77)
Platelets: 190 10*3/uL (ref 150–400)
RBC: 3.65 MIL/uL — ABNORMAL LOW (ref 3.87–5.11)
RDW: 13.1 % (ref 11.5–15.5)
WBC: 4.9 10*3/uL (ref 4.0–10.5)

## 2014-07-11 LAB — COMPREHENSIVE METABOLIC PANEL
ALK PHOS: 286 U/L — AB (ref 39–117)
ALT: 19 U/L (ref 0–35)
AST: 19 U/L (ref 0–37)
Albumin: 3.8 g/dL (ref 3.5–5.2)
Anion gap: 8 (ref 5–15)
BUN: 39 mg/dL — AB (ref 6–23)
CHLORIDE: 100 mmol/L (ref 96–112)
CO2: 28 mmol/L (ref 19–32)
Calcium: 8.4 mg/dL (ref 8.4–10.5)
Creatinine, Ser: 1.1 mg/dL (ref 0.50–1.10)
GFR calc Af Amer: 61 mL/min — ABNORMAL LOW (ref >90)
GFR calc non Af Amer: 52 mL/min — ABNORMAL LOW (ref >90)
GLUCOSE: 68 mg/dL — AB (ref 70–99)
Potassium: 4.9 mmol/L (ref 3.5–5.1)
Sodium: 136 mmol/L (ref 135–145)
Total Bilirubin: 0.4 mg/dL (ref 0.3–1.2)
Total Protein: 7.2 g/dL (ref 6.0–8.3)

## 2014-07-12 LAB — CEA: CEA: 4.3 ng/mL (ref 0.0–4.7)

## 2014-08-17 ENCOUNTER — Ambulatory Visit (HOSPITAL_COMMUNITY)
Admission: RE | Admit: 2014-08-17 | Discharge: 2014-08-17 | Disposition: A | Discharge status: Home / Self Care | Payer: Medicare Other | Source: Ambulatory Visit | Attending: Oncology | Admitting: Oncology

## 2014-08-17 DIAGNOSIS — Z85048 Personal history of other malignant neoplasm of rectum, rectosigmoid junction, and anus: Secondary | ICD-10-CM | POA: Diagnosis not present

## 2014-08-17 DIAGNOSIS — R911 Solitary pulmonary nodule: Secondary | ICD-10-CM | POA: Diagnosis not present

## 2014-08-22 ENCOUNTER — Emergency Department (HOSPITAL_COMMUNITY): Payer: Medicare Other

## 2014-08-22 ENCOUNTER — Ambulatory Visit (HOSPITAL_COMMUNITY): Payer: Medicare Other | Admitting: Oncology

## 2014-08-22 ENCOUNTER — Encounter (HOSPITAL_COMMUNITY): Payer: Self-pay | Admitting: Emergency Medicine

## 2014-08-22 ENCOUNTER — Encounter (HOSPITAL_COMMUNITY): Payer: Medicare Other

## 2014-08-22 ENCOUNTER — Emergency Department (HOSPITAL_COMMUNITY)
Admission: EM | Admit: 2014-08-22 | Discharge: 2014-08-22 | Disposition: A | Discharge status: Home / Self Care | Payer: Medicare Other | Attending: Emergency Medicine | Admitting: Emergency Medicine

## 2014-08-22 DIAGNOSIS — S4991XA Unspecified injury of right shoulder and upper arm, initial encounter: Secondary | ICD-10-CM | POA: Diagnosis not present

## 2014-08-22 DIAGNOSIS — M1990 Unspecified osteoarthritis, unspecified site: Secondary | ICD-10-CM | POA: Insufficient documentation

## 2014-08-22 DIAGNOSIS — Y9389 Activity, other specified: Secondary | ICD-10-CM | POA: Diagnosis not present

## 2014-08-22 DIAGNOSIS — S4992XA Unspecified injury of left shoulder and upper arm, initial encounter: Secondary | ICD-10-CM | POA: Insufficient documentation

## 2014-08-22 DIAGNOSIS — Y92121 Bathroom in nursing home as the place of occurrence of the external cause: Secondary | ICD-10-CM | POA: Insufficient documentation

## 2014-08-22 DIAGNOSIS — E611 Iron deficiency: Secondary | ICD-10-CM | POA: Diagnosis not present

## 2014-08-22 DIAGNOSIS — Y998 Other external cause status: Secondary | ICD-10-CM | POA: Insufficient documentation

## 2014-08-22 DIAGNOSIS — Z7982 Long term (current) use of aspirin: Secondary | ICD-10-CM | POA: Diagnosis not present

## 2014-08-22 DIAGNOSIS — Z85048 Personal history of other malignant neoplasm of rectum, rectosigmoid junction, and anus: Secondary | ICD-10-CM | POA: Diagnosis not present

## 2014-08-22 DIAGNOSIS — Z792 Long term (current) use of antibiotics: Secondary | ICD-10-CM | POA: Insufficient documentation

## 2014-08-22 DIAGNOSIS — Z794 Long term (current) use of insulin: Secondary | ICD-10-CM | POA: Diagnosis not present

## 2014-08-22 DIAGNOSIS — N183 Chronic kidney disease, stage 3 (moderate): Secondary | ICD-10-CM | POA: Insufficient documentation

## 2014-08-22 DIAGNOSIS — F79 Unspecified intellectual disabilities: Secondary | ICD-10-CM | POA: Insufficient documentation

## 2014-08-22 DIAGNOSIS — I129 Hypertensive chronic kidney disease with stage 1 through stage 4 chronic kidney disease, or unspecified chronic kidney disease: Secondary | ICD-10-CM | POA: Insufficient documentation

## 2014-08-22 DIAGNOSIS — J45909 Unspecified asthma, uncomplicated: Secondary | ICD-10-CM | POA: Diagnosis not present

## 2014-08-22 DIAGNOSIS — M109 Gout, unspecified: Secondary | ICD-10-CM | POA: Insufficient documentation

## 2014-08-22 DIAGNOSIS — Z87891 Personal history of nicotine dependence: Secondary | ICD-10-CM | POA: Diagnosis not present

## 2014-08-22 DIAGNOSIS — E162 Hypoglycemia, unspecified: Secondary | ICD-10-CM

## 2014-08-22 DIAGNOSIS — Z79899 Other long term (current) drug therapy: Secondary | ICD-10-CM | POA: Diagnosis not present

## 2014-08-22 DIAGNOSIS — G40909 Epilepsy, unspecified, not intractable, without status epilepticus: Secondary | ICD-10-CM | POA: Diagnosis not present

## 2014-08-22 DIAGNOSIS — Z87440 Personal history of urinary (tract) infections: Secondary | ICD-10-CM | POA: Insufficient documentation

## 2014-08-22 DIAGNOSIS — W1839XA Other fall on same level, initial encounter: Secondary | ICD-10-CM | POA: Diagnosis not present

## 2014-08-22 DIAGNOSIS — E11649 Type 2 diabetes mellitus with hypoglycemia without coma: Secondary | ICD-10-CM | POA: Insufficient documentation

## 2014-08-22 DIAGNOSIS — Z7952 Long term (current) use of systemic steroids: Secondary | ICD-10-CM | POA: Insufficient documentation

## 2014-08-22 DIAGNOSIS — F329 Major depressive disorder, single episode, unspecified: Secondary | ICD-10-CM | POA: Insufficient documentation

## 2014-08-22 DIAGNOSIS — W19XXXA Unspecified fall, initial encounter: Secondary | ICD-10-CM

## 2014-08-22 LAB — PHENYTOIN LEVEL, TOTAL: Phenytoin Lvl: 6.7 ug/mL — ABNORMAL LOW (ref 10.0–20.0)

## 2014-08-22 LAB — CBG MONITORING, ED
GLUCOSE-CAPILLARY: 42 mg/dL — AB (ref 65–99)
Glucose-Capillary: 78 mg/dL (ref 65–99)

## 2014-08-22 NOTE — ED Notes (Signed)
Pt given crackers and graham crackers

## 2014-08-22 NOTE — ED Notes (Signed)
Pt made aware to return if symptoms worsen or if any life threatening symptoms occur.   

## 2014-08-22 NOTE — ED Notes (Signed)
Notified of low CBG. Patient alert and oriented to baseline. Orange juice and meal tray provided. MD aware.

## 2014-08-22 NOTE — Discharge Instructions (Signed)
Tylenol for pain - your xrays are normal

## 2014-08-22 NOTE — ED Provider Notes (Signed)
CSN: [id number]     Arrival date & time 08/22/14  1204 History   First MD Initiated Contact with Patient 08/22/14 1257     Chief Complaint  Patient presents with  . Fall     (Consider location/radiation/quality/duration/timing/severity/associated sxs/prior Treatment) HPI Comments: 64 year old female a very pleasant, has a history of hypertension, diabetes, seizure disorder, who also has some mental retardation. She lives in a adult care facility, according to the patient this morning as she was coming out of the bathroom she lost her balance falling forward onto her hands, caught herself, did not injure her head or neck or chest abdomen pelvis or her back. She has no lower extremity pain but does complain of pain in her right hand and her left shoulder. The patient is unable to give any further information, she denies palpitations or fevers, the caregiver who is here with the patient who is a Education officer, museum states that she is at her baseline with mental status. The patient does take Dilantin for seizures  Patient is a 64 y.o. female presenting with fall. The history is provided by the patient.  Fall    Past Medical History  Diagnosis Date  . Hypertension   . Diabetes mellitus     years  . Asthma   . Depression   . Mental retardation   . History of recurrent UTIs   . Shortness of breath     with exertion  . Cancer     Rectal   . Arthritis   . Gout   . Seizures     more than 4 yrs since last seizure. UNknown etiology  . Rectal cancer   . Iron deficiency 04/27/2014  . Chronic renal disease, stage 3, moderately decreased glomerular filtration rate between 30-59 mL/min/1.73 square meter 04/27/2014   Past Surgical History  Procedure Laterality Date  . Abdominal hysterectomy    . Multiple extractions with alveoloplasty N/A 11/23/2012    Procedure: MULTIPLE EXTRACION 1, 2, 4, 5, 6, 7, 8, 9, 10, 11, 12, 13, 14, 17, 18, 20, 23, 24, 25, 26, 28, 29, 32 WITH ALVEOLOPLASTY, REMOVE BILATERAL  TORI;  Surgeon: Gae Bon, DDS;  Location: [facility];  Service: Oral Surgery;  Laterality: N/A;  . Colonoscopy with esophagogastroduodenoscopy (egd) N/A 01/14/2013    Procedure: COLONOSCOPY WITH ESOPHAGOGASTRODUODENOSCOPY (EGD);  Surgeon: Rogene Houston, MD;  [facility];  Service: Endoscopy;  Laterality: N/A;  250-moved to 315 Ann to notify pt  . Colonoscopy N/A 02/04/2013    Procedure: COLONOSCOPY;  Surgeon: Rogene Houston, MD;  [facility];  Service: Endoscopy;  Laterality: N/A;  225  . Eus N/A 02/18/2013    Procedure: LOWER ENDOSCOPIC ULTRASOUND (EUS);  Surgeon: Milus Banister, MD;  Location: [facility];  Service: Endoscopy;  Laterality: N/A;  . Flexible sigmoidoscopy N/A 07/26/2013    Procedure: FLEXIBLE SIGMOIDOSCOPY;  Surgeon: Jamesetta So, MD;  [facility];  Service: General;  Laterality: N/A;  . Abdominal perineal bowel resection N/A 07/26/2013    Procedure:  ABDOMINAL PERINEAL RESECTION;  Surgeon: Jamesetta So, MD;  Location[facility];  Service: General;  Laterality: N/A;  . Supracervical abdominal hysterectomy N/A 07/26/2013    Procedure: HYSTERECTOMY SUPRACERVICAL ABDOMINAL ;  Surgeon: Jamesetta So, MD;  [facility];  Service: General;  Laterality: N/A;  . Salpingoophorectomy Bilateral 07/26/2013    Procedure: SALPINGO OOPHORECTOMY;  Surgeon: Jamesetta So, MD;  [facility];  Service: General;  Laterality: Bilateral;  .  Colostomy Left 07/26/2013    Procedure: COLOSTOMY;  Surgeon: Jamesetta So, MD;  [facility];  Service: General;  Laterality: Left;  . Colonoscopy N/A 05/12/2014    Procedure: COLONOSCOPY;  Surgeon: Rogene Houston, MD;  [facility];  Service: Endoscopy;  Laterality: N/A;  1030   History reviewed. No pertinent family history. History  Substance Use Topics  . Smoking status: Former Smoker    Types: Cigarettes    Quit date: 07/16/2008  . Smokeless tobacco: Never Used     Comment: unknown when stopped  smoking      long time  . Alcohol Use: No   OB History    No data available     Review of Systems  Unable to perform ROS: Other      Allergies  Review of patient's allergies indicates no known allergies.  Home Medications   Prior to Admission medications   Medication Sig Start Date End Date Taking? Authorizing Provider  amLODipine (NORVASC) 5 MG tablet Take 1 tablet (5 mg total) by mouth daily. 05/30/14  Yes Baird Cancer, PA-C  aspirin EC 81 MG tablet Take 81 mg by mouth daily.   Yes Historical Provider, MD  diphenoxylate-atropine (LOMOTIL) 2.5-0.025 MG per tablet Take 1 tablet by mouth every 2 (two) hours as needed for diarrhea or loose stools. 01/24/14  Yes Manon Hilding Kefalas, PA-C  docusate sodium (COLACE) 100 MG capsule Take 100 mg by mouth 2 (two) times daily.   Yes Historical Provider, MD  Emollient (CERAVE) LOTN Apply 1 application topically daily. Apply to feet   Yes Historical Provider, MD  ferrous sulfate 325 (65 FE) MG tablet TAKE (1) TABLET BY MOUTH TWICE DAILY. 07/01/14  Yes Rogene Houston, MD  fish oil-omega-3 fatty acids 1000 MG capsule Take 1 g by mouth 2 (two) times daily.   Yes Historical Provider, MD  Fluticasone-Salmeterol (ADVAIR) 250-50 MCG/DOSE AEPB Inhale 1 puff into the lungs every 12 (twelve) hours.   Yes Historical Provider, MD  ibuprofen (ADVIL,MOTRIN) 600 MG tablet Take 600 mg by mouth every 6 (six) hours as needed for moderate pain.  07/19/13  Yes Historical Provider, MD  Insulin Glargine (LANTUS SOLOSTAR) 100 UNIT/ML SOPN Inject 15 Units into the skin at bedtime.    Yes Historical Provider, MD  loratadine-pseudoephedrine (LORATADINE-D 24HR) 10-240 MG per 24 hr tablet Take 1 tablet by mouth daily.   Yes Historical Provider, MD  metFORMIN (GLUCOPHAGE) 1000 MG tablet Take 1,000 mg by mouth 2 (two) times daily with a meal.   Yes Historical Provider, MD  metoprolol succinate (TOPROL-XL) 50 MG 24 hr tablet Take 100 mg by mouth at bedtime. Take with or  immediately following a meal.   Yes Historical Provider, MD  montelukast (SINGULAIR) 10 MG tablet Take 10 mg by mouth at bedtime.   Yes Historical Provider, MD  pantoprazole (PROTONIX) 40 MG tablet Take 40 mg by mouth daily.   Yes Historical Provider, MD  phenytoin (DILANTIN) 100 MG ER capsule Take 100 mg by mouth 2 (two) times daily.   Yes Historical Provider, MD  pravastatin (PRAVACHOL) 40 MG tablet Take 40 mg by mouth daily.   Yes Historical Provider, MD  silver sulfADIAZINE (SILVADENE) 1 % cream Apply 1 application topically daily.   Yes Historical Provider, MD  sodium chloride (OCEAN) 0.65 % SOLN nasal spray Place 2 sprays into both nostrils 4 (four) times daily as needed for congestion.   Yes Historical Provider, MD  traMADol Veatrice Bourbon) 50  MG tablet Take by mouth every 6 (six) hours as needed.   Yes Historical Provider, MD  triamcinolone cream (KENALOG) 0.1 % Apply 1 application topically daily. To legs   Yes Historical Provider, MD   BP 146/69 mmHg  Pulse 89  Temp(Src) 97.7 F (36.5 C) (Oral)  Resp 18  Ht '5\' 4"'$  (1.626 m)  Wt 250 lb (113.399 kg)  BMI 42.89 kg/m2  SpO2 100% Physical Exam  Constitutional: She appears well-developed and well-nourished. No distress.  HENT:  Head: Normocephalic and atraumatic.  Mouth/Throat: Oropharynx is clear and moist. No oropharyngeal exudate.  Eyes: Conjunctivae and EOM are normal. Pupils are equal, round, and reactive to light. Right eye exhibits no discharge. Left eye exhibits no discharge. No scleral icterus.  Neck: Normal range of motion. Neck supple. No JVD present. No thyromegaly present.  Cardiovascular: Normal rate, regular rhythm, normal heart sounds and intact distal pulses.  Exam reveals no gallop and no friction rub.   No murmur heard. Pulmonary/Chest: Effort normal and breath sounds normal. No respiratory distress. She has no wheezes. She has no rales.  Abdominal: Soft. Bowel sounds are normal. She exhibits no distension and no mass.  There is no tenderness.  Musculoskeletal: Normal range of motion. She exhibits tenderness ( Mild tenderness over the thenar eminence on the right and the left shoulder, decreased range of motion of the left shoulder, no obvious swelling bruising or deformities). She exhibits no edema.  Except as mentioned below all compartments are soft and joints are supple  Lymphadenopathy:    She has no cervical adenopathy.  Neurological: She is alert. Coordination normal.  Cranial nerves III through XII intact, speech is clear, follows commands without difficulty, no limb ataxia, no truncal ataxia  Skin: Skin is warm and dry. No rash noted. She is not diaphoretic. No erythema.  Psychiatric: She has a normal mood and affect. Her behavior is normal.  Nursing note and vitals reviewed.   ED Course  Procedures (including critical care time) Labs Review Labs Reviewed  PHENYTOIN LEVEL, TOTAL - Abnormal; Notable for the following:    Phenytoin Lvl 6.7 (*)    All other components within normal limits  CBG MONITORING, ED - Abnormal; Notable for the following:    Glucose-Capillary 42 (*)    All other components within normal limits  CBG MONITORING, ED    Imaging Review Dg Shoulder Left  08/22/2014   CLINICAL DATA:  Left shoulder pain after fall going to the bathroom. Initial encounter.  EXAM: LEFT SHOULDER - 2+ VIEW  COMPARISON:  None.  FINDINGS: There is no evidence of acute fracture or dislocation.  Subacromial narrowing from acromial spurring and humeral head migration. There is glenohumeral marginal spurring without joint narrowing. No evidence of left chest wall trauma.  IMPRESSION: No acute osseous findings.   Electronically Signed   By: Monte Fantasia M.D.   On: 08/22/2014 13:59   Dg Hand Complete Right  08/22/2014   CLINICAL DATA:  Fall with right hand pain.  Initial encounter.  EXAM: RIGHT HAND - COMPLETE 3+ VIEW  COMPARISON:  09/18/2009  FINDINGS: No evidence of acute fracture or dislocation.  There  is chronic widening of the scapholunate interval consistent with ligamentous insufficiency.  Prominent first CMC osteoarthritis with subluxation and spurring.  Chondrocalcinosis noted at the second MCP joint.  IMPRESSION: 1. No acute findings. 2.  Chronic scapholunate ligament insufficiency.   Electronically Signed   By: Monte Fantasia M.D.   On: 08/22/2014 14:01  MDM   Final diagnoses:  Fall, initial encounter  Hypoglycemia    Minimal signs of trauma, imaged to rule out fractures, Dilantin level to rule out Dilantin toxicity and possible ataxia, patient maintained ambulate prior to discharge.  Hypoglycemia, patient ambulated without difficulty while hypoglycemic, normal baseline mental status, was able to tolerate food without difficulty, blood sugar returned to a normal level and she is now stable for discharge. Imaging shows no fractures, Dilantin level slightly subtherapeutic, stable for discharge in the care of her caregiver.  Noemi Chapel, MD 08/22/14 [phone]

## 2014-08-22 NOTE — ED Notes (Signed)
Fall coming out of bathroom.  Pt says she lost her balance.  Denies dizziness.

## 2014-08-22 NOTE — ED Notes (Signed)
Meal given to pt.

## 2014-09-07 ENCOUNTER — Encounter (HOSPITAL_COMMUNITY): Payer: Medicare Other

## 2014-09-07 ENCOUNTER — Ambulatory Visit (HOSPITAL_COMMUNITY): Payer: Medicare Other | Admitting: Oncology

## 2014-09-12 ENCOUNTER — Other Ambulatory Visit: Payer: Self-pay

## 2014-09-12 ENCOUNTER — Encounter (HOSPITAL_COMMUNITY): Payer: Medicare Other

## 2014-09-15 ENCOUNTER — Encounter (HOSPITAL_BASED_OUTPATIENT_CLINIC_OR_DEPARTMENT_OTHER): Payer: Medicare Other

## 2014-09-15 ENCOUNTER — Encounter (HOSPITAL_COMMUNITY): Payer: Medicare Other | Attending: Hematology and Oncology | Admitting: Oncology

## 2014-09-15 ENCOUNTER — Encounter (HOSPITAL_COMMUNITY): Payer: Self-pay | Admitting: Oncology

## 2014-09-15 VITALS — BP 184/79 | HR 88 | Temp 98.9°F | Resp 18 | Wt 222.4 lb

## 2014-09-15 DIAGNOSIS — R911 Solitary pulmonary nodule: Secondary | ICD-10-CM

## 2014-09-15 DIAGNOSIS — C20 Malignant neoplasm of rectum: Secondary | ICD-10-CM

## 2014-09-15 DIAGNOSIS — D649 Anemia, unspecified: Secondary | ICD-10-CM | POA: Diagnosis present

## 2014-09-15 LAB — COMPREHENSIVE METABOLIC PANEL
ALT: 17 U/L (ref 14–54)
AST: 18 U/L (ref 15–41)
Albumin: 3.7 g/dL (ref 3.5–5.0)
Alkaline Phosphatase: 271 U/L — ABNORMAL HIGH (ref 38–126)
Anion gap: 9 (ref 5–15)
BUN: 27 mg/dL — ABNORMAL HIGH (ref 6–20)
CHLORIDE: 100 mmol/L — AB (ref 101–111)
CO2: 29 mmol/L (ref 22–32)
Calcium: 8.4 mg/dL — ABNORMAL LOW (ref 8.9–10.3)
Creatinine, Ser: 1.02 mg/dL — ABNORMAL HIGH (ref 0.44–1.00)
GFR calc Af Amer: >60 mL/min (ref >60)
GFR, EST NON AFRICAN AMERICAN: 57 mL/min — AB (ref >60)
Glucose, Bld: 143 mg/dL — ABNORMAL HIGH (ref 65–99)
Potassium: 4.6 mmol/L (ref 3.5–5.1)
Sodium: 138 mmol/L (ref 135–145)
TOTAL PROTEIN: 7.2 g/dL (ref 6.5–8.1)
Total Bilirubin: 0.3 mg/dL (ref 0.3–1.2)

## 2014-09-15 LAB — CBC WITH DIFFERENTIAL/PLATELET
BASOS ABS: 0 10*3/uL (ref 0.0–0.1)
Basophils Relative: 0 % (ref 0–1)
Eosinophils Absolute: 0.3 10*3/uL (ref 0.0–0.7)
Eosinophils Relative: 6 % — ABNORMAL HIGH (ref 0–5)
HCT: 33.2 % — ABNORMAL LOW (ref 36.0–46.0)
HEMOGLOBIN: 10.5 g/dL — AB (ref 12.0–15.0)
LYMPHS ABS: 0.6 10*3/uL — AB (ref 0.7–4.0)
LYMPHS PCT: 12 % (ref 12–46)
MCH: 27.9 pg (ref 26.0–34.0)
MCHC: 31.6 g/dL (ref 30.0–36.0)
MCV: 88.3 fL (ref 78.0–100.0)
MONO ABS: 0.4 10*3/uL (ref 0.1–1.0)
Monocytes Relative: 8 % (ref 3–12)
NEUTROS ABS: 3.9 10*3/uL (ref 1.7–7.7)
NEUTROS PCT: 74 % (ref 43–77)
PLATELETS: 189 10*3/uL (ref 150–400)
RBC: 3.76 MIL/uL — ABNORMAL LOW (ref 3.87–5.11)
RDW: 12.8 % (ref 11.5–15.5)
WBC: 5.3 10*3/uL (ref 4.0–10.5)

## 2014-09-15 NOTE — Assessment & Plan Note (Signed)
S/P definitive treatment with a curative intent as outlined in the oncology history.    Labs today: CBC diff, CMET, CEA, with repeat labs in 12 weeks.

## 2014-09-15 NOTE — Progress Notes (Signed)
Jennifer Bogus, MD [place] [address]  Incidental pulmonary nodule, greater than or equal to 84m - Plan: NM PET Image Initial (PI) Skull Base To Thigh  Rectal cancer - Plan: CBC with Differential, Comprehensive metabolic panel, CEA  CURRENT THERAPY: Surveillance per NCCN guidelines and close surveillance of a slowly enlarging pulmonary nodule.  INTERVAL HISTORY: Jennifer Solorzano89y.o. female returns for followup of Locally advanced adenocarcinoma of the rectum, tolerating combined modality therapy well. Radiation therapy completed on 05/04/2013 which was also the last day she received Xeloda, excellent tolerance, status post APR on 07/26/2013 with marked cytoreduction documented as evidenced by no lymph node involvement. pT3 tumor was found. S/P 6 months of Xeloda at 1000 mg BID 2 weeks on and 1 week off finishing in December 2015.     Rectal cancer   02/18/2013 Initial Diagnosis Rectal cancer   03/25/2013 - 05/05/2013 Radiation Therapy Pelvis treatment from 1/8- 2/12 with rectal boost from 2/13- 05/05/2013.   03/25/2013 - 05/05/2013 Chemotherapy Xeloda 1500 mg BID 5 days/week with radiation therapy.   07/26/2013 Definitive Surgery Dr. JArnoldo Morale Flexible sigmoidoscopy, abdominoperineal resection, total abdominal hysterectomy with bilateral salpingo-oophorectomy   08/30/2013 - 03/01/2014 Chemotherapy Xeloda 1000 mg BID 14 days on and 7 days off x 6 months   03/08/2014 Imaging CT CAP- Bilateral pulmonary nodules, including an 8 mm left lower lobe nodule. Metastatic disease is a concern. The left lower lobe nodule has progressed since 03/05/2013, when it measured 5 mm.   05/26/2014 Imaging CT Chest- Continued enlargement of the superior segment left lower lobe nodule. Malignancy is likely.   In contrast, the right lower lobe nodule is probably benign.   08/17/2014 Imaging CT- Superior segment left lower lobe pulmonary nodule measures stable since the most recent  comparison study, but is again noted to have increased in size when comparing to older studies. Continued close attention will be required as neoplasm remains a con    Patient is here to follow-up on CT of chest imaging that was performed recently. I personally reviewed and went over radiographic studies with the patient.  The results are noted within this dictation.  CT of chest is concerning for: Superior segment left lower lobe pulmonary nodule measures stable since the most recent comparison study, but is again noted to have increased in size when comparing to older studies. Continued close attention will be required as neoplasm remains a concern.  I personally reviewed and went over laboratory results with the patient.  The results are noted within this dictation.  We will update these today.  Given the small nature of the 10 x 9 mm pulmonary nodule, it is reasonable to follow with repeat CT imaging in 12 weeks.  Once it reaches the threshold of 1 cm, I will set the patient up for a PET scan and likely a referral to CTS for biopsy.    The patient and her DSS (Vickie) representative are agreeable to this plan.       Past Medical History  Diagnosis Date  . Hypertension   . Diabetes mellitus     years  . Asthma   . Depression   . Mental retardation   . History of recurrent UTIs   . Shortness of breath     with exertion  . Cancer     Rectal   . Arthritis   . Gout   . Seizures     more than  4 yrs since last seizure. UNknown etiology  . Rectal cancer   . Iron deficiency 04/27/2014  . Chronic renal disease, stage 3, moderately decreased glomerular filtration rate between 30-59 mL/min/1.73 square meter 04/27/2014    has DIABETES MELLITUS, TYPE II, UNCONTROLLED; HYPERLIPIDEMIA; GOUT NOS; Anemia; DEPRESSION; RETARDATION, MENTAL NOS; Essential hypertension; SINUS TACHYCARDIA; ALLERGIC RHINITIS; ASTHMA; ASTHMA, WITH ACUTE EXACERBATION; DISEASE, PANCREAS NOS; INTERTRIGO, CANDIDAL;  ARTHRITIS; SEIZURE DISORDER; UNSPECIFIED SLEEP DISTURBANCE; FLANK PAIN, LEFT; LIVER FUNCTION TESTS, ABNORMAL; MIGRAINES, HX OF; Elevated liver enzymes; Helicobacter pylori gastritis; Rectal cancer; Incidental pulmonary nodule, greater than or equal to 40m; Abdominal pain; Insomnia; Iron deficiency; and Chronic renal disease, stage 3, moderately decreased glomerular filtration rate between 30-59 mL/min/1.73 square meter on her problem list.     has No Known Allergies.  Ms. TGunnersondoes not currently have medications on file.  Past Surgical History  Procedure Laterality Date  . Abdominal hysterectomy    . Multiple extractions with alveoloplasty N/A 11/23/2012    Procedure: MULTIPLE EXTRACION 1, 2, 4, 5, 6, 7, 8, 9, 10, 11, 12, 13, 14, 17, 18, 20, 23, 24, 25, 26, 28, 29, 32 WITH ALVEOLOPLASTY, REMOVE BILATERAL TORI;  Surgeon: SGae Bon DDS;  [facility]  Service: Oral Surgery;  Laterality: N/A;  . Colonoscopy with esophagogastroduodenoscopy (egd) N/A 01/14/2013    Procedure: COLONOSCOPY WITH ESOPHAGOGASTRODUODENOSCOPY (EGD);  Surgeon: NRogene Houston MD;  [facility];  Service: Endoscopy;  Laterality: N/A;  250-moved to 315 Ann to notify pt  . Colonoscopy N/A 02/04/2013    Procedure: COLONOSCOPY;  Surgeon: NRogene Houston MD;  [facility];  Service: Endoscopy;  Laterality: N/A;  225  . Eus N/A 02/18/2013    Procedure: LOWER ENDOSCOPIC ULTRASOUND (EUS);  Surgeon: DMilus Banister MD;  [facility];  Service: Endoscopy;  Laterality: N/A;  . Flexible sigmoidoscopy N/A 07/26/2013    Procedure: FLEXIBLE SIGMOIDOSCOPY;  Surgeon: MJamesetta So MD;  [facility];  Service: General;  Laterality: N/A;  . Abdominal perineal bowel resection N/A 07/26/2013    Procedure:  ABDOMINAL PERINEAL RESECTION;  Surgeon: MJamesetta So MD;  [facility];  Service: General;  Laterality: N/A;  . Supracervical abdominal hysterectomy N/A 07/26/2013    Procedure:  HYSTERECTOMY SUPRACERVICAL ABDOMINAL ;  Surgeon: MJamesetta So MD;  Location[facility];  Service: General;  Laterality: N/A;  . Salpingoophorectomy Bilateral 07/26/2013    Procedure: SALPINGO OOPHORECTOMY;  Surgeon: MJamesetta So MD;  [facility];  Service: General;  Laterality: Bilateral;  . Colostomy Left 07/26/2013    Procedure: COLOSTOMY;  Surgeon: MJamesetta So MD;  [facility];  Service: General;  Laterality: Left;  . Colonoscopy N/A 05/12/2014    Procedure: COLONOSCOPY;  Surgeon: NRogene Houston MD;  [facility];  Service: Endoscopy;  Laterality: N/A;  1030    Denies any headaches, dizziness, double vision, fevers, chills, night sweats, nausea, vomiting, diarrhea, constipation, chest pain, heart palpitations, shortness of breath, blood in stool, black tarry stool, urinary pain, urinary burning, urinary frequency, hematuria.   PHYSICAL EXAMINATION  ECOG PERFORMANCE STATUS: 1  Filed Vitals:   09/15/14 1339  BP: 190/70  Pulse: 88  Temp: 98.9 F (37.2 C)  Resp: 18    GENERAL:alert, no distress, well nourished, well developed, comfortable, cooperative, obese, smiling and [place] SKIN: skin color, texture, turgor are normal, no rashes or significant lesions HEAD: Normocephalic, No masses, lesions, tenderness or abnormalities EYES:  normal, PERRLA, EOMI, Conjunctiva are pink and non-injected EARS: External ears normal OROPHARYNX:lips, buccal mucosa, and tongue normal and mucous membranes are moist  NECK: supple, no adenopathy, thyroid normal size, non-tender, without nodularity, no stridor, non-tender, trachea midline LYMPH:  no palpable lymphadenopathy BREAST:not examined LUNGS: clear to auscultation  HEART: regular rate & rhythm, no murmurs and no gallops ABDOMEN:abdomen soft, obese and normal bowel sounds BACK: Back symmetric, no curvature., No CVA tenderness EXTREMITIES:less then 2 second capillary refill, no joint deformities,  effusion, or inflammation, no skin discoloration, no clubbing, no cyanosis  NEURO: alert & oriented x 3 with fluent speech, no focal motor/sensory deficits, gait normal   LABORATORY DATA: CBC    Component Value Date/Time   WBC 5.3 09/15/2014 1327   RBC 3.76* 09/15/2014 1327   RBC 3.84* 11/07/2009 1649   HGB 10.5* 09/15/2014 1327   HCT 33.2* 09/15/2014 1327   PLT 189 09/15/2014 1327   MCV 88.3 09/15/2014 1327   MCH 27.9 09/15/2014 1327   MCHC 31.6 09/15/2014 1327   RDW 12.8 09/15/2014 1327   LYMPHSABS 0.6* 09/15/2014 1327   MONOABS 0.4 09/15/2014 1327   EOSABS 0.3 09/15/2014 1327   BASOSABS 0.0 09/15/2014 1327      Chemistry      Component Value Date/Time   NA 136 07/11/2014 1015   K 4.9 07/11/2014 1015   CL 100 07/11/2014 1015   CO2 28 07/11/2014 1015   BUN 39* 07/11/2014 1015   CREATININE 1.10 07/11/2014 1015   CREATININE 1.11* 02/08/2013 1451      Component Value Date/Time   CALCIUM 8.4 07/11/2014 1015   ALKPHOS 286* 07/11/2014 1015   AST 19 07/11/2014 1015   ALT 19 07/11/2014 1015   BILITOT 0.4 07/11/2014 1015       RADIOGRAPHIC STUDIES:  Ct Chest Wo Contrast  08/17/2014   CLINICAL DATA:  Subsequent encounter for pulmonary nodule in a patient with a personal history of rectal cancer.  EXAM: CT CHEST WITHOUT CONTRAST  TECHNIQUE: Multidetector CT imaging of the chest was performed following the standard protocol without IV contrast.  COMPARISON:  05/26/2014.  03/07/2014.  FINDINGS: Mediastinum / Lymph Nodes: There is no axillary lymphadenopathy. No mediastinal or hilar lymphadenopathy. Heart size is normal. No pericardial effusion.  Lungs / Pleura: Superior segment left lower lobe pulmonary nodule measures 9 x 10 mm on today's study which compares to 10 x 8 mm previously. Although stable in the 3 month interval since the most recent comparison study, revealing more remote comparison studies again shows slow progressive enlargement of this nodule.  4 mm right lower lobe  pulmonary nodule is unchanged (image 24 series 3).  There is a new focal area of airspace disease in the posterior left upper lobe (see image 20 series 3).  MSK / Soft Tissues: Bone windows reveal no worrisome lytic or sclerotic osseous lesions.  Upper Abdomen:  Unremarkable.  IMPRESSION: Interval development of focal airspace disease in the posterior left upper lobe, suggesting pneumonia.  Superior segment left lower lobe pulmonary nodule measures stable since the most recent comparison study, but is again noted to have increased in size when comparing to older studies. Continued close attention will be required as neoplasm remains a concern.  Stable right lower lobe pulmonary nodule.  These results will be called to the ordering clinician or representative by the Radiologist Assistant, and communication documented in the PACS or zVision Dashboard.   Electronically Signed   By: Misty Stanley M.D.   On:  08/17/2014 12:44   Dg Shoulder Left  08/22/2014   CLINICAL DATA:  Left shoulder pain after fall going to the bathroom. Initial encounter.  EXAM: LEFT SHOULDER - 2+ VIEW  COMPARISON:  None.  FINDINGS: There is no evidence of acute fracture or dislocation.  Subacromial narrowing from acromial spurring and humeral head migration. There is glenohumeral marginal spurring without joint narrowing. No evidence of left chest wall trauma.  IMPRESSION: No acute osseous findings.   Electronically Signed   By: Monte Fantasia M.D.   On: 08/22/2014 13:59   Dg Hand Complete Right  08/22/2014   CLINICAL DATA:  Fall with right hand pain.  Initial encounter.  EXAM: RIGHT HAND - COMPLETE 3+ VIEW  COMPARISON:  09/18/2009  FINDINGS: No evidence of acute fracture or dislocation.  There is chronic widening of the scapholunate interval consistent with ligamentous insufficiency.  Prominent first CMC osteoarthritis with subluxation and spurring.  Chondrocalcinosis noted at the second MCP joint.  IMPRESSION: 1. No acute findings. 2.   Chronic scapholunate ligament insufficiency.   Electronically Signed   By: Monte Fantasia M.D.   On: 08/22/2014 14:01      ASSESSMENT AND PLAN:  Incidental pulmonary nodule, greater than or equal to 22m Continued enlargement of the superior segment left lower lobe nodule, concerning for malignancy.  Will perform PET imaging since lesion is 1 cm in size.      Return in 3 weeks for follow-up to review PET data.  Rectal cancer S/P definitive treatment with a curative intent as outlined in the oncology history.    Labs today: CBC diff, CMET, CEA, with repeat labs in 12 weeks.    THERAPY PLAN:  New pulmonary nodule that is enlarging requires work-up +/- biopsy.  Once diagnosed, subsequent treatment planning can take place.  All questions were answered. The patient knows to call the clinic with any problems, questions or concerns. We can certainly see the patient much sooner if necessary.  Patient and plan discussed with Dr. SAncil Linseyand she is in agreement with the aforementioned.   This note is electronically signed by: KRobynn Pane6/30/2016 2:08 PM

## 2014-09-15 NOTE — Patient Instructions (Signed)
[place] at [facility] Discharge Instructions  RECOMMENDATIONS MADE BY THE CONSULTANT AND ANY TEST RESULTS WILL BE SENT TO YOUR REFERRING PHYSICIAN.  Exam and discussion by Robynn Pane, PA-C Will get you scheduled for PET scan to evaluate the lung lesion that is enlarging to see what's going on.   PET scan to be done at [facility].  Nothing to eat or drink 6 hours prior to the scan.  Do not eat or drink anything with sugar in it.  No hard candy, chewing gum, etc.  Follow-up after the scans.  Thank you for choosing [place] at [facility] to provide your oncology and hematology care.  To afford each patient quality time with our provider, please arrive at least 15 minutes before your scheduled appointment time.    You need to re-schedule your appointment should you arrive 10 or more minutes late.  We strive to give you quality time with our providers, and arriving late affects you and other patients whose appointments are after yours.  Also, if you no show three or more times for appointments you may be dismissed from the clinic at the providers discretion.     Again, thank you for choosing [facility]  Our hope is that these requests will decrease the amount of time that you wait before being seen by our physicians.       _____________________________________________________________  Should you have questions after your visit to [facility], please contact our office at [phone] between the hours of 8:30 a.m. and 4:30 p.m.  Voicemails left after 4:30 p.m. will not be returned until the following business day.  For prescription refill requests, have your pharmacy contact our office.

## 2014-09-15 NOTE — Assessment & Plan Note (Addendum)
Continued enlargement of the superior segment left lower lobe nodule, concerning for malignancy.  Will perform PET imaging since lesion is 1 cm in size.      Return in 3 weeks for follow-up to review PET data.

## 2014-09-16 ENCOUNTER — Other Ambulatory Visit (HOSPITAL_COMMUNITY): Payer: Self-pay | Admitting: Oncology

## 2014-09-16 DIAGNOSIS — C20 Malignant neoplasm of rectum: Secondary | ICD-10-CM

## 2014-09-16 MED ORDER — DIPHENOXYLATE-ATROPINE 2.5-0.025 MG PO TABS: ORAL_TABLET | ORAL | Status: DC

## 2014-09-16 NOTE — Progress Notes (Signed)
LABS DRAWN

## 2014-09-22 ENCOUNTER — Other Ambulatory Visit (INDEPENDENT_AMBULATORY_CARE_PROVIDER_SITE_OTHER): Payer: Self-pay | Admitting: Internal Medicine

## 2014-09-29 ENCOUNTER — Ambulatory Visit (HOSPITAL_COMMUNITY)
Admission: RE | Admit: 2014-09-29 | Discharge: 2014-09-29 | Disposition: A | Discharge status: Home / Self Care | Payer: Medicare Other | Source: Ambulatory Visit | Attending: Oncology | Admitting: Oncology

## 2014-09-29 DIAGNOSIS — R911 Solitary pulmonary nodule: Secondary | ICD-10-CM

## 2014-09-30 ENCOUNTER — Telehealth (HOSPITAL_COMMUNITY): Payer: Self-pay | Admitting: Hematology & Oncology

## 2014-10-06 ENCOUNTER — Other Ambulatory Visit (HOSPITAL_COMMUNITY): Payer: Self-pay | Admitting: Oncology

## 2014-10-06 ENCOUNTER — Ambulatory Visit (HOSPITAL_COMMUNITY): Payer: Medicare Other | Admitting: Hematology & Oncology

## 2014-10-06 DIAGNOSIS — R911 Solitary pulmonary nodule: Secondary | ICD-10-CM

## 2014-10-07 ENCOUNTER — Encounter (HOSPITAL_COMMUNITY)
Admission: RE | Admit: 2014-10-07 | Discharge: 2014-10-07 | Disposition: A | Discharge status: Home / Self Care | Payer: Medicare Other | Source: Ambulatory Visit | Attending: Oncology | Admitting: Oncology

## 2014-10-07 DIAGNOSIS — R911 Solitary pulmonary nodule: Secondary | ICD-10-CM | POA: Insufficient documentation

## 2014-10-07 LAB — GLUCOSE, CAPILLARY: Glucose-Capillary: 82 mg/dL (ref 65–99)

## 2014-10-07 MED ORDER — FLUDEOXYGLUCOSE F - 18 (FDG) INJECTION
11.0900 | Freq: Once | INTRAVENOUS | Status: AC | PRN
  Administered 2014-10-07: 11.09 via INTRAVENOUS

## 2014-10-13 ENCOUNTER — Encounter (HOSPITAL_COMMUNITY): Payer: Medicare Other | Attending: Hematology and Oncology | Admitting: Hematology & Oncology

## 2014-10-13 ENCOUNTER — Encounter (HOSPITAL_COMMUNITY): Payer: Self-pay | Admitting: Hematology & Oncology

## 2014-10-13 VITALS — BP 175/79 | HR 77 | Temp 98.5°F | Resp 18 | Wt 222.6 lb

## 2014-10-13 DIAGNOSIS — Z87891 Personal history of nicotine dependence: Secondary | ICD-10-CM

## 2014-10-13 DIAGNOSIS — M899 Disorder of bone, unspecified: Secondary | ICD-10-CM

## 2014-10-13 DIAGNOSIS — R911 Solitary pulmonary nodule: Secondary | ICD-10-CM

## 2014-10-13 DIAGNOSIS — C20 Malignant neoplasm of rectum: Secondary | ICD-10-CM | POA: Diagnosis not present

## 2014-10-13 DIAGNOSIS — D649 Anemia, unspecified: Secondary | ICD-10-CM | POA: Insufficient documentation

## 2014-10-13 NOTE — Progress Notes (Signed)
Jennifer Bogus, MD [address] [place] [address]  Locally advanced Rectal Cancer  CURRENT THERAPY: Surveillance per NCCN guidelines and close surveillance of a slowly enlarging pulmonary nodule.  INTERVAL HISTORY: [name] 64 y.o. female returns for followup of Locally advanced adenocarcinoma of the rectum, tolerating combined modality therapy well. Radiation therapy completed on 05/04/2013 which was also the last day she received Xeloda, excellent tolerance, status post APR on 07/26/2013 with marked cytoreduction documented as evidenced by no lymph node involvement. pT3 tumor was found. S/P 6 months of Xeloda at 1000 mg BID 2 weeks on and 1 week off finishing in December 2015.   She is here with a care-worker today. She lives at [facility] and has been there several years. We discussed the results of her recent PET scan, including the pulmonary nodule. She is an ex smoker. She is unsure when she started smoking or when she stopped. She denies cough, weight loss or change in appetite.     Rectal cancer   02/18/2013 Initial Diagnosis Rectal cancer   03/25/2013 - 05/05/2013 Radiation Therapy Pelvis treatment from 1/8- 2/12 with rectal boost from 2/13- 05/05/2013.   03/25/2013 - 05/05/2013 Chemotherapy Xeloda 1500 mg BID 5 days/week with radiation therapy.   07/26/2013 Definitive Surgery Dr. Arnoldo Morale- Flexible sigmoidoscopy, abdominoperineal resection, total abdominal hysterectomy with bilateral salpingo-oophorectomy   08/30/2013 - 03/01/2014 Chemotherapy Xeloda 1000 mg BID 14 days on and 7 days off x 6 months   03/08/2014 Imaging CT CAP- Bilateral pulmonary nodules, including an 8 mm left lower lobe nodule. Metastatic disease is a concern. The left lower lobe nodule has progressed since 03/05/2013, when it measured 5 mm.   05/26/2014 Imaging CT Chest- Continued enlargement of the superior segment left lower lobe nodule. Malignancy is likely.   In  contrast, the right lower lobe nodule is probably benign.   08/17/2014 Imaging CT- Superior segment left lower lobe pulmonary nodule measures stable since the most recent comparison study, but is again noted to have increased in size when comparing to older studies. Continued close attention will be required as neoplasm remains a con       Past Medical History  Diagnosis Date   Hypertension    Diabetes mellitus     years   Asthma    Depression    Mental retardation     stopped school at 9th grade    History of recurrent UTIs    Shortness of breath     with exertion   Cancer     Rectal    Arthritis    Gout    Seizures     more than 4 yrs since last seizure. UNknown etiology   Rectal cancer    Iron deficiency 04/27/2014   Chronic renal disease, stage 3, moderately decreased glomerular filtration rate between 30-59 mL/min/1.73 square meter 04/27/2014   Numbness and tingling in hands     x several months    GERD (gastroesophageal reflux disease)     has DIABETES MELLITUS, TYPE II, UNCONTROLLED; HYPERLIPIDEMIA; GOUT NOS; Anemia; DEPRESSION; RETARDATION, MENTAL NOS; Essential hypertension; SINUS TACHYCARDIA; ALLERGIC RHINITIS; ASTHMA; ASTHMA, WITH ACUTE EXACERBATION; DISEASE, PANCREAS NOS; INTERTRIGO, CANDIDAL; ARTHRITIS; SEIZURE DISORDER; UNSPECIFIED SLEEP DISTURBANCE; FLANK PAIN, LEFT; LIVER FUNCTION TESTS, ABNORMAL; MIGRAINES, HX OF; Elevated liver enzymes; Helicobacter pylori gastritis; Rectal cancer; Incidental pulmonary nodule, greater than or equal to 53m; Abdominal pain; Insomnia; Iron deficiency; Chronic renal disease, stage 3, moderately decreased  glomerular filtration rate between 30-59 mL/min/1.73 square meter; and Lung nodule on her problem list.     has No Known Allergies.  Ms. Broccoli does not currently have medications on file.  Past Surgical History  Procedure Laterality Date   Abdominal hysterectomy     Multiple extractions with alveoloplasty N/A  11/23/2012    Procedure: MULTIPLE EXTRACION 1, 2, 4, 5, 6, 7, 8, 9, 10, 11, 12, 13, 14, 17, 18, 20, 23, 24, 25, 26, 28, 29, 32 WITH ALVEOLOPLASTY, REMOVE BILATERAL TORI;  Surgeon: Gae Bon, DDS;  Location: [facility];  Service: Oral Surgery;  Laterality: N/A;   Colonoscopy with esophagogastroduodenoscopy (egd) N/A 01/14/2013    Procedure: COLONOSCOPY WITH ESOPHAGOGASTRODUODENOSCOPY (EGD);  Surgeon: Rogene Houston, MD;  [facility];  Service: Endoscopy;  Laterality: N/A;  250-moved to 315 Ann to notify pt   Colonoscopy N/A 02/04/2013    Procedure: COLONOSCOPY;  Surgeon: Rogene Houston, MD;  [facility];  Service: Endoscopy;  Laterality: N/A;  225   Eus N/A 02/18/2013    Procedure: LOWER ENDOSCOPIC ULTRASOUND (EUS);  Surgeon: Milus Banister, MD;  Location[facility];  Service: Endoscopy;  Laterality: N/A;   Flexible sigmoidoscopy N/A 07/26/2013    Procedure: FLEXIBLE SIGMOIDOSCOPY;  Surgeon: Jamesetta So, MD;  [facility];  Service: General;  Laterality: N/A;   Abdominal perineal bowel resection N/A 07/26/2013    Procedure:  ABDOMINAL PERINEAL RESECTION;  Surgeon: Jamesetta So, MD;  [facility];  Service: General;  Laterality: N/A;   Supracervical abdominal hysterectomy N/A 07/26/2013    Procedure: HYSTERECTOMY SUPRACERVICAL ABDOMINAL ;  Surgeon: Jamesetta So, MD;  [facility];  Service: General;  Laterality: N/A;   Salpingoophorectomy Bilateral 07/26/2013    Procedure: SALPINGO OOPHORECTOMY;  Surgeon: Jamesetta So, MD;  [facility];  Service: General;  Laterality: Bilateral;   Colostomy Left 07/26/2013    Procedure: COLOSTOMY;  Surgeon: Jamesetta So, MD;  [facility];  Service: General;  Laterality: Left;   Colonoscopy N/A 05/12/2014    Procedure: COLONOSCOPY;  Surgeon: Rogene Houston, MD;  [facility];  Service: Endoscopy;  Laterality: N/A;  1030   Colon surgery  2015    bowel resection/w colostomy   Video assisted  thoracoscopy Left 10/26/2014    Procedure: LEFT VIDEO ASSISTED THORACOSCOPY;  Surgeon: [name], MD;  Location: [facility];  Service: Thoracic;  Laterality: Left;   Segmentecomy Left 10/26/2014    Procedure: LEFT LOWER LOBE SUPERIOR SEGMENTECTOMY;  Surgeon: [name], MD;  Location: [facility];  Service: Thoracic;  Laterality: Left;   Lymph node dissection Left 10/26/2014    Procedure: LYMPH NODE DISSECTION;  Surgeon: [name], MD;  Location: [facility];  Service: Thoracic;  Laterality: Left;    Denies any headaches, dizziness, double vision, fevers, chills, night sweats, nausea, vomiting, diarrhea, constipation, chest pain, heart palpitations, shortness of breath, blood in stool, black tarry stool, urinary pain, urinary burning, urinary frequency, hematuria. 14 point review of systems was performed and is negative except as detailed under history of present illness and above    PHYSICAL EXAMINATION  ECOG PERFORMANCE STATUS: 1  Filed Vitals:   10/13/14 1011  BP: 175/79  Pulse: 77  Temp: 98.5 F (36.9 C)  Resp: 18    GENERAL:alert, no distress, well nourished, well developed, comfortable, cooperative, obese, smiling and accompanied by Vickie from [place]. SKIN: skin color, texture, turgor are normal, no  rashes or significant lesions HEAD: Normocephalic, No masses, lesions, tenderness or abnormalities EYES: normal, PERRLA, EOMI, Conjunctiva are pink and non-injected EARS: External ears normal OROPHARYNX:lips, buccal mucosa, and tongue normal and mucous membranes are moist  NECK: supple, no adenopathy, thyroid normal size, non-tender, without nodularity, no stridor, non-tender, trachea midline LYMPH:  no palpable lymphadenopathy BREAST:not examined LUNGS: clear to auscultation  HEART: regular rate & rhythm, no murmurs and no gallops ABDOMEN:abdomen soft, obese and normal bowel sounds BACK: Back symmetric, no curvature., No CVA tenderness EXTREMITIES:less then 2  second capillary refill, no joint deformities, effusion, or inflammation, no skin discoloration, no clubbing, no cyanosis  NEURO: alert & oriented x 3 with fluent speech, no focal motor/sensory deficits, gait normal   LABORATORY DATA: I have reviewed the results listed below  Results for DAIRA, HINE (MRN [id number])   Ref. Range 09/15/2014 13:27  Sodium Latest Ref Range: 135-145 mmol/L 138  Potassium Latest Ref Range: 3.5-5.1 mmol/L 4.6  Chloride Latest Ref Range: 101-111 mmol/L 100 (L)  CO2 Latest Ref Range: 22-32 mmol/L 29  BUN Latest Ref Range: 6-20 mg/dL 27 (H)  Creatinine Latest Ref Range: 0.44-1.00 mg/dL 1.02 (H)  Calcium Latest Ref Range: 8.9-10.3 mg/dL 8.4 (L)  EGFR (Non-African Amer.) Latest Ref Range: >60 mL/min 57 (L)  EGFR (African American) Latest Ref Range: >60 mL/min >60  Glucose Latest Ref Range: 65-99 mg/dL 143 (H)  Anion gap Latest Ref Range: 5-15  9  Alkaline Phosphatase Latest Ref Range: 38-126 U/L 271 (H)  Albumin Latest Ref Range: 3.5-5.0 g/dL 3.7  AST Latest Ref Range: 15-41 U/L 18  ALT Latest Ref Range: 14-54 U/L 17  Total Protein Latest Ref Range: 6.5-8.1 g/dL 7.2  Total Bilirubin Latest Ref Range: 0.3-1.2 mg/dL 0.3  WBC Latest Ref Range: 4.0-10.5 K/uL 5.3  RBC Latest Ref Range: 3.87-5.11 MIL/uL 3.76 (L)  Hemoglobin Latest Ref Range: 12.0-15.0 g/dL 10.5 (L)  HCT Latest Ref Range: 36.0-46.0 % 33.2 (L)  MCV Latest Ref Range: 78.0-100.0 fL 88.3  MCH Latest Ref Range: 26.0-34.0 pg 27.9  MCHC Latest Ref Range: 30.0-36.0 g/dL 31.6  RDW Latest Ref Range: 11.5-15.5 % 12.8  Platelets Latest Ref Range: 150-400 K/uL 189  Neutrophils Latest Ref Range: 43-77 % 74  Lymphocytes Latest Ref Range: 12-46 % 12  Monocytes Relative Latest Ref Range: 3-12 % 8  Eosinophil Latest Ref Range: 0-5 % 6 (H)  Basophil Latest Ref Range: 0-1 % 0  NEUT# Latest Ref Range: 1.7-7.7 K/uL 3.9  Lymphocyte # Latest Ref Range: 0.7-4.0 K/uL 0.6 (L)  Monocyte # Latest Ref Range: 0.1-1.0  K/uL 0.4  Eosinophils Absolute Latest Ref Range: 0.0-0.7 K/uL 0.3  Basophils Absolute Latest Ref Range: 0.0-0.1 K/uL 0.0      RADIOGRAPHIC STUDIES:  CLINICAL DATA: Subsequent treatment strategy for restaging rectal cancer. Enlarging pulmonary nodule.  EXAM: NUCLEAR MEDICINE PET SKULL BASE TO THIGH COMPARISON: PET of 03/05/2013. Chest CTs of 08/17/14 and 05/26/2014.  IMPRESSION: 1. Hypermetabolism corresponding to the superior segment left lower lobe lung nodule. This could represent a primary bronchogenic carcinoma or isolated metastatic lesion. Primary bronchogenic carcinomas slightly favored. 2. No evidence of hypermetabolic metastatic disease. 3. Left axillary node is small and demonstrates low-level non malignant range hypermetabolism. Favor reactive etiology. 4. Heterogeneous marrow density and metabolism. The CT appearance has been present back to 2010. Favor Paget's disease. An area of more focal hypermetabolism in the left side of the sacrum is nonspecific and without focal CT correlate. Metastatic disease is felt unlikely but cannot  be entirely excluded. Recommend attention on follow-up.   Electronically Signed  By: Abigail Miyamoto M.D.  On: 10/07/2014 13:02  ASSESSMENT AND PLAN:  History of locally advanced rectal cancer Hypermetabolic pulmonary nodule Prior tobacco abuse Pagets disease of bone  I reviewed the PET imaging with the patient and her caregiver. I have recommended referral to cardiothoracic surgery in [facility] for additional recommendations.  She had an APR for her rectal cancer. I do not see where she has had recent endoscopy and we will need to address this moving forward. I think the pulmonary nodule takes precedence, we will address this at follow-up.  Last available mammogram is from 08/16/2013, this will also need to be addressed at follow-up. I have discussed screening issues with the patient and caregiver today.  All questions were  answered. The patient knows to call the clinic with any problems, questions or concerns. We can certainly see the patient much sooner if necessary.  This document serves as a record of services personally performed by Ancil Linsey, MD. It was created on her behalf by Arlyce Harman, a trained medical scribe. The creation of this record is based on the scribe's personal observations and the provider's statements to them. This document has been checked and approved by the attending provider.  I have reviewed the above documentation for accuracy and completeness, and I agree with the above.   Pen[place],Shannon Kristen 11/11/2014 10:50 AM

## 2014-10-13 NOTE — Patient Instructions (Signed)
[place] at [facility] Discharge Instructions  RECOMMENDATIONS MADE BY THE CONSULTANT AND ANY TEST RESULTS WILL BE SENT TO YOUR REFERRING PHYSICIAN.  Exam and discussion by Dr. Whitney Muse Will make referral to cardiovascular surgeon.  They will contact you with the date and time of your appointment  Follow-up here in 6 weeks.   Thank you for choosing [place] at [facility] to provide your oncology and hematology care.  To afford each patient quality time with our provider, please arrive at least 15 minutes before your scheduled appointment time.    You need to re-schedule your appointment should you arrive 10 or more minutes late.  We strive to give you quality time with our providers, and arriving late affects you and other patients whose appointments are after yours.  Also, if you no show three or more times for appointments you may be dismissed from the clinic at the providers discretion.     Again, thank you for choosing [facility]  Our hope is that these requests will decrease the amount of time that you wait before being seen by our physicians.       _____________________________________________________________  Should you have questions after your visit to [facility], please contact our office at [phone] between the hours of 8:30 a.m. and 4:30 p.m.  Voicemails left after 4:30 p.m. will not be returned until the following business day.  For prescription refill requests, have your pharmacy contact our office.

## 2014-10-18 ENCOUNTER — Encounter: Payer: Self-pay | Admitting: Thoracic Surgery (Cardiothoracic Vascular Surgery)

## 2014-10-18 ENCOUNTER — Institutional Professional Consult (permissible substitution) (INDEPENDENT_AMBULATORY_CARE_PROVIDER_SITE_OTHER): Payer: Medicare Other | Admitting: Thoracic Surgery (Cardiothoracic Vascular Surgery)

## 2014-10-18 ENCOUNTER — Other Ambulatory Visit: Payer: Self-pay | Admitting: *Deleted

## 2014-10-18 VITALS — BP 173/90 | HR 110 | Resp 16 | Ht 64.0 in | Wt 222.0 lb

## 2014-10-18 DIAGNOSIS — R911 Solitary pulmonary nodule: Secondary | ICD-10-CM

## 2014-10-18 DIAGNOSIS — Z85048 Personal history of other malignant neoplasm of rectum, rectosigmoid junction, and anus: Secondary | ICD-10-CM

## 2014-10-18 DIAGNOSIS — D381 Neoplasm of uncertain behavior of trachea, bronchus and lung: Secondary | ICD-10-CM | POA: Diagnosis not present

## 2014-10-18 NOTE — Progress Notes (Signed)
PCP is [name], MD Referring Provider is Penland, Kelby Fam, MD  Chief Complaint  Patient presents with  . Lung Lesion    Surgical eval on Enlarging pulmonary nodule Left lower lobe, PET Scan 10/07/14, Chest CT 08/17/14, HX of rectal cancer    HPI: [name] is a 64 year old woman with a past medical history significant for rectal cancer (T3, N0), uncontrolled type 2 diabetes, hyperlipidemia, anemia, asthma, seizure disorder, arthritis, and stage III chronic kidney disease. She is mentally challenged and lives in a group home. She is a poor historian and history and review of systems are limited. She is accompanied by a worker from the group home.  She was treated with chemotherapy, radiation, and surgery (APR) for rectal cancer. She has been followed with serial CT scans. She was noted to have a nodule in the superior segment of the left lower lung. On her most recent CT the nodule measured 8 x 10 mm. A PET CT was done which showed the nodule to be 11.5 mm in diameter and was hypermetabolic with an SUV of 7.9.  She has an unknown smoking history. By report she quit smoking years ago but did have some regular smoking, today she says that she only smoked 1 cigarette in her life. She says she gets short of breath with exertion, but can walk up a ramp and down the entire length of the hall at her home without any stopping to rest. She denies wheezing but is on the bronchial dilators. She denies cough and hemoptysis. She denies weight loss and change in appetite.  Past Medical History  Diagnosis Date  . Hypertension   . Diabetes mellitus     years  . Asthma   . Depression   . Mental retardation   . History of recurrent UTIs   . Shortness of breath     with exertion  . Cancer     Rectal   . Arthritis   . Gout   . Seizures     more than 4 yrs since last seizure. UNknown etiology  . Rectal cancer   . Iron deficiency 04/27/2014  . Chronic renal disease, stage 3, moderately  decreased glomerular filtration rate between 30-59 mL/min/1.73 square meter 04/27/2014    Past Surgical History  Procedure Laterality Date  . Abdominal hysterectomy    . Multiple extractions with alveoloplasty N/A 11/23/2012    Procedure: MULTIPLE EXTRACION 1, 2, 4, 5, 6, 7, 8, 9, 10, 11, 12, 13, 14, 17, 18, 20, 23, 24, 25, 26, 28, 29, 32 WITH ALVEOLOPLASTY, REMOVE BILATERAL TORI;  Surgeon: Gae Bon, DDS;  Location: [facility];  Service: Oral Surgery;  Laterality: N/A;  . Colonoscopy with esophagogastroduodenoscopy (egd) N/A 01/14/2013    Procedure: COLONOSCOPY WITH ESOPHAGOGASTRODUODENOSCOPY (EGD);  Surgeon: Rogene Houston, MD;  [facility];  Service: Endoscopy;  Laterality: N/A;  250-moved to 315 Ann to notify pt  . Colonoscopy N/A 02/04/2013    Procedure: COLONOSCOPY;  Surgeon: Rogene Houston, MD;  [facility];  Service: Endoscopy;  Laterality: N/A;  225  . Eus N/A 02/18/2013    Procedure: LOWER ENDOSCOPIC ULTRASOUND (EUS);  Surgeon: Milus Banister, MD;  Location: [facility];  Service: Endoscopy;  Laterality: N/A;  . Flexible sigmoidoscopy N/A 07/26/2013    Procedure: FLEXIBLE SIGMOIDOSCOPY;  Surgeon: Jamesetta So, MD;  Location: [facility];  Service: General;  Laterality: N/A;  . Abdominal perineal bowel resection N/A 07/26/2013    Procedure:  ABDOMINAL PERINEAL RESECTION;  Surgeon: Jamesetta So, MD;  Location: [facility];  Service: General;  Laterality: N/A;  . Supracervical abdominal hysterectomy N/A 07/26/2013    Procedure: HYSTERECTOMY SUPRACERVICAL ABDOMINAL ;  Surgeon: Jamesetta So, MD;  Location: [facility];  Service: General;  Laterality: N/A;  . Salpingoophorectomy Bilateral 07/26/2013    Procedure: SALPINGO OOPHORECTOMY;  Surgeon: Jamesetta So, MD;  Location[facility];  Service: General;  Laterality: Bilateral;  . Colostomy Left 07/26/2013    Procedure: COLOSTOMY;  Surgeon: Jamesetta So, MD;  Location[facility];  Service: General;  Laterality: Left;  . Colonoscopy  N/A 05/12/2014    Procedure: COLONOSCOPY;  Surgeon: Rogene Houston, MD;  [facility];  Service: Endoscopy;  Laterality: N/A;  1030    No family history on file.  Social History History  Substance Use Topics  . Smoking status: Former Smoker    Types: Cigarettes    Quit date: 07/16/2008  . Smokeless tobacco: Never Used     Comment: unknown when stopped smoking      long time  . Alcohol Use: No    Current Outpatient Prescriptions  Medication Sig Dispense Refill  . amLODipine (NORVASC) 5 MG tablet Take 1 tablet (5 mg total) by mouth daily. 30 tablet 1  . aspirin EC 81 MG tablet Take 81 mg by mouth daily.    . diphenoxylate-atropine (LOMOTIL) 2.5-0.025 MG per tablet TAKE 1 TABLET BY MOUTH EVERY 2 HOURS AS NEEDED FOR DIARRHEA/LOOSE STOOL. 30 tablet 1  . docusate sodium (COLACE) 100 MG capsule Take 100 mg by mouth 2 (two) times daily.    . ferrous sulfate 325 (65 FE) MG tablet TAKE (1) TABLET BY MOUTH TWICE DAILY. 60 tablet 1  . fish oil-omega-3 fatty acids 1000 MG capsule Take 1 g by mouth 2 (two) times daily.    . Fluticasone-Salmeterol (ADVAIR) 250-50 MCG/DOSE AEPB Inhale 1 puff into the lungs every 12 (twelve) hours.    [name] HYDROcodone-acetaminophen (NORCO/VICODIN) 5-325 MG per tablet Take 1 tablet by mouth every 6 (six) hours as needed for moderate pain. For pain not relieved by tramadol    . ibuprofen (ADVIL,MOTRIN) 600 MG tablet Take 600 mg by mouth every 6 (six) hours as needed for moderate pain.     . Insulin Glargine (LANTUS SOLOSTAR) 100 UNIT/ML SOPN Inject 15 Units into the skin at bedtime.     [name] loratadine-pseudoephedrine (LORATADINE-D 24HR) 10-240 MG per 24 hr tablet Take 1 tablet by mouth daily.    . metFORMIN (GLUCOPHAGE) 1000 MG tablet Take 1,000 mg by mouth 2 (two) times daily with a meal.    . metoprolol succinate (TOPROL-XL) 50 MG 24 hr tablet Take 100 mg by mouth at bedtime. Take with or immediately following a meal.    . montelukast (SINGULAIR) 10 MG tablet  Take 10 mg by mouth at bedtime.    . pantoprazole (PROTONIX) 40 MG tablet Take 40 mg by mouth daily.    . phenytoin (DILANTIN) 100 MG ER capsule Take 100 mg by mouth 2 (two) times daily.    . pravastatin (PRAVACHOL) 40 MG tablet Take 40 mg by mouth daily.    . silver sulfADIAZINE (SILVADENE) 1 % cream Apply 1 application topically daily.    . sodium chloride (OCEAN) 0.65 % SOLN nasal spray Place 2 sprays into both nostrils 4 (four) times daily as needed for congestion.    . traMADol (ULTRAM) 50 MG tablet Take by mouth every 6 (six) hours  as needed.    . triamcinolone cream (KENALOG) 0.1 % Apply 1 application topically daily. To legs    . Emollient (CERAVE) LOTN Apply 1 application topically daily. Apply to feet     No current facility-administered medications for this visit.    No Known Allergies  Review of Systems  Constitutional: Negative for fever, chills, appetite change and unexpected weight change.  Respiratory: Positive for shortness of breath (with exertion) and wheezing. Negative for cough.   Cardiovascular: Positive for leg swelling. Negative for chest pain.  Gastrointestinal: Negative for abdominal pain.       S/p APR for rectal cancer  Musculoskeletal: Positive for gait problem.  Neurological: Positive for seizures (none recently).    BP 173/90 mmHg  Pulse 110  Resp 16  Ht '5\' 4"'$  (1.626 m)  Wt 222 lb (100.699 kg)  BMI 38.09 kg/m2  SpO2 97% Physical Exam  Constitutional: No distress.  obese  HENT:  Head: Normocephalic and atraumatic.  Eyes: EOM are normal. No scleral icterus.  Neck: Neck supple.  Cardiovascular: Normal rate, regular rhythm and normal heart sounds.   No murmur heard. Pulmonary/Chest: Effort normal and breath sounds normal. She has no wheezes. She has no rales.  Abdominal: Soft.  ostomy  Musculoskeletal: She exhibits edema (2+ bilaterally).  Lymphadenopathy:    She has no cervical adenopathy.  Neurological: She is alert.  No focal deficit   Skin: Skin is warm and dry.  Vitals reviewed.    Diagnostic Tests: CT CHEST WITHOUT CONTRAST  TECHNIQUE: Multidetector CT imaging of the chest was performed following the standard protocol without IV contrast.  COMPARISON: 05/26/2014. 03/07/2014.  FINDINGS: Mediastinum / Lymph Nodes: There is no axillary lymphadenopathy. No mediastinal or hilar lymphadenopathy. Heart size is normal. No pericardial effusion.  Lungs / Pleura: Superior segment left lower lobe pulmonary nodule measures 9 x 10 mm on today's study which compares to 10 x 8 mm previously. Although stable in the 3 month interval since the most recent comparison study, revealing more remote comparison studies again shows slow progressive enlargement of this nodule.  4 mm right lower lobe pulmonary nodule is unchanged (image 24 series 3).  There is a new focal area of airspace disease in the posterior left upper lobe (see image 20 series 3).  MSK / Soft Tissues: Bone windows reveal no worrisome lytic or sclerotic osseous lesions.  Upper Abdomen: Unremarkable.  IMPRESSION: Interval development of focal airspace disease in the posterior left upper lobe, suggesting pneumonia.  Superior segment left lower lobe pulmonary nodule measures stable since the most recent comparison study, but is again noted to have increased in size when comparing to older studies. Continued close attention will be required as neoplasm remains a concern.  Stable right lower lobe pulmonary nodule.  These results will be called to the ordering clinician or representative by the Radiologist Assistant, and communication documented in the PACS or zVision Dashboard.   Electronically Signed  By: Misty Stanley M.D.  On: 08/17/2014 12:44  NUCLEAR MEDICINE PET SKULL BASE TO THIGH  TECHNIQUE: 11.1 mCi F-18 FDG was injected intravenously. Full-ring PET imaging was performed from the skull base to thigh after the  radiotracer. CT data was obtained and used for attenuation correction and anatomic localization.  FASTING BLOOD GLUCOSE: Value: 82 mg/dl  COMPARISON: PET of 03/05/2013. Chest CTs of 08/17/14 and 05/26/2014.  FINDINGS: NECK  No areas of abnormal hypermetabolism.  CHEST  Hypermetabolism which corresponds to the superior segment left lower lobe pulmonary nodule. This  measures 12 x 11 mm and a S.U.V. max of 7.9, including on image 23 of series 8. This is enlarged when compared to remote prior exams.  A left axillary node measures 7 mm and a S.U.V. max of 1.7. Not pathologic by size criteria and non malignant range activity. Similar size nodes in this area on 03/05/2013.  ABDOMEN/PELVIS  No areas of abnormal hypermetabolism.  SKELETON  Heterogeneous marrow activity is identified throughout. This corresponds to chronic heterogeneous marrow density with areas of sclerosis and lucency. Somewhat more focal area of hypermetabolism involving the left sacrum is without focal CT correlate and measures a S.U.V. max of 5.8.  CT IMAGES PERFORMED FOR ATTENUATION CORRECTION  No cervical adenopathy. Right lower lobe pulmonary nodule which is below the resolution of PET. No lobar consolidation. Bilateral adrenal thickening. Left renal atrophy. Motion degradation within the abdomen and pelvis especially. Descending colostomy with mild redundancy within. Heterogeneous marrow density which has been present back to 2010. Areas of cortical thickening, most apparent within the pelvis.  IMPRESSION: 1. Hypermetabolism corresponding to the superior segment left lower lobe lung nodule. This could represent a primary bronchogenic carcinoma or isolated metastatic lesion. Primary bronchogenic carcinomas slightly favored. 2. No evidence of hypermetabolic metastatic disease. 3. Left axillary node is small and demonstrates low-level non malignant range hypermetabolism. Favor reactive  etiology. 4. Heterogeneous marrow density and metabolism. The CT appearance has been present back to 2010. Favor Paget's disease. An area of more focal hypermetabolism in the left side of the sacrum is nonspecific and without focal CT correlate. Metastatic disease is felt unlikely but cannot be entirely excluded. Recommend attention on follow-up.   Electronically Signed  By: Abigail Miyamoto M.D.  On: 10/07/2014 13:02  I personally reviewed the CT and PET CT and concur with the findings as noted above as relates to the lung nodule.  Impression: 64 year old woman with an unknown smoking history and a history of rectal cancer who has a nodule in the superior segment of the left lower lobe. This nodule has grown over time and is hypermetabolic on PET CT. It is highly suspicious for a neoplasm. This could represent either a new primary bronchogenic carcinoma or metastasis from rectal cancer. It could potentially be infectious or inflammatory, but needs to be considered cancer unless he can be proven otherwise.  I recommended to [name] that we do a left VATS and left lower lobe superior segmentectomy for definitive diagnosis and treatment of the lung nodule. She has limited understanding and we will need to obtain consent from [name] before proceeding with surgery.  She does need pulmonary function testing prior to lung surgery, although I'm not sure how well she will do with that terms of understanding and cooperation.  Plan:  Pulmonary function testing Left VATS, left lower lobe superior segmentectomy once consent obtained  I spent 30 minutes with [name] during this visit, greater than 50% was spent in counseling. Jennifer Nakayama, MD [facility] [phone]

## 2014-10-19 ENCOUNTER — Other Ambulatory Visit: Payer: Self-pay | Admitting: *Deleted

## 2014-10-19 ENCOUNTER — Encounter (INDEPENDENT_AMBULATORY_CARE_PROVIDER_SITE_OTHER): Payer: Self-pay | Admitting: *Deleted

## 2014-10-19 DIAGNOSIS — R911 Solitary pulmonary nodule: Secondary | ICD-10-CM

## 2014-10-21 ENCOUNTER — Other Ambulatory Visit (HOSPITAL_COMMUNITY): Payer: Self-pay | Admitting: Respiratory Therapy

## 2014-10-21 ENCOUNTER — Ambulatory Visit (HOSPITAL_COMMUNITY)
Admission: RE | Admit: 2014-10-21 | Discharge: 2014-10-21 | Disposition: A | Discharge status: Home / Self Care | Payer: Medicare Other | Source: Ambulatory Visit | Attending: Thoracic Surgery (Cardiothoracic Vascular Surgery) | Admitting: Thoracic Surgery (Cardiothoracic Vascular Surgery)

## 2014-10-21 DIAGNOSIS — R911 Solitary pulmonary nodule: Secondary | ICD-10-CM

## 2014-10-21 NOTE — Progress Notes (Signed)
[name] came in today for a PFT, however she was not able to complete the test.  We began with the [facility] and she was not able to mentally comprehend what she needed to do.  She was unable to keep a seal around the mouth piece and just was unable to follow most instructional commands.  Her caregiver said she would not be able to do this due to her mental retardation.  Secretary is letting [name] office know about this.

## 2014-10-24 ENCOUNTER — Encounter (HOSPITAL_COMMUNITY): Payer: Self-pay

## 2014-10-24 ENCOUNTER — Encounter (HOSPITAL_COMMUNITY)
Admission: RE | Admit: 2014-10-24 | Discharge: 2014-10-24 | Disposition: A | Discharge status: Home / Self Care | Payer: Medicare Other | Source: Ambulatory Visit | Attending: Thoracic Surgery (Cardiothoracic Vascular Surgery) | Admitting: Thoracic Surgery (Cardiothoracic Vascular Surgery)

## 2014-10-24 ENCOUNTER — Other Ambulatory Visit: Payer: Self-pay

## 2014-10-24 ENCOUNTER — Other Ambulatory Visit: Payer: Self-pay | Admitting: *Deleted

## 2014-10-24 ENCOUNTER — Ambulatory Visit (HOSPITAL_COMMUNITY)
Admission: RE | Admit: 2014-10-24 | Discharge: 2014-10-24 | Disposition: A | Discharge status: Home / Self Care | Payer: Medicare Other | Source: Ambulatory Visit | Attending: Thoracic Surgery (Cardiothoracic Vascular Surgery) | Admitting: Thoracic Surgery (Cardiothoracic Vascular Surgery)

## 2014-10-24 VITALS — BP 168/67 | HR 82 | Temp 97.7°F | Resp 18 | Ht 64.0 in | Wt 229.1 lb

## 2014-10-24 DIAGNOSIS — J45909 Unspecified asthma, uncomplicated: Secondary | ICD-10-CM

## 2014-10-24 DIAGNOSIS — Z794 Long term (current) use of insulin: Secondary | ICD-10-CM | POA: Insufficient documentation

## 2014-10-24 DIAGNOSIS — Z79899 Other long term (current) drug therapy: Secondary | ICD-10-CM

## 2014-10-24 DIAGNOSIS — Z87891 Personal history of nicotine dependence: Secondary | ICD-10-CM

## 2014-10-24 DIAGNOSIS — Z0183 Encounter for blood typing: Secondary | ICD-10-CM | POA: Insufficient documentation

## 2014-10-24 DIAGNOSIS — I129 Hypertensive chronic kidney disease with stage 1 through stage 4 chronic kidney disease, or unspecified chronic kidney disease: Secondary | ICD-10-CM

## 2014-10-24 DIAGNOSIS — Z01818 Encounter for other preprocedural examination: Secondary | ICD-10-CM

## 2014-10-24 DIAGNOSIS — Z01812 Encounter for preprocedural laboratory examination: Secondary | ICD-10-CM

## 2014-10-24 DIAGNOSIS — Z9221 Personal history of antineoplastic chemotherapy: Secondary | ICD-10-CM | POA: Insufficient documentation

## 2014-10-24 DIAGNOSIS — R9431 Abnormal electrocardiogram [ECG] [EKG]: Secondary | ICD-10-CM | POA: Insufficient documentation

## 2014-10-24 DIAGNOSIS — Z7982 Long term (current) use of aspirin: Secondary | ICD-10-CM | POA: Insufficient documentation

## 2014-10-24 DIAGNOSIS — R911 Solitary pulmonary nodule: Secondary | ICD-10-CM

## 2014-10-24 DIAGNOSIS — Z85048 Personal history of other malignant neoplasm of rectum, rectosigmoid junction, and anus: Secondary | ICD-10-CM | POA: Insufficient documentation

## 2014-10-24 DIAGNOSIS — N183 Chronic kidney disease, stage 3 (moderate): Secondary | ICD-10-CM

## 2014-10-24 DIAGNOSIS — K219 Gastro-esophageal reflux disease without esophagitis: Secondary | ICD-10-CM

## 2014-10-24 DIAGNOSIS — Z923 Personal history of irradiation: Secondary | ICD-10-CM | POA: Insufficient documentation

## 2014-10-24 DIAGNOSIS — E1122 Type 2 diabetes mellitus with diabetic chronic kidney disease: Secondary | ICD-10-CM | POA: Insufficient documentation

## 2014-10-24 DIAGNOSIS — F79 Unspecified intellectual disabilities: Secondary | ICD-10-CM | POA: Insufficient documentation

## 2014-10-24 HISTORY — DX: Paresthesia of skin: R20.2

## 2014-10-24 HISTORY — DX: Gastro-esophageal reflux disease without esophagitis: K21.9

## 2014-10-24 HISTORY — DX: Anesthesia of skin: R20.0

## 2014-10-24 LAB — COMPREHENSIVE METABOLIC PANEL
ALBUMIN: 3.7 g/dL (ref 3.5–5.0)
ALK PHOS: 306 U/L — AB (ref 38–126)
ALT: 19 U/L (ref 14–54)
ANION GAP: 12 (ref 5–15)
AST: 21 U/L (ref 15–41)
BUN: 27 mg/dL — AB (ref 6–20)
CALCIUM: 8.5 mg/dL — AB (ref 8.9–10.3)
CO2: 23 mmol/L (ref 22–32)
CREATININE: 1.07 mg/dL — AB (ref 0.44–1.00)
Chloride: 102 mmol/L (ref 101–111)
GFR calc non Af Amer: 54 mL/min — ABNORMAL LOW (ref >60)
GLUCOSE: 102 mg/dL — AB (ref 65–99)
POTASSIUM: 4.7 mmol/L (ref 3.5–5.1)
Sodium: 137 mmol/L (ref 135–145)
Total Bilirubin: 0.2 mg/dL — ABNORMAL LOW (ref 0.3–1.2)
Total Protein: 7.1 g/dL (ref 6.5–8.1)

## 2014-10-24 LAB — CBC
HCT: 32.6 % — ABNORMAL LOW (ref 36.0–46.0)
HEMOGLOBIN: 10.3 g/dL — AB (ref 12.0–15.0)
MCH: 27.3 pg (ref 26.0–34.0)
MCHC: 31.6 g/dL (ref 30.0–36.0)
MCV: 86.5 fL (ref 78.0–100.0)
PLATELETS: 172 10*3/uL (ref 150–400)
RBC: 3.77 MIL/uL — AB (ref 3.87–5.11)
RDW: 13.5 % (ref 11.5–15.5)
WBC: 4.4 10*3/uL (ref 4.0–10.5)

## 2014-10-24 LAB — TYPE AND SCREEN
ABO/RH(D): A POS
Antibody Screen: NEGATIVE

## 2014-10-24 LAB — URINALYSIS, ROUTINE W REFLEX MICROSCOPIC
BILIRUBIN URINE: NEGATIVE
Glucose, UA: NEGATIVE mg/dL
HGB URINE DIPSTICK: NEGATIVE
Ketones, ur: NEGATIVE mg/dL
Leukocytes, UA: NEGATIVE
NITRITE: NEGATIVE
PH: 6 (ref 5.0–8.0)
Protein, ur: NEGATIVE mg/dL
Specific Gravity, Urine: 1.007 (ref 1.005–1.030)
Urobilinogen, UA: 0.2 mg/dL (ref 0.0–1.0)

## 2014-10-24 LAB — BLOOD GAS, ARTERIAL
Acid-Base Excess: 0.9 mmol/L (ref 0.0–2.0)
BICARBONATE: 25.3 meq/L — AB (ref 20.0–24.0)
DRAWN BY: 42180
FIO2: 0.21
O2 SAT: 98.4 %
PCO2 ART: 42.5 mmHg (ref 35.0–45.0)
PO2 ART: 120 mmHg — AB (ref 80.0–100.0)
Patient temperature: 98.6
TCO2: 26.6 mmol/L (ref 0–100)
pH, Arterial: 7.392 (ref 7.350–7.450)

## 2014-10-24 LAB — PROTIME-INR
INR: 1.04 (ref 0.00–1.49)
Prothrombin Time: 13.8 seconds (ref 11.6–15.2)

## 2014-10-24 LAB — SURGICAL PCR SCREEN
MRSA, PCR: POSITIVE — AB
STAPHYLOCOCCUS AUREUS: POSITIVE — AB

## 2014-10-24 LAB — ABO/RH: ABO/RH(D): A POS

## 2014-10-24 LAB — GLUCOSE, CAPILLARY: GLUCOSE-CAPILLARY: 138 mg/dL — AB (ref 65–99)

## 2014-10-24 LAB — APTT: aPTT: 27 seconds (ref 24–37)

## 2014-10-24 NOTE — Progress Notes (Signed)
[name], Administrator for assisted living facility that pt resides was notified of positive PCR of MRSA and Staph. I explained to her that I would call the Rx into [facility] in [place] in the AM, they were closed at this time. She voiced understanding.

## 2014-10-24 NOTE — Progress Notes (Signed)
Pt. Here withGuardian fr. [facility], pt. Is a ward of the state for mental impairment. Guardian will fax documents later today.

## 2014-10-24 NOTE — Progress Notes (Signed)
Improper MAR sent with Guardian, new MAR will be faxed soon.  Call to [facility]. Tech., to review meds.

## 2014-10-24 NOTE — Pre-Procedure Instructions (Signed)
[name]  10/24/2014     No Pharmacies Listed   Your procedure is scheduled on 10/26/2014  Report to [facility] Admitting at 8:30 A.M.  Call this number if you have problems the morning of surgery:  [phone]   Remember:  Do not eat food or drink liquids after midnight.  Take these medicines the morning of surgery with A SIP OF WATER: Phenytoin, Pantoprazole,Advair, Amlodipine,  .........  Pain  Medicine is OK the morning of surgery if she needs it   Do not wear jewelry, make-up or nail polish.   Do not wear lotions, powders, or perfumes.  You may wear deodorant.   Do not shave 48 hours prior to surgery.    Do not bring valuables to the hospital.   [facility] is not responsible for any belongings or valuables.  Contacts, dentures or bridgework may not be worn into surgery.  Leave your suitcase in the car.  After surgery it may be brought to your room.  For patients admitted to the hospital, discharge time will be determined by your treatment team.  Patients discharged the day of surgery will not be allowed to drive home.   Name and phone number of your driver:   With Guardian Special instructions:  Special Instructions: [facility] - Preparing for Surgery  Before surgery, you can play an important role.  Because skin is not sterile, your skin needs to be as free of germs as possible.  You can reduce the number of germs on you skin by washing with CHG (chlorahexidine gluconate) soap before surgery.  CHG is an antiseptic cleaner which kills germs and bonds with the skin to continue killing germs even after washing.  Please DO NOT use if you have an allergy to CHG or antibacterial soaps.  If your skin becomes reddened/irritated stop using the CHG and inform your nurse when you arrive at Short Stay.  Do not shave (including legs and underarms) for at least 48 hours prior to the first CHG shower.  You may shave your face.  Please follow these instructions  carefully:   1.  Shower with CHG Soap the night before surgery and the  morning of Surgery.  2.  If you choose to wash your hair, wash your hair first as usual with your  normal shampoo.  3.  After you shampoo, rinse your hair and body thoroughly to remove the  Shampoo.  4.  Use CHG as you would any other liquid soap.  You can apply chg directly to the skin and wash gently with scrungie or a clean washcloth.  5.  Apply the CHG Soap to your body ONLY FROM THE NECK DOWN.    Do not use on open wounds or open sores.  Avoid contact with your eyes, ears, mouth and genitals (private parts).  Wash genitals (private parts)   with your normal soap.  6.  Wash thoroughly, paying special attention to the area where your surgery will be performed.  7.  Thoroughly rinse your body with warm water from the neck down.  8.  DO NOT shower/wash with your normal soap after using and rinsing off   the CHG Soap.  9.  Pat yourself dry with a clean towel.            10.  Wear clean pajamas.            11.  Place clean sheets on your bed the night of your first  shower and do not sleep with pets.  Day of Surgery  Do not apply any lotions/deodorants the morning of surgery.  Please wear clean clothes to the hospital/surgery center.  Please read over the following fact sheets that you were given. Pain Booklet, Coughing and Deep Breathing, Blood Transfusion Information, MRSA Information and Surgical Site Infection Prevention     IN ADDITION:    VERY IMPORTANT>>>>>>>>>>>>>>>>>>>>>>>>>>>>>>>>>>>>>  How to Manage Your Diabetes Before Surgery   Why is it important to control my blood sugar before and after surgery?   Improving blood sugar levels before and after surgery helps healing and can limit problems.  A way of improving blood sugar control is eating a healthy diet by:  - Eating less sugar and carbohydrates  - Increasing activity/exercise  - Talk with your doctor about reaching your blood sugar goals  High  blood sugars (greater than 180 mg/dL) can raise your risk of infections and slow down your recovery so you will need to focus on controlling your diabetes during the weeks before surgery.  Make sure that the doctor who takes care of your diabetes knows about your planned surgery including the date and location.  How do I manage my blood sugars before surgery?   Check your blood sugar at least 4 times a day, 2 days before surgery to make sure that they are not too high or low.   Check your blood sugar the morning of your surgery when you wake up and every 2               hours until you get to the Short-Stay unit.  If your blood sugar is less than 70 mg/dL, you will need to treat for low blood sugar by:  Treat a low blood sugar (less than 70 mg/dL) with 1/2 cup of clear juice (cranberry or apple), 4 glucose tablets, OR glucose gel.  Recheck blood sugar in 15 minutes after treatment (to make sure it is greater than 70 mg/dL).  If blood sugar is not greater than 70 mg/dL on re-check, call [phone] for further instructions.   Report your blood sugar to the Short-Stay nurse when you get to Short-Stay.  References:  [facility], 2007 "How to Manage your Diabetes Before and After Surgery".  What do I do about my diabetes medications?   Do not take oral diabetes medicines (pills) the morning of surgery.   THE NIGHT BEFORE SURGERY, take 10 units of Lantus Insulin.     DO NOT TAKE METFORMIN  The morning of your surgery    Do not take other diabetes injectables the day of surgery including Byetta, Victoza, Bydureon

## 2014-10-25 NOTE — Progress Notes (Signed)
Mupirocin Ointment Rx called into [facility] in [place], spoke with [facility], pharmacist. Pt positive for both MRSA and Staph.

## 2014-10-25 NOTE — Progress Notes (Signed)
Call to DSS to request guardianship papers, [phone] ext [phone]. Left a message for Debbora Presto to please fax guardianship papers for Pt having surgery tomorrow. Fax [phone] and phone [phone] given on message.

## 2014-10-25 NOTE — Progress Notes (Signed)
Anesthesia chart review: Patient is a 64 year old female scheduled for left VATS, LLL superior segmentectomy on 10/26/14 by Dr. Roxan Hockey. DX: LLL nodule.  History includes DM2, HTN, asthma, seizures (on Dilantin), mental retardation, recurrent UTIs, former smoker, multiple teeth extraction '14, rectal cancer (T3, N0) s/p abdominal perineal bowel resection with colostomy and hysterectomy 07/26/13 s/p chemoradiation, iron deficiency anemia, CKD stage III, GERD, paresthesia (hands). BMI is consistent with obesity. PCP is Dr. Sinda Du. GI is Dr. Laural Golden. HEM-ONC is with [facility]. She is a resident at [facility] in [place]. Patient's guardian CSW can be reached at [phone], ext [phone].   Meds include amlodipine, ASA '81mg'$ , 65 Fe, fish oil, Advair, Norco, Lantus, Loratadine-D, Toprol XL, Singulair, Protonix, Dilantin, pravastatin, tramadol.   10/24/14 EKG: NSR, possible LAE, inferior infarct (age undetermined), cannot rule out anterior infarct (age undetermined). No significant change since last tracings in 07/2013 and 09/2012.   Echo on 03/08/08 showed: - Overall left ventricular systolic function was vigorous. Left ventricular ejection fraction was estimated to be 65 %. Left ventricular wall thickness was moderately increased. - The aortic valve was mildly calcified. Mild tricuspid valve regurgitation. - Small hyperdynamic ventricle with near cavity obliteration in the mid-LV during systole. Consider beta blockade and hydration if clinically indicated.  10/24/14 CXR: No change in nodular lesion within the superior segment of the left lower lobe.  Patient was unable to complete PFTs. Notes indicate that she was could not mentally comprehend what she needed to do.  Preoperative labs noted.Cr 1.07. Glucose 102. H/H 10.3/32.6. PT/PTT WNL. T&S done. If she has not had an A1C done within the past 60 days, then plan to do on arrival.   Patient's EKG appears stable for at least the past two  years. She tolerated colon resection last year. If no acute changes then I would anticipate that she could proceed as planned.  Chart left for RN follow-up regarding guardianship papers.  George Hugh [facility] Short Stay Center/Anesthesiology Phone [phone] 10/25/2014 9:56 AM

## 2014-10-26 ENCOUNTER — Inpatient Hospital Stay (HOSPITAL_COMMUNITY)
Admission: RE | Admit: 2014-10-26 | Discharge: 2014-10-31 | DRG: 164 | Disposition: A | Discharge status: Home / Self Care | Payer: Medicare Other | Source: Ambulatory Visit | Attending: Thoracic Surgery (Cardiothoracic Vascular Surgery) | Admitting: Thoracic Surgery (Cardiothoracic Vascular Surgery)

## 2014-10-26 ENCOUNTER — Inpatient Hospital Stay (HOSPITAL_COMMUNITY): Payer: Medicare Other

## 2014-10-26 ENCOUNTER — Inpatient Hospital Stay (HOSPITAL_COMMUNITY): Payer: Medicare Other | Admitting: Anesthesiology

## 2014-10-26 ENCOUNTER — Inpatient Hospital Stay (HOSPITAL_COMMUNITY): Payer: Medicare Other | Admitting: Vascular Surgery

## 2014-10-26 ENCOUNTER — Encounter (HOSPITAL_COMMUNITY)
Admission: RE | Disposition: A | Discharge status: Home / Self Care | Payer: Self-pay | Source: Ambulatory Visit | Attending: Thoracic Surgery (Cardiothoracic Vascular Surgery)

## 2014-10-26 DIAGNOSIS — Z794 Long term (current) use of insulin: Secondary | ICD-10-CM

## 2014-10-26 DIAGNOSIS — D62 Acute posthemorrhagic anemia: Secondary | ICD-10-CM | POA: Diagnosis not present

## 2014-10-26 DIAGNOSIS — E1165 Type 2 diabetes mellitus with hyperglycemia: Secondary | ICD-10-CM | POA: Diagnosis present

## 2014-10-26 DIAGNOSIS — Z923 Personal history of irradiation: Secondary | ICD-10-CM | POA: Diagnosis not present

## 2014-10-26 DIAGNOSIS — G40909 Epilepsy, unspecified, not intractable, without status epilepticus: Secondary | ICD-10-CM | POA: Diagnosis present

## 2014-10-26 DIAGNOSIS — Z87891 Personal history of nicotine dependence: Secondary | ICD-10-CM

## 2014-10-26 DIAGNOSIS — Z7951 Long term (current) use of inhaled steroids: Secondary | ICD-10-CM | POA: Diagnosis not present

## 2014-10-26 DIAGNOSIS — Z7982 Long term (current) use of aspirin: Secondary | ICD-10-CM

## 2014-10-26 DIAGNOSIS — Z933 Colostomy status: Secondary | ICD-10-CM | POA: Diagnosis not present

## 2014-10-26 DIAGNOSIS — Z9689 Presence of other specified functional implants: Secondary | ICD-10-CM

## 2014-10-26 DIAGNOSIS — I129 Hypertensive chronic kidney disease with stage 1 through stage 4 chronic kidney disease, or unspecified chronic kidney disease: Secondary | ICD-10-CM | POA: Diagnosis present

## 2014-10-26 DIAGNOSIS — E875 Hyperkalemia: Secondary | ICD-10-CM | POA: Diagnosis not present

## 2014-10-26 DIAGNOSIS — N183 Chronic kidney disease, stage 3 (moderate): Secondary | ICD-10-CM | POA: Diagnosis not present

## 2014-10-26 DIAGNOSIS — Z85048 Personal history of other malignant neoplasm of rectum, rectosigmoid junction, and anus: Secondary | ICD-10-CM | POA: Diagnosis not present

## 2014-10-26 DIAGNOSIS — Z79899 Other long term (current) drug therapy: Secondary | ICD-10-CM

## 2014-10-26 DIAGNOSIS — Z9221 Personal history of antineoplastic chemotherapy: Secondary | ICD-10-CM

## 2014-10-26 DIAGNOSIS — E785 Hyperlipidemia, unspecified: Secondary | ICD-10-CM | POA: Diagnosis not present

## 2014-10-26 DIAGNOSIS — C7802 Secondary malignant neoplasm of left lung: Principal | ICD-10-CM | POA: Diagnosis present

## 2014-10-26 DIAGNOSIS — E1122 Type 2 diabetes mellitus with diabetic chronic kidney disease: Secondary | ICD-10-CM | POA: Diagnosis not present

## 2014-10-26 DIAGNOSIS — F79 Unspecified intellectual disabilities: Secondary | ICD-10-CM | POA: Diagnosis not present

## 2014-10-26 DIAGNOSIS — R911 Solitary pulmonary nodule: Secondary | ICD-10-CM | POA: Diagnosis present

## 2014-10-26 HISTORY — PX: SEGMENTECOMY: SHX5076

## 2014-10-26 HISTORY — PX: VIDEO ASSISTED THORACOSCOPY: SHX5073

## 2014-10-26 HISTORY — PX: LYMPH NODE DISSECTION: SHX5087

## 2014-10-26 LAB — GLUCOSE, CAPILLARY
GLUCOSE-CAPILLARY: 93 mg/dL (ref 65–99)
Glucose-Capillary: 64 mg/dL — ABNORMAL LOW (ref 65–99)
Glucose-Capillary: 65 mg/dL (ref 65–99)
Glucose-Capillary: 61 mg/dL — ABNORMAL LOW (ref 65–99)
Glucose-Capillary: 163 mg/dL — ABNORMAL HIGH (ref 65–99)

## 2014-10-26 SURGERY — VIDEO ASSISTED THORACOSCOPY
Anesthesia: General | Site: Chest | Laterality: Left

## 2014-10-26 MED ORDER — BUPIVACAINE HCL (PF) 0.5 % IJ SOLN
INTRAMUSCULAR | Status: AC
  Filled 2014-10-26: qty 10

## 2014-10-26 MED ORDER — BUPIVACAINE HCL (PF) 0.5 % IJ SOLN
INTRAMUSCULAR | Status: DC | PRN
  Administered 2014-10-26: 10 mL

## 2014-10-26 MED ORDER — BUPIVACAINE 0.5 % ON-Q PUMP SINGLE CATH 400 ML
400.0000 mL | INJECTION | Status: DC
  Administered 2014-10-26: 400 mL
  Filled 2014-10-26: qty 400

## 2014-10-26 MED ORDER — GLYCOPYRROLATE 0.2 MG/ML IJ SOLN
INTRAMUSCULAR | Status: DC | PRN
  Administered 2014-10-26: .4 mg via INTRAVENOUS

## 2014-10-26 MED ORDER — OMEGA-3 FATTY ACIDS 1000 MG PO CAPS
1.0000 g | ORAL_CAPSULE | Freq: Two times a day (BID) | ORAL | Status: DC
  Administered 2014-10-27 – 2014-10-31 (×9): 1 g via ORAL
  Filled 2014-10-26 (×15): qty 1

## 2014-10-26 MED ORDER — SENNOSIDES-DOCUSATE SODIUM 8.6-50 MG PO TABS
1.0000 | ORAL_TABLET | Freq: Every day | ORAL | Status: DC
  Administered 2014-10-26 – 2014-10-28 (×2): 1 via ORAL
  Filled 2014-10-26 (×6): qty 1

## 2014-10-26 MED ORDER — FENTANYL CITRATE (PF) 250 MCG/5ML IJ SOLN
INTRAMUSCULAR | Status: AC
  Filled 2014-10-26: qty 5

## 2014-10-26 MED ORDER — MIDAZOLAM HCL 5 MG/5ML IJ SOLN
INTRAMUSCULAR | Status: DC | PRN
  Administered 2014-10-26: 2 mg via INTRAVENOUS

## 2014-10-26 MED ORDER — FENTANYL CITRATE (PF) 100 MCG/2ML IJ SOLN
INTRAMUSCULAR | Status: AC
  Filled 2014-10-26: qty 2

## 2014-10-26 MED ORDER — NEOSTIGMINE METHYLSULFATE 10 MG/10ML IV SOLN
INTRAVENOUS | Status: DC | PRN
  Administered 2014-10-26: 3 mg via INTRAVENOUS

## 2014-10-26 MED ORDER — 0.9 % SODIUM CHLORIDE (POUR BTL) OPTIME
TOPICAL | Status: DC | PRN
  Administered 2014-10-26: 2000 mL

## 2014-10-26 MED ORDER — ONDANSETRON HCL 4 MG/2ML IJ SOLN
4.0000 mg | Freq: Four times a day (QID) | INTRAMUSCULAR | Status: DC | PRN
  Administered 2014-10-26: 4 mg via INTRAVENOUS
  Filled 2014-10-26: qty 2

## 2014-10-26 MED ORDER — ACETAMINOPHEN 500 MG PO TABS
1000.0000 mg | ORAL_TABLET | Freq: Four times a day (QID) | ORAL | Status: DC
  Administered 2014-10-27 – 2014-10-29 (×8): 1000 mg via ORAL
  Filled 2014-10-26 (×14): qty 2

## 2014-10-26 MED ORDER — DEXTROSE-NACL 5-0.9 % IV SOLN
INTRAVENOUS | Status: DC
  Administered 2014-10-26 – 2014-10-28 (×3): via INTRAVENOUS

## 2014-10-26 MED ORDER — HYDROMORPHONE HCL 1 MG/ML IJ SOLN
INTRAMUSCULAR | Status: AC
  Administered 2014-10-26: 0.5 mg via INTRAVENOUS
  Filled 2014-10-26: qty 2

## 2014-10-26 MED ORDER — PROMETHAZINE HCL 25 MG/ML IJ SOLN
6.2500 mg | INTRAMUSCULAR | Status: DC | PRN
Start: 2014-10-26 — End: 2014-10-26

## 2014-10-26 MED ORDER — MOMETASONE FURO-FORMOTEROL FUM 100-5 MCG/ACT IN AERO
2.0000 | INHALATION_SPRAY | Freq: Two times a day (BID) | RESPIRATORY_TRACT | Status: DC
  Administered 2014-10-26 – 2014-10-31 (×9): 2 via RESPIRATORY_TRACT
  Filled 2014-10-26 (×2): qty 8.8

## 2014-10-26 MED ORDER — HYDROMORPHONE HCL 1 MG/ML IJ SOLN
INTRAMUSCULAR | Status: AC
  Filled 2014-10-26: qty 1

## 2014-10-26 MED ORDER — PROPOFOL 10 MG/ML IV BOLUS
INTRAVENOUS | Status: AC
  Filled 2014-10-26: qty 20

## 2014-10-26 MED ORDER — OXYCODONE HCL 5 MG PO TABS
5.0000 mg | ORAL_TABLET | ORAL | Status: DC | PRN
Start: 2014-10-26 — End: 2014-10-31
  Administered 2014-10-28 – 2014-10-31 (×2): 10 mg via ORAL
  Filled 2014-10-26 (×2): qty 2

## 2014-10-26 MED ORDER — FENTANYL CITRATE (PF) 100 MCG/2ML IJ SOLN
25.0000 ug | INTRAMUSCULAR | Status: DC | PRN
Start: 2014-10-26 — End: 2014-10-31

## 2014-10-26 MED ORDER — DEXTROSE 50 % IV SOLN
INTRAVENOUS | Status: AC
  Filled 2014-10-26: qty 50

## 2014-10-26 MED ORDER — INSULIN ASPART 100 UNIT/ML ~~LOC~~ SOLN
0.0000 [IU] | SUBCUTANEOUS | Status: DC
  Administered 2014-10-26: 4 [IU] via SUBCUTANEOUS
  Administered 2014-10-27 (×2): 2 [IU] via SUBCUTANEOUS
  Administered 2014-10-27: 4 [IU] via SUBCUTANEOUS

## 2014-10-26 MED ORDER — PRAVASTATIN SODIUM 40 MG PO TABS
40.0000 mg | ORAL_TABLET | Freq: Every evening | ORAL | Status: DC
  Administered 2014-10-27 – 2014-10-30 (×4): 40 mg via ORAL
  Filled 2014-10-26 (×5): qty 1

## 2014-10-26 MED ORDER — ROCURONIUM BROMIDE 100 MG/10ML IV SOLN
INTRAVENOUS | Status: DC | PRN
  Administered 2014-10-26: 20 mg via INTRAVENOUS
  Administered 2014-10-26: 50 mg via INTRAVENOUS

## 2014-10-26 MED ORDER — ASPIRIN EC 81 MG PO TBEC
81.0000 mg | DELAYED_RELEASE_TABLET | Freq: Every day | ORAL | Status: DC
  Administered 2014-10-27 – 2014-10-31 (×5): 81 mg via ORAL
  Filled 2014-10-26 (×7): qty 1

## 2014-10-26 MED ORDER — MIDAZOLAM HCL 2 MG/2ML IJ SOLN
INTRAMUSCULAR | Status: AC
  Filled 2014-10-26: qty 2

## 2014-10-26 MED ORDER — CETYLPYRIDINIUM CHLORIDE 0.05 % MT LIQD
7.0000 mL | Freq: Two times a day (BID) | OROMUCOSAL | Status: DC
  Administered 2014-10-27: 7 mL via OROMUCOSAL

## 2014-10-26 MED ORDER — LIDOCAINE HCL (CARDIAC) 20 MG/ML IV SOLN
INTRAVENOUS | Status: DC | PRN
  Administered 2014-10-26: 100 mg via INTRAVENOUS

## 2014-10-26 MED ORDER — POTASSIUM CHLORIDE 10 MEQ/50ML IV SOLN
10.0000 meq | Freq: Every day | INTRAVENOUS | Status: DC | PRN
  Filled 2014-10-26: qty 50

## 2014-10-26 MED ORDER — FERROUS SULFATE 325 (65 FE) MG PO TABS
325.0000 mg | ORAL_TABLET | Freq: Two times a day (BID) | ORAL | Status: DC
  Administered 2014-10-27 – 2014-10-31 (×9): 325 mg via ORAL
  Filled 2014-10-26 (×12): qty 1

## 2014-10-26 MED ORDER — TRAMADOL HCL 50 MG PO TABS
50.0000 mg | ORAL_TABLET | Freq: Four times a day (QID) | ORAL | Status: DC | PRN
  Administered 2014-10-27: 100 mg via ORAL
  Filled 2014-10-26: qty 2

## 2014-10-26 MED ORDER — ONDANSETRON HCL 4 MG/2ML IJ SOLN
INTRAMUSCULAR | Status: DC | PRN
  Administered 2014-10-26: 4 mg via INTRAVENOUS

## 2014-10-26 MED ORDER — PHENYLEPHRINE HCL 10 MG/ML IJ SOLN
INTRAMUSCULAR | Status: DC | PRN
Start: 2014-10-26 — End: 2014-10-26
  Administered 2014-10-26: 80 ug via INTRAVENOUS

## 2014-10-26 MED ORDER — SALINE SPRAY 0.65 % NA SOLN
2.0000 | Freq: Four times a day (QID) | NASAL | Status: DC | PRN
  Filled 2014-10-26: qty 44

## 2014-10-26 MED ORDER — PROPOFOL 10 MG/ML IV BOLUS
INTRAVENOUS | Status: DC | PRN
  Administered 2014-10-26: 60 mg via INTRAVENOUS
  Administered 2014-10-26: 140 mg via INTRAVENOUS

## 2014-10-26 MED ORDER — PANTOPRAZOLE SODIUM 40 MG PO TBEC
40.0000 mg | DELAYED_RELEASE_TABLET | Freq: Every morning | ORAL | Status: DC
  Administered 2014-10-27 – 2014-10-31 (×5): 40 mg via ORAL
  Filled 2014-10-26 (×4): qty 1

## 2014-10-26 MED ORDER — HYDROMORPHONE HCL 1 MG/ML IJ SOLN
0.2500 mg | INTRAMUSCULAR | Status: DC | PRN
  Administered 2014-10-26 (×6): 0.5 mg via INTRAVENOUS

## 2014-10-26 MED ORDER — AMLODIPINE BESYLATE 5 MG PO TABS
5.0000 mg | ORAL_TABLET | Freq: Every morning | ORAL | Status: DC
  Administered 2014-10-27 – 2014-10-29 (×3): 5 mg via ORAL
  Filled 2014-10-26 (×4): qty 1

## 2014-10-26 MED ORDER — FENTANYL CITRATE (PF) 100 MCG/2ML IJ SOLN
INTRAMUSCULAR | Status: DC | PRN
  Administered 2014-10-26: 100 ug via INTRAVENOUS
  Administered 2014-10-26 (×3): 50 ug via INTRAVENOUS

## 2014-10-26 MED ORDER — MIDAZOLAM HCL 2 MG/2ML IJ SOLN
INTRAMUSCULAR | Status: AC
  Filled 2014-10-26: qty 4

## 2014-10-26 MED ORDER — MONTELUKAST SODIUM 10 MG PO TABS
10.0000 mg | ORAL_TABLET | Freq: Every day | ORAL | Status: DC
  Administered 2014-10-27 – 2014-10-30 (×4): 10 mg via ORAL
  Filled 2014-10-26 (×6): qty 1

## 2014-10-26 MED ORDER — LACTATED RINGERS IV SOLN: INTRAVENOUS | Status: DC

## 2014-10-26 MED ORDER — VECURONIUM BROMIDE 10 MG IV SOLR
INTRAVENOUS | Status: DC | PRN
  Administered 2014-10-26 (×2): 5000 ug via INTRAVENOUS

## 2014-10-26 MED ORDER — CEFUROXIME SODIUM 1.5 G IJ SOLR
1.5000 g | INTRAMUSCULAR | Status: AC
  Administered 2014-10-26: 1.5 g via INTRAVENOUS
  Filled 2014-10-26: qty 1.5

## 2014-10-26 MED ORDER — METOPROLOL SUCCINATE ER 100 MG PO TB24
100.0000 mg | ORAL_TABLET | Freq: Every day | ORAL | Status: DC
  Filled 2014-10-26: qty 1

## 2014-10-26 MED ORDER — LACTATED RINGERS IV SOLN
INTRAVENOUS | Status: DC | PRN
  Administered 2014-10-26: 11:00:00 via INTRAVENOUS

## 2014-10-26 MED ORDER — DEXTROSE 5 % IV SOLN
1.5000 g | Freq: Two times a day (BID) | INTRAVENOUS | Status: AC
  Administered 2014-10-26 – 2014-10-27 (×2): 1.5 g via INTRAVENOUS
  Filled 2014-10-26 (×2): qty 1.5

## 2014-10-26 MED ORDER — PHENYTOIN SODIUM EXTENDED 100 MG PO CAPS
100.0000 mg | ORAL_CAPSULE | Freq: Two times a day (BID) | ORAL | Status: DC
  Administered 2014-10-27 – 2014-10-31 (×9): 100 mg via ORAL
  Filled 2014-10-26 (×10): qty 1

## 2014-10-26 MED ORDER — HYDROMORPHONE HCL 1 MG/ML IJ SOLN
0.5000 mg | INTRAMUSCULAR | Status: DC | PRN
  Administered 2014-10-26 (×2): 0.5 mg via INTRAVENOUS

## 2014-10-26 MED ORDER — ACETAMINOPHEN 160 MG/5ML PO SOLN
1000.0000 mg | Freq: Four times a day (QID) | ORAL | Status: DC
  Filled 2014-10-26 (×14): qty 40

## 2014-10-26 MED ORDER — BISACODYL 5 MG PO TBEC
10.0000 mg | DELAYED_RELEASE_TABLET | Freq: Every day | ORAL | Status: DC
  Administered 2014-10-26 – 2014-10-29 (×4): 10 mg via ORAL
  Filled 2014-10-26 (×5): qty 2

## 2014-10-26 SURGICAL SUPPLY — 86 items
ADH SKN CLS APL DERMABOND .7 (GAUZE/BANDAGES/DRESSINGS) ×1
APPLIER CLIP ROT 10 11.4 M/L (STAPLE)
APR CLP MED LRG 11.4X10 (STAPLE)
BAG SPEC RTRVL LRG 6X4 10 (ENDOMECHANICALS) ×1
CANISTER SUCTION 2500CC (MISCELLANEOUS) ×3 IMPLANT
CATH KIT ON Q 5IN SLV (PAIN MANAGEMENT) ×2 IMPLANT
CATH THORACIC 28FR (CATHETERS) ×2 IMPLANT
CATH THORACIC 28FR RT ANG (CATHETERS) IMPLANT
CATH THORACIC 36FR (CATHETERS) IMPLANT
CATH THORACIC 36FR RT ANG (CATHETERS) IMPLANT
CLIP APPLIE ROT 10 11.4 M/L (STAPLE) IMPLANT
CLIP TI MEDIUM 6 (CLIP) ×2 IMPLANT
CONN Y 3/8X3/8X3/8  BEN (MISCELLANEOUS)
CONN Y 3/8X3/8X3/8 BEN (MISCELLANEOUS) ×1 IMPLANT
CONT SPEC 4OZ CLIKSEAL STRL BL (MISCELLANEOUS) ×14 IMPLANT
COVER SURGICAL LIGHT HANDLE (MISCELLANEOUS) ×3 IMPLANT
CUTTER ECHEON FLEX ENDO 45 340 (ENDOMECHANICALS) ×2 IMPLANT
DERMABOND ADVANCED (GAUZE/BANDAGES/DRESSINGS) ×2
DERMABOND ADVANCED .7 DNX12 (GAUZE/BANDAGES/DRESSINGS) IMPLANT
DRAIN CHANNEL 28F RND 3/8 FF (WOUND CARE) IMPLANT
DRAIN CHANNEL 32F RND 10.7 FF (WOUND CARE) IMPLANT
DRAPE LAPAROSCOPIC ABDOMINAL (DRAPES) ×3 IMPLANT
DRAPE WARM FLUID 44X44 (DRAPE) ×3 IMPLANT
ELECT REM PT RETURN 9FT ADLT (ELECTROSURGICAL) ×3
ELECTRODE REM PT RTRN 9FT ADLT (ELECTROSURGICAL) ×1 IMPLANT
GAUZE SPONGE 4X4 12PLY STRL (GAUZE/BANDAGES/DRESSINGS) ×3 IMPLANT
GLOVE BIO SURGEON STRL SZ7.5 (GLOVE) ×2 IMPLANT
GLOVE BIOGEL PI IND STRL 6.5 (GLOVE) IMPLANT
GLOVE BIOGEL PI INDICATOR 6.5 (GLOVE) ×4
GLOVE SS BIOGEL STRL SZ 6.5 (GLOVE) IMPLANT
GLOVE SUPERSENSE BIOGEL SZ 6.5 (GLOVE) ×4
GLOVE SURG SIGNA 7.5 PF LTX (GLOVE) ×6 IMPLANT
GOWN STRL REUS W/ TWL LRG LVL3 (GOWN DISPOSABLE) ×2 IMPLANT
GOWN STRL REUS W/ TWL XL LVL3 (GOWN DISPOSABLE) ×1 IMPLANT
GOWN STRL REUS W/TWL LRG LVL3 (GOWN DISPOSABLE) ×6
GOWN STRL REUS W/TWL XL LVL3 (GOWN DISPOSABLE) ×3
HEMOSTAT SURGICEL 2X14 (HEMOSTASIS) IMPLANT
KIT BASIN OR (CUSTOM PROCEDURE TRAY) ×3 IMPLANT
KIT ROOM TURNOVER OR (KITS) ×3 IMPLANT
KIT SUCTION CATH 14FR (SUCTIONS) ×3 IMPLANT
NS IRRIG 1000ML POUR BTL (IV SOLUTION) ×6 IMPLANT
PACK CHEST (CUSTOM PROCEDURE TRAY) ×3 IMPLANT
PAD ARMBOARD 7.5X6 YLW CONV (MISCELLANEOUS) ×6 IMPLANT
POUCH ENDO CATCH II 15MM (MISCELLANEOUS) IMPLANT
POUCH SPECIMEN RETRIEVAL 10MM (ENDOMECHANICALS) ×2 IMPLANT
RELOAD GOLD ECHELON 45 (STAPLE) ×4 IMPLANT
RELOAD GREEN ECHELON 45 (STAPLE) ×2 IMPLANT
RELOAD STAPLE 35X2.5 WHT THIN (STAPLE) IMPLANT
SEALANT PROGEL (MISCELLANEOUS) IMPLANT
SEALANT SURG COSEAL 4ML (VASCULAR PRODUCTS) IMPLANT
SEALANT SURG COSEAL 8ML (VASCULAR PRODUCTS) IMPLANT
SOLUTION ANTI FOG 6CC (MISCELLANEOUS) ×3 IMPLANT
SPECIMEN JAR MEDIUM (MISCELLANEOUS) ×1 IMPLANT
SPONGE GAUZE 4X4 12PLY STER LF (GAUZE/BANDAGES/DRESSINGS) ×2 IMPLANT
SPONGE INTESTINAL PEANUT (DISPOSABLE) ×2 IMPLANT
STAPLE RELOAD 2.5MM WHITE (STAPLE) ×6 IMPLANT
STAPLER VASCULAR ECHELON 35 (CUTTER) ×2 IMPLANT
SUT PROLENE 4 0 RB 1 (SUTURE)
SUT PROLENE 4-0 RB1 .5 CRCL 36 (SUTURE) IMPLANT
SUT SILK  1 MH (SUTURE) ×4
SUT SILK 1 MH (SUTURE) ×2 IMPLANT
SUT SILK 2 0SH CR/8 30 (SUTURE) IMPLANT
SUT SILK 3 0SH CR/8 30 (SUTURE) IMPLANT
SUT VIC AB 1 CTX 36 (SUTURE) ×3
SUT VIC AB 1 CTX36XBRD ANBCTR (SUTURE) IMPLANT
SUT VIC AB 2-0 CT1 27 (SUTURE) ×3
SUT VIC AB 2-0 CT1 TAPERPNT 27 (SUTURE) IMPLANT
SUT VIC AB 2-0 CTX 36 (SUTURE) IMPLANT
SUT VIC AB 2-0 UR6 27 (SUTURE) IMPLANT
SUT VIC AB 3-0 MH 27 (SUTURE) IMPLANT
SUT VIC AB 3-0 X1 27 (SUTURE) ×3 IMPLANT
SUT VICRYL 2 TP 1 (SUTURE) IMPLANT
SWAB COLLECTION DEVICE MRSA (MISCELLANEOUS) IMPLANT
SYSTEM SAHARA CHEST DRAIN ATS (WOUND CARE) ×3 IMPLANT
TAPE CLOTH 4X10 WHT NS (GAUZE/BANDAGES/DRESSINGS) ×3 IMPLANT
TAPE CLOTH SURG 4X10 WHT LF (GAUZE/BANDAGES/DRESSINGS) ×2 IMPLANT
TIP APPLICATOR SPRAY EXTEND 16 (VASCULAR PRODUCTS) IMPLANT
TOWEL OR 17X24 6PK STRL BLUE (TOWEL DISPOSABLE) ×3 IMPLANT
TOWEL OR 17X26 10 PK STRL BLUE (TOWEL DISPOSABLE) ×6 IMPLANT
TRAP SPECIMEN MUCOUS 40CC (MISCELLANEOUS) IMPLANT
TRAY FOLEY CATH 16FRSI W/METER (SET/KITS/TRAYS/PACK) ×3 IMPLANT
TROCAR XCEL BLADELESS 5X75MML (TROCAR) ×3 IMPLANT
TROCAR XCEL NON-BLD 5MMX100MML (ENDOMECHANICALS) IMPLANT
TUBE ANAEROBIC SPECIMEN COL (MISCELLANEOUS) IMPLANT
TUNNELER SHEATH ON-Q 11GX8 DSP (PAIN MANAGEMENT) ×2 IMPLANT
WATER STERILE IRR 1000ML POUR (IV SOLUTION) ×6 IMPLANT

## 2014-10-26 NOTE — Interval H&P Note (Signed)
History and Physical Interval Note:  10/26/2014 10:20 AM  [name]  has presented today for surgery, with the diagnosis of LLL NODULE  The various methods of treatment have been discussed with the patient and family. After consideration of risks, benefits and other options for treatment, the patient has consented to  Procedure(s): VIDEO ASSISTED THORACOSCOPY (Left) LEFT LOWER LOBE SUPERIOR SEGMENTECTOMY (Left) as a surgical intervention .  The patient's history has been reviewed, patient examined, no change in status, stable for surgery.  I have reviewed the patient's chart and labs.  Questions were answered to the patient's satisfaction.     [name]

## 2014-10-26 NOTE — Anesthesia Preprocedure Evaluation (Addendum)
Anesthesia Evaluation  Patient identified by MRN, date of birth, ID band Patient awake    Reviewed: Allergy & Precautions, NPO status , Patient's Chart, lab work & pertinent test results, reviewed documented beta blocker date and time   Airway Mallampati: III  TM Distance: >3 FB Neck ROM: Full    Dental   Pulmonary asthma , former smoker,  breath sounds clear to auscultation        Cardiovascular hypertension, Pt. on medications and Pt. on home beta blockers Rhythm:Regular Rate:Normal     Neuro/Psych Seizures -,  Mental retardation    GI/Hepatic GERD-  ,  Endo/Other  diabetes, Type 2, Oral Hypoglycemic AgentsMorbid obesity  Renal/GU CRFRenal disease     Musculoskeletal  (+) Arthritis -,   Abdominal   Peds  Hematology  (+) anemia ,   Anesthesia Other Findings   Reproductive/Obstetrics                            Anesthesia Physical Anesthesia Plan  ASA: III  Anesthesia Plan: General   Post-op Pain Management:    Induction: Intravenous  Airway Management Planned: Double Lumen EBT  Additional Equipment: Arterial line and CVP  Intra-op Plan:   Post-operative Plan: Extubation in OR  Informed Consent: I have reviewed the patients History and Physical, chart, labs and discussed the procedure including the risks, benefits and alternatives for the proposed anesthesia with the patient or authorized representative who has indicated his/her understanding and acceptance.   Dental advisory given  Plan Discussed with: CRNA  Anesthesia Plan Comments:        Anesthesia Quick Evaluation

## 2014-10-26 NOTE — Brief Op Note (Addendum)
      [address]       [place] [address]             [phone]   10/26/2014  1:40 PM  PATIENT:  [name]  64 y.o. female  PRE-OPERATIVE DIAGNOSIS:  LEFT LOWER LOBE NODULE  POST-OPERATIVE DIAGNOSIS: ADENOCARCINOMA LEFT LOWER LOBE   PROCEDURE:  Procedure(s): LEFT VIDEO ASSISTED THORACOSCOPY LEFT LOWER LOBE SUPERIOR SEGMENTECTOMY LYMPH NODE SAMPLING ON-Q LOCAL ANESTHETIC CATHETER PLACEMENT  SURGEON:  Surgeon(s): [name], MD  PHYSICIAN ASSISTANT: WAYNE GOLD PA-C  ANESTHESIA:   general  SPECIMEN:  Source of Specimen:  LLL SUPERIOR SEGMENT FROZEN: ADENOCARCINOMA  DISPOSITION OF SPECIMEN:  Pathology  DRAINS: 1 28F Chest Tube(s) in the LEFT HEMITHORAX   PATIENT CONDITION:  PACU - hemodynamically stable.  PRE-OPERATIVE WEIGHT: 103kg

## 2014-10-26 NOTE — H&P (View-Only) (Signed)
PCP is [name], MD Referring Provider is Penland, Kelby Fam, MD  Chief Complaint  Patient presents with  . Lung Lesion    Surgical eval on Enlarging pulmonary nodule Left lower lobe, PET Scan 10/07/14, Chest CT 08/17/14, HX of rectal cancer    HPI: [name] is a 64 year old woman with a past medical history significant for rectal cancer (T3, N0), uncontrolled type 2 diabetes, hyperlipidemia, anemia, asthma, seizure disorder, arthritis, and stage III chronic kidney disease. She is mentally challenged and lives in a group home. She is a poor historian and history and review of systems are limited. She is accompanied by a worker from the group home.  She was treated with chemotherapy, radiation, and surgery (APR) for rectal cancer. She has been followed with serial CT scans. She was noted to have a nodule in the superior segment of the left lower lung. On her most recent CT the nodule measured 8 x 10 mm. A PET CT was done which showed the nodule to be 11.5 mm in diameter and was hypermetabolic with an SUV of 7.9.  She has an unknown smoking history. By report she quit smoking years ago but did have some regular smoking, today she says that she only smoked 1 cigarette in her life. She says she gets short of breath with exertion, but can walk up a ramp and down the entire length of the hall at her home without any stopping to rest. She denies wheezing but is on the bronchial dilators. She denies cough and hemoptysis. She denies weight loss and change in appetite.  Past Medical History  Diagnosis Date  . Hypertension   . Diabetes mellitus     years  . Asthma   . Depression   . Mental retardation   . History of recurrent UTIs   . Shortness of breath     with exertion  . Cancer     Rectal   . Arthritis   . Gout   . Seizures     more than 4 yrs since last seizure. UNknown etiology  . Rectal cancer   . Iron deficiency 04/27/2014  . Chronic renal disease, stage 3, moderately  decreased glomerular filtration rate between 30-59 mL/min/1.73 square meter 04/27/2014    Past Surgical History  Procedure Laterality Date  . Abdominal hysterectomy    . Multiple extractions with alveoloplasty N/A 11/23/2012    Procedure: MULTIPLE EXTRACION 1, 2, 4, 5, 6, 7, 8, 9, 10, 11, 12, 13, 14, 17, 18, 20, 23, 24, 25, 26, 28, 29, 32 WITH ALVEOLOPLASTY, REMOVE BILATERAL TORI;  Surgeon: Gae Bon, DDS;  Location: [facility];  Service: Oral Surgery;  Laterality: N/A;  . Colonoscopy with esophagogastroduodenoscopy (egd) N/A 01/14/2013    Procedure: COLONOSCOPY WITH ESOPHAGOGASTRODUODENOSCOPY (EGD);  Surgeon: Rogene Houston, MD;  [facility];  Service: Endoscopy;  Laterality: N/A;  250-moved to 315 Ann to notify pt  . Colonoscopy N/A 02/04/2013    Procedure: COLONOSCOPY;  Surgeon: Rogene Houston, MD;  [facility];  Service: Endoscopy;  Laterality: N/A;  225  . Eus N/A 02/18/2013    Procedure: LOWER ENDOSCOPIC ULTRASOUND (EUS);  Surgeon: Milus Banister, MD;  Location: [facility];  Service: Endoscopy;  Laterality: N/A;  . Flexible sigmoidoscopy N/A 07/26/2013    Procedure: FLEXIBLE SIGMOIDOSCOPY;  Surgeon: Jamesetta So, MD;  [facility];  Service: General;  Laterality: N/A;  . Abdominal perineal bowel resection N/A 07/26/2013    Procedure:  ABDOMINAL PERINEAL RESECTION;  Surgeon: Jamesetta So, MD;  Location: [facility];  Service: General;  Laterality: N/A;  . Supracervical abdominal hysterectomy N/A 07/26/2013    Procedure: HYSTERECTOMY SUPRACERVICAL ABDOMINAL ;  Surgeon: Jamesetta So, MD;  Location: [facility];  Service: General;  Laterality: N/A;  . Salpingoophorectomy Bilateral 07/26/2013    Procedure: SALPINGO OOPHORECTOMY;  Surgeon: Jamesetta So, MD;  [facility];  Service: General;  Laterality: Bilateral;  . Colostomy Left 07/26/2013    Procedure: COLOSTOMY;  Surgeon: Jamesetta So, MD;  Location[facility];  Service: General;  Laterality: Left;  . Colonoscopy  N/A 05/12/2014    Procedure: COLONOSCOPY;  Surgeon: Rogene Houston, MD;  [facility];  Service: Endoscopy;  Laterality: N/A;  1030    No family history on file.  Social History History  Substance Use Topics  . Smoking status: Former Smoker    Types: Cigarettes    Quit date: 07/16/2008  . Smokeless tobacco: Never Used     Comment: unknown when stopped smoking      long time  . Alcohol Use: No    Current Outpatient Prescriptions  Medication Sig Dispense Refill  . amLODipine (NORVASC) 5 MG tablet Take 1 tablet (5 mg total) by mouth daily. 30 tablet 1  . aspirin EC 81 MG tablet Take 81 mg by mouth daily.    . diphenoxylate-atropine (LOMOTIL) 2.5-0.025 MG per tablet TAKE 1 TABLET BY MOUTH EVERY 2 HOURS AS NEEDED FOR DIARRHEA/LOOSE STOOL. 30 tablet 1  . docusate sodium (COLACE) 100 MG capsule Take 100 mg by mouth 2 (two) times daily.    . ferrous sulfate 325 (65 FE) MG tablet TAKE (1) TABLET BY MOUTH TWICE DAILY. 60 tablet 1  . fish oil-omega-3 fatty acids 1000 MG capsule Take 1 g by mouth 2 (two) times daily.    . Fluticasone-Salmeterol (ADVAIR) 250-50 MCG/DOSE AEPB Inhale 1 puff into the lungs every 12 (twelve) hours.    [name] HYDROcodone-acetaminophen (NORCO/VICODIN) 5-325 MG per tablet Take 1 tablet by mouth every 6 (six) hours as needed for moderate pain. For pain not relieved by tramadol    . ibuprofen (ADVIL,MOTRIN) 600 MG tablet Take 600 mg by mouth every 6 (six) hours as needed for moderate pain.     . Insulin Glargine (LANTUS SOLOSTAR) 100 UNIT/ML SOPN Inject 15 Units into the skin at bedtime.     [name] loratadine-pseudoephedrine (LORATADINE-D 24HR) 10-240 MG per 24 hr tablet Take 1 tablet by mouth daily.    . metFORMIN (GLUCOPHAGE) 1000 MG tablet Take 1,000 mg by mouth 2 (two) times daily with a meal.    . metoprolol succinate (TOPROL-XL) 50 MG 24 hr tablet Take 100 mg by mouth at bedtime. Take with or immediately following a meal.    . montelukast (SINGULAIR) 10 MG tablet  Take 10 mg by mouth at bedtime.    . pantoprazole (PROTONIX) 40 MG tablet Take 40 mg by mouth daily.    . phenytoin (DILANTIN) 100 MG ER capsule Take 100 mg by mouth 2 (two) times daily.    . pravastatin (PRAVACHOL) 40 MG tablet Take 40 mg by mouth daily.    . silver sulfADIAZINE (SILVADENE) 1 % cream Apply 1 application topically daily.    . sodium chloride (OCEAN) 0.65 % SOLN nasal spray Place 2 sprays into both nostrils 4 (four) times daily as needed for congestion.    . traMADol (ULTRAM) 50 MG tablet Take by mouth every 6 (six) hours  as needed.    . triamcinolone cream (KENALOG) 0.1 % Apply 1 application topically daily. To legs    . Emollient (CERAVE) LOTN Apply 1 application topically daily. Apply to feet     No current facility-administered medications for this visit.    No Known Allergies  Review of Systems  Constitutional: Negative for fever, chills, appetite change and unexpected weight change.  Respiratory: Positive for shortness of breath (with exertion) and wheezing. Negative for cough.   Cardiovascular: Positive for leg swelling. Negative for chest pain.  Gastrointestinal: Negative for abdominal pain.       S/p APR for rectal cancer  Musculoskeletal: Positive for gait problem.  Neurological: Positive for seizures (none recently).    BP 173/90 mmHg  Pulse 110  Resp 16  Ht '5\' 4"'$  (1.626 m)  Wt 222 lb (100.699 kg)  BMI 38.09 kg/m2  SpO2 97% Physical Exam  Constitutional: No distress.  obese  HENT:  Head: Normocephalic and atraumatic.  Eyes: EOM are normal. No scleral icterus.  Neck: Neck supple.  Cardiovascular: Normal rate, regular rhythm and normal heart sounds.   No murmur heard. Pulmonary/Chest: Effort normal and breath sounds normal. She has no wheezes. She has no rales.  Abdominal: Soft.  ostomy  Musculoskeletal: She exhibits edema (2+ bilaterally).  Lymphadenopathy:    She has no cervical adenopathy.  Neurological: She is alert.  No focal deficit   Skin: Skin is warm and dry.  Vitals reviewed.    Diagnostic Tests: CT CHEST WITHOUT CONTRAST  TECHNIQUE: Multidetector CT imaging of the chest was performed following the standard protocol without IV contrast.  COMPARISON: 05/26/2014. 03/07/2014.  FINDINGS: Mediastinum / Lymph Nodes: There is no axillary lymphadenopathy. No mediastinal or hilar lymphadenopathy. Heart size is normal. No pericardial effusion.  Lungs / Pleura: Superior segment left lower lobe pulmonary nodule measures 9 x 10 mm on today's study which compares to 10 x 8 mm previously. Although stable in the 3 month interval since the most recent comparison study, revealing more remote comparison studies again shows slow progressive enlargement of this nodule.  4 mm right lower lobe pulmonary nodule is unchanged (image 24 series 3).  There is a new focal area of airspace disease in the posterior left upper lobe (see image 20 series 3).  MSK / Soft Tissues: Bone windows reveal no worrisome lytic or sclerotic osseous lesions.  Upper Abdomen: Unremarkable.  IMPRESSION: Interval development of focal airspace disease in the posterior left upper lobe, suggesting pneumonia.  Superior segment left lower lobe pulmonary nodule measures stable since the most recent comparison study, but is again noted to have increased in size when comparing to older studies. Continued close attention will be required as neoplasm remains a concern.  Stable right lower lobe pulmonary nodule.  These results will be called to the ordering clinician or representative by the Radiologist Assistant, and communication documented in the PACS or zVision Dashboard.   Electronically Signed  By: Misty Stanley M.D.  On: 08/17/2014 12:44  NUCLEAR MEDICINE PET SKULL BASE TO THIGH  TECHNIQUE: 11.1 mCi F-18 FDG was injected intravenously. Full-ring PET imaging was performed from the skull base to thigh after the  radiotracer. CT data was obtained and used for attenuation correction and anatomic localization.  FASTING BLOOD GLUCOSE: Value: 82 mg/dl  COMPARISON: PET of 03/05/2013. Chest CTs of 08/17/14 and 05/26/2014.  FINDINGS: NECK  No areas of abnormal hypermetabolism.  CHEST  Hypermetabolism which corresponds to the superior segment left lower lobe pulmonary nodule. This  measures 12 x 11 mm and a S.U.V. max of 7.9, including on image 23 of series 8. This is enlarged when compared to remote prior exams.  A left axillary node measures 7 mm and a S.U.V. max of 1.7. Not pathologic by size criteria and non malignant range activity. Similar size nodes in this area on 03/05/2013.  ABDOMEN/PELVIS  No areas of abnormal hypermetabolism.  SKELETON  Heterogeneous marrow activity is identified throughout. This corresponds to chronic heterogeneous marrow density with areas of sclerosis and lucency. Somewhat more focal area of hypermetabolism involving the left sacrum is without focal CT correlate and measures a S.U.V. max of 5.8.  CT IMAGES PERFORMED FOR ATTENUATION CORRECTION  No cervical adenopathy. Right lower lobe pulmonary nodule which is below the resolution of PET. No lobar consolidation. Bilateral adrenal thickening. Left renal atrophy. Motion degradation within the abdomen and pelvis especially. Descending colostomy with mild redundancy within. Heterogeneous marrow density which has been present back to 2010. Areas of cortical thickening, most apparent within the pelvis.  IMPRESSION: 1. Hypermetabolism corresponding to the superior segment left lower lobe lung nodule. This could represent a primary bronchogenic carcinoma or isolated metastatic lesion. Primary bronchogenic carcinomas slightly favored. 2. No evidence of hypermetabolic metastatic disease. 3. Left axillary node is small and demonstrates low-level non malignant range hypermetabolism. Favor reactive  etiology. 4. Heterogeneous marrow density and metabolism. The CT appearance has been present back to 2010. Favor Paget's disease. An area of more focal hypermetabolism in the left side of the sacrum is nonspecific and without focal CT correlate. Metastatic disease is felt unlikely but cannot be entirely excluded. Recommend attention on follow-up.   Electronically Signed  By: Abigail Miyamoto M.D.  On: 10/07/2014 13:02  I personally reviewed the CT and PET CT and concur with the findings as noted above as relates to the lung nodule.  Impression: 64 year old woman with an unknown smoking history and a history of rectal cancer who has a nodule in the superior segment of the left lower lobe. This nodule has grown over time and is hypermetabolic on PET CT. It is highly suspicious for a neoplasm. This could represent either a new primary bronchogenic carcinoma or metastasis from rectal cancer. It could potentially be infectious or inflammatory, but needs to be considered cancer unless he can be proven otherwise.  I recommended to [name] that we do a left VATS and left lower lobe superior segmentectomy for definitive diagnosis and treatment of the lung nodule. She has limited understanding and we will need to obtain consent from [name] before proceeding with surgery.  She does need pulmonary function testing prior to lung surgery, although I'm not sure how well she will do with that terms of understanding and cooperation.  Plan:  Pulmonary function testing Left VATS, left lower lobe superior segmentectomy once consent obtained  I spent 30 minutes with [name] during this visit, greater than 50% was spent in counseling. [name], MD [facility] [phone]

## 2014-10-26 NOTE — Progress Notes (Signed)
CBG 64 at 0900. Asymptomatic. Dr Ola Spurr Herbie [place]) called by Sharmaine Base and stated no sugar needed at this time as long as pt is asymptomatic. We will assess CBG q hour.

## 2014-10-26 NOTE — Transfer of Care (Signed)
Immediate Anesthesia Transfer of Care Note  Patient: [name]  Procedure(s) Performed: Procedure(s): LEFT VIDEO ASSISTED THORACOSCOPY (Left) LEFT LOWER LOBE SUPERIOR SEGMENTECTOMY (Left) LYMPH NODE DISSECTION (Left)  Patient Location: PACU  Anesthesia Type:General  Level of Consciousness: awake and patient cooperative  Airway & Oxygen Therapy: Patient Spontanous Breathing and Patient connected to face mask oxygen  Post-op Assessment: Report given to RN, Post -op Vital signs reviewed and stable and Patient moving all extremities X 4  Post vital signs: Reviewed and stable  Last Vitals:  Filed Vitals:   10/26/14 1355  BP:   Pulse:   Temp: 36.3 C  Resp:     Complications: No apparent anesthesia complications

## 2014-10-26 NOTE — Anesthesia Procedure Notes (Addendum)
Procedure Name: Intubation Performed by: Mariea Clonts Patient Re-evaluated:Patient Re-evaluated prior to inductionOxygen Delivery Method: Circle system utilized Preoxygenation: Pre-oxygenation with 100% oxygen Intubation Type: IV induction Ventilation: Mask ventilation without difficulty and Oral airway inserted - appropriate to patient size Laryngoscope Size: Mac and 3 Grade View: Grade II Tube type: Oral Endobronchial tube: Double lumen EBT, EBT position confirmed by auscultation, EBT position confirmed by fiberoptic bronchoscope and Left and 37 Fr Number of attempts: 1 Placement Confirmation: ETT inserted through vocal cords under direct vision,  breath sounds checked- equal and bilateral and positive ETCO2 Tube secured with: Tape Dental Injury: Teeth and Oropharynx as per pre-operative assessment    Anesthesia Procedure Note Central line insertion note. Skin prepped and draped in sterile fashion. Patent vessel identified on u/s using linear probe. Needle advanced under live u/s guidance with aspiration of blood upon entry into vessel. Catheter passed easily over finder needle. Wire passed easily through catheter and location confirmed with u/s. Image saved in chart. Introducer catheter advanced over wire, with aspiration of blood through all ports for confirmation. Line sutured and dressing applied. Pt tolerated well with no immediate complications.  Deatra Canter, MD

## 2014-10-27 ENCOUNTER — Inpatient Hospital Stay (HOSPITAL_COMMUNITY): Payer: Medicare Other

## 2014-10-27 ENCOUNTER — Encounter (HOSPITAL_COMMUNITY): Payer: Self-pay | Admitting: Certified Registered Nurse Anesthetist

## 2014-10-27 LAB — BASIC METABOLIC PANEL
ANION GAP: 10 (ref 5–15)
BUN: 27 mg/dL — AB (ref 6–20)
CALCIUM: 7.6 mg/dL — AB (ref 8.9–10.3)
CO2: 25 mmol/L (ref 22–32)
CREATININE: 1.11 mg/dL — AB (ref 0.44–1.00)
Chloride: 99 mmol/L — ABNORMAL LOW (ref 101–111)
GFR calc Af Amer: 60 mL/min — ABNORMAL LOW (ref >60)
GFR calc non Af Amer: 52 mL/min — ABNORMAL LOW (ref >60)
GLUCOSE: 214 mg/dL — AB (ref 65–99)
Potassium: 5.6 mmol/L — ABNORMAL HIGH (ref 3.5–5.1)
SODIUM: 134 mmol/L — AB (ref 135–145)

## 2014-10-27 LAB — BLOOD GAS, ARTERIAL
Acid-base deficit: 0.3 mmol/L (ref 0.0–2.0)
BICARBONATE: 24.2 meq/L — AB (ref 20.0–24.0)
FIO2: 0.24
O2 SAT: 98.6 %
PATIENT TEMPERATURE: 98.6
TCO2: 25.5 mmol/L (ref 0–100)
pCO2 arterial: 42.1 mmHg (ref 35.0–45.0)
pH, Arterial: 7.377 (ref 7.350–7.450)
pO2, Arterial: 125 mmHg — ABNORMAL HIGH (ref 80.0–100.0)

## 2014-10-27 LAB — GLUCOSE, CAPILLARY
GLUCOSE-CAPILLARY: 168 mg/dL — AB (ref 65–99)
GLUCOSE-CAPILLARY: 153 mg/dL — AB (ref 65–99)
Glucose-Capillary: 144 mg/dL — ABNORMAL HIGH (ref 65–99)
Glucose-Capillary: 158 mg/dL — ABNORMAL HIGH (ref 65–99)
Glucose-Capillary: 175 mg/dL — ABNORMAL HIGH (ref 65–99)
Glucose-Capillary: 163 mg/dL — ABNORMAL HIGH (ref 65–99)

## 2014-10-27 LAB — CBC
HCT: 29.1 % — ABNORMAL LOW (ref 36.0–46.0)
HEMOGLOBIN: 9.3 g/dL — AB (ref 12.0–15.0)
MCH: 28.2 pg (ref 26.0–34.0)
MCHC: 32 g/dL (ref 30.0–36.0)
MCV: 88.2 fL (ref 78.0–100.0)
Platelets: 158 10*3/uL (ref 150–400)
RBC: 3.3 MIL/uL — ABNORMAL LOW (ref 3.87–5.11)
RDW: 13.6 % (ref 11.5–15.5)
WBC: 12.2 10*3/uL — ABNORMAL HIGH (ref 4.0–10.5)

## 2014-10-27 LAB — HEMOGLOBIN A1C
HEMOGLOBIN A1C: 6.1 % — AB (ref 4.8–5.6)
MEAN PLASMA GLUCOSE: 128 mg/dL

## 2014-10-27 MED ORDER — BOOST / RESOURCE BREEZE PO LIQD
1.0000 | Freq: Three times a day (TID) | ORAL | Status: DC
  Administered 2014-10-27 – 2014-10-31 (×12): 1 via ORAL

## 2014-10-27 MED ORDER — METOPROLOL SUCCINATE ER 100 MG PO TB24
100.0000 mg | ORAL_TABLET | Freq: Every day | ORAL | Status: DC
  Administered 2014-10-27 – 2014-10-30 (×4): 100 mg via ORAL
  Filled 2014-10-27 (×6): qty 1

## 2014-10-27 MED ORDER — INSULIN ASPART 100 UNIT/ML ~~LOC~~ SOLN
0.0000 [IU] | Freq: Three times a day (TID) | SUBCUTANEOUS | Status: DC
  Administered 2014-10-27 (×2): 3 [IU] via SUBCUTANEOUS
  Administered 2014-10-28: 2 [IU] via SUBCUTANEOUS
  Administered 2014-10-28: 5 [IU] via SUBCUTANEOUS
  Administered 2014-10-28: 2 [IU] via SUBCUTANEOUS
  Administered 2014-10-29 (×2): 3 [IU] via SUBCUTANEOUS
  Administered 2014-10-30: 2 [IU] via SUBCUTANEOUS

## 2014-10-27 MED ORDER — PNEUMOCOCCAL VAC POLYVALENT 25 MCG/0.5ML IJ INJ
0.5000 mL | INJECTION | INTRAMUSCULAR | Status: AC
  Administered 2014-10-29: 0.5 mL via INTRAMUSCULAR
  Filled 2014-10-27 (×2): qty 0.5

## 2014-10-27 NOTE — Progress Notes (Signed)
DC L radial arterial line, no complications. Hemostasis achieved, pressure dressing applied after pressure held for 5 mins. Will continue to monitor.

## 2014-10-27 NOTE — Clinical Social Work Note (Signed)
Clinical Social Work Assessment  Patient Details  Name: [name] MRN: [id number] Date of Birth: 1950-12-19  Date of referral:  10/27/14               Reason for consult:   (Admitted from [place] )                Permission sought to share information with:  Case Manager, [place], Customer service manager Permission granted to share information::  Yes, Verbal Permission Granted  Name::      [name] and [name] )  Agency::   ([place] )  Relationship::   (Guardian and Guardian Supervisior )  Contact Information:   (([phone])  Housing/Transportation Living arrangements for the past 2 months:  [place] of Information:  Guardian Patient Interpreter Needed:  None Criminal Activity/Legal Involvement Pertinent to Current Situation/Hospitalization:  No - Comment as needed Significant Relationships:  Warehouse manager Lives with:  Facility Resident Do you feel safe going back to the place where you live?  Yes Need for family participation in patient care:  Yes (Comment)  Care giving concerns: Patient admitted from family care home, [facility].    Social Worker assessment / plan:  Holiday representative contacted [employer], [name] to confirm that patient was admitted from [facility]. CSW introduced CSW role. Pt's guardian reported pt has been a resident of family care home over the past 8-10 years. Pt's legal guardian is [name] who can be reached at [place] [phone]. CSW presented SNF option in the event pt requires a higher level of care. Pt's guardianship supervisor reported she would prefer for pt to return to family care home once medically stable with home health and RN. CSW will continue to update pt's guardians during hospitalization and for disposition needs. No further concerns reported at this time. CSW will continue to follow pt and pt's family for  continued support and to facilitate pt's discharge needs once medically stable.   Employment status:  Disabled (Comment on whether or not currently receiving Disability) Insurance information:  Managed Care, [place] PT Recommendations:  Not assessed at this time Information / Referral to community resources:   (N/A)  Patient/Family's Response to care:  Pt cognitively impaired. Pt's guardian currently agreeable to pt returning to family care home once medically stable. Guardian pleasant and appreciated contact from social work.  Patient/Family's Understanding of and Emotional Response to Diagnosis, Current Treatment, and Prognosis:  Pt's guardian aware of medical diagnosis and will continue to follow the pt for updates on current treatment and discharge planning. CSW remains available as needed.  Emotional Assessment Appearance:  Appears stated age Attitude/Demeanor/Rapport:  Unable to Assess Affect (typically observed):  Unable to Assess Orientation:  Fluctuating Orientation (Suspected and/or reported Sundowners) Alcohol / Substance use:  Not Applicable Psych involvement (Current and /or in the community):  No (Comment)  Discharge Needs  Concerns to be addressed:  Care Coordination Readmission within the last 30 days:    Current discharge risk:  Cognitively Impaired Barriers to Discharge:  Continued Medical Work up   [employer], MSW, LCSWA [phone] 10/27/2014 12:44 PM

## 2014-10-27 NOTE — Op Note (Signed)
NAMEMICAYLA, BRATHWAITE NO.:  [id number]  MEDICAL RECORD NO.:  [id number]  LOCATION:  3S03C                        FACILITY:  [facility]  PHYSICIAN:  Revonda Standard. Roxan Hockey, M.D.DATE OF BIRTH:  1950/10/30  DATE OF PROCEDURE:  10/26/2014 DATE OF DISCHARGE:                              OPERATIVE REPORT   PREOPERATIVE DIAGNOSIS:  Left lower lobe nodule.  POSTOPERATIVE DIAGNOSIS:  Adenocarcinoma, left lower lobe.  PROCEDURE:   Left video-assisted thoracoscopy, Left lower lobe superior segmentectomy, Mediastinal lymph node sampling, On-Q local anesthetic catheter placement.  SURGEON:  Revonda Standard. Roxan Hockey, M.D.  ASSISTANT:  John Giovanni, PA-C  ANESTHESIA:  General.  FINDINGS:  A 1 cm mass palpable in the superior segment of the left lower lobe, relatively normal-appearing lymph nodes.  Frozen section revealed adenocarcinoma with clear margins.  CLINICAL NOTE:  Ms. Schnitzler is a 64 year old woman with a history of rectal cancer and multiple other medical problems, who also is mentally challenged and lives in a group home.  She has been followed with serial CT scans since she was treated for rectal cancer.  She was noted to have a nodule in the superior segment of the left lung which measured 8 x 10 mm on her most recent CT.  The PET-CT showed the nodule had increased further in size and was hypermetabolic with an SUV of 7.9.  It was unclear whether this was a metastasis or primary lung cancer or noncancerous nodule, but was highly suspicious for metastatic or primary cancer.  She was advised to undergo surgical resection.  She was unable to give consent. After discussion of the risks and benefits her social worker gave consent for the procedure.  DESCRIPTION OF PROCEDURE:  Ms. Dueitt was brought to the preoperative holding area on October 26, 2014.  Anesthesia placed a central line and an arterial blood pressure monitoring line.  She was taken to the operating  room, anesthetized, and intubated with a double-lumen endotracheal tube.  Intravenous antibiotics were administered.  A Foley catheter was placed.  Sequential compressive devices were placed on the calves for DVT prophylaxis.  She was placed in a right lateral decubitus position and the left chest was prepped and draped in usual sterile fashion.  Single lung ventilation of the right lung was initiated. An incision was made in the seventh intercostal space in the midaxillary line.  The chest was entered using a 5 mm port.  The thoracoscope was advanced into the chest.  There was good isolation of the left lung. The fissure was nearly complete.  A working incision was made anterolaterally in the fourth intercostal space.  No rib spreading was performed during the procedure.  The working incision was 5 cm in length.  The left lower lobe was grasped and the nodule was palpable near the top of the superior segment.  Because of the possibility that this could be a primary lung cancer rather than a metastasis, the decision was made to perform a superior segmentectomy for definitive treatment of the nodule.  The inferior ligament was divided with electrocautery.  Two nodes were visible, these both appeared normal, they were removed.  All lymph nodes that were removed were sent for permanent  Pathology as separate specimens.  Next, the pleura overlying the pulmonary artery in the fissure was incised and the pulmonary artery was dissected out.  The basilar segmental and superior segmental branches to the lower lobe were identified.  The pleural reflection was divided at the hilum posteriorly which allowed the superior segmental branch of the lower lobe to be dissected out, encircled, and divided with an endoscopic vascular stapler.  Next, the lung was retracted anteriorly.  The superior vein was dissected out and likewise divided with a vascular stapler.  While dissecting out the superior segmental  bronchus, there was a parenchymal tear in the lung which bled.  The superior segmental bronchus was dissected free.  A stapler was placed across the bronchus and closed.  A test inflation showed good aeration of the basilar segments of the lower lobe as well as the upper lobe.  The stapler was fired, transecting the superior segmental bronchus.  The segmentectomy was completed along the demarcation between the aerated and unaerated portion of the lower lobe with sequential firings of an endoscopic GIA stapler.  The superior segment was removed and sent for frozen section.  While awaiting results of frozen section, the aortopulmonary window was inspected.  There were nodes visible.  The pleura was incised and a relatively large bilobed lymph node was removed.  This was otherwise benign appearing.  No other nodes were evident.  The subcarinal space was difficult to access and no node was removed from that area.  The chest was filled with warm saline.  A test inflation to 30 cm of water revealed no air leakage.  By this point, the frozen section returned showing adenocarcinoma. Both the bronchial margin and stapled margin were clear.  An On-Q local anesthetic catheter was tunneled through a separate stab incision posteriorly and secured at the skin with 3-0 silk suture. It was primed with 5 mL of 0.5% Marcaine.  A 28- French chest tube was placed through the original port incision and secured at the skin with a #1 silk suture.  The left lung was reinflated and there was good aeration.  The utility incision was closed in 3 layers in standard fashion.  The patient was extubated in the operating room and taken to the postanesthetic care unit in good condition.  All sponge, needle, and instrument counts were correct at the end of the procedure.     Revonda Standard Roxan Hockey, M.D.     SCH/MEDQ  D:  10/26/2014  T:  10/27/2014  Job:  [id number]

## 2014-10-27 NOTE — Progress Notes (Addendum)
[facility] DAILY ICU PROGRESS NOTE                   [address]            [place] [address]          [phone]   1 Day Post-Op Procedure(s) (LRB): LEFT VIDEO ASSISTED THORACOSCOPY (Left) LEFT LOWER LOBE SUPERIOR SEGMENTECTOMY (Left) LYMPH NODE DISSECTION (Left)  Total Length of Stay:  LOS: 1 day   Subjective: Says she is  having no pain  Objective: Vital signs in last 24 hours: Temp:  [97.2 F (36.2 C)-98.1 F (36.7 C)] 98 F (36.7 C) (08/11 0405) Pulse Rate:  [56-102] 102 (08/11 0405) Cardiac Rhythm:  [-] Sinus tachycardia (08/11 0405) Resp:  [7-22] 15 (08/11 0405) BP: (107-176)/(51-94) 176/78 mmHg (08/11 0405) SpO2:  [95 %-100 %] 95 % (08/11 0500) Arterial Line BP: (87-176)/(47-114) 135/94 mmHg (08/11 0435) Weight:  [229 lb (103.874 kg)-230 lb 9.6 oz (104.6 kg)] 230 lb 9.6 oz (104.6 kg) (08/10 2054)  Filed Weights   10/26/14 0857 10/26/14 2054  Weight: 229 lb (103.874 kg) 230 lb 9.6 oz (104.6 kg)    Weight change:    Hemodynamic parameters for last 24 hours:    Intake/Output from previous day: 08/10 0701 - 08/11 0700 In: 2284.2 [P.O.:480; I.V.:1754.2; IV Piggyback:50] Out: 8182 [Urine:1625; Chest Tube:170]  Intake/Output this shift:    Current Meds: Scheduled Meds: . acetaminophen  1,000 mg Oral 4 times per day   Or  . acetaminophen (TYLENOL) oral liquid 160 mg/5 mL  1,000 mg Oral 4 times per day  . amLODipine  5 mg Oral q morning - 10a  . antiseptic oral rinse  7 mL Mouth Rinse BID  . aspirin EC  81 mg Oral Daily  . bisacodyl  10 mg Oral Daily  . cefUROXime (ZINACEF)  IV  1.5 g Intravenous Q12H  . feeding supplement  1 Container Oral TID BM  . ferrous sulfate  325 mg Oral BID WC  . fish oil-omega-3 fatty acids  1 g Oral BID  . insulin aspart  0-24 Units Subcutaneous 6 times per day  . metoprolol succinate  100 mg Oral QHS  . mometasone-formoterol  2 puff Inhalation BID  . montelukast  10 mg Oral QHS  . pantoprazole  40 mg Oral q  morning - 10a  . phenytoin  100 mg Oral BID  . pravastatin  40 mg Oral QPM  . senna-docusate  1 tablet Oral QHS   Continuous Infusions: . dextrose 5 % and 0.9% NaCl 125 mL/hr at 10/27/14 0702   PRN Meds:.fentaNYL (SUBLIMAZE) injection, ondansetron (ZOFRAN) IV, oxyCODONE, potassium chloride, sodium chloride, traMADol  General appearance: alert, cooperative and no distress Heart: regular rate and rhythm Lungs: clear to auscultation bilaterally Abdomen: benign Extremities: pas in place Wound: dressings intact  Lab Results: CBC: Recent Labs  10/24/14 1548 10/27/14 0430  WBC 4.4 12.2*  HGB 10.3* 9.3*  HCT 32.6* 29.1*  PLT 172 158   BMET:  Recent Labs  10/24/14 1548 10/27/14 0430  NA 137 134*  K 4.7 5.6*  CL 102 99*  CO2 23 25  GLUCOSE 102* 214*  BUN 27* 27*  CREATININE 1.07* 1.11*  CALCIUM 8.5* 7.6*    PT/INR:  Recent Labs  10/24/14 1548  LABPROT 13.8  INR 1.04   Radiology: Dg Chest Port 1 View  10/26/2014   CLINICAL DATA:  Status post left upper lobe superior segmentectomy.  EXAM: PORTABLE  CHEST - 1 VIEW  COMPARISON:  10/24/2014  FINDINGS: There are low lung volumes limiting the exam. E nodule seen on the prior study is no longer evident consistent with it being surgically removed. There is opacity projecting superiorly from the right hilum consistent with atelectasis. There is mild basilar atelectasis. There is no pulmonary edema.  There is no convincing pneumothorax on this semi-erect study.  Cardiac silhouette is normal in size.  Left chest tube tip projects along the lateral left mid to upper hemi thorax pleural space appear left internal jugular central venous catheter has its tip in the superior vena cava near the junction of the left brachiocephalic vein.  IMPRESSION: 1. Low lung volumes and areas of atelectasis, but no overt pulmonary edema. 2. No pneumothorax appreciated. 3. Well-positioned left chest tube. Left internal jugular central venous line with its tip  near the junction of the left brachiocephalic and superior vena cava.   Electronically Signed   By: Lajean Manes M.D.   On: 10/26/2014 14:18     Assessment/Plan: S/P Procedure(s) (LRB): LEFT VIDEO ASSISTED THORACOSCOPY (Left) LEFT LOWER LOBE SUPERIOR SEGMENTECTOMY (Left) LYMPH NODE DISSECTION (Left)  1 has had some htn, will give norvasc and toprol this am 2 a little hyperkalemic- monitor, decrease IVF 3 chest tube with minor drainage, no air leak, poss d/c soon- H2O seal for now 4 d/c aline, foley 5 Acute blood loss anemia , monitor. Minor leukocytosis 6 Renal fxn normal 7 routine pulm toilet/rehab 8  Sugars adeq control with SSI- monitor and start meds as diet advances   GOLD,WAYNE E 10/27/2014 7:27 AM  Patient seen and examined, agree with above She looks really good this morning  Remo Lipps C. Roxan Hockey, MD [facility] [phone]

## 2014-10-27 NOTE — Care Management Note (Addendum)
Case Management Note  Patient Details  Name: Rinoa Garramone MRN: [id number] Date of Birth: [birth year]  Subjective/Objective:                 Pt from [facility] admitted with  LEFT LOWER LOBE NODULE.    Action/Plan: Return to ALF when medically stable. CM to f/u with d/c needs.  Expected Discharge Date:                  Expected Discharge Plan:  Assisted Living / Rest Home  In-House Referral:  Clinical Social Work  Discharge planning Services  CM Consult  Post Acute Care Choice:    Choice offered to:     DME Arranged:    DME Agency:     HH Arranged:    HH Agency:     Status of Service:  In process, will continue to follow  Medicare Important Message Given:    Date Medicare IM Given:    Medicare IM give by:    Date Additional Medicare IM Given:    Additional Medicare Important Message give by:     If discussed at [place] of Stay Meetings, dates discussed:    Additional Comments: [facility]  [phone],  CSW is aware.  Whitman Hero [place], [place] [phone] 10/27/2014, 2:22 PM

## 2014-10-28 ENCOUNTER — Inpatient Hospital Stay (HOSPITAL_COMMUNITY): Payer: Medicare Other

## 2014-10-28 LAB — COMPREHENSIVE METABOLIC PANEL
ALT: 44 U/L (ref 14–54)
ANION GAP: 7 (ref 5–15)
AST: 54 U/L — ABNORMAL HIGH (ref 15–41)
Albumin: 2.9 g/dL — ABNORMAL LOW (ref 3.5–5.0)
Alkaline Phosphatase: 237 U/L — ABNORMAL HIGH (ref 38–126)
BUN: 26 mg/dL — ABNORMAL HIGH (ref 6–20)
CALCIUM: 7.5 mg/dL — AB (ref 8.9–10.3)
CHLORIDE: 104 mmol/L (ref 101–111)
CO2: 29 mmol/L (ref 22–32)
CREATININE: 1.12 mg/dL — AB (ref 0.44–1.00)
GFR calc Af Amer: 59 mL/min — ABNORMAL LOW (ref >60)
GFR calc non Af Amer: 51 mL/min — ABNORMAL LOW (ref >60)
Glucose, Bld: 148 mg/dL — ABNORMAL HIGH (ref 65–99)
Potassium: 4.7 mmol/L (ref 3.5–5.1)
Sodium: 140 mmol/L (ref 135–145)
TOTAL PROTEIN: 6 g/dL — AB (ref 6.5–8.1)
Total Bilirubin: 0.2 mg/dL — ABNORMAL LOW (ref 0.3–1.2)

## 2014-10-28 LAB — GLUCOSE, CAPILLARY
GLUCOSE-CAPILLARY: 141 mg/dL — AB (ref 65–99)
GLUCOSE-CAPILLARY: 173 mg/dL — AB (ref 65–99)
Glucose-Capillary: 128 mg/dL — ABNORMAL HIGH (ref 65–99)
Glucose-Capillary: 215 mg/dL — ABNORMAL HIGH (ref 65–99)

## 2014-10-28 LAB — CBC
HEMATOCRIT: 28.3 % — AB (ref 36.0–46.0)
HEMOGLOBIN: 9 g/dL — AB (ref 12.0–15.0)
MCH: 27.4 pg (ref 26.0–34.0)
MCHC: 31.8 g/dL (ref 30.0–36.0)
MCV: 86 fL (ref 78.0–100.0)
Platelets: 145 10*3/uL — ABNORMAL LOW (ref 150–400)
RBC: 3.29 MIL/uL — AB (ref 3.87–5.11)
RDW: 13.5 % (ref 11.5–15.5)
WBC: 7.4 10*3/uL (ref 4.0–10.5)

## 2014-10-28 MED ORDER — GUAIFENESIN ER 600 MG PO TB12
1200.0000 mg | ORAL_TABLET | Freq: Two times a day (BID) | ORAL | Status: DC
  Administered 2014-10-28 – 2014-10-31 (×7): 1200 mg via ORAL
  Filled 2014-10-28 (×8): qty 2

## 2014-10-28 MED ORDER — METFORMIN HCL 500 MG PO TABS
1000.0000 mg | ORAL_TABLET | Freq: Two times a day (BID) | ORAL | Status: DC
  Administered 2014-10-28 – 2014-10-31 (×7): 1000 mg via ORAL
  Filled 2014-10-28 (×9): qty 2

## 2014-10-28 MED ORDER — BENZONATATE 100 MG PO CAPS
200.0000 mg | ORAL_CAPSULE | Freq: Three times a day (TID) | ORAL | Status: DC | PRN
  Filled 2014-10-28: qty 2

## 2014-10-28 NOTE — Discharge Summary (Addendum)
Physician Discharge Summary  Patient ID: [name] MRN: [id number] DOB/AGE: 1951/01/28 64 y.o.  Admit date: 10/26/2014 Discharge date: 10/28/2014  Admission Diagnoses: Left lung nodule  Discharge Diagnoses: Metastatic adenocarcinoma of the rectum Active Problems:   Lung nodule  Patient Active Problem List   Diagnosis Date Noted  . Lung nodule 10/26/2014  . Iron deficiency 04/27/2014  . Chronic renal disease, stage 3, moderately decreased glomerular filtration rate between 30-59 mL/min/1.73 square meter 04/27/2014  . Incidental pulmonary nodule, greater than or equal to 22m 04/26/2014  . Abdominal pain 04/26/2014  . Insomnia 04/26/2014  . Rectal cancer 02/18/2013  . Helicobacter pylori gastritis 02/08/2013  . Elevated liver enzymes 12/02/2012  . Anemia 08/29/2008  . ASTHMA, WITH ACUTE EXACERBATION 07/01/2008  . FLANK PAIN, LEFT 05/27/2008  . SINUS TACHYCARDIA 03/02/2008  . INTERTRIGO, CANDIDAL 10/19/2007  . DIABETES MELLITUS, TYPE II, UNCONTROLLED 07/27/2007  . LIVER FUNCTION TESTS, ABNORMAL 04/28/2007  . UNSPECIFIED SLEEP DISTURBANCE 02/03/2007  . DISEASE, PANCREAS NOS 11/20/2006  . HYPERLIPIDEMIA 11/03/2006  . GOUT NOS 11/03/2006  . DEPRESSION 11/03/2006  . RETARDATION, MENTAL NOS 11/03/2006  . Essential hypertension 11/03/2006  . ALLERGIC RHINITIS 11/03/2006  . ASTHMA 11/03/2006  . ARTHRITIS 11/03/2006  . SEIZURE DISORDER 11/03/2006  . MIGRAINES, HX OF 11/03/2006    HPI: at admission [name] a 64year old woman with a past medical history significant for rectal cancer (T3, N0), uncontrolled type 2 diabetes, hyperlipidemia, anemia, asthma, seizure disorder, arthritis, and stage III chronic kidney disease. She is mentally challenged and lives in a group home. She is a poor historian and history and review of systems are limited. She is accompanied by a worker from the group home.  She was treated with chemotherapy, radiation, and surgery (APR) for rectal  cancer. She has been followed with serial CT scans. She was noted to have a nodule in the superior segment of the left lower lung. On her most recent CT the nodule measured 8 x 10 mm. A PET CT was done which showed the nodule to be 11.5 mm in diameter and was hypermetabolic with an SUV of 7.9.  She has an unknown smoking history. By report she quit smoking years ago but did have some regular smoking, today she says that she only smoked 1 cigarette in her life. She says she gets short of breath with exertion, but can walk up a ramp and down the entire length of the hall at her home without any stopping to rest. She denies wheezing but is on the bronchial dilators. She denies cough and hemoptysis. She denies weight loss and change in appetite  She was admitted this hospitalization for resection.  Discharged Condition: good  Hospital Course: The patient was admitted electively for the procedure which she has tolerated well. All routine lines, monitors and drainage devices have been removed in the standard fasion. She has a mild acute blood loss anemia which is stable. She is hemodyn stable, but has had some HTN so norvasc has been increased. Also added lisinopril . Incis are healing well. CXR is stable in appearance. She has a mild cough and is clearing secretions without too much problem. Oxygen has been weaned. She is ambulating pretty well. She is felt to be stable for discharge to the group home on todays date  Consults: None  Significant Diagnostic Studies: Routine postoperative serial chest x-ray and labs.  Treatments: surgery:   DATE OF PROCEDURE: 10/26/2014 DATE OF DISCHARGE:   OPERATIVE REPORT   PREOPERATIVE DIAGNOSIS:  Left lower lobe nodule.  POSTOPERATIVE DIAGNOSIS: Adenocarcinoma, left lower lobe.  PROCEDURE: Left video-assisted thoracoscopy, left lower lobe superior segmentectomy, mediastinal lymph node sampling, and On-Q local anesthetic  catheter placement.  SURGEON: Revonda Standard. Roxan Hockey, M.D.  ASSISTANT: John Giovanni, PA-C  ANESTHESIA: General.  FINDINGS: A 1 cm mass palpable in the superior segment of the left lower lobe, relatively normal-appearing lymph nodes. Frozen section revealed adenocarcinoma with clear margins.   PPpathology pathology PathologybFINAL DIAGNOSIS Diagnosis: Pathology: 1. Lung, wedge biopsy/resection, superior segment of left lower lobe - METASTATIC ADENOCARCINOMA, CONSISTENT WITH COLONIC PRIMARY, SPANNING 1.5 CM. - LYMPHOVASCULAR INVASION IS IDENTIFIED. - THE SURGICAL RESECTION MARGINS ARE NEGATIVE FOR CARCINOMA. - SEE COMMENT. 2. Lymph node, biopsy, 9 L - THERE IS NO EVIDENCE OF CARCINOMA IN 1 OF 1 LYMPH NODE (0/1). 3. Lymph node, biopsy, 9 L #2 - THERE IS NO EVIDENCE OF CARCINOMA IN 1 OF 1 LYMPH NODE (0/1). 4. Lymph node, biopsy, 11 L - THERE IS NO EVIDENCE OF CARCINOMA IN 1 OF 1 LYMPH NODE (0/1). 5. Lymph node, biopsy, 5 - THERE IS NO EVIDENCE OF CARCINOMA IN 1 OF 1 LYMPH NODE (0/1). Microscopic Comment 1. A block will be sent for KRAS testing and the results reported separately. Additional testing can be performed upon clinician request. (JBK:ds 10/27/14) Jennifer Cutter MD Pathologist, Electronic Signature (Case signed 10/27/2014) Intraoperative Diagnosis 1. FROZEN SECTION DIAGNOSIS, SUPERIOR SEGMENT LEFT LOWER LOBE: ADENOCARCINOMA. MARGINS NEGATIVE FOR TUMOR. (CRR) Total: 3 Specimen Gross and Clinical Information Specimen(s) Obtained: 1. Lung, wedge biopsy/resection, superior segment of left lower lobe 1 of 2 FINAL for [name] [phone]) Specimen(s) Obtained:(continued) 2. Lymph node, biopsy, 9 L 3. Lymph node, biopsy, 9 L #2 4. Lymph node, biopsy, 11 L 5. Lymph node, biopsy, 5 Specimen Clinical Information 1. left lower lobe nodule (cm) Gross 1. Specimen: Received fresh for rapid intraoperative consultation and labeled left lower lobe superior  segment. Specimen integrity (intact/incised/disrupted): Focally disrupted, with multiple stapled resection lines. Size, weight: 7.8 x 5.2 x 2.3 cm. Weight, 28 grams. Pleura: Pink-purple, smooth, and focally anthracotic. Cut Surface: Spongy red-brown, with a 1.5 x 1.2 x 0.9 cm tan, firm, ill-defined lesion identified. A representative section of the lesion is submitted for frozen section analysis. Margin(s): The lesion measures approximately 2.5 to the closest parenchymal resection margin, and 3.0 cm to the bronchial resection margin. The two closest margins are submitted for frozen section analysis. Block Summary: Representative sections are submitted in six cassettes. A = parenchymal margin, frozen section remnant. B = bronchial margin, frozen section remnant. C = lesion, frozen section remnant. D, E = remainder of lesion. F = uninvolved parenchyma. 2. The specimen is received in saline and consists of a 0.9 x 0.6 x 0.2 cm piece of tan-gray, anthracotic possible lymph node. The specimen is entirely submitted in one cassette. 3. The specimen is received in saline and consists of a 1.0 x 0.5 x 0.4 cm piece of tan-gray, anthracotic possible lymph node and adipose tissue. The specimen is entirely submitted in one cassette. 4. The specimen is received in saline and consists of a 0.8 x 0.4 x 0.2 cm piece of gray-black anthracotic possible lymph node. The specimen is entirely submitted in one cassette. 5. The specimen is received in saline and consists of a 1.7 x 1.3 x 0.4 cm piece of gray-black, anthracotic possible lymph node and adipose tissue. The specimen is entirely submitted in one cassette. (KL:ecj 10/26/2014) Report signed out from the following location(s) Technical component  and interpretation was performed at [facility] [place], [place], [place] [address]. CLIA #: [id number],    Medication List    TAKE these medications        ACCU-CHEK AVIVA PLUS test  strip  Generic drug:  glucose blood  1 each by Other route daily. Use as instructed     amLODipine 10 MG tablet  Commonly known as:  NORVASC  Take 1 tablet (10 mg total) by mouth every morning.     aspirin EC 81 MG tablet  Take 81 mg by mouth daily.     CERAVE Lotn  Apply 1 application topically daily. Apply to both feet     docusate sodium 100 MG capsule  Commonly known as:  COLACE  Take 100 mg by mouth 2 (two) times daily.     ferrous sulfate 325 (65 FE) MG tablet  TAKE (1) TABLET BY MOUTH TWICE DAILY.     fish oil-omega-3 fatty acids 1000 MG capsule  Take 1 g by mouth 2 (two) times daily.     Fluticasone-Salmeterol 250-50 MCG/DOSE Aepb  Commonly known as:  ADVAIR  Inhale 1 puff into the lungs every 12 (twelve) hours.     HYDROcodone-acetaminophen 5-325 MG per tablet  Commonly known as:  NORCO/VICODIN  Take 1 tablet by mouth every 4 (four) hours as needed for moderate pain. For pain not relieved by tramadol     ibuprofen 600 MG tablet  Commonly known as:  ADVIL,MOTRIN  Take 600 mg by mouth every 6 (six) hours as needed for moderate pain.     LANTUS SOLOSTAR 100 UNIT/ML Solostar Pen  Generic drug:  Insulin Glargine  Inject 12 Units into the skin at bedtime.     lisinopril 10 MG tablet  Commonly known as:  PRINIVIL,ZESTRIL  Take 1 tablet (10 mg total) by mouth daily.     LORATADINE-D 24HR 10-240 MG per 24 hr tablet  Generic drug:  loratadine-pseudoephedrine  Take 1 tablet by mouth daily.     metFORMIN 1000 MG tablet  Commonly known as:  GLUCOPHAGE  Take 1,000 mg by mouth 2 (two) times daily with a meal.     metoprolol succinate 50 MG 24 hr tablet  Commonly known as:  TOPROL-XL  Take 100 mg by mouth at bedtime.     montelukast 10 MG tablet  Commonly known as:  SINGULAIR  Take 10 mg by mouth at bedtime.     pantoprazole 40 MG tablet  Commonly known as:  PROTONIX  Take 40 mg by mouth every morning.     phenytoin 100 MG ER capsule  Commonly known as:   DILANTIN  Take 100 mg by mouth 2 (two) times daily.     pravastatin 40 MG tablet  Commonly known as:  PRAVACHOL  Take 40 mg by mouth every evening.     silver sulfADIAZINE 1 % cream  Commonly known as:  SILVADENE  Apply 1 application topically every morning.     sodium chloride 0.65 % Soln nasal spray  Commonly known as:  OCEAN  Place 2 sprays into both nostrils 4 (four) times daily as needed for congestion.     traMADol 50 MG tablet  Commonly known as:  ULTRAM  Take 50 mg by mouth every 4 (four) hours as needed for moderate pain.     triamcinolone cream 0.1 %  Commonly known as:  KENALOG  Apply 1 application topically 2 (two) times daily. To stoma  Discharge Exam: Blood pressure 160/77, pulse 85, temperature 98.2 F (36.8 C), temperature source Oral, resp. rate 17, height 5' 4"  (1.626 m), weight 230 lb 9.6 oz (104.6 kg), SpO2 98 %.  General appearance: alert, cooperative and no distress Heart: regular rate and rhythm Lungs: coarse, improves with cough Abdomen: benign Extremities: no edema Wound: incis healing well   Disposition: Nursing facility(group home)      Follow-up Information    Follow up with [name], MD.   Specialty:  Cardiothoracic Surgery   Why:  11/15/2014 at 12:30 PM to see the surgeon. Please obtain a chest x-ray at 11:30 AM at [facility] which is located in the same office complex.   Contact information:   [address] [place] [address] [phone]       Follow up with [facility].   Specialty:  Cardiothoracic Surgery   Why:  11/04/2014 at 10:15 AM to see the nurse for chest tube suture removal   Contact information:   [place], [place] [phone]      Signed: John Giovanni 10/28/2014, 9:17 AM

## 2014-10-28 NOTE — Care Management Important Message (Signed)
Important Message  Patient Details  Name: [name] MRN: [id number] Date of Birth: Mar 17, 1951   Medicare Important Message Given:  [facility] notification given    Nathen May 10/28/2014, 12:30 [place] Message  Patient Details  Name: [name] MRN: [id number] Date of Birth: [birth year]   Medicare Important Message Given:  Yes-second notification given    Nathen May 10/28/2014, 12:30 PM

## 2014-10-28 NOTE — Progress Notes (Addendum)
[address]       [place] [address]             [phone]      2 Days Post-Op Procedure(s) (LRB): LEFT VIDEO ASSISTED THORACOSCOPY (Left) LEFT LOWER LOBE SUPERIOR SEGMENTECTOMY (Left) LYMPH NODE DISSECTION (Left) Subjective: conts to feel pretty well, has developed a productive cough  Objective: Vital signs in last 24 hours: Temp:  [97.8 F (36.6 C)-99.4 F (37.4 C)] 98.2 F (36.8 C) (08/12 0746) Pulse Rate:  [85-105] 85 (08/12 0426) Cardiac Rhythm:  [-] Normal sinus rhythm (08/12 0426) Resp:  [17-23] 17 (08/12 0426) BP: (132-165)/(52-82) 160/77 mmHg (08/12 0426) SpO2:  [94 %-97 %] 96 % (08/12 0426)  Hemodynamic parameters for last 24 hours:    Intake/Output from previous day: 08/11 0701 - 08/12 0700 In: 2107.9 [P.O.:600; I.V.:1507.9] Out: 5861 [Urine:5650; Stool:55; Chest Tube:156] Intake/Output this shift:    General appearance: alert, cooperative and no distress Heart: regular rate and rhythm Lungs: dim in bases Abdomen: benign Extremities: minor edema Wound: incis healing well  Lab Results:  Recent Labs  10/27/14 0430 10/28/14 0430  WBC 12.2* 7.4  HGB 9.3* 9.0*  HCT 29.1* 28.3*  PLT 158 145*   BMET:  Recent Labs  10/27/14 0430 10/28/14 0430  NA 134* 140  K 5.6* 4.7  CL 99* 104  CO2 25 29  GLUCOSE 214* 148*  BUN 27* 26*  CREATININE 1.11* 1.12*  CALCIUM 7.6* 7.5*    PT/INR: No results for input(s): LABPROT, INR in the last 72 hours. ABG    Component Value Date/Time   PHART 7.377 10/27/2014 0434   HCO3 24.2* 10/27/2014 0434   TCO2 25.5 10/27/2014 0434   ACIDBASEDEF 0.3 10/27/2014 0434   O2SAT 98.6 10/27/2014 0434   CBG (last 3)   Recent Labs  10/27/14 1244 10/27/14 1538 10/27/14 2043  GLUCAP 175* 153* 163*    Meds Scheduled Meds: . acetaminophen  1,000 mg Oral 4 times per day   Or  . acetaminophen (TYLENOL) oral liquid 160 mg/5 mL  1,000 mg Oral 4 times per day  . amLODipine  5 mg Oral q morning  - 10a  . aspirin EC  81 mg Oral Daily  . bisacodyl  10 mg Oral Daily  . feeding supplement  1 Container Oral TID BM  . ferrous sulfate  325 mg Oral BID WC  . fish oil-omega-3 fatty acids  1 g Oral BID  . insulin aspart  0-15 Units Subcutaneous TID WC  . metoprolol succinate  100 mg Oral QHS  . mometasone-formoterol  2 puff Inhalation BID  . montelukast  10 mg Oral QHS  . pantoprazole  40 mg Oral q morning - 10a  . phenytoin  100 mg Oral BID  . pneumococcal 23 valent vaccine  0.5 mL Intramuscular Tomorrow-1000  . pravastatin  40 mg Oral QPM  . senna-docusate  1 tablet Oral QHS   Continuous Infusions: . dextrose 5 % and 0.9% NaCl 50 mL/hr at 10/28/14 0110   PRN Meds:.fentaNYL (SUBLIMAZE) injection, ondansetron (ZOFRAN) IV, oxyCODONE, potassium chloride, sodium chloride, traMADol  Xrays Dg Chest Port 1 View  10/27/2014   CLINICAL DATA:  Lung nodule  EXAM: PORTABLE CHEST - 1 VIEW  COMPARISON:  10/26/2014  FINDINGS: Tubular device is stable. No pneumothorax. Low volumes. Lungs better aerated. Normal heart size.  IMPRESSION: Improved lung volumes.  No pneumothorax.   Electronically Signed   By: Rodena Goldmann.D.  On: 10/27/2014 07:46   Dg Chest Port 1 View  10/26/2014   CLINICAL DATA:  Status post left upper lobe superior segmentectomy.  EXAM: PORTABLE CHEST - 1 VIEW  COMPARISON:  10/24/2014  FINDINGS: There are low lung volumes limiting the exam. E nodule seen on the prior study is no longer evident consistent with it being surgically removed. There is opacity projecting superiorly from the right hilum consistent with atelectasis. There is mild basilar atelectasis. There is no pulmonary edema.  There is no convincing pneumothorax on this semi-erect study.  Cardiac silhouette is normal in size.  Left chest tube tip projects along the lateral left mid to upper hemi thorax pleural space appear left internal jugular central venous catheter has its tip in the superior vena cava near the junction of  the left brachiocephalic vein.  IMPRESSION: 1. Low lung volumes and areas of atelectasis, but no overt pulmonary edema. 2. No pneumothorax appreciated. 3. Well-positioned left chest tube. Left internal jugular central venous line with its tip near the junction of the left brachiocephalic and superior vena cava.   Electronically Signed   By: Lajean Manes M.D.   On: 10/26/2014 14:18    Assessment/Plan: S/P Procedure(s) (LRB): LEFT VIDEO ASSISTED THORACOSCOPY (Left) LEFT LOWER LOBE SUPERIOR SEGMENTECTOMY (Left) LYMPH NODE DISSECTION (Left)  1 chest tube without air leak, min drainage d/c today 2 HTN/Tachy at times- may need additional agent if persists, on home meds currently 3 cont to push rehab/pulm toilet 4 labs stable 5 sugars adeq control- restart metformin 6 SW assisting with return to facility when medically stable   LOS: 2 days    GOLD,WAYNE E 10/28/2014  Patient seen and examined, agree with above Dc chest tube Mucinex, tessalon for cough  Remo Lipps C. Roxan Hockey, MD [facility] [phone]

## 2014-10-28 NOTE — Discharge Instructions (Signed)
Video-Assisted Thoracic Surgery Care After  Refer to this sheet in the next few weeks. These instructions provide you with information on caring for yourself after your procedure. Your doctor may also give you more specific instructions. Your procedure has been planned according to current medical practices, but problems sometimes occur. Call your doctor if you have any problems or questions after your procedure. HOME CARE   Only take over-the-counter or prescription medicines as told by your doctor.  Only take pain medicines (narcotics) as told by your doctor.  Do not drive until your doctor says it is OK. Driving while taking pain medicines or soon after surgery can be dangerous.  Avoid activities that use your chest muscles for at least 3-4 weeks. These include lifting heavy objects.  Take deep breaths. This expands the lungs and protects against a lung infection (pneumonia).  Do breathing exercises as told by your doctor. If you were given a device to help with breathing, use it as told by your doctor.  You may resume a normal diet and activities when you feel you are able to or as told by your doctor.  Do not take a bath until your doctor says it is OK. Use the shower instead.  Keep the bandage (dressing) covering the area where the chest tube was put (incision site) dry for 48 hours. Remove the bandage after 48 hours unless there is new fluid on the bandage.  Remove bandages as told by your doctor.  Change bandages if necessary or as told by your doctor.  Keep all follow-up doctor visits. It is important to see your doctor after surgery. You may need follow-up care and your condition may need to be watched. GET HELP RIGHT AWAY IF:  You have a fever.  You have chest pain.  You have a rash.  You have shortness of breath.  You have trouble breathing.  You feel weak, lightheaded, dizzy, or faint.  You feel a lot of pain near an area where a surgical cut was made.  The  pain at an area where a surgical cut was made is getting worse.  You notice bleeding, fluid, or pus coming from where a surgical cut was made.  You notice irritation, puffiness (swelling), or redness near the surgical cut.  There is a bad smell coming from from a bandage or from an area where a surgical cut was made.  It feels like your heart is fluttering or beating rapidly.  Your pain medicine does not relieve your pain. MAKE SURE YOU:   Understand these instructions.  Will watch your condition.  Will get help right away if you are not doing well or get worse. Document Released: 06/29/2012 Document Reviewed: 06/29/2012 [facility] Patient Information 2015 [place]. This information is not intended to replace advice given to you by your health care provider. Make sure you discuss any questions you have with your health care provider.

## 2014-10-28 NOTE — Anesthesia Postprocedure Evaluation (Signed)
  Anesthesia Post-op Note  Patient: [name]  Procedure(s) Performed: Procedure(s): LEFT VIDEO ASSISTED THORACOSCOPY (Left) LEFT LOWER LOBE SUPERIOR SEGMENTECTOMY (Left) LYMPH NODE DISSECTION (Left)  Patient Location: PACU  Anesthesia Type:General  Level of Consciousness: awake and alert   Airway and Oxygen Therapy: Patient Spontanous Breathing  Post-op Pain: none  Post-op Assessment: Post-op Vital signs reviewed LLE Motor Response: Purposeful movement, Responds to commands   RLE Motor Response: Purposeful movement, Responds to commands        Post-op Vital Signs: Reviewed  Last Vitals:  Filed Vitals:   10/28/14 0746  BP:   Pulse:   Temp: 36.8 C  Resp:     Complications: No apparent anesthesia complications

## 2014-10-29 ENCOUNTER — Inpatient Hospital Stay (HOSPITAL_COMMUNITY): Payer: Medicare Other

## 2014-10-29 DIAGNOSIS — C7802 Secondary malignant neoplasm of left lung: Secondary | ICD-10-CM | POA: Diagnosis not present

## 2014-10-29 LAB — GLUCOSE, CAPILLARY
GLUCOSE-CAPILLARY: 192 mg/dL — AB (ref 65–99)
GLUCOSE-CAPILLARY: 65 mg/dL (ref 65–99)
Glucose-Capillary: 107 mg/dL — ABNORMAL HIGH (ref 65–99)
Glucose-Capillary: 177 mg/dL — ABNORMAL HIGH (ref 65–99)
Glucose-Capillary: 44 mg/dL — CL (ref 65–99)
Glucose-Capillary: 179 mg/dL — ABNORMAL HIGH (ref 65–99)

## 2014-10-29 MED ORDER — ACETAMINOPHEN 500 MG PO TABS
1000.0000 mg | ORAL_TABLET | Freq: Four times a day (QID) | ORAL | Status: DC
  Administered 2014-10-29 – 2014-10-31 (×8): 1000 mg via ORAL
  Filled 2014-10-29 (×6): qty 2

## 2014-10-29 MED ORDER — ACETAMINOPHEN 160 MG/5ML PO SOLN
1000.0000 mg | Freq: Four times a day (QID) | ORAL | Status: DC
  Filled 2014-10-29: qty 40

## 2014-10-29 MED ORDER — AMLODIPINE BESYLATE 10 MG PO TABS: 10.0000 mg | ORAL_TABLET | Freq: Every morning | ORAL | Status: DC

## 2014-10-29 MED ORDER — HYDROCODONE-ACETAMINOPHEN 5-325 MG PO TABS: 1.0000 | ORAL_TABLET | ORAL | Status: DC | PRN

## 2014-10-29 MED ORDER — PNEUMOCOCCAL VAC POLYVALENT 25 MCG/0.5ML IJ INJ
0.5000 mL | INJECTION | Freq: Once | INTRAMUSCULAR | Status: DC
  Filled 2014-10-29: qty 0.5

## 2014-10-29 NOTE — Progress Notes (Addendum)
[address]       [place] [address]             [phone]      3 Days Post-Op Procedure(s) (LRB): LEFT VIDEO ASSISTED THORACOSCOPY (Left) LEFT LOWER LOBE SUPERIOR SEGMENTECTOMY (Left) LYMPH NODE DISSECTION (Left) Subjective: Looks and feels ok, some cough persists but clearing secretions fine  Objective: Vital signs in last 24 hours: Temp:  [98 F (36.7 C)-98.6 F (37 C)] 98.6 F (37 C) (08/13 0753) Pulse Rate:  [76-100] 85 (08/13 0431) Cardiac Rhythm:  [-] Normal sinus rhythm;Sinus tachycardia (08/12 1514) Resp:  [12-24] 19 (08/13 0431) BP: (135-177)/(65-88) 170/77 mmHg (08/13 0431) SpO2:  [95 %-99 %] 98 % (08/13 0431)  Hemodynamic parameters for last 24 hours:    Intake/Output from previous day: 08/12 0701 - 08/13 0700 In: 1360 [P.O.:1360] Out: 3101 [Urine:3100; Stool:1] Intake/Output this shift:    General appearance: alert, cooperative and no distress Heart: regular rate and rhythm Lungs: coarse, improves with cough Abdomen: benign Extremities: no edema Wound: incis healing well  Lab Results:  Recent Labs  10/27/14 0430 10/28/14 0430  WBC 12.2* 7.4  HGB 9.3* 9.0*  HCT 29.1* 28.3*  PLT 158 145*   BMET:  Recent Labs  10/27/14 0430 10/28/14 0430  NA 134* 140  K 5.6* 4.7  CL 99* 104  CO2 25 29  GLUCOSE 214* 148*  BUN 27* 26*  CREATININE 1.11* 1.12*  CALCIUM 7.6* 7.5*    PT/INR: No results for input(s): LABPROT, INR in the last 72 hours. ABG    Component Value Date/Time   PHART 7.377 10/27/2014 0434   HCO3 24.2* 10/27/2014 0434   TCO2 25.5 10/27/2014 0434   ACIDBASEDEF 0.3 10/27/2014 0434   O2SAT 98.6 10/27/2014 0434   CBG (last 3)   Recent Labs  10/28/14 1655 10/28/14 2201 10/29/14 0751  GLUCAP 141* 173* 107*    Meds Scheduled Meds: . acetaminophen  1,000 mg Oral 4 times per day   Or  . acetaminophen (TYLENOL) oral liquid 160 mg/5 mL  1,000 mg Oral 4 times per day  . amLODipine  5 mg Oral q  morning - 10a  . aspirin EC  81 mg Oral Daily  . bisacodyl  10 mg Oral Daily  . feeding supplement  1 Container Oral TID BM  . ferrous sulfate  325 mg Oral BID WC  . fish oil-omega-3 fatty acids  1 g Oral BID  . guaiFENesin  1,200 mg Oral BID  . insulin aspart  0-15 Units Subcutaneous TID WC  . metFORMIN  1,000 mg Oral BID WC  . metoprolol succinate  100 mg Oral QHS  . mometasone-formoterol  2 puff Inhalation BID  . montelukast  10 mg Oral QHS  . pantoprazole  40 mg Oral q morning - 10a  . phenytoin  100 mg Oral BID  . pneumococcal 23 valent vaccine  0.5 mL Intramuscular Tomorrow-1000  . pravastatin  40 mg Oral QPM  . senna-docusate  1 tablet Oral QHS   Continuous Infusions:  PRN Meds:.benzonatate, fentaNYL (SUBLIMAZE) injection, ondansetron (ZOFRAN) IV, oxyCODONE, potassium chloride, sodium chloride, traMADol  Xrays Dg Chest 2 View  10/29/2014   CLINICAL DATA:  Lung nodule  EXAM: CHEST  2 VIEW  COMPARISON:  10/28/2014  FINDINGS: The lung nodule in the superior segment of the left lower lobe is not well delineated.  There is a possible trace left pleural effusion and basilar atelectasis. There is no  other focal parenchymal opacity. There is no pneumothorax. The heart mediastinum are stable. Left jugular central venous catheter with the tip projecting over the SVC. There is no acute osseous abnormality.  IMPRESSION: 1. Previously demonstrated lung nodule in the superior segment of the left lower lobe is not well delineated on the current exam and was better seen on CT. 2. Possible trace left pleural effusion and basilar atelectasis.   Electronically Signed   By: Kathreen Devoid   On: 10/29/2014 08:57   Dg Chest Port 1 View  10/28/2014   CLINICAL DATA:  Status post chest tube removal  EXAM: PORTABLE CHEST - 1 VIEW  COMPARISON:  10/28/2014  FINDINGS: The previously seen left-sided chest tube is been removed in the interval. No recurrent pneumothorax is noted. A left jugular central line is again  seen in the proximal superior vena cava. The overall inspiratory effort is poor although no focal infiltrate is seen. No bony abnormality is noted.  IMPRESSION: No recurrent pneumothorax following chest tube removal.   Electronically Signed   By: Inez Catalina M.D.   On: 10/28/2014 17:56   Dg Chest Port 1 View  10/28/2014   CLINICAL DATA:  Chest tubes, re-evaluation  EXAM: PORTABLE CHEST - 1 VIEW  COMPARISON:  10/27/2014  FINDINGS: X-ray performed 10/28/2014 at 0748 hours.  Left jugular central venous catheter with the tip projecting over the SVC. Overall there are low lung volumes. There is a left-sided chest tube in unchanged, satisfactory position. There is no focal parenchymal opacity. There is no pleural effusion or pneumothorax. The heart and mediastinal contours are unremarkable.  The osseous structures are unremarkable.  IMPRESSION: Left-sided chest tube without a pneumothorax.   Electronically Signed   By: Kathreen Devoid   On: 10/28/2014 08:12    Assessment/Plan: S/P Procedure(s) (LRB): LEFT VIDEO ASSISTED THORACOSCOPY (Left) LEFT LOWER LOBE SUPERIOR SEGMENTECTOMY (Left) LYMPH NODE DISSECTION (Left) Plan for discharge: see discharge orders Will increase norvasc to 10/day  LOS: 3 days    GOLD,WAYNE E 10/29/2014  Was to go home but group home pharmacy not open until Monday so would not take her. I have seen and examined [name] and agree with the above assessment  and plan.  Grace Isaac MD Beeper [phone] Office [phone] 10/30/2014 9:27 AM

## 2014-10-29 NOTE — Progress Notes (Signed)
CSW called [facility] re: pt's return to their facility.  Per facility, they cannot get any medication for pt until their pharmacy opens on Monday am.  D/C summary has been faxed for their review and CSW will prepare FL2 for MD's signature.  Sunday CSW will be updated.

## 2014-10-29 NOTE — Progress Notes (Signed)
Patient to transfer to 2W14 report given to receiving nurse Marge, all questions answered at this time.  Pt. VSS with no s/s of distress noted.  Patient stable at transfer.

## 2014-10-30 LAB — GLUCOSE, CAPILLARY
Glucose-Capillary: 91 mg/dL (ref 65–99)
Glucose-Capillary: 132 mg/dL — ABNORMAL HIGH (ref 65–99)
Glucose-Capillary: 118 mg/dL — ABNORMAL HIGH (ref 65–99)
Glucose-Capillary: 135 mg/dL — ABNORMAL HIGH (ref 65–99)

## 2014-10-30 MED ORDER — AMLODIPINE BESYLATE 10 MG PO TABS
10.0000 mg | ORAL_TABLET | Freq: Every morning | ORAL | Status: DC
  Administered 2014-10-30 – 2014-10-31 (×2): 10 mg via ORAL
  Filled 2014-10-30 (×2): qty 1

## 2014-10-30 NOTE — Progress Notes (Addendum)
Patient accu. Ck 44 . Patient voice no complaints. Skin warm and dry. Time for patient H.S. Snack and she wanted a [place] sandwich and some peaches. Patient ate that and check her sugar 15 mins after eating and it came up to 65 at 21:38. Reck. accu ck. At 23:08 it was 179, R.N. Was aware.

## 2014-10-30 NOTE — Progress Notes (Addendum)
[address]       ,[place] [address]             [phone]      4 Days Post-Op Procedure(s) (LRB): LEFT VIDEO ASSISTED THORACOSCOPY (Left) LEFT LOWER LOBE SUPERIOR SEGMENTECTOMY (Left) LYMPH NODE DISSECTION (Left) Subjective: Feels ok, cough is a bit better per patient  Objective: Vital signs in last 24 hours: Temp:  [97.9 F (36.6 C)-98.6 F (37 C)] 97.9 F (36.6 C) (08/14 0517) Pulse Rate:  [77-103] 77 (08/14 0517) Cardiac Rhythm:  [-] Normal sinus rhythm (08/13 2035) Resp:  [14-22] 16 (08/14 0517) BP: (97-185)/(61-81) 147/71 mmHg (08/14 0517) SpO2:  [96 %-100 %] 99 % (08/14 0517)  Hemodynamic parameters for last 24 hours:    Intake/Output from previous day: 08/13 0701 - 08/14 0700 In: 720 [P.O.:720] Out: 1500 [Urine:1500] Intake/Output this shift:    General appearance: alert, cooperative and no distress Heart: regular rate and rhythm Lungs: clear to auscultation bilaterally Abdomen: benign Extremities: no edema Wound: incis healing well  Lab Results:  Recent Labs  10/28/14 0430  WBC 7.4  HGB 9.0*  HCT 28.3*  PLT 145*   BMET:  Recent Labs  10/28/14 0430  NA 140  K 4.7  CL 104  CO2 29  GLUCOSE 148*  BUN 26*  CREATININE 1.12*  CALCIUM 7.5*    PT/INR: No results for input(s): LABPROT, INR in the last 72 hours. ABG    Component Value Date/Time   PHART 7.377 10/27/2014 0434   HCO3 24.2* 10/27/2014 0434   TCO2 25.5 10/27/2014 0434   ACIDBASEDEF 0.3 10/27/2014 0434   O2SAT 98.6 10/27/2014 0434   CBG (last 3)   Recent Labs  10/29/14 2138 10/29/14 2308 10/30/14 0632  GLUCAP 65 179* 91    Meds Scheduled Meds: . acetaminophen  1,000 mg Oral 4 times per day   Or  . acetaminophen (TYLENOL) oral liquid 160 mg/5 mL  1,000 mg Oral 4 times per day  . amLODipine  5 mg Oral q morning - 10a  . aspirin EC  81 mg Oral Daily  . bisacodyl  10 mg Oral Daily  . feeding supplement  1 Container Oral TID BM  . ferrous  sulfate  325 mg Oral BID WC  . fish oil-omega-3 fatty acids  1 g Oral BID  . guaiFENesin  1,200 mg Oral BID  . insulin aspart  0-15 Units Subcutaneous TID WC  . metFORMIN  1,000 mg Oral BID WC  . metoprolol succinate  100 mg Oral QHS  . mometasone-formoterol  2 puff Inhalation BID  . montelukast  10 mg Oral QHS  . pantoprazole  40 mg Oral q morning - 10a  . phenytoin  100 mg Oral BID  . pneumococcal 23 valent vaccine  0.5 mL Intramuscular Once  . pravastatin  40 mg Oral QPM  . senna-docusate  1 tablet Oral QHS   Continuous Infusions:  PRN Meds:.benzonatate, fentaNYL (SUBLIMAZE) injection, ondansetron (ZOFRAN) IV, oxyCODONE, potassium chloride, sodium chloride, traMADol  Xrays Dg Chest 2 View  10/29/2014   CLINICAL DATA:  Lung nodule  EXAM: CHEST  2 VIEW  COMPARISON:  10/28/2014  FINDINGS: The lung nodule in the superior segment of the left lower lobe is not well delineated.  There is a possible trace left pleural effusion and basilar atelectasis. There is no other focal parenchymal opacity. There is no pneumothorax. The heart mediastinum are stable. Left jugular central venous catheter with the  tip projecting over the SVC. There is no acute osseous abnormality.  IMPRESSION: 1. Previously demonstrated lung nodule in the superior segment of the left lower lobe is not well delineated on the current exam and was better seen on CT. 2. Possible trace left pleural effusion and basilar atelectasis.   Electronically Signed   By: Kathreen Devoid   On: 10/29/2014 08:57   Dg Chest Port 1 View  10/28/2014   CLINICAL DATA:  Status post chest tube removal  EXAM: PORTABLE CHEST - 1 VIEW  COMPARISON:  10/28/2014  FINDINGS: The previously seen left-sided chest tube is been removed in the interval. No recurrent pneumothorax is noted. A left jugular central line is again seen in the proximal superior vena cava. The overall inspiratory effort is poor although no focal infiltrate is seen. No bony abnormality is noted.   IMPRESSION: No recurrent pneumothorax following chest tube removal.   Electronically Signed   By: Inez Catalina M.D.   On: 10/28/2014 17:56   Dg Chest Port 1 View  10/28/2014   CLINICAL DATA:  Chest tubes, re-evaluation  EXAM: PORTABLE CHEST - 1 VIEW  COMPARISON:  10/27/2014  FINDINGS: X-ray performed 10/28/2014 at 0748 hours.  Left jugular central venous catheter with the tip projecting over the SVC. Overall there are low lung volumes. There is a left-sided chest tube in unchanged, satisfactory position. There is no focal parenchymal opacity. There is no pleural effusion or pneumothorax. The heart and mediastinal contours are unremarkable.  The osseous structures are unremarkable.  IMPRESSION: Left-sided chest tube without a pneumothorax.   Electronically Signed   By: Kathreen Devoid   On: 10/28/2014 08:12    Assessment/Plan: S/P Procedure(s) (LRB): LEFT VIDEO ASSISTED THORACOSCOPY (Left) LEFT LOWER LOBE SUPERIOR SEGMENTECTOMY (Left) LYMPH NODE DISSECTION (Left)   1 conts to improve, unable to take at group home til Monday- cont rehab/pulm toilet.  2 sugars low at times, may need to decrease glucophage-monitor   LOS: 4 days    [name],Jennifer E 10/30/2014  I have seen and examined [name] and agree with the above assessment  and plan.  Grace Isaac MD Beeper [phone] Office [phone] 10/30/2014 9:25 AM

## 2014-10-31 LAB — GLUCOSE, CAPILLARY
GLUCOSE-CAPILLARY: 100 mg/dL — AB (ref 65–99)
GLUCOSE-CAPILLARY: 83 mg/dL (ref 65–99)

## 2014-10-31 MED ORDER — LISINOPRIL 10 MG PO TABS
10.0000 mg | ORAL_TABLET | Freq: Every day | ORAL | Status: DC
  Administered 2014-10-31: 10 mg via ORAL
  Filled 2014-10-31: qty 1

## 2014-10-31 MED ORDER — AMLODIPINE BESYLATE 10 MG PO TABS: 10.0000 mg | ORAL_TABLET | Freq: Every morning | ORAL | Status: DC

## 2014-10-31 MED ORDER — LISINOPRIL 10 MG PO TABS: 10.0000 mg | ORAL_TABLET | Freq: Every day | ORAL | Status: DC

## 2014-10-31 NOTE — Care Management Note (Signed)
Case Management Note CM note started by Whitman Hero RNCM  Patient Details  Name: [name] MRN: [id number] Date of Birth: 1950-06-10  Subjective/Objective:                 Pt from [facility] ALF admitted with  LEFT LOWER LOBE NODULE.    Action/Plan: Return to ALF when medically stable. CM to f/u with d/c needs.  Expected Discharge Date:       10/31/14           Expected Discharge Plan:  Group Home  In-House Referral:  Clinical Social Work  Discharge planning Services  CM Consult  Post Acute Care Choice:    Choice offered to:     DME Arranged:    DME Agency:     HH Arranged:    HH Agency:     Status of Service:  Completed, signed off  Medicare Important Message Given:  Yes-second notification given Date Medicare IM Given:    Medicare IM give by:    Date Additional Medicare IM Given:    Additional Medicare Important Message give by:     If discussed at [place] of Stay Meetings, dates discussed:    Additional Comments: [facility] ([place])  [phone],  CSW is aware.  10/31/14- per CSW- pt from [facility] not ALF- plan is to return to [facility] today- CSW following for d/c needs. No f/u needed by CM.   Marvetta Gibbons [place], [place] [phone] 10/31/2014, 10:32 AM

## 2014-10-31 NOTE — Progress Notes (Signed)
Patient ready for discharge, assessments remained unchanged prior to d/c. Discharge instructions given.

## 2014-10-31 NOTE — Progress Notes (Signed)
CSW faxed fl2 to facility.  Patient will discharge to [facility] Anticipated discharge date:10/31/14 Transportation by group home worker- will come between 12-1pm  CSW signing off.  Domenica Reamer, [place] Social Worker [phone]

## 2014-10-31 NOTE — Progress Notes (Signed)
      [address]       [place] [address]             [phone]      5 Days Post-Op Procedure(s) (LRB): LEFT VIDEO ASSISTED THORACOSCOPY (Left) LEFT LOWER LOBE SUPERIOR SEGMENTECTOMY (Left) LYMPH NODE DISSECTION (Left) Subjective: Feels fine  Objective: Vital signs in last 24 hours: Temp:  [97.8 F (36.6 C)-98.6 F (37 C)] 97.8 F (36.6 C) (08/15 0416) Pulse Rate:  [74-113] 74 (08/15 0416) Cardiac Rhythm:  [-] Sinus tachycardia (08/14 2045) Resp:  [16-18] 18 (08/15 0416) BP: (145-178)/(77-93) 178/77 mmHg (08/15 0416) SpO2:  [97 %-100 %] 99 % (08/15 0723)  Hemodynamic parameters for last 24 hours:    Intake/Output from previous day: 08/14 0701 - 08/15 0700 In: 1560 [P.O.:1560] Out: 500 [Stool:500] Intake/Output this shift:    General appearance: alert, cooperative and no distress Heart: regular rate and rhythm Lungs: clear to auscultation bilaterally Abdomen: benign Extremities: no edema Wound: incis healing well  Lab Results: No results for input(s): WBC, HGB, HCT, PLT in the last 72 hours. BMET: No results for input(s): NA, K, CL, CO2, GLUCOSE, BUN, CREATININE, CALCIUM in the last 72 hours.  PT/INR: No results for input(s): LABPROT, INR in the last 72 hours. ABG    Component Value Date/Time   PHART 7.377 10/27/2014 0434   HCO3 24.2* 10/27/2014 0434   TCO2 25.5 10/27/2014 0434   ACIDBASEDEF 0.3 10/27/2014 0434   O2SAT 98.6 10/27/2014 0434   CBG (last 3)   Recent Labs  10/30/14 1648 10/30/14 2139 10/31/14 0652  GLUCAP 118* 135* 100*    Meds Scheduled Meds: . acetaminophen  1,000 mg Oral 4 times per day   Or  . acetaminophen (TYLENOL) oral liquid 160 mg/5 mL  1,000 mg Oral 4 times per day  . amLODipine  10 mg Oral q morning - 10a  . aspirin EC  81 mg Oral Daily  . bisacodyl  10 mg Oral Daily  . feeding supplement  1 Container Oral TID BM  . ferrous sulfate  325 mg Oral BID WC  . fish oil-omega-3 fatty acids  1 g Oral BID    . guaiFENesin  1,200 mg Oral BID  . insulin aspart  0-15 Units Subcutaneous TID WC  . metFORMIN  1,000 mg Oral BID WC  . metoprolol succinate  100 mg Oral QHS  . mometasone-formoterol  2 puff Inhalation BID  . montelukast  10 mg Oral QHS  . pantoprazole  40 mg Oral q morning - 10a  . phenytoin  100 mg Oral BID  . pneumococcal 23 valent vaccine  0.5 mL Intramuscular Once  . pravastatin  40 mg Oral QPM  . senna-docusate  1 tablet Oral QHS   Continuous Infusions:  PRN Meds:.benzonatate, fentaNYL (SUBLIMAZE) injection, ondansetron (ZOFRAN) IV, oxyCODONE, potassium chloride, sodium chloride, traMADol  Xrays No results found.  Assessment/Plan: S/P Procedure(s) (LRB): LEFT VIDEO ASSISTED THORACOSCOPY (Left) LEFT LOWER LOBE SUPERIOR SEGMENTECTOMY (Left) LYMPH NODE DISSECTION (Left)  1 conts to do well 2 HTN persists- will add low dose ace inhib 3 transfer to group home    LOS: 5 days    GOLD,WAYNE E 10/31/2014

## 2014-11-03 ENCOUNTER — Encounter (HOSPITAL_COMMUNITY): Payer: Self-pay

## 2014-11-04 ENCOUNTER — Ambulatory Visit (INDEPENDENT_AMBULATORY_CARE_PROVIDER_SITE_OTHER): Payer: Self-pay | Admitting: *Deleted

## 2014-11-04 DIAGNOSIS — R911 Solitary pulmonary nodule: Secondary | ICD-10-CM

## 2014-11-04 DIAGNOSIS — Z85048 Personal history of other malignant neoplasm of rectum, rectosigmoid junction, and anus: Secondary | ICD-10-CM

## 2014-11-04 DIAGNOSIS — C3432 Malignant neoplasm of lower lobe, left bronchus or lung: Secondary | ICD-10-CM

## 2014-11-04 NOTE — Progress Notes (Signed)
[name] returns s/p LLLobectomy for suture removal of one previous chest tube site.  This was easily done. This site as well as the mini VATS thoracotomy incision are very well healed.  She is accompanied by her health care worker from her nursing home.  She will return as scheduled with a cxr.

## 2014-11-11 ENCOUNTER — Encounter (HOSPITAL_COMMUNITY): Payer: Self-pay | Admitting: Hematology & Oncology

## 2014-11-14 ENCOUNTER — Other Ambulatory Visit: Payer: Self-pay | Admitting: Thoracic Surgery (Cardiothoracic Vascular Surgery)

## 2014-11-14 DIAGNOSIS — R918 Other nonspecific abnormal finding of lung field: Secondary | ICD-10-CM

## 2014-11-15 ENCOUNTER — Ambulatory Visit (INDEPENDENT_AMBULATORY_CARE_PROVIDER_SITE_OTHER): Payer: Self-pay | Admitting: Thoracic Surgery (Cardiothoracic Vascular Surgery)

## 2014-11-15 ENCOUNTER — Encounter: Payer: Self-pay | Admitting: Thoracic Surgery (Cardiothoracic Vascular Surgery)

## 2014-11-15 ENCOUNTER — Ambulatory Visit
Admission: RE | Admit: 2014-11-15 | Discharge: 2014-11-15 | Disposition: A | Discharge status: Home / Self Care | Payer: Medicare Other | Source: Ambulatory Visit | Attending: Thoracic Surgery (Cardiothoracic Vascular Surgery) | Admitting: Thoracic Surgery (Cardiothoracic Vascular Surgery)

## 2014-11-15 VITALS — BP 158/86 | HR 96 | Resp 20 | Ht 64.0 in

## 2014-11-15 DIAGNOSIS — C3492 Malignant neoplasm of unspecified part of left bronchus or lung: Secondary | ICD-10-CM

## 2014-11-15 DIAGNOSIS — R918 Other nonspecific abnormal finding of lung field: Secondary | ICD-10-CM

## 2014-11-15 NOTE — Progress Notes (Signed)
[address]       [place] [address]             [phone]       HPI:  [name] returns today for scheduled postoperative follow-up visit.  She is a 64 year old woman with a history of rectal cancer who had a thoracoscopic left lower lobe superior segmentectomy on 10/26/2014. 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 Her postoperative course was uncomplicated and she was discharged on day 5.  She says she feels well. She has a little soreness but does not have to take anything for it. She hasn't noted any shortness of breath.  Past Medical History  Diagnosis Date  . Hypertension   . Diabetes mellitus     years  . Asthma   . Depression   . Mental retardation     stopped school at 9th grade   . History of recurrent UTIs   . Shortness of breath     with exertion  . Cancer     Rectal   . Arthritis   . Gout   . Seizures     more than 4 yrs since last seizure. UNknown etiology  . Rectal cancer   . Iron deficiency 04/27/2014  . Chronic renal disease, stage 3, moderately decreased glomerular filtration rate between 30-59 mL/min/1.73 square meter 04/27/2014  . Numbness and tingling in hands     x several months   . GERD (gastroesophageal reflux disease)      Current Outpatient Prescriptions  Medication Sig Dispense Refill  . amLODipine (NORVASC) 10 MG tablet Take 1 tablet (10 mg total) by mouth every morning. 30 tablet 1  . aspirin EC 81 MG tablet Take 81 mg by mouth daily.    [name] docusate sodium (COLACE) 100 MG capsule Take 100 mg by mouth 2 (two) times daily.    . Emollient (CERAVE) LOTN Apply 1 application topically daily. Apply to both feet    . ferrous sulfate 325 (65 FE) MG tablet TAKE (1) TABLET BY MOUTH TWICE DAILY. 60 tablet 1  . fish oil-omega-3 fatty acids 1000 MG capsule Take 1 g by mouth 2 (two) times daily.    . Fluticasone-Salmeterol (ADVAIR) 250-50 MCG/DOSE AEPB Inhale 1 puff into the lungs every 12 (twelve)  hours.    [name] glucose blood (ACCU-CHEK AVIVA PLUS) test strip 1 each by Other route daily. Use as instructed    . HYDROcodone-acetaminophen (NORCO/VICODIN) 5-325 MG per tablet Take 1 tablet by mouth every 4 (four) hours as needed for moderate pain. For pain not relieved by tramadol 40 tablet 0  . ibuprofen (ADVIL,MOTRIN) 600 MG tablet Take 600 mg by mouth every 6 (six) hours as needed for moderate pain.    . Insulin Glargine (LANTUS SOLOSTAR) 100 UNIT/ML SOPN Inject 12 Units into the skin at bedtime.     [name] lisinopril (PRINIVIL,ZESTRIL) 10 MG tablet Take 1 tablet (10 mg total) by mouth daily. 30 tablet 1  . loratadine-pseudoephedrine (LORATADINE-D 24HR) 10-240 MG per 24 hr tablet Take 1 tablet by mouth daily.    . metFORMIN (GLUCOPHAGE) 1000 MG tablet Take 1,000 mg by mouth 2 (two) times daily with a meal.    . metoprolol succinate (TOPROL-XL) 50 MG 24 hr tablet Take 100 mg by mouth at bedtime.     . montelukast (SINGULAIR) 10 MG tablet Take 10 mg by mouth at bedtime.    . pantoprazole (PROTONIX) 40 MG tablet Take  40 mg by mouth every morning.     . phenytoin (DILANTIN) 100 MG ER capsule Take 100 mg by mouth 2 (two) times daily.    . pravastatin (PRAVACHOL) 40 MG tablet Take 40 mg by mouth every evening.     . silver sulfADIAZINE (SILVADENE) 1 % cream Apply 1 application topically every morning.     . sodium chloride (OCEAN) 0.65 % SOLN nasal spray Place 2 sprays into both nostrils 4 (four) times daily as needed for congestion.    . traMADol (ULTRAM) 50 MG tablet Take 50 mg by mouth every 4 (four) hours as needed for moderate pain.     [name] triamcinolone cream (KENALOG) 0.1 % Apply 1 application topically 2 (two) times daily. To stoma     No current facility-administered medications for this visit.    Physical Exam BP 158/86 mmHg  Pulse 96  Resp 20  Ht '5\' 4"'$  (1.626 m)  SpO42 38% 64 year old woman in no acute distress Alert Incisions healing well Lungs clear with breath sounds  bilaterally  Diagnostic Tests: I personally reviewed her chest x-ray. Shows postoperative changes. There is no significant effusion.  Impression: 64 year old woman who is now 3 weeks out from a left lower lobe superior segmentectomy for an isolated rectal cancer metastasis. She is doing well from a surgical standpoint. There are no restrictions on her activities.  She will need to follow up with Kirby Crigler and Dr. Whitney Muse in [place].  I will have my office call and arrange that appointment if one is not already in place.  Plan: Follow up at [place] [facility].   I will be happy to see her back at any time if I can be of any further assistance with her care  [name], MD [facility] [phone]

## 2014-11-17 ENCOUNTER — Encounter (HOSPITAL_COMMUNITY): Payer: Self-pay | Admitting: Emergency Medicine

## 2014-11-17 ENCOUNTER — Emergency Department (HOSPITAL_COMMUNITY)
Admission: EM | Admit: 2014-11-17 | Discharge: 2014-11-17 | Disposition: A | Discharge status: Home / Self Care | Payer: Medicare Other | Attending: Emergency Medicine | Admitting: Emergency Medicine

## 2014-11-17 DIAGNOSIS — I129 Hypertensive chronic kidney disease with stage 1 through stage 4 chronic kidney disease, or unspecified chronic kidney disease: Secondary | ICD-10-CM | POA: Diagnosis not present

## 2014-11-17 DIAGNOSIS — Y998 Other external cause status: Secondary | ICD-10-CM | POA: Diagnosis not present

## 2014-11-17 DIAGNOSIS — K219 Gastro-esophageal reflux disease without esophagitis: Secondary | ICD-10-CM | POA: Insufficient documentation

## 2014-11-17 DIAGNOSIS — Z792 Long term (current) use of antibiotics: Secondary | ICD-10-CM | POA: Insufficient documentation

## 2014-11-17 DIAGNOSIS — T783XXA Angioneurotic edema, initial encounter: Secondary | ICD-10-CM | POA: Insufficient documentation

## 2014-11-17 DIAGNOSIS — Z794 Long term (current) use of insulin: Secondary | ICD-10-CM | POA: Insufficient documentation

## 2014-11-17 DIAGNOSIS — Z79899 Other long term (current) drug therapy: Secondary | ICD-10-CM | POA: Diagnosis not present

## 2014-11-17 DIAGNOSIS — M1990 Unspecified osteoarthritis, unspecified site: Secondary | ICD-10-CM | POA: Diagnosis not present

## 2014-11-17 DIAGNOSIS — X58XXXA Exposure to other specified factors, initial encounter: Secondary | ICD-10-CM | POA: Insufficient documentation

## 2014-11-17 DIAGNOSIS — J45909 Unspecified asthma, uncomplicated: Secondary | ICD-10-CM | POA: Diagnosis not present

## 2014-11-17 DIAGNOSIS — Z7952 Long term (current) use of systemic steroids: Secondary | ICD-10-CM | POA: Insufficient documentation

## 2014-11-17 DIAGNOSIS — R220 Localized swelling, mass and lump, head: Secondary | ICD-10-CM | POA: Diagnosis present

## 2014-11-17 DIAGNOSIS — Z7951 Long term (current) use of inhaled steroids: Secondary | ICD-10-CM | POA: Diagnosis not present

## 2014-11-17 DIAGNOSIS — E611 Iron deficiency: Secondary | ICD-10-CM | POA: Insufficient documentation

## 2014-11-17 DIAGNOSIS — Z87440 Personal history of urinary (tract) infections: Secondary | ICD-10-CM | POA: Insufficient documentation

## 2014-11-17 DIAGNOSIS — M109 Gout, unspecified: Secondary | ICD-10-CM | POA: Diagnosis not present

## 2014-11-17 DIAGNOSIS — Y9389 Activity, other specified: Secondary | ICD-10-CM | POA: Insufficient documentation

## 2014-11-17 DIAGNOSIS — Z85048 Personal history of other malignant neoplasm of rectum, rectosigmoid junction, and anus: Secondary | ICD-10-CM | POA: Insufficient documentation

## 2014-11-17 DIAGNOSIS — E119 Type 2 diabetes mellitus without complications: Secondary | ICD-10-CM | POA: Insufficient documentation

## 2014-11-17 DIAGNOSIS — N183 Chronic kidney disease, stage 3 (moderate): Secondary | ICD-10-CM | POA: Insufficient documentation

## 2014-11-17 DIAGNOSIS — F329 Major depressive disorder, single episode, unspecified: Secondary | ICD-10-CM | POA: Insufficient documentation

## 2014-11-17 DIAGNOSIS — Y9289 Other specified places as the place of occurrence of the external cause: Secondary | ICD-10-CM | POA: Insufficient documentation

## 2014-11-17 DIAGNOSIS — Z7982 Long term (current) use of aspirin: Secondary | ICD-10-CM | POA: Insufficient documentation

## 2014-11-17 MED ORDER — DIPHENHYDRAMINE HCL 25 MG PO CAPS
25.0000 mg | ORAL_CAPSULE | Freq: Once | ORAL | Status: AC
  Administered 2014-11-17: 25 mg via ORAL
  Filled 2014-11-17: qty 1

## 2014-11-17 MED ORDER — FAMOTIDINE 20 MG PO TABS: 20.0000 mg | ORAL_TABLET | Freq: Two times a day (BID) | ORAL | Status: DC

## 2014-11-17 MED ORDER — FAMOTIDINE 20 MG PO TABS
20.0000 mg | ORAL_TABLET | Freq: Once | ORAL | Status: AC
  Administered 2014-11-17: 20 mg via ORAL
  Filled 2014-11-17: qty 1

## 2014-11-17 MED ORDER — PREDNISONE 50 MG PO TABS
60.0000 mg | ORAL_TABLET | Freq: Once | ORAL | Status: AC
  Administered 2014-11-17: 60 mg via ORAL
  Filled 2014-11-17 (×2): qty 1

## 2014-11-17 MED ORDER — DIPHENHYDRAMINE HCL 25 MG PO TABS: 25.0000 mg | ORAL_TABLET | Freq: Four times a day (QID) | ORAL | Status: DC

## 2014-11-17 MED ORDER — PREDNISONE 10 MG PO TABS: 40.0000 mg | ORAL_TABLET | Freq: Every day | ORAL | Status: DC

## 2014-11-17 NOTE — ED Provider Notes (Signed)
CSN: [id number]     Arrival date & time 11/17/14  1804 History   First MD Initiated Contact with Patient 11/17/14 1814     Chief Complaint  Patient presents with  . Oral Swelling     (Consider location/radiation/quality/duration/timing/severity/associated sxs/prior Treatment) The history is provided by the patient.   64 year old female sent in for left lower lip swelling that occurred while eating dinner. Questionable food allergy no wheezing no hives no tongue swelling no facial swelling. Patient is also on lisinopril so it's possible this could be reaction from that. Presentation consistent with a mild angioedema. Patient without a history of any food allergies. Patient has not had any lip swelling in the past.  Past Medical History  Diagnosis Date  . Hypertension   . Diabetes mellitus     years  . Asthma   . Depression   . Mental retardation     stopped school at 9th grade   . History of recurrent UTIs   . Shortness of breath     with exertion  . Cancer     Rectal   . Arthritis   . Gout   . Seizures     more than 4 yrs since last seizure. UNknown etiology  . Rectal cancer   . Iron deficiency 04/27/2014  . Chronic renal disease, stage 3, moderately decreased glomerular filtration rate between 30-59 mL/min/1.73 square meter 04/27/2014  . Numbness and tingling in hands     x several months   . GERD (gastroesophageal reflux disease)    Past Surgical History  Procedure Laterality Date  . Abdominal hysterectomy    . Multiple extractions with alveoloplasty N/A 11/23/2012    Procedure: MULTIPLE EXTRACION 1, 2, 4, 5, 6, 7, 8, 9, 10, 11, 12, 13, 14, 17, 18, 20, 23, 24, 25, 26, 28, 29, 32 WITH ALVEOLOPLASTY, REMOVE BILATERAL TORI;  Surgeon: Gae Bon, DDS;  Location: [facility];  Service: Oral Surgery;  Laterality: N/A;  . Colonoscopy with esophagogastroduodenoscopy (egd) N/A 01/14/2013    Procedure: COLONOSCOPY WITH ESOPHAGOGASTRODUODENOSCOPY (EGD);  Surgeon: Rogene Houston, MD;   [facility];  Service: Endoscopy;  Laterality: N/A;  250-moved to 315 Ann to notify pt  . Colonoscopy N/A 02/04/2013    Procedure: COLONOSCOPY;  Surgeon: Rogene Houston, MD;  [facility];  Service: Endoscopy;  Laterality: N/A;  225  . Eus N/A 02/18/2013    Procedure: LOWER ENDOSCOPIC ULTRASOUND (EUS);  Surgeon: Milus Banister, MD;  Location: [facility];  Service: Endoscopy;  Laterality: N/A;  . Flexible sigmoidoscopy N/A 07/26/2013    Procedure: FLEXIBLE SIGMOIDOSCOPY;  Surgeon: Jamesetta So, MD;  Location: [facility];  Service: General;  Laterality: N/A;  . Abdominal perineal bowel resection N/A 07/26/2013    Procedure:  ABDOMINAL PERINEAL RESECTION;  Surgeon: Jamesetta So, MD;  Location: [facility];  Service: General;  Laterality: N/A;  . Supracervical abdominal hysterectomy N/A 07/26/2013    Procedure: HYSTERECTOMY SUPRACERVICAL ABDOMINAL ;  Surgeon: Jamesetta So, MD;  Location: [facility];  Service: General;  Laterality: N/A;  . Salpingoophorectomy Bilateral 07/26/2013    Procedure: SALPINGO OOPHORECTOMY;  Surgeon: Jamesetta So, MD;  [facility]: [facility];  Service: General;  Laterality: Bilateral;  . Colostomy Left 07/26/2013    Procedure: COLOSTOMY;  Surgeon: Jamesetta So, MD;  Location[facility];  Service: General;  Laterality: Left;  . Colonoscopy N/A 05/12/2014    Procedure: COLONOSCOPY;  Surgeon: Rogene Houston, MD;  [facility];  Service: Endoscopy;  Laterality: N/A;  1030  . Colon surgery  2015    bowel resection/w colostomy  . Video assisted thoracoscopy Left 10/26/2014    Procedure: LEFT VIDEO ASSISTED THORACOSCOPY;  Surgeon: [name], MD;  Location: [facility];  Service: Thoracic;  Laterality: Left;  . Segmentecomy Left 10/26/2014    Procedure: LEFT LOWER LOBE SUPERIOR SEGMENTECTOMY;  Surgeon: [name], MD;  Location: [facility];  Service: Thoracic;  Laterality: Left;  . Lymph node dissection Left 10/26/2014    Procedure: LYMPH NODE  DISSECTION;  Surgeon: [name], MD;  Location: [facility];  Service: Thoracic;  Laterality: Left;   History reviewed. No pertinent family history. Social History  Substance Use Topics  . Smoking status: Former Smoker    Types: Cigarettes    Quit date: 07/16/2008  . Smokeless tobacco: Never Used     Comment: unknown when stopped smoking      long time  . Alcohol Use: No   OB History    No data available     Review of Systems  Constitutional: Negative for fever.  HENT: Negative for congestion, facial swelling and trouble swallowing.   Eyes: Negative for visual disturbance.  Respiratory: Negative for shortness of breath and wheezing.   Cardiovascular: Negative for chest pain.  Gastrointestinal: Negative for nausea, vomiting, abdominal pain and diarrhea.  Genitourinary: Negative for dysuria.  Musculoskeletal: Negative for back pain.  Skin: Negative for rash.  Neurological: Negative for speech difficulty and headaches.  Hematological: Does not bruise/bleed easily.  Psychiatric/Behavioral: Negative for confusion.      Allergies  Review of patient's allergies indicates no known allergies.  Home Medications   Prior to Admission medications   Medication Sig Start Date End Date Taking? Authorizing Provider  amLODipine (NORVASC) 10 MG tablet Take 1 tablet (10 mg total) by mouth every morning. 10/31/14  Yes John Giovanni, PA-C  aspirin EC 81 MG tablet Take 81 mg by mouth daily.   Yes Historical Provider, MD  docusate sodium (COLACE) 100 MG capsule Take 100 mg by mouth 2 (two) times daily.   Yes Historical Provider, MD  Emollient (CERAVE) LOTN Apply 1 application topically daily. Apply to both feet   Yes Historical Provider, MD  ferrous sulfate 325 (65 FE) MG tablet TAKE (1) TABLET BY MOUTH TWICE DAILY. 09/23/14  Yes Rogene Houston, MD  fish oil-omega-3 fatty acids 1000 MG capsule Take 1 g by mouth 2 (two) times daily.   Yes Historical Provider, MD  Fluticasone-Salmeterol  (ADVAIR) 250-50 MCG/DOSE AEPB Inhale 1 puff into the lungs every 12 (twelve) hours.   Yes Historical Provider, MD  Insulin Glargine (LANTUS SOLOSTAR) 100 UNIT/ML SOPN Inject 12 Units into the skin at bedtime.    Yes Historical Provider, MD  lisinopril (PRINIVIL,ZESTRIL) 10 MG tablet Take 1 tablet (10 mg total) by mouth daily. 10/31/14  Yes Wayne E Gold, PA-C  loratadine-pseudoephedrine (LORATADINE-D 24HR) 10-240 MG per 24 hr tablet Take 1 tablet by mouth daily.   Yes Historical Provider, MD  metFORMIN (GLUCOPHAGE) 1000 MG tablet Take 1,000 mg by mouth 2 (two) times daily with a meal.   Yes Historical Provider, MD  metoprolol succinate (TOPROL-XL) 50 MG 24 hr tablet Take 100 mg by mouth at bedtime.    Yes Historical Provider, MD  montelukast (SINGULAIR) 10 MG tablet Take 10 mg by mouth at bedtime.   Yes Historical Provider, MD  pantoprazole (PROTONIX) 40 MG tablet Take 40 mg by  mouth every morning.    Yes Historical Provider, MD  phenytoin (DILANTIN) 100 MG ER capsule Take 100 mg by mouth 2 (two) times daily.   Yes Historical Provider, MD  pravastatin (PRAVACHOL) 40 MG tablet Take 40 mg by mouth every evening.    Yes Historical Provider, MD  silver sulfADIAZINE (SILVADENE) 1 % cream Apply 1 application topically every morning.    Yes Historical Provider, MD  triamcinolone cream (KENALOG) 0.1 % Apply 1 application topically 2 (two) times daily. To stoma   Yes Historical Provider, MD  diphenhydrAMINE (BENADRYL) 25 MG tablet Take 1 tablet (25 mg total) by mouth every 6 (six) hours. 11/17/14   Fredia Sorrow, MD  famotidine (PEPCID) 20 MG tablet Take 1 tablet (20 mg total) by mouth 2 (two) times daily. 11/17/14   Fredia Sorrow, MD  glucose blood (ACCU-CHEK AVIVA PLUS) test strip 1 each by Other route daily. Use as instructed    Historical Provider, MD  HYDROcodone-acetaminophen (NORCO/VICODIN) 5-325 MG per tablet Take 1 tablet by mouth every 4 (four) hours as needed for moderate pain. For pain not relieved  by tramadol 10/29/14   Wayne E Gold, PA-C  ibuprofen (ADVIL,MOTRIN) 600 MG tablet Take 600 mg by mouth every 6 (six) hours as needed for moderate pain.    Historical Provider, MD  predniSONE (DELTASONE) 10 MG tablet Take 4 tablets (40 mg total) by mouth daily. 11/17/14   Fredia Sorrow, MD  sodium chloride (OCEAN) 0.65 % SOLN nasal spray Place 2 sprays into both nostrils 4 (four) times daily as needed for congestion.    Historical Provider, MD  traMADol (ULTRAM) 50 MG tablet Take 50 mg by mouth every 4 (four) hours as needed for moderate pain.     Historical Provider, MD   BP 169/75 mmHg  Pulse 113  Temp(Src) 98.2 F (36.8 C) (Oral)  Resp 20  Ht '5\' 4"'$  (1.626 m)  Wt 230 lb (104.327 kg)  BMI 39.46 kg/m2  SpO2 99% Physical Exam  Constitutional: She appears well-developed and well-nourished. No distress.  HENT:  Head: Normocephalic and atraumatic.  Mouth/Throat: Oropharynx is clear and moist.  Swelling to the left lower lip no tenderness no tongue swelling no trouble swallowing.  Eyes: Conjunctivae and EOM are normal. Pupils are equal, round, and reactive to light.  Neck: Normal range of motion.  Cardiovascular: Normal rate, regular rhythm and normal heart sounds.   Pulmonary/Chest: Effort normal and breath sounds normal. No respiratory distress. She has no wheezes.  Abdominal: Soft. Bowel sounds are normal. There is no tenderness.  Musculoskeletal: Normal range of motion.  Neurological: She is alert. No cranial nerve deficit. She exhibits normal muscle tone. Coordination normal.  Skin: Skin is warm. No rash noted.  Nursing note and vitals reviewed.   ED Course  Procedures (including critical care time) Labs Review Labs Reviewed - No data to display  Imaging Review No results found. I have personally reviewed and evaluated these images and lab results as part of my medical decision-making.   EKG Interpretation None      MDM   Final diagnoses:  Lip swelling  Angioedema of  lips, initial encounter    Patient with swelling to the left lower lip no tongue swelling no facial swelling no upper lip swelling no trouble breathing. This may represent food allergy since occurred with eating or could be related to her lisinopril and therefore more of an angioedema picture. Patient observed here no worsening of the lip swelling no significant improvement  with prednisone Pepcid and Benadryl. Patient is stable for discharge home able to swallow liquids fine. Patient will be continued on Benadryl Pepcid and prednisone and also recommend follow-up with primary care doctor to see if it may want to stop lisinopril which is one of her blood pressure medicines that could be responsible for this.    Fredia Sorrow, MD 11/17/14 2008

## 2014-11-17 NOTE — ED Notes (Signed)
Pt states lip started swelling after eating Kiwi and chicken pot pie at supper today. Lower lip at left side swelling. Pt states she has no problems swallowing and denies pain. Nad.

## 2014-11-17 NOTE — ED Notes (Signed)
Patient waiting on transpotation

## 2014-11-17 NOTE — Discharge Instructions (Signed)
Lip swelling could be an allergic reaction or could be due to one of her blood pressure medicines the lisinopril. Return for any tongue swelling or worse swelling of the lips. Return for any trouble breathing. Would make an appointment to follow-up with your doctor in the next few days. They may want to stop the lisinopril blood pressure medicine. In the meantime take this on his own as directed for the next 5 days. Take Benadryl every 6 hours for the next 2 days and take the Pepcid for the next 7 days.

## 2014-11-24 ENCOUNTER — Ambulatory Visit (HOSPITAL_COMMUNITY): Payer: Medicare Other | Admitting: Hematology & Oncology

## 2014-11-25 ENCOUNTER — Encounter (HOSPITAL_COMMUNITY): Payer: Medicare Other | Attending: Hematology and Oncology | Admitting: Hematology & Oncology

## 2014-11-25 ENCOUNTER — Other Ambulatory Visit (INDEPENDENT_AMBULATORY_CARE_PROVIDER_SITE_OTHER): Payer: Self-pay | Admitting: Internal Medicine

## 2014-11-25 ENCOUNTER — Encounter (HOSPITAL_COMMUNITY): Payer: Self-pay | Admitting: Hematology & Oncology

## 2014-11-25 VITALS — BP 142/60 | HR 88 | Temp 98.1°F | Resp 18

## 2014-11-25 DIAGNOSIS — C20 Malignant neoplasm of rectum: Secondary | ICD-10-CM | POA: Insufficient documentation

## 2014-11-25 DIAGNOSIS — C7800 Secondary malignant neoplasm of unspecified lung: Secondary | ICD-10-CM | POA: Diagnosis not present

## 2014-11-25 DIAGNOSIS — D649 Anemia, unspecified: Secondary | ICD-10-CM | POA: Diagnosis not present

## 2014-11-25 NOTE — Progress Notes (Signed)
Jennifer Bogus, MD [address] [place] [address]  Locally advanced Rectal Cancer  CURRENT THERAPY: Observation  INTERVAL HISTORY: [name] 64 y.o. female returns for followup of Locally advanced adenocarcinoma of the rectum, tolerating combined modality therapy well. Radiation therapy completed on 05/04/2013 which was also the last day she received Xeloda, excellent tolerance, status post APR on 07/26/2013 with marked cytoreduction documented as evidenced by no lymph node involvement. pT3 tumor was found. S/P 6 months of Xeloda at 1000 mg BID 2 weeks on and 1 week off finishing in December 2015.   She has just undergone successful resection of a solitary pulmonary nodule, unfortunately pathology was consistent with recurrent colorectal cancer.  [name] is here today with two other women, one of whom drove her to her appointment today.  She states that she's been eating well, and sleeping "sometimes, part of the time." She denies any pain, she denies any shortness of breath. Her mood is her usual pleasant baseline.  She lives at [facility] off of [address]. [name]'s case worker also serves as her legal guardian. She was unable to attend the appointment today.     Rectal cancer   02/18/2013 Initial Diagnosis Rectal cancer   03/25/2013 - 05/05/2013 Radiation Therapy Pelvis treatment from 1/8- 2/12 with rectal boost from 2/13- 05/05/2013.   03/25/2013 - 05/05/2013 Chemotherapy Xeloda 1500 mg BID 5 days/week with radiation therapy.   07/26/2013 Definitive Surgery Dr. Arnoldo Morale- Flexible sigmoidoscopy, abdominoperineal resection, total abdominal hysterectomy with bilateral salpingo-oophorectomy   08/30/2013 - 03/01/2014 Chemotherapy Xeloda 1000 mg BID 14 days on and 7 days off x 6 months   03/08/2014 Imaging CT CAP- Bilateral pulmonary nodules, including an 8 mm left lower lobe nodule. Metastatic disease is a concern. The left lower lobe nodule has  progressed since 03/05/2013, when it measured 5 mm.   05/26/2014 Imaging CT Chest- Continued enlargement of the superior segment left lower lobe nodule. Malignancy is likely.   In contrast, the right lower lobe nodule is probably benign.   08/17/2014 Imaging CT- Superior segment left lower lobe pulmonary nodule measures stable since the most recent comparison study, but is again noted to have increased in size when comparing to older studies. Continued close attention will be required as neoplasm remains a con   10/26/2014 Surgery thoracoscopic left lower lobe superior segmentectomy. Pathology with metastatic adenocarcinoma c/w colonic primary       Past Medical History  Diagnosis Date  . Hypertension   . Diabetes mellitus     years  . Asthma   . Depression   . Mental retardation     stopped school at 9th grade   . History of recurrent UTIs   . Shortness of breath     with exertion  . Cancer     Rectal   . Arthritis   . Gout   . Seizures     more than 4 yrs since last seizure. UNknown etiology  . Rectal cancer   . Iron deficiency 04/27/2014  . Chronic renal disease, stage 3, moderately decreased glomerular filtration rate between 30-59 mL/min/1.73 square meter 04/27/2014  . Numbness and tingling in hands     x several months   . GERD (gastroesophageal reflux disease)     has DIABETES MELLITUS, TYPE II, UNCONTROLLED; HYPERLIPIDEMIA; GOUT NOS; Anemia; DEPRESSION; RETARDATION, MENTAL NOS; Essential hypertension; SINUS TACHYCARDIA; ALLERGIC RHINITIS; ASTHMA; ASTHMA, WITH ACUTE EXACERBATION; DISEASE, PANCREAS NOS; INTERTRIGO, CANDIDAL;  ARTHRITIS; SEIZURE DISORDER; UNSPECIFIED SLEEP DISTURBANCE; FLANK PAIN, LEFT; LIVER FUNCTION TESTS, ABNORMAL; MIGRAINES, HX OF; Elevated liver enzymes; Helicobacter pylori gastritis; Rectal cancer; Incidental pulmonary nodule, greater than or equal to 40m; Abdominal pain; Insomnia; Iron deficiency; Chronic renal disease, stage 3, moderately decreased glomerular  filtration rate between 30-59 mL/min/1.73 square meter; and Lung nodule on her problem list.     has No Known Allergies.  [name] not currently have medications on file.  Past Surgical History  Procedure Laterality Date  . Abdominal hysterectomy    . Multiple extractions with alveoloplasty N/A 11/23/2012    Procedure: MULTIPLE EXTRACION 1, 2, 4, 5, 6, 7, 8, 9, 10, 11, 12, 13, 14, 17, 18, 20, 23, 24, 25, 26, 28, 29, 32 WITH ALVEOLOPLASTY, REMOVE BILATERAL TORI;  Surgeon: SGae Bon DDS;  [facility]  Service: Oral Surgery;  Laterality: N/A;  . Colonoscopy with esophagogastroduodenoscopy (egd) N/A 01/14/2013    Procedure: COLONOSCOPY WITH ESOPHAGOGASTRODUODENOSCOPY (EGD);  Surgeon: NRogene Houston MD;  [facility];  Service: Endoscopy;  Laterality: N/A;  250-moved to 315 Ann to notify pt  . Colonoscopy N/A 02/04/2013    Procedure: COLONOSCOPY;  Surgeon: NRogene Houston MD;  [facility];  Service: Endoscopy;  Laterality: N/A;  225  . Eus N/A 02/18/2013    Procedure: LOWER ENDOSCOPIC ULTRASOUND (EUS);  Surgeon: DMilus Banister MD;  [facility];  Service: Endoscopy;  Laterality: N/A;  . Flexible sigmoidoscopy N/A 07/26/2013    Procedure: FLEXIBLE SIGMOIDOSCOPY;  Surgeon: MJamesetta So MD;  [facility];  Service: General;  Laterality: N/A;  . Abdominal perineal bowel resection N/A 07/26/2013    Procedure:  ABDOMINAL PERINEAL RESECTION;  Surgeon: MJamesetta So MD;  [facility];  Service: General;  Laterality: N/A;  . Supracervical abdominal hysterectomy N/A 07/26/2013    Procedure: HYSTERECTOMY SUPRACERVICAL ABDOMINAL ;  Surgeon: MJamesetta So MD;  [facility];  Service: General;  Laterality: N/A;  . Salpingoophorectomy Bilateral 07/26/2013    Procedure: SALPINGO OOPHORECTOMY;  Surgeon: MJamesetta So MD;  [facility];  Service: General;  Laterality: Bilateral;  . Colostomy Left 07/26/2013    Procedure: COLOSTOMY;   Surgeon: MJamesetta So MD;  [facility];  Service: General;  Laterality: Left;  . Colonoscopy N/A 05/12/2014    Procedure: COLONOSCOPY;  Surgeon: NRogene Houston MD;  [facility];  Service: Endoscopy;  Laterality: N/A;  1030  . Colon surgery  2015    bowel resection/w colostomy  . Video assisted thoracoscopy Left 10/26/2014    Procedure: LEFT VIDEO ASSISTED THORACOSCOPY;  Surgeon: SMelrose Nakayama MD;  [facility]  Service: Thoracic;  Laterality: Left;  . Segmentecomy Left 10/26/2014    Procedure: LEFT LOWER LOBE SUPERIOR SEGMENTECTOMY;  Surgeon: SMelrose Nakayama MD;  [facility]  Service: Thoracic;  Laterality: Left;  . Lymph node dissection Left 10/26/2014    Procedure: LYMPH NODE DISSECTION;  Surgeon: SMelrose Nakayama MD;  [facility] [facility]  Service: Thoracic;  Laterality: Left;    Denies any headaches, dizziness, double vision, fevers, chills, night sweats, nausea, vomiting, diarrhea, constipation, chest pain, heart palpitations, shortness of breath, blood in stool, black tarry stool, urinary pain, urinary burning, urinary frequency, hematuria. 14 point review of systems was performed and is negative except as detailed under history of present illness and above  PHYSICAL EXAMINATION  ECOG PERFORMANCE STATUS: 1  Filed Vitals:   11/25/14 1420  BP: 142/60  Pulse: 88  Temp: 98.1 F (36.7 C)  Resp: 18    GENERAL:alert, no distress, well nourished, well developed, comfortable, cooperative, obese, smiling   Surgical sites are beautifully healed on her left side. SKIN: skin color, texture, turgor are normal, no rashes or significant lesions HEAD: Normocephalic, No masses, lesions, tenderness or abnormalities EYES: normal, PERRLA, EOMI, Conjunctiva are pink and non-injected EARS: External ears normal OROPHARYNX:lips, buccal mucosa, and tongue normal and mucous membranes are moist  NECK: supple, no adenopathy, thyroid normal size, non-tender,  without nodularity, no stridor, non-tender, trachea midline LYMPH:  no palpable lymphadenopathy BREAST:not examined LUNGS: clear to auscultation  HEART: regular rate & rhythm, no murmurs and no gallops ABDOMEN:abdomen soft, obese and normal bowel sounds BACK: Back symmetric, no curvature., No CVA tenderness EXTREMITIES:less then 2 second capillary refill, no joint deformities, effusion, or inflammation, no skin discoloration, no clubbing, no cyanosis  NEURO: alert & oriented x 3 with fluent speech, no focal motor/sensory deficits, gait normal   LABORATORY DATA: I have reviewed the results listed below  CBC    Component Value Date/Time   WBC 7.4 10/28/2014 0430   RBC 3.29* 10/28/2014 0430   RBC 3.84* 11/07/2009 1649   HGB 9.0* 10/28/2014 0430   HCT 28.3* 10/28/2014 0430   PLT 145* 10/28/2014 0430   MCV 86.0 10/28/2014 0430   MCH 27.4 10/28/2014 0430   MCHC 31.8 10/28/2014 0430   RDW 13.5 10/28/2014 0430   LYMPHSABS 0.6* 09/15/2014 1327   MONOABS 0.4 09/15/2014 1327   EOSABS 0.3 09/15/2014 1327   BASOSABS 0.0 09/15/2014 1327     PATHOLOGY REPORT OF SURGICAL PATHOLOGY FINAL DIAGNOSIS Diagnosis 1. Lung, wedge biopsy/resection, superior segment of left lower lobe - METASTATIC ADENOCARCINOMA, CONSISTENT WITH COLONIC PRIMARY, SPANNING 1.5 CM. - LYMPHOVASCULAR INVASION IS IDENTIFIED. - THE SURGICAL RESECTION MARGINS ARE NEGATIVE FOR CARCINOMA. - SEE COMMENT. 2. Lymph node, biopsy, 9 L - THERE IS NO EVIDENCE OF CARCINOMA IN 1 OF 1 LYMPH NODE (0/1). 3. Lymph node, biopsy, 9 L #2 - THERE IS NO EVIDENCE OF CARCINOMA IN 1 OF 1 LYMPH NODE (0/1). 4. Lymph node, biopsy, 11 L - THERE IS NO EVIDENCE OF CARCINOMA IN 1 OF 1 LYMPH NODE (0/1). 5. Lymph node, biopsy, 5 - THERE IS NO EVIDENCE OF CARCINOMA IN 1 OF 1 LYMPH NODE (0/1). Microscopic Comment 1. A block will be sent for KRAS testing and the results reported separately. Additional testing can be performed upon clinician request.  (JBK:ds 10/27/14) [name], Electronic Signature (Case signed 10/27/2014) Intraoperative    RADIOGRAPHIC STUDIES:  CLINICAL DATA: Subsequent treatment strategy for restaging rectal cancer. Enlarging pulmonary nodule.  EXAM: NUCLEAR MEDICINE PET SKULL BASE TO THIGH COMPARISON: PET of 03/05/2013. Chest CTs of 08/17/14 and 05/26/2014.  IMPRESSION: 1. Hypermetabolism corresponding to the superior segment left lower lobe lung nodule. This could represent a primary bronchogenic carcinoma or isolated metastatic lesion. Primary bronchogenic carcinomas slightly favored. 2. No evidence of hypermetabolic metastatic disease. 3. Left axillary node is small and demonstrates low-level non malignant range hypermetabolism. Favor reactive etiology. 4. Heterogeneous marrow density and metabolism. The CT appearance has been present back to 2010. Favor Paget's disease. An area of more focal hypermetabolism in the left side of the sacrum is nonspecific and without focal CT correlate. Metastatic disease is felt unlikely but cannot be entirely excluded. Recommend attention on follow-up.   Electronically Signed  By: Abigail Miyamoto M.D.  On: 10/07/2014 13:02  ASSESSMENT AND PLAN:  History of locally advanced rectal cancer Hypermetabolic pulmonary nodule Prior tobacco abuse Pagets disease of bone Anemia CKD  She has undergone successful surgical resection of the solitary pulmonary nodule. Unfortunately pathology was c/w recurrent CRC. Her healthcare POA is not here today. I am uncertain what is going to be the best option for the patient. I have concerns that giving her more aggressive chemotherapy regimens such as FOLFOX will cause toxicity and that the patient will not be able to express that. I will discuss this in detail with her healthcare power of attorney. We can opt for ongoing observation but her risk of relapse is of course significant. Studies regarding resection of  a solitary metastases in CRC are more limited to hepatic metastatic disease.  She had an APR for her rectal cancer. I do not see where she has had recent endoscopy and we will need to address this moving forward.  Last available mammogram is from 08/16/2013, this will also need to be addressed at follow-up.   She has a chronic anemia that has not been aggressive evaluated.  [phone] = phone number given to call out to Ms. Hagos's case worker, who is also her legal guardian. We will follow-up with Ms. Sabel in a couple of weeks, pending chat with Education officer, museum.  All questions were answered. The patient knows to call the clinic with any problems, questions or concerns. We can certainly see the patient much sooner if necessary.  This document serves as a record of services personally performed by Ancil Linsey, MD. It was created on her behalf by Toni Amend, a trained medical scribe. The creation of this record is based on the scribe's personal observations and the provider's statements to them. This document has been checked and approved by the attending provider.  I have reviewed the above documentation for accuracy and completeness, and I agree with the above.   Evlyn Amason Kristen 11/25/2014 5:20 PM

## 2014-11-25 NOTE — Patient Instructions (Signed)
[place] at [facility] Discharge Instructions  RECOMMENDATIONS MADE BY THE CONSULTANT AND ANY TEST RESULTS WILL BE SENT TO YOUR REFERRING PHYSICIAN.  1.  Return in 1 month with your [place].  Thank you for choosing [place] at [facility] to provide your oncology and hematology care.  To afford each patient quality time with our provider, please arrive at least 15 minutes before your scheduled appointment time.    You need to re-schedule your appointment should you arrive 10 or more minutes late.  We strive to give you quality time with our providers, and arriving late affects you and other patients whose appointments are after yours.  Also, if you no show three or more times for appointments you may be dismissed from the clinic at the providers discretion.     Again, thank you for choosing [facility].  Our hope is that these requests will decrease the amount of time that you wait before being seen by our physicians.       _____________________________________________________________  Should you have questions after your visit to [facility], please contact our office at [phone] between the hours of 8:30 a.m. and 4:30 p.m.  Voicemails left after 4:30 p.m. will not be returned until the following business day.  For prescription refill requests, have your pharmacy contact our office.

## 2014-12-05 ENCOUNTER — Other Ambulatory Visit (HOSPITAL_COMMUNITY): Payer: Self-pay | Admitting: Oncology

## 2014-12-27 ENCOUNTER — Encounter (HOSPITAL_COMMUNITY): Payer: Self-pay | Admitting: Hematology & Oncology

## 2014-12-27 ENCOUNTER — Encounter (HOSPITAL_COMMUNITY): Payer: Medicare Other | Attending: Hematology and Oncology | Admitting: Hematology & Oncology

## 2014-12-27 VITALS — BP 162/73 | Temp 98.6°F | Resp 18 | Wt 230.0 lb

## 2014-12-27 DIAGNOSIS — D649 Anemia, unspecified: Secondary | ICD-10-CM | POA: Diagnosis not present

## 2014-12-27 DIAGNOSIS — R911 Solitary pulmonary nodule: Secondary | ICD-10-CM

## 2014-12-27 DIAGNOSIS — N183 Chronic kidney disease, stage 3 unspecified: Secondary | ICD-10-CM

## 2014-12-27 DIAGNOSIS — C20 Malignant neoplasm of rectum: Secondary | ICD-10-CM | POA: Insufficient documentation

## 2014-12-27 DIAGNOSIS — E611 Iron deficiency: Secondary | ICD-10-CM

## 2014-12-27 DIAGNOSIS — C7800 Secondary malignant neoplasm of unspecified lung: Secondary | ICD-10-CM

## 2014-12-27 DIAGNOSIS — M899 Disorder of bone, unspecified: Secondary | ICD-10-CM

## 2014-12-27 NOTE — Progress Notes (Signed)
Jennifer Bogus, MD [place] [id number]  History of Locally advanced Rectal Cancer Recurrent disease with solitary pulmonary metastases, s/p resection  CURRENT THERAPY: Observation  INTERVAL HISTORY: [name] 64 y.o. female returns for followup of Locally advanced adenocarcinoma of the rectum, She tolerated combined modality therapy well. Radiation therapy completed on 05/04/2013 which was also the last day she received Xeloda, excellent tolerance, status post APR on 07/26/2013 with marked cytoreduction documented as evidenced by no lymph node involvement. pT3 tumor was found. S/P 6 months of Xeloda at 1000 mg BID 2 weeks on and 1 week off finishing in December 2015.   She has just undergone successful resection of a solitary pulmonary nodule, unfortunately pathology was consistent with recurrent colorectal cancer.  She lives at [facility] off of [address]. Ms. Lata's case worker also serves as her legal guardian. She was unable to attend the appointment today.   Today the patient is here with one of her guardian social workers.  She is filling in for the primary Education [name], museum.  The patient's diagnosis and treatment options were discussed. She is now 8 weeks out from surgery.  Her social worker notes that the patient is adament to proceed with treatment.  She states that she understands and will notify if she was having any symptoms during treatment.  She understands what she will experience during treatment.  [name], museum states that she has known the patient for many years and this is truly her wish to try additional therapy.  She feels well. She walked into the clinic today. She is in a good mood, somewhat more talkative than at her last visit. She is eating well. Weight is stable.    Rectal cancer ([place])   02/18/2013 Initial Diagnosis Rectal cancer   03/25/2013 - 05/05/2013 Radiation Therapy Pelvis treatment from 1/8- 2/12  with rectal boost from 2/13- 05/05/2013.   03/25/2013 - 05/05/2013 Chemotherapy Xeloda 1500 mg BID 5 days/week with radiation therapy.   07/26/2013 Definitive Surgery Dr. Arnoldo [name]- Flexible sigmoidoscopy, abdominoperineal resection, total abdominal hysterectomy with bilateral salpingo-oophorectomy   08/30/2013 - 03/01/2014 Chemotherapy Xeloda 1000 mg BID 14 days on and 7 days off x 6 months   03/08/2014 Imaging CT CAP- Bilateral pulmonary nodules, including an 8 mm left lower lobe nodule. Metastatic disease is a concern. The left lower lobe nodule has progressed since 03/05/2013, when it measured 5 mm.   05/26/2014 Imaging CT Chest- Continued enlargement of the superior segment left lower lobe nodule. Malignancy is likely.   In contrast, the right lower lobe nodule is probably benign.   08/17/2014 Imaging CT- Superior segment left lower lobe pulmonary nodule measures stable since the most recent comparison study, but is again noted to have increased in size when comparing to older studies. Continued close attention will be required as neoplasm remains a con   10/26/2014 Surgery thoracoscopic left lower lobe superior segmentectomy. Pathology with metastatic adenocarcinoma c/w colonic primary       Past Medical History  Diagnosis Date  . Hypertension   . Diabetes mellitus     years  . Asthma   . Depression   . Mental retardation     stopped school at 9th grade   . History of recurrent UTIs   . Shortness of breath     with exertion  . Cancer (HCC)     Rectal   . Arthritis   . Gout   .  Seizures ([place])     more than 4 yrs since last seizure. UNknown etiology  . Rectal cancer ([place])   . Iron deficiency 04/27/2014  . Chronic renal disease, stage 3, moderately decreased glomerular filtration rate between 30-59 mL/min/1.73 square meter 04/27/2014  . Numbness and tingling in hands     x several months   . GERD (gastroesophageal reflux disease)     has DIABETES MELLITUS, TYPE II, UNCONTROLLED;  HYPERLIPIDEMIA; GOUT NOS; Anemia; DEPRESSION; RETARDATION, MENTAL NOS; Essential hypertension; SINUS TACHYCARDIA; ALLERGIC RHINITIS; ASTHMA; ASTHMA, WITH ACUTE EXACERBATION; DISEASE, PANCREAS NOS; INTERTRIGO, CANDIDAL; ARTHRITIS; SEIZURE DISORDER; UNSPECIFIED SLEEP DISTURBANCE; FLANK PAIN, LEFT; LIVER FUNCTION TESTS, ABNORMAL; MIGRAINES, HX OF; Elevated liver enzymes; Helicobacter pylori gastritis; Rectal cancer ([place]); Incidental pulmonary nodule, greater than or equal to 31m; Abdominal pain; Insomnia; Iron deficiency; Chronic renal disease, stage 3, moderately decreased glomerular filtration rate between 30-59 mL/min/1.73 square meter; and Lung nodule on her problem list.     has No Known Allergies.  Ms. TDuffydoes not currently have medications on file.  Past Surgical History  Procedure Laterality Date  . Abdominal hysterectomy    . Multiple extractions with alveoloplasty N/A 11/23/2012    Procedure: MULTIPLE EXTRACION 1, 2, 4, 5, 6, 7, 8, 9, 10, 11, 12, 13, 14, 17, 18, 20, 23, 24, 25, 26, 28, 29, 32 WITH ALVEOLOPLASTY, REMOVE BILATERAL TORI;  Surgeon: SGae Bon DDS;  [facility]  Service: Oral Surgery;  Laterality: N/A;  . Colonoscopy with esophagogastroduodenoscopy (egd) N/A 01/14/2013    Procedure: COLONOSCOPY WITH ESOPHAGOGASTRODUODENOSCOPY (EGD);  Surgeon: NRogene Houston MD;  [facility];  Service: Endoscopy;  Laterality: N/A;  250-moved to 315 Ann to notify pt  . Colonoscopy N/A 02/04/2013    Procedure: COLONOSCOPY;  Surgeon: NRogene Houston MD;  [facility];  Service: Endoscopy;  Laterality: N/A;  225  . Eus N/A 02/18/2013    Procedure: LOWER ENDOSCOPIC ULTRASOUND (EUS);  Surgeon: DMilus Banister MD;  [facility];  Service: Endoscopy;  Laterality: N/A;  . Flexible sigmoidoscopy N/A 07/26/2013    Procedure: FLEXIBLE SIGMOIDOSCOPY;  Surgeon: MJamesetta So MD;  [facility];  Service: General;  Laterality: N/A;  . Abdominal perineal  bowel resection N/A 07/26/2013    Procedure:  ABDOMINAL PERINEAL RESECTION;  Surgeon: MJamesetta So MD;  [facility];  Service: General;  Laterality: N/A;  . Supracervical abdominal hysterectomy N/A 07/26/2013    Procedure: HYSTERECTOMY SUPRACERVICAL ABDOMINAL ;  Surgeon: MJamesetta So MD;  [facility];  Service: General;  Laterality: N/A;  . Salpingoophorectomy Bilateral 07/26/2013    Procedure: SALPINGO OOPHORECTOMY;  Surgeon: MJamesetta So MD;  [facility];  Service: General;  Laterality: Bilateral;  . Colostomy Left 07/26/2013    Procedure: COLOSTOMY;  Surgeon: MJamesetta So MD;  [facility];  Service: General;  Laterality: Left;  . Colonoscopy N/A 05/12/2014    Procedure: COLONOSCOPY;  Surgeon: NRogene Houston MD;  [facility];  Service: Endoscopy;  Laterality: N/A;  1030  . Colon surgery  2015    bowel resection/w colostomy  . Video assisted thoracoscopy Left 10/26/2014    Procedure: LEFT VIDEO ASSISTED THORACOSCOPY;  Surgeon: SMelrose Nakayama MD;  [facility]  Service: Thoracic;  Laterality: Left;  . Segmentecomy Left 10/26/2014    Procedure: LEFT LOWER LOBE SUPERIOR SEGMENTECTOMY;  Surgeon: SMelrose Nakayama MD;  [facility]  Service: Thoracic;  Laterality: Left;  .  Lymph node dissection Left 10/26/2014    Procedure: LYMPH NODE DISSECTION;  Surgeon: [name], MD;  Location: [facility];  Service: Thoracic;  Laterality: Left;    Denies any headaches, dizziness, double vision, fevers, chills, night sweats, nausea, vomiting, diarrhea, constipation, chest pain, heart palpitations, shortness of breath, blood in stool, black tarry stool, urinary pain, urinary burning, urinary frequency, hematuria. 14 point review of systems was performed and is negative except as detailed under history of present illness and above  PHYSICAL EXAMINATION  ECOG PERFORMANCE STATUS: 1  Filed Vitals:   12/27/14 1348  BP: 162/73  Temp: 98.6 F (37 C)   Resp: 18    GENERAL:alert, no distress, well nourished, well developed, comfortable, cooperative, obese, smiling   Surgical sites are beautifully healed on her left side. SKIN: skin color, texture, turgor are normal, no rashes or significant lesions HEAD: Normocephalic, No masses, lesions, tenderness or abnormalities EYES: normal, PERRLA, EOMI, Conjunctiva are pink and non-injected EARS: External ears normal OROPHARYNX:lips, buccal mucosa, and tongue normal and mucous membranes are moist  NECK: supple, no adenopathy, thyroid normal size, non-tender, without nodularity, no stridor, non-tender, trachea midline LYMPH:  no palpable lymphadenopathy BREAST:not examined LUNGS: clear to auscultation  HEART: regular rate & rhythm, no murmurs and no gallops ABDOMEN:abdomen soft, obese and normal bowel sounds BACK: Back symmetric, no curvature., No CVA tenderness EXTREMITIES:less then 2 second capillary refill, no joint deformities, effusion, or inflammation, no skin discoloration, no clubbing, no cyanosis  NEURO: alert & oriented x 3 with fluent speech, no focal motor/sensory deficits, gait normal   LABORATORY DATA: I have reviewed the results listed below  CBC    Component Value Date/Time   WBC 7.4 10/28/2014 0430   RBC 3.29* 10/28/2014 0430   RBC 3.84* 11/07/2009 1649   HGB 9.0* 10/28/2014 0430   HCT 28.3* 10/28/2014 0430   PLT 145* 10/28/2014 0430   MCV 86.0 10/28/2014 0430   MCH 27.4 10/28/2014 0430   MCHC 31.8 10/28/2014 0430   RDW 13.5 10/28/2014 0430   LYMPHSABS 0.6* 09/15/2014 1327   MONOABS 0.4 09/15/2014 1327   EOSABS 0.3 09/15/2014 1327   BASOSABS 0.0 09/15/2014 1327     PATHOLOGY REPORT OF SURGICAL PATHOLOGY FIAL DIAGNOSIS Diagnosis 1. Lung, wedge biopsy/resection, superior segment of left lower lobe - METASTATIC ADENOCARCINOMA, CONSISTENT WITH COLONIC PRIMARY, SPANNING 1.5 CM. - LYMPHOVASCULAR INVASION IS IDENTIFIED. - THE SURGICAL RESECTION MARGINS ARE  NEGATIVE FOR CARCINOMA. - SEE COMMENT. 2. Lymph node, biopsy, 9 L - THERE IS NO EVIDENCE OF CARCINOMA IN 1 OF 1 LYMPH NODE (0/1). 3. Lymph node, biopsy, 9 L #2 - THERE IS NO EVIDENCE OF CARCINOMA IN 1 OF 1 LYMPH NODE (0/1). 4. Lymph node, biopsy, 11 L - THERE IS NO EVIDENCE OF CARCINOMA IN 1 OF 1 LYMPH NODE (0/1). 5. Lymph node, biopsy, 5 - THERE IS NO EVIDENCE OF CARCINOMA IN 1 OF 1 LYMPH NODE (0/1). Microscopic Comment 1. A block will be sent for KRAS testing and the results reported separately. Additional testing can be performed upon clinician request. (JBK:ds 10/27/14) [name], Electronic Signature (Case signed 10/27/2014) Intraoperative    RADIOGRAPHIC STUDIES:  CLINICAL DATA: Subsequent treatment strategy for restaging rectal cancer. Enlarging pulmonary nodule.  EXAM: NUCLEAR MEDICINE PET SKULL BASE TO THIGH COMPARISON: PET of 03/05/2013. Chest CTs of 08/17/14 and 05/26/2014.  IMPRESSION: 1. Hypermetabolism corresponding to the superior segment left lower lobe lung nodule. This could represent a primary bronchogenic carcinoma or isolated metastatic  lesion. Primary bronchogenic carcinomas slightly favored. 2. No evidence of hypermetabolic metastatic disease. 3. Left axillary node is small and demonstrates low-level non malignant range hypermetabolism. Favor reactive etiology. 4. Heterogeneous marrow density and metabolism. The CT appearance has been present back to 2010. Favor Paget's disease. An area of more focal hypermetabolism in the left side of the sacrum is nonspecific and without focal CT correlate. Metastatic disease is felt unlikely but cannot be entirely excluded. Recommend attention on follow-up.   Electronically Signed  By: Abigail Miyamoto M.D.  On: 10/07/2014 13:02  ASSESSMENT AND PLAN:  History of locally advanced rectal cancer Hypermetabolic pulmonary nodule Prior tobacco abuse Pagets disease of bone Anemia CKD CEA was  elevated at time of initial diagnosis in 2014, but not with recent recurrence Limited decision making capacity  She has undergone successful surgical resection of the solitary pulmonary nodule. Unfortunately pathology was c/w recurrent CRC.the size my concerns about treating her with an oxaliplatin-based drug regimen. [name] feels very strongly that between staff, the patient, and her pointed caregivers that she should do well. She unfortunately is now 8 weeks from resection and I advised them we need to proceed forward quickly. Dr. Arnoldo [name] is familiar with the patient and we will get her in promptly for report. We will arrange for chemotherapy teaching with the patient, staff at the care home and her social worker. I am going to try to obtain CT imaging in the next week she has not been reimaged since July.   I again discussed ongoing observation but her risk of relapse is of course significant. Studies regarding resection of a solitary metastases in CRC are more limited to hepatic metastatic disease.  She had an APR for her rectal cancer. I do not see where she has had recent endoscopy and we will need to address this moving forward.  Last available mammogram is from 08/16/2013, this will also need to be addressed at follow-up.   She has a chronic anemia that has not been aggressively evaluated. This may become problematic throughout therapy and at her next follow-up we will proceed with a basic anemia evaluation.  [phone] = phone number given to call out to Ms. Donnell's case worker, who is also her legal guardian.  All questions were answered. The patient knows to call the clinic with any problems, questions or concerns. We can certainly see the patient much sooner if necessary.   This document serves as a record of services personally performed by Ancil Linsey, MD. It was created on her behalf by Janace Hoard, a trained medical scribe. The creation of this record is  based on the scribe's personal observations and the provider's statements to them. This document has been checked and approved by the attending provider.  I have reviewed the above documentation for accuracy and completeness, and I agree with the above.  This note was electronically signed.  Kelby Fam. Whitney Muse, MD

## 2014-12-27 NOTE — Patient Instructions (Signed)
..  [place] at [facility] Discharge Instructions  RECOMMENDATIONS MADE BY THE CONSULTANT AND ANY TEST RESULTS WILL BE SENT TO YOUR REFERRING PHYSICIAN. Appt with Dr. Arnoldo Morale on Thursday at [place] [facility] on Thursday after your appt with Dr. Marquette Old a cath to be placed on Monday with Dr. Katharina Caper next Wednesday at 0830  Thank you for choosing [place] at [facility] to provide your oncology and hematology care.  To afford each patient quality time with our provider, please arrive at least 15 minutes before your scheduled appointment time.    You need to re-schedule your appointment should you arrive 10 or more minutes late.  We strive to give you quality time with our providers, and arriving late affects you and other patients whose appointments are after yours.  Also, if you no show three or more times for appointments you may be dismissed from the clinic at the providers discretion.     Again, thank you for choosing [facility].  Our hope is that these requests will decrease the amount of time that you wait before being seen by our physicians.       _____________________________________________________________  Should you have questions after your visit to [facility], please contact our office at [phone] between the hours of 8:30 a.m. and 4:30 p.m.  Voicemails left after 4:30 p.m. will not be returned until the following business day.  For prescription refill requests, have your pharmacy contact our office.

## 2014-12-28 MED ORDER — PROCHLORPERAZINE MALEATE 10 MG PO TABS: 10.0000 mg | ORAL_TABLET | Freq: Four times a day (QID) | ORAL | Status: DC | PRN

## 2014-12-28 MED ORDER — LIDOCAINE-PRILOCAINE 2.5-2.5 % EX CREA: TOPICAL_CREAM | CUTANEOUS | Status: DC

## 2014-12-28 MED ORDER — ONDANSETRON HCL 8 MG PO TABS: 8.0000 mg | ORAL_TABLET | Freq: Three times a day (TID) | ORAL | Status: DC | PRN

## 2014-12-28 NOTE — Patient Instructions (Addendum)
[name]   CHEMOTHERAPY INSTRUCTIONS  Premeds: Zofran - for nausea/vomiting prevention/reduction. Dexamethasone - steroid - given to reduce the risk of you having an allergic type reaction to the chemotherapy and it decreases nausea/vomiting. Dex can cause you to feel energized, nervous/anxious/jittery, make you have trouble sleeping, and/or make you feel hot/flushed in the face/neck and/or look pink/red in the face/neck. These side effects will pass as the Dex wears off. (takes 20 minutes to infuse)   Oxaliplatin - anaphylactic reaction, neurotoxicity (i.e., headache, fatigue, difficulty sleeping, pain). Peripheral neuropathy (numbness/tingling/burning in hands/fingers/feet/toes) - will be aggravated by cold/cool temperatures. We need to know when you develop peripheral neuropathy so that we can monitor it and treat if necessary. Nausea/vomiting, diarrhea, bone marrow suppression (lowers white blood cells (fight infection), lowers red blood cells (make up your blood), lowers platelets (help blood to clot). Pulmonary fibrosis. Once you have received Oxaliplatin do NOT eat or drinking anything cold/cool for 5-10 days! Do NOT breathe in cold/cool air and do NOT touch anything cold for 5-10 days. The time frame varies from patient to patient on the length of time you must abstain from the above mentioned. Best advice is to wait at least 5 days before attempting to reintroduce cold/cool back into life. Slowly reintroduce cool/cold things! Wear gloves when getting items out of the refrigerator (of course, these would be things you are going to heat to eat)! (takes 2 hours to infuse)  Leucovorin - this is a medication that is not chemo but given with chemo. This med "rescues" the healthy cells before we administer the drug 5FU. This makes the 5FU work better. (takes 2 hours to infuse- this goes in @ the same time as the Oxaliplatin chemo)  5FU: bone marrow suppression  (low white blood cells - wbcs fight infection, low red blood cells - rbcs make up your blood, low platelets - this is what makes your blood clot, nausea/vomiting, diarrhea, mouth sores, hair loss, dry skin, ocular toxicities (increased tear production, sensitivity to light). You must wear sunscreen/sunglasses. Cover your skin when out in sunlight. You will get burned very easily. The nurse will sit @ your bedside and push this medication in via a syringe. This will take 10 minutes. Then she will connect you to an ambulatory pump with this medication attached. You will wear the pump for 46 hours. The 5FU chemo will infuse for 46 hours total. Then the pump will be removed.   Neulasta - given 24-27 hours after the completion of chemo. We will apply the On Body Injector on Friday after the pump is removed. This injection works to boost the body's supply of white blood cells. The major side effect of this drug is bone or muscle pain. This can occur a few hours after the injection to 7-10 days after the injection. Ibuprofen or Aleve is the drug of choice for this bone/muscle pain. If this does not help please contact the [facility].   POTENTIAL SIDE EFFECTS OF TREATMENT: Increased Susceptibility to Infection, Vomiting, Constipation, Hair Thinning, Changes in Character of Skin and Nails (brittleness, dryness,etc.), Bone Marrow Suppression, Abdominal Cramping, Nausea, Diarrhea, Sun Sensitivity and Mouth Sores    EDUCATIONAL MATERIALS GIVEN AND REVIEWED: Chemotherapy and You Book Specific Instructions Sheets: Oxaliplatin, Leucovorin, 5FU, Zofran, Dexamethasone, Compazine, EMLA cream   SELF CARE ACTIVITIES WHILE ON CHEMOTHERAPY: Increase your fluid intake 48 hours prior to treatment and drink at least 2 quarts per day after treatment., No alcohol intake.,  No aspirin or other medications unless approved by your oncologist., Eat foods that are light and easy to digest., No fried, fatty, or spicy foods  immediately before or after treatment., Have teeth cleaned professionally before starting treatment. Keep dentures and partial plates clean., Use soft toothbrush and do not use mouthwashes that contain alcohol. Biotene is a good mouthwash that is available at most pharmacies or may be ordered by calling [phone]., Use warm salt water gargles (1 teaspoon salt per 1 quart warm water) before and after meals and at bedtime. Or you may rinse with 2 tablespoons of three -percent hydrogen peroxide mixed in eight ounces of water., Always use sunscreen with SPF (Sun Protection Factor) of 30 or higher., Use your nausea medication as directed to prevent nausea., Use your stool softener or laxative as directed to prevent constipation. and Use your anti-diarrheal medication as directed to stop diarrhea.  Please wash your hands for at least 30 seconds using warm soapy water. Handwashing is the #1 way to prevent the spread of germs. Stay away from sick people or people who are getting over a cold. If you develop respiratory systems such as green/yellow mucus production or productive cough or persistent cough let us know and we will see if you need an antibiotic. It is a good idea to keep a pair of gloves on when going into grocery stores/[employer] to decrease your risk of coming into contact with germs on the carts, etc. Carry alcohol hand gel with you at all times and use it frequently if out in public. All foods need to be cooked thoroughly. No raw foods. No medium or undercooked meats, eggs. If your food is cooked medium well, it does not need to be hot pink or saturated with bloody liquid at all. Vegetables and fruits need to be washed/rinsed under the faucet with a dish detergent before being consumed. You can eat raw fruits and vegetables unless we tell you otherwise but it would be best if you cooked them or bought frozen. Do not eat off of salad bars or hot bars unless you really trust the cleanliness of the  restaurant. If you need dental work, please let Dr. Whitney Muse know before you go for your appointment so that we can coordinate the best possible time for you in regards to your chemo regimen. You need to also let your dentist know that you are actively taking chemo. We may need to do labs prior to your dental appointment. We also want your bowels moving at least every other day. If this is not happening, we need to know so that we can get you on a bowel regimen to help you go.    MEDICATIONS: You have been given prescriptions for the following medications:  Zofran '8mg'$  tablet. Take 1 tablet every 8 hours as needed for nausea/vomiting. (#1 nausea med to take, this can constipate)  Compazine '10mg'$  tablet. Take 1 tablet every 6 hours as needed for nausea/vomiting. (#2 nausea med to take, this can make you sleepy)  EMLA cream. Apply a quarter size amount to port site 1 hour prior to chemo. Do not rub in. Cover with plastic wrap.   Over-the-Counter Meds:  Miralax 1 capful in 8 oz of fluid daily. May increase to two times a day if needed. This is a stool softener. If this doesn't work proceed you can add:  Senokot S - start with 1 tablet two times a day and increase to 4 tablets two times a day if  needed. (total of 8 tablets in a 24 hour period). This is a stimulant laxative.   Call us if this does not help your bowels move.   Imodium '2mg'$  capsule. Take 2 capsules after the 1st loose stool and then 1 capsule every 2 hours until you go a total of 12 hours without having a loose stool. Call the [facility] if loose stools continue.   SYMPTOMS TO REPORT AS SOON AS POSSIBLE AFTER TREATMENT:  FEVER GREATER THAN 100.5 F  CHILLS WITH OR WITHOUT FEVER  NAUSEA AND VOMITING THAT IS NOT CONTROLLED WITH YOUR NAUSEA MEDICATION  UNUSUAL SHORTNESS OF BREATH  UNUSUAL BRUISING OR BLEEDING  TENDERNESS IN MOUTH AND THROAT WITH OR WITHOUT PRESENCE OF ULCERS  URINARY PROBLEMS  BOWEL PROBLEMS  UNUSUAL  RASH   Wear comfortable clothing and clothing appropriate for easy access to any Portacath or PICC line. Let us know if there is anything that we can do to make your therapy better!      I have been informed and understand all of the instructions given to me and have received a copy. I have been instructed to call the clinic [phone] or my family physician as soon as possible for continued medical care, if indicated. I do not have any more questions at this time but understand that I may call the [facility] or the Patient Navigator at [phone] during office hours should I have questions or need assistance in obtaining follow-up care.             Oxaliplatin Injection What is this medicine? OXALIPLATIN (ox AL i PLA tin) is a chemotherapy drug. It targets fast dividing cells, like cancer cells, and causes these cells to die. This medicine is used to treat cancers of the colon and rectum, and many other cancers. This medicine may be used for other purposes; ask your health care provider or pharmacist if you have questions. What should I tell my health care provider before I take this medicine? They need to know if you have any of these conditions: -kidney disease -an unusual or allergic reaction to oxaliplatin, other chemotherapy, other medicines, foods, dyes, or preservatives -pregnant or trying to get pregnant -breast-feeding How should I use this medicine? This drug is given as an infusion into a vein. It is administered in a hospital or clinic by a specially trained health care professional. Talk to your pediatrician regarding the use of this medicine in children. Special care may be needed. Overdosage: If you think you have taken too much of this medicine contact a poison control center or emergency room at once. NOTE: This medicine is only for you. Do not share this medicine with others. What if I miss a dose? It is important not to miss a dose. Call your  doctor or health care professional if you are unable to keep an appointment. What may interact with this medicine? -medicines to increase blood counts like filgrastim, pegfilgrastim, sargramostim -probenecid -some antibiotics like amikacin, gentamicin, neomycin, polymyxin B, streptomycin, tobramycin -zalcitabine Talk to your doctor or health care professional before taking any of these medicines: -acetaminophen -aspirin -ibuprofen -ketoprofen -naproxen This list may not describe all possible interactions. Give your health care provider a list of all the medicines, herbs, non-prescription drugs, or dietary supplements you use. Also tell them if you smoke, drink alcohol, or use illegal drugs. Some items may interact with your medicine. What should I watch for while using this medicine? Your condition will be monitored  carefully while you are receiving this medicine. You will need important blood work done while you are taking this medicine. This medicine can make you more sensitive to cold. Do not drink cold drinks or use ice. Cover exposed skin before coming in contact with cold temperatures or cold objects. When out in cold weather wear warm clothing and cover your mouth and nose to warm the air that goes into your lungs. Tell your doctor if you get sensitive to the cold. This drug may make you feel generally unwell. This is not uncommon, as chemotherapy can affect healthy cells as well as cancer cells. Report any side effects. Continue your course of treatment even though you feel ill unless your doctor tells you to stop. In some cases, you may be given additional medicines to help with side effects. Follow all directions for their use. Call your doctor or health care professional for advice if you get a fever, chills or sore throat, or other symptoms of a cold or flu. Do not treat yourself. This drug decreases your body's ability to fight infections. Try to avoid being around people who are  sick. This medicine may increase your risk to bruise or bleed. Call your doctor or health care professional if you notice any unusual bleeding. Be careful brushing and flossing your teeth or using a toothpick because you may get an infection or bleed more easily. If you have any dental work done, tell your dentist you are receiving this medicine. Avoid taking products that contain aspirin, acetaminophen, ibuprofen, naproxen, or ketoprofen unless instructed by your doctor. These medicines may hide a fever. Do not become pregnant while taking this medicine. Women should inform their doctor if they wish to become pregnant or think they might be pregnant. There is a potential for serious side effects to an unborn child. Talk to your health care professional or pharmacist for more information. Do not breast-feed an infant while taking this medicine. Call your doctor or health care professional if you get diarrhea. Do not treat yourself. What side effects may I notice from receiving this medicine? Side effects that you should report to your doctor or health care professional as soon as possible: -allergic reactions like skin rash, itching or hives, swelling of the face, lips, or tongue -low blood counts - This drug may decrease the number of white blood cells, red blood cells and platelets. You may be at increased risk for infections and bleeding. -signs of infection - fever or chills, cough, sore throat, pain or difficulty passing urine -signs of decreased platelets or bleeding - bruising, pinpoint red spots on the skin, black, tarry stools, nosebleeds -signs of decreased red blood cells - unusually weak or tired, fainting spells, lightheadedness -breathing problems -chest pain, pressure -cough -diarrhea -jaw tightness -mouth sores -nausea and vomiting -pain, swelling, redness or irritation at the injection site -pain, tingling, numbness in the hands or feet -problems with balance, talking,  walking -redness, blistering, peeling or loosening of the skin, including inside the mouth -trouble passing urine or change in the amount of urine Side effects that usually do not require medical attention (report to your doctor or health care professional if they continue or are bothersome): -changes in vision -constipation -hair loss -loss of appetite -metallic taste in the mouth or changes in taste -stomach pain This list may not describe all possible side effects. Call your doctor for medical advice about side effects. You may report side effects to FDA at 1-800-FDA-1088. Where should I keep  my medicine? This drug is given in a hospital or clinic and will not be stored at home. NOTE: This sheet is a summary. It may not cover all possible information. If you have questions about this medicine, talk to your doctor, pharmacist, or health care provider.    2016, Elsevier/[employer] Standard. (2007-09-29 17:22:47) Leucovorin injection What is this medicine? LEUCOVORIN (loo koe VOR in) is used to prevent or treat the harmful effects of some medicines. This medicine is used to treat anemia caused by a low amount of folic acid in the body. It is also used with 5-fluorouracil (5-FU) to treat colon cancer. This medicine may be used for other purposes; ask your health care provider or pharmacist if you have questions. What should I tell my health care provider before I take this medicine? They need to know if you have any of these conditions: -anemia from low levels of vitamin B-12 in the blood -an unusual or allergic reaction to leucovorin, folic acid, other medicines, foods, dyes, or preservatives -pregnant or trying to get pregnant -breast-feeding How should I use this medicine? This medicine is for injection into a muscle or into a vein. It is given by a health care professional in a hospital or clinic setting. Talk to your pediatrician regarding the use of this medicine in children. Special care  may be needed. Overdosage: If you think you have taken too much of this medicine contact a poison control center or emergency room at once. NOTE: This medicine is only for you. Do not share this medicine with others. What if I miss a dose? This does not apply. What may interact with this medicine? -capecitabine -fluorouracil -phenobarbital -phenytoin -primidone -trimethoprim-sulfamethoxazole This list may not describe all possible interactions. Give your health care provider a list of all the medicines, herbs, non-prescription drugs, or dietary supplements you use. Also tell them if you smoke, drink alcohol, or use illegal drugs. Some items may interact with your medicine. What should I watch for while using this medicine? Your condition will be monitored carefully while you are receiving this medicine. This medicine may increase the side effects of 5-fluorouracil, 5-FU. Tell your doctor or health care professional if you have diarrhea or mouth sores that do not get better or that get worse. What side effects may I notice from receiving this medicine? Side effects that you should report to your doctor or health care professional as soon as possible: -allergic reactions like skin rash, itching or hives, swelling of the face, lips, or tongue -breathing problems -fever, infection -mouth sores -unusual bleeding or bruising -unusually weak or tired Side effects that usually do not require medical attention (report to your doctor or health care professional if they continue or are bothersome): -constipation or diarrhea -loss of appetite -nausea, vomiting This list may not describe all possible side effects. Call your doctor for medical advice about side effects. You may report side effects to FDA at 1-800-FDA-1088. Where should I keep my medicine? This drug is given in a hospital or clinic and will not be stored at home. NOTE: This sheet is a summary. It may not cover all possible  information. If you have questions about this medicine, talk to your doctor, pharmacist, or health care provider.    2016, Elsevier/[employer] Standard. (2007-09-08 16:50:29) Fluorouracil, 5-FU injection What is this medicine? FLUOROURACIL, 5-FU (flure oh YOOR a sil) is a chemotherapy drug. It slows the growth of cancer cells. This medicine is used to treat many types of cancer like  breast cancer, colon or rectal cancer, pancreatic cancer, and stomach cancer. This medicine may be used for other purposes; ask your health care provider or pharmacist if you have questions. What should I tell my health care provider before I take this medicine? They need to know if you have any of these conditions: -blood disorders -dihydropyrimidine dehydrogenase (DPD) deficiency -infection (especially a virus infection such as chickenpox, cold sores, or herpes) -kidney disease -liver disease -malnourished, poor nutrition -recent or ongoing radiation therapy -an unusual or allergic reaction to fluorouracil, other chemotherapy, other medicines, foods, dyes, or preservatives -pregnant or trying to get pregnant -breast-feeding How should I use this medicine? This drug is given as an infusion or injection into a vein. It is administered in a hospital or clinic by a specially trained health care professional. Talk to your pediatrician regarding the use of this medicine in children. Special care may be needed. Overdosage: If you think you have taken too much of this medicine contact a poison control center or emergency room at once. NOTE: This medicine is only for you. Do not share this medicine with others. What if I miss a dose? It is important not to miss your dose. Call your doctor or health care professional if you are unable to keep an appointment. What may interact with this medicine? -allopurinol -cimetidine -dapsone -digoxin -hydroxyurea -leucovorin -levamisole -medicines for seizures like ethotoin,  fosphenytoin, phenytoin -medicines to increase blood counts like filgrastim, pegfilgrastim, sargramostim -medicines that treat or prevent blood clots like warfarin, enoxaparin, and dalteparin -methotrexate -metronidazole -pyrimethamine -some other chemotherapy drugs like busulfan, cisplatin, estramustine, vinblastine -trimethoprim -trimetrexate -vaccines Talk to your doctor or health care professional before taking any of these medicines: -acetaminophen -aspirin -ibuprofen -ketoprofen -naproxen This list may not describe all possible interactions. Give your health care provider a list of all the medicines, herbs, non-prescription drugs, or dietary supplements you use. Also tell them if you smoke, drink alcohol, or use illegal drugs. Some items may interact with your medicine. What should I watch for while using this medicine? Visit your doctor for checks on your progress. This drug may make you feel generally unwell. This is not uncommon, as chemotherapy can affect healthy cells as well as cancer cells. Report any side effects. Continue your course of treatment even though you feel ill unless your doctor tells you to stop. In some cases, you may be given additional medicines to help with side effects. Follow all directions for their use. Call your doctor or health care professional for advice if you get a fever, chills or sore throat, or other symptoms of a cold or flu. Do not treat yourself. This drug decreases your body's ability to fight infections. Try to avoid being around people who are sick. This medicine may increase your risk to bruise or bleed. Call your doctor or health care professional if you notice any unusual bleeding. Be careful brushing and flossing your teeth or using a toothpick because you may get an infection or bleed more easily. If you have any dental work done, tell your dentist you are receiving this medicine. Avoid taking products that contain aspirin, acetaminophen,  ibuprofen, naproxen, or ketoprofen unless instructed by your doctor. These medicines may hide a fever. Do not become pregnant while taking this medicine. Women should inform their doctor if they wish to become pregnant or think they might be pregnant. There is a potential for serious side effects to an unborn child. Talk to your health care professional or pharmacist for  more information. Do not breast-feed an infant while taking this medicine. Men should inform their doctor if they wish to father a child. This medicine may lower sperm counts. Do not treat diarrhea with over the counter products. Contact your doctor if you have diarrhea that lasts more than 2 days or if it is severe and watery. This medicine can make you more sensitive to the sun. Keep out of the sun. If you cannot avoid being in the sun, wear protective clothing and use sunscreen. Do not use sun lamps or tanning beds/booths. What side effects may I notice from receiving this medicine? Side effects that you should report to your doctor or health care professional as soon as possible: -allergic reactions like skin rash, itching or hives, swelling of the face, lips, or tongue -low blood counts - this medicine may decrease the number of white blood cells, red blood cells and platelets. You may be at increased risk for infections and bleeding. -signs of infection - fever or chills, cough, sore throat, pain or difficulty passing urine -signs of decreased platelets or bleeding - bruising, pinpoint red spots on the skin, black, tarry stools, blood in the urine -signs of decreased red blood cells - unusually weak or tired, fainting spells, lightheadedness -breathing problems -changes in vision -chest pain -mouth sores -nausea and vomiting -pain, swelling, redness at site where injected -pain, tingling, numbness in the hands or feet -redness, swelling, or sores on hands or feet -stomach pain -unusual bleeding Side effects that usually  do not require medical attention (report to your doctor or health care professional if they continue or are bothersome): -changes in finger or toe nails -diarrhea -dry or itchy skin -hair loss -headache -loss of appetite -sensitivity of eyes to the light -stomach upset -unusually teary eyes This list may not describe all possible side effects. Call your doctor for medical advice about side effects. You may report side effects to FDA at 1-800-FDA-1088. Where should I keep my medicine? This drug is given in a hospital or clinic and will not be stored at home. NOTE: This sheet is a summary. It may not cover all possible information. If you have questions about this medicine, talk to your doctor, pharmacist, or health care provider.    2016, [employer]. (2007-07-08 13:53:16) Ondansetron injection What is this medicine? ONDANSETRON (on DAN se tron) is used to treat nausea and vomiting caused by chemotherapy. It is also used to prevent or treat nausea and vomiting after surgery. This medicine may be used for other purposes; ask your health care provider or pharmacist if you have questions. What should I tell my health care provider before I take this medicine? They need to know if you have any of these conditions: -heart disease -history of irregular heartbeat -liver disease -low levels of magnesium or potassium in the blood -an unusual or allergic reaction to ondansetron, granisetron, other medicines, foods, dyes, or preservatives -pregnant or trying to get pregnant -breast-feeding How should I use this medicine? This medicine is for infusion into a vein. It is given by a health care professional in a hospital or clinic setting. Talk to your pediatrician regarding the use of this medicine in children. Special care may be needed. Overdosage: If you think you have taken too much of this medicine contact a poison control center or emergency room at once. NOTE: This medicine is  only for you. Do not share this medicine with others. What if I miss a dose? This does not apply.  What may interact with this medicine? Do not take this medicine with any of the following medications: -apomorphine -certain medicines for fungal infections like fluconazole, itraconazole, ketoconazole, posaconazole, voriconazole -cisapride -dofetilide -dronedarone -pimozide -thioridazine -ziprasidone This medicine may also interact with the following medications: -carbamazepine -certain medicines for depression, anxiety, or psychotic disturbances -fentanyl -linezolid -MAOIs like Carbex, Eldepryl, Marplan, Nardil, and Parnate -methylene blue (injected into a vein) -other medicines that prolong the QT interval (cause an abnormal heart rhythm) -phenytoin -rifampicin -tramadol This list may not describe all possible interactions. Give your health care provider a list of all the medicines, herbs, non-prescription drugs, or dietary supplements you use. Also tell them if you smoke, drink alcohol, or use illegal drugs. Some items may interact with your medicine. What should I watch for while using this medicine? Your condition will be monitored carefully while you are receiving this medicine. What side effects may I notice from receiving this medicine? Side effects that you should report to your doctor or health care professional as soon as possible: -allergic reactions like skin rash, itching or hives, swelling of the face, lips, or tongue -breathing problems -confusion -dizziness -fast or irregular heartbeat -feeling faint or lightheaded, falls -fever and chills -loss of balance or coordination -seizures -sweating -swelling of the hands and feet -tightness in the chest -tremors -unusually weak or tired Side effects that usually do not require medical attention (report to your doctor or health care professional if they continue or are bothersome): -constipation or  diarrhea -headache This list may not describe all possible side effects. Call your doctor for medical advice about side effects. You may report side effects to FDA at 1-800-FDA-1088. Where should I keep my medicine? This drug is given in a hospital or clinic and will not be stored at home. NOTE: This sheet is a summary. It may not cover all possible information. If you have questions about this medicine, talk to your doctor, pharmacist, or health care provider.    2016, [employer]. (2012-12-09 16:18:28) Dexamethasone injection What is this medicine? DEXAMETHASONE (dex a METH a sone) is a corticosteroid. It is used to treat inflammation of the skin, joints, lungs, and other organs. Common conditions treated include asthma, allergies, and arthritis. It is also used for other conditions, like blood disorders and diseases of the adrenal glands. This medicine may be used for other purposes; ask your health care provider or pharmacist if you have questions. What should I tell my health care provider before I take this medicine? They need to know if you have any of these conditions: -blood clotting problems -Cushing's syndrome -diabetes -glaucoma -heart problems or disease -high blood pressure -infection like herpes, measles, tuberculosis, or chickenpox -kidney disease -liver disease -mental problems -myasthenia gravis -osteoporosis -previous heart attack -seizures -stomach, ulcer or intestine disease including colitis and diverticulitis -thyroid problem -an unusual or allergic reaction to dexamethasone, corticosteroids, other medicines, lactose, foods, dyes, or preservatives -pregnant or trying to get pregnant -breast-feeding How should I use this medicine? This medicine is for injection into a muscle, joint, lesion, soft tissue, or vein. It is given by a health care professional in a hospital or clinic setting. Talk to your pediatrician regarding the use of this medicine in  children. Special care may be needed. Overdosage: If you think you have taken too much of this medicine contact a poison control center or emergency room at once. NOTE: This medicine is only for you. Do not share this medicine with others. What if I miss  a dose? This may not apply. If you are having a series of injections over a prolonged period, try not to miss an appointment. Call your doctor or health care professional to reschedule if you are unable to keep an appointment. What may interact with this medicine? Do not take this medicine with any of the following medications: -mifepristone, RU-486 -vaccines This medicine may also interact with the following medications: -amphotericin B -antibiotics like clarithromycin, erythromycin, and troleandomycin -aspirin and aspirin-like drugs -barbiturates like phenobarbital -carbamazepine -cholestyramine -cholinesterase inhibitors like donepezil, galantamine, rivastigmine, and tacrine -cyclosporine -digoxin -diuretics -ephedrine -female hormones, like estrogens or progestins and birth control pills -indinavir -isoniazid -ketoconazole -medicines for diabetes -medicines that improve muscle tone or strength for conditions like myasthenia gravis -NSAIDs, medicines for pain and inflammation, like ibuprofen or naproxen -phenytoin -rifampin -thalidomide -warfarin This list may not describe all possible interactions. Give your health care provider a list of all the medicines, herbs, non-prescription drugs, or dietary supplements you use. Also tell them if you smoke, drink alcohol, or use illegal drugs. Some items may interact with your medicine. What should I watch for while using this medicine? Your condition will be monitored carefully while you are receiving this medicine. If you are taking this medicine for a long time, carry an identification card with your name and address, the type and dose of your medicine, and your doctor's name and  address. This medicine may increase your risk of getting an infection. Stay away from people who are sick. Tell your doctor or health care professional if you are around anyone with measles or chickenpox. Talk to your health care provider before you get any vaccines that you take this medicine. If you are going to have surgery, tell your doctor or health care professional that you have taken this medicine within the last twelve months. Ask your doctor or health care professional about your diet. You may need to lower the amount of salt you eat. The medicine can increase your blood sugar. If you are a diabetic check with your doctor if you need help adjusting the dose of your diabetic medicine. What side effects may I notice from receiving this medicine? Side effects that you should report to your doctor or health care professional as soon as possible: -allergic reactions like skin rash, itching or hives, swelling of the face, lips, or tongue -black or tarry stools -change in the amount of urine -changes in vision -confusion, excitement, restlessness, a false sense of well-being -fever, sore throat, sneezing, cough, or other signs of infection, wounds that will not heal -hallucinations -increased thirst -mental depression, mood swings, mistaken feelings of self importance or of being mistreated -pain in hips, back, ribs, arms, shoulders, or legs -pain, redness, or irritation at the injection site -redness, blistering, peeling or loosening of the skin, including inside the mouth -rounding out of face -swelling of feet or lower legs -unusual bleeding or bruising -unusual tired or weak -wounds that do not heal Side effects that usually do not require medical attention (report to your doctor or health care professional if they continue or are bothersome): -diarrhea or constipation -change in taste -headache -nausea, vomiting -skin problems, acne, thin and shiny skin -touble  sleeping -unusual growth of hair on the face or body -weight gain This list may not describe all possible side effects. Call your doctor for medical advice about side effects. You may report side effects to FDA at 1-800-FDA-1088. Where should I keep my medicine? This drug is given in  a hospital or clinic and will not be stored at home. NOTE: This sheet is a summary. It may not cover all possible information. If you have questions about this medicine, talk to your doctor, pharmacist, or health care provider.    2016, [employer] Standard. (2007-06-25 14:04:12) Ondansetron tablets What is this medicine? ONDANSETRON (on DAN se tron) is used to treat nausea and vomiting caused by chemotherapy. It is also used to prevent or treat nausea and vomiting after surgery. This medicine may be used for other purposes; ask your health care provider or pharmacist if you have questions. What should I tell my health care provider before I take this medicine? They need to know if you have any of these conditions: -heart disease -history of irregular heartbeat -liver disease -low levels of magnesium or potassium in the blood -an unusual or allergic reaction to ondansetron, granisetron, other medicines, foods, dyes, or preservatives -pregnant or trying to get pregnant -breast-feeding How should I use this medicine? Take this medicine by mouth with a glass of water. Follow the directions on your prescription label. Take your doses at regular intervals. Do not take your medicine more often than directed. Talk to your pediatrician regarding the use of this medicine in children. Special care may be needed. Overdosage: If you think you have taken too much of this medicine contact a poison control center or emergency room at once. NOTE: This medicine is only for you. Do not share this medicine with others. What if I miss a dose? If you miss a dose, take it as soon as you can. If it is almost time for your next  dose, take only that dose. Do not take double or extra doses. What may interact with this medicine? Do not take this medicine with any of the following medications: -apomorphine -certain medicines for fungal infections like fluconazole, itraconazole, ketoconazole, posaconazole, voriconazole -cisapride -dofetilide -dronedarone -pimozide -thioridazine -ziprasidone This medicine may also interact with the following medications: -carbamazepine -certain medicines for depression, anxiety, or psychotic disturbances -fentanyl -linezolid -MAOIs like Carbex, Eldepryl, Marplan, Nardil, and Parnate -methylene blue (injected into a vein) -other medicines that prolong the QT interval (cause an abnormal heart rhythm) -phenytoin -rifampicin -tramadol This list may not describe all possible interactions. Give your health care provider a list of all the medicines, herbs, non-prescription drugs, or dietary supplements you use. Also tell them if you smoke, drink alcohol, or use illegal drugs. Some items may interact with your medicine. What should I watch for while using this medicine? Check with your doctor or health care professional right away if you have any sign of an allergic reaction. What side effects may I notice from receiving this medicine? Side effects that you should report to your doctor or health care professional as soon as possible: -allergic reactions like skin rash, itching or hives, swelling of the face, lips or tongue -breathing problems -confusion -dizziness -fast or irregular heartbeat -feeling faint or lightheaded, falls -fever and chills -loss of balance or coordination -seizures -sweating -swelling of the hands or feet -tightness in the chest -tremors -unusually weak or tired Side effects that usually do not require medical attention (report to your doctor or health care professional if they continue or are bothersome): -constipation or diarrhea -headache This list  may not describe all possible side effects. Call your doctor for medical advice about side effects. You may report side effects to FDA at 1-800-FDA-1088. Where should I keep my medicine? Keep out of the reach of children. Store  between 2 and 30 degrees C (36 and 86 degrees F). Throw away any unused medicine after the expiration date. NOTE: This sheet is a summary. It may not cover all possible information. If you have questions about this medicine, talk to your doctor, pharmacist, or health care provider.    2016, [employer]. (2012-12-09 16:27:45) Prochlorperazine tablets What is this medicine? PROCHLORPERAZINE (proe klor PER a zeen) helps to control severe nausea and vomiting. This medicine is also used to treat schizophrenia. It can also help patients who experience anxiety that is not due to psychological illness. This medicine may be used for other purposes; ask your health care provider or pharmacist if you have questions. What should I tell my health care provider before I take this medicine? They need to know if you have any of these conditions: -blood disorders or disease -dementia -liver disease or jaundice -Parkinson's disease -uncontrollable movement disorder -an unusual or allergic reaction to prochlorperazine, other medicines, foods, dyes, or preservatives -pregnant or trying to get pregnant -breast-feeding How should I use this medicine? Take this medicine by mouth with a glass of water. Follow the directions on the prescription label. Take your doses at regular intervals. Do not take your medicine more often than directed. Do not stop taking this medicine suddenly. This can cause nausea, vomiting, and dizziness. Ask your doctor or health care professional for advice. Talk to your pediatrician regarding the use of this medicine in children. Special care may be needed. While this drug may be prescribed for children as young as 2 years for selected conditions,  precautions do apply. Overdosage: If you think you have taken too much of this medicine contact a poison control center or emergency room at once. NOTE: This medicine is only for you. Do not share this medicine with others. What if I miss a dose? If you miss a dose, take it as soon as you can. If it is almost time for your next dose, take only that dose. Do not take double or extra doses. What may interact with this medicine? Do not take this medicine with any of the following medications: -amoxapine -antidepressants like citalopram, escitalopram, fluoxetine, paroxetine, and sertraline -deferoxamine -dofetilide -maprotiline -tricyclic antidepressants like amitriptyline, clomipramine, imipramine, nortiptyline and others This medicine may also interact with the following medications: -lithium -medicines for pain -phenytoin -propranolol -warfarin This list may not describe all possible interactions. Give your health care provider a list of all the medicines, herbs, non-prescription drugs, or dietary supplements you use. Also tell them if you smoke, drink alcohol, or use illegal drugs. Some items may interact with your medicine. What should I watch for while using this medicine? Visit your doctor or health care professional for regular checks on your progress. You may get drowsy or dizzy. Do not drive, use machinery, or do anything that needs mental alertness until you know how this medicine affects you. Do not stand or sit up quickly, especially if you are an older patient. This reduces the risk of dizzy or fainting spells. Alcohol may interfere with the effect of this medicine. Avoid alcoholic drinks. This medicine can reduce the response of your body to heat or cold. Dress warm in cold weather and stay hydrated in hot weather. If possible, avoid extreme temperatures like saunas, hot tubs, very hot or cold showers, or activities that can cause dehydration such as vigorous exercise. This  medicine can make you more sensitive to the sun. Keep out of the sun. If you cannot avoid being  in the sun, wear protective clothing and use sunscreen. Do not use sun lamps or tanning beds/booths. Your mouth may get dry. Chewing sugarless gum or sucking hard candy, and drinking plenty of water may help. Contact your doctor if the problem does not go away or is severe. What side effects may I notice from receiving this medicine? Side effects that you should report to your doctor or health care professional as soon as possible: -blurred vision -breast enlargement in men or women -breast milk in women who are not breast-feeding -chest pain, fast or irregular heartbeat -confusion, restlessness -dark yellow or brown urine -difficulty breathing or swallowing -dizziness or fainting spells -drooling, shaking, movement difficulty (shuffling walk) or rigidity -fever, chills, sore throat -involuntary or uncontrollable movements of the eyes, mouth, head, arms, and legs -seizures -stomach area pain -unusually weak or tired -unusual bleeding or bruising -yellowing of skin or eyes Side effects that usually do not require medical attention (report to your doctor or health care professional if they continue or are bothersome): -difficulty passing urine -difficulty sleeping -headache -sexual dysfunction -skin rash, or itching This list may not describe all possible side effects. Call your doctor for medical advice about side effects. You may report side effects to FDA at 1-800-FDA-1088. Where should I keep my medicine? Keep out of the reach of children. Store at room temperature between 15 and 30 degrees C (59 and 86 degrees F). Protect from light. Throw away any unused medicine after the expiration date. NOTE: This sheet is a summary. It may not cover all possible information. If you have questions about this medicine, talk to your doctor, pharmacist, or health care provider.    2016, [employer]  Standard. (2011-07-23 16:59:39) Lidocaine; Prilocaine cream What is this medicine? LIDOCAINE; PRILOCAINE (LYE doe kane; PRIL oh kane) is a topical anesthetic that causes loss of feeling in the skin and surrounding tissues. It is used to numb the skin before procedures or injections. This medicine may be used for other purposes; ask your health care provider or pharmacist if you have questions. What should I tell my health care provider before I take this medicine? They need to know if you have any of these conditions: -glucose-6-phosphate deficiencies -heart disease -kidney or liver disease -methemoglobinemia -an unusual or allergic reaction to lidocaine, prilocaine, other medicines, foods, dyes, or preservatives -pregnant or trying to get pregnant -breast-feeding How should I use this medicine? This medicine is for external use only on the skin. Do not take by mouth. Follow the directions on the prescription label. Wash hands before and after use. Do not use more or leave in contact with the skin longer than directed. Do not apply to eyes or open wounds. It can cause irritation and blurred or temporary loss of vision. If this medicine comes in contact with your eyes, immediately rinse the eye with water. Do not touch or rub the eye. Contact your health care provider right away. Talk to your pediatrician regarding the use of this medicine in children. While this medicine may be prescribed for children for selected conditions, precautions do apply. Overdosage: If you think you have taken too much of this medicine contact a poison control center or emergency room at once. NOTE: This medicine is only for you. Do not share this medicine with others. What if I miss a dose? This medicine is usually only applied once prior to each procedure. It must be in contact with the skin for a period of time for it to work.  If you applied this medicine later than directed, tell your health care professional before  starting the procedure. What may interact with this medicine? -acetaminophen -chloroquine -dapsone -medicines to control heart rhythm -nitrates like nitroglycerin and nitroprusside -other ointments, creams, or sprays that may contain anesthetic medicine -phenobarbital -phenytoin -quinine -sulfonamides like sulfacetamide, sulfamethoxazole, sulfasalazine and others This list may not describe all possible interactions. Give your health care provider a list of all the medicines, herbs, non-prescription drugs, or dietary supplements you use. Also tell them if you smoke, drink alcohol, or use illegal drugs. Some items may interact with your medicine. What should I watch for while using this medicine? Be careful to avoid injury to the treated area while it is numb and you are not aware of pain. Avoid scratching, rubbing, or exposing the treated area to hot or cold temperatures until complete sensation has returned. The numb feeling will wear off a few hours after applying the cream. What side effects may I notice from receiving this medicine? Side effects that you should report to your doctor or health care professional as soon as possible: -blurred vision -chest pain -difficulty breathing -dizziness -drowsiness -fast or irregular heartbeat -skin rash or itching -swelling of your throat, lips, or face -trembling Side effects that usually do not require medical attention (report to your doctor or health care professional if they continue or are bothersome): -changes in ability to feel hot or cold -redness and swelling at the application site This list may not describe all possible side effects. Call your doctor for medical advice about side effects. You may report side effects to FDA at 1-800-FDA-1088. Where should I keep my medicine? Keep out of reach of children. Store at room temperature between 15 and 30 degrees C (59 and 86 degrees F). Keep container tightly closed. Throw away any unused  medicine after the expiration date. NOTE: This sheet is a summary. It may not cover all possible information. If you have questions about this medicine, talk to your doctor, pharmacist, or health care provider.    2016, [employer] Standard. (2007-09-07 17:14:35) Pegfilgrastim injection What is this medicine? PEGFILGRASTIM (PEG fil gra stim) is a long-acting granulocyte colony-stimulating factor that stimulates the growth of neutrophils, a type of white blood cell important in the body's fight against infection. It is used to reduce the incidence of fever and infection in patients with certain types of cancer who are receiving chemotherapy that affects the bone marrow, and to increase survival after being exposed to high doses of radiation. This medicine may be used for other purposes; ask your health care provider or pharmacist if you have questions. What should I tell my health care provider before I take this medicine? They need to know if you have any of these conditions: -kidney disease -latex allergy -ongoing radiation therapy -sickle cell disease -skin reactions to acrylic adhesives (On-Body Injector only) -an unusual or allergic reaction to pegfilgrastim, filgrastim, other medicines, foods, dyes, or preservatives -pregnant or trying to get pregnant -breast-feeding How should I use this medicine? This medicine is for injection under the skin. If you get this medicine at home, you will be taught how to prepare and give the pre-filled syringe or how to use the On-body Injector. Refer to the patient Instructions for Use for detailed instructions. Use exactly as directed. Take your medicine at regular intervals. Do not take your medicine more often than directed. It is important that you put your used needles and syringes in a special sharps container. Do  not put them in a trash can. If you do not have a sharps container, call your pharmacist or healthcare provider to get one. Talk to your  pediatrician regarding the use of this medicine in children. While this drug may be prescribed for selected conditions, precautions do apply. Overdosage: If you think you have taken too much of this medicine contact a poison control center or emergency room at once. NOTE: This medicine is only for you. Do not share this medicine with others. What if I miss a dose? It is important not to miss your dose. Call your doctor or health care professional if you miss your dose. If you miss a dose due to an On-body Injector failure or leakage, a new dose should be administered as soon as possible using a single prefilled syringe for manual use. What may interact with this medicine? Interactions have not been studied. Give your health care provider a list of all the medicines, herbs, non-prescription drugs, or dietary supplements you use. Also tell them if you smoke, drink alcohol, or use illegal drugs. Some items may interact with your medicine. This list may not describe all possible interactions. Give your health care provider a list of all the medicines, herbs, non-prescription drugs, or dietary supplements you use. Also tell them if you smoke, drink alcohol, or use illegal drugs. Some items may interact with your medicine. What should I watch for while using this medicine? You may need blood work done while you are taking this medicine. If you are going to need a MRI, CT scan, or other procedure, tell your doctor that you are using this medicine (On-Body Injector only). What side effects may I notice from receiving this medicine? Side effects that you should report to your doctor or health care professional as soon as possible: -allergic reactions like skin rash, itching or hives, swelling of the face, lips, or tongue -dizziness -fever -pain, redness, or irritation at site where injected -pinpoint red spots on the skin -red or dark-brown urine -shortness of breath or breathing problems -stomach or  side pain, or pain at the shoulder -swelling -tiredness -trouble passing urine or change in the amount of urine Side effects that usually do not require medical attention (report to your doctor or health care professional if they continue or are bothersome): -bone pain -muscle pain This list may not describe all possible side effects. Call your doctor for medical advice about side effects. You may report side effects to FDA at 1-800-FDA-1088. Where should I keep my medicine? Keep out of the reach of children. Store pre-filled syringes in a refrigerator between 2 and 8 degrees C (36 and 46 degrees F). Do not freeze. Keep in carton to protect from light. Throw away this medicine if it is left out of the refrigerator for more than 48 hours. Throw away any unused medicine after the expiration date. NOTE: This sheet is a summary. It may not cover all possible information. If you have questions about this medicine, talk to your doctor, pharmacist, or health care provider.    2016, Elsevier/Gold Standard. (2014-03-24 14:30:14)

## 2014-12-29 ENCOUNTER — Inpatient Hospital Stay (HOSPITAL_COMMUNITY): Payer: Medicare Other

## 2014-12-29 ENCOUNTER — Encounter (HOSPITAL_BASED_OUTPATIENT_CLINIC_OR_DEPARTMENT_OTHER): Payer: Medicare Other

## 2014-12-29 ENCOUNTER — Encounter (HOSPITAL_COMMUNITY): Payer: Self-pay

## 2014-12-29 ENCOUNTER — Other Ambulatory Visit (HOSPITAL_COMMUNITY): Payer: Self-pay | Admitting: Oncology

## 2014-12-29 ENCOUNTER — Encounter (HOSPITAL_COMMUNITY)
Admission: RE | Admit: 2014-12-29 | Discharge: 2014-12-29 | Disposition: A | Discharge status: Home / Self Care | Payer: Medicare Other | Source: Ambulatory Visit | Attending: General Surgery | Admitting: General Surgery

## 2014-12-29 DIAGNOSIS — C20 Malignant neoplasm of rectum: Secondary | ICD-10-CM

## 2014-12-29 DIAGNOSIS — C7800 Secondary malignant neoplasm of unspecified lung: Secondary | ICD-10-CM | POA: Diagnosis not present

## 2014-12-29 MED ORDER — LOPERAMIDE HCL 2 MG PO CAPS: ORAL_CAPSULE | ORAL | Status: DC

## 2014-12-29 NOTE — Progress Notes (Signed)
Chemo teaching done with [name] Administrator @ [facility] & patient. Chemo instructions along with chemo consent to be faxed to [employer] Patient's Social Worker for [employer]. Distress screening done.

## 2014-12-29 NOTE — Patient Instructions (Signed)
Spoke with Juanita Craver, caretaker, about time to be here am of surgery, NPO, meds to take before surgery as well as 1/2 Lantus dosage and Metoprolol the night before surgery.

## 2014-12-30 ENCOUNTER — Other Ambulatory Visit (HOSPITAL_COMMUNITY): Payer: Self-pay | Admitting: Oncology

## 2014-12-30 ENCOUNTER — Encounter (HOSPITAL_COMMUNITY): Payer: Medicare Other

## 2014-12-30 NOTE — H&P (Signed)
  NTS SOAP Note  Vital Signs:  Vitals as of: 67/61/9509: Systolic 326: Diastolic 96: Heart Rate 97: Temp 46F: Height 34f 4in: Weight 232Lbs 0 Ounces: BMI 39.82  BMI : 39.82 kg/m2  Subjective: This 64year old female presents for of need for central venous access.  Is about to undergo chemotherapy for metastatic colon cancer.  Review of Symptoms:    Past Medical History:  Reviewed  Past Medical History  Surgical History: APR, TAH-BSO 5/15 Medical Problems: COPD, seizures, DM, high cholesterol, morbid obesity, HTN, rectal cancer Allergies: nkda Medications: protonix, singulair, claritin, lisinopril, metformin, insulin, advair, phenytoin, pravastatin, metoprolol   Social History:Reviewed  Social History  Preferred Language: English Race:  Other Ethnicity: Not Hispanic / Latino Age: [age]Years 10 Months Marital Status:  S   Smoking Status: Never smoker reviewed on 12/29/2014 Functional Status reviewed on 12/29/2014 ------------------------------------------------ Bathing: Normal Cooking: Normal Dressing: Normal Driving: Normal Eating: Normal Managing Meds: Normal Oral Care: Normal Shopping: Normal Toileting: Normal Transferring: Normal Walking: Normal Cognitive Status reviewed on 12/29/2014 ------------------------------------------------ Attention: Disablilty Decision Making: Disablilty Language: Normal Memory: Normal Motor: Normal Perception: Normal Problem Solving: Disablilty Visual and Spatial: Normal   Family History:Reviewed  Family Health History Family History is Unknown    Objective Information: General:Well appearing, well nourished in no distress. Heart:RRR, no murmur Lungs:  CTA bilaterally, no wheezes, rhonchi, rales.  Breathing unlabored. Abdomen:Soft, NT/ND, no HSM, no masses.  Ostomy patent.  Assessment:Metastatic colon cancer  Diagnoses: 154.8  C20 Malignant tumor of rectum (Malignant neoplasm of  rectum)  Procedures: 971245- OFFICE OUTPATIENT VISIT 15 MINUTES    Plan:  Scheduled for portacath insertion on 01/02/15.   Patient Education:Alternative treatments to surgery were discussed with patient (and family).  Risks and benefits  of procedure were fully explained to the patient (and POA) who gave informed consent. Patient/family questions were addressed.  Follow-up:Pending Surgery

## 2015-01-02 ENCOUNTER — Encounter (HOSPITAL_COMMUNITY): Payer: Self-pay | Admitting: *Deleted

## 2015-01-02 ENCOUNTER — Encounter (HOSPITAL_COMMUNITY)
Admission: RE | Disposition: A | Discharge status: Home / Self Care | Payer: Self-pay | Source: Ambulatory Visit | Attending: General Surgery

## 2015-01-02 ENCOUNTER — Ambulatory Visit (HOSPITAL_COMMUNITY): Payer: Medicare Other

## 2015-01-02 ENCOUNTER — Ambulatory Visit (HOSPITAL_COMMUNITY)
Admission: RE | Admit: 2015-01-02 | Discharge: 2015-01-02 | Disposition: A | Discharge status: Home / Self Care | Payer: Medicare Other | Source: Ambulatory Visit | Attending: General Surgery | Admitting: General Surgery

## 2015-01-02 ENCOUNTER — Ambulatory Visit (HOSPITAL_COMMUNITY): Payer: Medicare Other | Admitting: Anesthesiology

## 2015-01-02 DIAGNOSIS — I10 Essential (primary) hypertension: Secondary | ICD-10-CM | POA: Insufficient documentation

## 2015-01-02 DIAGNOSIS — Z7982 Long term (current) use of aspirin: Secondary | ICD-10-CM | POA: Insufficient documentation

## 2015-01-02 DIAGNOSIS — E7800 Pure hypercholesterolemia, unspecified: Secondary | ICD-10-CM | POA: Insufficient documentation

## 2015-01-02 DIAGNOSIS — Z6839 Body mass index (BMI) 39.0-39.9, adult: Secondary | ICD-10-CM | POA: Insufficient documentation

## 2015-01-02 DIAGNOSIS — E119 Type 2 diabetes mellitus without complications: Secondary | ICD-10-CM | POA: Diagnosis not present

## 2015-01-02 DIAGNOSIS — Z95828 Presence of other vascular implants and grafts: Secondary | ICD-10-CM

## 2015-01-02 DIAGNOSIS — Z87891 Personal history of nicotine dependence: Secondary | ICD-10-CM | POA: Diagnosis not present

## 2015-01-02 DIAGNOSIS — Z79899 Other long term (current) drug therapy: Secondary | ICD-10-CM | POA: Insufficient documentation

## 2015-01-02 DIAGNOSIS — M1990 Unspecified osteoarthritis, unspecified site: Secondary | ICD-10-CM | POA: Insufficient documentation

## 2015-01-02 DIAGNOSIS — Z794 Long term (current) use of insulin: Secondary | ICD-10-CM | POA: Diagnosis not present

## 2015-01-02 DIAGNOSIS — Z7984 Long term (current) use of oral hypoglycemic drugs: Secondary | ICD-10-CM | POA: Insufficient documentation

## 2015-01-02 DIAGNOSIS — C20 Malignant neoplasm of rectum: Secondary | ICD-10-CM | POA: Insufficient documentation

## 2015-01-02 DIAGNOSIS — J449 Chronic obstructive pulmonary disease, unspecified: Secondary | ICD-10-CM | POA: Diagnosis not present

## 2015-01-02 HISTORY — PX: PORTACATH PLACEMENT: SHX2246

## 2015-01-02 LAB — GLUCOSE, CAPILLARY
GLUCOSE-CAPILLARY: 83 mg/dL (ref 65–99)
GLUCOSE-CAPILLARY: 99 mg/dL (ref 65–99)

## 2015-01-02 SURGERY — INSERTION, TUNNELED CENTRAL VENOUS DEVICE, WITH PORT
Anesthesia: Monitor Anesthesia Care | Site: Chest | Laterality: Left

## 2015-01-02 MED ORDER — ONDANSETRON HCL 4 MG/2ML IJ SOLN
INTRAMUSCULAR | Status: AC
  Filled 2015-01-02: qty 2

## 2015-01-02 MED ORDER — HEPARIN SOD (PORK) LOCK FLUSH 100 UNIT/ML IV SOLN
INTRAVENOUS | Status: DC | PRN
  Administered 2015-01-02: 500 [IU]

## 2015-01-02 MED ORDER — CHLORHEXIDINE GLUCONATE 4 % EX LIQD
1.0000 | Freq: Once | CUTANEOUS | Status: DC
Start: 2015-01-02 — End: 2015-01-02

## 2015-01-02 MED ORDER — HEPARIN SOD (PORK) LOCK FLUSH 100 UNIT/ML IV SOLN
INTRAVENOUS | Status: AC
  Filled 2015-01-02: qty 5

## 2015-01-02 MED ORDER — VANCOMYCIN HCL IN DEXTROSE 1-5 GM/200ML-% IV SOLN
1000.0000 mg | Freq: Once | INTRAVENOUS | Status: AC
  Administered 2015-01-02: 1000 mg via INTRAVENOUS

## 2015-01-02 MED ORDER — SODIUM CHLORIDE 0.9 % IR SOLN
Status: DC | PRN
  Administered 2015-01-02: 500 mL

## 2015-01-02 MED ORDER — MIDAZOLAM HCL 2 MG/2ML IJ SOLN
1.0000 mg | INTRAMUSCULAR | Status: DC | PRN
  Administered 2015-01-02: 2 mg via INTRAVENOUS

## 2015-01-02 MED ORDER — HYDROCODONE-ACETAMINOPHEN 5-325 MG PO TABS: 1.0000 | ORAL_TABLET | ORAL | Status: DC | PRN

## 2015-01-02 MED ORDER — MUPIROCIN 2 % EX OINT
TOPICAL_OINTMENT | CUTANEOUS | Status: AC
  Filled 2015-01-02: qty 22

## 2015-01-02 MED ORDER — VANCOMYCIN HCL IN DEXTROSE 1-5 GM/200ML-% IV SOLN
INTRAVENOUS | Status: AC
  Filled 2015-01-02: qty 200

## 2015-01-02 MED ORDER — PROPOFOL 10 MG/ML IV BOLUS
INTRAVENOUS | Status: AC
  Filled 2015-01-02: qty 20

## 2015-01-02 MED ORDER — CEFAZOLIN SODIUM-DEXTROSE 2-3 GM-% IV SOLR: 2.0000 g | INTRAVENOUS | Status: DC

## 2015-01-02 MED ORDER — MUPIROCIN 2 % EX OINT
1.0000 "application " | TOPICAL_OINTMENT | Freq: Once | CUTANEOUS | Status: AC
  Administered 2015-01-02: 1 via TOPICAL

## 2015-01-02 MED ORDER — LIDOCAINE HCL (PF) 1 % IJ SOLN
INTRAMUSCULAR | Status: AC
  Filled 2015-01-02: qty 30

## 2015-01-02 MED ORDER — FENTANYL CITRATE (PF) 100 MCG/2ML IJ SOLN
25.0000 ug | INTRAMUSCULAR | Status: AC
  Administered 2015-01-02 (×2): 25 ug via INTRAVENOUS

## 2015-01-02 MED ORDER — ONDANSETRON HCL 4 MG/2ML IJ SOLN
4.0000 mg | Freq: Once | INTRAMUSCULAR | Status: AC
  Administered 2015-01-02: 4 mg via INTRAVENOUS

## 2015-01-02 MED ORDER — KETOROLAC TROMETHAMINE 30 MG/ML IJ SOLN
30.0000 mg | Freq: Once | INTRAMUSCULAR | Status: AC
  Administered 2015-01-02: 30 mg via INTRAVENOUS
  Filled 2015-01-02: qty 1

## 2015-01-02 MED ORDER — MIDAZOLAM HCL 2 MG/2ML IJ SOLN
INTRAMUSCULAR | Status: AC
  Filled 2015-01-02: qty 2

## 2015-01-02 MED ORDER — DEXTROSE 50 % IV SOLN
25.0000 mL | Freq: Once | INTRAVENOUS | Status: AC
  Administered 2015-01-02: 25 mL via INTRAVENOUS

## 2015-01-02 MED ORDER — LACTATED RINGERS IV SOLN
INTRAVENOUS | Status: DC
  Administered 2015-01-02: 08:00:00 via INTRAVENOUS

## 2015-01-02 MED ORDER — DEXTROSE 50 % IV SOLN: 25.0000 mL | Freq: Once | INTRAVENOUS | Status: DC

## 2015-01-02 MED ORDER — LIDOCAINE HCL (PF) 1 % IJ SOLN
INTRAMUSCULAR | Status: DC | PRN
  Administered 2015-01-02: 9 mL

## 2015-01-02 MED ORDER — FENTANYL CITRATE (PF) 100 MCG/2ML IJ SOLN
INTRAMUSCULAR | Status: AC
  Filled 2015-01-02: qty 2

## 2015-01-02 MED ORDER — ONDANSETRON HCL 4 MG/2ML IJ SOLN: 4.0000 mg | Freq: Once | INTRAMUSCULAR | Status: DC | PRN

## 2015-01-02 MED ORDER — PROPOFOL 500 MG/50ML IV EMUL
INTRAVENOUS | Status: DC | PRN
  Administered 2015-01-02: 75 ug/kg/min via INTRAVENOUS

## 2015-01-02 MED ORDER — DEXTROSE 50 % IV SOLN
INTRAVENOUS | Status: AC
  Filled 2015-01-02: qty 50

## 2015-01-02 MED ORDER — LIDOCAINE HCL 1 % IJ SOLN
INTRAMUSCULAR | Status: DC | PRN
  Administered 2015-01-02: 10 mg via INTRADERMAL

## 2015-01-02 MED ORDER — FENTANYL CITRATE (PF) 100 MCG/2ML IJ SOLN: 25.0000 ug | INTRAMUSCULAR | Status: DC | PRN

## 2015-01-02 MED ORDER — PROPOFOL 10 MG/ML IV BOLUS
INTRAVENOUS | Status: DC | PRN
  Administered 2015-01-02: 10 mg via INTRAVENOUS

## 2015-01-02 SURGICAL SUPPLY — 42 items
ADH SKN CLS APL DERMABOND .7 (GAUZE/BANDAGES/DRESSINGS) ×1
APPLIER CLIP 9.375 SM OPEN (CLIP)
APR CLP SM 9.3 20 MLT OPN (CLIP)
BAG DECANTER FOR FLEXI CONT (MISCELLANEOUS) ×3 IMPLANT
BAG HAMPER (MISCELLANEOUS) ×3 IMPLANT
CATH HICKMAN DUAL 12.0 (CATHETERS) IMPLANT
CHLORAPREP W/TINT 10.5 ML (MISCELLANEOUS) ×3 IMPLANT
CLIP APPLIE 9.375 SM OPEN (CLIP) IMPLANT
CLOTH BEACON ORANGE TIMEOUT ST (SAFETY) ×3 IMPLANT
COVER LIGHT HANDLE STERIS (MISCELLANEOUS) ×6 IMPLANT
DECANTER SPIKE VIAL GLASS SM (MISCELLANEOUS) ×3 IMPLANT
DERMABOND ADVANCED (GAUZE/BANDAGES/DRESSINGS) ×2
DERMABOND ADVANCED .7 DNX12 (GAUZE/BANDAGES/DRESSINGS) ×1 IMPLANT
DRAPE C-ARM FOLDED MOBILE STRL (DRAPES) ×3 IMPLANT
ELECT REM PT RETURN 9FT ADLT (ELECTROSURGICAL) ×3
ELECTRODE REM PT RTRN 9FT ADLT (ELECTROSURGICAL) ×1 IMPLANT
GLOVE BIO SURGEON STRL SZ7 (GLOVE) ×2 IMPLANT
GLOVE BIOGEL PI IND STRL 7.0 (GLOVE) IMPLANT
GLOVE BIOGEL PI INDICATOR 7.0 (GLOVE) ×2
GLOVE EXAM NITRILE PF MED BLUE (GLOVE) ×2 IMPLANT
GLOVE SURG SS PI 7.5 STRL IVOR (GLOVE) ×6 IMPLANT
GOWN STRL REUS W/TWL LRG LVL3 (GOWN DISPOSABLE) ×6 IMPLANT
IV NS 500ML (IV SOLUTION) ×3
IV NS 500ML BAXH (IV SOLUTION) ×1 IMPLANT
KIT PORT POWER 8FR ISP MRI (CATHETERS) ×3 IMPLANT
KIT PORT POWER ISP 8FR (Catheter) ×2 IMPLANT
KIT ROOM TURNOVER APOR (KITS) ×3 IMPLANT
LIQUID BAND (GAUZE/BANDAGES/DRESSINGS) ×2 IMPLANT
MANIFOLD NEPTUNE II (INSTRUMENTS) ×3 IMPLANT
NDL HYPO 25X1 1.5 SAFETY (NEEDLE) ×1 IMPLANT
NEEDLE HYPO 25X1 1.5 SAFETY (NEEDLE) ×3 IMPLANT
PACK MINOR (CUSTOM PROCEDURE TRAY) ×3 IMPLANT
PAD ARMBOARD 7.5X6 YLW CONV (MISCELLANEOUS) ×3 IMPLANT
SET BASIN LINEN APH (SET/KITS/TRAYS/PACK) ×3 IMPLANT
SET INTRODUCER 12FR PACEMAKER (SHEATH) IMPLANT
SHEATH COOK PEEL AWAY SET 8F (SHEATH) IMPLANT
SUT PROLENE 3 0 PS 2 (SUTURE) IMPLANT
SUT VIC AB 3-0 SH 27 (SUTURE) ×3
SUT VIC AB 3-0 SH 27X BRD (SUTURE) ×1 IMPLANT
SUT VIC AB 4-0 PS2 27 (SUTURE) ×3 IMPLANT
SYR 20CC LL (SYRINGE) ×3 IMPLANT
SYR CONTROL 10ML LL (SYRINGE) ×3 IMPLANT

## 2015-01-02 NOTE — Discharge Instructions (Signed)
Implanted Port Home Guide °An implanted port is a type of central line that is placed under the skin. Central lines are used to provide IV access when treatment or nutrition needs to be given through a person's veins. Implanted ports are used for long-term IV access. An implanted port may be placed because:  °· You need IV medicine that would be irritating to the small veins in your hands or arms.   °· You need long-term IV medicines, such as antibiotics.   °· You need IV nutrition for a long period.   °· You need frequent blood draws for lab tests.   °· You need dialysis.   °Implanted ports are usually placed in the chest area, but they can also be placed in the upper arm, the abdomen, or the leg. An implanted port has two main parts:  °· Reservoir. The reservoir is round and will appear as a small, raised area under your skin. The reservoir is the part where a needle is inserted to give medicines or draw blood.   °· Catheter. The catheter is a thin, flexible tube that extends from the reservoir. The catheter is placed into a large vein. Medicine that is inserted into the reservoir goes into the catheter and then into the vein.   °HOW WILL I CARE FOR MY INCISION SITE? °Do not get the incision site wet. Bathe or shower as directed by your health care provider.  °HOW IS MY PORT ACCESSED? °Special steps must be taken to access the port:  °· Before the port is accessed, a numbing cream can be placed on the skin. This helps numb the skin over the port site.   °· Your health care provider uses a sterile technique to access the port. °¨ Your health care provider must put on a mask and sterile gloves. °¨ The skin over your port is cleaned carefully with an antiseptic and allowed to dry. °¨ The port is gently pinched between sterile gloves, and a needle is inserted into the port. °· Only "non-coring" port needles should be used to access the port. Once the port is accessed, a blood return should be checked. This helps  ensure that the port is in the vein and is not clogged.   °· If your port needs to remain accessed for a constant infusion, a clear (transparent) bandage will be placed over the needle site. The bandage and needle will need to be changed every week, or as directed by your health care provider.   °· Keep the bandage covering the needle clean and dry. Do not get it wet. Follow your health care provider's instructions on how to take a shower or bath while the port is accessed.   °· If your port does not need to stay accessed, no bandage is needed over the port.   °WHAT IS FLUSHING? °Flushing helps keep the port from getting clogged. Follow your health care provider's instructions on how and when to flush the port. Ports are usually flushed with saline solution or a medicine called heparin. The need for flushing will depend on how the port is used.  °· If the port is used for intermittent medicines or blood draws, the port will need to be flushed:   °¨ After medicines have been given.   °¨ After blood has been drawn.   °¨ As part of routine maintenance.   °· If a constant infusion is running, the port may not need to be flushed.   °HOW LONG WILL MY PORT STAY IMPLANTED? °The port can stay in for as long as your health care   provider thinks it is needed. When it is time for the port to come out, surgery will be done to remove it. The procedure is similar to the one performed when the port was put in.  WHEN SHOULD I SEEK IMMEDIATE MEDICAL CARE? When you have an implanted port, you should seek immediate medical care if:   You notice a bad smell coming from the incision site.   You have swelling, redness, or drainage at the incision site.   You have more swelling or pain at the port site or the surrounding area.   You have a fever that is not controlled with medicine.   This information is not intended to replace advice given to you by your health care provider. Make sure you discuss any questions you have with  your health care provider.   Document Released: 03/04/2005 Document Revised: 12/23/2012 Document Reviewed: 11/09/2012 Elsevier Interactive Patient Education 2016 [place] Insertion, Care After Refer to this sheet in the next few weeks. These instructions provide you with information on caring for yourself after your procedure. Your health care provider may also give you more specific instructions. Your treatment has been planned according to current medical practices, but problems sometimes occur. Call your health care provider if you have any problems or questions after your procedure. WHAT TO EXPECT AFTER THE PROCEDURE After your procedure, it is typical to have the following:   Discomfort at the port insertion site. Ice packs to the area will help.  Bruising on the skin over the port. This will subside in 3-4 days. HOME CARE INSTRUCTIONS  After your port is placed, you will get a manufacturer's information card. The card has information about your port. Keep this card with you at all times.   Know what kind of port you have. There are many types of ports available.   Wear a medical alert bracelet in case of an emergency. This can help alert health care workers that you have a port.   The port can stay in for as long as your health care provider believes it is necessary.   A home health care nurse may give medicines and take care of the port.   You or a family member can get special training and directions for giving medicine and taking care of the port at home.  SEEK MEDICAL CARE IF:   Your port does not flush or you are unable to get a blood return.   You have a fever or chills. SEEK IMMEDIATE MEDICAL CARE IF:  You have new fluid or pus coming from your incision.   You notice a bad smell coming from your incision site.   You have swelling, pain, or more redness at the incision or port site.   You have chest pain or shortness of breath.   This  information is not intended to replace advice given to you by your health care provider. Make sure you discuss any questions you have with your health care provider.   Document Released: 12/23/2012 Document Revised: 03/09/2013 Document Reviewed: 12/23/2012 Elsevier Interactive Patient Education [employer].

## 2015-01-02 NOTE — Transfer of Care (Signed)
Immediate Anesthesia Transfer of Care Note  Patient: Miriam Liles  Procedure(s) Performed: Procedure(s): INSERTION PORT-A-CATH (Left)  Patient Location: PACU  Anesthesia Type:MAC  Level of Consciousness: awake, alert  and oriented  Airway & Oxygen Therapy: Patient Spontanous Breathing and Patient connected to nasal cannula oxygen  Post-op Assessment: Report given to RN  Post vital signs: Reviewed and stable  Last Vitals:  Filed Vitals:   01/02/15 0850  BP: 147/74  Pulse:   Temp:   Resp: 14    Complications: No apparent anesthesia complications

## 2015-01-02 NOTE — Op Note (Signed)
Patient:  [name]  DOB:  05/13/1950  MRN:  [id number]   Preop Diagnosis:  Rectal carcinoma, need for central venous access  Postop Diagnosis:  Same  Procedure:  Port-A-Cath insertion  Surgeon:  Aviva Signs, M.D.  Anes:  Mac  Indications:  Patient is a 64 year old black female who is about to undergo chemotherapy for metastatic rectal carcinoma. The risks and benefits of the procedure including bleeding, infection, and pneumothorax were fully explained to the patient and her power of attorney, who gave informed consent.  Procedure note:  The patient was placed in the Trendelenburg position after the left upper chest was prepped and draped using the usual sterile technique with DuraPrep. Surgical site confirmation was performed. 1% Xylocaine was used for local anesthesia.  An incision was made below left clavicle. Subcutaneous pocket was formed. A needle is advanced into left subclavian vein using the Seldinger technique. A guidewire was then advanced under fluoroscopic guidance into the right atrium. An introducer peel-away sheath were placed over the guidewire. The catheter was then inserted through the peel-away sheath the peel-away sheath was removed. The catheter was then attached to the port and the port placed in its subcutaneous pocket. Adequate positioning was confirmed by fluoroscopy. Good backflow of blood was noted from the port. The port was flushed with heparin flush. The subcutaneous layer was reapproximated using a 3-0 Vicryl interrupted suture. The skin was closed using a 4-0 Vicryl subcuticular suture. Dermabond was applied.  All tape and needle counts were correct at the end of the procedure. The patient was transferred to PACU in stable condition. A chest x-ray will be performed at that time.  Complications:  None  EBL:  Minimal  Specimen:  None

## 2015-01-02 NOTE — Interval H&P Note (Signed)
History and Physical Interval Note:  01/02/2015 8:18 AM  [name]  has presented today for surgery, with the diagnosis of metastatic rectal cancer  The various methods of treatment have been discussed with the patient and family. After consideration of risks, benefits and other options for treatment, the patient has consented to  Procedure(s): INSERTION PORT-A-CATH (N/A) as a surgical intervention .  The patient's history has been reviewed, patient examined, no change in status, stable for surgery.  I have reviewed the patient's chart and labs.  Questions were answered to the patient's satisfaction.     Aviva Signs A

## 2015-01-02 NOTE — OR Nursing (Signed)
Patient instructed via  Telephone call to take these meds on the morning of surgery with a sip of water. Amlodipine, Pepcid, Hydrocodone ( or Ultram) Lisiopril, and protonix with a sip of water.

## 2015-01-02 NOTE — Anesthesia Preprocedure Evaluation (Signed)
Anesthesia Evaluation  Patient identified by MRN, date of birth, ID band Patient awake    Reviewed: Allergy & Precautions, NPO status , Patient's Chart, lab work & pertinent test results, reviewed documented beta blocker date and time   Airway Mallampati: III  TM Distance: >3 FB Neck ROM: Full    Dental   Pulmonary shortness of breath and with exertion, asthma , former smoker,    breath sounds clear to auscultation       Cardiovascular hypertension, Pt. on medications and Pt. on home beta blockers + dysrhythmias  Rhythm:Regular Rate:Normal     Neuro/Psych Seizures -,  PSYCHIATRIC DISORDERS Depression Mental retardation    GI/Hepatic GERD  ,  Endo/Other  diabetes, Type 2, Oral Hypoglycemic AgentsMorbid obesity  Renal/GU CRFRenal disease     Musculoskeletal  (+) Arthritis ,   Abdominal   Peds  Hematology  (+) anemia ,   Anesthesia Other Findings   Reproductive/Obstetrics                             Anesthesia Physical Anesthesia Plan  ASA: IV  Anesthesia Plan: MAC   Post-op Pain Management:    Induction: Intravenous  Airway Management Planned: Simple Face Mask  Additional Equipment:   Intra-op Plan:   Post-operative Plan:   Informed Consent: I have reviewed the patients History and Physical, chart, labs and discussed the procedure including the risks, benefits and alternatives for the proposed anesthesia with the patient or authorized representative who has indicated his/her understanding and acceptance.     Plan Discussed with:   Anesthesia Plan Comments:         Anesthesia Quick Evaluation

## 2015-01-02 NOTE — OR Nursing (Signed)
Colostomy  To left lower  Abdomen  Intact.

## 2015-01-02 NOTE — Progress Notes (Signed)
CXR done

## 2015-01-03 ENCOUNTER — Encounter (HOSPITAL_COMMUNITY): Payer: Self-pay | Admitting: General Surgery

## 2015-01-03 NOTE — Anesthesia Postprocedure Evaluation (Signed)
  Anesthesia Post-op Note  Chart review Patient: [name]  Procedure(s) Performed: Procedure(s): INSERTION PORT-A-CATH (Left)  Patient Location: PACU  Anesthesia Type:MAC  Level of Consciousness: awake, alert , oriented and patient cooperative  Airway and Oxygen Therapy: Patient Spontanous Breathing  Post-op Pain: none  Post-op Assessment: Post-op Vital signs reviewed, Patient's Cardiovascular Status Stable, Respiratory Function Stable, Patent Airway, No signs of Nausea or vomiting and Pain level controlled              Post-op Vital Signs: Reviewed and stable  Last Vitals:  Filed Vitals:   01/02/15 1033  BP: 147/73  Pulse: 86  Temp: 36.4 C  Resp: 18    Complications: No apparent anesthesia complications

## 2015-01-04 ENCOUNTER — Encounter (HOSPITAL_BASED_OUTPATIENT_CLINIC_OR_DEPARTMENT_OTHER): Payer: Medicare Other

## 2015-01-04 ENCOUNTER — Encounter (HOSPITAL_BASED_OUTPATIENT_CLINIC_OR_DEPARTMENT_OTHER): Payer: Medicare Other | Admitting: Hematology & Oncology

## 2015-01-04 ENCOUNTER — Other Ambulatory Visit (HOSPITAL_COMMUNITY): Payer: Self-pay | Admitting: Oncology

## 2015-01-04 ENCOUNTER — Encounter (HOSPITAL_COMMUNITY): Payer: Self-pay | Admitting: Hematology & Oncology

## 2015-01-04 VITALS — BP 186/68 | HR 97 | Temp 98.0°F | Resp 18 | Wt 234.8 lb

## 2015-01-04 VITALS — BP 152/67 | HR 91 | Temp 98.4°F | Resp 16

## 2015-01-04 DIAGNOSIS — C7801 Secondary malignant neoplasm of right lung: Secondary | ICD-10-CM | POA: Diagnosis not present

## 2015-01-04 DIAGNOSIS — N183 Chronic kidney disease, stage 3 unspecified: Secondary | ICD-10-CM

## 2015-01-04 DIAGNOSIS — C7800 Secondary malignant neoplasm of unspecified lung: Secondary | ICD-10-CM | POA: Diagnosis not present

## 2015-01-04 DIAGNOSIS — D649 Anemia, unspecified: Secondary | ICD-10-CM | POA: Diagnosis present

## 2015-01-04 DIAGNOSIS — C20 Malignant neoplasm of rectum: Secondary | ICD-10-CM

## 2015-01-04 DIAGNOSIS — R911 Solitary pulmonary nodule: Secondary | ICD-10-CM

## 2015-01-04 DIAGNOSIS — E611 Iron deficiency: Secondary | ICD-10-CM

## 2015-01-04 DIAGNOSIS — E1165 Type 2 diabetes mellitus with hyperglycemia: Secondary | ICD-10-CM | POA: Diagnosis not present

## 2015-01-04 DIAGNOSIS — C19 Malignant neoplasm of rectosigmoid junction: Secondary | ICD-10-CM

## 2015-01-04 DIAGNOSIS — Z794 Long term (current) use of insulin: Secondary | ICD-10-CM

## 2015-01-04 LAB — GLUCOSE, RANDOM: GLUCOSE: 192 mg/dL — AB (ref 65–99)

## 2015-01-04 LAB — IRON AND TIBC
Iron: 64 ug/dL (ref 28–170)
SATURATION RATIOS: 27 % (ref 10.4–31.8)
TIBC: 235 ug/dL — ABNORMAL LOW (ref 250–450)
UIBC: 171 ug/dL

## 2015-01-04 LAB — CBC WITH DIFFERENTIAL/PLATELET
BASOS PCT: 0 %
Basophils Absolute: 0 10*3/uL (ref 0.0–0.1)
EOS ABS: 0.3 10*3/uL (ref 0.0–0.7)
Eosinophils Relative: 6 %
HEMATOCRIT: 30.3 % — AB (ref 36.0–46.0)
HEMOGLOBIN: 9.7 g/dL — AB (ref 12.0–15.0)
LYMPHS ABS: 0.5 10*3/uL — AB (ref 0.7–4.0)
Lymphocytes Relative: 11 %
MCH: 28 pg (ref 26.0–34.0)
MCHC: 32 g/dL (ref 30.0–36.0)
MCV: 87.6 fL (ref 78.0–100.0)
MONOS PCT: 7 %
Monocytes Absolute: 0.3 10*3/uL (ref 0.1–1.0)
NEUTROS ABS: 3.7 10*3/uL (ref 1.7–7.7)
NEUTROS PCT: 76 %
Platelets: 170 10*3/uL (ref 150–400)
RBC: 3.46 MIL/uL — AB (ref 3.87–5.11)
RDW: 13.5 % (ref 11.5–15.5)
WBC: 4.9 10*3/uL (ref 4.0–10.5)

## 2015-01-04 LAB — COMPREHENSIVE METABOLIC PANEL
ALK PHOS: 222 U/L — AB (ref 38–126)
ALT: 17 U/L (ref 14–54)
AST: 21 U/L (ref 15–41)
Albumin: 3.6 g/dL (ref 3.5–5.0)
Anion gap: 8 (ref 5–15)
BUN: 42 mg/dL — AB (ref 6–20)
CO2: 26 mmol/L (ref 22–32)
CREATININE: 1.17 mg/dL — AB (ref 0.44–1.00)
Calcium: 8.3 mg/dL — ABNORMAL LOW (ref 8.9–10.3)
Chloride: 105 mmol/L (ref 101–111)
GFR, EST AFRICAN AMERICAN: 56 mL/min — AB (ref >60)
GFR, EST NON AFRICAN AMERICAN: 49 mL/min — AB (ref >60)
Glucose, Bld: 187 mg/dL — ABNORMAL HIGH (ref 65–99)
Potassium: 4.6 mmol/L (ref 3.5–5.1)
SODIUM: 139 mmol/L (ref 135–145)
Total Bilirubin: 0.3 mg/dL (ref 0.3–1.2)
Total Protein: 6.8 g/dL (ref 6.5–8.1)

## 2015-01-04 LAB — VITAMIN B12: VITAMIN B 12: 196 pg/mL (ref 180–914)

## 2015-01-04 LAB — FERRITIN: Ferritin: 62 ng/mL (ref 11–307)

## 2015-01-04 LAB — FOLATE: FOLATE: 35 ng/mL (ref >5.9)

## 2015-01-04 MED ORDER — DEXTROSE 5 % IV SOLN
Freq: Once | INTRAVENOUS | Status: AC
  Administered 2015-01-04: 11:00:00 via INTRAVENOUS

## 2015-01-04 MED ORDER — SODIUM CHLORIDE 0.9 % IJ SOLN: 10.0000 mL | INTRAMUSCULAR | Status: DC | PRN

## 2015-01-04 MED ORDER — LEUCOVORIN CALCIUM INJECTION 350 MG
400.0000 mg/m2 | Freq: Once | INTRAVENOUS | Status: AC
  Administered 2015-01-04: 868 mg via INTRAVENOUS
  Filled 2015-01-04: qty 43.4

## 2015-01-04 MED ORDER — FLUOROURACIL CHEMO INJECTION 2.5 GM/50ML
400.0000 mg/m2 | Freq: Once | INTRAVENOUS | Status: AC
  Administered 2015-01-04: 850 mg via INTRAVENOUS
  Filled 2015-01-04: qty 17

## 2015-01-04 MED ORDER — SODIUM CHLORIDE 0.9 % IV SOLN
Freq: Once | INTRAVENOUS | Status: AC
  Administered 2015-01-04: 11:00:00 via INTRAVENOUS
  Filled 2015-01-04: qty 4

## 2015-01-04 MED ORDER — INSULIN ASPART 100 UNIT/ML ~~LOC~~ SOLN
4.0000 [IU] | SUBCUTANEOUS | Status: AC
  Administered 2015-01-04: 4 [IU] via SUBCUTANEOUS
  Filled 2015-01-04: qty 0.04

## 2015-01-04 MED ORDER — SODIUM CHLORIDE 0.9 % IV SOLN
2400.0000 mg/m2 | INTRAVENOUS | Status: DC
  Administered 2015-01-04: 5200 mg via INTRAVENOUS
  Filled 2015-01-04: qty 104

## 2015-01-04 MED ORDER — INSULIN ASPART 100 UNIT/ML ~~LOC~~ SOLN
2.0000 [IU] | SUBCUTANEOUS | Status: AC
  Administered 2015-01-04: 2 [IU] via SUBCUTANEOUS
  Filled 2015-01-04: qty 0.02

## 2015-01-04 MED ORDER — OXALIPLATIN CHEMO INJECTION 100 MG/20ML
85.0000 mg/m2 | Freq: Once | INTRAVENOUS | Status: AC
  Administered 2015-01-04: 185 mg via INTRAVENOUS
  Filled 2015-01-04: qty 37

## 2015-01-04 NOTE — Patient Instructions (Signed)
[facility] Discharge Instructions for Patients Receiving Chemotherapy  Today you received the following chemotherapy agents Oxaliplatin, leucovorin and 5FU pump. You also had Novolog 4 units insulin 1030 am and Novolog 2 units at 2 pm prior to leaving today. Monitor blood sugars as instructed.  To help prevent nausea and vomiting after your treatment, we encourage you to take your nausea medication as instructed. If you develop nausea and vomiting that is not controlled by your nausea medication, call the clinic. If it is after clinic hours your family physician or the after hours number for the clinic or go to the Emergency Department. BELOW ARE SYMPTOMS THAT SHOULD BE REPORTED IMMEDIATELY:  *FEVER GREATER THAN 101.0 F  *CHILLS WITH OR WITHOUT FEVER  NAUSEA AND VOMITING THAT IS NOT CONTROLLED WITH YOUR NAUSEA MEDICATION  *UNUSUAL SHORTNESS OF BREATH  *UNUSUAL BRUISING OR BLEEDING  TENDERNESS IN MOUTH AND THROAT WITH OR WITHOUT PRESENCE OF ULCERS  *URINARY PROBLEMS  *BOWEL PROBLEMS  UNUSUAL RASH Items with * indicate a potential emergency and should be followed up as soon as possible.  Return to clinic 1130 am Friday October 21 st.  I have been informed and understand all the instructions given to me. I know to contact the clinic, my physician, or go to the Emergency Department if any problems should occur. I do not have any questions at this time, but understand that I may call the clinic during office hours or the Patient Navigator at [phone] should I have any questions or need assistance in obtaining follow up care.    __________________________________________  _____________  __________ Signature of Patient or Authorized Representative            Date                   Time    __________________________________________ Nurse's Signature

## 2015-01-04 NOTE — Progress Notes (Signed)
Jennifer Bogus, MD [name] [id number]  History of Locally advanced Rectal Cancer Recurrent disease with solitary pulmonary metastases, s/p resection  CURRENT THERAPY: FOLFOX adjuvant  INTERVAL HISTORY: [name] 64 y.o. female returns for followup of Locally advanced adenocarcinoma of the rectum, She tolerated combined modality therapy well. Radiation therapy completed on 05/04/2013 which was also the last day she received Xeloda, excellent tolerance, status post APR on 07/26/2013 with marked cytoreduction documented as evidenced by no lymph node involvement. pT3 tumor was found. S/P 6 months of Xeloda at 1000 mg BID 2 weeks on and 1 week off finishing in December 2015.   She has just undergone successful resection of a solitary pulmonary nodule, unfortunately pathology was consistent with recurrent colorectal cancer.  She lives at [facility] off of [address]. She was eventually seen with one of her guardian social workers. The patient's diagnosis and treatment options were discussed. She is now 8 weeks out from surgery.  Her social worker notes that the patient is adament to proceed with treatment.  She states that she understands and will notify if she was having any symptoms during treatment.  She understands what she will experience during treatment.  [name]'s Education officer, museum states that she has known the patient for many years and this is truly her wish to try additional therapy.  Today [name], is here alone today waiting on treatment. She states that her port is not hurting her.  [name] is relaxed, working on a word search puzzle. She says "I came out of my surgery in no time," and makes friendly comments.  She denies having any questions today and overall seems well.     Rectal cancer metastasized to lung ([place])   02/18/2013 Initial Diagnosis Rectal cancer   03/25/2013 - 05/05/2013 Radiation Therapy Pelvis treatment from  1/8- 2/12 with rectal boost from 2/13- 05/05/2013.   03/25/2013 - 05/05/2013 Chemotherapy Xeloda 1500 mg BID 5 days/week with radiation therapy.   07/26/2013 Definitive Surgery Dr. Arnoldo Morale- Flexible sigmoidoscopy, abdominoperineal resection, total abdominal hysterectomy with bilateral salpingo-oophorectomy   08/30/2013 - 03/01/2014 Chemotherapy Xeloda 1000 mg BID 14 days on and 7 days off x 6 months   03/08/2014 Imaging CT CAP- Bilateral pulmonary nodules, including an 8 mm left lower lobe nodule. Metastatic disease is a concern. The left lower lobe nodule has progressed since 03/05/2013, when it measured 5 mm.   05/26/2014 Imaging CT Chest- Continued enlargement of the superior segment left lower lobe nodule. Malignancy is likely.   In contrast, the right lower lobe nodule is probably benign.   08/17/2014 Imaging CT- Superior segment left lower lobe pulmonary nodule measures stable since the most recent comparison study, but is again noted to have increased in size when comparing to older studies. Continued close attention will be required as neoplasm remains a con   10/26/2014 Surgery thoracoscopic left lower lobe superior segmentectomy. Pathology with metastatic adenocarcinoma c/w colonic primary       Past Medical History  Diagnosis Date  . Hypertension   . Diabetes mellitus     years  . Asthma   . Depression   . Mental retardation     stopped school at 9th grade   . History of recurrent UTIs   . Shortness of breath     with exertion  . Cancer (HCC)     Rectal   . Arthritis   . Gout   . Seizures ([place])  more than 4 yrs since last seizure. UNknown etiology  . Rectal cancer ([place])   . Iron deficiency 04/27/2014  . Chronic renal disease, stage 3, moderately decreased glomerular filtration rate between 30-59 mL/min/1.73 square meter 04/27/2014  . Numbness and tingling in hands     x several months   . 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     has No Known Allergies.  [name] not currently have medications on file.  Past Surgical History  Procedure Laterality Date  . Abdominal hysterectomy    . Multiple extractions with alveoloplasty N/A 11/23/2012    Procedure: MULTIPLE EXTRACION 1, 2, 4, 5, 6, 7, 8, 9, 10, 11, 12, 13, 14, 17, 18, 20, 23, 24, 25, 26, 28, 29, 32 WITH ALVEOLOPLASTY, REMOVE BILATERAL TORI;  Surgeon: SGae Bon DDS;  [place]  Service: Oral Surgery;  Laterality: N/A;  . Colonoscopy with esophagogastroduodenoscopy (egd) N/A 01/14/2013    Procedure: COLONOSCOPY WITH ESOPHAGOGASTRODUODENOSCOPY (EGD);  Surgeon: NRogene Houston MD;  [facility];  Service: Endoscopy;  Laterality: N/A;  250-moved to 315 Ann to notify pt  . Colonoscopy N/A 02/04/2013    Procedure: COLONOSCOPY;  Surgeon: NRogene Houston MD;  [facility];  Service: Endoscopy;  Laterality: N/A;  225  . Eus N/A 02/18/2013    Procedure: LOWER ENDOSCOPIC ULTRASOUND (EUS);  Surgeon: DMilus Banister MD;  [facility];  Service: Endoscopy;  Laterality: N/A;  . Flexible sigmoidoscopy N/A 07/26/2013    Procedure: FLEXIBLE SIGMOIDOSCOPY;  Surgeon: MJamesetta So MD;  [facility];  Service: General;  Laterality: N/A;  .  Abdominal perineal bowel resection N/A 07/26/2013    Procedure:  ABDOMINAL PERINEAL RESECTION;  Surgeon: MJamesetta So MD;  [facility];  Service: General;  Laterality: N/A;  . Supracervical abdominal hysterectomy N/A 07/26/2013    Procedure: HYSTERECTOMY SUPRACERVICAL ABDOMINAL ;  Surgeon: MJamesetta So MD;  [facility];  Service: General;  Laterality: N/A;  . Salpingoophorectomy Bilateral 07/26/2013    Procedure: SALPINGO OOPHORECTOMY;  Surgeon: MJamesetta So MD;  [facility];  Service: General;  Laterality: Bilateral;  . Colostomy Left 07/26/2013    Procedure: COLOSTOMY;  Surgeon: MJamesetta So MD;  [facility];  Service: General;  Laterality: Left;  . Colonoscopy N/A 05/12/2014    Procedure: COLONOSCOPY;  Surgeon: NRogene Houston MD;  [facility];  Service: Endoscopy;  Laterality: N/A;  1030  . Colon surgery  2015    bowel resection/w colostomy  . Video assisted thoracoscopy Left 10/26/2014    Procedure: LEFT VIDEO ASSISTED THORACOSCOPY;  Surgeon: SMelrose Nakayama MD;  [place]  Service: Thoracic;  Laterality: Left;  . Segmentecomy Left 10/26/2014    Procedure: LEFT LOWER LOBE SUPERIOR SEGMENTECTOMY;  Surgeon: SMelrose Nakayama MD;  [place]  Service: Thoracic;  Laterality: Left;  . Lymph node dissection  Left 10/26/2014    Procedure: LYMPH NODE DISSECTION;  Surgeon: [name], MD;  Location: [place];  Service: Thoracic;  Laterality: Left;  . Portacath placement Left 01/02/2015    Procedure: INSERTION PORT-A-CATH;  Surgeon: Aviva Signs, MD;  [facility];  Service: General;  Laterality: Left;    Denies any headaches, dizziness, double vision, fevers, chills, night sweats, nausea, vomiting, diarrhea, constipation, chest pain, heart palpitations, shortness of breath, blood in stool, black tarry stool, urinary pain, urinary burning, urinary frequency, hematuria. 14 point review of systems was performed and is negative  except as detailed under history of present illness and above  PHYSICAL EXAMINATION  ECOG PERFORMANCE STATUS: 1  Filed Vitals:   01/04/15 0841  BP: 186/68  Pulse: 97  Temp: 98 F (36.7 C)  Resp: 18    Filed Weights   01/04/15 0841  Weight: 234 lb 12.8 oz (106.505 kg)    GENERAL:alert, no distress, well nourished, well developed, comfortable, cooperative, obese, smiling   Surgical sites are beautifully healed on her left side. SKIN: skin color, texture, turgor are normal, no rashes or significant lesions HEAD: Normocephalic, No masses, lesions, tenderness or abnormalities EYES: normal, PERRLA, EOMI, Conjunctiva are pink and non-injected EARS: External ears normal OROPHARYNX:lips, buccal mucosa, and tongue normal and mucous membranes are moist  NECK: supple, no adenopathy, thyroid normal size, non-tender, without nodularity, no stridor, non-tender, trachea midline LYMPH:  no palpable lymphadenopathy BREAST:not examined LUNGS: clear to auscultation  HEART: regular rate & rhythm, no murmurs and no gallops ABDOMEN:abdomen soft, obese and normal bowel sounds BACK: Back symmetric, no curvature., No CVA tenderness EXTREMITIES:less then 2 second capillary refill, no joint deformities, effusion, or inflammation, no skin discoloration, no clubbing, no cyanosis  NEURO: alert & oriented x 3 with fluent speech, no focal motor/sensory deficits, gait normal   LABORATORY DATA: I have reviewed the results listed below  CBC    Component Value Date/Time   WBC 4.9 01/04/2015 0850   RBC 3.46* 01/04/2015 0850   RBC 3.84* 11/07/2009 1649   HGB 9.7* 01/04/2015 0850   HCT 30.3* 01/04/2015 0850   PLT 170 01/04/2015 0850   MCV 87.6 01/04/2015 0850   MCH 28.0 01/04/2015 0850   MCHC 32.0 01/04/2015 0850   RDW 13.5 01/04/2015 0850   LYMPHSABS 0.5* 01/04/2015 0850   MONOABS 0.3 01/04/2015 0850   EOSABS 0.3 01/04/2015 0850   BASOSABS 0.0 01/04/2015 0850   CMP     Component Value Date/Time     NA 139 01/04/2015 0850   K 4.6 01/04/2015 0850   CL 105 01/04/2015 0850   CO2 26 01/04/2015 0850   GLUCOSE 192* 01/04/2015 1240   BUN 42* 01/04/2015 0850   CREATININE 1.17* 01/04/2015 0850   CREATININE 1.11* 02/08/2013 1451   CALCIUM 8.3* 01/04/2015 0850   PROT 6.8 01/04/2015 0850   ALBUMIN 3.6 01/04/2015 0850   AST 21 01/04/2015 0850   ALT 17 01/04/2015 0850   ALKPHOS 222* 01/04/2015 0850   BILITOT 0.3 01/04/2015 0850   GFRNONAA 49* 01/04/2015 0850   GFRNONAA 54* 02/08/2013 1451   GFRAA 56* 01/04/2015 0850   GFRAA 62 02/08/2013 1451      PATHOLOGY REPORT OF SURGICAL PATHOLOGY FIAL DIAGNOSIS Diagnosis 1. Lung, wedge biopsy/resection, superior segment of left lower lobe - METASTATIC ADENOCARCINOMA, CONSISTENT WITH COLONIC PRIMARY, SPANNING 1.5 CM. - LYMPHOVASCULAR INVASION IS IDENTIFIED. - THE SURGICAL RESECTION MARGINS ARE NEGATIVE FOR CARCINOMA. - SEE COMMENT. 2. Lymph node, biopsy, 9 L -  THERE IS NO EVIDENCE OF CARCINOMA IN 1 OF 1 LYMPH NODE (0/1). 3. Lymph node, biopsy, 9 L #2 - THERE IS NO EVIDENCE OF CARCINOMA IN 1 OF 1 LYMPH NODE (0/1). 4. Lymph node, biopsy, 11 L - THERE IS NO EVIDENCE OF CARCINOMA IN 1 OF 1 LYMPH NODE (0/1). 5. Lymph node, biopsy, 5 - THERE IS NO EVIDENCE OF CARCINOMA IN 1 OF 1 LYMPH NODE (0/1). Microscopic Comment 1. A block will be sent for KRAS testing and the results reported separately. Additional testing can be performed upon clinician request. (JBK:ds 10/27/14) [name], Electronic Signature (Case signed 10/27/2014) Intraoperative    RADIOGRAPHIC STUDIES:  CLINICAL DATA: Subsequent treatment strategy for restaging rectal cancer. Enlarging pulmonary nodule.  EXAM: NUCLEAR MEDICINE PET SKULL BASE TO THIGH COMPARISON: PET of 03/05/2013. Chest CTs of 08/17/14 and 05/26/2014.  IMPRESSION: 1. Hypermetabolism corresponding to the superior segment left lower lobe lung nodule. This could represent a primary  bronchogenic carcinoma or isolated metastatic lesion. Primary bronchogenic carcinomas slightly favored. 2. No evidence of hypermetabolic metastatic disease. 3. Left axillary node is small and demonstrates low-level non malignant range hypermetabolism. Favor reactive etiology. 4. Heterogeneous marrow density and metabolism. The CT appearance has been present back to 2010. Favor Paget's disease. An area of more focal hypermetabolism in the left side of the sacrum is nonspecific and without focal CT correlate. Metastatic disease is felt unlikely but cannot be entirely excluded. Recommend attention on follow-up.   Electronically Signed  By: Abigail Miyamoto M.D.  On: 10/07/2014 13:02  ASSESSMENT AND PLAN:  History of locally advanced rectal cancer Hypermetabolic pulmonary nodule Prior tobacco abuse Pagets disease of bone Anemia CKD CEA was elevated at time of initial diagnosis in 2014, but not with recent recurrence Limited decision making capacity  She has undergone successful surgical resection of the solitary pulmonary nodule. Unfortunately pathology was c/w recurrent CRC. The patient and her caregivers have opted to proceed with therapy, we are going to try FOLFOX. The patient did exceptionally well with prior therapy and will have close observation. I am hopeful she should do well.  I again discussed ongoing observation but her risk of relapse is of course significant. Studies regarding resection of a solitary metastases in CRC are more limited to hepatic metastatic disease.  She had an APR for her rectal cancer. I do not see where she has had recent endoscopy and we will need to address this moving forward.  Last available mammogram is from 08/16/2013, this will also need to be addressed at follow-up.   She has a chronic anemia that has not been aggressively evaluated. This may become problematic throughout therapy and at her next follow-up we will proceed with a basic anemia  evaluation.  She will return in one week for further assessment.  All questions were answered. The patient knows to call the clinic with any problems, questions or concerns. We can certainly see the patient much sooner if necessary.   This document serves as a record of services personally performed by Ancil Linsey, MD. It was created on her behalf by Toni Amend, a trained medical scribe. The creation of this record is based on the scribe's personal observations and the provider's statements to them. This document has been checked and approved by the attending provider.  I have reviewed the above documentation for accuracy and completeness, and I agree with the above.  This note was electronically signed.  Kelby Fam. Whitney Muse, MD

## 2015-01-04 NOTE — Patient Instructions (Signed)
..  [name] at [facility] Discharge Instructions  RECOMMENDATIONS MADE BY THE CONSULTANT AND ANY TEST RESULTS WILL BE SENT TO YOUR REFERRING PHYSICIAN.  Exam today and first chemotherapy Appointments as scheduled   Thank you for choosing [place] at [facility] to provide your oncology and hematology care.  To afford each patient quality time with our provider, please arrive at least 15 minutes before your scheduled appointment time.    You need to re-schedule your appointment should you arrive 10 or more minutes late.  We strive to give you quality time with our providers, and arriving late affects you and other patients whose appointments are after yours.  Also, if you no show three or more times for appointments you may be dismissed from the clinic at the providers discretion.     Again, thank you for choosing [facility]  Our hope is that these requests will decrease the amount of time that you wait before being seen by our physicians.       _____________________________________________________________  Should you have questions after your visit to [facility], please contact our office at [phone] between the hours of 8:30 a.m. and 4:30 p.m.  Voicemails left after 4:30 p.m. will not be returned until the following business day.  For prescription refill requests, have your pharmacy contact our office.

## 2015-01-04 NOTE — Progress Notes (Signed)
Tolerated chemo well. D/C home with continuous infusion pump. Patient and caregiver both educated on pump, verbalized understanding.

## 2015-01-05 ENCOUNTER — Other Ambulatory Visit (HOSPITAL_COMMUNITY): Payer: Self-pay | Admitting: Hematology & Oncology

## 2015-01-05 ENCOUNTER — Other Ambulatory Visit (HOSPITAL_COMMUNITY): Payer: Self-pay

## 2015-01-05 LAB — ERYTHROPOIETIN: ERYTHROPOIETIN: 18.5 m[IU]/mL (ref 2.6–18.5)

## 2015-01-05 LAB — CEA: CEA: 4.5 ng/mL (ref 0.0–4.7)

## 2015-01-06 ENCOUNTER — Encounter (HOSPITAL_BASED_OUTPATIENT_CLINIC_OR_DEPARTMENT_OTHER): Payer: Medicare Other

## 2015-01-06 VITALS — BP 136/85 | HR 98 | Temp 98.1°F | Resp 18

## 2015-01-06 DIAGNOSIS — R911 Solitary pulmonary nodule: Secondary | ICD-10-CM

## 2015-01-06 DIAGNOSIS — E611 Iron deficiency: Secondary | ICD-10-CM | POA: Diagnosis not present

## 2015-01-06 DIAGNOSIS — C20 Malignant neoplasm of rectum: Secondary | ICD-10-CM | POA: Diagnosis not present

## 2015-01-06 DIAGNOSIS — C7800 Secondary malignant neoplasm of unspecified lung: Secondary | ICD-10-CM | POA: Diagnosis not present

## 2015-01-06 DIAGNOSIS — Z5189 Encounter for other specified aftercare: Secondary | ICD-10-CM | POA: Diagnosis not present

## 2015-01-06 MED ORDER — PEGFILGRASTIM 6 MG/0.6ML ~~LOC~~ PSKT
6.0000 mg | PREFILLED_SYRINGE | Freq: Once | SUBCUTANEOUS | Status: AC
  Administered 2015-01-06: 6 mg via SUBCUTANEOUS

## 2015-01-06 MED ORDER — SODIUM CHLORIDE 0.9 % IJ SOLN
10.0000 mL | INTRAMUSCULAR | Status: DC | PRN
  Administered 2015-01-06: 10 mL
  Filled 2015-01-06: qty 10

## 2015-01-06 MED ORDER — CYANOCOBALAMIN 1000 MCG/ML IJ SOLN
1000.0000 ug | INTRAMUSCULAR | Status: DC
  Administered 2015-01-06: 1000 ug via INTRAMUSCULAR

## 2015-01-06 MED ORDER — HEPARIN SOD (PORK) LOCK FLUSH 100 UNIT/ML IV SOLN
INTRAVENOUS | Status: AC
  Filled 2015-01-06: qty 5

## 2015-01-06 MED ORDER — PEGFILGRASTIM 6 MG/0.6ML ~~LOC~~ PSKT
PREFILLED_SYRINGE | SUBCUTANEOUS | Status: AC
  Filled 2015-01-06: qty 0.6

## 2015-01-06 MED ORDER — HEPARIN SOD (PORK) LOCK FLUSH 100 UNIT/ML IV SOLN
500.0000 [IU] | Freq: Once | INTRAVENOUS | Status: AC | PRN
  Administered 2015-01-06: 500 [IU]

## 2015-01-06 MED ORDER — CYANOCOBALAMIN 1000 MCG/ML IJ SOLN
INTRAMUSCULAR | Status: AC
  Filled 2015-01-06: qty 1

## 2015-01-06 NOTE — Progress Notes (Signed)
Patient denies any complaints post chemo. Caregiver reports patient was restless during the night but because she has been sleeping during the day. D/C continuous infusion pump. Flushed port per protocol and de-accessed port needle. Marland KitchenKipp [name] arrived today for ONPRO neulasta on body injector. See MAR for administration details. Injector in place and engaged with green light indicator on flashing. Tolerated application with out problems. [name] presents today for injection per MD orders. B12 1000 mcg administered IM in left Upper Arm. Administration without incident. Patient tolerated well. Caregiver reports that patient blood sugars have been normal for her

## 2015-01-06 NOTE — Patient Instructions (Signed)
[place] at [facility] Discharge Instructions  RECOMMENDATIONS MADE BY THE CONSULTANT AND ANY TEST RESULTS WILL BE SENT TO YOUR REFERRING PHYSICIAN.  Neulasta OnPro body injector placed on upper arm. You may safely remove injector tomorrow (Saturday) at 5 pm. You were given Vitamin B12 1000 mcg injection today. You will come back weekly for this injection x 3 more weeks. Keep all appointments as scheduled.  Thank you for choosing [place] at [facility] to provide your oncology and hematology care.  To afford each patient quality time with our provider, please arrive at least 15 minutes before your scheduled appointment time.    You need to re-schedule your appointment should you arrive 10 or more minutes late.  We strive to give you quality time with our providers, and arriving late affects you and other patients whose appointments are after yours.  Also, if you no show three or more times for appointments you may be dismissed from the clinic at the providers discretion.     Again, thank you for choosing [facility].  Our hope is that these requests will decrease the amount of time that you wait before being seen by our physicians.       _____________________________________________________________  Should you have questions after your visit to [facility], please contact our office at [phone] between the hours of 8:30 a.m. and 4:30 p.m.  Voicemails left after 4:30 p.m. will not be returned until the following business day.  For prescription refill requests, have your pharmacy contact our office.

## 2015-01-09 ENCOUNTER — Ambulatory Visit (HOSPITAL_COMMUNITY)
Admission: RE | Admit: 2015-01-09 | Discharge: 2015-01-09 | Disposition: A | Discharge status: Home / Self Care | Payer: Medicare Other | Source: Ambulatory Visit | Attending: Hematology & Oncology | Admitting: Hematology & Oncology

## 2015-01-09 ENCOUNTER — Other Ambulatory Visit (HOSPITAL_COMMUNITY): Payer: Medicare Other

## 2015-01-09 DIAGNOSIS — Z902 Acquired absence of lung [part of]: Secondary | ICD-10-CM | POA: Insufficient documentation

## 2015-01-09 DIAGNOSIS — C7802 Secondary malignant neoplasm of left lung: Secondary | ICD-10-CM | POA: Insufficient documentation

## 2015-01-09 DIAGNOSIS — Z79899 Other long term (current) drug therapy: Secondary | ICD-10-CM | POA: Diagnosis not present

## 2015-01-09 DIAGNOSIS — C20 Malignant neoplasm of rectum: Secondary | ICD-10-CM | POA: Diagnosis present

## 2015-01-09 DIAGNOSIS — N183 Chronic kidney disease, stage 3 unspecified: Secondary | ICD-10-CM

## 2015-01-09 DIAGNOSIS — R911 Solitary pulmonary nodule: Secondary | ICD-10-CM | POA: Diagnosis not present

## 2015-01-09 DIAGNOSIS — Z933 Colostomy status: Secondary | ICD-10-CM | POA: Diagnosis not present

## 2015-01-09 DIAGNOSIS — E611 Iron deficiency: Secondary | ICD-10-CM

## 2015-01-09 LAB — SURGICAL PCR SCREEN
MRSA, PCR: POSITIVE — AB
STAPHYLOCOCCUS AUREUS: POSITIVE — AB

## 2015-01-09 MED ORDER — IOHEXOL 300 MG/ML  SOLN
100.0000 mL | Freq: Once | INTRAMUSCULAR | Status: AC | PRN
  Administered 2015-01-09: 100 mL via INTRAVENOUS

## 2015-01-09 NOTE — Assessment & Plan Note (Addendum)
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 Roxan Hockey on 10/26/2014 demonstrating recurrent Stage IV disease after undergoing Xeloda+XRT finishing on 05/04/2013, followed by APR on 07/26/2013 by Dr. Arnoldo Morale with marked cytoreduction documented, followed by 6 months worth of Xeloda 1000 mg BID 2 weeks on and 1 week off x 6 months.  Surveillance per NCCN guidelines was followed with serial CT imaging of chest demonstrating an enlarging pulmonary nodule as mentioned previously.  Oncology history is updated.  Staging is corrected for accuracy.  Today is a nadir check following her first cycle of FOLFOX therapy.  Labs today: CBC diff, CMET  She tolerated treatment well for cycle 1.  She notes that she experienced minimal nausea without vomiting.  She was given a PO antiemetic and this was effective for her.  I have recommended Neosporin or something similar +/- bandaid on open area superior to port-a-cath for healing.  Return next week for cycle 2 of therapy.  Continue with adjuvant FOLFOX.  I will add Avastin to begin on cycle 3 of FOLFOX toprovide a full 28 days post-op from port placement.

## 2015-01-09 NOTE — Progress Notes (Signed)
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 female returns for followup of Locally advanced adenocarcinoma of the rectum, She tolerated combined modality therapy well. Radiation therapy completed on 05/04/2013 which was also the last day she received Xeloda, excellent tolerance, status post APR on 07/26/2013 with marked cytoreduction documented as evidenced by no lymph node involvement. pT3 tumor was found. S/P 6 months of Xeloda at 1000 mg BID 2 weeks on and 1 week off finishing in December 2015.  Serial CT imaging demonstrated an enlarging pulmonary nodule resulting in LLL superior segmentectomy by Dr. Roxan Hockey on 10/26/2014 demonstrating oligometastatic disease of rectal adenocarcinoma, resulting in an upstage to Stage IV disease.    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   03/25/2013 - 05/05/2013 Chemotherapy Xeloda 1500 mg BID 5 days/week with radiation therapy.   07/26/2013 Definitive Surgery Dr. Arnoldo Morale- Flexible sigmoidoscopy, abdominoperineal resection, total abdominal hysterectomy with bilateral salpingo-oophorectomy   08/30/2013 - 03/01/2014 Chemotherapy Xeloda 1000 mg BID 14 days on and 7 days off x 6 months   03/08/2014 Imaging CT CAP- Bilateral pulmonary nodules, including an 8 mm left lower lobe nodule. Metastatic disease is a concern. The left lower lobe nodule has progressed since 03/05/2013, when it measured 5 mm.   05/26/2014 Imaging CT Chest- Continued enlargement of the superior segment left lower lobe nodule. Malignancy is likely.   In contrast, the  right lower lobe nodule is probably benign.   08/17/2014 Imaging CT- Superior segment left lower lobe pulmonary nodule measures stable since the most recent comparison study, but is again noted to have increased in size when comparing to older studies. Continued close attention will be required as neoplasm remains a con   10/26/2014 Surgery thoracoscopic left lower lobe superior segmentectomy. Pathology with metastatic adenocarcinoma c/w colonic primary   01/04/2015 -  Chemotherapy FOLFOX   01/09/2015 Imaging CT CAP- Interval wedge resection of metastasis within the left lower lobe. No evidence of new metastatic disease within the chest, abdomen or pelvis.    TODAY IS A NADIR CHECK FOLLOWING HE FIRST CYCLE OF SYSTEMIC CHEMOTHERAPY WITH FOLFOX.  I personally reviewed and went over laboratory results with the patient.  The results are noted within this dictation.  Labs will be updated today.  She denies any issues.  She reports minor nausea without emesis that was managed well with PO antiemetics.  She is encouraged to drink plenty of H2O.    Past Medical History  Diagnosis Date  . Hypertension   . Diabetes mellitus     years  . Asthma   . Depression   . Mental retardation     stopped school at 9th grade   . History of recurrent UTIs   . Shortness of breath     with exertion  . Cancer (HCC)     Rectal   . Arthritis   . Gout   . Seizures ([place])     more than 4 yrs since last seizure. UNknown etiology  . Rectal cancer ([place])   . Iron deficiency 04/27/2014  . Chronic renal disease, stage 3, moderately decreased glomerular filtration rate between  30-59 mL/min/1.73 square meter 04/27/2014  . Numbness and tingling in hands     x several months   . 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     has No Known Allergies.  Current Outpatient Prescriptions on File Prior to Visit  Medication Sig Dispense Refill  . amLODipine (NORVASC) 10 MG tablet Take 1 tablet (10 mg total) by mouth every morning. 30 tablet 1  . aspirin EC 81 MG tablet Take 81 mg by mouth daily.    .Marland Kitchendextrose 5 % SOLN 1,000 mL with fluorouracil 5 GM/100ML SOLN Inject into the vein. Every 14 days over 46 hours    . diphenhydrAMINE (BENADRYL) 25 MG tablet Take 1 tablet (25 mg total) by mouth every 6 (six) hours. 14 tablet 0  . docusate sodium (COLACE) 100 MG capsule Take 100 mg by mouth 2 (two) times daily.    . Emollient (CERAVE) LOTN Apply 1 application topically daily. Apply to both feet    . famotidine (PEPCID) 20 MG tablet Take 1 tablet (20 mg total) by mouth 2 (two) times daily. 14 tablet 0  . ferrous sulfate 325 (65 FE) MG tablet TAKE (1) TABLET BY MOUTH TWICE DAILY. 60 tablet 4  . fish oil-omega-3 fatty acids 1000 MG capsule Take 1 g by mouth 2 (two) times daily.    . Fluticasone-Salmeterol (ADVAIR) 250-50 MCG/DOSE AEPB Inhale 1 puff into the lungs every 12 (twelve) hours.    .Marland Kitchenglucose blood (ACCU-CHEK AVIVA PLUS) test strip 1 each by Other route daily. Use as instructed    . ibuprofen (ADVIL,MOTRIN) 600 MG tablet Take 600 mg by mouth every 6 (six) hours as needed for moderate pain.    . Insulin Glargine (LANTUS SOLOSTAR) 100 UNIT/ML SOPN Inject 12 Units into the skin at  bedtime.     .Marland Kitchenleucovorin 50 MG injection Inject into the vein. Every 14 days    . lidocaine-prilocaine (EMLA) cream Apply a quarter size amount to port site 1 hour prior to chemo. Do not rub in. Cover with plastic wrap. 30 g 3  . lisinopril (PRINIVIL,ZESTRIL) 10 MG tablet Take 1 tablet (10 mg total) by mouth daily. 30 tablet 1  . loperamide (IMODIUM) 2 MG capsule Take 2 caps after 1st loose stool and then 1 cap q2h until 12 hours have passed without having a loose stool. At bedtime, take 2 caps. Then 2 caps q4 h until morning. 45 capsule 0  . loratadine-pseudoephedrine (LORATADINE-D 24HR) 10-240 MG per 24 hr tablet Take 1 tablet by mouth daily.    . metFORMIN (GLUCOPHAGE) 1000 MG tablet Take 1,000 mg by mouth 2 (two) times daily with a meal.    . metoprolol succinate (TOPROL-XL) 50 MG 24 hr tablet Take 100 mg by mouth at bedtime.     . montelukast (SINGULAIR) 10 MG tablet Take 10 mg by mouth at bedtime.    . ondansetron (ZOFRAN) 8 MG tablet Take 1  tablet (8 mg total) by mouth every 8 (eight) hours as needed for nausea or vomiting. #1 nausea med to take 30 tablet 2  . OXALIPLATIN IV Inject into the vein every 14 (fourteen) days. Starting 10/19    . pantoprazole (PROTONIX) 40 MG tablet Take 40 mg by mouth every morning.     . Pegfilgrastim (NEULASTA ONPRO [place]) Inject into the skin. Every 14 days    . phenytoin (DILANTIN) 100 MG ER capsule Take 100 mg by mouth 2 (two) times daily.    . pravastatin (PRAVACHOL) 40 MG tablet Take 40 mg by mouth every evening.     . predniSONE (DELTASONE) 10 MG tablet Take 4 tablets (40 mg total) by mouth daily. 20 tablet 0  . PROAIR HFA 108 (90 BASE) MCG/ACT inhaler     . prochlorperazine (COMPAZINE) 10 MG tablet Take 1 tablet (10 mg total) by mouth every 6 (six) hours as needed for nausea or vomiting. #2 nausea med to take 30 tablet 2  . silver sulfADIAZINE (SILVADENE) 1 % cream Apply 1 application topically every morning.     . sodium chloride (OCEAN) 0.65 % SOLN  nasal spray Place 2 sprays into both nostrils 4 (four) times daily as needed for congestion.    . traMADol (ULTRAM) 50 MG tablet Take 50 mg by mouth every 4 (four) hours as needed for moderate pain.     [name] triamcinolone cream (KENALOG) 0.1 % Apply 1 application topically 2 (two) times daily. To stoma    . HYDROcodone-acetaminophen (NORCO/VICODIN) 5-325 MG tablet Take 1 tablet by mouth every 4 (four) hours as needed for moderate pain. For pain not relieved by tramadol (Patient not taking: Reported on 01/10/2015) 40 tablet 0   No current facility-administered medications on file prior to visit.    Past Surgical History  Procedure Laterality Date  . Abdominal hysterectomy    . Multiple extractions with alveoloplasty N/A 11/23/2012    Procedure: MULTIPLE EXTRACION 1, 2, 4, 5, 6, 7, 8, 9, 10, 11, 12, 13, 14, 17, 18, 20, 23, 24, 25, 26, 28, 29, 32 WITH ALVEOLOPLASTY, REMOVE BILATERAL TORI;  Surgeon: Gae Bon, DDS;  Location: [place];  Service: Oral Surgery;  Laterality: N/A;  . Colonoscopy with esophagogastroduodenoscopy (egd) N/A 01/14/2013    Procedure: COLONOSCOPY WITH ESOPHAGOGASTRODUODENOSCOPY (EGD);  Surgeon: Rogene Houston, MD;  [facility];  Service: Endoscopy;  Laterality: N/A;  250-moved to 315 Ann to notify pt  . Colonoscopy N/A 02/04/2013    Procedure: COLONOSCOPY;  Surgeon: Rogene Houston, MD;  [facility];  Service: Endoscopy;  Laterality: N/A;  225  . Eus N/A 02/18/2013    Procedure: LOWER ENDOSCOPIC ULTRASOUND (EUS);  Surgeon: Milus Banister, MD;  Location: [facility];  Service: Endoscopy;  Laterality: N/A;  . Flexible sigmoidoscopy N/A 07/26/2013    Procedure: FLEXIBLE SIGMOIDOSCOPY;  Surgeon: Jamesetta So, MD;  [facility];  Service: General;  Laterality: N/A;  . Abdominal perineal bowel resection N/A 07/26/2013    Procedure:  ABDOMINAL PERINEAL RESECTION;  Surgeon: Jamesetta So, MD;  [facility];  Service: General;  Laterality: N/A;  .  Supracervical abdominal hysterectomy N/A 07/26/2013    Procedure: HYSTERECTOMY SUPRACERVICAL ABDOMINAL ;  Surgeon: Jamesetta So, MD;  [facility];  Service: General;  Laterality: N/A;  . Salpingoophorectomy Bilateral 07/26/2013    Procedure: SALPINGO OOPHORECTOMY;  Surgeon: Jamesetta So, MD;  [facility];  Service: General;  Laterality: Bilateral;  .  Colostomy Left 07/26/2013    Procedure: COLOSTOMY;  Surgeon: Jamesetta So, MD;  [facility];  Service: General;  Laterality: Left;  . Colonoscopy N/A 05/12/2014    Procedure: COLONOSCOPY;  Surgeon: Rogene Houston, MD;  [facility];  Service: Endoscopy;  Laterality: N/A;  1030  . Colon surgery  2015    bowel resection/w colostomy  . Video assisted thoracoscopy Left 10/26/2014    Procedure: LEFT VIDEO ASSISTED THORACOSCOPY;  Surgeon: [name], MD;  Location: [place];  Service: Thoracic;  Laterality: Left;  . Segmentecomy Left 10/26/2014    Procedure: LEFT LOWER LOBE SUPERIOR SEGMENTECTOMY;  Surgeon: [name], MD;  Location: [place];  Service: Thoracic;  Laterality: Left;  . Lymph node dissection Left 10/26/2014    Procedure: LYMPH NODE DISSECTION;  Surgeon: [name], MD;  Location: [place];  Service: Thoracic;  Laterality: Left;  . Portacath placement Left 01/02/2015    Procedure: INSERTION PORT-A-CATH;  Surgeon: Aviva Signs, MD;  [facility];  Service: General;  Laterality: Left;    Denies any headaches, dizziness, double vision, fevers, chills, night sweats, vomiting, diarrhea, constipation, chest pain, heart palpitations, shortness of breath, blood in stool, black tarry stool, urinary pain, urinary burning, urinary frequency, hematuria.   PHYSICAL EXAMINATION  ECOG PERFORMANCE STATUS: 2 - Symptomatic, <50% confined to bed  Filed Vitals:   01/10/15 1400  BP: 156/76  Pulse: 90  Temp: 97.8 F (36.6 C)  Resp: 18    GENERAL:alert, healthy, no distress, well nourished,  comfortable, cooperative, obese, smiling and accompanied by caregiver SKIN: skin color, texture, turgor are normal, small healing open area superior to port-a-cath not infected HEAD: Normocephalic, No masses, lesions, tenderness or abnormalities EYES: normal, PERRLA, EOMI, Conjunctiva are pink and non-injected EARS: External ears normal OROPHARYNX:lips, buccal mucosa, and tongue normal and mucous membranes are moist  NECK: supple, no adenopathy, trachea midline LYMPH:  no palpable lymphadenopathy BREAST:not examined LUNGS: clear to auscultation  HEART: regular rate & rhythm ABDOMEN:abdomen soft, non-tender, obese and normal bowel sounds BACK: Back symmetric, no curvature., No CVA tenderness EXTREMITIES:less then 2 second capillary refill, no joint deformities, effusion, or inflammation, no skin discoloration, no cyanosis  NEURO: no focal motor/sensory deficits, gait normal   LABORATORY DATA: CBC    Component Value Date/Time   WBC 4.9 01/04/2015 0850   RBC 3.46* 01/04/2015 0850   RBC 3.84* 11/07/2009 1649   HGB 9.7* 01/04/2015 0850   HCT 30.3* 01/04/2015 0850   PLT 170 01/04/2015 0850   MCV 87.6 01/04/2015 0850   MCH 28.0 01/04/2015 0850   MCHC 32.0 01/04/2015 0850   RDW 13.5 01/04/2015 0850   LYMPHSABS 0.5* 01/04/2015 0850   MONOABS 0.3 01/04/2015 0850   EOSABS 0.3 01/04/2015 0850   BASOSABS 0.0 01/04/2015 0850      Chemistry      Component Value Date/Time   NA 139 01/04/2015 0850   K 4.6 01/04/2015 0850   CL 105 01/04/2015 0850   CO2 26 01/04/2015 0850   BUN 42* 01/04/2015 0850   CREATININE 1.17* 01/04/2015 0850   CREATININE 1.11* 02/08/2013 1451      Component Value Date/Time   CALCIUM 8.3* 01/04/2015 0850   ALKPHOS 222* 01/04/2015 0850   AST 21 01/04/2015 0850   ALT 17 01/04/2015 0850   BILITOT 0.3 01/04/2015 0850      Lab Results  Component Value Date   CEA 4.5 01/04/2015     PENDING LABS:  RADIOGRAPHIC STUDIES:  Ct Chest W  Contrast  01/09/2015  CLINICAL DATA:  Stage IV colorectal cancer. Chemotherapy ongoing. Subsequent encounter. EXAM: CT CHEST, ABDOMEN, AND PELVIS WITH CONTRAST TECHNIQUE: Multidetector CT imaging of the chest, abdomen and pelvis was performed following the standard protocol during bolus administration of intravenous contrast. CONTRAST:  15m OMNIPAQUE IOHEXOL 300 MG/ML  SOLN COMPARISON:  CTs 03/07/2014.  PET-CT 10/07/2014. FINDINGS: CT CHEST FINDINGS Mediastinum/Nodes: There are no enlarged mediastinal, hilar or axillary lymph nodes. Small axillary lymph nodes bilaterally are stable. The heart size is normal. There is no pericardial effusion. Left subclavian Port-A-Cath tip in the left brachiocephalic vein. No significant vascular findings. Lungs/Pleura: There is no pleural effusion.Interval wedge resection of metastasis in the superior segment of the left lower lobe. For no new nodules demonstrated. There are low lung volumes and mild perihilar atelectasis bilaterally. Musculoskeletal/Chest wall: No chest wall mass or lytic lesion identified. Heterogeneous sclerosis throughout the spine is again noted associated with changes of diffuse idiopathic skeletal hyperostosis. These marrow changes are chronic, and given the associated findings in the bony pelvis may be related to Paget's disease. CT ABDOMEN AND PELVIS FINDINGS Hepatobiliary: The hepatic density is diffusely decreased. No focal hepatic lesions are identified. No evidence of gallstones, gallbladder wall thickening or biliary dilatation. Pancreas: Atrophy without focal abnormality or surrounding inflammation. Spleen: Normal in size without focal abnormality. Adrenals/Urinary Tract: The adrenal glands appear stable with low-density prominence of the right gland, consistent with an adenoma. The right kidney appears stable without significant findings. Advanced cortical scarring and atrophy on the left are again noted. No evidence of renal mass or  hydronephrosis. Possible postsurgical changes in the left renal pelvis, stable. The bladder appears unremarkable. Stomach/Bowel: No evidence of bowel wall thickening, distention or surrounding inflammatory change. Descending colostomy noted with increased parastomal herniation of omental fat and colon. No evidence of incarceration. The appendix appears normal. Vascular/Lymphatic: There are no enlarged abdominal or pelvic lymph nodes. No significant vascular findings are present. Reproductive: Hysterectomy. Prominent soft tissue anterior to the sacrum is unchanged, probably due to a combination of cervix and presacral fibrosis from previous APR. This was not hypermetabolic on PET-CT. Other: None. Musculoskeletal: No acute or significant osseous findings. 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 IMPRESSION: 1. Interval wedge resection of metastasis within the left lower lobe. 2. No evidence of new metastatic disease within the chest, abdomen or pelvis. 3. Slightly increased parastomal herniation of omental fat and colon status post APR. Presacral soft tissue appears unchanged. 4. Port-A-Cath tip in the left brachiocephalic vein. Electronically Signed   By: WRichardean SaleM.D.   On: 01/09/2015 16:29   Ct Abdomen Pelvis W Contrast  01/09/2015  CLINICAL DATA:  Stage IV colorectal cancer. Chemotherapy ongoing. Subsequent encounter. EXAM: CT CHEST, ABDOMEN, AND PELVIS WITH CONTRAST TECHNIQUE: Multidetector CT imaging of the chest, abdomen and pelvis was performed following the standard protocol during bolus administration of intravenous contrast. CONTRAST:  1066mOMNIPAQUE IOHEXOL 300 MG/ML  SOLN COMPARISON:  CTs 03/07/2014.  PET-CT 10/07/2014. FINDINGS: CT CHEST FINDINGS Mediastinum/Nodes: There are no enlarged mediastinal, hilar or axillary lymph nodes. Small axillary lymph nodes bilaterally are stable. The heart size is normal. There is  no pericardial effusion. Left subclavian Port-A-Cath tip in the left brachiocephalic vein. No significant vascular findings. Lungs/Pleura: There is no pleural effusion.Interval wedge resection of metastasis in the superior segment of the left lower lobe. For no new nodules demonstrated. There  are low lung volumes and mild perihilar atelectasis bilaterally. Musculoskeletal/Chest wall: No chest wall mass or lytic lesion identified. Heterogeneous sclerosis throughout the spine is again noted associated with changes of diffuse idiopathic skeletal hyperostosis. These marrow changes are chronic, and given the associated findings in the bony pelvis may be related to Paget's disease. CT ABDOMEN AND PELVIS FINDINGS Hepatobiliary: The hepatic density is diffusely decreased. No focal hepatic lesions are identified. No evidence of gallstones, gallbladder wall thickening or biliary dilatation. Pancreas: Atrophy without focal abnormality or surrounding inflammation. Spleen: Normal in size without focal abnormality. Adrenals/Urinary Tract: The adrenal glands appear stable with low-density prominence of the right gland, consistent with an adenoma. The right kidney appears stable without significant findings. Advanced cortical scarring and atrophy on the left are again noted. No evidence of renal mass or hydronephrosis. Possible postsurgical changes in the left renal pelvis, stable. The bladder appears unremarkable. Stomach/Bowel: No evidence of bowel wall thickening, distention or surrounding inflammatory change. Descending colostomy noted with increased parastomal herniation of omental fat and colon. No evidence of incarceration. The appendix appears normal. Vascular/Lymphatic: There are no enlarged abdominal or pelvic lymph nodes. No significant vascular findings are present. Reproductive: Hysterectomy. Prominent soft tissue anterior to the sacrum is unchanged, probably due to a combination of cervix and presacral fibrosis from  previous APR. This was not hypermetabolic on PET-CT. Other: None. Musculoskeletal: No acute or significant osseous findings. 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 IMPRESSION: 1. Interval wedge resection of metastasis within the left lower lobe. 2. No evidence of new metastatic disease within the chest, abdomen or pelvis. 3. Slightly increased parastomal herniation of omental fat and colon status post APR. Presacral soft tissue appears unchanged. 4. Port-A-Cath tip in the left brachiocephalic vein. Electronically Signed   By: Richardean Sale M.D.   On: 01/09/2015 16:29   Dg Chest Port 1 View  01/02/2015  CLINICAL DATA:  Left-sided Port-A-Cath placement EXAM: PORTABLE CHEST 1 VIEW COMPARISON:  11/15/2014 FINDINGS: Left chest wall port a catheter is noted. The tip of the catheter is in the expected location of the proximal aspect of the left brachiocephalic vein. Heart size is normal. No pleural effusion or pneumothorax. There is asymmetric elevation of right hemidiaphragm noted. No airspace consolidation. IMPRESSION: 1. No pneumothorax after porta cath placement. Tip of the catheter is in the expected location of the proximal aspect of the left brachiocephalic vein. Electronically Signed   By: Kerby Moors M.D.   On: 01/02/2015 10:49   Dg C-arm 1-60 Min-no Report  01/02/2015  CLINICAL DATA: Portacath insertion C-ARM 1-60 MINUTES Fluoroscopy was utilized by the requesting physician.  No radiographic interpretation.     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 Roxan Hockey on 10/26/2014 demonstrating recurrent Stage IV disease after undergoing Xeloda+XRT finishing on 05/04/2013, followed by APR on 07/26/2013 by Dr. Arnoldo Morale with marked cytoreduction documented, followed by 6 months  worth of Xeloda 1000 mg BID 2 weeks on and 1 week off x 6 months.  Surveillance per NCCN guidelines was followed with serial CT imaging of chest demonstrating an enlarging pulmonary nodule as mentioned previously.  Oncology history is updated.  Staging is corrected for accuracy.  Today is a nadir check following her first cycle of FOLFOX therapy.  Labs today: CBC diff, CMET  She tolerated treatment well  for cycle 1.  She notes that she experienced minimal nausea without vomiting.  She was given a PO antiemetic and this was effective for her.  I have recommended Neosporin or something similar +/- bandaid on open area superior to port-a-cath for healing.  Return next week for cycle 2 of therapy.  Continue with adjuvant FOLFOX.  I will add Avastin to begin on cycle 3 of FOLFOX toprovide a full 28 days post-op from port placement.    THERAPY PLAN:  Continue with adjuvant FOLFOX.  I will add Avastin to begin on cycle 3 of FOLFOX toprovide a full 28 days post-op from port placement.  All questions were answered. The patient knows to call the clinic with any problems, questions or concerns. We can certainly see the patient much sooner if necessary.  Patient and plan discussed with Dr. Ancil Linsey and she is in agreement with the aforementioned.   This note is electronically signed by: Doy Mince 01/10/2015 3:24 PM

## 2015-01-10 ENCOUNTER — Encounter (HOSPITAL_COMMUNITY): Payer: Self-pay | Admitting: Oncology

## 2015-01-10 ENCOUNTER — Encounter (HOSPITAL_COMMUNITY): Payer: Medicare Other

## 2015-01-10 ENCOUNTER — Ambulatory Visit (HOSPITAL_COMMUNITY): Payer: Medicare Other | Admitting: Oncology

## 2015-01-10 ENCOUNTER — Encounter (HOSPITAL_BASED_OUTPATIENT_CLINIC_OR_DEPARTMENT_OTHER): Payer: Medicare Other | Admitting: Oncology

## 2015-01-10 VITALS — BP 156/76 | HR 90 | Temp 97.8°F | Resp 18 | Wt 231.0 lb

## 2015-01-10 DIAGNOSIS — C7800 Secondary malignant neoplasm of unspecified lung: Secondary | ICD-10-CM

## 2015-01-10 DIAGNOSIS — C20 Malignant neoplasm of rectum: Secondary | ICD-10-CM | POA: Diagnosis not present

## 2015-01-10 DIAGNOSIS — R110 Nausea: Secondary | ICD-10-CM | POA: Diagnosis not present

## 2015-01-10 LAB — COMPREHENSIVE METABOLIC PANEL
ALT: 17 U/L (ref 14–54)
AST: 16 U/L (ref 15–41)
Albumin: 3.7 g/dL (ref 3.5–5.0)
Alkaline Phosphatase: 269 U/L — ABNORMAL HIGH (ref 38–126)
Anion gap: 7 (ref 5–15)
BUN: 29 mg/dL — ABNORMAL HIGH (ref 6–20)
CO2: 27 mmol/L (ref 22–32)
Calcium: 8.2 mg/dL — ABNORMAL LOW (ref 8.9–10.3)
Chloride: 101 mmol/L (ref 101–111)
Creatinine, Ser: 1.36 mg/dL — ABNORMAL HIGH (ref 0.44–1.00)
GFR calc Af Amer: 47 mL/min — ABNORMAL LOW (ref >60)
GFR calc non Af Amer: 40 mL/min — ABNORMAL LOW (ref >60)
Glucose, Bld: 101 mg/dL — ABNORMAL HIGH (ref 65–99)
Potassium: 4.9 mmol/L (ref 3.5–5.1)
Sodium: 135 mmol/L (ref 135–145)
Total Bilirubin: 0.3 mg/dL (ref 0.3–1.2)
Total Protein: 6.9 g/dL (ref 6.5–8.1)

## 2015-01-10 LAB — CBC WITH DIFFERENTIAL/PLATELET
BASOS PCT: 0 %
Basophils Absolute: 0 10*3/uL (ref 0.0–0.1)
EOS ABS: 0.5 10*3/uL (ref 0.0–0.7)
EOS PCT: 2 %
HCT: 29.8 % — ABNORMAL LOW (ref 36.0–46.0)
Hemoglobin: 9.5 g/dL — ABNORMAL LOW (ref 12.0–15.0)
LYMPHS ABS: 1 10*3/uL (ref 0.7–4.0)
Lymphocytes Relative: 3 %
MCH: 27.9 pg (ref 26.0–34.0)
MCHC: 31.9 g/dL (ref 30.0–36.0)
MCV: 87.6 fL (ref 78.0–100.0)
Monocytes Absolute: 1.9 10*3/uL — ABNORMAL HIGH (ref 0.1–1.0)
Monocytes Relative: 6 %
Neutro Abs: 28.2 10*3/uL — ABNORMAL HIGH (ref 1.7–7.7)
Neutrophils Relative %: 89 %
PLATELETS: 162 10*3/uL (ref 150–400)
RBC: 3.4 MIL/uL — AB (ref 3.87–5.11)
RDW: 13.2 % (ref 11.5–15.5)
WBC: 31.6 10*3/uL — AB (ref 4.0–10.5)

## 2015-01-10 MED ORDER — CYANOCOBALAMIN 1000 MCG/ML IJ SOLN
INTRAMUSCULAR | Status: AC
  Filled 2015-01-10: qty 1

## 2015-01-10 MED ORDER — CYANOCOBALAMIN 1000 MCG/ML IJ SOLN
1000.0000 ug | INTRAMUSCULAR | Status: DC
  Administered 2015-01-10: 1000 ug via INTRAMUSCULAR

## 2015-01-10 NOTE — Patient Instructions (Signed)
[place] at [facility] Discharge Instructions  RECOMMENDATIONS MADE BY THE CONSULTANT AND ANY TEST RESULTS WILL BE SENT TO YOUR REFERRING PHYSICIAN.  Exam completed by Kirby Crigler PA B12 today Lab work today Return as scheduled Please call the clinic if you have any questions or concerns  Thank you for choosing [place] at [facility] to provide your oncology and hematology care.  To afford each patient quality time with our provider, please arrive at least 15 minutes before your scheduled appointment time.    You need to re-schedule your appointment should you arrive 10 or more minutes late.  We strive to give you quality time with our providers, and arriving late affects you and other patients whose appointments are after yours.  Also, if you no show three or more times for appointments you may be dismissed from the clinic at the providers discretion.     Again, thank you for choosing [facility]  Our hope is that these requests will decrease the amount of time that you wait before being seen by our physicians.       _____________________________________________________________  Should you have questions after your visit to [facility], please contact our office at [phone] between the hours of 8:30 a.m. and 4:30 p.m.  Voicemails left after 4:30 p.m. will not be returned until the following business day.  For prescription refill requests, have your pharmacy contact our office.

## 2015-01-10 NOTE — Progress Notes (Signed)
[name]'s reason for visit today is for an injection as scheduled per MD orders. [name] also received vitamin b12 per MD orders; see [facility] for administration details.  [name] tolerated all procedures well and without incident; questions were answered and patient was discharged.

## 2015-01-10 NOTE — Progress Notes (Signed)
See office visit encounter. 

## 2015-01-17 NOTE — Assessment & Plan Note (Addendum)
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 Roxan Hockey on 10/26/2014 demonstrating recurrent Stage IV disease after undergoing Xeloda+XRT finishing on 05/04/2013, followed by APR on 07/26/2013 by Dr. Arnoldo Morale with marked cytoreduction documented, followed by 6 months worth of Xeloda 1000 mg BID 2 weeks on and 1 week off x 6 months.  Surveillance per [facility] guidelines was followed with serial CT imaging of chest demonstrating an enlarging pulmonary nodule as mentioned previously.  Oncology history is up-to-date.  She tolerated treatment well for cycle 1.  She denies any peripheral neuropathy symptoms.  She notes minimal nausea that was aborted with home anti-emetics.  No emeses.  Return in 2 weeks for follow-up.  Continue with adjuvant FOLFOX.  I will add Avastin to begin on cycle 3 of FOLFOX to provide a full 28 days post-op from port placement.

## 2015-01-17 NOTE — Progress Notes (Signed)
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  Avastin will be added for cycle #3 to allow a full 4 weeks post port placement.  INTERVAL HISTORY: Janney Priego 64 y.o. female returns for followup of Locally advanced adenocarcinoma of the rectum, She tolerated combined modality therapy well. Radiation therapy completed on 05/04/2013 which was also the last day she received Xeloda, excellent tolerance, status post APR on 07/26/2013 with marked cytoreduction documented as evidenced by no lymph node involvement. pT3 tumor was found. S/P 6 months of Xeloda at 1000 mg BID 2 weeks on and 1 week off finishing in December 2015.  Serial CT imaging demonstrated an enlarging pulmonary nodule resulting in LLL superior segmentectomy by Dr. Roxan Hockey on 10/26/2014 demonstrating oligometastatic disease of rectal adenocarcinoma, resulting in an upstage to Stage IV disease.    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   03/25/2013 - 05/05/2013 Chemotherapy Xeloda 1500 mg BID 5 days/week with radiation therapy.   07/26/2013 Definitive Surgery Dr. Arnoldo Morale- Flexible sigmoidoscopy, abdominoperineal resection, total abdominal hysterectomy with bilateral salpingo-oophorectomy   08/30/2013 - 03/01/2014 Chemotherapy Xeloda 1000 mg BID 14 days on and 7 days off x 6 months   03/08/2014 Imaging CT CAP- Bilateral pulmonary nodules, including an 8 mm left lower lobe nodule. Metastatic disease is a concern. The left lower lobe nodule has progressed since 03/05/2013, when it measured 5 mm.   05/26/2014 Imaging CT Chest- Continued enlargement of the superior segment left lower lobe nodule. Malignancy is likely.   In contrast, the right lower lobe  nodule is probably benign.   08/17/2014 Imaging CT- Superior segment left lower lobe pulmonary nodule measures stable since the most recent comparison study, but is again noted to have increased in size when comparing to older studies. Continued close attention will be required as neoplasm remains a con   10/26/2014 Surgery thoracoscopic left lower lobe superior segmentectomy. Pathology with metastatic adenocarcinoma c/w colonic primary   01/04/2015 -  Chemotherapy FOLFOX   01/09/2015 Imaging CT CAP- Interval wedge resection of metastasis within the left lower lobe. No evidence of new metastatic disease within the chest, abdomen or pelvis.    I personally reviewed and went over laboratory results with the patient.  The results are noted within this dictation.  Labs will be updated today.  Treatment parameters are met for treatment today.  She does not complain of any peripheral neuropathy symptoms.  She denies any issues with cold intolerance as well.  She does not minimal nausea that is aborted with home anti-emetics.  She is advised to take a nausea medication at the first sign of nausea.  She denies any bowel or bladder issues as well.  Past Medical History  Diagnosis Date  . Hypertension   . Diabetes mellitus     years  . Asthma   . Depression   . Mental retardation     stopped school at 9th grade   . History of recurrent UTIs   . Shortness of breath     with exertion  . Cancer (HCC)     Rectal   . Arthritis   . Gout   . Seizures ([place])     more than 4 yrs since last  seizure. UNknown etiology  . Rectal cancer ([place])   . Iron deficiency 04/27/2014  . Chronic renal disease, stage 3, moderately decreased glomerular filtration rate between 30-59 mL/min/1.73 square meter 04/27/2014  . Numbness and tingling in hands     x several months   . 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     has No Known Allergies.  Current Outpatient Prescriptions on File Prior to Visit  Medication Sig Dispense Refill  . amLODipine (NORVASC) 10 MG tablet Take 1 tablet (10 mg total) by mouth every morning. 30 tablet 1  . aspirin EC 81 MG tablet Take 81 mg by mouth daily.    .Marland Kitchendextrose 5 % SOLN 1,000 mL with fluorouracil 5 GM/100ML SOLN Inject into the vein. Every 14 days over 46 hours    . diphenhydrAMINE (BENADRYL) 25 MG tablet Take 1 tablet (25 mg total) by mouth every 6 (six) hours. 14 tablet 0  . docusate sodium (COLACE) 100 MG capsule Take 100 mg by mouth 2 (two) times daily.    . Emollient (CERAVE) LOTN Apply 1 application topically daily. Apply to both feet    . famotidine (PEPCID) 20 MG tablet Take 1 tablet (20 mg total) by mouth 2 (two) times daily. 14 tablet 0  . ferrous sulfate 325 (65 FE) MG tablet TAKE (1) TABLET BY MOUTH TWICE DAILY. 60 tablet 4  . fish oil-omega-3 fatty acids 1000 MG capsule Take 1 g by mouth 2 (two) times daily.    . Fluticasone-Salmeterol (ADVAIR) 250-50 MCG/DOSE AEPB Inhale 1 puff into the lungs every 12 (twelve) hours.    .Marland Kitchenglucose blood (ACCU-CHEK AVIVA PLUS) test strip 1 each by Other route daily. Use as instructed    . HYDROcodone-acetaminophen (NORCO/VICODIN) 5-325 MG tablet Take 1 tablet by mouth every 4 (four) hours as needed  for moderate pain. For pain not relieved by tramadol (Patient not taking: Reported on 01/10/2015) 40 tablet 0  . ibuprofen (ADVIL,MOTRIN) 600 MG tablet Take 600 mg by mouth every 6 (six) hours as needed for moderate pain.    . Insulin Glargine (LANTUS SOLOSTAR) 100 UNIT/ML SOPN Inject 12 Units into the skin at bedtime.     .Marland Kitchenleucovorin 50 MG injection Inject into the vein. Every 14 days    . lidocaine-prilocaine (EMLA) cream Apply a quarter size amount to port site 1 hour prior to chemo. Do not rub in. Cover with plastic wrap. 30 g 3  . lisinopril (PRINIVIL,ZESTRIL) 10 MG tablet Take 1 tablet (10 mg total) by mouth daily. 30 tablet 1  . loperamide (IMODIUM) 2 MG capsule Take 2 caps after 1st loose stool and then 1 cap q2h until 12 hours have passed without having a loose stool. At bedtime, take 2 caps. Then 2 caps q4 h until morning. 45 capsule 0  . loratadine-pseudoephedrine (LORATADINE-D 24HR) 10-240 MG per 24 hr tablet Take 1 tablet by mouth daily.    . metFORMIN (  GLUCOPHAGE) 1000 MG tablet Take 1,000 mg by mouth 2 (two) times daily with a meal.    . metoprolol succinate (TOPROL-XL) 50 MG 24 hr tablet Take 100 mg by mouth at bedtime.     . montelukast (SINGULAIR) 10 MG tablet Take 10 mg by mouth at bedtime.    . ondansetron (ZOFRAN) 8 MG tablet Take 1 tablet (8 mg total) by mouth every 8 (eight) hours as needed for nausea or vomiting. #1 nausea med to take 30 tablet 2  . OXALIPLATIN IV Inject into the vein every 14 (fourteen) days. Starting 10/19    . pantoprazole (PROTONIX) 40 MG tablet Take 40 mg by mouth every morning.     . Pegfilgrastim (NEULASTA ONPRO [place]) Inject into the skin. Every 14 days    . phenytoin (DILANTIN) 100 MG ER capsule Take 100 mg by mouth 2 (two) times daily.    . pravastatin (PRAVACHOL) 40 MG tablet Take 40 mg by mouth every evening.     . predniSONE (DELTASONE) 10 MG tablet Take 4 tablets (40 mg total) by mouth daily. 20 tablet 0  . PROAIR HFA 108 (90 BASE) MCG/ACT  inhaler     . prochlorperazine (COMPAZINE) 10 MG tablet Take 1 tablet (10 mg total) by mouth every 6 (six) hours as needed for nausea or vomiting. #2 nausea med to take 30 tablet 2  . silver sulfADIAZINE (SILVADENE) 1 % cream Apply 1 application topically every morning.     . sodium chloride (OCEAN) 0.65 % SOLN nasal spray Place 2 sprays into both nostrils 4 (four) times daily as needed for congestion.    . traMADol (ULTRAM) 50 MG tablet Take 50 mg by mouth every 4 (four) hours as needed for moderate pain.     [name] triamcinolone cream (KENALOG) 0.1 % Apply 1 application topically 2 (two) times daily. To stoma     Current Facility-Administered Medications on File Prior to Visit  Medication Dose Route Frequency Provider Last Rate Last Dose  . 0.9 %  sodium chloride infusion   Intravenous Once Patrici Ranks, MD      . fluorouracil (ADRUCIL) 5,200 mg in sodium chloride 0.9 % 146 mL chemo infusion  2,400 mg/m2 (Treatment Plan Actual) Intravenous 1 day or 1 dose Patrici Ranks, MD      . fluorouracil (ADRUCIL) chemo injection 850 mg  400 mg/m2 (Treatment Plan Actual) Intravenous Once Patrici Ranks, MD      . leucovorin 868 mg in dextrose 5 % 250 mL infusion  400 mg/m2 (Treatment Plan Actual) Intravenous Once Patrici Ranks, MD 147 mL/hr at 01/18/15 1220 868 mg at 01/18/15 1220  . oxaliplatin (ELOXATIN) 185 mg in dextrose 5 % 500 mL chemo infusion  85 mg/m2 (Treatment Plan Actual) Intravenous Once Patrici Ranks, MD 269 mL/hr at 01/18/15 1220 185 mg at 01/18/15 1220  . sodium chloride 0.9 % injection 10 mL  10 mL Intracatheter PRN Patrici Ranks, MD        Past Surgical History  Procedure Laterality Date  . Abdominal hysterectomy    . Multiple extractions with alveoloplasty N/A 11/23/2012    Procedure: MULTIPLE EXTRACION 1, 2, 4, 5, 6, 7, 8, 9, 10, 11, 12, 13, 14, 17, 18, 20, 23, 24, 25, 26, 28, 29, 32 WITH ALVEOLOPLASTY, REMOVE BILATERAL TORI;  Surgeon: Gae Bon, DDS;  Location:  [place];  Service: Oral Surgery;  Laterality: N/A;  . Colonoscopy with esophagogastroduodenoscopy (egd) N/A 01/14/2013  Procedure: COLONOSCOPY WITH ESOPHAGOGASTRODUODENOSCOPY (EGD);  Surgeon: Rogene Houston, MD;  [facility];  Service: Endoscopy;  Laterality: N/A;  250-moved to 315 Ann to notify pt  . Colonoscopy N/A 02/04/2013    Procedure: COLONOSCOPY;  Surgeon: Rogene Houston, MD;  [facility];  Service: Endoscopy;  Laterality: N/A;  225  . Eus N/A 02/18/2013    Procedure: LOWER ENDOSCOPIC ULTRASOUND (EUS);  Surgeon: Milus Banister, MD;  Location: [facility];  Service: Endoscopy;  Laterality: N/A;  . Flexible sigmoidoscopy N/A 07/26/2013    Procedure: FLEXIBLE SIGMOIDOSCOPY;  Surgeon: Jamesetta So, MD;  Location: [facility];  Service: General;  Laterality: N/A;  . Abdominal perineal bowel resection N/A 07/26/2013    Procedure:  ABDOMINAL PERINEAL RESECTION;  Surgeon: Jamesetta So, MD;  Location[facility];  Service: General;  Laterality: N/A;  . Supracervical abdominal hysterectomy N/A 07/26/2013    Procedure: HYSTERECTOMY SUPRACERVICAL ABDOMINAL ;  Surgeon: Jamesetta So, MD;  Location: [facility];  Service: General;  Laterality: N/A;  . Salpingoophorectomy Bilateral 07/26/2013    Procedure: SALPINGO OOPHORECTOMY;  Surgeon: Jamesetta So, MD;  Location[facility];  Service: General;  Laterality: Bilateral;  . Colostomy Left 07/26/2013    Procedure: COLOSTOMY;  Surgeon: Jamesetta So, MD;  [facility];  Service: General;  Laterality: Left;  . Colonoscopy N/A 05/12/2014    Procedure: COLONOSCOPY;  Surgeon: Rogene Houston, MD;  [facility];  Service: Endoscopy;  Laterality: N/A;  1030  . Colon surgery  2015    bowel resection/w colostomy  . Video assisted thoracoscopy Left 10/26/2014    Procedure: LEFT VIDEO ASSISTED THORACOSCOPY;  Surgeon: [name], MD;  Location: [place];  Service: Thoracic;  Laterality: Left;  . Segmentecomy Left 10/26/2014     Procedure: LEFT LOWER LOBE SUPERIOR SEGMENTECTOMY;  Surgeon: [name], MD;  Location: [place];  Service: Thoracic;  Laterality: Left;  . Lymph node dissection Left 10/26/2014    Procedure: LYMPH NODE DISSECTION;  Surgeon: [name], MD;  Location: [place];  Service: Thoracic;  Laterality: Left;  . Portacath placement Left 01/02/2015    Procedure: INSERTION PORT-A-CATH;  Surgeon: Aviva Signs, MD;  Location[facility];  Service: General;  Laterality: Left;    Denies any headaches, dizziness, double vision, fevers, chills, night sweats, vomiting, diarrhea, constipation, chest pain, heart palpitations, shortness of breath, blood in stool, black tarry stool, urinary pain, urinary burning, urinary frequency, hematuria.   PHYSICAL EXAMINATION  ECOG PERFORMANCE STATUS: 2 - Symptomatic, <50% confined to bed  There were no vitals filed for this visit.  GENERAL:alert, healthy, no distress, well nourished, comfortable, cooperative, obese, smiling and accompanied by caregiver SKIN: skin color, texture, turgor are normal. HEAD: Normocephalic, No masses, lesions, tenderness or abnormalities EYES: normal, PERRLA, EOMI, Conjunctiva are pink and non-injected EARS: External ears normal OROPHARYNX:lips, buccal mucosa, and tongue normal and mucous membranes are moist  NECK: supple, no adenopathy, trachea midline LYMPH:  no palpable lymphadenopathy BREAST:not examined LUNGS: clear to auscultation  HEART: regular rate & rhythm ABDOMEN:abdomen soft, non-tender, obese and normal bowel sounds BACK: Back symmetric, no curvature., No CVA tenderness EXTREMITIES:less then 2 second capillary refill, no joint deformities, effusion, or inflammation, no skin discoloration, no cyanosis  NEURO: no focal motor/sensory deficits, gait normal   LABORATORY DATA: CBC    Component Value Date/Time   WBC 18.5* 01/18/2015 1005   RBC 3.53* 01/18/2015 1005   RBC 3.84* 11/07/2009 1649  HGB 9.7* 01/18/2015  1005   HCT 31.0* 01/18/2015 1005   PLT 122* 01/18/2015 1005   MCV 87.8 01/18/2015 1005   MCH 27.5 01/18/2015 1005   MCHC 31.3 01/18/2015 1005   RDW 13.6 01/18/2015 1005   LYMPHSABS 0.9 01/18/2015 1005   MONOABS 0.4 01/18/2015 1005   EOSABS 0.2 01/18/2015 1005   BASOSABS 0.0 01/18/2015 1005      Chemistry      Component Value Date/Time   NA 137 01/18/2015 1005   K 5.2* 01/18/2015 1005   CL 103 01/18/2015 1005   CO2 26 01/18/2015 1005   BUN 32* 01/18/2015 1005   CREATININE 1.08* 01/18/2015 1005   CREATININE 1.11* 02/08/2013 1451      Component Value Date/Time   CALCIUM 8.0* 01/18/2015 1005   ALKPHOS 244* 01/18/2015 1005   AST 23 01/18/2015 1005   ALT 21 01/18/2015 1005   BILITOT 0.4 01/18/2015 1005      Lab Results  Component Value Date   CEA 4.5 01/04/2015     PENDING LABS:   RADIOGRAPHIC STUDIES:  Ct Chest W Contrast  01/09/2015  CLINICAL DATA:  Stage IV colorectal cancer. Chemotherapy ongoing. Subsequent encounter. EXAM: CT CHEST, ABDOMEN, AND PELVIS WITH CONTRAST TECHNIQUE: Multidetector CT imaging of the chest, abdomen and pelvis was performed following the standard protocol during bolus administration of intravenous contrast. CONTRAST:  189m OMNIPAQUE IOHEXOL 300 MG/ML  SOLN COMPARISON:  CTs 03/07/2014.  PET-CT 10/07/2014. FINDINGS: CT CHEST FINDINGS Mediastinum/Nodes: There are no enlarged mediastinal, hilar or axillary lymph nodes. Small axillary lymph nodes bilaterally are stable. The heart size is normal. There is no pericardial effusion. Left subclavian Port-A-Cath tip in the left brachiocephalic vein. No significant vascular findings. Lungs/Pleura: There is no pleural effusion.Interval wedge resection of metastasis in the superior segment of the left lower lobe. For no new nodules demonstrated. There are low lung volumes and mild perihilar atelectasis bilaterally. Musculoskeletal/Chest wall: No chest wall mass or lytic lesion identified. Heterogeneous  sclerosis throughout the spine is again noted associated with changes of diffuse idiopathic skeletal hyperostosis. These marrow changes are chronic, and given the associated findings in the bony pelvis may be related to Paget's disease. CT ABDOMEN AND PELVIS FINDINGS Hepatobiliary: The hepatic density is diffusely decreased. No focal hepatic lesions are identified. No evidence of gallstones, gallbladder wall thickening or biliary dilatation. Pancreas: Atrophy without focal abnormality or surrounding inflammation. Spleen: Normal in size without focal abnormality. Adrenals/Urinary Tract: The adrenal glands appear stable with low-density prominence of the right gland, consistent with an adenoma. The right kidney appears stable without significant findings. Advanced cortical scarring and atrophy on the left are again noted. No evidence of renal mass or hydronephrosis. Possible postsurgical changes in the left renal pelvis, stable. The bladder appears unremarkable. Stomach/Bowel: No evidence of bowel wall thickening, distention or surrounding inflammatory change. Descending colostomy noted with increased parastomal herniation of omental fat and colon. No evidence of incarceration. The appendix appears normal. Vascular/Lymphatic: There are no enlarged abdominal or pelvic lymph nodes. No significant vascular findings are present. Reproductive: Hysterectomy. Prominent soft tissue anterior to the sacrum is unchanged, probably due to a combination of cervix and presacral fibrosis from previous APR. This was not hypermetabolic on PET-CT. Other: None. Musculoskeletal: No acute or significant osseous findings. 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 IMPRESSION: 1. Interval wedge resection of metastasis within the left lower lobe. 2. No evidence of new metastatic disease within the  chest, abdomen or pelvis. 3. Slightly increased parastomal herniation  of omental fat and colon status post APR. Presacral soft tissue appears unchanged. 4. Port-A-Cath tip in the left brachiocephalic vein. Electronically Signed   By: Richardean Sale M.D.   On: 01/09/2015 16:29   Ct Abdomen Pelvis W Contrast  01/09/2015  CLINICAL DATA:  Stage IV colorectal cancer. Chemotherapy ongoing. Subsequent encounter. EXAM: CT CHEST, ABDOMEN, AND PELVIS WITH CONTRAST TECHNIQUE: Multidetector CT imaging of the chest, abdomen and pelvis was performed following the standard protocol during bolus administration of intravenous contrast. CONTRAST:  164m OMNIPAQUE IOHEXOL 300 MG/ML  SOLN COMPARISON:  CTs 03/07/2014.  PET-CT 10/07/2014. FINDINGS: CT CHEST FINDINGS Mediastinum/Nodes: There are no enlarged mediastinal, hilar or axillary lymph nodes. Small axillary lymph nodes bilaterally are stable. The heart size is normal. There is no pericardial effusion. Left subclavian Port-A-Cath tip in the left brachiocephalic vein. No significant vascular findings. Lungs/Pleura: There is no pleural effusion.Interval wedge resection of metastasis in the superior segment of the left lower lobe. For no new nodules demonstrated. There are low lung volumes and mild perihilar atelectasis bilaterally. Musculoskeletal/Chest wall: No chest wall mass or lytic lesion identified. Heterogeneous sclerosis throughout the spine is again noted associated with changes of diffuse idiopathic skeletal hyperostosis. These marrow changes are chronic, and given the associated findings in the bony pelvis may be related to Paget's disease. CT ABDOMEN AND PELVIS FINDINGS Hepatobiliary: The hepatic density is diffusely decreased. No focal hepatic lesions are identified. No evidence of gallstones, gallbladder wall thickening or biliary dilatation. Pancreas: Atrophy without focal abnormality or surrounding inflammation. Spleen: Normal in size without focal abnormality. Adrenals/Urinary Tract: The adrenal glands appear stable with  low-density prominence of the right gland, consistent with an adenoma. The right kidney appears stable without significant findings. Advanced cortical scarring and atrophy on the left are again noted. No evidence of renal mass or hydronephrosis. Possible postsurgical changes in the left renal pelvis, stable. The bladder appears unremarkable. Stomach/Bowel: No evidence of bowel wall thickening, distention or surrounding inflammatory change. Descending colostomy noted with increased parastomal herniation of omental fat and colon. No evidence of incarceration. The appendix appears normal. Vascular/Lymphatic: There are no enlarged abdominal or pelvic lymph nodes. No significant vascular findings are present. Reproductive: Hysterectomy. Prominent soft tissue anterior to the sacrum is unchanged, probably due to a combination of cervix and presacral fibrosis from previous APR. This was not hypermetabolic on PET-CT. Other: None. Musculoskeletal: No acute or significant osseous findings. 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 IMPRESSION: 1. Interval wedge resection of metastasis within the left lower lobe. 2. No evidence of new metastatic disease within the chest, abdomen or pelvis. 3. Slightly increased parastomal herniation of omental fat and colon status post APR. Presacral soft tissue appears unchanged. 4. Port-A-Cath tip in the left brachiocephalic vein. Electronically Signed   By: WRichardean SaleM.D.   On: 01/09/2015 16:29   Dg Chest Port 1 View  01/02/2015  CLINICAL DATA:  Left-sided Port-A-Cath placement EXAM: PORTABLE CHEST 1 VIEW COMPARISON:  11/15/2014 FINDINGS: Left chest wall port a catheter is noted. The tip of the catheter is in the expected location of the proximal aspect of the left brachiocephalic vein. Heart size is normal. No pleural effusion or pneumothorax. There is asymmetric elevation of right hemidiaphragm noted. No  airspace consolidation. IMPRESSION: 1. No pneumothorax after porta cath placement. Tip of the catheter is in the expected location of the proximal aspect  of the left brachiocephalic vein. Electronically Signed   By: Kerby Moors M.D.   On: 01/02/2015 10:49   Dg C-arm 1-60 Min-no Report  01/02/2015  CLINICAL DATA: Portacath insertion C-ARM 1-60 MINUTES Fluoroscopy was utilized by the requesting physician.  No radiographic interpretation.     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 Roxan Hockey on 10/26/2014 demonstrating recurrent Stage IV disease after undergoing Xeloda+XRT finishing on 05/04/2013, followed by APR on 07/26/2013 by Dr. Arnoldo Morale with marked cytoreduction documented, followed by 6 months worth of Xeloda 1000 mg BID 2 weeks on and 1 week off x 6 months.  Surveillance per [facility] guidelines was followed with serial CT imaging of chest demonstrating an enlarging pulmonary nodule as mentioned previously.  Oncology history is up-to-date.  She tolerated treatment well for cycle 1.  She denies any peripheral neuropathy symptoms.  She notes minimal nausea that was aborted with home anti-emetics.  No emeses.  Return in 2 weeks for follow-up.  Continue with adjuvant FOLFOX.  I will add Avastin to begin on cycle 3 of FOLFOX to provide a full 28 days post-op from port placement.    THERAPY PLAN:  Continue with adjuvant FOLFOX.  I will add Avastin to begin on cycle 3 of FOLFOX to provide a full 28 days post-op from port placement.  All questions were answered. The patient knows to call the clinic with any problems, questions or concerns. We can certainly see the patient much sooner if necessary.  Patient and plan discussed with Dr. Ancil Linsey and she is in agreement with the aforementioned.   This note is electronically signed by:  Doy Mince 01/18/2015 2:08 PM

## 2015-01-18 ENCOUNTER — Encounter (HOSPITAL_COMMUNITY): Payer: Medicare Other | Attending: Hematology and Oncology

## 2015-01-18 ENCOUNTER — Other Ambulatory Visit (HOSPITAL_COMMUNITY): Payer: Self-pay | Admitting: Oncology

## 2015-01-18 ENCOUNTER — Encounter (HOSPITAL_COMMUNITY): Payer: Medicare Other

## 2015-01-18 ENCOUNTER — Encounter (HOSPITAL_BASED_OUTPATIENT_CLINIC_OR_DEPARTMENT_OTHER): Payer: Medicare Other | Admitting: Oncology

## 2015-01-18 VITALS — BP 166/71 | HR 88 | Temp 97.4°F | Resp 16 | Wt 230.0 lb

## 2015-01-18 DIAGNOSIS — D649 Anemia, unspecified: Secondary | ICD-10-CM | POA: Diagnosis present

## 2015-01-18 DIAGNOSIS — C20 Malignant neoplasm of rectum: Secondary | ICD-10-CM | POA: Diagnosis present

## 2015-01-18 DIAGNOSIS — C7800 Secondary malignant neoplasm of unspecified lung: Secondary | ICD-10-CM

## 2015-01-18 DIAGNOSIS — Z5111 Encounter for antineoplastic chemotherapy: Secondary | ICD-10-CM

## 2015-01-18 DIAGNOSIS — R911 Solitary pulmonary nodule: Secondary | ICD-10-CM

## 2015-01-18 DIAGNOSIS — E875 Hyperkalemia: Secondary | ICD-10-CM

## 2015-01-18 LAB — CBC WITH DIFFERENTIAL/PLATELET
BASOS ABS: 0 10*3/uL (ref 0.0–0.1)
BASOS PCT: 0 %
Eosinophils Absolute: 0.2 10*3/uL (ref 0.0–0.7)
Eosinophils Relative: 1 %
HCT: 31 % — ABNORMAL LOW (ref 36.0–46.0)
HEMOGLOBIN: 9.7 g/dL — AB (ref 12.0–15.0)
LYMPHS PCT: 5 %
Lymphs Abs: 0.9 10*3/uL (ref 0.7–4.0)
MCH: 27.5 pg (ref 26.0–34.0)
MCHC: 31.3 g/dL (ref 30.0–36.0)
MCV: 87.8 fL (ref 78.0–100.0)
Monocytes Absolute: 0.4 10*3/uL (ref 0.1–1.0)
Monocytes Relative: 2 %
NEUTROS ABS: 17 10*3/uL — AB (ref 1.7–7.7)
NEUTROS PCT: 92 %
PLATELETS: 122 10*3/uL — AB (ref 150–400)
RBC: 3.53 MIL/uL — ABNORMAL LOW (ref 3.87–5.11)
RDW: 13.6 % (ref 11.5–15.5)
WBC MORPHOLOGY: INCREASED
WBC: 18.5 10*3/uL — ABNORMAL HIGH (ref 4.0–10.5)

## 2015-01-18 LAB — COMPREHENSIVE METABOLIC PANEL
ALBUMIN: 3.6 g/dL (ref 3.5–5.0)
ALT: 21 U/L (ref 14–54)
AST: 23 U/L (ref 15–41)
Alkaline Phosphatase: 244 U/L — ABNORMAL HIGH (ref 38–126)
Anion gap: 8 (ref 5–15)
BUN: 32 mg/dL — AB (ref 6–20)
CHLORIDE: 103 mmol/L (ref 101–111)
CO2: 26 mmol/L (ref 22–32)
CREATININE: 1.08 mg/dL — AB (ref 0.44–1.00)
Calcium: 8 mg/dL — ABNORMAL LOW (ref 8.9–10.3)
GFR calc Af Amer: >60 mL/min (ref >60)
GFR calc non Af Amer: 53 mL/min — ABNORMAL LOW (ref >60)
Glucose, Bld: 124 mg/dL — ABNORMAL HIGH (ref 65–99)
Potassium: 5.2 mmol/L — ABNORMAL HIGH (ref 3.5–5.1)
SODIUM: 137 mmol/L (ref 135–145)
Total Bilirubin: 0.4 mg/dL (ref 0.3–1.2)
Total Protein: 7 g/dL (ref 6.5–8.1)

## 2015-01-18 LAB — URINALYSIS, DIPSTICK ONLY
BILIRUBIN URINE: NEGATIVE
Glucose, UA: NEGATIVE mg/dL
Ketones, ur: NEGATIVE mg/dL
NITRITE: NEGATIVE
PROTEIN: NEGATIVE mg/dL
SPECIFIC GRAVITY, URINE: 1.01 (ref 1.005–1.030)
UROBILINOGEN UA: 0.2 mg/dL (ref 0.0–1.0)
pH: 6.5 (ref 5.0–8.0)

## 2015-01-18 MED ORDER — SODIUM CHLORIDE 0.9 % IV SOLN: Freq: Once | INTRAVENOUS | Status: DC

## 2015-01-18 MED ORDER — LEUCOVORIN CALCIUM INJECTION 350 MG
400.0000 mg/m2 | Freq: Once | INTRAMUSCULAR | Status: AC
  Administered 2015-01-18: 868 mg via INTRAVENOUS
  Filled 2015-01-18: qty 43.4

## 2015-01-18 MED ORDER — SODIUM CHLORIDE 0.9 % IJ SOLN
10.0000 mL | INTRAMUSCULAR | Status: DC | PRN
  Administered 2015-01-18: 10 mL
  Filled 2015-01-18: qty 10

## 2015-01-18 MED ORDER — SODIUM POLYSTYRENE SULFONATE PO POWD: Freq: Once | ORAL | Status: DC

## 2015-01-18 MED ORDER — SODIUM CHLORIDE 0.9 % IV SOLN
Freq: Once | INTRAVENOUS | Status: AC
  Administered 2015-01-18: 12:00:00 via INTRAVENOUS
  Filled 2015-01-18: qty 4

## 2015-01-18 MED ORDER — CYANOCOBALAMIN 1000 MCG/ML IJ SOLN
INTRAMUSCULAR | Status: AC
  Filled 2015-01-18: qty 1

## 2015-01-18 MED ORDER — CYANOCOBALAMIN 1000 MCG/ML IJ SOLN
1000.0000 ug | INTRAMUSCULAR | Status: DC
  Administered 2015-01-18: 1000 ug via INTRAMUSCULAR

## 2015-01-18 MED ORDER — OXALIPLATIN CHEMO INJECTION 100 MG/20ML
85.0000 mg/m2 | Freq: Once | INTRAVENOUS | Status: AC
  Administered 2015-01-18: 185 mg via INTRAVENOUS
  Filled 2015-01-18: qty 37

## 2015-01-18 MED ORDER — SODIUM CHLORIDE 0.9 % IV SOLN
2400.0000 mg/m2 | INTRAVENOUS | Status: DC
  Administered 2015-01-18: 5200 mg via INTRAVENOUS
  Filled 2015-01-18: qty 104

## 2015-01-18 MED ORDER — FLUOROURACIL CHEMO INJECTION 2.5 GM/50ML
400.0000 mg/m2 | Freq: Once | INTRAVENOUS | Status: AC
  Administered 2015-01-18: 850 mg via INTRAVENOUS
  Filled 2015-01-18: qty 17

## 2015-01-18 MED ORDER — DEXTROSE 5 % IV SOLN
Freq: Once | INTRAVENOUS | Status: AC
  Administered 2015-01-18: 12:00:00 via INTRAVENOUS

## 2015-01-18 NOTE — Progress Notes (Signed)
See chemotherapy encounter.

## 2015-01-18 NOTE — Patient Instructions (Signed)
[place] [facility] Discharge Instructions for Patients Receiving Chemotherapy  Exam and discussion today with Kirby Crigler, PA. Chemotherapy and B12 injection today. Return as scheduled in 2 weeks for office visit and chemotherapy.  Today you received the following chemotherapy agents:  Oxaliplatin, leucovorin, and 5FU  To help prevent nausea and vomiting after your treatment, we encourage you to take your nausea medication as prescribed.  If you develop nausea and vomiting, or diarrhea that is not controlled by your medication, call the clinic.  The clinic phone number is [phone]. Office hours are Monday-Friday 8:30am-5:00pm.  BELOW ARE SYMPTOMS THAT SHOULD BE REPORTED IMMEDIATELY:  *FEVER GREATER THAN 101.0 F  *CHILLS WITH OR WITHOUT FEVER  NAUSEA AND VOMITING THAT IS NOT CONTROLLED WITH YOUR NAUSEA MEDICATION  *UNUSUAL SHORTNESS OF BREATH  *UNUSUAL BRUISING OR BLEEDING  TENDERNESS IN MOUTH AND THROAT WITH OR WITHOUT PRESENCE OF ULCERS  *URINARY PROBLEMS  *BOWEL PROBLEMS  UNUSUAL RASH Items with * indicate a potential emergency and should be followed up as soon as possible. If you have an emergency after office hours please contact your primary care physician or go to the nearest emergency department.  Please call the clinic during office hours if you have any questions or concerns.   You may also contact the Patient Navigator at [phone] should you have any questions or need assistance in obtaining follow up care. _____________________________________________________________________ Have you asked about our STAR program?    STAR stands for Survivorship Training and Rehabilitation, and this is a nationally recognized cancer care program that focuses on survivorship and rehabilitation.  Cancer and cancer treatments may cause problems, such as, pain, making you feel tired and keeping you from doing the things that you need or want to do. Cancer  rehabilitation can help. Our goal is to reduce these troubling effects and help you have the best quality of life possible.  You may receive a survey from a nurse that asks questions about your current state of health.  Based on the survey results, all eligible patients will be referred to the [facility] program for an evaluation so we can better serve you! A frequently asked questions sheet is available upon request.

## 2015-01-18 NOTE — Progress Notes (Signed)
1430:  Tolerated tx w/o adverse reaction.  VSS.  A&Ox4, in no distress.  Discharged via wheelchair in c/o caretaker for transport home.

## 2015-01-20 ENCOUNTER — Encounter (HOSPITAL_BASED_OUTPATIENT_CLINIC_OR_DEPARTMENT_OTHER): Payer: Medicare Other

## 2015-01-20 VITALS — BP 155/70 | HR 99 | Temp 98.7°F | Resp 20

## 2015-01-20 DIAGNOSIS — C20 Malignant neoplasm of rectum: Secondary | ICD-10-CM | POA: Diagnosis not present

## 2015-01-20 DIAGNOSIS — Z5189 Encounter for other specified aftercare: Secondary | ICD-10-CM

## 2015-01-20 DIAGNOSIS — R911 Solitary pulmonary nodule: Secondary | ICD-10-CM

## 2015-01-20 DIAGNOSIS — C7800 Secondary malignant neoplasm of unspecified lung: Secondary | ICD-10-CM

## 2015-01-20 MED ORDER — PEGFILGRASTIM 6 MG/0.6ML ~~LOC~~ PSKT
6.0000 mg | PREFILLED_SYRINGE | Freq: Once | SUBCUTANEOUS | Status: AC
  Administered 2015-01-20: 6 mg via SUBCUTANEOUS

## 2015-01-20 MED ORDER — HEPARIN SOD (PORK) LOCK FLUSH 100 UNIT/ML IV SOLN
INTRAVENOUS | Status: AC
  Filled 2015-01-20: qty 5

## 2015-01-20 MED ORDER — PEGFILGRASTIM 6 MG/0.6ML ~~LOC~~ PSKT
PREFILLED_SYRINGE | SUBCUTANEOUS | Status: AC
  Filled 2015-01-20: qty 0.6

## 2015-01-20 MED ORDER — HEPARIN SOD (PORK) LOCK FLUSH 100 UNIT/ML IV SOLN
500.0000 [IU] | Freq: Once | INTRAVENOUS | Status: AC | PRN
  Administered 2015-01-20: 500 [IU]

## 2015-01-20 MED ORDER — SODIUM CHLORIDE 0.9 % IJ SOLN
10.0000 mL | INTRAMUSCULAR | Status: DC | PRN
  Administered 2015-01-20: 10 mL
  Filled 2015-01-20: qty 10

## 2015-01-20 NOTE — Progress Notes (Signed)
Patient reports she vomited x 1 yesterday. Caregiver reports the patient vomited x 1 about 45 mins to 1 hour after taking kayexalate. Caregiver reports patient's bowels moved several times after kayexalate and patient has not had any complaints with chemo otherwise. D/C continuous infusion pump and flushed port and de-accessed port needle per protocol. Marland KitchenKipp Laurence arrived today for ONPRO neulasta on body injector. See MAR for administration details. Injector in place and engaged with green light indicator on flashing. Tolerated application with out problems.

## 2015-01-20 NOTE — Patient Instructions (Signed)
[place] at [facility] Discharge Instructions  RECOMMENDATIONS MADE BY THE CONSULTANT AND ANY TEST RESULTS WILL BE SENT TO YOUR REFERRING PHYSICIAN.  Neulasta OnPro body injector placed on right upper arm. Injector will deliver medication between 3 pm and 4 pm tomorrow Saturday 01/21/15. You may safely remove injector after 4 pm tomorrow. Return as scheduled.  Thank you for choosing [place] at [facility] to provide your oncology and hematology care.  To afford each patient quality time with our provider, please arrive at least 15 minutes before your scheduled appointment time.    You need to re-schedule your appointment should you arrive 10 or more minutes late.  We strive to give you quality time with our providers, and arriving late affects you and other patients whose appointments are after yours.  Also, if you no show three or more times for appointments you may be dismissed from the clinic at the providers discretion.     Again, thank you for choosing [facility].  Our hope is that these requests will decrease the amount of time that you wait before being seen by our physicians.       _____________________________________________________________  Should you have questions after your visit to [facility], please contact our office at [phone] between the hours of 8:30 a.m. and 4:30 p.m.  Voicemails left after 4:30 p.m. will not be returned until the following business day.  For prescription refill requests, have your pharmacy contact our office.

## 2015-01-25 ENCOUNTER — Encounter (HOSPITAL_BASED_OUTPATIENT_CLINIC_OR_DEPARTMENT_OTHER): Payer: Medicare Other

## 2015-01-25 ENCOUNTER — Encounter (HOSPITAL_COMMUNITY): Payer: Self-pay

## 2015-01-25 DIAGNOSIS — E611 Iron deficiency: Secondary | ICD-10-CM | POA: Diagnosis not present

## 2015-01-25 MED ORDER — CYANOCOBALAMIN 1000 MCG/ML IJ SOLN
INTRAMUSCULAR | Status: AC
  Filled 2015-01-25: qty 1

## 2015-01-25 MED ORDER — CYANOCOBALAMIN 1000 MCG/ML IJ SOLN
1000.0000 ug | INTRAMUSCULAR | Status: DC
  Administered 2015-01-25: 1000 ug via INTRAMUSCULAR

## 2015-01-25 NOTE — Progress Notes (Signed)
[name]'s reason for visit today is for an injection as scheduled per MD orders. [name] also received vitamin b12 per MD orders; see [facility] for administration details.  [name] tolerated all procedures well and without incident; questions were answered and patient was discharged.

## 2015-01-25 NOTE — Patient Instructions (Signed)
[place] at [facility] Discharge Instructions  RECOMMENDATIONS MADE BY THE CONSULTANT AND ANY TEST RESULTS WILL BE SENT TO YOUR REFERRING PHYSICIAN.  b12 injection today Return as scheduled Please call the clinic if you have any questions or concerns  Thank you for choosing [place] at [facility] to provide your oncology and hematology care.  To afford each patient quality time with our provider, please arrive at least 15 minutes before your scheduled appointment time.    You need to re-schedule your appointment should you arrive 10 or more minutes late.  We strive to give you quality time with our providers, and arriving late affects you and other patients whose appointments are after yours.  Also, if you no show three or more times for appointments you may be dismissed from the clinic at the providers discretion.     Again, thank you for choosing [facility].  Our hope is that these requests will decrease the amount of time that you wait before being seen by our physicians.       _____________________________________________________________  Should you have questions after your visit to [facility], please contact our office at [phone] between the hours of 8:30 a.m. and 4:30 p.m.  Voicemails left after 4:30 p.m. will not be returned until the following business day.  For prescription refill requests, have your pharmacy contact our office.

## 2015-01-30 NOTE — Assessment & Plan Note (Addendum)
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 Roxan Hockey on 10/26/2014 demonstrating recurrent Stage IV disease after undergoing Xeloda+XRT finishing on 05/04/2013, followed by APR on 07/26/2013 by Dr. Arnoldo Morale with marked cytoreduction documented, followed by 6 months worth of Xeloda 1000 mg BID 2 weeks on and 1 week off x 6 months.  Surveillance per [facility] guidelines was followed with serial CT imaging of chest demonstrating an enlarging pulmonary nodule as mentioned previously.  Oncology history is up-to-date.  Avastin was added to her treatment plan today, HOWEVER, she was unable to provide a urine specimen.  She missed the cup and then could not produce a urine specimen in a timely fashion.  It is important to have a baseline urine test to verify no proteinuria prior to starting Avastin therapy.  As a result, Avastin was not given today.  We will need to consider Avastin on Friday, when she get her pump removed or defer until the next cycle.  I will discuss with Dr. Whitney Muse upon her return for guidance regarding this matter.  She tolerated treatment well for cycle 3.  She denies any peripheral neuropathy symptoms.    Hyperkalemia noted.  Etiology unclear.  She is to stop all K+ replacement if she is taking any.  Kayexalate 30 g today PO in the clinic.   Return in 2 weeks for follow-up.

## 2015-01-30 NOTE — Progress Notes (Addendum)
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  Avastin will be added in near future.  INTERVAL HISTORY: [name] 64 y.o. female returns for followup of Locally advanced adenocarcinoma of the rectum, She tolerated combined modality therapy well. Radiation therapy completed on 05/04/2013 which was also the last day she received Xeloda, excellent tolerance, status post APR on 07/26/2013 with marked cytoreduction documented as evidenced by no lymph node involvement. pT3 tumor was found. S/P 6 months of Xeloda at 1000 mg BID 2 weeks on and 1 week off finishing in December 2015.  Serial CT imaging demonstrated an enlarging pulmonary nodule resulting in LLL superior segmentectomy by Dr. Roxan Hockey on 10/26/2014 demonstrating oligometastatic disease of rectal adenocarcinoma, resulting in an upstage to Stage IV disease.    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   03/25/2013 - 05/05/2013 Chemotherapy Xeloda 1500 mg BID 5 days/week with radiation therapy.   07/26/2013 Definitive Surgery Dr. Arnoldo Morale- Flexible sigmoidoscopy, abdominoperineal resection, total abdominal hysterectomy with bilateral salpingo-oophorectomy   08/30/2013 - 03/01/2014 Chemotherapy Xeloda 1000 mg BID 14 days on and 7 days off x 6 months   03/08/2014 Imaging CT CAP- Bilateral pulmonary nodules, including an 8 mm left lower lobe nodule. Metastatic disease is a concern. The left lower lobe nodule has progressed since 03/05/2013, when it measured 5 mm.   05/26/2014 Imaging CT Chest- Continued  enlargement of the superior segment left lower lobe nodule. Malignancy is likely.   In contrast, the right lower lobe nodule is probably benign.   08/17/2014 Imaging CT- Superior segment left lower lobe pulmonary nodule measures stable since the most recent comparison study, but is again noted to have increased in size when comparing to older studies. Continued close attention will be required as neoplasm remains a con   10/26/2014 Surgery thoracoscopic left lower lobe superior segmentectomy. Pathology with metastatic adenocarcinoma c/w colonic primary   01/04/2015 -  Chemotherapy FOLFOX   01/09/2015 Imaging CT CAP- Interval wedge resection of metastasis within the left lower lobe. No evidence of new metastatic disease within the chest, abdomen or pelvis.    I personally reviewed and went over laboratory results with the patient.  The results are noted within this dictation.  Labs will be updated today.  Treatment parameters are met for treatment today.  She notes 1 episode of nausea with vomiting after taking Kayexalate as an outpatient when we received her K+ level with an elevated result.  Otherwise, she denies any nausea or vomiting.  She denies any PN.  She denies any issues with cold intolerance.  She does not know whether she is taking potassium pills.  I do not have record of her updated medications from her group home.  She is advised that if she is, she is to stop her potassium replacement.  Past Medical History  Diagnosis Date  . Hypertension   . Diabetes mellitus     years  . Asthma   . Depression   . Mental retardation     stopped school at 9th grade   . History of recurrent UTIs   .  Shortness of breath     with exertion  . Cancer (HCC)     Rectal   . Arthritis   . Gout   . Seizures ([place])     more than 4 yrs since last seizure. UNknown etiology  . Rectal cancer ([place])   . Iron deficiency 04/27/2014  . Chronic renal disease, stage 3, moderately decreased glomerular  filtration rate between 30-59 mL/min/1.73 square meter 04/27/2014  . Numbness and tingling in hands     x several months   . GERD (gastroesophageal reflux disease)   . Vitamin B 12 deficiency 02/01/2015    Incidentally found without antibody testing.      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     has No Known Allergies.  Current Outpatient Prescriptions on File Prior to Visit  Medication Sig Dispense Refill  . amLODipine (NORVASC) 10 MG tablet Take 1 tablet (10 mg total) by mouth every morning. 30 tablet 1  . aspirin EC 81 MG tablet Take 81 mg by mouth daily.    .Marland Kitchendextrose 5 % SOLN 1,000 mL with fluorouracil 5 GM/100ML SOLN Inject into the vein. Every 14 days over 46 hours    . diphenhydrAMINE (BENADRYL) 25 MG tablet Take 1 tablet (25 mg total) by mouth every 6 (six) hours. (Patient not taking: Reported on 02/01/2015) 14 tablet 0  . docusate sodium (COLACE) 100 MG capsule Take 100 mg by mouth 2 (two) times daily.    . Emollient (CERAVE) LOTN Apply 1 application topically daily. Apply to both feet    . famotidine (PEPCID) 20 MG tablet Take 1 tablet (20 mg total) by mouth 2 (two) times daily. 14 tablet 0  . ferrous sulfate 325 (65 FE) MG tablet TAKE (1) TABLET BY MOUTH TWICE  DAILY. 60 tablet 4  . fish oil-omega-3 fatty acids 1000 MG capsule Take 1 g by mouth 2 (two) times daily.    . Fluticasone-Salmeterol (ADVAIR) 250-50 MCG/DOSE AEPB Inhale 1 puff into the lungs every 12 (twelve) hours.    .Marland Kitchenglucose blood (ACCU-CHEK AVIVA PLUS) test strip 1 each by Other route daily. Use as instructed    . HYDROcodone-acetaminophen (NORCO/VICODIN) 5-325 MG tablet Take 1 tablet by mouth every 4 (four) hours as needed for moderate pain. For pain not relieved by tramadol (Patient not taking: Reported on 01/10/2015) 40 tablet 0  . ibuprofen (ADVIL,MOTRIN) 600 MG tablet Take 600 mg by mouth every 6 (six) hours as needed for moderate pain.    . Insulin Glargine (LANTUS SOLOSTAR) 100 UNIT/ML SOPN Inject 12 Units into the skin at bedtime.     .Marland Kitchenleucovorin 50 MG injection Inject into the vein. Every 14 days    . lidocaine-prilocaine (EMLA) cream Apply a quarter size amount to port site 1 hour prior to chemo. Do not rub in. Cover with plastic wrap. 30 g 3  . lisinopril (PRINIVIL,ZESTRIL) 10 MG tablet Take 1 tablet (10 mg total) by mouth daily. [place]  tablet 1  . loperamide (IMODIUM) 2 MG capsule Take 2 caps after 1st loose stool and then 1 cap q2h until 12 hours have passed without having a loose stool. At bedtime, take 2 caps. Then 2 caps q4 h until morning. 45 capsule 0  . loratadine-pseudoephedrine (LORATADINE-D 24HR) 10-240 MG per 24 hr tablet Take 1 tablet by mouth daily.    . metFORMIN (GLUCOPHAGE) 1000 MG tablet Take 1,000 mg by mouth 2 (two) times daily with a meal.    . metoprolol succinate (TOPROL-XL) 50 MG 24 hr tablet Take 100 mg by mouth at bedtime.     . montelukast (SINGULAIR) 10 MG tablet Take 10 mg by mouth at bedtime.    . ondansetron (ZOFRAN) 8 MG tablet Take 1 tablet (8 mg total) by mouth every 8 (eight) hours as needed for nausea or vomiting. #1 nausea med to take 30 tablet 2  . OXALIPLATIN IV Inject into the vein every 14 (fourteen) days. Starting 10/19    . pantoprazole  (PROTONIX) 40 MG tablet Take 40 mg by mouth every morning.     . Pegfilgrastim (NEULASTA ONPRO [place]) Inject into the skin. Every 14 days    . phenytoin (DILANTIN) 100 MG ER capsule Take 100 mg by mouth 2 (two) times daily.    . pravastatin (PRAVACHOL) 40 MG tablet Take 40 mg by mouth every evening.     . predniSONE (DELTASONE) 10 MG tablet Take 4 tablets (40 mg total) by mouth daily. 20 tablet 0  . PROAIR HFA 108 (90 BASE) MCG/ACT inhaler     . prochlorperazine (COMPAZINE) 10 MG tablet Take 1 tablet (10 mg total) by mouth every 6 (six) hours as needed for nausea or vomiting. #2 nausea med to take 30 tablet 2  . silver sulfADIAZINE (SILVADENE) 1 % cream Apply 1 application topically every morning.     . sodium chloride (OCEAN) 0.65 % SOLN nasal spray Place 2 sprays into both nostrils 4 (four) times daily as needed for congestion.    . traMADol (ULTRAM) 50 MG tablet Take 50 mg by mouth every 4 (four) hours as needed for moderate pain.     [name] triamcinolone cream (KENALOG) 0.1 % Apply 1 application topically 2 (two) times daily. To stoma     Current Facility-Administered Medications on File Prior to Visit  Medication Dose Route Frequency Provider Last Rate Last Dose  . fluorouracil (ADRUCIL) 5,200 mg in sodium chloride 0.9 % 146 mL chemo infusion  2,400 mg/m2 (Treatment Plan Actual) Intravenous 1 day or 1 dose Patrici Ranks, MD   5,200 mg at 02/01/15 1420    Past Surgical History  Procedure Laterality Date  . Abdominal hysterectomy    . Multiple extractions with alveoloplasty N/A 11/23/2012    Procedure: MULTIPLE EXTRACION 1, 2, 4, 5, 6, 7, 8, 9, 10, 11, 12, 13, 14, 17, 18, 20, 23, 24, 25, 26, 28, 29, 32 WITH ALVEOLOPLASTY, REMOVE BILATERAL TORI;  Surgeon: Gae Bon, DDS;  Location: [facility];  Service: Oral Surgery;  Laterality: N/A;  . Colonoscopy with esophagogastroduodenoscopy (egd) N/A 01/14/2013    Procedure: COLONOSCOPY WITH ESOPHAGOGASTRODUODENOSCOPY (EGD);  Surgeon: Rogene Houston, MD;   [facility];  Service: Endoscopy;  Laterality: N/A;  250-moved to 315 Ann to notify pt  . Colonoscopy N/A 02/04/2013    Procedure: COLONOSCOPY;  Surgeon: Rogene Houston, MD;  [facility];  Service: Endoscopy;  Laterality: N/A;  225  . Eus N/A 02/18/2013  Procedure: LOWER ENDOSCOPIC ULTRASOUND (EUS);  Surgeon: Milus Banister, MD;  Location[facility];  Service: Endoscopy;  Laterality: N/A;  . Flexible sigmoidoscopy N/A 07/26/2013    Procedure: FLEXIBLE SIGMOIDOSCOPY;  Surgeon: Jamesetta So, MD;  [facility];  Service: General;  Laterality: N/A;  . Abdominal perineal bowel resection N/A 07/26/2013    Procedure:  ABDOMINAL PERINEAL RESECTION;  Surgeon: Jamesetta So, MD;  [facility];  Service: General;  Laterality: N/A;  . Supracervical abdominal hysterectomy N/A 07/26/2013    Procedure: HYSTERECTOMY SUPRACERVICAL ABDOMINAL ;  Surgeon: Jamesetta So, MD;  [facility];  Service: General;  Laterality: N/A;  . Salpingoophorectomy Bilateral 07/26/2013    Procedure: SALPINGO OOPHORECTOMY;  Surgeon: Jamesetta So, MD;  [facility];  Service: General;  Laterality: Bilateral;  . Colostomy Left 07/26/2013    Procedure: COLOSTOMY;  Surgeon: Jamesetta So, MD;  [facility];  Service: General;  Laterality: Left;  . Colonoscopy N/A 05/12/2014    Procedure: COLONOSCOPY;  Surgeon: Rogene Houston, MD;  [facility];  Service: Endoscopy;  Laterality: N/A;  1030  . Colon surgery  2015    bowel resection/w colostomy  . Video assisted thoracoscopy Left 10/26/2014    Procedure: LEFT VIDEO ASSISTED THORACOSCOPY;  Surgeon: [name], MD;  Location: [place];  Service: Thoracic;  Laterality: Left;  . Segmentecomy Left 10/26/2014    Procedure: LEFT LOWER LOBE SUPERIOR SEGMENTECTOMY;  Surgeon: [name], MD;  Location: [place];  Service: Thoracic;  Laterality: Left;  . Lymph node dissection Left 10/26/2014    Procedure: LYMPH NODE  DISSECTION;  Surgeon: [name], MD;  Location: [place];  Service: Thoracic;  Laterality: Left;  . Portacath placement Left 01/02/2015    Procedure: INSERTION PORT-A-CATH;  Surgeon: Aviva Signs, MD;  [facility];  Service: General;  Laterality: Left;    Denies any headaches, dizziness, double vision, fevers, chills, night sweats, vomiting, diarrhea, constipation, chest pain, heart palpitations, shortness of breath, blood in stool, black tarry stool, urinary pain, urinary burning, urinary frequency, hematuria.   PHYSICAL EXAMINATION  ECOG PERFORMANCE STATUS: 2 - Symptomatic, <50% confined to bed  There were no vitals filed for this visit.  GENERAL:alert, healthy, no distress, well nourished, comfortable, cooperative, obese, smiling and accompanied by caregiver SKIN: skin color, texture, turgor are normal. HEAD: Normocephalic, No masses, lesions, tenderness or abnormalities EYES: normal, PERRLA, EOMI, Conjunctiva are pink and non-injected EARS: External ears normal OROPHARYNX:lips, buccal mucosa, and tongue normal and mucous membranes are moist  NECK: supple, no adenopathy, trachea midline LYMPH:  no palpable lymphadenopathy BREAST:not examined LUNGS: clear to auscultation  HEART: regular rate & rhythm ABDOMEN:abdomen soft, non-tender, obese and normal bowel sounds BACK: Back symmetric, no curvature., No CVA tenderness EXTREMITIES:less then 2 second capillary refill, no joint deformities, effusion, or inflammation, no skin discoloration, no cyanosis  NEURO: no focal motor/sensory deficits, gait normal   LABORATORY DATA: CBC    Component Value Date/Time   WBC 24.6* 02/01/2015 0922   RBC 3.21* 02/01/2015 0922   RBC 3.84* 11/07/2009 1649   HGB 9.1* 02/01/2015 0922   HCT 27.9* 02/01/2015 0922   PLT 134* 02/01/2015 0922   MCV 86.9 02/01/2015 0922   MCH 28.3 02/01/2015 0922   MCHC 32.6 02/01/2015 0922   RDW 14.4 02/01/2015 0922   LYMPHSABS 1.4 02/01/2015 0922    MONOABS 0.9 02/01/2015 0922   EOSABS 0.3 02/01/2015 0922   BASOSABS 0.1 02/01/2015 1610  Chemistry      Component Value Date/Time   NA 135 02/01/2015 0922   K 5.5* 02/01/2015 0922   CL 104 02/01/2015 0922   CO2 23 02/01/2015 0922   BUN 33* 02/01/2015 0922   CREATININE 1.12* 02/01/2015 0922   CREATININE 1.11* 02/08/2013 1451      Component Value Date/Time   CALCIUM 8.0* 02/01/2015 0922   ALKPHOS 276* 02/01/2015 0922   AST 20 02/01/2015 0922   ALT 24 02/01/2015 0922   BILITOT 0.2* 02/01/2015 0922      Lab Results  Component Value Date   CEA 4.5 01/04/2015     PENDING LABS:   RADIOGRAPHIC STUDIES:  Ct Chest W Contrast  01/09/2015  CLINICAL DATA:  Stage IV colorectal cancer. Chemotherapy ongoing. Subsequent encounter. EXAM: CT CHEST, ABDOMEN, AND PELVIS WITH CONTRAST TECHNIQUE: Multidetector CT imaging of the chest, abdomen and pelvis was performed following the standard protocol during bolus administration of intravenous contrast. CONTRAST:  165m OMNIPAQUE IOHEXOL 300 MG/ML  SOLN COMPARISON:  CTs 03/07/2014.  PET-CT 10/07/2014. FINDINGS: CT CHEST FINDINGS Mediastinum/Nodes: There are no enlarged mediastinal, hilar or axillary lymph nodes. Small axillary lymph nodes bilaterally are stable. The heart size is normal. There is no pericardial effusion. Left subclavian Port-A-Cath tip in the left brachiocephalic vein. No significant vascular findings. Lungs/Pleura: There is no pleural effusion.Interval wedge resection of metastasis in the superior segment of the left lower lobe. For no new nodules demonstrated. There are low lung volumes and mild perihilar atelectasis bilaterally. Musculoskeletal/Chest wall: No chest wall mass or lytic lesion identified. Heterogeneous sclerosis throughout the spine is again noted associated with changes of diffuse idiopathic skeletal hyperostosis. These marrow changes are chronic, and given the associated findings in the bony pelvis may be related  to Paget's disease. CT ABDOMEN AND PELVIS FINDINGS Hepatobiliary: The hepatic density is diffusely decreased. No focal hepatic lesions are identified. No evidence of gallstones, gallbladder wall thickening or biliary dilatation. Pancreas: Atrophy without focal abnormality or surrounding inflammation. Spleen: Normal in size without focal abnormality. Adrenals/Urinary Tract: The adrenal glands appear stable with low-density prominence of the right gland, consistent with an adenoma. The right kidney appears stable without significant findings. Advanced cortical scarring and atrophy on the left are again noted. No evidence of renal mass or hydronephrosis. Possible postsurgical changes in the left renal pelvis, stable. The bladder appears unremarkable. Stomach/Bowel: No evidence of bowel wall thickening, distention or surrounding inflammatory change. Descending colostomy noted with increased parastomal herniation of omental fat and colon. No evidence of incarceration. The appendix appears normal. Vascular/Lymphatic: There are no enlarged abdominal or pelvic lymph nodes. No significant vascular findings are present. Reproductive: Hysterectomy. Prominent soft tissue anterior to the sacrum is unchanged, probably due to a combination of cervix and presacral fibrosis from previous APR. This was not hypermetabolic on PET-CT. Other: None. Musculoskeletal: No acute or significant osseous findings. 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 IMPRESSION: 1. Interval wedge resection of metastasis within the left lower lobe. 2. No evidence of new metastatic disease within the chest, abdomen or pelvis. 3. Slightly increased parastomal herniation of omental fat and colon status post APR. Presacral soft tissue appears unchanged. 4. Port-A-Cath tip in the left brachiocephalic vein. Electronically Signed   By: WRichardean SaleM.D.   On: 01/09/2015 16:29   Ct  Abdomen Pelvis W Contrast  01/09/2015  CLINICAL DATA:  Stage IV colorectal cancer. Chemotherapy ongoing. Subsequent encounter. EXAM: CT CHEST, ABDOMEN,  AND PELVIS WITH CONTRAST TECHNIQUE: Multidetector CT imaging of the chest, abdomen and pelvis was performed following the standard protocol during bolus administration of intravenous contrast. CONTRAST:  117m OMNIPAQUE IOHEXOL 300 MG/ML  SOLN COMPARISON:  CTs 03/07/2014.  PET-CT 10/07/2014. FINDINGS: CT CHEST FINDINGS Mediastinum/Nodes: There are no enlarged mediastinal, hilar or axillary lymph nodes. Small axillary lymph nodes bilaterally are stable. The heart size is normal. There is no pericardial effusion. Left subclavian Port-A-Cath tip in the left brachiocephalic vein. No significant vascular findings. Lungs/Pleura: There is no pleural effusion.Interval wedge resection of metastasis in the superior segment of the left lower lobe. For no new nodules demonstrated. There are low lung volumes and mild perihilar atelectasis bilaterally. Musculoskeletal/Chest wall: No chest wall mass or lytic lesion identified. Heterogeneous sclerosis throughout the spine is again noted associated with changes of diffuse idiopathic skeletal hyperostosis. These marrow changes are chronic, and given the associated findings in the bony pelvis may be related to Paget's disease. CT ABDOMEN AND PELVIS FINDINGS Hepatobiliary: The hepatic density is diffusely decreased. No focal hepatic lesions are identified. No evidence of gallstones, gallbladder wall thickening or biliary dilatation. Pancreas: Atrophy without focal abnormality or surrounding inflammation. Spleen: Normal in size without focal abnormality. Adrenals/Urinary Tract: The adrenal glands appear stable with low-density prominence of the right gland, consistent with an adenoma. The right kidney appears stable without significant findings. Advanced cortical scarring and atrophy on the left are again noted. No evidence of renal  mass or hydronephrosis. Possible postsurgical changes in the left renal pelvis, stable. The bladder appears unremarkable. Stomach/Bowel: No evidence of bowel wall thickening, distention or surrounding inflammatory change. Descending colostomy noted with increased parastomal herniation of omental fat and colon. No evidence of incarceration. The appendix appears normal. Vascular/Lymphatic: There are no enlarged abdominal or pelvic lymph nodes. No significant vascular findings are present. Reproductive: Hysterectomy. Prominent soft tissue anterior to the sacrum is unchanged, probably due to a combination of cervix and presacral fibrosis from previous APR. This was not hypermetabolic on PET-CT. Other: None. Musculoskeletal: No acute or significant osseous findings. 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 IMPRESSION: 1. Interval wedge resection of metastasis within the left lower lobe. 2. No evidence of new metastatic disease within the chest, abdomen or pelvis. 3. Slightly increased parastomal herniation of omental fat and colon status post APR. Presacral soft tissue appears unchanged. 4. Port-A-Cath tip in the left brachiocephalic vein. Electronically Signed   By: WRichardean SaleM.D.   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 HRoxan Hockeyon 10/26/2014 demonstrating recurrent Stage IV disease after undergoing Xeloda+XRT finishing on 05/04/2013, followed by APR on 07/26/2013 by Dr. JArnoldo Moralewith marked cytoreduction documented, followed by 6 months worth of Xeloda 1000 mg BID 2 weeks on and 1 week off x 6 months.  Surveillance per NCCN guidelines was followed with serial CT imaging of chest demonstrating an enlarging pulmonary nodule as mentioned previously.  Oncology  history is up-to-date.  Avastin was added to her treatment plan today, HOWEVER, she was unable to provide a urine specimen.  She missed the cup and then could not produce a urine specimen in a timely fashion.  It is important to have a baseline urine test to verify no proteinuria prior to starting Avastin therapy.  As a result,  Avastin was not given today.  We will need to consider Avastin on Friday, when she get her pump removed or defer until the next cycle.  I will discuss with Dr. Whitney Muse upon her return for guidance regarding this matter.  She tolerated treatment well for cycle 3.  She denies any peripheral neuropathy symptoms.    Hyperkalemia noted.  Etiology unclear.  She is to stop all K+ replacement if she is taking any.  Kayexalate 30 g today PO in the clinic.   Return in 2 weeks for follow-up.  Vitamin B 12 deficiency B12 supportive therapy plan built monthly.  Following completion of Tx, it would not be unreasonable to try PO/SL replacement.  THERAPY PLAN:  Continue with adjuvant FOLFOX.  Will add Avastin upcoming.  Not given today due to difficulties ascertaining a urine sample.  All questions were answered. The patient knows to call the clinic with any problems, questions or concerns. We can certainly see the patient much sooner if necessary.  Patient and plan discussed with Dr. Ancil Linsey and she is in agreement with the aforementioned.   This note is electronically signed by: Doy Mince 02/01/2015 3:40 PM

## 2015-02-01 ENCOUNTER — Encounter (HOSPITAL_BASED_OUTPATIENT_CLINIC_OR_DEPARTMENT_OTHER): Payer: Medicare Other

## 2015-02-01 ENCOUNTER — Encounter (HOSPITAL_BASED_OUTPATIENT_CLINIC_OR_DEPARTMENT_OTHER): Payer: Medicare Other | Admitting: Oncology

## 2015-02-01 ENCOUNTER — Encounter (HOSPITAL_COMMUNITY): Payer: Self-pay | Admitting: Oncology

## 2015-02-01 ENCOUNTER — Encounter (HOSPITAL_COMMUNITY): Payer: Medicare Other

## 2015-02-01 VITALS — BP 160/71 | HR 92 | Temp 98.7°F | Resp 18 | Wt 230.0 lb

## 2015-02-01 DIAGNOSIS — C20 Malignant neoplasm of rectum: Secondary | ICD-10-CM

## 2015-02-01 DIAGNOSIS — R911 Solitary pulmonary nodule: Secondary | ICD-10-CM

## 2015-02-01 DIAGNOSIS — E875 Hyperkalemia: Secondary | ICD-10-CM

## 2015-02-01 DIAGNOSIS — C7802 Secondary malignant neoplasm of left lung: Secondary | ICD-10-CM

## 2015-02-01 DIAGNOSIS — E538 Deficiency of other specified B group vitamins: Secondary | ICD-10-CM

## 2015-02-01 DIAGNOSIS — C7800 Secondary malignant neoplasm of unspecified lung: Secondary | ICD-10-CM

## 2015-02-01 DIAGNOSIS — Z5111 Encounter for antineoplastic chemotherapy: Secondary | ICD-10-CM

## 2015-02-01 HISTORY — DX: Deficiency of other specified B group vitamins: E53.8

## 2015-02-01 LAB — CBC WITH DIFFERENTIAL/PLATELET
BASOS PCT: 0 %
Basophils Absolute: 0.1 10*3/uL (ref 0.0–0.1)
EOS ABS: 0.3 10*3/uL (ref 0.0–0.7)
Eosinophils Relative: 1 %
HCT: 27.9 % — ABNORMAL LOW (ref 36.0–46.0)
Hemoglobin: 9.1 g/dL — ABNORMAL LOW (ref 12.0–15.0)
Lymphocytes Relative: 6 %
Lymphs Abs: 1.4 10*3/uL (ref 0.7–4.0)
MCH: 28.3 pg (ref 26.0–34.0)
MCHC: 32.6 g/dL (ref 30.0–36.0)
MCV: 86.9 fL (ref 78.0–100.0)
MONO ABS: 0.9 10*3/uL (ref 0.1–1.0)
MONOS PCT: 4 %
Neutro Abs: 22 10*3/uL — ABNORMAL HIGH (ref 1.7–7.7)
Neutrophils Relative %: 90 %
PLATELETS: 134 10*3/uL — AB (ref 150–400)
RBC: 3.21 MIL/uL — ABNORMAL LOW (ref 3.87–5.11)
RDW: 14.4 % (ref 11.5–15.5)
WBC Morphology: INCREASED
WBC: 24.6 10*3/uL — ABNORMAL HIGH (ref 4.0–10.5)

## 2015-02-01 LAB — COMPREHENSIVE METABOLIC PANEL
ALT: 24 U/L (ref 14–54)
ANION GAP: 8 (ref 5–15)
AST: 20 U/L (ref 15–41)
Albumin: 3.6 g/dL (ref 3.5–5.0)
Alkaline Phosphatase: 276 U/L — ABNORMAL HIGH (ref 38–126)
BUN: 33 mg/dL — ABNORMAL HIGH (ref 6–20)
CALCIUM: 8 mg/dL — AB (ref 8.9–10.3)
CHLORIDE: 104 mmol/L (ref 101–111)
CO2: 23 mmol/L (ref 22–32)
Creatinine, Ser: 1.12 mg/dL — ABNORMAL HIGH (ref 0.44–1.00)
GFR calc non Af Amer: 51 mL/min — ABNORMAL LOW (ref >60)
GFR, EST AFRICAN AMERICAN: 59 mL/min — AB (ref >60)
Glucose, Bld: 148 mg/dL — ABNORMAL HIGH (ref 65–99)
Potassium: 5.5 mmol/L — ABNORMAL HIGH (ref 3.5–5.1)
SODIUM: 135 mmol/L (ref 135–145)
Total Bilirubin: 0.2 mg/dL — ABNORMAL LOW (ref 0.3–1.2)
Total Protein: 6.7 g/dL (ref 6.5–8.1)

## 2015-02-01 LAB — URINALYSIS, DIPSTICK ONLY
Bilirubin Urine: NEGATIVE
Glucose, UA: NEGATIVE mg/dL
KETONES UR: NEGATIVE mg/dL
NITRITE: NEGATIVE
PH: 5 (ref 5.0–8.0)
Protein, ur: NEGATIVE mg/dL

## 2015-02-01 MED ORDER — SODIUM CHLORIDE 0.9 % IV SOLN
Freq: Once | INTRAVENOUS | Status: AC
  Administered 2015-02-01: 11:00:00 via INTRAVENOUS
  Filled 2015-02-01: qty 4

## 2015-02-01 MED ORDER — FLUOROURACIL CHEMO INJECTION 2.5 GM/50ML
400.0000 mg/m2 | Freq: Once | INTRAVENOUS | Status: AC
  Administered 2015-02-01: 850 mg via INTRAVENOUS
  Filled 2015-02-01: qty 17

## 2015-02-01 MED ORDER — LEUCOVORIN CALCIUM INJECTION 350 MG
400.0000 mg/m2 | Freq: Once | INTRAVENOUS | Status: AC
  Administered 2015-02-01: 868 mg via INTRAVENOUS
  Filled 2015-02-01: qty 43.4

## 2015-02-01 MED ORDER — OXALIPLATIN CHEMO INJECTION 100 MG/20ML
85.0000 mg/m2 | Freq: Once | INTRAVENOUS | Status: AC
  Administered 2015-02-01: 185 mg via INTRAVENOUS
  Filled 2015-02-01: qty 37

## 2015-02-01 MED ORDER — SODIUM CHLORIDE 0.9 % IV SOLN
2400.0000 mg/m2 | INTRAVENOUS | Status: DC
  Administered 2015-02-01: 5200 mg via INTRAVENOUS
  Filled 2015-02-01: qty 104

## 2015-02-01 MED ORDER — CYANOCOBALAMIN 1000 MCG/ML IJ SOLN
1000.0000 ug | Freq: Once | INTRAMUSCULAR | Status: AC
  Administered 2015-02-01: 1000 ug via INTRAMUSCULAR
  Filled 2015-02-01: qty 1

## 2015-02-01 MED ORDER — DEXTROSE 5 % IV SOLN
Freq: Once | INTRAVENOUS | Status: AC
  Administered 2015-02-01: 11:00:00 via INTRAVENOUS

## 2015-02-01 MED ORDER — SODIUM POLYSTYRENE SULFONATE 15 GM/60ML PO SUSP
30.0000 g | Freq: Once | ORAL | Status: AC
  Administered 2015-02-01: 30 g via ORAL
  Filled 2015-02-01: qty 120

## 2015-02-01 NOTE — Assessment & Plan Note (Signed)
B12 supportive therapy plan built monthly.  Following completion of Tx, it would not be unreasonable to try PO/SL replacement.

## 2015-02-01 NOTE — Progress Notes (Signed)
Per Meriel Flavors, ok to treat with chemo today. He will order kayexalate and B12 injection today. Patient unable to provide urine specimen after 2 attempts (still missed urinating in [name] with assistance). Patient scheduled to start avastin today. Discussed with Meriel Flavors. He discussed with patient and her guardian. We will send specimen cup home with patient, she is to bring urine specimen with her when she comes in Friday for pump removal. We will plan for avastin infusion at that time.  [name] presents today for injection per MD orders. B12 1000 mcg administered IM in right Upper Arm. Administration without incident. Patient tolerated well.  Tolerated chemo well. Continuous infusion pump intact on patient discharge. Discharge home via wheelchair with caregiver.

## 2015-02-01 NOTE — Patient Instructions (Addendum)
..  [place] at [facility] Discharge Instructions  RECOMMENDATIONS MADE BY THE CONSULTANT AND ANY TEST RESULTS WILL BE SENT TO YOUR REFERRING PHYSICIAN.  B12 today and monthly. Potassium level elevated today.Kayexalate given today. IF PATIENT IS TAKING ANY POTASSIUM AT HOME PLEASE DISCONTINUE USE. OBTAIN URINE SPECIMEN FRIDAY PRIOR TO COMING TO CLINIC. WE WILL NEED URINE SPECIMEN TO TEST PRIOR TO PATIENT RECEIVING AVASTIN. PLAN FOR HER TO HAVE AN INFUSION HERE FRIDAY BEFORE REMOVING PUMP. Return in 2 weeks for follow up  Thank you for choosing [place] at [facility] to provide your oncology and hematology care.  To afford each patient quality time with our provider, please arrive at least 15 minutes before your scheduled appointment time.    You need to re-schedule your appointment should you arrive 10 or more minutes late.  We strive to give you quality time with our providers, and arriving late affects you and other patients whose appointments are after yours.  Also, if you no show three or more times for appointments you may be dismissed from the clinic at the providers discretion.     Again, thank you for choosing [facility].  Our hope is that these requests will decrease the amount of time that you wait before being seen by our physicians.       _____________________________________________________________  Should you have questions after your visit to [facility], please contact our office at [phone] between the hours of 8:30 a.m. and 4:30 p.m.  Voicemails left after 4:30 p.m. will not be returned until the following business day.  For prescription refill requests, have your pharmacy contact our office.

## 2015-02-03 ENCOUNTER — Encounter (HOSPITAL_BASED_OUTPATIENT_CLINIC_OR_DEPARTMENT_OTHER): Payer: Medicare Other

## 2015-02-03 ENCOUNTER — Encounter (HOSPITAL_COMMUNITY): Payer: Self-pay

## 2015-02-03 ENCOUNTER — Encounter (HOSPITAL_COMMUNITY): Payer: Medicare Other

## 2015-02-03 VITALS — BP 174/91 | HR 103 | Temp 98.5°F | Resp 18 | Wt 230.0 lb

## 2015-02-03 DIAGNOSIS — C7800 Secondary malignant neoplasm of unspecified lung: Secondary | ICD-10-CM

## 2015-02-03 DIAGNOSIS — R911 Solitary pulmonary nodule: Secondary | ICD-10-CM

## 2015-02-03 DIAGNOSIS — Z5112 Encounter for antineoplastic immunotherapy: Secondary | ICD-10-CM | POA: Diagnosis not present

## 2015-02-03 DIAGNOSIS — Z5189 Encounter for other specified aftercare: Secondary | ICD-10-CM

## 2015-02-03 DIAGNOSIS — C20 Malignant neoplasm of rectum: Secondary | ICD-10-CM

## 2015-02-03 MED ORDER — SODIUM CHLORIDE 0.9 % IJ SOLN: 10.0000 mL | INTRAMUSCULAR | Status: DC | PRN

## 2015-02-03 MED ORDER — HEPARIN SOD (PORK) LOCK FLUSH 100 UNIT/ML IV SOLN
500.0000 [IU] | Freq: Once | INTRAVENOUS | Status: AC | PRN
  Administered 2015-02-03: 500 [IU]

## 2015-02-03 MED ORDER — HEPARIN SOD (PORK) LOCK FLUSH 100 UNIT/ML IV SOLN
INTRAVENOUS | Status: AC
  Filled 2015-02-03: qty 5

## 2015-02-03 MED ORDER — PEGFILGRASTIM 6 MG/0.6ML ~~LOC~~ PSKT
6.0000 mg | PREFILLED_SYRINGE | Freq: Once | SUBCUTANEOUS | Status: AC
  Administered 2015-02-03: 6 mg via SUBCUTANEOUS

## 2015-02-03 MED ORDER — HEPARIN SOD (PORK) LOCK FLUSH 100 UNIT/ML IV SOLN: 500.0000 [IU] | Freq: Once | INTRAVENOUS | Status: DC | PRN

## 2015-02-03 MED ORDER — SODIUM CHLORIDE 0.9 % IV SOLN
Freq: Once | INTRAVENOUS | Status: AC
  Administered 2015-02-03: 12:00:00 via INTRAVENOUS

## 2015-02-03 MED ORDER — SODIUM CHLORIDE 0.9 % IV SOLN
5.0000 mg/kg | Freq: Once | INTRAVENOUS | Status: AC
  Administered 2015-02-03: 525 mg via INTRAVENOUS
  Filled 2015-02-03: qty 21

## 2015-02-03 MED ORDER — PEGFILGRASTIM 6 MG/0.6ML ~~LOC~~ PSKT
PREFILLED_SYRINGE | SUBCUTANEOUS | Status: AC
  Filled 2015-02-03: qty 0.6

## 2015-02-03 NOTE — Patient Instructions (Signed)
[facility] Discharge Instructions for Patients Receiving Chemotherapy  Today you received the following chemotherapy agents avastin Your neulasta will start infusion around 3:15pm tomorrow It should finish about 4pm tomorrow You can take the neulasta off your arm about 5pm tomorrow evening Follow up as scheduled Please call the clinic if you have any questions or concerns  To help prevent nausea and vomiting after your treatment, we encourage you to take your nausea medication    If you develop nausea and vomiting, or diarrhea that is not controlled by your medication, call the clinic.  The clinic phone number is [phone]. Office hours are Monday-Friday 8:30am-5:00pm.  BELOW ARE SYMPTOMS THAT SHOULD BE REPORTED IMMEDIATELY:  *FEVER GREATER THAN 101.0 F  *CHILLS WITH OR WITHOUT FEVER  NAUSEA AND VOMITING THAT IS NOT CONTROLLED WITH YOUR NAUSEA MEDICATION  *UNUSUAL SHORTNESS OF BREATH  *UNUSUAL BRUISING OR BLEEDING  TENDERNESS IN MOUTH AND THROAT WITH OR WITHOUT PRESENCE OF ULCERS  *URINARY PROBLEMS  *BOWEL PROBLEMS  UNUSUAL RASH Items with * indicate a potential emergency and should be followed up as soon as possible. If you have an emergency after office hours please contact your primary care physician or go to the nearest emergency department.  Please call the clinic during office hours if you have any questions or concerns.   You may also contact the Patient Navigator at [phone] should you have any questions or need assistance in obtaining follow up care.

## 2015-02-03 NOTE — Progress Notes (Signed)
Makina Trull Tolerated avastin well today Discharged ambulatory  Pump removed   .Marland KitchenLouetta Thorntonpresents today for neulasta OBI placement per MD orders. OBI device filled per protocol and placed on right Upper Arm. Needle/catheter placement noted prior to patient leaving. Tolerated without incident and aware of injection to be delivered in  27 hours.

## 2015-02-03 NOTE — Progress Notes (Signed)
Please see chemotherapy appt for more information

## 2015-02-07 ENCOUNTER — Ambulatory Visit (INDEPENDENT_AMBULATORY_CARE_PROVIDER_SITE_OTHER): Payer: Medicare Other | Admitting: Internal Medicine

## 2015-02-15 ENCOUNTER — Encounter (HOSPITAL_BASED_OUTPATIENT_CLINIC_OR_DEPARTMENT_OTHER): Payer: Medicare Other | Admitting: Hematology & Oncology

## 2015-02-15 ENCOUNTER — Encounter (HOSPITAL_COMMUNITY): Payer: Medicare Other

## 2015-02-15 ENCOUNTER — Encounter (HOSPITAL_COMMUNITY): Payer: Self-pay | Admitting: Hematology & Oncology

## 2015-02-15 VITALS — BP 186/62 | HR 110 | Temp 98.2°F | Resp 18 | Wt 227.0 lb

## 2015-02-15 DIAGNOSIS — D649 Anemia, unspecified: Secondary | ICD-10-CM | POA: Diagnosis not present

## 2015-02-15 DIAGNOSIS — M899 Disorder of bone, unspecified: Secondary | ICD-10-CM

## 2015-02-15 DIAGNOSIS — Z720 Tobacco use: Secondary | ICD-10-CM

## 2015-02-15 DIAGNOSIS — C7800 Secondary malignant neoplasm of unspecified lung: Secondary | ICD-10-CM

## 2015-02-15 DIAGNOSIS — C20 Malignant neoplasm of rectum: Secondary | ICD-10-CM

## 2015-02-15 DIAGNOSIS — C189 Malignant neoplasm of colon, unspecified: Secondary | ICD-10-CM

## 2015-02-15 DIAGNOSIS — C19 Malignant neoplasm of rectosigmoid junction: Secondary | ICD-10-CM | POA: Diagnosis not present

## 2015-02-15 DIAGNOSIS — N189 Chronic kidney disease, unspecified: Secondary | ICD-10-CM | POA: Diagnosis not present

## 2015-02-15 DIAGNOSIS — R112 Nausea with vomiting, unspecified: Secondary | ICD-10-CM

## 2015-02-15 DIAGNOSIS — D631 Anemia in chronic kidney disease: Secondary | ICD-10-CM

## 2015-02-15 DIAGNOSIS — Z95828 Presence of other vascular implants and grafts: Secondary | ICD-10-CM

## 2015-02-15 LAB — CBC WITH DIFFERENTIAL/PLATELET
BASOS ABS: 0 10*3/uL (ref 0.0–0.1)
BASOS PCT: 0 %
EOS ABS: 0.2 10*3/uL (ref 0.0–0.7)
EOS PCT: 1 %
HCT: 27.7 % — ABNORMAL LOW (ref 36.0–46.0)
Hemoglobin: 8.8 g/dL — ABNORMAL LOW (ref 12.0–15.0)
Lymphocytes Relative: 6 %
Lymphs Abs: 1.3 10*3/uL (ref 0.7–4.0)
MCH: 27.6 pg (ref 26.0–34.0)
MCHC: 31.8 g/dL (ref 30.0–36.0)
MCV: 86.8 fL (ref 78.0–100.0)
MONO ABS: 0.9 10*3/uL (ref 0.1–1.0)
Monocytes Relative: 4 %
Neutro Abs: 19.1 10*3/uL — ABNORMAL HIGH (ref 1.7–7.7)
Neutrophils Relative %: 89 %
PLATELETS: 154 10*3/uL (ref 150–400)
RBC: 3.19 MIL/uL — ABNORMAL LOW (ref 3.87–5.11)
RDW: 15.1 % (ref 11.5–15.5)
WBC Morphology: INCREASED
WBC: 22 10*3/uL — ABNORMAL HIGH (ref 4.0–10.5)

## 2015-02-15 LAB — COMPREHENSIVE METABOLIC PANEL
ALBUMIN: 3.5 g/dL (ref 3.5–5.0)
ALT: 21 U/L (ref 14–54)
ANION GAP: 9 (ref 5–15)
AST: 20 U/L (ref 15–41)
Alkaline Phosphatase: 263 U/L — ABNORMAL HIGH (ref 38–126)
BUN: 45 mg/dL — ABNORMAL HIGH (ref 6–20)
CHLORIDE: 105 mmol/L (ref 101–111)
CO2: 22 mmol/L (ref 22–32)
CREATININE: 1.47 mg/dL — AB (ref 0.44–1.00)
Calcium: 7.8 mg/dL — ABNORMAL LOW (ref 8.9–10.3)
GFR calc non Af Amer: 37 mL/min — ABNORMAL LOW (ref >60)
GFR, EST AFRICAN AMERICAN: 43 mL/min — AB (ref >60)
Glucose, Bld: 209 mg/dL — ABNORMAL HIGH (ref 65–99)
Potassium: 5.1 mmol/L (ref 3.5–5.1)
SODIUM: 136 mmol/L (ref 135–145)
Total Bilirubin: 0.3 mg/dL (ref 0.3–1.2)
Total Protein: 7 g/dL (ref 6.5–8.1)

## 2015-02-15 MED ORDER — SODIUM CHLORIDE 0.9 % IJ SOLN
10.0000 mL | Freq: Once | INTRAMUSCULAR | Status: AC
  Administered 2015-02-15: 10 mL via INTRAVENOUS

## 2015-02-15 MED ORDER — HEPARIN SOD (PORK) LOCK FLUSH 100 UNIT/ML IV SOLN
500.0000 [IU] | Freq: Once | INTRAVENOUS | Status: AC
  Administered 2015-02-15: 500 [IU] via INTRAVENOUS

## 2015-02-15 NOTE — Patient Instructions (Signed)
[place] at [facility] Discharge Instructions  RECOMMENDATIONS MADE BY THE CONSULTANT AND ANY TEST RESULTS WILL BE SENT TO YOUR REFERRING PHYSICIAN.   Exam completed by Dr Barbee Shropshire today Return to see the doctor in 2 weeks  Monthly B12 injections Chemotherapy in 2 weeks Return Friday to have your pump removed. Please call the clinic if you have any questions or concerns  Thank you for choosing [place] at [facility] to provide your oncology and hematology care.  To afford each patient quality time with our provider, please arrive at least 15 minutes before your scheduled appointment time.    You need to re-schedule your appointment should you arrive 10 or more minutes late.  We strive to give you quality time with our providers, and arriving late affects you and other patients whose appointments are after yours.  Also, if you no show three or more times for appointments you may be dismissed from the clinic at the providers discretion.     Again, thank you for choosing [facility].  Our hope is that these requests will decrease the amount of time that you wait before being seen by our physicians.       _____________________________________________________________  Should you have questions after your visit to [facility], please contact our office at [phone] between the hours of 8:30 a.m. and 4:30 p.m.  Voicemails left after 4:30 p.m. will not be returned until the following business day.  For prescription refill requests, have your pharmacy contact our office.

## 2015-02-15 NOTE — Progress Notes (Signed)
HOLD chemo today per Dr.Penland. Recheck labs next week and treat as indicated.  Spoke with Gaspar Skeeters at [place]. Labs reviewed, discussed decision to HOLD chemo today. Instructions sent with patient on discharge.  Patient ambulatory on discharge home with caregiver.

## 2015-02-15 NOTE — Progress Notes (Signed)
Jennifer Bogus, MD [place] [address]  History of Locally advanced Rectal Cancer Recurrent disease with solitary pulmonary metastases, s/p resection  CURRENT THERAPY: FOLFOX adjuvant  INTERVAL HISTORY: [name] 64 y.o. female returns for followup of Locally advanced adenocarcinoma of the rectum, She tolerated combined modality therapy well. Radiation therapy completed on 05/04/2013 which was also the last day she received Xeloda, excellent tolerance, status post APR on 07/26/2013 with marked cytoreduction documented as evidenced by no lymph node involvement. pT3 tumor was found. S/P 6 months of Xeloda at 1000 mg BID 2 weeks on and 1 week off finishing in December 2015.   She has just undergone successful resection of a solitary pulmonary nodule, unfortunately pathology was consistent with recurrent colorectal cancer.  She lives at [facility] off of [address].   [name] is here alone today.  As the appointment begins, her throat is scratchy and congested and she can't speak up. She was given water to assist.  She says she's been throwing up due to the chemo. She says she only throws up every now and then. She says she only throws up after she gets her treatment. She reports that the last time she threw up was after chemo. She says she only throws up one time and they gave her medicine to make her feel better.  She confirms that she ate on Thanksgiving. She did not throw up yesterday, and she did not throw up on Thanksgiving.  She says her hands and feet feel fine.  She says she's not feeling terrible and confirms that she will tell me if she feels terrible. She says she feels good enough today to do her treatment.  She says most of the time she sleeps in the daytime and is up at night, and when asked if she's a night owl, she says "yep" and smiles.  She says that most of the time at night she works on her word puzzles,  and colors.  She is unclear about how often she empties her ostomy bag, but seems to say that she does not have to change her bag every day.  She denies any belly pain. She denies any numbness or tingling in her hands or feet.      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   03/25/2013 - 05/05/2013 Chemotherapy Xeloda 1500 mg BID 5 days/week with radiation therapy.   07/26/2013 Definitive Surgery Dr. Arnoldo Morale- Flexible sigmoidoscopy, abdominoperineal resection, total abdominal hysterectomy with bilateral salpingo-oophorectomy   08/30/2013 - 03/01/2014 Chemotherapy Xeloda 1000 mg BID 14 days on and 7 days off x 6 months   03/08/2014 Imaging CT CAP- Bilateral pulmonary nodules, including an 8 mm left lower lobe nodule. Metastatic disease is a concern. The left lower lobe nodule has progressed since 03/05/2013, when it measured 5 mm.   05/26/2014 Imaging CT Chest- Continued enlargement of the superior segment left lower lobe nodule. Malignancy is likely.   In contrast, the right lower lobe nodule is probably benign.   08/17/2014 Imaging CT- Superior segment left lower lobe pulmonary nodule measures stable since the most recent comparison study, but is again noted to have increased in size when comparing to older studies. Continued close attention will be required as neoplasm remains a con   10/26/2014 Surgery thoracoscopic left  lower lobe superior segmentectomy. Pathology with metastatic adenocarcinoma c/w colonic primary   01/04/2015 -  Chemotherapy FOLFOX   01/09/2015 Imaging CT CAP- Interval wedge resection of metastasis within the left lower lobe. No evidence of new metastatic disease within the chest, abdomen or pelvis.       Past Medical History  Diagnosis Date  . Hypertension   . Diabetes mellitus     years  . Asthma   . Depression   .  Mental retardation     stopped school at 9th grade   . History of recurrent UTIs   . Shortness of breath     with exertion  . Cancer (HCC)     Rectal   . Arthritis   . Gout   . Seizures ([place])     more than 4 yrs since last seizure. UNknown etiology  . Rectal cancer ([place])   . Iron deficiency 04/27/2014  . Chronic renal disease, stage 3, moderately decreased glomerular filtration rate between 30-59 mL/min/1.73 square meter 04/27/2014  . Numbness and tingling in hands     x several months   . GERD (gastroesophageal reflux disease)   . Vitamin B 12 deficiency 02/01/2015    Incidentally found without antibody testing.      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     has No Known Allergies.  [name] not currently have medications on file.  Past Surgical History  Procedure Laterality Date  . Abdominal hysterectomy    . Multiple extractions with alveoloplasty N/A 11/23/2012    Procedure: MULTIPLE EXTRACION 1, 2, 4, 5, 6, 7, 8, 9, 10, 11, 12, 13, 14, 17, 18, 20, 23, 24, 25, 26, 28, 29, 32 WITH ALVEOLOPLASTY, REMOVE BILATERAL TORI;  Surgeon: SGae Bon DDS;  [facility] [facility]  Service: Oral Surgery;  Laterality: N/A;  . Colonoscopy with esophagogastroduodenoscopy  (egd) N/A 01/14/2013    Procedure: COLONOSCOPY WITH ESOPHAGOGASTRODUODENOSCOPY (EGD);  Surgeon: NRogene Houston MD;  [facility];  Service: Endoscopy;  Laterality: N/A;  250-moved to 315 Ann to notify pt  . Colonoscopy N/A 02/04/2013    Procedure: COLONOSCOPY;  Surgeon: NRogene Houston MD;  [facility];  Service: Endoscopy;  Laterality: N/A;  225  . Eus N/A 02/18/2013    Procedure: LOWER ENDOSCOPIC ULTRASOUND (EUS);  Surgeon: DMilus Banister MD;  [facility];  Service: Endoscopy;  Laterality: N/A;  . Flexible sigmoidoscopy N/A 07/26/2013    Procedure: FLEXIBLE SIGMOIDOSCOPY;  Surgeon: MJamesetta So MD;  Location: [facility];  Service: General;  Laterality: N/A;  . Abdominal perineal bowel resection N/A 07/26/2013    Procedure:  ABDOMINAL PERINEAL RESECTION;  Surgeon: MJamesetta So MD;  Location: [facility];  Service: General;  Laterality: N/A;  . Supracervical abdominal hysterectomy N/A 07/26/2013    Procedure: HYSTERECTOMY SUPRACERVICAL ABDOMINAL ;  Surgeon: MJamesetta So MD;  Location: [facility];  Service: General;  Laterality: N/A;  .  Salpingoophorectomy Bilateral 07/26/2013    Procedure: SALPINGO OOPHORECTOMY;  Surgeon: Jamesetta So, MD;  Location: [facility];  Service: General;  Laterality: Bilateral;  . Colostomy Left 07/26/2013    Procedure: COLOSTOMY;  Surgeon: Jamesetta So, MD;  Location: [facility];  Service: General;  Laterality: Left;  . Colonoscopy N/A 05/12/2014    Procedure: COLONOSCOPY;  Surgeon: Rogene Houston, MD;  [facility];  Service: Endoscopy;  Laterality: N/A;  1030  . Colon surgery  2015    bowel resection/w colostomy  . Video assisted thoracoscopy Left 10/26/2014    Procedure: LEFT VIDEO ASSISTED THORACOSCOPY;  Surgeon: [name], MD;  Location: [place];  Service: Thoracic;  Laterality: Left;  . Segmentecomy Left 10/26/2014    Procedure: LEFT LOWER LOBE SUPERIOR SEGMENTECTOMY;  Surgeon: [name], MD;  Location: [place];  Service: Thoracic;  Laterality: Left;  . Lymph node dissection Left 10/26/2014    Procedure: LYMPH NODE DISSECTION;  Surgeon: [name], MD;  Location: [place];  Service: Thoracic;  Laterality: Left;  . Portacath placement Left 01/02/2015    Procedure: INSERTION PORT-A-CATH;  Surgeon: Aviva Signs, MD;  [facility]: [facility];  Service: General;  Laterality: Left;    Denies any headaches, dizziness, double vision, fevers, chills, night sweats, nausea, vomiting, diarrhea, constipation, chest pain, heart palpitations, shortness of breath, blood in stool, black tarry stool, urinary pain, urinary burning, urinary frequency, hematuria. 14 point review of systems was performed and is negative except as detailed under history of present illness and above   PHYSICAL EXAMINATION  ECOG PERFORMANCE STATUS: 1  Filed Vitals:   02/15/15 0954  BP: 186/62  Pulse: 110  Temp: 98.2 F (36.8 C)  Resp: 18    Filed Weights   02/15/15 0954  Weight: 227 lb (102.967 kg)    GENERAL:alert, no distress, well nourished, well developed, comfortable, cooperative, obese, smiling Gets onto the exam table without assistance SKIN: skin color, texture, turgor are normal, no rashes or significant lesions HEAD: Normocephalic, No masses, lesions, tenderness or abnormalities EYES: normal, PERRLA, EOMI, Conjunctiva are pink and non-injected EARS: External ears normal OROPHARYNX:lips, buccal mucosa, and tongue normal and mucous membranes are moist  NECK: supple, no adenopathy, thyroid normal size, non-tender, without nodularity, no stridor, non-tender, trachea midline LYMPH:  no palpable lymphadenopathy BREAST:not examined LUNGS: clear to auscultation  HEART: regular rate & rhythm, no murmurs and no gallops ABDOMEN:abdomen soft, obese and normal bowel sounds BACK: Back symmetric, no curvature., No CVA tenderness EXTREMITIES:less then 2 second capillary refill, no joint deformities, effusion, or inflammation,  no skin discoloration, no clubbing, no cyanosis  NEURO: alert & oriented x 3 with fluent speech, no focal motor/sensory deficits, gait normal   LABORATORY DATA: I have reviewed the results listed below  CBC    Component Value Date/Time   WBC 22.0* 02/15/2015 1000   RBC 3.19* 02/15/2015 1000   RBC 3.84* 11/07/2009 1649   HGB 8.8* 02/15/2015 1000   HCT 27.7* 02/15/2015 1000   PLT 154 02/15/2015 1000   MCV 86.8 02/15/2015 1000   MCH 27.6 02/15/2015 1000   MCHC 31.8 02/15/2015 1000   RDW 15.1 02/15/2015 1000   LYMPHSABS 1.3 02/15/2015 1000   MONOABS 0.9 02/15/2015 1000   EOSABS 0.2 02/15/2015 1000   BASOSABS 0.0 02/15/2015 1000   CMP     Component Value Date/Time   NA 136 02/15/2015 1000   K 5.1 02/15/2015 1000   CL 105 02/15/2015 1000  CO2 22 02/15/2015 1000   GLUCOSE 209* 02/15/2015 1000   BUN 45* 02/15/2015 1000   CREATININE 1.47* 02/15/2015 1000   CREATININE 1.11* 02/08/2013 1451   CALCIUM 7.8* 02/15/2015 1000   PROT 7.0 02/15/2015 1000   ALBUMIN 3.5 02/15/2015 1000   AST 20 02/15/2015 1000   ALT 21 02/15/2015 1000   ALKPHOS 263* 02/15/2015 1000   BILITOT 0.3 02/15/2015 1000   GFRNONAA 37* 02/15/2015 1000   GFRNONAA 54* 02/08/2013 1451   GFRAA 43* 02/15/2015 1000   GFRAA 62 02/08/2013 1451      PATHOLOGY REPORT OF SURGICAL PATHOLOGY FIAL DIAGNOSIS Diagnosis 1. Lung, wedge biopsy/resection, superior segment of left lower lobe - METASTATIC ADENOCARCINOMA, CONSISTENT WITH COLONIC PRIMARY, SPANNING 1.5 CM. - LYMPHOVASCULAR INVASION IS IDENTIFIED. - THE SURGICAL RESECTION MARGINS ARE NEGATIVE FOR CARCINOMA. - SEE COMMENT. 2. Lymph node, biopsy, 9 L - THERE IS NO EVIDENCE OF CARCINOMA IN 1 OF 1 LYMPH NODE (0/1). 3. Lymph node, biopsy, 9 L #2 - THERE IS NO EVIDENCE OF CARCINOMA IN 1 OF 1 LYMPH NODE (0/1). 4. Lymph node, biopsy, 11 L - THERE IS NO EVIDENCE OF CARCINOMA IN 1 OF 1 LYMPH NODE (0/1). 5. Lymph node, biopsy, 5 - THERE IS NO EVIDENCE OF CARCINOMA  IN 1 OF 1 LYMPH NODE (0/1). Microscopic Comment 1. A block will be sent for KRAS testing and the results reported separately. Additional testing can be performed upon clinician request. (JBK:ds 10/27/14) [name], Electronic Signature (Case signed 10/27/2014) Intraoperative    RADIOGRAPHIC STUDIES:  CLINICAL DATA: Subsequent treatment strategy for restaging rectal cancer. Enlarging pulmonary nodule.  EXAM: NUCLEAR MEDICINE PET SKULL BASE TO THIGH COMPARISON: PET of 03/05/2013. Chest CTs of 08/17/14 and 05/26/2014.  IMPRESSION: 1. Hypermetabolism corresponding to the superior segment left lower lobe lung nodule. This could represent a primary bronchogenic carcinoma or isolated metastatic lesion. Primary bronchogenic carcinomas slightly favored. 2. No evidence of hypermetabolic metastatic disease. 3. Left axillary node is small and demonstrates low-level non malignant range hypermetabolism. Favor reactive etiology. 4. Heterogeneous marrow density and metabolism. The CT appearance has been present back to 2010. Favor Paget's disease. An area of more focal hypermetabolism in the left side of the sacrum is nonspecific and without focal CT correlate. Metastatic disease is felt unlikely but cannot be entirely excluded. Recommend attention on follow-up.   Electronically Signed  By: Abigail Miyamoto M.D.  On: 10/07/2014 13:02  ASSESSMENT AND PLAN:  Stage IV CRC with solitary pulmonary nodule, s/p resection History of locally advanced rectal cancer Prior tobacco abuse Pagets disease of bone Anemia CKD CEA was elevated at time of initial diagnosis in 2014, but not with recent recurrence Limited decision making capacity  She has undergone successful surgical resection of the solitary pulmonary nodule. Unfortunately pathology was c/w recurrent CRC. The patient and her caregivers opted to proceed with therapy and she is currently receiving FOLFOX.  She is  alone today but mentions having periodic vomiting. Her weight is overall stable. She is quite anemic today and given her complaints and anemia I am going to hold her chemotherapy X 1 week. She may benefit from renal dosing of aranesp. Her anemia is chronic but worsening with therapy.  Last available mammogram is from 08/16/2013, this will also need to be addressed at follow-up.   She will return in one week for further assessment.   All questions were answered. The patient knows to call the clinic with any problems, questions or concerns. We can certainly  see the patient much sooner if necessary.   This document serves as a record of services personally performed by Ancil Linsey, MD. It was created on her behalf by Toni Amend, a trained medical scribe. The creation of this record is based on the scribe's personal observations and the provider's statements to them. This document has been checked and approved by the attending provider.  I have reviewed the above documentation for accuracy and completeness, and I agree with the above.  This note was electronically signed.  Kelby Fam. Whitney Muse, MD

## 2015-02-15 NOTE — Patient Instructions (Signed)
[place] at [facility] Discharge Instructions  RECOMMENDATIONS MADE BY THE CONSULTANT AND ANY TEST RESULTS WILL BE SENT TO YOUR REFERRING PHYSICIAN.  HOLD chemotherapy today. Some of your blood counts are low. We will reschedule chemotherapy for next week and reschedule all your other chemo appointments. Return as scheduled.  Thank you for choosing [place] at [facility] to provide your oncology and hematology care.  To afford each patient quality time with our provider, please arrive at least 15 minutes before your scheduled appointment time.    You need to re-schedule your appointment should you arrive 10 or more minutes late.  We strive to give you quality time with our providers, and arriving late affects you and other patients whose appointments are after yours.  Also, if you no show three or more times for appointments you may be dismissed from the clinic at the providers discretion.     Again, thank you for choosing [facility].  Our hope is that these requests will decrease the amount of time that you wait before being seen by our physicians.       _____________________________________________________________  Should you have questions after your visit to [facility], please contact our office at [phone] between the hours of 8:30 a.m. and 4:30 p.m.  Voicemails left after 4:30 p.m. will not be returned until the following business day.  For prescription refill requests, have your pharmacy contact our office.

## 2015-02-17 ENCOUNTER — Encounter (HOSPITAL_COMMUNITY): Payer: Medicare Other

## 2015-02-21 NOTE — Progress Notes (Signed)
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  Avastin will be added in near future.  Last cycle (#4) deferred x 1 week due to renal function change and   INTERVAL HISTORY: [name] 64 y.o. female returns for followup of Locally advanced adenocarcinoma of the rectum, She tolerated combined modality therapy well. Radiation therapy completed on 05/04/2013 which was also the last day she received Xeloda, excellent tolerance, status post APR on 07/26/2013 with marked cytoreduction documented as evidenced by no lymph node involvement. pT3 tumor was found. S/P 6 months of Xeloda at 1000 mg BID 2 weeks on and 1 week off finishing in December 2015.  Serial CT imaging demonstrated an enlarging pulmonary nodule resulting in LLL superior segmentectomy by Dr. Roxan Hockey on 10/26/2014 demonstrating oligometastatic disease of rectal adenocarcinoma, resulting in an upstage to Stage IV disease.    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   03/25/2013 - 05/05/2013 Chemotherapy Xeloda 1500 mg BID 5 days/week with radiation therapy.   07/26/2013 Definitive Surgery Dr. Arnoldo Morale- Flexible sigmoidoscopy, abdominoperineal resection, total abdominal hysterectomy with bilateral salpingo-oophorectomy   08/30/2013 - 03/01/2014 Chemotherapy Xeloda 1000 mg BID 14 days on and 7 days off x 6 months   03/08/2014 Imaging CT CAP- Bilateral pulmonary nodules, including an 8 mm left lower lobe nodule. Metastatic disease is a concern. The left lower lobe nodule has progressed since 03/05/2013, when it measured 5 mm.   05/26/2014 Imaging CT Chest- Continued enlargement of the superior segment left lower lobe nodule. Malignancy is likely.   In  contrast, the right lower lobe nodule is probably benign.   08/17/2014 Imaging CT- Superior segment left lower lobe pulmonary nodule measures stable since the most recent comparison study, but is again noted to have increased in size when comparing to older studies. Continued close attention will be required as neoplasm remains a con   10/26/2014 Surgery thoracoscopic left lower lobe superior segmentectomy. Pathology with metastatic adenocarcinoma c/w colonic primary   01/04/2015 -  Chemotherapy FOLFOX+Avastin (Avastin started on 01/18/2015).   01/09/2015 Imaging CT CAP- Interval wedge resection of metastasis within the left lower lobe. No evidence of new metastatic disease within the chest, abdomen or pelvis.   02/15/2015 Treatment Plan Change Treatment deferred due to renal function change (patient poor historian) with N/V.   02/22/2015 Treatment Plan Change Added Aranesp at renal dosing to supportive therpay plan.    I personally reviewed and went over laboratory results with the patient.  The results are noted within this dictation.  Labs will be updated today.  Treatment parameters are met today for treatment.  She had 1 week off from therapy.  She reports that she did well during this time.  She denies any nausea, vomiting, PN symptoms, cold intolerance issues, diarrhea, constipation, etc.  She notes a good Thanksgiving dinner.  She denies any issues today.  Past Medical History  Diagnosis Date  . Hypertension   . Diabetes mellitus     years  . Asthma   . Depression   . Mental retardation     stopped school at 9th grade   . History of recurrent UTIs   . Shortness of breath     with exertion  .  Cancer (HCC)     Rectal   . Arthritis   . Gout   . Seizures ([place])     more than 4 yrs since last seizure. UNknown etiology  . Rectal cancer ([place])   . Iron deficiency 04/27/2014  . Chronic renal disease, stage 3, moderately decreased glomerular filtration rate between 30-59 mL/min/1.73 square  meter 04/27/2014  . Numbness and tingling in hands     x several months   . GERD (gastroesophageal reflux disease)   . Vitamin B 12 deficiency 02/01/2015    Incidentally found without antibody testing.      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     has No Known Allergies.  Current Outpatient Prescriptions on File Prior to Visit  Medication Sig Dispense Refill  . amLODipine (NORVASC) 10 MG tablet Take 1 tablet (10 mg total) by mouth every morning. 30 tablet 1  . aspirin EC 81 MG tablet Take 81 mg by mouth daily.    .Marland Kitchendextrose 5 % SOLN 1,000 mL with fluorouracil 5 GM/100ML SOLN Inject into the vein. Every 14 days over 46 hours    . diphenhydrAMINE (BENADRYL) 25 MG tablet Take 1 tablet (25 mg total) by mouth every 6 (six) hours. (Patient not taking: Reported on 02/01/2015) 14 tablet 0  . docusate sodium (COLACE) 100 MG capsule Take 100 mg by mouth 2 (two) times daily.    . Emollient (CERAVE) LOTN Apply 1 application topically daily. Apply to both feet    . famotidine (PEPCID) 20 MG tablet Take 1 tablet (20 mg total) by mouth 2 (two) times daily. 14 tablet 0  . ferrous sulfate 325 (65 FE) MG tablet TAKE (1) TABLET BY MOUTH TWICE DAILY. 60 tablet 4  . fish oil-omega-3 fatty  acids 1000 MG capsule Take 1 g by mouth 2 (two) times daily.    . Fluticasone-Salmeterol (ADVAIR) 250-50 MCG/DOSE AEPB Inhale 1 puff into the lungs every 12 (twelve) hours.    .Marland Kitchenglucose blood (ACCU-CHEK AVIVA PLUS) test strip 1 each by Other route daily. Use as instructed    . HYDROcodone-acetaminophen (NORCO/VICODIN) 5-325 MG tablet Take 1 tablet by mouth every 4 (four) hours as needed for moderate pain. For pain not relieved by tramadol (Patient not taking: Reported on 01/10/2015) 40 tablet 0  . ibuprofen (ADVIL,MOTRIN) 600 MG tablet Take 600 mg by mouth every 6 (six) hours as needed for moderate pain.    . Insulin Glargine (LANTUS SOLOSTAR) 100 UNIT/ML SOPN Inject 12 Units into the skin at bedtime.     .Marland Kitchenleucovorin 50 MG injection Inject into the vein. Every 14 days    . lidocaine-prilocaine (EMLA) cream Apply a quarter size amount to port site 1 hour prior to chemo. Do not rub in. Cover with plastic wrap. 30 g 3  . lisinopril (PRINIVIL,ZESTRIL) 10 MG tablet Take 1 tablet (10 mg total) by mouth daily. 30 tablet 1  . loperamide (IMODIUM) 2 MG capsule Take 2  caps after 1st loose stool and then 1 cap q2h until 12 hours have passed without having a loose stool. At bedtime, take 2 caps. Then 2 caps q4 h until morning. (Patient not taking: Reported on 02/15/2015) 45 capsule 0  . loratadine-pseudoephedrine (LORATADINE-D 24HR) 10-240 MG per 24 hr tablet Take 1 tablet by mouth daily.    . metFORMIN (GLUCOPHAGE) 1000 MG tablet Take 1,000 mg by mouth 2 (two) times daily with a meal.    . metoprolol succinate (TOPROL-XL) 50 MG 24 hr tablet Take 100 mg by mouth at bedtime.     . montelukast (SINGULAIR) 10 MG tablet Take 10 mg by mouth at bedtime.    . ondansetron (ZOFRAN) 8 MG tablet Take 1 tablet (8 mg total) by mouth every 8 (eight) hours as needed for nausea or vomiting. #1 nausea med to take 30 tablet 2  . OXALIPLATIN IV Inject into the vein every 14 (fourteen) days. Starting 10/19    . pantoprazole  (PROTONIX) 40 MG tablet Take 40 mg by mouth every morning.     . Pegfilgrastim (NEULASTA ONPRO Fossil) Inject into the skin. Every 14 days    . phenytoin (DILANTIN) 100 MG ER capsule Take 100 mg by mouth 2 (two) times daily.    . pravastatin (PRAVACHOL) 40 MG tablet Take 40 mg by mouth every evening.     . predniSONE (DELTASONE) 10 MG tablet Take 4 tablets (40 mg total) by mouth daily. 20 tablet 0  . PROAIR HFA 108 (90 BASE) MCG/ACT inhaler     . prochlorperazine (COMPAZINE) 10 MG tablet Take 1 tablet (10 mg total) by mouth every 6 (six) hours as needed for nausea or vomiting. #2 nausea med to take 30 tablet 2  . silver sulfADIAZINE (SILVADENE) 1 % cream Apply 1 application topically every morning.     . sodium chloride (OCEAN) 0.65 % SOLN nasal spray Place 2 sprays into both nostrils 4 (four) times daily as needed for congestion.    . traMADol (ULTRAM) 50 MG tablet Take 50 mg by mouth every 4 (four) hours as needed for moderate pain.     . traZODone (DESYREL) 50 MG tablet     . triamcinolone cream (KENALOG) 0.1 % Apply 1 application topically 2 (two) times daily. To stoma     Current Facility-Administered Medications on File Prior to Visit  Medication Dose Route Frequency Provider Last Rate Last Dose  . 0.9 %  sodium chloride infusion   Intravenous Continuous Baird Cancer, PA-C   Stopped at 02/22/15 1557  . fluorouracil (ADRUCIL) 5,200 mg in sodium chloride 0.9 % 146 mL chemo infusion  2,400 mg/m2 (Treatment Plan Actual) Intravenous 1 day or 1 dose Patrici Ranks, MD   5,200 mg at 02/22/15 1557  . sodium chloride 0.9 % injection 10 mL  10 mL Intracatheter PRN Patrici Ranks, MD        Past Surgical History  Procedure Laterality Date  . Abdominal hysterectomy    . Multiple extractions with alveoloplasty N/A 11/23/2012    Procedure: MULTIPLE EXTRACION 1, 2, 4, 5, 6, 7, 8, 9, 10, 11, 12, 13, 14, 17, 18, 20, 23, 24, 25, 26, 28, 29, 32 WITH ALVEOLOPLASTY, REMOVE BILATERAL TORI;  Surgeon:  Gae Bon, DDS;  Location: [place];  Service: Oral Surgery;  Laterality: N/A;  . Colonoscopy with esophagogastroduodenoscopy (egd) N/A 01/14/2013    Procedure: COLONOSCOPY WITH ESOPHAGOGASTRODUODENOSCOPY (EGD);  Surgeon: Rogene Houston, MD;  [facility];  Service: Endoscopy;  Laterality: N/A;  250-moved to 315 Ann to notify pt  . Colonoscopy N/A 02/04/2013    Procedure: COLONOSCOPY;  Surgeon: Rogene Houston, MD;  [facility];  Service: Endoscopy;  Laterality: N/A;  225  . Eus N/A 02/18/2013    Procedure: LOWER ENDOSCOPIC ULTRASOUND (EUS);  Surgeon: Milus Banister, MD;  Location[facility];  Service: Endoscopy;  Laterality: N/A;  . Flexible sigmoidoscopy N/A 07/26/2013    Procedure: FLEXIBLE SIGMOIDOSCOPY;  Surgeon: Jamesetta So, MD;  [facility];  Service: General;  Laterality: N/A;  . Abdominal perineal bowel resection N/A 07/26/2013    Procedure:  ABDOMINAL PERINEAL RESECTION;  Surgeon: Jamesetta So, MD;  [facility];  Service: General;  Laterality: N/A;  . Supracervical abdominal hysterectomy N/A 07/26/2013    Procedure: HYSTERECTOMY SUPRACERVICAL ABDOMINAL ;  Surgeon: Jamesetta So, MD;  [facility];  Service: General;  Laterality: N/A;  . Salpingoophorectomy Bilateral 07/26/2013    Procedure: SALPINGO OOPHORECTOMY;  Surgeon: Jamesetta So, MD;  [facility];  Service: General;  Laterality: Bilateral;  . Colostomy Left 07/26/2013    Procedure: COLOSTOMY;  Surgeon: Jamesetta So, MD;  [facility];  Service: General;  Laterality: Left;  . Colonoscopy N/A 05/12/2014    Procedure: COLONOSCOPY;  Surgeon: Rogene Houston, MD;  [facility];  Service: Endoscopy;  Laterality: N/A;  1030  . Colon surgery  2015    bowel resection/w colostomy  . Video assisted thoracoscopy Left 10/26/2014    Procedure: LEFT VIDEO ASSISTED THORACOSCOPY;  Surgeon: [name], MD;  Location: [place];  Service: Thoracic;  Laterality: Left;  .  Segmentecomy Left 10/26/2014    Procedure: LEFT LOWER LOBE SUPERIOR SEGMENTECTOMY;  Surgeon: [name], MD;  Location: [place];  Service: Thoracic;  Laterality: Left;  . Lymph node dissection Left 10/26/2014    Procedure: LYMPH NODE DISSECTION;  Surgeon: [name], MD;  Location: [place];  Service: Thoracic;  Laterality: Left;  . Portacath placement Left 01/02/2015    Procedure: INSERTION PORT-A-CATH;  Surgeon: Aviva Signs, MD;  [facility];  Service: General;  Laterality: Left;    Denies any headaches, dizziness, double vision, fevers, chills, night sweats, vomiting, diarrhea, constipation, chest pain, heart palpitations, shortness of breath, blood in stool, black tarry stool, urinary pain, urinary burning, urinary frequency, hematuria.   PHYSICAL EXAMINATION  ECOG PERFORMANCE STATUS: 2 - Symptomatic, <50% confined to bed  There were no vitals filed for this visit.  GENERAL:alert, no distress, well nourished, comfortable, cooperative, obese, smiling and unaccompanied today in a chemo-recliner SKIN: skin color, texture, turgor are normal. HEAD: Normocephalic, No masses, lesions, tenderness or abnormalities EYES: normal, PERRLA, EOMI, Conjunctiva are pink and non-injected EARS: External ears normal OROPHARYNX:lips, buccal mucosa, and tongue normal and mucous membranes are moist  NECK: supple, no adenopathy, trachea midline LYMPH:  no palpable lymphadenopathy BREAST:not examined LUNGS: clear to auscultation  HEART: regular rate & rhythm ABDOMEN:abdomen soft, non-tender, obese and normal bowel sounds BACK: Back symmetric, no curvature., No CVA tenderness EXTREMITIES:less then 2 second capillary refill, no joint deformities, effusion, or inflammation, no skin discoloration, no cyanosis  NEURO: no focal motor/sensory deficits, gait normal   LABORATORY DATA: CBC    Component Value Date/Time   WBC 11.7* 02/22/2015 1019   RBC 3.19* 02/22/2015 1019   RBC 3.84*  11/07/2009 1649   HGB 8.9* 02/22/2015 1019   HCT 27.8* 02/22/2015 1019   PLT  167 02/22/2015 1019   MCV 87.1 02/22/2015 1019   MCH 27.9 02/22/2015 1019   MCHC 32.0 02/22/2015 1019   RDW 16.1* 02/22/2015 1019   LYMPHSABS 1.1 02/22/2015 1019   MONOABS 0.8 02/22/2015 1019   EOSABS 0.1 02/22/2015 1019   BASOSABS 0.1 02/22/2015 1019      Chemistry      Component Value Date/Time   NA 136 02/22/2015 1019   K 5.1 02/22/2015 1019   CL 105 02/22/2015 1019   CO2 21* 02/22/2015 1019   BUN 32* 02/22/2015 1019   CREATININE 1.39* 02/22/2015 1019   CREATININE 1.11* 02/08/2013 1451      Component Value Date/Time   CALCIUM 8.1* 02/22/2015 1019   ALKPHOS 220* 02/22/2015 1019   AST 19 02/22/2015 1019   ALT 16 02/22/2015 1019   BILITOT 0.4 02/22/2015 1019      Lab Results  Component Value Date   CEA 4.5 01/04/2015     PENDING LABS:   RADIOGRAPHIC STUDIES:  No results found.   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 Roxan Hockey on 10/26/2014 demonstrating recurrent Stage IV disease after undergoing Xeloda+XRT finishing on 05/04/2013, followed by APR on 07/26/2013 by Dr. Arnoldo Morale with marked cytoreduction documented, followed by 6 months worth of Xeloda 1000 mg BID 2 weeks on and 1 week off x 6 months.  Surveillance per NCCN guidelines was followed with serial CT imaging of chest demonstrating an enlarging pulmonary nodule as mentioned previously.  Now on systemic chemotherapy consisting of FOLFOX + Avastin beginning on 01/04/2015.  Oncology history is up-to-date.  She reports that her week off from chemotherapy was good.  She denies any nausea/vomiting over the past week.  She denies any PN symptoms.  She denies any issues with cold intolerance.  She reports that she had a nice Thanksgiving.  Pre-chemo labs today.  Treatment parameters are met  today and therefore, treatment and antibody plan are signed today.  She is due for B12 injection in 1 week.  We will coordinate with her pump D/C in 2 days or with her next cycle.  Due to her progressive anemia secondary to chronic renal disease, I have built an Aranesp supportive therapy plan and this will start today.  Dosing is 0.75 mcg/kg.  I have requested 1 L of NS be given today.  Return in 2 weeks for follow-up.     THERAPY PLAN:  Continue with adjuvant FOLFOX + Avastin.    All questions were answered. The patient knows to call the clinic with any problems, questions or concerns. We can certainly see the patient much sooner if necessary.  Patient and plan discussed with Dr. Ancil Linsey and she is in agreement with the aforementioned.   This note is electronically signed by: Doy Mince 02/22/2015 5:23 PM

## 2015-02-21 NOTE — Assessment & Plan Note (Addendum)
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 Roxan Hockey on 10/26/2014 demonstrating recurrent Stage IV disease after undergoing Xeloda+XRT finishing on 05/04/2013, followed by APR on 07/26/2013 by Dr. Arnoldo Morale with marked cytoreduction documented, followed by 6 months worth of Xeloda 1000 mg BID 2 weeks on and 1 week off x 6 months.  Surveillance per NCCN guidelines was followed with serial CT imaging of chest demonstrating an enlarging pulmonary nodule as mentioned previously.  Now on systemic chemotherapy consisting of FOLFOX + Avastin beginning on 01/04/2015.  Oncology history is up-to-date.  She reports that her week off from chemotherapy was good.  She denies any nausea/vomiting over the past week.  She denies any PN symptoms.  She denies any issues with cold intolerance.  She reports that she had a nice Thanksgiving.  Pre-chemo labs today.  Treatment parameters are met today and therefore, treatment and antibody plan are signed today.  She is due for B12 injection in 1 week.  We will coordinate with her pump D/C in 2 days or with her next cycle.  Due to her progressive anemia secondary to chronic renal disease, I have built an Aranesp supportive therapy plan and this will start today.  Dosing is 0.75 mcg/kg.  I have requested 1 L of NS be given today.  Return in 2 weeks for follow-up.

## 2015-02-22 ENCOUNTER — Encounter (HOSPITAL_BASED_OUTPATIENT_CLINIC_OR_DEPARTMENT_OTHER): Payer: Medicare Other | Admitting: Oncology

## 2015-02-22 ENCOUNTER — Encounter (HOSPITAL_COMMUNITY): Payer: Medicare Other | Attending: Hematology and Oncology

## 2015-02-22 VITALS — BP 162/72 | HR 98 | Temp 98.0°F | Resp 16 | Wt 226.4 lb

## 2015-02-22 DIAGNOSIS — C20 Malignant neoplasm of rectum: Secondary | ICD-10-CM

## 2015-02-22 DIAGNOSIS — E538 Deficiency of other specified B group vitamins: Secondary | ICD-10-CM

## 2015-02-22 DIAGNOSIS — Z5112 Encounter for antineoplastic immunotherapy: Secondary | ICD-10-CM | POA: Diagnosis not present

## 2015-02-22 DIAGNOSIS — Z5111 Encounter for antineoplastic chemotherapy: Secondary | ICD-10-CM | POA: Diagnosis not present

## 2015-02-22 DIAGNOSIS — N189 Chronic kidney disease, unspecified: Secondary | ICD-10-CM | POA: Diagnosis not present

## 2015-02-22 DIAGNOSIS — D649 Anemia, unspecified: Secondary | ICD-10-CM | POA: Insufficient documentation

## 2015-02-22 DIAGNOSIS — C7800 Secondary malignant neoplasm of unspecified lung: Secondary | ICD-10-CM

## 2015-02-22 DIAGNOSIS — R911 Solitary pulmonary nodule: Secondary | ICD-10-CM

## 2015-02-22 LAB — CBC WITH DIFFERENTIAL/PLATELET
Basophils Absolute: 0.1 10*3/uL (ref 0.0–0.1)
Basophils Relative: 0 %
EOS PCT: 1 %
Eosinophils Absolute: 0.1 10*3/uL (ref 0.0–0.7)
HEMATOCRIT: 27.8 % — AB (ref 36.0–46.0)
Hemoglobin: 8.9 g/dL — ABNORMAL LOW (ref 12.0–15.0)
LYMPHS PCT: 9 %
Lymphs Abs: 1.1 10*3/uL (ref 0.7–4.0)
MCH: 27.9 pg (ref 26.0–34.0)
MCHC: 32 g/dL (ref 30.0–36.0)
MCV: 87.1 fL (ref 78.0–100.0)
MONO ABS: 0.8 10*3/uL (ref 0.1–1.0)
Monocytes Relative: 7 %
NEUTROS ABS: 9.6 10*3/uL — AB (ref 1.7–7.7)
NEUTROS PCT: 83 %
PLATELETS: 167 10*3/uL (ref 150–400)
RBC: 3.19 MIL/uL — AB (ref 3.87–5.11)
RDW: 16.1 % — AB (ref 11.5–15.5)
WBC: 11.7 10*3/uL — AB (ref 4.0–10.5)

## 2015-02-22 LAB — COMPREHENSIVE METABOLIC PANEL
ALK PHOS: 220 U/L — AB (ref 38–126)
ALT: 16 U/L (ref 14–54)
ANION GAP: 10 (ref 5–15)
AST: 19 U/L (ref 15–41)
Albumin: 3.5 g/dL (ref 3.5–5.0)
BILIRUBIN TOTAL: 0.4 mg/dL (ref 0.3–1.2)
BUN: 32 mg/dL — ABNORMAL HIGH (ref 6–20)
CALCIUM: 8.1 mg/dL — AB (ref 8.9–10.3)
CO2: 21 mmol/L — ABNORMAL LOW (ref 22–32)
Chloride: 105 mmol/L (ref 101–111)
Creatinine, Ser: 1.39 mg/dL — ABNORMAL HIGH (ref 0.44–1.00)
GFR, EST AFRICAN AMERICAN: 46 mL/min — AB (ref >60)
GFR, EST NON AFRICAN AMERICAN: 39 mL/min — AB (ref >60)
Glucose, Bld: 173 mg/dL — ABNORMAL HIGH (ref 65–99)
Potassium: 5.1 mmol/L (ref 3.5–5.1)
SODIUM: 136 mmol/L (ref 135–145)
TOTAL PROTEIN: 6.8 g/dL (ref 6.5–8.1)

## 2015-02-22 LAB — URINALYSIS, DIPSTICK ONLY
BILIRUBIN URINE: NEGATIVE
Glucose, UA: NEGATIVE mg/dL
KETONES UR: NEGATIVE mg/dL
NITRITE: NEGATIVE
PROTEIN: NEGATIVE mg/dL
Specific Gravity, Urine: 1.015 (ref 1.005–1.030)
pH: 5 (ref 5.0–8.0)

## 2015-02-22 MED ORDER — FLUOROURACIL CHEMO INJECTION 5 GM/100ML
2400.0000 mg/m2 | INTRAVENOUS | Status: DC
  Administered 2015-02-22: 5200 mg via INTRAVENOUS
  Filled 2015-02-22: qty 104

## 2015-02-22 MED ORDER — OXALIPLATIN CHEMO INJECTION 100 MG/20ML
85.0000 mg/m2 | Freq: Once | INTRAVENOUS | Status: AC
  Administered 2015-02-22: 185 mg via INTRAVENOUS
  Filled 2015-02-22: qty 37

## 2015-02-22 MED ORDER — FLUOROURACIL CHEMO INJECTION 2.5 GM/50ML
400.0000 mg/m2 | Freq: Once | INTRAVENOUS | Status: AC
  Administered 2015-02-22: 850 mg via INTRAVENOUS
  Filled 2015-02-22: qty 17

## 2015-02-22 MED ORDER — SODIUM CHLORIDE 0.9 % IV SOLN
Freq: Once | INTRAVENOUS | Status: AC
  Administered 2015-02-22: 13:00:00 via INTRAVENOUS
  Filled 2015-02-22: qty 4

## 2015-02-22 MED ORDER — SODIUM CHLORIDE 0.9 % IJ SOLN: 10.0000 mL | INTRAMUSCULAR | Status: DC | PRN

## 2015-02-22 MED ORDER — SODIUM CHLORIDE 0.9 % IV SOLN
5.0000 mg/kg | Freq: Once | INTRAVENOUS | Status: AC
  Administered 2015-02-22: 525 mg via INTRAVENOUS
  Filled 2015-02-22: qty 21

## 2015-02-22 MED ORDER — DARBEPOETIN ALFA 100 MCG/0.5ML IJ SOSY
80.0000 ug | PREFILLED_SYRINGE | Freq: Once | INTRAMUSCULAR | Status: AC
  Administered 2015-02-22: 80 ug via SUBCUTANEOUS
  Filled 2015-02-22: qty 0.5

## 2015-02-22 MED ORDER — LEUCOVORIN CALCIUM INJECTION 350 MG
400.0000 mg/m2 | Freq: Once | INTRAMUSCULAR | Status: AC
  Administered 2015-02-22: 868 mg via INTRAVENOUS
  Filled 2015-02-22: qty 43.4

## 2015-02-22 MED ORDER — DEXTROSE 5 % IV SOLN
Freq: Once | INTRAVENOUS | Status: AC
  Administered 2015-02-22: 11:00:00 via INTRAVENOUS

## 2015-02-22 MED ORDER — SODIUM CHLORIDE 0.9 % IV SOLN
INTRAVENOUS | Status: DC
  Administered 2015-02-22: 11:00:00 via INTRAVENOUS

## 2015-02-22 NOTE — Patient Instructions (Signed)
..  [place] at [facility] Discharge Instructions  RECOMMENDATIONS MADE BY THE CONSULTANT AND ANY TEST RESULTS WILL BE SENT TO YOUR REFERRING PHYSICIAN.  Treatment today Aranesp today and every 2 weeks 1 liter of normal saline @ 333 cc's an hour Return in 2 weeks   Thank you for choosing [place] at [facility] to provide your oncology and hematology care.  To afford each patient quality time with our provider, please arrive at least 15 minutes before your scheduled appointment time.    You need to re-schedule your appointment should you arrive 10 or more minutes late.  We strive to give you quality time with our providers, and arriving late affects you and other patients whose appointments are after yours.  Also, if you no show three or more times for appointments you may be dismissed from the clinic at the providers discretion.     Again, thank you for choosing [facility].  Our hope is that these requests will decrease the amount of time that you wait before being seen by our physicians.       _____________________________________________________________  Should you have questions after your visit to [facility], please contact our office at [phone] between the hours of 8:30 a.m. and 4:30 p.m.  Voicemails left after 4:30 p.m. will not be returned until the following business day.  For prescription refill requests, have your pharmacy contact our office.

## 2015-02-22 NOTE — Patient Instructions (Signed)
[facility] Discharge Instructions for Patients Receiving Chemotherapy  Today you received the following chemotherapy agents: 45f, leucovorin, oxaliplatin.     If you develop nausea and vomiting, or diarrhea that is not controlled by your medication, call the clinic.  The clinic phone number is [phone] Office hours are Monday-Friday 8:30am-5:00pm.  BELOW ARE SYMPTOMS THAT SHOULD BE REPORTED IMMEDIATELY:  *FEVER GREATER THAN 101.0 F  *CHILLS WITH OR WITHOUT FEVER  NAUSEA AND VOMITING THAT IS NOT CONTROLLED WITH YOUR NAUSEA MEDICATION  *UNUSUAL SHORTNESS OF BREATH  *UNUSUAL BRUISING OR BLEEDING  TENDERNESS IN MOUTH AND THROAT WITH OR WITHOUT PRESENCE OF ULCERS  *URINARY PROBLEMS  *BOWEL PROBLEMS  UNUSUAL RASH Items with * indicate a potential emergency and should be followed up as soon as possible. If you have an emergency after office hours please contact your primary care physician or go to the nearest emergency department.  Please call the clinic during office hours if you have any questions or concerns.   You may also contact the Patient Navigator at [phone] you have any questions or need assistance in obtaining follow up care.

## 2015-02-24 ENCOUNTER — Encounter (HOSPITAL_COMMUNITY): Payer: Self-pay

## 2015-02-24 ENCOUNTER — Encounter (HOSPITAL_BASED_OUTPATIENT_CLINIC_OR_DEPARTMENT_OTHER): Payer: Medicare Other

## 2015-02-24 VITALS — BP 165/68 | HR 97 | Temp 98.8°F | Resp 18

## 2015-02-24 DIAGNOSIS — C20 Malignant neoplasm of rectum: Secondary | ICD-10-CM

## 2015-02-24 DIAGNOSIS — C7800 Secondary malignant neoplasm of unspecified lung: Secondary | ICD-10-CM | POA: Diagnosis not present

## 2015-02-24 DIAGNOSIS — Z5189 Encounter for other specified aftercare: Secondary | ICD-10-CM

## 2015-02-24 DIAGNOSIS — R911 Solitary pulmonary nodule: Secondary | ICD-10-CM

## 2015-02-24 MED ORDER — PEGFILGRASTIM 6 MG/0.6ML ~~LOC~~ PSKT
PREFILLED_SYRINGE | SUBCUTANEOUS | Status: AC
  Filled 2015-02-24: qty 0.6

## 2015-02-24 MED ORDER — SODIUM CHLORIDE 0.9 % IJ SOLN
10.0000 mL | INTRAMUSCULAR | Status: DC | PRN
  Administered 2015-02-24: 10 mL
  Filled 2015-02-24: qty 10

## 2015-02-24 MED ORDER — HEPARIN SOD (PORK) LOCK FLUSH 100 UNIT/ML IV SOLN
INTRAVENOUS | Status: AC
  Filled 2015-02-24: qty 5

## 2015-02-24 MED ORDER — PEGFILGRASTIM 6 MG/0.6ML ~~LOC~~ PSKT
6.0000 mg | PREFILLED_SYRINGE | Freq: Once | SUBCUTANEOUS | Status: AC
  Administered 2015-02-24: 6 mg via SUBCUTANEOUS

## 2015-02-24 MED ORDER — HEPARIN SOD (PORK) LOCK FLUSH 100 UNIT/ML IV SOLN
500.0000 [IU] | Freq: Once | INTRAVENOUS | Status: AC | PRN
  Administered 2015-02-24: 500 [IU]

## 2015-02-24 NOTE — Progress Notes (Signed)
[name] presents to have home infusion pump d/c'd and for port-a-cath flush/deaccess.  Good blood return present. Portacath flushed with NS and 500U/8m Heparin, and needle removed intact.  Procedure tolerated well and without incident.

## 2015-02-24 NOTE — Patient Instructions (Signed)
[place] at [facility] Discharge Instructions  RECOMMENDATIONS MADE BY THE CONSULTANT AND ANY TEST RESULTS WILL BE SENT TO YOUR REFERRING PHYSICIAN.  Home infusion pump removal and port flush/deaccess today. Return as scheduled for chemotherapy and office visit.   Thank you for choosing [place] at [facility] to provide your oncology and hematology care.  To afford each patient quality time with our provider, please arrive at least 15 minutes before your scheduled appointment time.    You need to re-schedule your appointment should you arrive 10 or more minutes late.  We strive to give you quality time with our providers, and arriving late affects you and other patients whose appointments are after yours.  Also, if you no show three or more times for appointments you may be dismissed from the clinic at the providers discretion.     Again, thank you for choosing [facility].  Our hope is that these requests will decrease the amount of time that you wait before being seen by our physicians.       _____________________________________________________________  Should you have questions after your visit to [facility], please contact our office at [phone] between the hours of 8:30 a.m. and 4:30 p.m.  Voicemails left after 4:30 p.m. will not be returned until the following business day.  For prescription refill requests, have your pharmacy contact our office.

## 2015-03-01 ENCOUNTER — Inpatient Hospital Stay (HOSPITAL_COMMUNITY): Payer: Medicare Other

## 2015-03-01 ENCOUNTER — Ambulatory Visit (HOSPITAL_COMMUNITY): Payer: Medicare Other | Admitting: Oncology

## 2015-03-01 ENCOUNTER — Ambulatory Visit (HOSPITAL_COMMUNITY): Payer: Medicare Other | Admitting: Hematology & Oncology

## 2015-03-03 ENCOUNTER — Encounter (HOSPITAL_COMMUNITY): Payer: Medicare Other

## 2015-03-03 ENCOUNTER — Encounter (HOSPITAL_BASED_OUTPATIENT_CLINIC_OR_DEPARTMENT_OTHER): Payer: Medicare Other

## 2015-03-03 VITALS — BP 169/56 | HR 99 | Temp 97.5°F | Resp 18

## 2015-03-03 DIAGNOSIS — E538 Deficiency of other specified B group vitamins: Secondary | ICD-10-CM | POA: Diagnosis not present

## 2015-03-03 MED ORDER — CYANOCOBALAMIN 1000 MCG/ML IJ SOLN
1000.0000 ug | Freq: Once | INTRAMUSCULAR | Status: AC
Start: 2015-03-03 — End: 2015-03-03
  Administered 2015-03-03: 1000 ug via INTRAMUSCULAR

## 2015-03-03 NOTE — Patient Instructions (Signed)
[facility] at [facility] Discharge Instructions  RECOMMENDATIONS MADE BY THE CONSULTANT AND ANY TEST RESULTS WILL BE SENT TO YOUR REFERRING PHYSICIAN.  Vitamin B12 1000 mcg injection given as ordered. Return as scheduled.  Thank you for choosing [facility] at [facility] to provide your oncology and hematology care.  To afford each patient quality time with our provider, please arrive at least 15 minutes before your scheduled appointment time.    You need to re-schedule your appointment should you arrive 10 or more minutes late.  We strive to give you quality time with our providers, and arriving late affects you and other patients whose appointments are after yours.  Also, if you no show three or more times for appointments you may be dismissed from the clinic at the providers discretion.     Again, thank you for choosing [facility].  Our hope is that these requests will decrease the amount of time that you wait before being seen by our physicians.       _____________________________________________________________  Should you have questions after your visit to [facility], please contact our office at [phone] between the hours of 8:30 a.m. and 4:30 p.m.  Voicemails left after 4:30 p.m. will not be returned until the following business day.  For prescription refill requests, have your pharmacy contact our office.    

## 2015-03-03 NOTE — Progress Notes (Signed)
[name] presents today for injection per MD orders. B12 1000 mcg administered IM in left Upper Arm. Administration without incident. Patient tolerated well.

## 2015-03-07 NOTE — Assessment & Plan Note (Signed)
Started on Aranesp renal dose every 2 weeks.  Ferritin added to labs today.  Standing order for ferritin is placed and should be done every 4-6 weeks to confirm adequate iron stores.

## 2015-03-07 NOTE — Progress Notes (Signed)
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  Aranesp every 14 days at renal dose.   INTERVAL HISTORY: [name] 64 y.o. female returns for followup of Locally advanced adenocarcinoma of the rectum, She tolerated combined modality therapy well. Radiation therapy completed on 05/04/2013 which was also the last day she received Xeloda, excellent tolerance, status post APR on 07/26/2013 with marked cytoreduction documented as evidenced by no lymph node involvement. pT3 tumor was found. S/P 6 months of Xeloda at 1000 mg BID 2 weeks on and 1 week off finishing in December 2015.  Serial CT imaging demonstrated an enlarging pulmonary nodule resulting in LLL superior segmentectomy by Dr. Roxan Hockey on 10/26/2014 demonstrating oligometastatic disease of rectal adenocarcinoma, resulting in an upstage to Stage IV disease.    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   03/25/2013 - 05/05/2013 Chemotherapy Xeloda 1500 mg BID 5 days/week with radiation therapy.   07/26/2013 Definitive Surgery Dr. Arnoldo Morale- Flexible sigmoidoscopy, abdominoperineal resection, total abdominal hysterectomy with bilateral salpingo-oophorectomy   08/30/2013 - 03/01/2014 Chemotherapy Xeloda 1000 mg BID 14 days on and 7 days off x 6 months   03/08/2014 Imaging CT CAP- Bilateral pulmonary nodules, including an 8 mm left lower lobe nodule. Metastatic disease is a concern. The left lower lobe nodule has progressed since 03/05/2013, when it measured 5 mm.   05/26/2014 Imaging CT  Chest- Continued enlargement of the superior segment left lower lobe nodule. Malignancy is likely.   In contrast, the right lower lobe nodule is probably benign.   08/17/2014 Imaging CT- Superior segment left lower lobe pulmonary nodule measures stable since the most recent comparison study, but is again noted to have increased in size when comparing to older studies. Continued close attention will be required as neoplasm remains a con   10/26/2014 Surgery thoracoscopic left lower lobe superior segmentectomy. Pathology with metastatic adenocarcinoma c/w colonic primary   01/04/2015 -  Chemotherapy FOLFOX+Avastin (Avastin started on 01/18/2015).   01/09/2015 Imaging CT CAP- Interval wedge resection of metastasis within the left lower lobe. No evidence of new metastatic disease within the chest, abdomen or pelvis.   02/15/2015 Treatment Plan Change Treatment deferred due to renal function change (patient poor historian) with N/V.   02/22/2015 Treatment Plan Change Added Aranesp at renal dosing to supportive therpay plan.   03/08/2015 Treatment Plan Change Defer treatment x 1 week    I personally reviewed and went over laboratory results with the patient.  The results are noted within this dictation.  Labs will be updated today.   Labs meet treatment parameters today.  Caregiver reports to the nurse that Dr. Luan Pulling treated the patient for a URI on 12/12 with antibiotic (Keflex), prednisone, and nebulizer treatments.  She has completed her course of antibiotic.  The patient denies any complaints other that an intermittent cough productive of clear sputum.  She denies any fevers.  She denies any yellow/green sputum production.  She denies any nasal complaints.  She denies a sore throat.    I am hesitant to treat the patient  today based upon her recent history as noted above.  Her WBC is WNL, but LLN.  Will defer x 1 week.  Past Medical History  Diagnosis Date  . Hypertension   . Diabetes mellitus      years  . Asthma   . Depression   . Mental retardation     stopped school at 9th grade   . History of recurrent UTIs   . Shortness of breath     with exertion  . Cancer (HCC)     Rectal   . Arthritis   . Gout   . Seizures ([place])     more than 4 yrs since last seizure. UNknown etiology  . Rectal cancer ([place])   . Iron deficiency 04/27/2014  . Chronic renal disease, stage 3, moderately decreased glomerular filtration rate between 30-59 mL/min/1.73 square meter 04/27/2014  . Numbness and tingling in hands     x several months   . GERD (gastroesophageal reflux disease)   . Vitamin B 12 deficiency 02/01/2015    Incidentally found without antibody testing.      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     has No Known Allergies.  Current Outpatient Prescriptions on File Prior to Visit  Medication Sig Dispense Refill  . amLODipine (NORVASC) 10 MG tablet Take 1 tablet (10 mg total) by mouth every morning. 30 tablet 1  . aspirin EC 81 MG tablet Take 81 mg by mouth daily.    .Marland Kitchendextrose 5 % SOLN 1,000 mL with fluorouracil 5 GM/100ML SOLN Inject into the vein. Every 14 days over 46 hours    . docusate sodium (COLACE) 100 MG capsule Take 100 mg by mouth 2 (two) times daily.    .  Emollient (CERAVE) LOTN Apply 1 application topically daily. Apply to both feet    . famotidine (PEPCID) 20 MG tablet Take 1 tablet (20 mg total) by mouth 2 (two) times daily. 14 tablet 0  . ferrous sulfate 325 (65 FE) MG tablet TAKE (1) TABLET BY MOUTH TWICE DAILY. 60 tablet 4  . fish oil-omega-3 fatty acids 1000 MG capsule Take 1 g by mouth 2 (two) times daily.    . Fluticasone-Salmeterol (ADVAIR) 250-50 MCG/DOSE AEPB Inhale 1 puff into the lungs every 12 (twelve) hours.    .Marland Kitchenglucose blood (ACCU-CHEK AVIVA PLUS) test strip 1 each by Other route daily. Use as instructed    . ibuprofen (ADVIL,MOTRIN) 600 MG tablet Take 600 mg by mouth every 6 (six) hours as needed for moderate pain.    . Insulin Glargine (LANTUS SOLOSTAR) 100 UNIT/ML SOPN Inject 12 Units into the skin at bedtime.     .Marland Kitchenleucovorin 50 MG injection Inject into the vein. Every 14 days    . lidocaine-prilocaine (EMLA) cream Apply a quarter size amount to port site 1 hour prior to chemo. Do not rub in. Cover with plastic wrap. 30 g 3  . lisinopril (PRINIVIL,ZESTRIL) 10 MG tablet Take  1 tablet (10 mg total) by mouth daily. 30 tablet 1  . loratadine-pseudoephedrine (LORATADINE-D 24HR) 10-240 MG per 24 hr tablet Take 1 tablet by mouth daily.    . metFORMIN (GLUCOPHAGE) 1000 MG tablet Take 1,000 mg by mouth 2 (two) times daily with a meal.    . metoprolol succinate (TOPROL-XL) 50 MG 24 hr tablet Take 100 mg by mouth at bedtime.     . montelukast (SINGULAIR) 10 MG tablet Take 10 mg by mouth at bedtime.    . ondansetron (ZOFRAN) 8 MG tablet Take 1 tablet (8 mg total) by mouth every 8 (eight) hours as needed for nausea or vomiting. #1 nausea med to take 30 tablet 2  . OXALIPLATIN IV Inject into the vein every 14 (fourteen) days. Starting 10/19    . pantoprazole (PROTONIX) 40 MG tablet Take 40 mg by mouth every morning.     . Pegfilgrastim (NEULASTA ONPRO [place]) Inject into the skin. Every 14 days    . phenytoin (DILANTIN) 100 MG ER capsule Take  100 mg by mouth 2 (two) times daily.    . pravastatin (PRAVACHOL) 40 MG tablet Take 40 mg by mouth every evening.     [name] PROAIR HFA 108 (90 BASE) MCG/ACT inhaler     . diphenhydrAMINE (BENADRYL) 25 MG tablet Take 1 tablet (25 mg total) by mouth every 6 (six) hours. (Patient not taking: Reported on 02/01/2015) 14 tablet 0  . HYDROcodone-acetaminophen (NORCO/VICODIN) 5-325 MG tablet Take 1 tablet by mouth every 4 (four) hours as needed for moderate pain. For pain not relieved by tramadol (Patient not taking: Reported on 01/10/2015) 40 tablet 0  . loperamide (IMODIUM) 2 MG capsule Take 2 caps after 1st loose stool and then 1 cap q2h until 12 hours have passed without having a loose stool. At bedtime, take 2 caps. Then 2 caps q4 h until morning. (Patient not taking: Reported on 02/15/2015) 45 capsule 0  . prochlorperazine (COMPAZINE) 10 MG tablet Take 1 tablet (10 mg total) by mouth every 6 (six) hours as needed for nausea or vomiting. #2 nausea med to take (Patient not taking: Reported on 03/08/2015) 30 tablet 2  . silver sulfADIAZINE (SILVADENE) 1 % cream Apply 1 application topically every morning.     . sodium chloride (OCEAN) 0.65 % SOLN nasal spray Place 2 sprays into both nostrils 4 (four) times daily as needed for congestion.    . traMADol (ULTRAM) 50 MG tablet Take 50 mg by mouth every 4 (four) hours as needed for moderate pain.     . traZODone (DESYREL) 50 MG tablet     . triamcinolone cream (KENALOG) 0.1 % Apply 1 application topically 2 (two) times daily. To stoma     No current facility-administered medications on file prior to visit.    Past Surgical History  Procedure Laterality Date  . Abdominal hysterectomy    . Multiple extractions with alveoloplasty N/A 11/23/2012    Procedure: MULTIPLE EXTRACION 1, 2, 4, 5, 6, 7, 8, 9, 10, 11, 12, 13, 14, 17, 18, 20, 23, 24, 25, 26, 28, 29, 32 WITH ALVEOLOPLASTY, REMOVE BILATERAL TORI;  Surgeon: Gae Bon, DDS;  Location: [place];  Service: Oral  Surgery;  Laterality: N/A;  . Colonoscopy with esophagogastroduodenoscopy (egd) N/A 01/14/2013    Procedure: COLONOSCOPY WITH ESOPHAGOGASTRODUODENOSCOPY (EGD);  Surgeon: Rogene Houston, MD;  [facility];  Service: Endoscopy;  Laterality: N/A;  250-moved to 315 Ann to notify pt  . Colonoscopy N/A 02/04/2013  Procedure: COLONOSCOPY;  Surgeon: Rogene Houston, MD;  [facility];  Service: Endoscopy;  Laterality: N/A;  225  . Eus N/A 02/18/2013    Procedure: LOWER ENDOSCOPIC ULTRASOUND (EUS);  Surgeon: Milus Banister, MD;  [facility];  Service: Endoscopy;  Laterality: N/A;  . Flexible sigmoidoscopy N/A 07/26/2013    Procedure: FLEXIBLE SIGMOIDOSCOPY;  Surgeon: Jamesetta So, MD;  [facility];  Service: General;  Laterality: N/A;  . Abdominal perineal bowel resection N/A 07/26/2013    Procedure:  ABDOMINAL PERINEAL RESECTION;  Surgeon: Jamesetta So, MD;  [facility];  Service: General;  Laterality: N/A;  . Supracervical abdominal hysterectomy N/A 07/26/2013    Procedure: HYSTERECTOMY SUPRACERVICAL ABDOMINAL ;  Surgeon: Jamesetta So, MD;  [facility];  Service: General;  Laterality: N/A;  . Salpingoophorectomy Bilateral 07/26/2013    Procedure: SALPINGO OOPHORECTOMY;  Surgeon: Jamesetta So, MD;  [facility];  Service: General;  Laterality: Bilateral;  . Colostomy Left 07/26/2013    Procedure: COLOSTOMY;  Surgeon: Jamesetta So, MD;  [facility];  Service: General;  Laterality: Left;  . Colonoscopy N/A 05/12/2014    Procedure: COLONOSCOPY;  Surgeon: Rogene Houston, MD;  [facility];  Service: Endoscopy;  Laterality: N/A;  1030  . Colon surgery  2015    bowel resection/w colostomy  . Video assisted thoracoscopy Left 10/26/2014    Procedure: LEFT VIDEO ASSISTED THORACOSCOPY;  Surgeon: [name], MD;  Location: [place];  Service: Thoracic;  Laterality: Left;  . Segmentecomy Left 10/26/2014    Procedure: LEFT LOWER LOBE  SUPERIOR SEGMENTECTOMY;  Surgeon: [name], MD;  Location: [place];  Service: Thoracic;  Laterality: Left;  . Lymph node dissection Left 10/26/2014    Procedure: LYMPH NODE DISSECTION;  Surgeon: [name], MD;  Location: [place];  Service: Thoracic;  Laterality: Left;  . Portacath placement Left 01/02/2015    Procedure: INSERTION PORT-A-CATH;  Surgeon: Aviva Signs, MD;  [facility];  Service: General;  Laterality: Left;    Denies any headaches, dizziness, double vision, fevers, chills, night sweats, vomiting, diarrhea, constipation, chest pain, heart palpitations, shortness of breath, blood in stool, black tarry stool, urinary pain, urinary burning, urinary frequency, hematuria.   PHYSICAL EXAMINATION  ECOG PERFORMANCE STATUS: 2 - Symptomatic, <50% confined to bed  There were no vitals filed for this visit.  GENERAL:alert, no distress, well nourished, comfortable, cooperative, obese, smiling and unaccompanied today in a chemo-recliner SKIN: skin color, texture, turgor are normal. HEAD: Normocephalic, No masses, lesions, tenderness or abnormalities EYES: normal, PERRLA, EOMI, Conjunctiva are pink and non-injected EARS: External ears normal OROPHARYNX:lips, buccal mucosa, and tongue normal and mucous membranes are moist  NECK: supple, no adenopathy, trachea midline LYMPH:  no palpable lymphadenopathy BREAST:not examined LUNGS: clear to auscultation  HEART: regular rate & rhythm ABDOMEN:abdomen soft, non-tender, obese and normal bowel sounds BACK: Back symmetric, no curvature., No CVA tenderness EXTREMITIES:less then 2 second capillary refill, no joint deformities, effusion, or inflammation, no skin discoloration, no cyanosis  NEURO: no focal motor/sensory deficits, gait normal   LABORATORY DATA: CBC    Component Value Date/Time   WBC 4.1 03/08/2015 0936   RBC 3.00* 03/08/2015 0936   RBC 3.84* 11/07/2009 1649   HGB 8.5* 03/08/2015 0936   HCT 26.6*  03/08/2015 0936   PLT 170 03/08/2015 0936   MCV 88.7 03/08/2015 0936   MCH 28.3 03/08/2015 0936   MCHC 32.0 03/08/2015 0936  RDW 17.8* 03/08/2015 0936   LYMPHSABS 0.4* 03/08/2015 0936   MONOABS 0.5 03/08/2015 0936   EOSABS 0.4 03/08/2015 0936   BASOSABS 0.0 03/08/2015 0936      Chemistry      Component Value Date/Time   NA 137 03/08/2015 0936   K 4.5 03/08/2015 0936   CL 108 03/08/2015 0936   CO2 21* 03/08/2015 0936   BUN 35* 03/08/2015 0936   CREATININE 1.22* 03/08/2015 0936   CREATININE 1.11* 02/08/2013 1451      Component Value Date/Time   CALCIUM 7.7* 03/08/2015 0936   ALKPHOS 157* 03/08/2015 0936   AST 16 03/08/2015 0936   ALT 14 03/08/2015 0936   BILITOT 0.4 03/08/2015 0936      Lab Results  Component Value Date   CEA 4.5 01/04/2015   Lab Results  Component Value Date   FERRITIN 62 01/04/2015     PENDING LABS:   RADIOGRAPHIC STUDIES:  No results found.   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 Roxan Hockey on 10/26/2014 demonstrating recurrent Stage IV disease after undergoing Xeloda+XRT finishing on 05/04/2013, followed by APR on 07/26/2013 by Dr. Arnoldo Morale with marked cytoreduction documented, followed by 6 months worth of Xeloda 1000 mg BID 2 weeks on and 1 week off x 6 months.  Surveillance per NCCN guidelines was followed with serial CT imaging of chest demonstrating an enlarging pulmonary nodule as mentioned previously.  Now on systemic chemotherapy consisting of FOLFOX + Avastin beginning on 01/04/2015.  Oncology history is up-to-date.  Pre-treatment labs today.  Hypocalcemia is noted today and therefore, I will provide an Rx for Oscal+D to be taken 2 daily.  Aranesp today as planned at renal dosing.    Will defer treatment x 1 week due to recent URI and patient recovering from that.  Her WBC is WNL, but  LLN.  Her HGB is stable, but minimall down compared to 2 weeks ago.  Renal function with a little improvement today is noted.  Return in 1 week for treatment.  Return in 3 weeks for follow-up and treatment.    Anemia Started on Aranesp renal dose every 2 weeks.  Ferritin added to labs today.  Standing order for ferritin is placed and should be done every 4-6 weeks to confirm adequate iron stores.  Vitamin B 12 deficiency On monthly B12 injection.  No antibody testing done.  Following completion of chemotherapy, we could consider PO/SL B12 replacement.  For the time being, we will continue with monthly B12 injections coordinated with her treatment dates.    THERAPY PLAN:  Continue with adjuvant FOLFOX + Avastin.    All questions were answered. The patient knows to call the clinic with any problems, questions or concerns. We can certainly see the patient much sooner if necessary.  Patient and plan discussed with Dr. Ancil Linsey and she is in agreement with the aforementioned.   This note is electronically signed by: Doy Mince 03/08/2015 11:13 AM

## 2015-03-07 NOTE — Assessment & Plan Note (Addendum)
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 Roxan Hockey on 10/26/2014 demonstrating recurrent Stage IV disease after undergoing Xeloda+XRT finishing on 05/04/2013, followed by APR on 07/26/2013 by Dr. Arnoldo Morale with marked cytoreduction documented, followed by 6 months worth of Xeloda 1000 mg BID 2 weeks on and 1 week off x 6 months.  Surveillance per NCCN guidelines was followed with serial CT imaging of chest demonstrating an enlarging pulmonary nodule as mentioned previously.  Now on systemic chemotherapy consisting of FOLFOX + Avastin beginning on 01/04/2015.  Oncology history is up-to-date.  Pre-treatment labs today.  Hypocalcemia is noted today and therefore, I will provide an Rx for Oscal+D to be taken 2 daily.  Aranesp today as planned at renal dosing.    Will defer treatment x 1 week due to recent URI and patient recovering from that.  Her WBC is WNL, but LLN.  Her HGB is stable, but minimall down compared to 2 weeks ago.  Renal function with a little improvement today is noted.  Return in 1 week for treatment.  Return in 3 weeks for follow-up and treatment.

## 2015-03-07 NOTE — Assessment & Plan Note (Signed)
On monthly B12 injection.  No antibody testing done.  Following completion of chemotherapy, we could consider PO/SL B12 replacement.  For the time being, we will continue with monthly B12 injections coordinated with her treatment dates.

## 2015-03-08 ENCOUNTER — Encounter (HOSPITAL_BASED_OUTPATIENT_CLINIC_OR_DEPARTMENT_OTHER): Payer: Medicare Other

## 2015-03-08 ENCOUNTER — Encounter (HOSPITAL_BASED_OUTPATIENT_CLINIC_OR_DEPARTMENT_OTHER): Payer: Medicare Other | Admitting: Oncology

## 2015-03-08 ENCOUNTER — Encounter (HOSPITAL_COMMUNITY): Payer: Self-pay | Admitting: Oncology

## 2015-03-08 VITALS — BP 150/58 | HR 94 | Temp 97.9°F | Resp 18 | Wt 227.8 lb

## 2015-03-08 DIAGNOSIS — Z95828 Presence of other vascular implants and grafts: Secondary | ICD-10-CM

## 2015-03-08 DIAGNOSIS — C7800 Secondary malignant neoplasm of unspecified lung: Secondary | ICD-10-CM

## 2015-03-08 DIAGNOSIS — N189 Chronic kidney disease, unspecified: Secondary | ICD-10-CM | POA: Diagnosis not present

## 2015-03-08 DIAGNOSIS — C20 Malignant neoplasm of rectum: Secondary | ICD-10-CM

## 2015-03-08 DIAGNOSIS — E538 Deficiency of other specified B group vitamins: Secondary | ICD-10-CM

## 2015-03-08 DIAGNOSIS — D631 Anemia in chronic kidney disease: Secondary | ICD-10-CM

## 2015-03-08 LAB — COMPREHENSIVE METABOLIC PANEL
ALT: 14 U/L (ref 14–54)
AST: 16 U/L (ref 15–41)
Albumin: 3.4 g/dL — ABNORMAL LOW (ref 3.5–5.0)
Alkaline Phosphatase: 157 U/L — ABNORMAL HIGH (ref 38–126)
Anion gap: 8 (ref 5–15)
BILIRUBIN TOTAL: 0.4 mg/dL (ref 0.3–1.2)
BUN: 35 mg/dL — AB (ref 6–20)
CO2: 21 mmol/L — ABNORMAL LOW (ref 22–32)
CREATININE: 1.22 mg/dL — AB (ref 0.44–1.00)
Calcium: 7.7 mg/dL — ABNORMAL LOW (ref 8.9–10.3)
Chloride: 108 mmol/L (ref 101–111)
GFR, EST AFRICAN AMERICAN: 53 mL/min — AB (ref >60)
GFR, EST NON AFRICAN AMERICAN: 46 mL/min — AB (ref >60)
Glucose, Bld: 136 mg/dL — ABNORMAL HIGH (ref 65–99)
Potassium: 4.5 mmol/L (ref 3.5–5.1)
Sodium: 137 mmol/L (ref 135–145)
TOTAL PROTEIN: 6.6 g/dL (ref 6.5–8.1)

## 2015-03-08 LAB — CBC WITH DIFFERENTIAL/PLATELET
BASOS ABS: 0 10*3/uL (ref 0.0–0.1)
BASOS PCT: 0 %
EOS ABS: 0.4 10*3/uL (ref 0.0–0.7)
Eosinophils Relative: 9 %
HEMATOCRIT: 26.6 % — AB (ref 36.0–46.0)
HEMOGLOBIN: 8.5 g/dL — AB (ref 12.0–15.0)
Lymphocytes Relative: 9 %
Lymphs Abs: 0.4 10*3/uL — ABNORMAL LOW (ref 0.7–4.0)
MCH: 28.3 pg (ref 26.0–34.0)
MCHC: 32 g/dL (ref 30.0–36.0)
MCV: 88.7 fL (ref 78.0–100.0)
MONOS PCT: 13 %
Monocytes Absolute: 0.5 10*3/uL (ref 0.1–1.0)
NEUTROS ABS: 2.9 10*3/uL (ref 1.7–7.7)
NEUTROS PCT: 69 %
Platelets: 170 10*3/uL (ref 150–400)
RBC: 3 MIL/uL — AB (ref 3.87–5.11)
RDW: 17.8 % — AB (ref 11.5–15.5)
WBC: 4.1 10*3/uL (ref 4.0–10.5)

## 2015-03-08 LAB — URINALYSIS, DIPSTICK ONLY
BILIRUBIN URINE: NEGATIVE
Glucose, UA: NEGATIVE mg/dL
KETONES UR: NEGATIVE mg/dL
Leukocytes, UA: NEGATIVE
NITRITE: NEGATIVE
PROTEIN: NEGATIVE mg/dL
Specific Gravity, Urine: <1.005 — ABNORMAL LOW (ref 1.005–1.030)
pH: 5.5 (ref 5.0–8.0)

## 2015-03-08 LAB — FERRITIN: Ferritin: 135 ng/mL (ref 11–307)

## 2015-03-08 MED ORDER — DARBEPOETIN ALFA 100 MCG/0.5ML IJ SOSY
80.0000 ug | PREFILLED_SYRINGE | Freq: Once | INTRAMUSCULAR | Status: AC
  Administered 2015-03-08: 80 ug via SUBCUTANEOUS
  Filled 2015-03-08: qty 0.5

## 2015-03-08 MED ORDER — HEPARIN SOD (PORK) LOCK FLUSH 100 UNIT/ML IV SOLN
INTRAVENOUS | Status: AC
  Filled 2015-03-08: qty 5

## 2015-03-08 MED ORDER — HEPARIN SOD (PORK) LOCK FLUSH 100 UNIT/ML IV SOLN
500.0000 [IU] | Freq: Once | INTRAVENOUS | Status: AC
  Administered 2015-03-08: 500 [IU] via INTRAVENOUS

## 2015-03-08 MED ORDER — SODIUM CHLORIDE 0.9 % IJ SOLN
10.0000 mL | Freq: Once | INTRAMUSCULAR | Status: AC
Start: 2015-03-08 — End: 2015-03-08
  Administered 2015-03-08: 10 mL via INTRAVENOUS

## 2015-03-08 MED ORDER — CALCIUM CARBONATE-VITAMIN D 500-200 MG-UNIT PO TABS: 2.0000 | ORAL_TABLET | Freq: Every day | ORAL | Status: DC

## 2015-03-08 NOTE — Patient Instructions (Signed)
..  [place] at [facility] Discharge Instructions  RECOMMENDATIONS MADE BY THE CONSULTANT AND ANY TEST RESULTS WILL BE SENT TO YOUR REFERRING PHYSICIAN.  Oscal D twice daily- prescription given Defer treatment 1 week aranesp today  Return in 3 weeks for f/u and treatment  Thank you for choosing [place] at [facility] to provide your oncology and hematology care.  To afford each patient quality time with our provider, please arrive at least 15 minutes before your scheduled appointment time.    You need to re-schedule your appointment should you arrive 10 or more minutes late.  We strive to give you quality time with our providers, and arriving late affects you and other patients whose appointments are after yours.  Also, if you no show three or more times for appointments you may be dismissed from the clinic at the providers discretion.     Again, thank you for choosing [facility].  Our hope is that these requests will decrease the amount of time that you wait before being seen by our physicians.       _____________________________________________________________  Should you have questions after your visit to [facility], please contact our office at [phone] between the hours of 8:30 a.m. and 4:30 p.m.  Voicemails left after 4:30 p.m. will not be returned until the following business day.  For prescription refill requests, have your pharmacy contact our office.

## 2015-03-08 NOTE — Progress Notes (Signed)
HOLD chemo today. Reschedule for 1 week.  [name] presented for Portacath access and flush. Proper placement of portacath confirmed by CXR. Portacath located left chest wall accessed with  H 20 needle. Good blood return present. Portacath flushed with 41m NS and 500U/54mHeparin and needle removed intact. Procedure without incident. Patient tolerated procedure well.    Jennifer Detwilerresents today for injection per MD orders. Aranesp 80 mcg administered SQ in right Abdomen. Administration without incident. Patient tolerated well.

## 2015-03-08 NOTE — Patient Instructions (Signed)
[place] at [facility] Discharge Instructions  RECOMMENDATIONS MADE BY THE CONSULTANT AND ANY TEST RESULTS WILL BE SENT TO YOUR REFERRING PHYSICIAN.  HOLD CHEMOTHERAPY today. Hemoglobin 8.5 today. Aranesp 80 mcg given as ordered. Collect urine specimen prior to appointment next Wed (the morning of just before you leave home). You were given a Raquelle Pietro prescription for Calcium plus Vitamin D. Take as directed. Return as scheduled.  Thank you for choosing [place] at [facility] to provide your oncology and hematology care.  To afford each patient quality time with our provider, please arrive at least 15 minutes before your scheduled appointment time.    You need to re-schedule your appointment should you arrive 10 or more minutes late.  We strive to give you quality time with our providers, and arriving late affects you and other patients whose appointments are after yours.  Also, if you no show three or more times for appointments you may be dismissed from the clinic at the providers discretion.     Again, thank you for choosing [facility].  Our hope is that these requests will decrease the amount of time that you wait before being seen by our physicians.       _____________________________________________________________  Should you have questions after your visit to [facility], please contact our office at [phone] between the hours of 8:30 a.m. and 4:30 p.m.  Voicemails left after 4:30 p.m. will not be returned until the following business day.  For prescription refill requests, have your pharmacy contact our office.

## 2015-03-10 ENCOUNTER — Encounter (HOSPITAL_COMMUNITY): Payer: Medicare Other

## 2015-03-15 ENCOUNTER — Ambulatory Visit (HOSPITAL_COMMUNITY): Payer: Medicare Other | Admitting: Oncology

## 2015-03-15 ENCOUNTER — Encounter (HOSPITAL_BASED_OUTPATIENT_CLINIC_OR_DEPARTMENT_OTHER): Payer: Medicare Other

## 2015-03-15 ENCOUNTER — Encounter: Payer: Self-pay | Admitting: *Deleted

## 2015-03-15 ENCOUNTER — Other Ambulatory Visit (HOSPITAL_COMMUNITY): Payer: Self-pay | Admitting: Oncology

## 2015-03-15 ENCOUNTER — Inpatient Hospital Stay (HOSPITAL_COMMUNITY): Payer: Medicare Other

## 2015-03-15 ENCOUNTER — Ambulatory Visit (HOSPITAL_COMMUNITY): Payer: Medicare Other

## 2015-03-15 VITALS — BP 148/76 | HR 80 | Temp 98.0°F | Resp 16

## 2015-03-15 DIAGNOSIS — C7800 Secondary malignant neoplasm of unspecified lung: Secondary | ICD-10-CM

## 2015-03-15 DIAGNOSIS — R911 Solitary pulmonary nodule: Secondary | ICD-10-CM

## 2015-03-15 DIAGNOSIS — C20 Malignant neoplasm of rectum: Secondary | ICD-10-CM | POA: Diagnosis not present

## 2015-03-15 DIAGNOSIS — Z5111 Encounter for antineoplastic chemotherapy: Secondary | ICD-10-CM | POA: Diagnosis not present

## 2015-03-15 DIAGNOSIS — Z5112 Encounter for antineoplastic immunotherapy: Secondary | ICD-10-CM | POA: Diagnosis not present

## 2015-03-15 LAB — COMPREHENSIVE METABOLIC PANEL
ALT: 14 U/L (ref 14–54)
AST: 18 U/L (ref 15–41)
Albumin: 3.6 g/dL (ref 3.5–5.0)
Alkaline Phosphatase: 157 U/L — ABNORMAL HIGH (ref 38–126)
Anion gap: 7 (ref 5–15)
BILIRUBIN TOTAL: 0.3 mg/dL (ref 0.3–1.2)
BUN: 31 mg/dL — AB (ref 6–20)
CALCIUM: 8.2 mg/dL — AB (ref 8.9–10.3)
CO2: 22 mmol/L (ref 22–32)
Chloride: 105 mmol/L (ref 101–111)
Creatinine, Ser: 1.21 mg/dL — ABNORMAL HIGH (ref 0.44–1.00)
GFR, EST AFRICAN AMERICAN: 54 mL/min — AB (ref >60)
GFR, EST NON AFRICAN AMERICAN: 47 mL/min — AB (ref >60)
GLUCOSE: 158 mg/dL — AB (ref 65–99)
Potassium: 5 mmol/L (ref 3.5–5.1)
Sodium: 134 mmol/L — ABNORMAL LOW (ref 135–145)
TOTAL PROTEIN: 6.6 g/dL (ref 6.5–8.1)

## 2015-03-15 LAB — CBC WITH DIFFERENTIAL/PLATELET
BASOS ABS: 0 10*3/uL (ref 0.0–0.1)
Basophils Relative: 1 %
EOS PCT: 13 %
Eosinophils Absolute: 0.4 10*3/uL (ref 0.0–0.7)
HEMATOCRIT: 28.4 % — AB (ref 36.0–46.0)
Hemoglobin: 9.1 g/dL — ABNORMAL LOW (ref 12.0–15.0)
LYMPHS ABS: 0.5 10*3/uL — AB (ref 0.7–4.0)
LYMPHS PCT: 15 %
MCH: 28.9 pg (ref 26.0–34.0)
MCHC: 32 g/dL (ref 30.0–36.0)
MCV: 90.2 fL (ref 78.0–100.0)
MONO ABS: 0.5 10*3/uL (ref 0.1–1.0)
Monocytes Relative: 17 %
NEUTROS ABS: 1.6 10*3/uL — AB (ref 1.7–7.7)
Neutrophils Relative %: 54 %
PLATELETS: 170 10*3/uL (ref 150–400)
RBC: 3.15 MIL/uL — AB (ref 3.87–5.11)
RDW: 18.6 % — ABNORMAL HIGH (ref 11.5–15.5)
WBC: 3 10*3/uL — ABNORMAL LOW (ref 4.0–10.5)

## 2015-03-15 LAB — URINALYSIS, DIPSTICK ONLY
BILIRUBIN URINE: NEGATIVE
GLUCOSE, UA: NEGATIVE mg/dL
HGB URINE DIPSTICK: NEGATIVE
KETONES UR: NEGATIVE mg/dL
Leukocytes, UA: NEGATIVE
Nitrite: NEGATIVE
PROTEIN: NEGATIVE mg/dL
Specific Gravity, Urine: <1.005 — ABNORMAL LOW (ref 1.005–1.030)
pH: 5.5 (ref 5.0–8.0)

## 2015-03-15 MED ORDER — SODIUM CHLORIDE 0.9 % IV SOLN
Freq: Once | INTRAVENOUS | Status: AC
  Administered 2015-03-15: 12:00:00 via INTRAVENOUS
  Filled 2015-03-15: qty 4

## 2015-03-15 MED ORDER — DEXTROSE 5 % IV SOLN
Freq: Once | INTRAVENOUS | Status: AC
  Administered 2015-03-15: 13:00:00 via INTRAVENOUS

## 2015-03-15 MED ORDER — OXALIPLATIN CHEMO INJECTION 100 MG/20ML
85.0000 mg/m2 | Freq: Once | INTRAVENOUS | Status: AC
  Administered 2015-03-15: 185 mg via INTRAVENOUS
  Filled 2015-03-15: qty 37

## 2015-03-15 MED ORDER — SODIUM CHLORIDE 0.9 % IV SOLN
2400.0000 mg/m2 | INTRAVENOUS | Status: DC
  Administered 2015-03-15: 5200 mg via INTRAVENOUS
  Filled 2015-03-15: qty 104

## 2015-03-15 MED ORDER — SODIUM CHLORIDE 0.9 % IV SOLN
5.0000 mg/kg | Freq: Once | INTRAVENOUS | Status: AC
  Administered 2015-03-15: 525 mg via INTRAVENOUS
  Filled 2015-03-15: qty 4

## 2015-03-15 MED ORDER — FLUOROURACIL CHEMO INJECTION 2.5 GM/50ML
400.0000 mg/m2 | Freq: Once | INTRAVENOUS | Status: AC
  Administered 2015-03-15: 850 mg via INTRAVENOUS
  Filled 2015-03-15: qty 17

## 2015-03-15 MED ORDER — SODIUM CHLORIDE 0.9 % IV SOLN
Freq: Once | INTRAVENOUS | Status: AC
  Administered 2015-03-15: 11:00:00 via INTRAVENOUS

## 2015-03-15 MED ORDER — DEXTROSE 5 % IV SOLN
400.0000 mg/m2 | Freq: Once | INTRAVENOUS | Status: AC
  Administered 2015-03-15: 868 mg via INTRAVENOUS
  Filled 2015-03-15: qty 43.4

## 2015-03-15 NOTE — Progress Notes (Signed)
[place] Clinical Social Work  Clinical Social Work was referred by [place] rounding for re-assessment of psychosocial needs due to possible financial/resource needs. Clinical Social Worker met with patient at [facility] during chemo to offer support and assess for needs.  CSW reviewed additional resources that could assist such as [place], [place]anne [place] and support group. CSW provided her with handouts and contact information. Pt denied issues or current needs with transportation. Pt reports she will consider support group. Pt aware to reach out as needed.   Clinical Social Work interventions: Resource education Supportive listening  [place], [place] Tuesdays   [phone]

## 2015-03-15 NOTE — Patient Instructions (Signed)
[facility] Discharge Instructions for Patients Receiving Chemotherapy  Today you received the following chemotherapy agents: oxaliplatin, avastin, and fluorouracil.     If you develop nausea and vomiting, or diarrhea that is not controlled by your medication, call the clinic.  The clinic phone number is [phone]. Office hours are Monday-Friday 8:30am-5:00pm.  BELOW ARE SYMPTOMS THAT SHOULD BE REPORTED IMMEDIATELY:  *FEVER GREATER THAN 101.0 F  *CHILLS WITH OR WITHOUT FEVER  NAUSEA AND VOMITING THAT IS NOT CONTROLLED WITH YOUR NAUSEA MEDICATION  *UNUSUAL SHORTNESS OF BREATH  *UNUSUAL BRUISING OR BLEEDING  TENDERNESS IN MOUTH AND THROAT WITH OR WITHOUT PRESENCE OF ULCERS  *URINARY PROBLEMS  *BOWEL PROBLEMS  UNUSUAL RASH Items with * indicate a potential emergency and should be followed up as soon as possible. If you have an emergency after office hours please contact your primary care physician or go to the nearest emergency department.  Please call the clinic during office hours if you have any questions or concerns.   You may also contact the Patient Navigator at [phone] should you have any questions or need assistance in obtaining follow up care.

## 2015-03-15 NOTE — Progress Notes (Signed)
Patient tolerated infusion well.  VSS.   

## 2015-03-17 ENCOUNTER — Encounter (HOSPITAL_COMMUNITY): Payer: Medicare Other

## 2015-03-17 ENCOUNTER — Encounter (HOSPITAL_BASED_OUTPATIENT_CLINIC_OR_DEPARTMENT_OTHER): Payer: Medicare Other

## 2015-03-17 VITALS — BP 186/78 | HR 89 | Temp 98.3°F | Resp 20

## 2015-03-17 DIAGNOSIS — C20 Malignant neoplasm of rectum: Secondary | ICD-10-CM | POA: Diagnosis not present

## 2015-03-17 DIAGNOSIS — Z5189 Encounter for other specified aftercare: Secondary | ICD-10-CM

## 2015-03-17 DIAGNOSIS — R911 Solitary pulmonary nodule: Secondary | ICD-10-CM

## 2015-03-17 DIAGNOSIS — C7800 Secondary malignant neoplasm of unspecified lung: Secondary | ICD-10-CM

## 2015-03-17 MED ORDER — HEPARIN SOD (PORK) LOCK FLUSH 100 UNIT/ML IV SOLN
500.0000 [IU] | Freq: Once | INTRAVENOUS | Status: AC | PRN
  Administered 2015-03-17: 500 [IU]

## 2015-03-17 MED ORDER — PEGFILGRASTIM 6 MG/0.6ML ~~LOC~~ PSKT
6.0000 mg | PREFILLED_SYRINGE | Freq: Once | SUBCUTANEOUS | Status: AC
  Administered 2015-03-17: 6 mg via SUBCUTANEOUS

## 2015-03-17 MED ORDER — HEPARIN SOD (PORK) LOCK FLUSH 100 UNIT/ML IV SOLN
INTRAVENOUS | Status: AC
  Filled 2015-03-17: qty 5

## 2015-03-17 MED ORDER — PEGFILGRASTIM 6 MG/0.6ML ~~LOC~~ PSKT
PREFILLED_SYRINGE | SUBCUTANEOUS | Status: AC
  Filled 2015-03-17: qty 0.6

## 2015-03-17 MED ORDER — SODIUM CHLORIDE 0.9 % IJ SOLN
10.0000 mL | INTRAMUSCULAR | Status: DC | PRN
  Administered 2015-03-17: 10 mL
  Filled 2015-03-17: qty 10

## 2015-03-17 NOTE — Progress Notes (Signed)
Patient tolerated home infusion well.  VSS.  No distress or symptoms. AVS contains information on removal of OnPro for the caregivers.

## 2015-03-17 NOTE — Patient Instructions (Signed)
[place] at [facility] Discharge Instructions  RECOMMENDATIONS MADE BY THE CONSULTANT AND ANY TEST RESULTS WILL BE SENT TO YOUR REFERRING PHYSICIAN.  Remove the OnPro on right arm at 8:00pm Saturday night.  It can be thrown in the trash can.    Thank you for choosing [place] at [facility] to provide your oncology and hematology care.  To afford each patient quality time with our provider, please arrive at least 15 minutes before your scheduled appointment time.    You need to re-schedule your appointment should you arrive 10 or more minutes late.  We strive to give you quality time with our providers, and arriving late affects you and other patients whose appointments are after yours.  Also, if you no show three or more times for appointments you may be dismissed from the clinic at the providers discretion.     Again, thank you for choosing [facility].  Our hope is that these requests will decrease the amount of time that you wait before being seen by our physicians.       _____________________________________________________________  Should you have questions after your visit to [facility], please contact our office at [phone] between the hours of 8:30 a.m. and 4:30 p.m.  Voicemails left after 4:30 p.m. will not be returned until the following business day.  For prescription refill requests, have your pharmacy contact our office.

## 2015-03-29 ENCOUNTER — Encounter (HOSPITAL_COMMUNITY): Payer: Self-pay | Admitting: Hematology & Oncology

## 2015-03-29 ENCOUNTER — Encounter (HOSPITAL_BASED_OUTPATIENT_CLINIC_OR_DEPARTMENT_OTHER): Payer: Medicare Other

## 2015-03-29 ENCOUNTER — Encounter (HOSPITAL_COMMUNITY): Payer: Medicare Other | Attending: Hematology and Oncology | Admitting: Hematology & Oncology

## 2015-03-29 VITALS — BP 160/82 | HR 88 | Temp 97.8°F | Resp 20

## 2015-03-29 VITALS — BP 168/62 | HR 90 | Temp 97.7°F | Resp 20 | Wt 228.0 lb

## 2015-03-29 DIAGNOSIS — C7800 Secondary malignant neoplasm of unspecified lung: Secondary | ICD-10-CM

## 2015-03-29 DIAGNOSIS — R911 Solitary pulmonary nodule: Secondary | ICD-10-CM

## 2015-03-29 DIAGNOSIS — Z5111 Encounter for antineoplastic chemotherapy: Secondary | ICD-10-CM

## 2015-03-29 DIAGNOSIS — N189 Chronic kidney disease, unspecified: Secondary | ICD-10-CM | POA: Diagnosis not present

## 2015-03-29 DIAGNOSIS — C20 Malignant neoplasm of rectum: Secondary | ICD-10-CM | POA: Diagnosis present

## 2015-03-29 DIAGNOSIS — D649 Anemia, unspecified: Secondary | ICD-10-CM | POA: Insufficient documentation

## 2015-03-29 DIAGNOSIS — Z85048 Personal history of other malignant neoplasm of rectum, rectosigmoid junction, and anus: Secondary | ICD-10-CM

## 2015-03-29 DIAGNOSIS — Z5112 Encounter for antineoplastic immunotherapy: Secondary | ICD-10-CM | POA: Diagnosis not present

## 2015-03-29 DIAGNOSIS — D631 Anemia in chronic kidney disease: Secondary | ICD-10-CM

## 2015-03-29 DIAGNOSIS — E1165 Type 2 diabetes mellitus with hyperglycemia: Secondary | ICD-10-CM

## 2015-03-29 DIAGNOSIS — G40909 Epilepsy, unspecified, not intractable, without status epilepticus: Secondary | ICD-10-CM

## 2015-03-29 DIAGNOSIS — E538 Deficiency of other specified B group vitamins: Secondary | ICD-10-CM

## 2015-03-29 DIAGNOSIS — C19 Malignant neoplasm of rectosigmoid junction: Secondary | ICD-10-CM

## 2015-03-29 DIAGNOSIS — F79 Unspecified intellectual disabilities: Secondary | ICD-10-CM

## 2015-03-29 DIAGNOSIS — Z794 Long term (current) use of insulin: Secondary | ICD-10-CM

## 2015-03-29 DIAGNOSIS — D72828 Other elevated white blood cell count: Secondary | ICD-10-CM

## 2015-03-29 LAB — URINALYSIS, ROUTINE W REFLEX MICROSCOPIC
Bilirubin Urine: NEGATIVE
GLUCOSE, UA: NEGATIVE mg/dL
Ketones, ur: NEGATIVE mg/dL
LEUKOCYTES UA: NEGATIVE
Nitrite: NEGATIVE
PROTEIN: NEGATIVE mg/dL
Specific Gravity, Urine: <1.005 — ABNORMAL LOW (ref 1.005–1.030)
pH: 5.5 (ref 5.0–8.0)

## 2015-03-29 LAB — CBC WITH DIFFERENTIAL/PLATELET
BASOS PCT: 0 %
Basophils Absolute: 0 10*3/uL (ref 0.0–0.1)
EOS ABS: 0.4 10*3/uL (ref 0.0–0.7)
Eosinophils Relative: 2 %
HEMATOCRIT: 29.9 % — AB (ref 36.0–46.0)
HEMOGLOBIN: 9.5 g/dL — AB (ref 12.0–15.0)
LYMPHS ABS: 0.9 10*3/uL (ref 0.7–4.0)
Lymphocytes Relative: 5 %
MCH: 29.1 pg (ref 26.0–34.0)
MCHC: 31.8 g/dL (ref 30.0–36.0)
MCV: 91.7 fL (ref 78.0–100.0)
Monocytes Absolute: 0.7 10*3/uL (ref 0.1–1.0)
Monocytes Relative: 4 %
NEUTROS ABS: 15.8 10*3/uL — AB (ref 1.7–7.7)
NEUTROS PCT: 89 %
Platelets: 110 10*3/uL — ABNORMAL LOW (ref 150–400)
RBC: 3.26 MIL/uL — AB (ref 3.87–5.11)
RDW: 17.6 % — ABNORMAL HIGH (ref 11.5–15.5)
WBC: 17.8 10*3/uL — AB (ref 4.0–10.5)

## 2015-03-29 LAB — COMPREHENSIVE METABOLIC PANEL
ALBUMIN: 3.5 g/dL (ref 3.5–5.0)
ALK PHOS: 184 U/L — AB (ref 38–126)
ALT: 21 U/L (ref 14–54)
AST: 23 U/L (ref 15–41)
Anion gap: 10 (ref 5–15)
BUN: 29 mg/dL — AB (ref 6–20)
CALCIUM: 7.3 mg/dL — AB (ref 8.9–10.3)
CO2: 22 mmol/L (ref 22–32)
CREATININE: 1.29 mg/dL — AB (ref 0.44–1.00)
Chloride: 107 mmol/L (ref 101–111)
GFR calc Af Amer: 50 mL/min — ABNORMAL LOW (ref >60)
GFR calc non Af Amer: 43 mL/min — ABNORMAL LOW (ref >60)
GLUCOSE: 172 mg/dL — AB (ref 65–99)
Potassium: 4.5 mmol/L (ref 3.5–5.1)
SODIUM: 139 mmol/L (ref 135–145)
Total Bilirubin: 0.3 mg/dL (ref 0.3–1.2)
Total Protein: 6.8 g/dL (ref 6.5–8.1)

## 2015-03-29 LAB — URINE MICROSCOPIC-ADD ON

## 2015-03-29 MED ORDER — SODIUM CHLORIDE 0.9 % IV SOLN
Freq: Once | INTRAVENOUS | Status: AC
  Administered 2015-03-29: 12:00:00 via INTRAVENOUS
  Filled 2015-03-29: qty 4

## 2015-03-29 MED ORDER — DEXTROSE 5 % IV SOLN
Freq: Once | INTRAVENOUS | Status: AC
  Administered 2015-03-29: 11:00:00 via INTRAVENOUS

## 2015-03-29 MED ORDER — OXALIPLATIN CHEMO INJECTION 100 MG/20ML
85.0000 mg/m2 | Freq: Once | INTRAVENOUS | Status: AC
  Administered 2015-03-29: 185 mg via INTRAVENOUS
  Filled 2015-03-29: qty 37

## 2015-03-29 MED ORDER — DARBEPOETIN ALFA 100 MCG/0.5ML IJ SOSY
80.0000 ug | PREFILLED_SYRINGE | Freq: Once | INTRAMUSCULAR | Status: AC
  Administered 2015-03-29: 80 ug via SUBCUTANEOUS

## 2015-03-29 MED ORDER — SODIUM CHLORIDE 0.9 % IV SOLN
5.0000 mg/kg | Freq: Once | INTRAVENOUS | Status: AC
  Administered 2015-03-29: 525 mg via INTRAVENOUS
  Filled 2015-03-29: qty 17

## 2015-03-29 MED ORDER — SODIUM CHLORIDE 0.9 % IV SOLN
2400.0000 mg/m2 | INTRAVENOUS | Status: DC
  Administered 2015-03-29: 5200 mg via INTRAVENOUS
  Filled 2015-03-29: qty 104

## 2015-03-29 MED ORDER — LEUCOVORIN CALCIUM INJECTION 100 MG
20.0000 mg/m2 | Freq: Once | INTRAMUSCULAR | Status: AC
  Administered 2015-03-29: 44 mg via INTRAVENOUS
  Filled 2015-03-29: qty 2.2

## 2015-03-29 MED ORDER — FLUOROURACIL CHEMO INJECTION 2.5 GM/50ML
400.0000 mg/m2 | Freq: Once | INTRAVENOUS | Status: AC
  Administered 2015-03-29: 850 mg via INTRAVENOUS
  Filled 2015-03-29: qty 17

## 2015-03-29 MED ORDER — DARBEPOETIN ALFA 100 MCG/0.5ML IJ SOSY
PREFILLED_SYRINGE | INTRAMUSCULAR | Status: AC
  Filled 2015-03-29: qty 0.5

## 2015-03-29 MED ORDER — LEUCOVORIN CALCIUM INJECTION 350 MG: 400.0000 mg/m2 | Freq: Once | INTRAVENOUS | Status: DC

## 2015-03-29 MED ORDER — SODIUM CHLORIDE 0.9 % IJ SOLN: 10.0000 mL | INTRAMUSCULAR | Status: DC | PRN

## 2015-03-29 NOTE — Progress Notes (Signed)
..  [name] presents today for injection per the provider's orders.  aranesp  administration without incident; see MAR for injection details.  Patient tolerated procedure well and without incident.  No questions or complaints noted at this time. Attempted to obtain urine x 2 with no success. Patient misses nuns cap placed in toilet. Discussed with Dr. Whitney Muse and we will proceed with avastin today and send u/a cup to rest home for another attempt to collect u/a and an extra cup to collect the morning of next treatment Discussed low calcium with Dr. Whitney Muse. Patient should be taking 2 calcium daily. Will reinforce importance of this to rest home.

## 2015-03-29 NOTE — Progress Notes (Signed)
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 female returns for followup of Locally advanced adenocarcinoma of the rectum, She tolerated combined modality therapy well. Radiation therapy completed on 05/04/2013 which was also the last day she received Xeloda, excellent tolerance, status post APR on 07/26/2013 with marked cytoreduction documented as evidenced by no lymph node involvement. pT3 tumor was found. S/P 6 months of Xeloda at 1000 mg BID 2 weeks on and 1 week off finishing in December 2015.   She has just undergone successful resection of a solitary pulmonary nodule, unfortunately pathology was consistent with recurrent colorectal cancer.  She lives at [facility] off of [address].   [name] returns to the [employer] today with a female caregiver.   When asked how she's doing today, [name] says "I'm fine." She states that she's been okay and denies any more vomiting or things like that. Her caregiver confirms that she's been doing well and hasn't had any vomiting or any spells lately. Her caregiver adds that, sometimes, [name] complains about her side hurting, but it's "nothing excruciating or anything." Her caregiver also states that [name] is not sleeping well, but that this is chronic.  Today, [name] says her holidays were fine, and is smiling and pleasant.  Her caregiver says that her legs swell every day, but that this is nothing new.   During the physical exam, [name] remarks "sometimes I get short-winded," and states that this happens "a lot," but that this is the same as it used to be. Her caregiver, however, says "it might be a little worse now, I forgot about that." [name] says that she is still  able to go out onto the porch and sit down, but "that's about it." [name] confirms that she still walks to the [facility], but her caregiver states that, along the way, she has to rest about 3 times.  [name] says that her ostomy bag is fine and she denies any diarrhea or constipation, which is confirmed by her caregiver. [name] remarks that she can tell when her bag is full. She says she thinks they empty her bag once a day.  She says that she feels up to chemo today.     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   03/25/2013 - 05/05/2013 Chemotherapy Xeloda 1500 mg BID 5 days/week with radiation therapy.   07/26/2013 Definitive Surgery Dr. Arnoldo Morale- Flexible sigmoidoscopy, abdominoperineal resection, total abdominal hysterectomy with bilateral salpingo-oophorectomy   08/30/2013 - 03/01/2014 Chemotherapy Xeloda 1000 mg BID 14 days on and 7 days off x 6 months   03/08/2014 Imaging CT CAP- Bilateral pulmonary nodules, including an 8 mm left lower lobe nodule. Metastatic disease is a concern. The left lower lobe nodule has progressed since 03/05/2013, when it measured 5 mm.   05/26/2014 Imaging CT Chest- Continued enlargement of the superior segment left lower lobe nodule. Malignancy is likely.   In contrast, the right lower lobe nodule is probably benign.   08/17/2014 Imaging CT- Superior segment left lower lobe pulmonary nodule measures stable since the most recent comparison study, but is again noted  to have increased in size when comparing to older studies. Continued close attention will be required as neoplasm remains a con   10/26/2014 Surgery thoracoscopic left lower lobe superior segmentectomy. Pathology with metastatic adenocarcinoma c/w colonic primary   01/04/2015 -  Chemotherapy FOLFOX+Avastin (Avastin started on 01/18/2015).     01/09/2015 Imaging CT CAP- Interval wedge resection of metastasis within the left lower lobe. No evidence of new metastatic disease within the chest, abdomen or pelvis.   02/15/2015 Treatment Plan Change Treatment deferred due to renal function change (patient poor historian) with N/V.   02/22/2015 Treatment Plan Change Added Aranesp at renal dosing to supportive therpay plan.   03/08/2015 Treatment Plan Change Defer treatment x 1 week       Past Medical History  Diagnosis Date  . Hypertension   . Diabetes mellitus     years  . Asthma   . Depression   . Mental retardation     stopped school at 9th grade   . History of recurrent UTIs   . Shortness of breath     with exertion  . Cancer (HCC)     Rectal   . Arthritis   . Gout   . Seizures ([place])     more than 4 yrs since last seizure. UNknown etiology  . Rectal cancer ([name])   . Iron deficiency 04/27/2014  . Chronic renal disease, stage 3, moderately decreased glomerular filtration rate between 30-59 mL/min/1.73 square meter 04/27/2014  . Numbness and tingling in hands     x several months   . GERD (gastroesophageal reflux disease)   . Vitamin B 12 deficiency 02/01/2015    Incidentally found without antibody testing.      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     has No Known Allergies.  Ms. TCoordoes not currently have medications on file.  Past Surgical History  Procedure Laterality Date  . Abdominal hysterectomy    . Multiple extractions with alveoloplasty N/A 11/23/2012    Procedure: MULTIPLE EXTRACION 1, 2, 4, 5, 6, 7, 8, 9, 10, 11, 12, 13, 14, 17, 18, 20, 23, 24, 25, 26, 28, 29, 32 WITH ALVEOLOPLASTY, REMOVE BILATERAL TORI;  Surgeon: SGae Bon DDS;  [place]  Service: Oral Surgery;  Laterality: N/A;  . Colonoscopy with esophagogastroduodenoscopy (egd) N/A 01/14/2013    Procedure: COLONOSCOPY WITH ESOPHAGOGASTRODUODENOSCOPY (EGD);  Surgeon: NRogene Houston MD;  [facility];  Service: Endoscopy;  Laterality: N/A;  250-moved to 315 Ann to notify pt  . Colonoscopy N/A 02/04/2013    Procedure: COLONOSCOPY;  Surgeon: NRogene Houston MD;  [facility];  Service: Endoscopy;  Laterality: N/A;  225  . Eus N/A 02/18/2013    Procedure: LOWER ENDOSCOPIC ULTRASOUND (EUS);  Surgeon: DMilus Banister MD;  [facility];  Service: Endoscopy;  Laterality: N/A;  . Flexible sigmoidoscopy N/A 07/26/2013    Procedure: FLEXIBLE SIGMOIDOSCOPY;  Surgeon: MJamesetta So  MD;  Location: [facility];  Service: General;  Laterality: N/A;  . Abdominal perineal bowel resection N/A 07/26/2013    Procedure:  ABDOMINAL PERINEAL RESECTION;  Surgeon: Jamesetta So, MD;  Location: [facility];  Service: General;  Laterality: N/A;  . Supracervical abdominal hysterectomy N/A 07/26/2013    Procedure: HYSTERECTOMY SUPRACERVICAL ABDOMINAL ;  Surgeon: Jamesetta So, MD;  Location: [facility];  Service: General;  Laterality: N/A;  . Salpingoophorectomy Bilateral 07/26/2013    Procedure: SALPINGO OOPHORECTOMY;  Surgeon: Jamesetta So, MD;  Location: [facility];  Service: General;  Laterality: Bilateral;  . Colostomy Left 07/26/2013    Procedure: COLOSTOMY;  Surgeon: Jamesetta So, MD;  Location: [facility];   Service: General;  Laterality: Left;  . Colonoscopy N/A 05/12/2014    Procedure: COLONOSCOPY;  Surgeon: Rogene Houston, MD;  [facility];  Service: Endoscopy;  Laterality: N/A;  1030  . Colon surgery  2015    bowel resection/w colostomy  . Video assisted thoracoscopy Left 10/26/2014    Procedure: LEFT VIDEO ASSISTED THORACOSCOPY;  Surgeon: [name], MD;  Location[place];  Service: Thoracic;  Laterality: Left;  . Segmentecomy Left 10/26/2014    Procedure: LEFT LOWER LOBE SUPERIOR SEGMENTECTOMY;  Surgeon: [name], MD;  Location: [place];  Service: Thoracic;  Laterality: Left;  . Lymph node dissection Left 10/26/2014    Procedure: LYMPH NODE DISSECTION;  Surgeon: [name], MD;  Location: [place];  Service: Thoracic;  Laterality: Left;  . Portacath placement Left 01/02/2015    Procedure: INSERTION PORT-A-CATH;  Surgeon: Aviva Signs, MD;  [facility]: [facility];  Service: General;  Laterality: Left;    Denies any headaches, dizziness, double vision, fevers, chills, night sweats, nausea, vomiting, diarrhea, constipation, chest pain, heart palpitations, shortness of breath, blood in stool, black tarry stool, urinary pain, urinary burning, urinary frequency, hematuria. 14 point review of systems was performed and is negative except as detailed under history of present illness and above  PHYSICAL EXAMINATION  ECOG PERFORMANCE STATUS: 1  Filed Vitals:   03/29/15 0933  BP: 168/62  Pulse: 90  Temp: 97.7 F (36.5 C)  Resp: 20    Filed Weights   03/29/15 0933  Weight: 228 lb (103.42 kg)    GENERAL:alert, no distress, well nourished, well developed, comfortable, cooperative, obese, smiling Gets onto the exam table without assistance SKIN: skin color, texture, turgor are normal, no rashes or significant lesions HEAD: Normocephalic, No masses, lesions, tenderness or abnormalities EYES: normal, PERRLA, EOMI, Conjunctiva are pink and non-injected EARS:  External ears normal OROPHARYNX:lips, buccal mucosa, and tongue normal and mucous membranes are moist  NECK: supple, no adenopathy, thyroid normal size, non-tender, without nodularity, no stridor, non-tender, trachea midline LYMPH:  no palpable lymphadenopathy BREAST:not examined LUNGS: clear to auscultation  HEART: regular rate & rhythm, no murmurs and no gallops ABDOMEN:abdomen soft, obese and normal bowel sounds BACK: Back symmetric, no curvature., No CVA tenderness EXTREMITIES:less then 2 second capillary refill, no joint deformities, effusion, or inflammation, no skin discoloration, no clubbing, no cyanosis BLE TED hose NEURO: alert & oriented x 3, no focal motor/sensory deficits, gait normal but slow   LABORATORY DATA: I have reviewed the results listed below  CBC    Component Value Date/Time   WBC 3.0* 03/15/2015 0957   RBC 3.15* 03/15/2015 0957   RBC 3.84* 11/07/2009 1649   HGB 9.1* 03/15/2015 0957   HCT 28.4* 03/15/2015 0957   PLT 170 03/15/2015 0957  MCV 90.2 03/15/2015 0957   MCH 28.9 03/15/2015 0957   MCHC 32.0 03/15/2015 0957   RDW 18.6* 03/15/2015 0957   LYMPHSABS 0.5* 03/15/2015 0957   MONOABS 0.5 03/15/2015 0957   EOSABS 0.4 03/15/2015 0957   BASOSABS 0.0 03/15/2015 0957   CMP     Component Value Date/Time   NA 134* 03/15/2015 0957   K 5.0 03/15/2015 0957   CL 105 03/15/2015 0957   CO2 22 03/15/2015 0957   GLUCOSE 158* 03/15/2015 0957   BUN 31* 03/15/2015 0957   CREATININE 1.21* 03/15/2015 0957   CREATININE 1.11* 02/08/2013 1451   CALCIUM 8.2* 03/15/2015 0957   PROT 6.6 03/15/2015 0957   ALBUMIN 3.6 03/15/2015 0957   AST 18 03/15/2015 0957   ALT 14 03/15/2015 0957   ALKPHOS 157* 03/15/2015 0957   BILITOT 0.3 03/15/2015 0957   GFRNONAA 47* 03/15/2015 0957   GFRNONAA 54* 02/08/2013 1451   GFRAA 54* 03/15/2015 0957   GFRAA 62 02/08/2013 1451      PATHOLOGY REPORT OF SURGICAL PATHOLOGY FIAL DIAGNOSIS Diagnosis 1. Lung, wedge  biopsy/resection, superior segment of left lower lobe - METASTATIC ADENOCARCINOMA, CONSISTENT WITH COLONIC PRIMARY, SPANNING 1.5 CM. - LYMPHOVASCULAR INVASION IS IDENTIFIED. - THE SURGICAL RESECTION MARGINS ARE NEGATIVE FOR CARCINOMA. - SEE COMMENT. 2. Lymph node, biopsy, 9 L - THERE IS NO EVIDENCE OF CARCINOMA IN 1 OF 1 LYMPH NODE (0/1). 3. Lymph node, biopsy, 9 L #2 - THERE IS NO EVIDENCE OF CARCINOMA IN 1 OF 1 LYMPH NODE (0/1). 4. Lymph node, biopsy, 11 L - THERE IS NO EVIDENCE OF CARCINOMA IN 1 OF 1 LYMPH NODE (0/1). 5. Lymph node, biopsy, 5 - THERE IS NO EVIDENCE OF CARCINOMA IN 1 OF 1 LYMPH NODE (0/1). Microscopic Comment 1. A block will be sent for KRAS testing and the results reported separately. Additional testing can be performed upon clinician request. (JBK:ds 10/27/14) [name], Electronic Signature (Case signed 10/27/2014) Intraoperative    RADIOGRAPHIC STUDIES:   CLINICAL DATA: Subsequent treatment strategy for restaging rectal cancer. Enlarging pulmonary nodule.  EXAM: NUCLEAR MEDICINE PET SKULL BASE TO THIGH COMPARISON: PET of 03/05/2013. Chest CTs of 08/17/14 and 05/26/2014.  IMPRESSION: 1. Hypermetabolism corresponding to the superior segment left lower lobe lung nodule. This could represent a primary bronchogenic carcinoma or isolated metastatic lesion. Primary bronchogenic carcinomas slightly favored. 2. No evidence of hypermetabolic metastatic disease. 3. Left axillary node is small and demonstrates low-level non malignant range hypermetabolism. Favor reactive etiology. 4. Heterogeneous marrow density and metabolism. The CT appearance has been present back to 2010. Favor Paget's disease. An area of more focal hypermetabolism in the left side of the sacrum is nonspecific and without focal CT correlate. Metastatic disease is felt unlikely but cannot be entirely excluded. Recommend attention on follow-up.   Electronically  Signed  By: Abigail Miyamoto M.D.  On: 10/07/2014 13:02  ASSESSMENT AND PLAN:  Stage IV CRC with solitary pulmonary nodule, s/p resection History of locally advanced rectal cancer Prior tobacco abuse Pagets disease of bone Anemia CKD CEA was elevated at time of initial diagnosis in 2014, but not with recent recurrence Limited decision making capacity  She has undergone successful surgical resection of the solitary pulmonary nodule. Unfortunately pathology was c/w recurrent CRC. The patient and her caregivers opted to proceed with therapy and she is currently receiving FOLFOX/AVASTIN  Her blood counts look good today, so we will proceed with treatment as planned. She is on renal dosing of aranesp and currently  will maintain the same dosing. Last ferritin 3 weeks ago was 135 ng/ml. This will continue to be followed.  We will see her back in 2 weeks. Side effects will be closely monitored.  All questions were answered. The patient knows to call the clinic with any problems, questions or concerns. We can certainly see the patient much sooner if necessary.   This document serves as a record of services personally performed by Ancil Linsey, MD. It was created on her behalf by Toni Amend, a trained medical scribe. The creation of this record is based on the scribe's personal observations and the provider's statements to them. This document has been checked and approved by the attending provider.  I have reviewed the above documentation for accuracy and completeness, and I agree with the above.  This note was electronically signed.  Kelby Fam. Whitney Muse, MD

## 2015-03-29 NOTE — Patient Instructions (Addendum)
[place] at [facility] Discharge Instructions  RECOMMENDATIONS MADE BY THE CONSULTANT AND ANY TEST RESULTS WILL BE SENT TO YOUR REFERRING PHYSICIAN.  Exam and discussion with Dr. Whitney Muse today. Treatment today as scheduled. Return in 2 weeks for office visit and chemotherapy. Call the clinic with any questions or concerns.    Thank you for choosing [place] at [facility] to provide your oncology and hematology care.  To afford each patient quality time with our provider, please arrive at least 15 minutes before your scheduled appointment time.    You need to re-schedule your appointment should you arrive 10 or more minutes late.  We strive to give you quality time with our providers, and arriving late affects you and other patients whose appointments are after yours.  Also, if you no show three or more times for appointments you may be dismissed from the clinic at the providers discretion.     Again, thank you for choosing [facility].  Our hope is that these requests will decrease the amount of time that you wait before being seen by our physicians.       _____________________________________________________________  Should you have questions after your visit to [facility], please contact our office at [phone] between the hours of 8:30 a.m. and 4:30 p.m.  Voicemails left after 4:30 p.m. will not be returned until the following business day.  For prescription refill requests, have your pharmacy contact our office.

## 2015-03-29 NOTE — Patient Instructions (Addendum)
..  [facility] Discharge Instructions for Patients Receiving Chemotherapy  Today you received the following chemotherapy agents oxaliplatin, leucovorin, 100f and avastin  Please make sure that she is taking calcium!!!  We are sending 2 urine cups home with you. Please collect one the morning of your next treatment and bring back at time of next treatment    If you develop nausea and vomiting, or diarrhea that is not controlled by your medication, call the clinic.  The clinic phone number is [phone] Office hours are Monday-Friday 8:30am-5:00pm.  BELOW ARE SYMPTOMS THAT SHOULD BE REPORTED IMMEDIATELY:  *FEVER GREATER THAN 101.0 F  *CHILLS WITH OR WITHOUT FEVER  NAUSEA AND VOMITING THAT IS NOT CONTROLLED WITH YOUR NAUSEA MEDICATION  *UNUSUAL SHORTNESS OF BREATH  *UNUSUAL BRUISING OR BLEEDING  TENDERNESS IN MOUTH AND THROAT WITH OR WITHOUT PRESENCE OF ULCERS  *URINARY PROBLEMS  *BOWEL PROBLEMS  UNUSUAL RASH Items with * indicate a potential emergency and should be followed up as soon as possible. If you have an emergency after office hours please contact your primary care physician or go to the nearest emergency department.  Please call the clinic during office hours if you have any questions or concerns.   You may also contact the Patient Navigator at [phone] you have any questions or need assistance in obtaining follow up care.

## 2015-03-31 ENCOUNTER — Encounter (HOSPITAL_COMMUNITY): Payer: Medicare Other

## 2015-03-31 ENCOUNTER — Encounter (HOSPITAL_BASED_OUTPATIENT_CLINIC_OR_DEPARTMENT_OTHER): Payer: Medicare Other

## 2015-03-31 VITALS — BP 165/74 | HR 92 | Temp 98.0°F | Resp 18

## 2015-03-31 DIAGNOSIS — Z5189 Encounter for other specified aftercare: Secondary | ICD-10-CM | POA: Diagnosis not present

## 2015-03-31 DIAGNOSIS — R911 Solitary pulmonary nodule: Secondary | ICD-10-CM

## 2015-03-31 DIAGNOSIS — E538 Deficiency of other specified B group vitamins: Secondary | ICD-10-CM

## 2015-03-31 DIAGNOSIS — C20 Malignant neoplasm of rectum: Secondary | ICD-10-CM | POA: Diagnosis not present

## 2015-03-31 DIAGNOSIS — C7800 Secondary malignant neoplasm of unspecified lung: Secondary | ICD-10-CM

## 2015-03-31 MED ORDER — PEGFILGRASTIM 6 MG/0.6ML ~~LOC~~ PSKT
PREFILLED_SYRINGE | SUBCUTANEOUS | Status: AC
  Filled 2015-03-31: qty 0.6

## 2015-03-31 MED ORDER — CYANOCOBALAMIN 1000 MCG/ML IJ SOLN
INTRAMUSCULAR | Status: AC
  Filled 2015-03-31: qty 1

## 2015-03-31 MED ORDER — HEPARIN SOD (PORK) LOCK FLUSH 100 UNIT/ML IV SOLN
INTRAVENOUS | Status: AC
  Filled 2015-03-31: qty 5

## 2015-03-31 MED ORDER — PEGFILGRASTIM 6 MG/0.6ML ~~LOC~~ PSKT
6.0000 mg | PREFILLED_SYRINGE | Freq: Once | SUBCUTANEOUS | Status: AC
  Administered 2015-03-31: 6 mg via SUBCUTANEOUS

## 2015-03-31 MED ORDER — CYANOCOBALAMIN 1000 MCG/ML IJ SOLN
1000.0000 ug | Freq: Once | INTRAMUSCULAR | Status: AC
  Administered 2015-03-31: 1000 ug via INTRAMUSCULAR

## 2015-03-31 MED ORDER — SODIUM CHLORIDE 0.9 % IJ SOLN
10.0000 mL | INTRAMUSCULAR | Status: DC | PRN
  Administered 2015-03-31: 10 mL
  Filled 2015-03-31: qty 10

## 2015-03-31 MED ORDER — HEPARIN SOD (PORK) LOCK FLUSH 100 UNIT/ML IV SOLN
500.0000 [IU] | Freq: Once | INTRAVENOUS | Status: AC | PRN
  Administered 2015-03-31: 500 [IU]

## 2015-03-31 NOTE — Progress Notes (Signed)
[name] presents today for injection per MD orders. B12 1052mg administered IM in left Upper Arm. Administration without incident. Patient tolerated well. HI chemo pump removed today.  Patient reports that she had no issues with the pump or treatment.  No side effects.

## 2015-03-31 NOTE — Patient Instructions (Signed)
[name] at [facility] Discharge Instructions  RECOMMENDATIONS MADE BY THE CONSULTANT AND ANY TEST RESULTS WILL BE SENT TO YOUR REFERRING PHYSICIAN.  We disconnected the chemo pump today.   Patient received B12 106mg IM in the left arm.   Neulasta OnPro '6mg'$  applied to right arm.  This can be removed at 6:30pm Saturday evening.    [facility] [place].  To afford each patient quality time with our provider, please arrive at least 15 minutes before your scheduled appointment time.    You need to re-schedule your appointment should you arrive 10 or more minutes late.  We strive to give you quality time with our providers, and arriving late affects you and other patients whose appointments are after yours.  Also, if you no show three or more times for appointments you may be dismissed from the clinic at the providers discretion.     [facility]  Our hope is that these requests will decrease the amount of time that you wait before being seen by our physicians.       _____________________________________________________________  Should you have [facility] please contact our office at [phone] between the hours of 8:30 a.m. and 4:30 p.m.  Voicemails left after 4:30 p.m. will not be returned until the following business day.  For prescription refill requests, have your pharmacy contact our office.

## 2015-04-04 ENCOUNTER — Telehealth (HOSPITAL_COMMUNITY): Payer: Self-pay | Admitting: *Deleted

## 2015-04-04 ENCOUNTER — Other Ambulatory Visit (HOSPITAL_COMMUNITY): Payer: Self-pay | Admitting: *Deleted

## 2015-04-04 DIAGNOSIS — C7800 Secondary malignant neoplasm of unspecified lung: Principal | ICD-10-CM

## 2015-04-04 DIAGNOSIS — C20 Malignant neoplasm of rectum: Secondary | ICD-10-CM

## 2015-04-04 MED ORDER — MAGIC MOUTHWASH W/LIDOCAINE: 5.0000 mL | Freq: Four times a day (QID) | ORAL | Status: DC

## 2015-04-04 MED ORDER — FLUCONAZOLE 100 MG PO TABS
100.0000 mg | ORAL_TABLET | Freq: Every day | ORAL | Status: DC
Start: 2015-04-04 — End: 2016-02-27

## 2015-04-04 NOTE — Telephone Encounter (Signed)
Nursing, please triage.  KEFALAS,THOMAS, PA-C 04/04/2015 9:50 AM

## 2015-04-09 NOTE — Progress Notes (Addendum)
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  Aranesp every 14 days at renal dose.   INTERVAL HISTORY: [name] 65 y.o. female returns for followup of Locally advanced adenocarcinoma of the rectum, She tolerated combined modality therapy well. Radiation therapy completed on 05/04/2013 which was also the last day she received Xeloda, excellent tolerance, status post APR on 07/26/2013 with marked cytoreduction documented as evidenced by no lymph node involvement. pT3 tumor was found. S/P 6 months of Xeloda at 1000 mg BID 2 weeks on and 1 week off finishing in December 2015.  Serial CT imaging demonstrated an enlarging pulmonary nodule resulting in LLL superior segmentectomy by Dr. Roxan Hockey on 10/26/2014 demonstrating oligometastatic disease of rectal adenocarcinoma, resulting in an upstage to Stage IV disease.    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   03/25/2013 - 05/05/2013 Chemotherapy Xeloda 1500 mg BID 5 days/week with radiation therapy.   07/26/2013 Definitive Surgery Dr. Arnoldo Morale- Flexible sigmoidoscopy, abdominoperineal resection, total abdominal hysterectomy with bilateral salpingo-oophorectomy   08/30/2013 - 03/01/2014 Chemotherapy Xeloda 1000 mg BID 14 days on and 7 days off x 6 months   03/08/2014 Imaging CT CAP- Bilateral pulmonary nodules, including an 8 mm left lower lobe nodule. Metastatic disease is a concern. The left lower lobe nodule has progressed since 03/05/2013, when it measured 5 mm.   05/26/2014 Imaging CT Chest- Continued enlargement of the superior segment left lower lobe nodule. Malignancy is likely.   In  contrast, the right lower lobe nodule is probably benign.   08/17/2014 Imaging CT- Superior segment left lower lobe pulmonary nodule measures stable since the most recent comparison study, but is again noted to have increased in size when comparing to older studies. Continued close attention will be required as neoplasm remains a con   10/26/2014 Surgery thoracoscopic left lower lobe superior segmentectomy. Pathology with metastatic adenocarcinoma c/w colonic primary   01/04/2015 -  Chemotherapy FOLFOX+Avastin (Avastin started on 01/18/2015).   01/09/2015 Imaging CT CAP- Interval wedge resection of metastasis within the left lower lobe. No evidence of new metastatic disease within the chest, abdomen or pelvis.   02/15/2015 Treatment Plan Change Treatment deferred due to renal function change (patient poor historian) with N/V.   02/22/2015 Treatment Plan Change Added Aranesp at renal dosing to supportive therpay plan.   03/08/2015 Treatment Plan Change Defer treatment x 1 week   04/12/2015 Adverse Reaction Patient reported mouth sores.  None on exam.     04/12/2015 Treatment Plan Change 5FU bolus is discontinued for "mouth sores"   04/12/2015 Adverse Reaction Increasing Creatinine Cl.  CrCl calculated to be 30.7.     04/12/2015 Treatment Plan Change Oxaliplatin dose reduced by 20% to 65 mg/m2 based upon creatinine clearance.    I personally reviewed and went over laboratory results with the patient.  The results are noted within this dictation.  Labs will be updated today.     Patient reports nausea that is resolved with home anti-emetics.  She denies any vomiting.  She notes that her nausea is more prevalent the week prior to chemotherapy.  She admits that her home anti-emetics work, but the  patient's caregiver reports that she rarely asks for her antiemetics.  [name] reports nausea more frequently than she is asking for anti-emetics.  Patient is educated that she is to ask for nausea medicine when  needed.  She notes some dizziness.  Very nonspecific.  Caregiver notes it most when she is standing.  She reports that it occurs most when she first stands.  [name] reports some dizziness when sitting too.  She does not drink much in way of caffeine beverages.  She does not drink coffee.  She rarely has a soda.  [name] says that she drinks 2 bottles of H2O daily.  She is encouraged to increase her H2O intake.  She notes mouth sores.  She reports that she had 1.  She denies it interfering with her food consumption, but the patient's caregiver notes that she does not finish her meals and her facial expression show that she may be uncomfortable when eating.  She denies any nausea that is preventing her from eating.  She notes that her mouth sore has resolved.  Exam is benign.  She is using [employer]'s Mouthwash with Lidocaine.  She gets it about 10 minutes prior to eating.  She is educated on taking this medication closer to meal consumption.   Past Medical History  Diagnosis Date  . Hypertension   . Diabetes mellitus     years  . Asthma   . Depression   . Mental retardation     stopped school at 9th grade   . History of recurrent UTIs   . Shortness of breath     with exertion  . Cancer (HCC)     Rectal   . Arthritis   . Gout   . Seizures ([place])     more than 4 yrs since last seizure. UNknown etiology  . Rectal cancer ([place])   . Iron deficiency 04/27/2014  . Chronic renal disease, stage 3, moderately decreased glomerular filtration rate between 30-59 mL/min/1.73 square meter 04/27/2014  . Numbness and tingling in hands     x several months   . GERD (gastroesophageal reflux disease)   . Vitamin B 12 deficiency 02/01/2015    Incidentally found without antibody testing.      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     has No Known Allergies.  Current Outpatient Prescriptions on File Prior to Visit  Medication Sig Dispense Refill  . amLODipine (NORVASC) 10 MG tablet Take 1 tablet (10 mg total) by mouth every morning. 30 tablet 1  . aspirin EC 81 MG tablet Take 81 mg by mouth daily.    . calcium-vitamin D (OSCAL WITH D) 500-200 MG-UNIT tablet Take 2 tablets by mouth daily with breakfast. 60 tablet 5  . dextrose 5 % SOLN 1,000 mL with fluorouracil 5 GM/100ML SOLN Inject into the vein. Every 14 days over 46 hours    . diphenhydrAMINE (BENADRYL) 25 MG tablet Take 1 tablet (25 mg total) by mouth every  6 (six) hours. (Patient not taking: Reported on 02/01/2015) 14 tablet 0  . docusate sodium (COLACE) 100 MG capsule Take 100 mg by mouth 2 (two) times daily.    . Emollient (CERAVE) LOTN Apply 1 application topically daily. Apply to both feet    . famotidine (PEPCID) 20 MG tablet Take 1 tablet (20 mg total) by mouth 2 (two) times daily. 14 tablet 0  . ferrous sulfate 325 (65 FE) MG tablet TAKE (1) TABLET BY MOUTH TWICE DAILY. 60 tablet 4  . fish oil-omega-3 fatty acids 1000 MG capsule Take 1 g by mouth 2 (two) times daily.    . fluconazole (DIFLUCAN) 100 MG tablet Take 1 tablet (100 mg total) by mouth daily. 10 tablet 0  . Fluticasone-Salmeterol (ADVAIR) 250-50 MCG/DOSE AEPB Inhale 1 puff into the lungs every 12 (twelve) hours.    [name] glucose blood (ACCU-CHEK AVIVA PLUS) test  strip 1 each by Other route daily. Use as instructed    . HYDROcodone-acetaminophen (NORCO/VICODIN) 5-325 MG tablet Take 1 tablet by mouth every 4 (four) hours as needed for moderate pain. For pain not relieved by tramadol (Patient not taking: Reported on 01/10/2015) 40 tablet 0  . ibuprofen (ADVIL,MOTRIN) 600 MG tablet Take 600 mg by mouth every 6 (six) hours as needed for moderate pain.    . Insulin Glargine (LANTUS SOLOSTAR) 100 UNIT/ML SOPN Inject 12 Units into the skin at bedtime.     [name] ipratropium-albuterol (DUONEB) 0.5-2.5 (3) MG/3ML SOLN     . leucovorin 50 MG injection Inject into the vein. Every 14 days    . lidocaine-prilocaine (EMLA) cream Apply a quarter size amount to port site 1 hour prior to chemo. Do not rub in. Cover with plastic wrap. 30 g 3  . lisinopril (PRINIVIL,ZESTRIL) 10 MG tablet Take 1 tablet (10 mg total) by mouth daily. 30 tablet 1  . loperamide (IMODIUM) 2 MG capsule Take 2 caps after 1st loose stool and then 1 cap q2h until 12 hours have passed without having a loose stool. At bedtime, take 2 caps. Then 2 caps q4 h until morning. (Patient not taking: Reported on 02/15/2015) 45 capsule 0  . loratadine-pseudoephedrine (LORATADINE-D 24HR) 10-240 MG per 24 hr tablet Take 1 tablet by mouth daily.    . magic mouthwash w/lidocaine SOLN Take 5 mLs by mouth 4 (four) times daily. 360 mL 1  . metFORMIN (GLUCOPHAGE) 1000 MG tablet Take 1,000 mg by mouth 2 (two) times daily with a meal.    . metoprolol succinate (TOPROL-XL) 50 MG 24 hr tablet Take 100 mg by mouth at bedtime.     . montelukast (SINGULAIR) 10 MG tablet Take 10 mg by mouth at bedtime.    . ondansetron (ZOFRAN) 8 MG tablet Take 1 tablet (8 mg total) by mouth every 8 (eight) hours as needed for nausea or vomiting. #1 nausea med to take 30 tablet 2  . OXALIPLATIN IV Inject into the vein every 14 (fourteen) days. Starting 10/19    . pantoprazole (PROTONIX) 40 MG tablet Take 40 mg by mouth every morning.     .  Pegfilgrastim (NEULASTA ONPRO [place]) Inject into the skin. Every 14 days    . phenytoin (DILANTIN) 100 MG ER capsule Take 100 mg by mouth 2 (two) times daily.    . pravastatin (PRAVACHOL) 40 MG tablet Take 40 mg by mouth every evening.     [name] PROAIR HFA 108 (90 BASE) MCG/ACT inhaler     . prochlorperazine (COMPAZINE) 10 MG  tablet Take 1 tablet (10 mg total) by mouth every 6 (six) hours as needed for nausea or vomiting. #2 nausea med to take (Patient not taking: Reported on 03/08/2015) 30 tablet 2  . silver sulfADIAZINE (SILVADENE) 1 % cream Apply 1 application topically every morning.     . sodium chloride (OCEAN) 0.65 % SOLN nasal spray Place 2 sprays into both nostrils 4 (four) times daily as needed for congestion.    . traMADol (ULTRAM) 50 MG tablet Take 50 mg by mouth every 4 (four) hours as needed for moderate pain.     . traZODone (DESYREL) 50 MG tablet     . triamcinolone cream (KENALOG) 0.1 % Apply 1 application topically 2 (two) times daily. To stoma     No current facility-administered medications on file prior to visit.    Past Surgical History  Procedure Laterality Date  . Abdominal hysterectomy    . Multiple extractions with alveoloplasty N/A 11/23/2012    Procedure: MULTIPLE EXTRACION 1, 2, 4, 5, 6, 7, 8, 9, 10, 11, 12, 13, 14, 17, 18, 20, 23, 24, 25, 26, 28, 29, 32 WITH ALVEOLOPLASTY, REMOVE BILATERAL TORI;  Surgeon: Gae Bon, DDS;  Location: [place];  Service: Oral Surgery;  Laterality: N/A;  . Colonoscopy with esophagogastroduodenoscopy (egd) N/A 01/14/2013    Procedure: COLONOSCOPY WITH ESOPHAGOGASTRODUODENOSCOPY (EGD);  Surgeon: Rogene Houston, MD;  [facility];  Service: Endoscopy;  Laterality: N/A;  250-moved to 315 Ann to notify pt  . Colonoscopy N/A 02/04/2013    Procedure: COLONOSCOPY;  Surgeon: Rogene Houston, MD;  [facility];  Service: Endoscopy;  Laterality: N/A;  225  . Eus N/A 02/18/2013    Procedure: LOWER ENDOSCOPIC ULTRASOUND (EUS);   Surgeon: Milus Banister, MD;  Location: [facility];  Service: Endoscopy;  Laterality: N/A;  . Flexible sigmoidoscopy N/A 07/26/2013    Procedure: FLEXIBLE SIGMOIDOSCOPY;  Surgeon: Jamesetta So, MD;  Location: [facility];  Service: General;  Laterality: N/A;  . Abdominal perineal bowel resection N/A 07/26/2013    Procedure:  ABDOMINAL PERINEAL RESECTION;  Surgeon: Jamesetta So, MD;  Location: [facility];  Service: General;  Laterality: N/A;  . Supracervical abdominal hysterectomy N/A 07/26/2013    Procedure: HYSTERECTOMY SUPRACERVICAL ABDOMINAL ;  Surgeon: Jamesetta So, MD;  Location[facility];  Service: General;  Laterality: N/A;  . Salpingoophorectomy Bilateral 07/26/2013    Procedure: SALPINGO OOPHORECTOMY;  Surgeon: Jamesetta So, MD;  Location[facility];  Service: General;  Laterality: Bilateral;  . Colostomy Left 07/26/2013    Procedure: COLOSTOMY;  Surgeon: Jamesetta So, MD;  [facility];  Service: General;  Laterality: Left;  . Colonoscopy N/A 05/12/2014    Procedure: COLONOSCOPY;  Surgeon: Rogene Houston, MD;  [facility];  Service: Endoscopy;  Laterality: N/A;  1030  . Colon surgery  2015    bowel resection/w colostomy  . Video assisted thoracoscopy Left 10/26/2014    Procedure: LEFT VIDEO ASSISTED THORACOSCOPY;  Surgeon: [name], MD;  Location: [place];  Service: Thoracic;  Laterality: Left;  . Segmentecomy Left 10/26/2014    Procedure: LEFT LOWER LOBE SUPERIOR SEGMENTECTOMY;  Surgeon: [name], MD;  Location: [place];  Service: Thoracic;  Laterality: Left;  . Lymph node dissection Left 10/26/2014    Procedure: LYMPH NODE DISSECTION;  Surgeon: [name], MD;  Location: [place];  Service: Thoracic;  Laterality: Left;  . Portacath placement Left 01/02/2015  Procedure: INSERTION PORT-A-CATH;  Surgeon: Aviva Signs, MD;  Location: [facility];  Service: General;  Laterality: Left;    Denies any headaches, dizziness, double vision, fevers, chills,  night sweats, vomiting, diarrhea, constipation, chest pain, heart palpitations, shortness of breath, blood in stool, black tarry stool, urinary pain, urinary burning, urinary frequency, hematuria.   PHYSICAL EXAMINATION  ECOG PERFORMANCE STATUS: 2 - Symptomatic, <50% confined to bed  Filed Vitals:   04/12/15 0932  BP: 152/59  Pulse: 88  Temp: 98.2 F (36.8 C)  Resp: 18    GENERAL:alert, no distress, well nourished, comfortable, cooperative, obese, smiling and accompanied today and in a wheelchair SKIN: skin color, texture, turgor are normal. HEAD: Normocephalic, No masses, lesions, tenderness or abnormalities EYES: normal, PERRLA, EOMI, Conjunctiva are pink and non-injected EARS: External ears normal OROPHARYNX:lips, buccal mucosa, and tongue normal and mucous membranes are moist  NECK: supple, no adenopathy, trachea midline LYMPH:  no palpable lymphadenopathy BREAST:not examined LUNGS: clear to auscultation  HEART: regular rate & rhythm ABDOMEN:abdomen soft, non-tender, obese and normal bowel sounds BACK: Back symmetric, no curvature., No CVA tenderness EXTREMITIES:less then 2 second capillary refill, no joint deformities, effusion, or inflammation, no skin discoloration, no cyanosis  NEURO: no focal motor/sensory deficits, gait normal   LABORATORY DATA: CBC    Component Value Date/Time   WBC 12.9* 04/12/2015 0923   RBC 3.20* 04/12/2015 0923   RBC 3.84* 11/07/2009 1649   HGB 9.4* 04/12/2015 0923   HCT 29.3* 04/12/2015 0923   PLT 147* 04/12/2015 0923   MCV 91.6 04/12/2015 0923   MCH 29.4 04/12/2015 0923   MCHC 32.1 04/12/2015 0923   RDW 17.3* 04/12/2015 0923   LYMPHSABS 0.9 04/12/2015 0923   MONOABS 1.1* 04/12/2015 0923   EOSABS 0.2 04/12/2015 0923   BASOSABS 0.0 04/12/2015 0923      Chemistry      Component Value Date/Time   NA 132* 04/12/2015 0923   K 5.2* 04/12/2015 0923   CL 102 04/12/2015 0923   CO2 22 04/12/2015 0923   BUN 36* 04/12/2015 0923    CREATININE 1.62* 04/12/2015 0923   CREATININE 1.11* 02/08/2013 1451      Component Value Date/Time   CALCIUM 8.1* 04/12/2015 0923   ALKPHOS 165* 04/12/2015 0923   AST 23 04/12/2015 0923   ALT 26 04/12/2015 0923   BILITOT 0.3 04/12/2015 0923      Lab Results  Component Value Date   CEA 4.5 01/04/2015   Lab Results  Component Value Date   FERRITIN 135 03/08/2015     PENDING LABS:   RADIOGRAPHIC STUDIES:  No results found.   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 Roxan Hockey on 10/26/2014 demonstrating recurrent Stage IV disease after undergoing Xeloda+XRT finishing on 05/04/2013, followed by APR on 07/26/2013 by Dr. Arnoldo Morale with marked cytoreduction documented, followed by 6 months worth of Xeloda 1000 mg BID 2 weeks on and 1 week off x 6 months.  Surveillance per NCCN guidelines was followed with serial CT imaging of chest demonstrating an enlarging pulmonary nodule as mentioned previously.  Now on systemic chemotherapy consisting of FOLFOX + Avastin beginning on 01/04/2015.  Oncology history is up-to-date.  Pre-treatment labs today.    Aranesp today as planned at renal dosing.    Patient reported mouth sores.  She reports one.  Very nonspecific on location and whether it interferes with her  ability to eat.  She is on [employer]'s Mouthwash.  She and her caregiver is educated on proper administration of the medication immediately prior to food consumption as the lidocaine only lasts about 10-20 minutes.  I will D/C 5FU bolus from treatment plan after conversing with Dr. Whitney Muse for approval.  Patient notes intermittent dizziness.  No LOC or falls.  Poor historian and nonspecific without much detail and conflicting information according to the patient's caregiver.  I suspect this is orthostatic hypotension with poor H2O intake based upon  answers to my questions.  She denies any PN symptoms.  She notes intermittent nausea and home antiemetics work at aborting.  However, caregiver reports that she does not ask for antiemetics often and therefore, not given often.  She is educated that she can ask for anti-nausea medication.  She denies any vomiting.   Return in 2 weeks for follow-up and next treatment.  Pre-treatment labs demonstrate an increasing Creatinine.  As a result, her calculated CrCl is 30.7.  Based upon Oxaliplatin PI, dose should be reduced for CrCl less than 30.  Will reduce Oxaliplatin dose by 20% to 65 mg/m2.  Treatment plan and oncology history updated accordingly.  Anemia Anemia of chronic disease, started on Aranesp renal dose every 2 weeks beginning on 02/22/2015.  Ferritin level should be drawn today.    Vitamin B 12 deficiency On monthly B12 injection.  No antibody testing done.  Following completion of chemotherapy, we could consider PO/SL B12 replacement.  For the time being, we will continue with monthly B12 injections coordinated with her treatment dates.  Next B12 injection is due in 2 weeks.   THERAPY PLAN:  Continue with adjuvant FOLFOX + Avastin.    All questions were answered. The patient knows to call the clinic with any problems, questions or concerns. We can certainly see the patient much sooner if necessary.  Patient and plan discussed with Dr. Ancil Linsey and she is in agreement with the aforementioned.   This note is electronically signed by: Robynn Pane, PA-C 04/12/2015 11:11 AM

## 2015-04-09 NOTE — Assessment & Plan Note (Addendum)
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 Roxan Hockey on 10/26/2014 demonstrating recurrent Stage IV disease after undergoing Xeloda+XRT finishing on 05/04/2013, followed by APR on 07/26/2013 by Dr. Arnoldo Morale with marked cytoreduction documented, followed by 6 months worth of Xeloda 1000 mg BID 2 weeks on and 1 week off x 6 months.  Surveillance per NCCN guidelines was followed with serial CT imaging of chest demonstrating an enlarging pulmonary nodule as mentioned previously.  Now on systemic chemotherapy consisting of FOLFOX + Avastin beginning on 01/04/2015.  Oncology history is up-to-date.  Pre-treatment labs today.    Aranesp today as planned at renal dosing.    Patient reported mouth sores.  She reports one.  Very nonspecific on location and whether it interferes with her ability to eat.  She is on [employer]'s Mouthwash.  She and her caregiver is educated on proper administration of the medication immediately prior to food consumption as the lidocaine only lasts about 10-20 minutes.  I will D/C 5FU bolus from treatment plan after conversing with Dr. Whitney Muse for approval.  Patient notes intermittent dizziness.  No LOC or falls.  Poor historian and nonspecific without much detail and conflicting information according to the patient's caregiver.  I suspect this is orthostatic hypotension with poor H2O intake based upon answers to my questions.  She denies any PN symptoms.  She notes intermittent nausea and home antiemetics work at aborting.  However, caregiver reports that she does not ask for antiemetics often and therefore, not given often.  She is educated that she can ask for anti-nausea medication.  She denies any vomiting.   Return in 2 weeks for follow-up and next treatment.  Pre-treatment labs demonstrate an increasing Creatinine.  As a result, her calculated CrCl is 30.7.  Based upon Oxaliplatin PI, dose should be reduced for CrCl  less than 30.  Will reduce Oxaliplatin dose by 20% to 65 mg/m2.  Treatment plan and oncology history updated accordingly.

## 2015-04-09 NOTE — Assessment & Plan Note (Signed)
Anemia of chronic disease, started on Aranesp renal dose every 2 weeks beginning on 02/22/2015.  Ferritin level should be drawn today.

## 2015-04-09 NOTE — Assessment & Plan Note (Addendum)
On monthly B12 injection.  No antibody testing done.  Following completion of chemotherapy, we could consider PO/SL B12 replacement.  For the time being, we will continue with monthly B12 injections coordinated with her treatment dates.  Next B12 injection is due in 2 weeks.

## 2015-04-12 ENCOUNTER — Encounter (HOSPITAL_BASED_OUTPATIENT_CLINIC_OR_DEPARTMENT_OTHER): Payer: Medicare Other | Admitting: Oncology

## 2015-04-12 ENCOUNTER — Encounter (HOSPITAL_BASED_OUTPATIENT_CLINIC_OR_DEPARTMENT_OTHER): Payer: Medicare Other

## 2015-04-12 ENCOUNTER — Encounter (HOSPITAL_COMMUNITY): Payer: Self-pay | Admitting: Oncology

## 2015-04-12 VITALS — BP 149/65 | HR 81 | Temp 98.6°F | Resp 18

## 2015-04-12 VITALS — BP 152/59 | HR 88 | Temp 98.2°F | Resp 18 | Wt 230.4 lb

## 2015-04-12 DIAGNOSIS — C20 Malignant neoplasm of rectum: Secondary | ICD-10-CM | POA: Diagnosis not present

## 2015-04-12 DIAGNOSIS — C7800 Secondary malignant neoplasm of unspecified lung: Secondary | ICD-10-CM | POA: Diagnosis not present

## 2015-04-12 DIAGNOSIS — E538 Deficiency of other specified B group vitamins: Secondary | ICD-10-CM

## 2015-04-12 DIAGNOSIS — N189 Chronic kidney disease, unspecified: Secondary | ICD-10-CM

## 2015-04-12 DIAGNOSIS — D631 Anemia in chronic kidney disease: Secondary | ICD-10-CM

## 2015-04-12 DIAGNOSIS — R911 Solitary pulmonary nodule: Secondary | ICD-10-CM

## 2015-04-12 DIAGNOSIS — Z5112 Encounter for antineoplastic immunotherapy: Secondary | ICD-10-CM | POA: Diagnosis not present

## 2015-04-12 DIAGNOSIS — Z5111 Encounter for antineoplastic chemotherapy: Secondary | ICD-10-CM

## 2015-04-12 LAB — URINALYSIS, DIPSTICK ONLY
Bilirubin Urine: NEGATIVE
Glucose, UA: NEGATIVE mg/dL
Hgb urine dipstick: NEGATIVE
KETONES UR: NEGATIVE mg/dL
LEUKOCYTES UA: NEGATIVE
NITRITE: NEGATIVE
PROTEIN: NEGATIVE mg/dL
Specific Gravity, Urine: <1.005 — ABNORMAL LOW (ref 1.005–1.030)
pH: 5 (ref 5.0–8.0)

## 2015-04-12 LAB — COMPREHENSIVE METABOLIC PANEL
ALBUMIN: 3.3 g/dL — AB (ref 3.5–5.0)
ALT: 26 U/L (ref 14–54)
ANION GAP: 8 (ref 5–15)
AST: 23 U/L (ref 15–41)
Alkaline Phosphatase: 165 U/L — ABNORMAL HIGH (ref 38–126)
BILIRUBIN TOTAL: 0.3 mg/dL (ref 0.3–1.2)
BUN: 36 mg/dL — ABNORMAL HIGH (ref 6–20)
CALCIUM: 8.1 mg/dL — AB (ref 8.9–10.3)
CO2: 22 mmol/L (ref 22–32)
Chloride: 102 mmol/L (ref 101–111)
Creatinine, Ser: 1.62 mg/dL — ABNORMAL HIGH (ref 0.44–1.00)
GFR calc non Af Amer: 33 mL/min — ABNORMAL LOW (ref >60)
GFR, EST AFRICAN AMERICAN: 38 mL/min — AB (ref >60)
GLUCOSE: 214 mg/dL — AB (ref 65–99)
POTASSIUM: 5.2 mmol/L — AB (ref 3.5–5.1)
SODIUM: 132 mmol/L — AB (ref 135–145)
TOTAL PROTEIN: 6.5 g/dL (ref 6.5–8.1)

## 2015-04-12 LAB — CBC WITH DIFFERENTIAL/PLATELET
BASOS PCT: 0 %
Basophils Absolute: 0 10*3/uL (ref 0.0–0.1)
EOS ABS: 0.2 10*3/uL (ref 0.0–0.7)
Eosinophils Relative: 1 %
HCT: 29.3 % — ABNORMAL LOW (ref 36.0–46.0)
Hemoglobin: 9.4 g/dL — ABNORMAL LOW (ref 12.0–15.0)
LYMPHS ABS: 0.9 10*3/uL (ref 0.7–4.0)
Lymphocytes Relative: 7 %
MCH: 29.4 pg (ref 26.0–34.0)
MCHC: 32.1 g/dL (ref 30.0–36.0)
MCV: 91.6 fL (ref 78.0–100.0)
MONO ABS: 1.1 10*3/uL — AB (ref 0.1–1.0)
MONOS PCT: 9 %
Neutro Abs: 10.7 10*3/uL — ABNORMAL HIGH (ref 1.7–7.7)
Neutrophils Relative %: 83 %
Platelets: 147 10*3/uL — ABNORMAL LOW (ref 150–400)
RBC: 3.2 MIL/uL — ABNORMAL LOW (ref 3.87–5.11)
RDW: 17.3 % — AB (ref 11.5–15.5)
WBC: 12.9 10*3/uL — ABNORMAL HIGH (ref 4.0–10.5)

## 2015-04-12 LAB — FERRITIN: Ferritin: 291 ng/mL (ref 11–307)

## 2015-04-12 MED ORDER — SODIUM CHLORIDE 0.9 % IV SOLN
2400.0000 mg/m2 | INTRAVENOUS | Status: DC
  Administered 2015-04-12: 5200 mg via INTRAVENOUS
  Filled 2015-04-12: qty 104

## 2015-04-12 MED ORDER — DARBEPOETIN ALFA 100 MCG/0.5ML IJ SOSY
PREFILLED_SYRINGE | INTRAMUSCULAR | Status: AC
  Filled 2015-04-12: qty 0.5

## 2015-04-12 MED ORDER — LEUCOVORIN CALCIUM INJECTION 100 MG
20.0000 mg/m2 | Freq: Once | INTRAMUSCULAR | Status: AC
  Administered 2015-04-12: 44 mg via INTRAVENOUS
  Filled 2015-04-12: qty 2.2

## 2015-04-12 MED ORDER — SODIUM CHLORIDE 0.9 % IV SOLN
5.0000 mg/kg | Freq: Once | INTRAVENOUS | Status: AC
  Administered 2015-04-12: 525 mg via INTRAVENOUS
  Filled 2015-04-12: qty 16

## 2015-04-12 MED ORDER — SODIUM CHLORIDE 0.9 % IV SOLN
Freq: Once | INTRAVENOUS | Status: AC
  Administered 2015-04-12: 11:00:00 via INTRAVENOUS

## 2015-04-12 MED ORDER — SODIUM CHLORIDE 0.9 % IJ SOLN: 10.0000 mL | INTRAMUSCULAR | Status: DC | PRN

## 2015-04-12 MED ORDER — DARBEPOETIN ALFA 100 MCG/0.5ML IJ SOSY
80.0000 ug | PREFILLED_SYRINGE | Freq: Once | INTRAMUSCULAR | Status: AC
  Administered 2015-04-12: 80 ug via SUBCUTANEOUS

## 2015-04-12 MED ORDER — DEXTROSE 5 % IV SOLN: 400.0000 mg/m2 | Freq: Once | INTRAVENOUS | Status: DC

## 2015-04-12 MED ORDER — SODIUM CHLORIDE 0.9 % IV SOLN
Freq: Once | INTRAVENOUS | Status: AC
  Administered 2015-04-12: 12:00:00 via INTRAVENOUS
  Filled 2015-04-12: qty 4

## 2015-04-12 MED ORDER — DEXTROSE 5 % IV SOLN
Freq: Once | INTRAVENOUS | Status: AC
  Administered 2015-04-12: 13:00:00 via INTRAVENOUS

## 2015-04-12 MED ORDER — OXALIPLATIN CHEMO INJECTION 100 MG/20ML
65.4500 mg/m2 | Freq: Once | INTRAVENOUS | Status: AC
  Administered 2015-04-12: 140 mg via INTRAVENOUS
  Filled 2015-04-12: qty 28

## 2015-04-12 NOTE — Progress Notes (Signed)
Tolerated infusion well. CI 50f infusing at 5.4 cc/hr.

## 2015-04-12 NOTE — Addendum Note (Signed)
Addended by: Baird Cancer on: 04/12/2015 11:11 AM   Modules accepted: Level of Service

## 2015-04-12 NOTE — Patient Instructions (Signed)
..  [facility] Discharge Instructions for Patients Receiving Chemotherapy   Beginning January 23rd 2017 lab work for the [facility] will be done in the  Main lab at [facility] on 1st floor. If you have a lab appointment with the [facility] please come in thru the  Main Entrance and check in at the main information desk   Today you received the following chemotherapy agents oxaliplatin, 66f, leucovorin, and avastin    If you develop nausea and vomiting, or diarrhea that is not controlled by your medication, call the clinic.  The clinic phone number is [phone] Office hours are Monday-Friday 8:30am-5:00pm.  BELOW ARE SYMPTOMS THAT SHOULD BE REPORTED IMMEDIATELY:  *FEVER GREATER THAN 101.0 F  *CHILLS WITH OR WITHOUT FEVER  NAUSEA AND VOMITING THAT IS NOT CONTROLLED WITH YOUR NAUSEA MEDICATION  *UNUSUAL SHORTNESS OF BREATH  *UNUSUAL BRUISING OR BLEEDING  TENDERNESS IN MOUTH AND THROAT WITH OR WITHOUT PRESENCE OF ULCERS  *URINARY PROBLEMS  *BOWEL PROBLEMS  UNUSUAL RASH Items with * indicate a potential emergency and should be followed up as soon as possible. If you have an emergency after office hours please contact your primary care physician or go to the nearest emergency department.  Please call the clinic during office hours if you have any questions or concerns.   You may also contact the Patient Navigator at [phone] you have any questions or need assistance in obtaining follow up care.

## 2015-04-12 NOTE — Patient Instructions (Signed)
[place] at [facility] -  Discharge Instructions  RECOMMENDATIONS MADE BY THE CONSULTANT AND ANY TEST RESULTS WILL BE SENT TO YOUR REFERRING PHYSICIAN.  Exam and discussion with Kirby Crigler, PA. Chemotherapy today - 5FU bolus discontinued. Use [employer]'s mouthwash IMMEDIATELY prior to eating. Increase water intake to stay hydrated. Office visit in 2 weeks with next treatment.   Thank you for choosing [place] at [facility] to provide your oncology and hematology care.  To afford each patient quality time with our provider, please arrive at least 15 minutes before your scheduled appointment time.   Beginning January 23rd 2017 lab work for the [facility] will be done in the  Main lab at [facility] on 1st floor. If you have a lab appointment with the [facility] please come in thru the  Main Entrance and check in at the main information desk  You need to re-schedule your appointment should you arrive 10 or more minutes late.  We strive to give you quality time with our providers, and arriving late affects you and other patients whose appointments are after yours.  Also, if you no show three or more times for appointments you may be dismissed from the clinic at the providers discretion.     Again, thank you for choosing [facility].  Our hope is that these requests will decrease the amount of time that you wait before being seen by our physicians.       _____________________________________________________________  Should you have questions after your visit to [facility], please contact our office at [phone] between the hours of 8:30 a.m. and 4:30 p.m.  Voicemails left after 4:30 p.m. will not be returned until the following business day.  For prescription refill requests, have your pharmacy contact our office.

## 2015-04-13 ENCOUNTER — Other Ambulatory Visit (HOSPITAL_COMMUNITY): Payer: Self-pay | Admitting: Oncology

## 2015-04-13 DIAGNOSIS — E875 Hyperkalemia: Secondary | ICD-10-CM

## 2015-04-14 ENCOUNTER — Encounter (HOSPITAL_BASED_OUTPATIENT_CLINIC_OR_DEPARTMENT_OTHER): Payer: Medicare Other

## 2015-04-14 ENCOUNTER — Encounter (HOSPITAL_COMMUNITY): Payer: Self-pay

## 2015-04-14 VITALS — BP 150/68 | HR 94 | Temp 98.6°F | Resp 18

## 2015-04-14 DIAGNOSIS — Z5189 Encounter for other specified aftercare: Secondary | ICD-10-CM | POA: Diagnosis not present

## 2015-04-14 DIAGNOSIS — C20 Malignant neoplasm of rectum: Secondary | ICD-10-CM | POA: Diagnosis not present

## 2015-04-14 DIAGNOSIS — C7800 Secondary malignant neoplasm of unspecified lung: Secondary | ICD-10-CM

## 2015-04-14 DIAGNOSIS — R911 Solitary pulmonary nodule: Secondary | ICD-10-CM

## 2015-04-14 MED ORDER — HEPARIN SOD (PORK) LOCK FLUSH 100 UNIT/ML IV SOLN
INTRAVENOUS | Status: AC
  Filled 2015-04-14: qty 5

## 2015-04-14 MED ORDER — HEPARIN SOD (PORK) LOCK FLUSH 100 UNIT/ML IV SOLN
500.0000 [IU] | Freq: Once | INTRAVENOUS | Status: AC | PRN
  Administered 2015-04-14: 500 [IU]

## 2015-04-14 MED ORDER — SODIUM CHLORIDE 0.9 % IJ SOLN
10.0000 mL | INTRAMUSCULAR | Status: DC | PRN
  Administered 2015-04-14: 10 mL
  Filled 2015-04-14: qty 10

## 2015-04-14 MED ORDER — PEGFILGRASTIM 6 MG/0.6ML ~~LOC~~ PSKT
6.0000 mg | PREFILLED_SYRINGE | Freq: Once | SUBCUTANEOUS | Status: AC
  Administered 2015-04-14: 6 mg via SUBCUTANEOUS

## 2015-04-14 MED ORDER — PEGFILGRASTIM 6 MG/0.6ML ~~LOC~~ PSKT
PREFILLED_SYRINGE | SUBCUTANEOUS | Status: AC
  Filled 2015-04-14: qty 0.6

## 2015-04-14 NOTE — Progress Notes (Signed)
..  [name] returns today for port de access and flush after 46 hr continous infusion of 75f. Tolerated infusion without problems. Portacath located rt chest wall was  deaccessed and flushed with 264mNS and 500U/70m23meparin and needle removed intact.  Procedure without incident. Patient tolerated procedure well. ....Jennifer KitchenMarland KitchenKipp Laurencerived today for ONPRO neulasta on body injector. See MAR for administration details. Injector in place and engaged with green light indicator on flashing. Tolerated application with out problems.

## 2015-04-17 ENCOUNTER — Other Ambulatory Visit (HOSPITAL_COMMUNITY): Payer: Self-pay | Admitting: Oncology

## 2015-04-17 ENCOUNTER — Encounter (HOSPITAL_COMMUNITY): Payer: Medicare Other

## 2015-04-17 DIAGNOSIS — E875 Hyperkalemia: Secondary | ICD-10-CM

## 2015-04-17 DIAGNOSIS — C20 Malignant neoplasm of rectum: Secondary | ICD-10-CM | POA: Diagnosis not present

## 2015-04-17 LAB — COMPREHENSIVE METABOLIC PANEL
ALBUMIN: 3.4 g/dL — AB (ref 3.5–5.0)
ALT: 20 U/L (ref 14–54)
ANION GAP: 11 (ref 5–15)
AST: 23 U/L (ref 15–41)
Alkaline Phosphatase: 166 U/L — ABNORMAL HIGH (ref 38–126)
BILIRUBIN TOTAL: 0.2 mg/dL — AB (ref 0.3–1.2)
BUN: 34 mg/dL — ABNORMAL HIGH (ref 6–20)
CHLORIDE: 98 mmol/L — AB (ref 101–111)
CO2: 23 mmol/L (ref 22–32)
Calcium: 8.4 mg/dL — ABNORMAL LOW (ref 8.9–10.3)
Creatinine, Ser: 1.56 mg/dL — ABNORMAL HIGH (ref 0.44–1.00)
GFR calc Af Amer: 40 mL/min — ABNORMAL LOW (ref >60)
GFR, EST NON AFRICAN AMERICAN: 34 mL/min — AB (ref >60)
GLUCOSE: 137 mg/dL — AB (ref 65–99)
POTASSIUM: 5.6 mmol/L — AB (ref 3.5–5.1)
Sodium: 132 mmol/L — ABNORMAL LOW (ref 135–145)
TOTAL PROTEIN: 6.8 g/dL (ref 6.5–8.1)

## 2015-04-17 MED ORDER — SODIUM POLYSTYRENE SULFONATE PO POWD: Freq: Once | ORAL | Status: DC

## 2015-04-20 ENCOUNTER — Other Ambulatory Visit (HOSPITAL_COMMUNITY): Payer: Self-pay | Admitting: Hematology & Oncology

## 2015-04-25 NOTE — Assessment & Plan Note (Addendum)
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 Roxan Hockey on 10/26/2014 demonstrating recurrent Stage IV disease after undergoing Xeloda+XRT finishing on 05/04/2013, followed by APR on 07/26/2013 by Dr. Arnoldo Morale with marked cytoreduction documented, followed by 6 months worth of Xeloda 1000 mg BID 2 weeks on and 1 week off x 6 months.  Surveillance per NCCN guidelines was followed with serial CT imaging of chest demonstrating an enlarging pulmonary nodule as mentioned previously.  Now on systemic chemotherapy consisting of FOLFOX + Avastin beginning on 01/04/2015.  Oncology history is up-to-date.  Pre-treatment labs today.  I am concerned about her renal function in addition to her hyperkalemia last week.  This was treated with Kayexalate.  History taking is difficult as the patient is a poor historian.  Additionally, she was dropped off today and therefore, she does not have a representative that can help with decision making regarding her cancer care.  Her guardian is the state.   Additionally, I am concerned about her difficulty getting out of her transportation vehicle today.  This is new, but she denies any signs or symptoms associated with Oxaliplatin-induced PN.  Aranesp today as planned at renal dosing.  Based upon her HGB trend, will increase Aranesp to 100 mcg every 2 weeks (~20% dose increase).  Hypocalcemia noted today.  No one is with her to confirm compliance with Oscal+D.  I will add a phosphorus level and magnesium level to her labs today.  Additionally, I have prescribed TUMS, 2 tablets TID for an equivalence of 1200 mg of elemental Ca++.  This dose was chosen in the event that she is compliant with her Oscal.  This Rx is printed for her to have.  Her Leukocytosis with neutrophilia is noted.  This is likely secondary to her Neulasta.  I will hold Neulasta for this cycle.  Her labs meet treatment parameters today.  Additionally, she  has no symptoms that concern me for toxicities of treatment.  Will pursue cycle #8 today as planned.  Return in 2 weeks for follow-up and next treatment.  ADDENDUM: I learned of a critical Magnesium level today of 0.9.  As a result, we will give 2 g of IV Magnesium.  When she returns to have her pump D/C'd, she will have a Calcium and Magnesium check and if Magnesium remains low, I will order another 2 g of Magnesium.  Information conveyed to Nurse Tech in chemo area and communicated to Amy, scheduler.  Orders are placed for labs and IV Mg today.  Will see what her Mg is in 3 days before deciding on need for further IV replacement.

## 2015-04-25 NOTE — Progress Notes (Addendum)
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  Aranesp every 14 days at renal dose.   INTERVAL HISTORY: [name] 65 y.o. female returns for followup of Locally advanced adenocarcinoma of the rectum, She tolerated combined modality therapy well. Radiation therapy completed on 05/04/2013 which was also the last day she received Xeloda, excellent tolerance, status post APR on 07/26/2013 with marked cytoreduction documented as evidenced by no lymph node involvement. pT3 tumor was found. S/P 6 months of Xeloda at 1000 mg BID 2 weeks on and 1 week off finishing in December 2015.  Serial CT imaging demonstrated an enlarging pulmonary nodule resulting in LLL superior segmentectomy by Dr. Roxan Hockey on 10/26/2014 demonstrating oligometastatic disease of rectal adenocarcinoma, resulting in an upstage to Stage IV disease.    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   03/25/2013 - 05/05/2013 Chemotherapy Xeloda 1500 mg BID 5 days/week with radiation therapy.   07/26/2013 Definitive Surgery Dr. Arnoldo Morale- Flexible sigmoidoscopy, abdominoperineal resection, total abdominal hysterectomy with bilateral salpingo-oophorectomy   08/30/2013 - 03/01/2014 Chemotherapy Xeloda 1000 mg BID 14 days on and 7 days off x 6 months   03/08/2014 Imaging CT CAP- Bilateral pulmonary nodules, including an 8 mm left lower lobe nodule. Metastatic disease is a concern. The left lower lobe nodule has progressed since 03/05/2013, when it  measured 5 mm.   05/26/2014 Imaging CT Chest- Continued enlargement of the superior segment left lower lobe nodule. Malignancy is likely.   In contrast, the right lower lobe nodule is probably benign.   08/17/2014 Imaging CT- Superior segment left lower lobe pulmonary nodule measures stable since the most recent comparison study, but is again noted to have increased in size when comparing to older studies. Continued close attention will be required as neoplasm remains a con   10/26/2014 Surgery thoracoscopic left lower lobe superior segmentectomy. Pathology with metastatic adenocarcinoma c/w colonic primary   01/04/2015 -  Chemotherapy FOLFOX+Avastin (Avastin started on 01/18/2015).   01/09/2015 Imaging CT CAP- Interval wedge resection of metastasis within the left lower lobe. No evidence of new metastatic disease within the chest, abdomen or pelvis.   02/15/2015 Treatment Plan Change Treatment deferred due to renal function change (patient poor historian) with N/V.   02/22/2015 Treatment Plan Change Added Aranesp at renal dosing to supportive therpay plan.   03/08/2015 Treatment Plan Change Defer treatment x 1 week   04/12/2015 Adverse Reaction Patient reported mouth sores.  None on exam.     04/12/2015 Treatment Plan Change 5FU bolus is discontinued for "mouth sores"   04/12/2015 Adverse Reaction Increasing Creatinine Cl.  CrCl calculated to be 30.7.     04/12/2015 Treatment Plan Change Oxaliplatin dose reduced by 20% to 65 mg/m2 based upon creatinine clearance.    I personally reviewed and went over laboratory results with the patient.  The results are noted within this dictation.  Labs will be updated today.   Hyperkalemia was noted previously and she was prescribed Kayexalate for this.  She reports that she had trouble getting  out of the car today.  She is unable to tell me in what what she had trouble.  She denies any numbness, tingling, pins/needles of toes, feet, legs, fingers hands.  She is seen  doing a puzzle and she denies any difficulties manipulating her pen and writing.  She denies any problems getting to the toilet and bathroom.  She denies any complications with getting on or off the toilet.  She denies any falls or stumbling.  She denies any difficulty with ambulation.  She is seen today with earrings in place.  She admits that she put them in on her own.  She denies any difficulties with getting dresses.  She denies any complaints otherwise including nausea, vomiting, diarrhea, and constipation.   Past Medical History  Diagnosis Date  . Hypertension   . Diabetes mellitus     years  . Asthma   . Depression   . Mental retardation     stopped school at 9th grade   . History of recurrent UTIs   . Shortness of breath     with exertion  . Cancer (HCC)     Rectal   . Arthritis   . Gout   . Seizures ([place])     more than 4 yrs since last seizure. UNknown etiology  . Rectal cancer ([place])   . Iron deficiency 04/27/2014  . Chronic renal disease, stage 3, moderately decreased glomerular filtration rate between 30-59 mL/min/1.73 square meter 04/27/2014  . Numbness and tingling in hands     x several months   . GERD (gastroesophageal reflux disease)   . Vitamin B 12 deficiency 02/01/2015    Incidentally found without antibody testing.      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     has No Known Allergies.  Current Outpatient Prescriptions on File Prior to Visit  Medication Sig Dispense Refill  . amLODipine (NORVASC) 10 MG tablet Take 1 tablet (10 mg total) by mouth every morning. 30 tablet 1  . aspirin EC 81 MG tablet Take 81 mg by mouth daily.    . calcium-vitamin D (OSCAL WITH D) 500-200 MG-UNIT tablet Take 2 tablets by mouth daily with breakfast. 60 tablet 5  . dextrose 5 % SOLN 1,000 mL with fluorouracil 5 GM/100ML SOLN Inject into the vein. Every 14 days over 46 hours    . diphenhydrAMINE (BENADRYL) 25 MG tablet Take 1 tablet (25 mg total) by mouth every 6 (six) hours. 14 tablet 0  . docusate sodium (COLACE) 100 MG capsule Take 100 mg by mouth 2 (two) times daily.    . Emollient (CERAVE) LOTN Apply 1 application topically daily. Apply to both feet    . ferrous sulfate 325 (65 FE) MG tablet TAKE (1) TABLET BY MOUTH TWICE DAILY. 60 tablet 4  . fish oil-omega-3 fatty acids 1000 MG capsule Take 1 g by mouth 2 (two) times daily.    . fluconazole (DIFLUCAN) 100 MG tablet Take 1  tablet (100 mg total) by mouth daily. 10 tablet 0  . Fluticasone-Salmeterol (ADVAIR) 250-50 MCG/DOSE AEPB Inhale 1 puff into the lungs every 12 (twelve) hours.    [name] glucose blood (ACCU-CHEK AVIVA PLUS) test strip 1 each by Other route daily. Use as instructed    . HYDROcodone-acetaminophen (NORCO/VICODIN) 5-325 MG tablet Take 1 tablet by mouth every 4 (four) hours as needed for moderate pain. For pain not relieved by tramadol 40 tablet 0  . ibuprofen (ADVIL,MOTRIN) 600 MG tablet Take 600 mg by mouth every 6 (six) hours as needed for moderate pain.    . Insulin Glargine (LANTUS SOLOSTAR) 100 UNIT/ML SOPN Inject 12 Units into the skin at bedtime.     [name] ipratropium-albuterol (DUONEB) 0.5-2.5 (3) MG/3ML SOLN     . leucovorin 50 MG injection Inject into the vein. Every 14 days    .  lidocaine (XYLOCAINE) 2 % solution TAKE 5 ML BY MOUTH FOUR TIMES DAILY. 360 mL 0  . lidocaine-prilocaine (EMLA) cream Apply a quarter size amount to port site 1 hour prior to chemo. Do not rub in. Cover with plastic wrap. 30 g 3  . lisinopril (PRINIVIL,ZESTRIL) 10 MG tablet Take 1 tablet (10 mg total) by mouth daily. 30 tablet 1  . loperamide (IMODIUM) 2 MG capsule Take 2 caps after 1st loose stool and then 1 cap q2h until 12 hours have passed without having a loose stool. At bedtime, take 2 caps. Then 2 caps q4 h until morning. 45 capsule 0  . loratadine-pseudoephedrine (LORATADINE-D 24HR) 10-240 MG per 24 hr tablet Take 1 tablet by mouth daily.    . magic mouthwash w/lidocaine SOLN Take 5 mLs by mouth 4 (four) times daily. 360 mL 1  . metFORMIN (GLUCOPHAGE) 1000 MG tablet Take 1,000 mg by mouth 2 (two) times daily with a meal.    . metoprolol succinate (TOPROL-XL) 50 MG 24 hr tablet Take 100 mg by mouth at bedtime.     . montelukast (SINGULAIR) 10 MG tablet Take 10 mg by mouth at bedtime.    . ondansetron (ZOFRAN) 8 MG tablet Take 1 tablet (8 mg total) by mouth every 8 (eight) hours as needed for nausea or vomiting. #1 nausea med to take 30 tablet 2  . OXALIPLATIN IV Inject into the vein every 14 (fourteen) days. Starting 10/19    . pantoprazole (PROTONIX) 40 MG tablet Take 40 mg by mouth every morning.     . Pegfilgrastim (NEULASTA ONPRO [place]) Inject into the skin. Every 14 days    . phenytoin (DILANTIN) 100 MG ER capsule Take 100 mg by mouth 2 (two) times daily.    . pravastatin (PRAVACHOL) 40 MG tablet Take 40 mg by mouth every evening.     [name] PROAIR HFA 108 (90 BASE) MCG/ACT inhaler     . prochlorperazine (COMPAZINE) 10 MG tablet Take 1 tablet (10 mg total) by mouth every 6 (six) hours as needed for nausea or vomiting. #2 nausea med to take 30 tablet 2  . silver sulfADIAZINE (SILVADENE) 1 % cream Apply 1 application topically every morning.     . sodium chloride (OCEAN) 0.65 % SOLN nasal spray  Place 2 sprays into both nostrils 4 (four) times daily as needed for congestion.    . sodium polystyrene (KAYEXALATE) powder Take by mouth once. 30 g 0  . traMADol (ULTRAM) 50 MG tablet Take 50 mg by mouth every 4 (four) hours as needed for moderate pain.     . traZODone (DESYREL)  50 MG tablet     . triamcinolone cream (KENALOG) 0.1 % Apply 1 application topically 2 (two) times daily. To stoma    . famotidine (PEPCID) 20 MG tablet Take 1 tablet (20 mg total) by mouth 2 (two) times daily. (Patient not taking: Reported on 04/26/2015) 14 tablet 0   Current Facility-Administered Medications on File Prior to Visit  Medication Dose Route Frequency Provider Last Rate Last Dose  . 0.9 %  sodium chloride infusion   Intravenous Once Patrici Ranks, MD      . bevacizumab (AVASTIN) 525 mg in sodium chloride 0.9 % 100 mL chemo infusion  5 mg/kg (Treatment Plan Actual) Intravenous Once Patrici Ranks, MD      . Darbepoetin Alfa (ARANESP) injection 100 mcg  100 mcg Subcutaneous Once Kadir Azucena S Nayda Riesen, PA-C      . dextrose 5 % solution   Intravenous Once Patrici Ranks, MD      . fluorouracil (ADRUCIL) 5,200 mg in sodium chloride 0.9 % 146 mL chemo infusion  2,400 mg/m2 (Treatment Plan Actual) Intravenous 1 day or 1 dose Patrici Ranks, MD      . leucovorin injection 44 mg  20 mg/m2 Intravenous Once Patrici Ranks, MD      . ondansetron (ZOFRAN) 8 mg, dexamethasone (DECADRON) 10 mg in sodium chloride 0.9 % 50 mL IVPB   Intravenous Once Patrici Ranks, MD      . oxaliplatin (ELOXATIN) 140 mg in dextrose 5 % 500 mL chemo infusion  65.45 mg/m2 (Treatment Plan Actual) Intravenous Once Patrici Ranks, MD      . sodium chloride 0.9 % injection 10 mL  10 mL Intracatheter PRN Patrici Ranks, MD        Past Surgical History  Procedure Laterality Date  . Abdominal hysterectomy    . Multiple extractions with alveoloplasty N/A 11/23/2012    Procedure: MULTIPLE EXTRACION 1, 2, 4, 5, 6, 7, 8, 9, 10, 11,  12, 13, 14, 17, 18, 20, 23, 24, 25, 26, 28, 29, 32 WITH ALVEOLOPLASTY, REMOVE BILATERAL TORI;  Surgeon: Gae Bon, DDS;  Location: [facility];  Service: Oral Surgery;  Laterality: N/A;  . Colonoscopy with esophagogastroduodenoscopy (egd) N/A 01/14/2013    Procedure: COLONOSCOPY WITH ESOPHAGOGASTRODUODENOSCOPY (EGD);  Surgeon: Rogene Houston, MD;  [facility];  Service: Endoscopy;  Laterality: N/A;  250-moved to 315 Ann to notify pt  . Colonoscopy N/A 02/04/2013    Procedure: COLONOSCOPY;  Surgeon: Rogene Houston, MD;  [facility];  Service: Endoscopy;  Laterality: N/A;  225  . Eus N/A 02/18/2013    Procedure: LOWER ENDOSCOPIC ULTRASOUND (EUS);  Surgeon: Milus Banister, MD;  Location: [facility];  Service: Endoscopy;  Laterality: N/A;  . Flexible sigmoidoscopy N/A 07/26/2013    Procedure: FLEXIBLE SIGMOIDOSCOPY;  Surgeon: Jamesetta So, MD;  [facility]: [facility];  Service: General;  Laterality: N/A;  . Abdominal perineal bowel resection N/A 07/26/2013    Procedure:  ABDOMINAL PERINEAL RESECTION;  Surgeon: Jamesetta So, MD;  [facility];  Service: General;  Laterality: N/A;  . Supracervical abdominal hysterectomy N/A 07/26/2013    Procedure: HYSTERECTOMY SUPRACERVICAL ABDOMINAL ;  Surgeon: Jamesetta So, MD;  [facility];  Service: General;  Laterality: N/A;  . Salpingoophorectomy Bilateral 07/26/2013    Procedure: SALPINGO OOPHORECTOMY;  Surgeon: Jamesetta So, MD;  [facility];  Service: General;  Laterality: Bilateral;  . Colostomy Left 07/26/2013  Procedure: COLOSTOMY;  Surgeon: Jamesetta So, MD;  [facility];  Service: General;  Laterality: Left;  . Colonoscopy N/A 05/12/2014    Procedure: COLONOSCOPY;  Surgeon: Rogene Houston, MD;  [facility];  Service: Endoscopy;  Laterality: N/A;  1030  . Colon surgery  2015    bowel resection/w colostomy  . Video assisted thoracoscopy Left 10/26/2014    Procedure: LEFT VIDEO ASSISTED  THORACOSCOPY;  Surgeon: [name], MD;  Location: [place];  Service: Thoracic;  Laterality: Left;  . Segmentecomy Left 10/26/2014    Procedure: LEFT LOWER LOBE SUPERIOR SEGMENTECTOMY;  Surgeon: [name], MD;  Location: [place];  Service: Thoracic;  Laterality: Left;  . Lymph node dissection Left 10/26/2014    Procedure: LYMPH NODE DISSECTION;  Surgeon: [name], MD;  Location: [place];  Service: Thoracic;  Laterality: Left;  . Portacath placement Left 01/02/2015    Procedure: INSERTION PORT-A-CATH;  Surgeon: Aviva Signs, MD;  [facility];  Service: General;  Laterality: Left;    Denies any headaches, dizziness, double vision, fevers, chills, night sweats, vomiting, diarrhea, constipation, chest pain, heart palpitations, shortness of breath, blood in stool, black tarry stool, urinary pain, urinary burning, urinary frequency, hematuria.   PHYSICAL EXAMINATION  ECOG PERFORMANCE STATUS: 2 - Symptomatic, <50% confined to bed  Filed Vitals:   04/26/15 1004  BP: 162/69  Pulse: 83  Temp: 97.5 F (36.4 C)  Resp: 18    GENERAL:alert, no distress, well nourished, comfortable, cooperative, obese, smiling and unaccompanied today and in a chemo-recliner. SKIN: skin color, texture, turgor are normal. HEAD: Normocephalic, No masses, lesions, tenderness or abnormalities EYES: normal, PERRLA, EOMI, Conjunctiva are pink and non-injected EARS: External ears normal OROPHARYNX:lips, buccal mucosa, and tongue normal and mucous membranes are moist  NECK: supple, no adenopathy, trachea midline LYMPH:  no palpable lymphadenopathy BREAST:not examined LUNGS: clear to auscultation  HEART: regular rate & rhythm ABDOMEN:abdomen soft, non-tender, obese and normal bowel sounds BACK: Back symmetric, no curvature., No CVA tenderness EXTREMITIES:less then 2 second capillary refill, no joint deformities, effusion, or inflammation, no skin discoloration, no cyanosis, darkening of  fingernails at the base, B/L LE edema with compression stockings in place, R>L without tenderness and negative homan's sign. NEURO: no focal motor/sensory deficits, gait normal   LABORATORY DATA: CBC    Component Value Date/Time   WBC 21.2* 04/26/2015 1005   RBC 3.02* 04/26/2015 1005   RBC 3.84* 11/07/2009 1649   HGB 8.9* 04/26/2015 1005   HCT 28.2* 04/26/2015 1005   PLT 157 04/26/2015 1005   MCV 93.4 04/26/2015 1005   MCH 29.5 04/26/2015 1005   MCHC 31.6 04/26/2015 1005   RDW 17.9* 04/26/2015 1005   LYMPHSABS 1.0 04/26/2015 1005   MONOABS 1.1* 04/26/2015 1005   EOSABS 0.1 04/26/2015 1005   BASOSABS 0.0 04/26/2015 1005      Chemistry      Component Value Date/Time   NA 138 04/26/2015 1005   K 4.7 04/26/2015 1005   CL 107 04/26/2015 1005   CO2 23 04/26/2015 1005   BUN 26* 04/26/2015 1005   CREATININE 1.36* 04/26/2015 1005   CREATININE 1.11* 02/08/2013 1451      Component Value Date/Time   CALCIUM 7.6* 04/26/2015 1005   ALKPHOS 187* 04/26/2015 1005   AST 24 04/26/2015 1005   ALT 30 04/26/2015 1005   BILITOT 0.2* 04/26/2015 1005      Lab Results  Component Value Date   CEA 4.5 01/04/2015  Lab Results  Component Value Date   FERRITIN 291 04/12/2015     PENDING LABS:   RADIOGRAPHIC STUDIES:  No results found.   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 Roxan Hockey on 10/26/2014 demonstrating recurrent Stage IV disease after undergoing Xeloda+XRT finishing on 05/04/2013, followed by APR on 07/26/2013 by Dr. Arnoldo Morale with marked cytoreduction documented, followed by 6 months worth of Xeloda 1000 mg BID 2 weeks on and 1 week off x 6 months.  Surveillance per NCCN guidelines was followed with serial CT imaging of chest demonstrating an enlarging pulmonary nodule as mentioned previously.  Now on systemic chemotherapy  consisting of FOLFOX + Avastin beginning on 01/04/2015.  Oncology history is up-to-date.  Pre-treatment labs today.  I am concerned about her renal function in addition to her hyperkalemia last week.  This was treated with Kayexalate.  History taking is difficult as the patient is a poor historian.  Additionally, she was dropped off today and therefore, she does not have a representative that can help with decision making regarding her cancer care.  Her guardian is the state.   Additionally, I am concerned about her difficulty getting out of her transportation vehicle today.  This is new, but she denies any signs or symptoms associated with Oxaliplatin-induced PN.  Aranesp today as planned at renal dosing.  Based upon her HGB trend, will increase Aranesp to 100 mcg every 2 weeks (~20% dose increase).  Hypocalcemia noted today.  No one is with her to confirm compliance with Oscal+D.  I will add a phosphorus level and magnesium level to her labs today.  Additionally, I have prescribed TUMS, 2 tablets TID for an equivalence of 1200 mg of elemental Ca++.  This dose was chosen in the event that she is compliant with her Oscal.  This Rx is printed for her to have.  Her Leukocytosis with neutrophilia is noted.  This is likely secondary to her Neulasta.  I will hold Neulasta for this cycle.  Her labs meet treatment parameters today.  Additionally, she has no symptoms that concern me for toxicities of treatment.  Will pursue cycle #8 today as planned.  Return in 2 weeks for follow-up and next treatment.  ADDENDUM: I learned of a critical Magnesium level today of 0.9.  As a result, we will give 2 g of IV Magnesium.  When she returns to have her pump D/C'd, she will have a Calcium and Magnesium check and if Magnesium remains low, I will order another 2 g of Magnesium.  Information conveyed to Nurse Tech in chemo area and communicated to Amy, scheduler.  Orders are placed for labs and IV Mg today.  Will see  what her Mg is in 3 days before deciding on need for further IV replacement.  Anemia Anemia of chronic renal disease.  On Aranesp 80 mcg since 03/08/2015.    Increase Aranesp to 100 mcg every 2 weeks (~20% increase) due to her decreasing HGB.  Supportive therapy plan reviewed and updated accordingly.   THERAPY PLAN:  Continue with adjuvant FOLFOX + Avastin.    All questions were answered. The patient knows to call the clinic with any problems, questions or concerns. We can certainly see the patient much sooner if necessary.  Patient and plan discussed with Dr. Ancil Linsey and she is in agreement with the aforementioned.   This note is electronically  signed by: Doy Mince 04/26/2015 1:21 PM

## 2015-04-26 ENCOUNTER — Encounter (HOSPITAL_BASED_OUTPATIENT_CLINIC_OR_DEPARTMENT_OTHER): Payer: Medicare Other | Admitting: Oncology

## 2015-04-26 ENCOUNTER — Encounter (HOSPITAL_COMMUNITY): Payer: Self-pay | Admitting: Oncology

## 2015-04-26 ENCOUNTER — Encounter (HOSPITAL_COMMUNITY): Payer: Medicare Other | Attending: Hematology and Oncology

## 2015-04-26 ENCOUNTER — Ambulatory Visit (HOSPITAL_COMMUNITY): Payer: Medicare Other | Admitting: Oncology

## 2015-04-26 ENCOUNTER — Telehealth (HOSPITAL_COMMUNITY): Payer: Self-pay | Admitting: *Deleted

## 2015-04-26 VITALS — BP 162/69 | HR 83 | Temp 97.5°F | Resp 18 | Wt 224.6 lb

## 2015-04-26 VITALS — BP 143/72 | HR 77 | Temp 98.0°F | Resp 18

## 2015-04-26 DIAGNOSIS — C20 Malignant neoplasm of rectum: Secondary | ICD-10-CM

## 2015-04-26 DIAGNOSIS — D649 Anemia, unspecified: Secondary | ICD-10-CM | POA: Diagnosis present

## 2015-04-26 DIAGNOSIS — D631 Anemia in chronic kidney disease: Secondary | ICD-10-CM | POA: Diagnosis not present

## 2015-04-26 DIAGNOSIS — C7800 Secondary malignant neoplasm of unspecified lung: Secondary | ICD-10-CM

## 2015-04-26 DIAGNOSIS — D72829 Elevated white blood cell count, unspecified: Secondary | ICD-10-CM

## 2015-04-26 DIAGNOSIS — Z5111 Encounter for antineoplastic chemotherapy: Secondary | ICD-10-CM

## 2015-04-26 DIAGNOSIS — R911 Solitary pulmonary nodule: Secondary | ICD-10-CM

## 2015-04-26 DIAGNOSIS — N189 Chronic kidney disease, unspecified: Secondary | ICD-10-CM | POA: Diagnosis not present

## 2015-04-26 DIAGNOSIS — Z5112 Encounter for antineoplastic immunotherapy: Secondary | ICD-10-CM

## 2015-04-26 DIAGNOSIS — E538 Deficiency of other specified B group vitamins: Secondary | ICD-10-CM

## 2015-04-26 LAB — CBC WITH DIFFERENTIAL/PLATELET
Basophils Absolute: 0 10*3/uL (ref 0.0–0.1)
Basophils Relative: 0 %
Eosinophils Absolute: 0.1 10*3/uL (ref 0.0–0.7)
Eosinophils Relative: 1 %
HEMATOCRIT: 28.2 % — AB (ref 36.0–46.0)
HEMOGLOBIN: 8.9 g/dL — AB (ref 12.0–15.0)
LYMPHS ABS: 1 10*3/uL (ref 0.7–4.0)
LYMPHS PCT: 5 %
MCH: 29.5 pg (ref 26.0–34.0)
MCHC: 31.6 g/dL (ref 30.0–36.0)
MCV: 93.4 fL (ref 78.0–100.0)
Monocytes Absolute: 1.1 10*3/uL — ABNORMAL HIGH (ref 0.1–1.0)
Monocytes Relative: 5 %
NEUTROS ABS: 19 10*3/uL — AB (ref 1.7–7.7)
NEUTROS PCT: 89 %
Platelets: 157 10*3/uL (ref 150–400)
RBC: 3.02 MIL/uL — AB (ref 3.87–5.11)
RDW: 17.9 % — ABNORMAL HIGH (ref 11.5–15.5)
WBC: 21.2 10*3/uL — AB (ref 4.0–10.5)

## 2015-04-26 LAB — COMPREHENSIVE METABOLIC PANEL
ALT: 30 U/L (ref 14–54)
AST: 24 U/L (ref 15–41)
Albumin: 3.5 g/dL (ref 3.5–5.0)
Alkaline Phosphatase: 187 U/L — ABNORMAL HIGH (ref 38–126)
Anion gap: 8 (ref 5–15)
BUN: 26 mg/dL — ABNORMAL HIGH (ref 6–20)
CO2: 23 mmol/L (ref 22–32)
Calcium: 7.6 mg/dL — ABNORMAL LOW (ref 8.9–10.3)
Chloride: 107 mmol/L (ref 101–111)
Creatinine, Ser: 1.36 mg/dL — ABNORMAL HIGH (ref 0.44–1.00)
GFR calc Af Amer: 47 mL/min — ABNORMAL LOW (ref >60)
GFR calc non Af Amer: 40 mL/min — ABNORMAL LOW (ref >60)
Glucose, Bld: 143 mg/dL — ABNORMAL HIGH (ref 65–99)
Potassium: 4.7 mmol/L (ref 3.5–5.1)
Sodium: 138 mmol/L (ref 135–145)
Total Bilirubin: 0.2 mg/dL — ABNORMAL LOW (ref 0.3–1.2)
Total Protein: 6.7 g/dL (ref 6.5–8.1)

## 2015-04-26 LAB — MAGNESIUM: Magnesium: 0.9 mg/dL — CL (ref 1.7–2.4)

## 2015-04-26 LAB — URINALYSIS, DIPSTICK ONLY
BILIRUBIN URINE: NEGATIVE
Glucose, UA: NEGATIVE mg/dL
KETONES UR: NEGATIVE mg/dL
Leukocytes, UA: NEGATIVE
NITRITE: NEGATIVE
PH: 5 (ref 5.0–8.0)
Specific Gravity, Urine: 1.02 (ref 1.005–1.030)

## 2015-04-26 LAB — PHOSPHORUS: Phosphorus: 3.1 mg/dL (ref 2.5–4.6)

## 2015-04-26 MED ORDER — MAGNESIUM SULFATE 2 GM/50ML IV SOLN
2.0000 g | Freq: Once | INTRAVENOUS | Status: AC
  Administered 2015-04-26: 2 g via INTRAVENOUS
  Filled 2015-04-26: qty 50

## 2015-04-26 MED ORDER — OXALIPLATIN CHEMO INJECTION 100 MG/20ML
65.4500 mg/m2 | Freq: Once | INTRAVENOUS | Status: AC
  Administered 2015-04-26: 140 mg via INTRAVENOUS
  Filled 2015-04-26: qty 28

## 2015-04-26 MED ORDER — CALCIUM CARBONATE ANTACID 500 MG PO CHEW: 2.0000 | CHEWABLE_TABLET | Freq: Three times a day (TID) | ORAL | Status: DC

## 2015-04-26 MED ORDER — LEUCOVORIN CALCIUM INJECTION 100 MG
20.0000 mg/m2 | Freq: Once | INTRAMUSCULAR | Status: AC
  Administered 2015-04-26: 44 mg via INTRAVENOUS
  Filled 2015-04-26: qty 2.2

## 2015-04-26 MED ORDER — SODIUM CHLORIDE 0.9 % IV SOLN
Freq: Once | INTRAVENOUS | Status: AC
  Administered 2015-04-26: 13:00:00 via INTRAVENOUS

## 2015-04-26 MED ORDER — SODIUM CHLORIDE 0.9 % IV SOLN
5.0000 mg/kg | Freq: Once | INTRAVENOUS | Status: AC
  Administered 2015-04-26: 525 mg via INTRAVENOUS
  Filled 2015-04-26: qty 16

## 2015-04-26 MED ORDER — DARBEPOETIN ALFA 100 MCG/0.5ML IJ SOSY
100.0000 ug | PREFILLED_SYRINGE | Freq: Once | INTRAMUSCULAR | Status: AC
  Administered 2015-04-26: 100 ug via SUBCUTANEOUS
  Filled 2015-04-26: qty 0.5

## 2015-04-26 MED ORDER — LEUCOVORIN CALCIUM INJECTION 350 MG: 400.0000 mg/m2 | Freq: Once | INTRAVENOUS | Status: DC

## 2015-04-26 MED ORDER — SODIUM CHLORIDE 0.9 % IV SOLN
Freq: Once | INTRAVENOUS | Status: AC
  Administered 2015-04-26: 13:00:00 via INTRAVENOUS
  Filled 2015-04-26: qty 4

## 2015-04-26 MED ORDER — DEXTROSE 5 % IV SOLN
Freq: Once | INTRAVENOUS | Status: AC
  Administered 2015-04-26: 14:00:00 via INTRAVENOUS

## 2015-04-26 MED ORDER — SODIUM CHLORIDE 0.9 % IV SOLN
2400.0000 mg/m2 | INTRAVENOUS | Status: DC
  Administered 2015-04-26: 5200 mg via INTRAVENOUS
  Filled 2015-04-26: qty 104

## 2015-04-26 MED ORDER — SODIUM CHLORIDE 0.9 % IJ SOLN
10.0000 mL | INTRAMUSCULAR | Status: DC | PRN
Start: 2015-04-26 — End: 2015-04-26
  Administered 2015-04-26: 10 mL
  Filled 2015-04-26: qty 10

## 2015-04-26 NOTE — Patient Instructions (Signed)
..  [place] at [facility] Discharge Instructions  RECOMMENDATIONS MADE BY THE CONSULTANT AND ANY TEST RESULTS WILL BE SENT TO YOUR REFERRING PHYSICIAN.  Increase Aranesp to 100 mcg every 2 weeks No neulasta today Treat today Return in 2 weeks and 4 weeks   Thank you for choosing [place] at [facility] to provide your oncology and hematology care.  To afford each patient quality time with our provider, please arrive at least 15 minutes before your scheduled appointment time.   Beginning January 23rd 2017 lab work for the [facility] will be done in the  Main lab at [facility] on 1st floor. If you have a lab appointment with the [facility] please come in thru the  Main Entrance and check in at the main information desk  You need to re-schedule your appointment should you arrive 10 or more minutes late.  We strive to give you quality time with our providers, and arriving late affects you and other patients whose appointments are after yours.  Also, if you no show three or more times for appointments you may be dismissed from the clinic at the providers discretion.     Again, thank you for choosing [facility].  Our hope is that these requests will decrease the amount of time that you wait before being seen by our physicians.       _____________________________________________________________  Should you have questions after your visit to [facility], please contact our office at [phone] between the hours of 8:30 a.m. and 4:30 p.m.  Voicemails left after 4:30 p.m. will not be returned until the following business day.  For prescription refill requests, have your pharmacy contact our office.

## 2015-04-26 NOTE — Telephone Encounter (Signed)
..  CRITICAL VALUE ALERT Critical value received:  Magnesium .09 Date of notification:  [id number] Time of notification: 04/26/2015 Critical value read back:  Yes.   Nurse who received alert:  TAR MD notified (1st page):  [id number]

## 2015-04-26 NOTE — Patient Instructions (Signed)
[facility] Discharge Instructions for Patients Receiving Chemotherapy  Today you received the following chemotherapy agents Oxaliplatin, leucovorin, avastin and 5FU pump. You also received a magnesium 2 gm infusion today.  You also received aranesp 100 mcg injection today. When you return Friday we will do lab work first and have you wait. You may need another magnesium infusion based on lab results on Friday. You will also get a vitamin B12 injection when you are here Friday. Appointment at 130 pm Friday.  To help prevent nausea and vomiting after your treatment, we encourage you to take your nausea medication as instructed. If you develop nausea and vomiting that is not controlled by your nausea medication, call the clinic. If it is after clinic hours your family physician or the after hours number for the clinic or go to the Emergency Department. BELOW ARE SYMPTOMS THAT SHOULD BE REPORTED IMMEDIATELY:  *FEVER GREATER THAN 101.0 F  *CHILLS WITH OR WITHOUT FEVER  NAUSEA AND VOMITING THAT IS NOT CONTROLLED WITH YOUR NAUSEA MEDICATION  *UNUSUAL SHORTNESS OF BREATH  *UNUSUAL BRUISING OR BLEEDING  TENDERNESS IN MOUTH AND THROAT WITH OR WITHOUT PRESENCE OF ULCERS  *URINARY PROBLEMS  *BOWEL PROBLEMS  UNUSUAL RASH Items with * indicate a potential emergency and should be followed up as soon as possible.  I have been informed and understand all the instructions given to me. I know to contact the clinic, my physician, or go to the Emergency Department if any problems should occur. I do not have any questions at this time, but understand that I may call the clinic during office hours or the Patient Navigator at [phone] should I have any questions or need assistance in obtaining follow up care.    __________________________________________  _____________  __________ Signature of Patient or Authorized Representative            Date                    Time    __________________________________________ Nurse's Signature

## 2015-04-26 NOTE — Addendum Note (Signed)
Addended by: Baird Cancer on: 04/26/2015 01:22 PM   Modules accepted: Orders

## 2015-04-26 NOTE — Assessment & Plan Note (Signed)
Anemia of chronic renal disease.  On Aranesp 80 mcg since 03/08/2015.    Increase Aranesp to 100 mcg every 2 weeks (~20% increase) due to her decreasing HGB.  Supportive therapy plan reviewed and updated accordingly.

## 2015-04-26 NOTE — Progress Notes (Signed)
[name] presents today for injection per MD orders. Aranesp 100 mcg administered SQ in right Abdomen. Administration without incident. Patient tolerated well.   Tolerated chemo well. Continuous infusion pump intact. Stable on discharge home with caregiver via wheelchair.

## 2015-04-28 ENCOUNTER — Encounter (HOSPITAL_COMMUNITY): Payer: Medicare Other

## 2015-04-28 ENCOUNTER — Encounter (HOSPITAL_BASED_OUTPATIENT_CLINIC_OR_DEPARTMENT_OTHER): Payer: Medicare Other

## 2015-04-28 DIAGNOSIS — C20 Malignant neoplasm of rectum: Secondary | ICD-10-CM | POA: Diagnosis not present

## 2015-04-28 DIAGNOSIS — E538 Deficiency of other specified B group vitamins: Secondary | ICD-10-CM | POA: Diagnosis not present

## 2015-04-28 DIAGNOSIS — C7800 Secondary malignant neoplasm of unspecified lung: Secondary | ICD-10-CM

## 2015-04-28 DIAGNOSIS — R911 Solitary pulmonary nodule: Secondary | ICD-10-CM

## 2015-04-28 LAB — CALCIUM: Calcium: 8.4 mg/dL — ABNORMAL LOW (ref 8.9–10.3)

## 2015-04-28 LAB — MAGNESIUM: Magnesium: 1.3 mg/dL — ABNORMAL LOW (ref 1.7–2.4)

## 2015-04-28 MED ORDER — CYANOCOBALAMIN 1000 MCG/ML IJ SOLN
1000.0000 ug | Freq: Once | INTRAMUSCULAR | Status: AC
  Administered 2015-04-28: 1000 ug via INTRAMUSCULAR

## 2015-04-28 MED ORDER — CYANOCOBALAMIN 1000 MCG/ML IJ SOLN
INTRAMUSCULAR | Status: AC
  Filled 2015-04-28: qty 1

## 2015-04-28 MED ORDER — HEPARIN SOD (PORK) LOCK FLUSH 100 UNIT/ML IV SOLN
INTRAVENOUS | Status: AC
  Filled 2015-04-28: qty 5

## 2015-04-28 MED ORDER — MAGNESIUM SULFATE 2 GM/50ML IV SOLN
2.0000 g | Freq: Once | INTRAVENOUS | Status: AC
  Administered 2015-04-28: 2 g via INTRAVENOUS
  Filled 2015-04-28: qty 50

## 2015-04-28 MED ORDER — HEPARIN SOD (PORK) LOCK FLUSH 100 UNIT/ML IV SOLN
500.0000 [IU] | Freq: Once | INTRAVENOUS | Status: AC | PRN
  Administered 2015-04-28: 500 [IU]

## 2015-04-28 MED ORDER — SODIUM CHLORIDE 0.9 % IJ SOLN
10.0000 mL | INTRAMUSCULAR | Status: DC | PRN
  Administered 2015-04-28 (×2): 10 mL
  Filled 2015-04-28 (×2): qty 10

## 2015-04-28 NOTE — Progress Notes (Signed)
Antonina Deziel presents today for injection per MD orders. B12 1000 mcg administered IM in left Upper Arm. Administration without incident. Patient tolerated well.   Tolerated magnesium infusion well. Flushed port per protocol. Removed port needle. Patient stable on discharge home with caregiver via wheelchair.

## 2015-04-28 NOTE — Patient Instructions (Signed)
[place] [facility] Discharge Instructions  RECOMMENDATIONS MADE BY THE CONSULTANT AND ANY TEST RESULTS WILL BE SENT TO YOUR REFERRING PHYSICIAN.  Magnesium 2 gm IV infusion given today. Vitamin B12 1000 mcg injection given today. Return as scheduled.  Thank you for choosing [place] at [facility] to provide your oncology and hematology care.  To afford each patient quality time with our provider, please arrive at least 15 minutes before your scheduled appointment time.   Beginning January 23rd 2017 lab work for the [facility] will be done in the  Main lab at [facility] on 1st floor. If you have a lab appointment with the [facility] please come in thru the  Main Entrance and check in at the main information desk  You need to re-schedule your appointment should you arrive 10 or more minutes late.  We strive to give you quality time with our providers, and arriving late affects you and other patients whose appointments are after yours.  Also, if you no show three or more times for appointments you may be dismissed from the clinic at the providers discretion.     Again, thank you for choosing [facility].  Our hope is that these requests will decrease the amount of time that you wait before being seen by our physicians.       _____________________________________________________________  Should you have questions after your visit to [facility], please contact our office at [phone] between the hours of 8:30 a.m. and 4:30 p.m.  Voicemails left after 4:30 p.m. will not be returned until the following business day.  For prescription refill requests, have your pharmacy contact our office.

## 2015-05-02 ENCOUNTER — Other Ambulatory Visit (HOSPITAL_COMMUNITY): Payer: Self-pay | Admitting: *Deleted

## 2015-05-02 DIAGNOSIS — C7800 Secondary malignant neoplasm of unspecified lung: Principal | ICD-10-CM

## 2015-05-02 DIAGNOSIS — C20 Malignant neoplasm of rectum: Secondary | ICD-10-CM

## 2015-05-02 MED ORDER — CLOTRIMAZOLE-BETAMETHASONE 1-0.05 % EX CREA: TOPICAL_CREAM | CUTANEOUS | Status: DC

## 2015-05-08 ENCOUNTER — Telehealth (HOSPITAL_COMMUNITY): Payer: Self-pay | Admitting: *Deleted

## 2015-05-08 ENCOUNTER — Other Ambulatory Visit (HOSPITAL_COMMUNITY): Payer: Self-pay | Admitting: Emergency Medicine

## 2015-05-08 MED ORDER — TRAMADOL HCL 50 MG PO TABS: 50.0000 mg | ORAL_TABLET | ORAL | Status: DC | PRN

## 2015-05-08 NOTE — Telephone Encounter (Signed)
Phoned in to pharmacy with 2 refills

## 2015-05-08 NOTE — Telephone Encounter (Signed)
Please refill Tramodol.  Call this into her pharmacy.  Robynn Pane, PA-C 05/08/2015 5:16 PM

## 2015-05-10 ENCOUNTER — Encounter (HOSPITAL_COMMUNITY): Payer: Medicare Other

## 2015-05-10 ENCOUNTER — Encounter (HOSPITAL_COMMUNITY): Payer: Self-pay | Admitting: Hematology & Oncology

## 2015-05-10 ENCOUNTER — Encounter (HOSPITAL_BASED_OUTPATIENT_CLINIC_OR_DEPARTMENT_OTHER): Payer: Medicare Other

## 2015-05-10 ENCOUNTER — Encounter (HOSPITAL_BASED_OUTPATIENT_CLINIC_OR_DEPARTMENT_OTHER): Payer: Medicare Other | Admitting: Hematology & Oncology

## 2015-05-10 VITALS — BP 170/61 | HR 79 | Temp 98.0°F | Resp 18 | Wt 223.0 lb

## 2015-05-10 VITALS — BP 182/79 | HR 94 | Temp 97.7°F | Resp 18

## 2015-05-10 DIAGNOSIS — Z5111 Encounter for antineoplastic chemotherapy: Secondary | ICD-10-CM | POA: Diagnosis not present

## 2015-05-10 DIAGNOSIS — G40909 Epilepsy, unspecified, not intractable, without status epilepticus: Secondary | ICD-10-CM

## 2015-05-10 DIAGNOSIS — C19 Malignant neoplasm of rectosigmoid junction: Secondary | ICD-10-CM

## 2015-05-10 DIAGNOSIS — C20 Malignant neoplasm of rectum: Secondary | ICD-10-CM | POA: Diagnosis not present

## 2015-05-10 DIAGNOSIS — Z5112 Encounter for antineoplastic immunotherapy: Secondary | ICD-10-CM

## 2015-05-10 DIAGNOSIS — R911 Solitary pulmonary nodule: Secondary | ICD-10-CM

## 2015-05-10 DIAGNOSIS — D649 Anemia, unspecified: Secondary | ICD-10-CM

## 2015-05-10 DIAGNOSIS — N189 Chronic kidney disease, unspecified: Secondary | ICD-10-CM

## 2015-05-10 DIAGNOSIS — E538 Deficiency of other specified B group vitamins: Secondary | ICD-10-CM

## 2015-05-10 DIAGNOSIS — C7800 Secondary malignant neoplasm of unspecified lung: Secondary | ICD-10-CM

## 2015-05-10 DIAGNOSIS — F79 Unspecified intellectual disabilities: Secondary | ICD-10-CM

## 2015-05-10 DIAGNOSIS — D631 Anemia in chronic kidney disease: Secondary | ICD-10-CM

## 2015-05-10 LAB — CBC WITH DIFFERENTIAL/PLATELET
BASOS ABS: 0 10*3/uL (ref 0.0–0.1)
BASOS PCT: 1 %
EOS PCT: 4 %
Eosinophils Absolute: 0.2 10*3/uL (ref 0.0–0.7)
HCT: 28.6 % — ABNORMAL LOW (ref 36.0–46.0)
Hemoglobin: 8.9 g/dL — ABNORMAL LOW (ref 12.0–15.0)
LYMPHS PCT: 10 %
Lymphs Abs: 0.4 10*3/uL — ABNORMAL LOW (ref 0.7–4.0)
MCH: 29.5 pg (ref 26.0–34.0)
MCHC: 31.1 g/dL (ref 30.0–36.0)
MCV: 94.7 fL (ref 78.0–100.0)
MONO ABS: 0.8 10*3/uL (ref 0.1–1.0)
Monocytes Relative: 22 %
Neutro Abs: 2.4 10*3/uL (ref 1.7–7.7)
Neutrophils Relative %: 63 %
PLATELETS: 165 10*3/uL (ref 150–400)
RBC: 3.02 MIL/uL — AB (ref 3.87–5.11)
RDW: 17.6 % — AB (ref 11.5–15.5)
WBC: 3.7 10*3/uL — AB (ref 4.0–10.5)

## 2015-05-10 LAB — URINALYSIS, DIPSTICK ONLY
Bilirubin Urine: NEGATIVE
GLUCOSE, UA: NEGATIVE mg/dL
Ketones, ur: NEGATIVE mg/dL
Nitrite: NEGATIVE
PH: 5 (ref 5.0–8.0)
Protein, ur: NEGATIVE mg/dL

## 2015-05-10 LAB — COMPREHENSIVE METABOLIC PANEL
ALBUMIN: 3.6 g/dL (ref 3.5–5.0)
ALT: 18 U/L (ref 14–54)
AST: 23 U/L (ref 15–41)
Alkaline Phosphatase: 122 U/L (ref 38–126)
Anion gap: 9 (ref 5–15)
BUN: 26 mg/dL — AB (ref 6–20)
CHLORIDE: 103 mmol/L (ref 101–111)
CO2: 25 mmol/L (ref 22–32)
CREATININE: 1.2 mg/dL — AB (ref 0.44–1.00)
Calcium: 8.3 mg/dL — ABNORMAL LOW (ref 8.9–10.3)
GFR calc Af Amer: 55 mL/min — ABNORMAL LOW (ref >60)
GFR calc non Af Amer: 47 mL/min — ABNORMAL LOW (ref >60)
GLUCOSE: 161 mg/dL — AB (ref 65–99)
POTASSIUM: 5 mmol/L (ref 3.5–5.1)
SODIUM: 137 mmol/L (ref 135–145)
Total Bilirubin: 0.3 mg/dL (ref 0.3–1.2)
Total Protein: 6.7 g/dL (ref 6.5–8.1)

## 2015-05-10 LAB — FERRITIN: Ferritin: 247 ng/mL (ref 11–307)

## 2015-05-10 MED ORDER — DEXTROSE 5 % IV SOLN
Freq: Once | INTRAVENOUS | Status: AC
  Administered 2015-05-10: 13:00:00 via INTRAVENOUS

## 2015-05-10 MED ORDER — SODIUM CHLORIDE 0.9 % IV SOLN
Freq: Once | INTRAVENOUS | Status: AC
  Administered 2015-05-10: 12:00:00 via INTRAVENOUS
  Filled 2015-05-10: qty 4

## 2015-05-10 MED ORDER — SODIUM CHLORIDE 0.9 % IV SOLN
5.0000 mg/kg | Freq: Once | INTRAVENOUS | Status: AC
  Administered 2015-05-10: 525 mg via INTRAVENOUS
  Filled 2015-05-10: qty 16

## 2015-05-10 MED ORDER — SODIUM CHLORIDE 0.9 % IV SOLN
Freq: Once | INTRAVENOUS | Status: AC
  Administered 2015-05-10: 12:00:00 via INTRAVENOUS

## 2015-05-10 MED ORDER — DARBEPOETIN ALFA 100 MCG/0.5ML IJ SOSY
PREFILLED_SYRINGE | INTRAMUSCULAR | Status: AC
  Filled 2015-05-10: qty 0.5

## 2015-05-10 MED ORDER — SODIUM CHLORIDE 0.9 % IV SOLN
2400.0000 mg/m2 | INTRAVENOUS | Status: DC
  Administered 2015-05-10: 5200 mg via INTRAVENOUS
  Filled 2015-05-10: qty 104

## 2015-05-10 MED ORDER — DARBEPOETIN ALFA 100 MCG/0.5ML IJ SOSY
100.0000 ug | PREFILLED_SYRINGE | Freq: Once | INTRAMUSCULAR | Status: AC
  Administered 2015-05-10: 100 ug via SUBCUTANEOUS

## 2015-05-10 MED ORDER — OXALIPLATIN CHEMO INJECTION 100 MG/20ML
65.4500 mg/m2 | Freq: Once | INTRAVENOUS | Status: AC
  Administered 2015-05-10: 140 mg via INTRAVENOUS
  Filled 2015-05-10: qty 28

## 2015-05-10 MED ORDER — LEUCOVORIN CALCIUM INJECTION 350 MG
400.0000 mg/m2 | Freq: Once | INTRAVENOUS | Status: AC
  Administered 2015-05-10: 868 mg via INTRAVENOUS
  Filled 2015-05-10: qty 43.4

## 2015-05-10 MED ORDER — SODIUM CHLORIDE 0.9 % IJ SOLN
10.0000 mL | INTRAMUSCULAR | Status: DC | PRN
  Administered 2015-05-10: 10 mL
  Filled 2015-05-10: qty 10

## 2015-05-10 NOTE — Progress Notes (Signed)
Jennifer Bogus, MD [place] [id number]  History of Locally advanced Rectal Cancer Recurrent disease with solitary pulmonary metastases, s/p resection  CURRENT THERAPY: FOLFOX/AVASTIN adjuvant  INTERVAL HISTORY: [name] 65 y.o. female returns for followup of Locally advanced adenocarcinoma of the rectum, She tolerated combined modality therapy well. Radiation therapy completed on 05/04/2013 which was also the last day she received Xeloda, excellent tolerance, status post APR on 07/26/2013 with marked cytoreduction documented as evidenced by no lymph node involvement. pT3 tumor was found. S/P 6 months of Xeloda at 1000 mg BID 2 weeks on and 1 week off finishing in December 2015.   She has undergone successful resection of a solitary pulmonary nodule, unfortunately pathology was consistent with recurrent colorectal cancer.  She lives at [facility] off of [address].   [name] is here alone. She is here for cycle #9 folfox and cycle #8 bevacizumab today.   Denies tingling in her hands or feet. Reports her leg becomes stiff sometimes. She has been eating fine and eats what she wants. Denies vomiting. Denies chest pain, breathing problems, or problems with ostomy. When asked if she sleeps at night, states that she sleeps during the day. She continues to work word puzzles. She is without complaints.   She has a significant anemia. She is on renal dosing of aranesp that was just increased 2 weeks ago.     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   03/25/2013 - 05/05/2013 Chemotherapy Xeloda 1500 mg BID 5 days/week with radiation therapy.   07/26/2013 Definitive Surgery Dr. Arnoldo Morale- Flexible sigmoidoscopy, abdominoperineal resection, total abdominal hysterectomy with bilateral  salpingo-oophorectomy   08/30/2013 - 03/01/2014 Chemotherapy Xeloda 1000 mg BID 14 days on and 7 days off x 6 months   03/08/2014 Imaging CT CAP- Bilateral pulmonary nodules, including an 8 mm left lower lobe nodule. Metastatic disease is a concern. The left lower lobe nodule has progressed since 03/05/2013, when it measured 5 mm.   05/26/2014 Imaging CT Chest- Continued enlargement of the superior segment left lower lobe nodule. Malignancy is likely.   In contrast, the right lower lobe nodule is probably benign.   08/17/2014 Imaging CT- Superior segment left lower lobe pulmonary nodule measures stable since the most recent comparison study, but is again noted to have increased in size when comparing to older studies. Continued close attention will be required as neoplasm remains a con   10/26/2014 Surgery thoracoscopic left lower lobe superior segmentectomy. Pathology with metastatic adenocarcinoma c/w colonic primary   01/04/2015 -  Chemotherapy FOLFOX+Avastin (Avastin started on 01/18/2015).   01/09/2015 Imaging CT CAP- Interval wedge resection of metastasis within the left lower lobe. No evidence of new metastatic disease within the chest, abdomen or pelvis.   02/15/2015 Treatment Plan Change Treatment deferred due to renal function change (patient poor historian) with N/V.   02/22/2015 Treatment Plan Change Added Aranesp at renal dosing to supportive therpay plan.   03/08/2015 Treatment Plan Change Defer treatment x 1 week   04/12/2015 Adverse Reaction Patient reported mouth sores.  None on exam.     04/12/2015 Treatment Plan Change 5FU bolus is discontinued for "mouth sores"   04/12/2015 Adverse Reaction Increasing Creatinine Cl.  CrCl calculated to be 30.7.     04/12/2015 Treatment Plan Change Oxaliplatin dose  reduced by 20% to 65 mg/m2 based upon creatinine clearance.       Past Medical History  Diagnosis Date  . Hypertension   . Diabetes mellitus     years  . Asthma   . Depression   . Mental  retardation     stopped school at 9th grade   . History of recurrent UTIs   . Shortness of breath     with exertion  . Cancer (HCC)     Rectal   . Arthritis   . Gout   . Seizures ([place])     more than 4 yrs since last seizure. UNknown etiology  . Rectal cancer ([place])   . Iron deficiency 04/27/2014  . Chronic renal disease, stage 3, moderately decreased glomerular filtration rate between 30-59 mL/min/1.73 square meter 04/27/2014  . Numbness and tingling in hands     x several months   . GERD (gastroesophageal reflux disease)   . Vitamin B 12 deficiency 02/01/2015    Incidentally found without antibody testing.      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     has No Known Allergies.  Ms. TPhillipsondoes not currently have medications on file.  Past Surgical History  Procedure Laterality Date  . Abdominal hysterectomy    . Multiple extractions with alveoloplasty N/A 11/23/2012    Procedure: MULTIPLE EXTRACION 1, 2, 4, 5, 6, 7, 8, 9, 10, 11, 12, 13, 14, 17, 18, 20, 23, 24, 25, 26, 28, 29, 32 WITH ALVEOLOPLASTY, REMOVE BILATERAL TORI;  Surgeon: SGae Bon DDS;  [facility]  Service: Oral Surgery;  Laterality: N/A;  . Colonoscopy with esophagogastroduodenoscopy (egd)  N/A 01/14/2013    Procedure: COLONOSCOPY WITH ESOPHAGOGASTRODUODENOSCOPY (EGD);  Surgeon: NRogene Houston MD;  [facility];  Service: Endoscopy;  Laterality: N/A;  250-moved to 315 Ann to notify pt  . Colonoscopy N/A 02/04/2013    Procedure: COLONOSCOPY;  Surgeon: NRogene Houston MD;  [facility];  Service: Endoscopy;  Laterality: N/A;  225  . Eus N/A 02/18/2013    Procedure: LOWER ENDOSCOPIC ULTRASOUND (EUS);  Surgeon: DMilus Banister MD;  [facility];  Service: Endoscopy;  Laterality: N/A;  . Flexible sigmoidoscopy N/A 07/26/2013    Procedure: FLEXIBLE SIGMOIDOSCOPY;  Surgeon: MJamesetta So MD;  Location: [facility];  Service: General;  Laterality: N/A;  . Abdominal perineal bowel resection N/A 07/26/2013    Procedure:  ABDOMINAL PERINEAL RESECTION;  Surgeon: MJamesetta So MD;  Location: [facility];  Service: General;  Laterality: N/A;  . Supracervical abdominal hysterectomy N/A 07/26/2013    Procedure: HYSTERECTOMY SUPRACERVICAL ABDOMINAL ;  Surgeon: MJamesetta So MD;  Location: [facility];  Service: General;  Laterality: N/A;  . Salpingoophorectomy Bilateral 07/26/2013    Procedure: SALPINGO OOPHORECTOMY;  Surgeon: MJamesetta So MD;  Location: [facility];  Service: General;  Laterality: Bilateral;  . Colostomy Left 07/26/2013    Procedure: COLOSTOMY;  Surgeon: Jamesetta So, MD;  Location: [facility];  Service: General;  Laterality: Left;  . Colonoscopy N/A 05/12/2014    Procedure: COLONOSCOPY;  Surgeon: Rogene Houston, MD;  [facility];  Service: Endoscopy;  Laterality: N/A;  1030  . Colon surgery  2015    bowel resection/w colostomy  . Video assisted thoracoscopy Left 10/26/2014    Procedure: LEFT VIDEO ASSISTED THORACOSCOPY;  Surgeon: [name], MD;  Location: [place];  Service: Thoracic;  Laterality: Left;  . Segmentecomy Left 10/26/2014    Procedure: LEFT LOWER LOBE SUPERIOR SEGMENTECTOMY;  Surgeon: [name], MD;  Location: [place];   Service: Thoracic;  Laterality: Left;  . Lymph node dissection Left 10/26/2014    Procedure: LYMPH NODE DISSECTION;  Surgeon: [name], MD;  Location: [place];  Service: Thoracic;  Laterality: Left;  . Portacath placement Left 01/02/2015    Procedure: INSERTION PORT-A-CATH;  Surgeon: Aviva Signs, MD;  Location: [facility];  Service: General;  Laterality: Left;    Denies any headaches, dizziness, double vision, fevers, chills, night sweats, nausea, vomiting, diarrhea, constipation, chest pain, heart palpitations, shortness of breath, blood in stool, black tarry stool, urinary pain, urinary burning, urinary frequency, hematuria. 14 point review of systems was performed and is negative except as detailed under history of present illness and above  PHYSICAL EXAMINATION  ECOG PERFORMANCE STATUS: 1  Filed Vitals:   05/10/15 0920  BP: 170/61  Pulse: 79  Temp: 98 F (36.7 C)  Resp: 18    Filed Weights   05/10/15 0920  Weight: 223 lb (101.152 kg)    GENERAL:alert, no distress, well nourished, well developed, comfortable, cooperative, obese, smiling. In treatment chair. SKIN: skin color, texture, turgor are normal, no rashes or significant lesions HEAD: Normocephalic, No masses, lesions, tenderness or abnormalities EYES: normal, PERRLA, EOMI, Conjunctiva are pink and non-injected EARS: External ears normal OROPHARYNX:lips, buccal mucosa, and tongue normal and mucous membranes are moist  NECK: supple, no adenopathy, thyroid normal size, non-tender, without nodularity, no stridor, non-tender, trachea midline LYMPH:  no palpable lymphadenopathy BREAST:not examined LUNGS: clear to auscultation  HEART: regular rate & rhythm, no murmurs and no gallops ABDOMEN:abdomen soft, obese and normal bowel sounds BACK: Back symmetric, no curvature., No CVA tenderness EXTREMITIES:less then 2 second capillary refill, no joint deformities, effusion, or inflammation, no skin discoloration, no  clubbing, no cyanosis BLE TED hose NEURO: alert & oriented x 3, no focal motor/sensory deficits, gait normal but slow   LABORATORY DATA: I have reviewed the results listed below CBC    Component Value Date/Time   WBC 3.7* 05/10/2015 0920   RBC 3.02* 05/10/2015 0920   RBC 3.84* 11/07/2009 1649   HGB 8.9* 05/10/2015 0920   HCT 28.6* 05/10/2015 0920   PLT 165 05/10/2015 0920   MCV 94.7 05/10/2015 0920   MCH 29.5 05/10/2015 0920   MCHC 31.1 05/10/2015 0920   RDW 17.6* 05/10/2015 0920   LYMPHSABS 0.4* 05/10/2015 0920   MONOABS 0.8 05/10/2015 0920   EOSABS 0.2 05/10/2015 0920   BASOSABS 0.0 05/10/2015 0920   CMP     Component Value Date/Time   NA 137 05/10/2015 0920   K 5.0 05/10/2015 0920   CL 103 05/10/2015 0920   CO2 25 05/10/2015 0920   GLUCOSE 161* 05/10/2015 0920   BUN 26* 05/10/2015 0920   CREATININE 1.20* 05/10/2015 0920   CREATININE 1.11* 02/08/2013 1451   CALCIUM 8.3* 05/10/2015 0920   PROT 6.7 05/10/2015 0920  ALBUMIN 3.6 05/10/2015 0920   AST 23 05/10/2015 0920   ALT 18 05/10/2015 0920   ALKPHOS 122 05/10/2015 0920   BILITOT 0.3 05/10/2015 0920   GFRNONAA 47* 05/10/2015 0920   GFRNONAA 54* 02/08/2013 1451   GFRAA 55* 05/10/2015 0920   GFRAA 62 02/08/2013 1451    PATHOLOGYF SURGICAL PATHOLOGY FIAL DIAGNOSIS Diagnosis 1. Lung, wedge biopsy/resection, superior segment of left lower lobe - METASTATIC ADENOCARCINOMA, CONSISTENT WITH COLONIC PRIMARY, SPANNING 1.5 CM. - LYMPHOVASCULAR INVASION IS IDENTIFIED. - THE SURGICAL RESECTION MARGINS ARE NEGATIVE FOR CARCINOMA. - SEE COMMENT. 2. Lymph node, biopsy, 9 L - THERE IS NO EVIDENCE OF CARCINOMA IN 1 OF 1 LYMPH NODE (0/1). 3. Lymph node, biopsy, 9 L #2 - THERE IS NO EVIDENCE OF CARCINOMA IN 1 OF 1 LYMPH NODE (0/1). 4. Lymph node, biopsy, 11 L - THERE IS NO EVIDENCE OF CARCINOMA IN 1 OF 1 LYMPH NODE (0/1). 5. Lymph node, biopsy, 5 - THERE IS NO EVIDENCE OF CARCINOMA IN 1 OF 1 LYMPH NODE (0/1). Microscopic  Comment 1. A block will be sent for KRAS testing and the results reported separately. Additional testing can be performed upon clinician request. (JBK:ds 10/27/14) [name], Electronic Signature (Case signed 10/27/2014) Intraoperative  RADIOGRAPHIC STUDIES: I have personally reviewed the radiological images as listed and agreed with the findings in the report. Study Result     CLINICAL DATA: Stage IV colorectal cancer. Chemotherapy ongoing. Subsequent encounter.  EXAM: CT CHEST, ABDOMEN, AND PELVIS WITH CONTRAST  TECHNIQUE: Multidetector CT imaging of the chest, abdomen and pelvis was performed following the standard protocol during bolus administration of intravenous contrast.  CONTRAST: 152m OMNIPAQUE IOHEXOL 300 MG/ML SOLN  COMPARISON: CTs 03/07/2014. PET-CT 10/07/2014.  FINDINGS: CT CHEST FINDINGS  Mediastinum/Nodes: There are no enlarged mediastinal, hilar or axillary lymph nodes. Small axillary lymph nodes bilaterally are stable. The heart size is normal. There is no pericardial effusion. Left subclavian Port-A-Cath tip in the left brachiocephalic vein. No significant vascular findings.  Lungs/Pleura: There is no pleural effusion.Interval wedge resection of metastasis in the superior segment of the left lower lobe. For no new nodules demonstrated. There are low lung volumes and mild perihilar atelectasis bilaterally.  Musculoskeletal/Chest wall: No chest wall mass or lytic lesion identified. Heterogeneous sclerosis throughout the spine is again noted associated with changes of diffuse idiopathic skeletal hyperostosis. These marrow changes are chronic, and given the associated findings in the bony pelvis may be related to Paget's disease.  CT ABDOMEN AND PELVIS FINDINGS  Hepatobiliary: The hepatic density is diffusely decreased. No focal hepatic lesions are identified. No evidence of gallstones, gallbladder wall thickening or  biliary dilatation.  Pancreas: Atrophy without focal abnormality or surrounding inflammation.  Spleen: Normal in size without focal abnormality.  Adrenals/Urinary Tract: The adrenal glands appear stable with low-density prominence of the right gland, consistent with an adenoma. The right kidney appears stable without significant findings. Advanced cortical scarring and atrophy on the left are again noted. No evidence of renal mass or hydronephrosis. Possible postsurgical changes in the left renal pelvis, stable. The bladder appears unremarkable.  Stomach/Bowel: No evidence of bowel wall thickening, distention or surrounding inflammatory change. Descending colostomy noted with increased parastomal herniation of omental fat and colon. No evidence of incarceration. The appendix appears normal.  Vascular/Lymphatic: There are no enlarged abdominal or pelvic lymph nodes. No significant vascular findings are present.  Reproductive: Hysterectomy. Prominent soft tissue anterior to the sacrum is unchanged, probably due to a combination of  cervix and presacral fibrosis from previous APR. This was not hypermetabolic on PET-CT.  Other: None.  Musculoskeletal: No acute or significant osseous findings. 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  IMPRESSION: 1. Interval wedge resection of metastasis within the left lower lobe. 2. No evidence of new metastatic disease within the chest, abdomen or pelvis. 3. Slightly increased parastomal herniation of omental fat and colon status post APR. Presacral soft tissue appears unchanged. 4. Port-A-Cath tip in the left brachiocephalic vein.   Electronically Signed  By: Richardean Sale M.D.  On: 01/09/2015 16:29    ASSESSMENT AND PLAN:  Stage IV CRC with solitary pulmonary nodule, s/p resection History of locally advanced rectal cancer Prior tobacco  abuse Pagets disease of bone Anemia CKD CEA was elevated at time of initial diagnosis in 2014, but not with recent recurrence Limited decision making capacity  She has undergone successful surgical resection of the solitary pulmonary nodule. Unfortunately pathology was c/w recurrent CRC. The patient and her caregivers opted to proceed with therapy and she is currently receiving FOLFOX/AVASTIN  She has anemia and is asymptomatic.She is on renal dosing of aranesp  She denies neuropathy. We will proceed with treatment as planned.  Last ferritin was 135 ng/ml. This will continue to be followed.  We will see her back in 2 weeks. Side effects will be closely monitored. Weight over time is realistically stable.   All questions were answered. The patient knows to call the clinic with any problems, questions or concerns. We can certainly see the patient much sooner if necessary.   This document serves as a record of services personally performed by Ancil Linsey, MD. It was created on her behalf by Arlyce Harman, a trained medical scribe. The creation of this record is based on the scribe's personal observations and the provider's statements to them. This document has been checked and approved by the attending provider.  I have reviewed the above documentation for accuracy and completeness, and I agree with the above.  This note was electronically signed.  Kelby Fam. Whitney Muse, MD

## 2015-05-10 NOTE — Patient Instructions (Addendum)
[place] at [facility] Discharge Instructions  RECOMMENDATIONS MADE BY THE CONSULTANT AND ANY TEST RESULTS WILL BE SENT TO YOUR REFERRING PHYSICIAN.    Exam and discussion by Dr Whitney Muse today Treatment today Return Friday to have your pump taken off aranesp every 2 weeks B12 monthly Return to see the doctor in 2 weeks Please call the clinic if you have any questions or concerns   Thank you for choosing [place] at [facility] to provide your oncology and hematology care.  To afford each patient quality time with our provider, please arrive at least 15 minutes before your scheduled appointment time.   Beginning January 23rd 2017 lab work for the [facility] will be done in the  Main lab at [facility] on 1st floor. If you have a lab appointment with the [facility] please come in thru the  Main Entrance and check in at the main information desk  You need to re-schedule your appointment should you arrive 10 or more minutes late.  We strive to give you quality time with our providers, and arriving late affects you and other patients whose appointments are after yours.  Also, if you no show three or more times for appointments you may be dismissed from the clinic at the providers discretion.     Again, thank you for choosing [facility].  Our hope is that these requests will decrease the amount of time that you wait before being seen by our physicians.       _____________________________________________________________  Should you have questions after your visit to [facility], please contact our office at [phone] between the hours of 8:30 a.m. and 4:30 p.m.  Voicemails left after 4:30 p.m. will not be returned until the following business day.  For prescription refill requests, have your pharmacy contact our office.

## 2015-05-10 NOTE — Progress Notes (Signed)
Ocie Stanzione presents today for injection per MD orders. Aranesp 100 mcg administered SQ in left Abdomen. Administration without incident. Patient tolerated well.  Tolerated chemo well. Continuous infusion pump intact. Stable on discharge home with caregiver via wheelchair.

## 2015-05-11 ENCOUNTER — Other Ambulatory Visit (HOSPITAL_COMMUNITY): Payer: Self-pay | Admitting: Oncology

## 2015-05-11 DIAGNOSIS — E611 Iron deficiency: Secondary | ICD-10-CM

## 2015-05-12 ENCOUNTER — Encounter (HOSPITAL_BASED_OUTPATIENT_CLINIC_OR_DEPARTMENT_OTHER): Payer: Medicare Other

## 2015-05-12 ENCOUNTER — Ambulatory Visit (HOSPITAL_COMMUNITY): Payer: Medicare Other

## 2015-05-12 VITALS — BP 158/87 | HR 81 | Temp 98.6°F | Resp 18

## 2015-05-12 DIAGNOSIS — C7800 Secondary malignant neoplasm of unspecified lung: Secondary | ICD-10-CM

## 2015-05-12 DIAGNOSIS — R911 Solitary pulmonary nodule: Secondary | ICD-10-CM

## 2015-05-12 DIAGNOSIS — Z5189 Encounter for other specified aftercare: Secondary | ICD-10-CM

## 2015-05-12 DIAGNOSIS — C20 Malignant neoplasm of rectum: Secondary | ICD-10-CM

## 2015-05-12 MED ORDER — HEPARIN SOD (PORK) LOCK FLUSH 100 UNIT/ML IV SOLN
INTRAVENOUS | Status: AC
  Filled 2015-05-12: qty 5

## 2015-05-12 MED ORDER — HEPARIN SOD (PORK) LOCK FLUSH 100 UNIT/ML IV SOLN
500.0000 [IU] | Freq: Once | INTRAVENOUS | Status: AC | PRN
  Administered 2015-05-12: 500 [IU]

## 2015-05-12 MED ORDER — PEGFILGRASTIM 6 MG/0.6ML ~~LOC~~ PSKT
6.0000 mg | PREFILLED_SYRINGE | Freq: Once | SUBCUTANEOUS | Status: AC
  Administered 2015-05-12: 6 mg via SUBCUTANEOUS

## 2015-05-12 MED ORDER — SODIUM CHLORIDE 0.9 % IJ SOLN
10.0000 mL | INTRAMUSCULAR | Status: DC | PRN
  Administered 2015-05-12: 10 mL
  Filled 2015-05-12: qty 10

## 2015-05-12 MED ORDER — PEGFILGRASTIM 6 MG/0.6ML ~~LOC~~ PSKT
PREFILLED_SYRINGE | SUBCUTANEOUS | Status: AC
  Filled 2015-05-12: qty 0.6

## 2015-05-12 NOTE — Progress Notes (Unsigned)
[name] presented for Portacath access and flush. Proper placement of portacath confirmed by CXR. Portacath located left chest wall accessed with  H 20 needle. Good blood return present. Portacath flushed with 32m NS and 500U/544mHeparin and needle removed intact. Procedure without incident. Patient tolerated procedure well.

## 2015-05-12 NOTE — Patient Instructions (Signed)
[place] at [facility] Discharge Instructions  RECOMMENDATIONS MADE BY THE CONSULTANT AND ANY TEST RESULTS WILL BE SENT TO YOUR REFERRING PHYSICIAN.  Port de-access today with pump removal.   Remove Neulasta OnPro Saturday at [place].    Thank you for choosing [place] at [facility] to provide your oncology and hematology care.  To afford each patient quality time with our provider, please arrive at least 15 minutes before your scheduled appointment time.   Beginning January 23rd 2017 lab work for the [facility] will be done in the  Main lab at [facility] on 1st floor. If you have a lab appointment with the [facility] please come in thru the  Main Entrance and check in at the main information desk  You need to re-schedule your appointment should you arrive 10 or more minutes late.  We strive to give you quality time with our providers, and arriving late affects you and other patients whose appointments are after yours.  Also, if you no show three or more times for appointments you may be dismissed from the clinic at the providers discretion.     Again, thank you for choosing [facility].  Our hope is that these requests will decrease the amount of time that you wait before being seen by our physicians.       _____________________________________________________________  Should you have questions after your visit to [facility], please contact our office at [phone] between the hours of 8:30 a.m. and 4:30 p.m.  Voicemails left after 4:30 p.m. will not be returned until the following business day.  For prescription refill requests, have your pharmacy contact our office.

## 2015-05-22 NOTE — Assessment & Plan Note (Addendum)
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 Roxan Hockey on 10/26/2014 demonstrating recurrent Stage IV disease after undergoing Xeloda+XRT finishing on 05/04/2013, followed by APR on 07/26/2013 by Dr. Arnoldo Morale with marked cytoreduction documented, followed by 6 months worth of Xeloda 1000 mg BID 2 weeks on and 1 week off x 6 months.  Surveillance per NCCN guidelines was followed with serial CT imaging of chest demonstrating an enlarging pulmonary nodule as mentioned previously.  Now on systemic chemotherapy consisting of FOLFOX + Avastin beginning on 01/04/2015.  Oncology history is updated.  Aranesp today as planned at renal dosing.  Hemoglobin today is 10.0 g/dL. Aranesp supportive therapy plan is reviewed and new orders are placed. She will receive Aranesp 100 g today as planned.  Pretreatment labs as ordered. Noted that her platelet count is 93,000. As a result, we will reduce her 5-FU continuous infusion by 15%. She will be treated today. This is cycle #10.  B-12 is due for administration in 2 days when she has her 5-FU pump removed/discontinued.  Return in 2 weeks for follow-up and next treatment.  I called her primary care physician, Dr. Luan Pulling, to give him an update on the patient.

## 2015-05-22 NOTE — Progress Notes (Signed)
[name], MD [address] [place] [address]  Rectal cancer metastasized to lung [facility])  Anemia in chronic renal disease - Plan: DISCONTINUED: Darbepoetin Alfa (ARANESP) injection 100 mcg  CURRENT THERAPY: S/P cycle 9 of adjuvant FOLFOX + Avastin.  Aranesp every 14 days at renal dose.   INTERVAL HISTORY: [name] 65 y.o. female returns for followup of Locally advanced adenocarcinoma of the rectum, She tolerated combined modality therapy well. Radiation therapy completed on 05/04/2013 which was also the last day she received Xeloda, excellent tolerance, status post APR on 07/26/2013 with marked cytoreduction documented as evidenced by no lymph node involvement. pT3 tumor was found. S/P 6 months of Xeloda at 1000 mg BID 2 weeks on and 1 week off finishing in December 2015.  Serial CT imaging demonstrated an enlarging pulmonary nodule resulting in LLL superior segmentectomy by Dr. Roxan Hockey on 10/26/2014 demonstrating oligometastatic disease of rectal adenocarcinoma, resulting in an upstage to Stage IV disease.    Rectal cancer metastasized to lung ([place])   02/18/2013 Initial Diagnosis Rectal cancer   03/25/2013 - 05/05/2013 Radiation Therapy Pelvis treatment from 1/8- 2/12 with rectal boost from 2/13- 05/05/2013.   03/25/2013 - 05/05/2013 Chemotherapy Xeloda 1500 mg BID 5 days/week with radiation therapy.   07/26/2013 Definitive Surgery Dr. Arnoldo Morale- Flexible sigmoidoscopy, abdominoperineal resection, total abdominal hysterectomy with bilateral salpingo-oophorectomy   08/30/2013 - 03/01/2014 Chemotherapy Xeloda 1000 mg BID 14 days on and 7 days off x 6 months   03/08/2014 Imaging CT CAP- Bilateral pulmonary nodules, including an 8 mm left lower lobe nodule. Metastatic disease is a concern. The left lower lobe nodule has progressed since 03/05/2013, when it measured 5 mm.   05/26/2014 Imaging CT Chest- Continued enlargement of the superior segment left lower lobe  nodule. Malignancy is likely.   In contrast, the right lower lobe nodule is probably benign.   08/17/2014 Imaging CT- Superior segment left lower lobe pulmonary nodule measures stable since the most recent comparison study, but is again noted to have increased in size when comparing to older studies. Continued close attention will be required as neoplasm remains a con   10/26/2014 Surgery thoracoscopic left lower lobe superior segmentectomy. Pathology with metastatic adenocarcinoma c/w colonic primary   01/04/2015 -  Chemotherapy FOLFOX+Avastin (Avastin started on 01/18/2015).   01/09/2015 Imaging CT CAP- Interval wedge resection of metastasis within the left lower lobe. No evidence of new metastatic disease within the chest, abdomen or pelvis.   02/15/2015 Treatment Plan Change Treatment deferred due to renal function change (patient poor historian) with N/V.   02/22/2015 Treatment Plan Change Added Aranesp at renal dosing to supportive therpay plan.   03/08/2015 Treatment Plan Change Defer treatment x 1 week   04/12/2015 Adverse Reaction Patient reported mouth sores.  None on exam.     04/12/2015 Treatment Plan Change 5FU bolus is discontinued for "mouth sores"   04/12/2015 Adverse Reaction Increasing Creatinine Cl.  CrCl calculated to be 30.7.     04/12/2015 Treatment Plan Change Oxaliplatin dose reduced by 20% to 65 mg/m2 based upon creatinine clearance.   05/24/2015 Treatment Plan Change 5FU CI decreased by 15%.    I personally reviewed and went over laboratory results with the patient.  The results are noted within this dictation.  Labs will be updated today.   Labs show a platelet count 93,000. She will be treated today but we will decrease her 5-FU continuous infusion by 15%. Oncology history is updated  accordingly. Hemoglobin is 10.0 g/dL. She meets parameters for Aranesp injection. She will receive 1000 g of Aranesp today.  She denies any complaints associated with treatment. She notes bilateral  lower extremity pain but she denies any numbness or tingling. She notes the discomfort is only in her calves. It is not tender to palpation. No unilateral edema. No erythema appreciated or heat. Negative Homans sign bilaterally. She denies any popliteal discomfort. She denies any complaints of her feet. She denies any complaints of her fingertips or hands. She denies a nausea or vomiting. She denies any stomatitis or sores of her mouth. She denies any rashes.  She denies any complaints associated with treatment. We'll continue as planned.   Past Medical History  Diagnosis Date  . Hypertension   . Diabetes mellitus     years  . Asthma   . Depression   . Mental retardation     stopped school at 9th grade   . History of recurrent UTIs   . Shortness of breath     with exertion  . Cancer (HCC)     Rectal   . Arthritis   . Gout   . Seizures ([place])     more than 4 yrs since last seizure. UNknown etiology  . Rectal cancer ([place])   . Iron deficiency 04/27/2014  . Chronic renal disease, stage 3, moderately decreased glomerular filtration rate between 30-59 mL/min/1.73 square meter 04/27/2014  . Numbness and tingling in hands     x several months   . GERD (gastroesophageal reflux disease)   . Vitamin B 12 deficiency 02/01/2015    Incidentally found without antibody testing.      has DIABETES MELLITUS, TYPE II, UNCONTROLLED; HYPERLIPIDEMIA; GOUT NOS; Anemia; DEPRESSION; RETARDATION, MENTAL NOS; Essential hypertension; SINUS TACHYCARDIA; ALLERGIC RHINITIS; ASTHMA; ASTHMA, WITH ACUTE EXACERBATION; DISEASE, PANCREAS NOS; INTERTRIGO, CANDIDAL; ARTHRITIS; SEIZURE DISORDER; UNSPECIFIED SLEEP DISTURBANCE; FLANK PAIN, LEFT; LIVER FUNCTION TESTS, ABNORMAL; MIGRAINES, HX OF; Elevated liver enzymes; Helicobacter pylori gastritis; Rectal cancer metastasized to lung [facility]); Incidental pulmonary nodule, greater than or equal to 92m; Abdominal pain; Insomnia; Iron deficiency; Chronic renal disease, stage 3,  moderately decreased glomerular filtration rate between 30-59 mL/min/1.73 square meter; Lung nodule; and Vitamin B 12 deficiency on her problem list.     has No Known Allergies.  Current Outpatient Prescriptions on File Prior to Visit  Medication Sig Dispense Refill  . amLODipine (NORVASC) 10 MG tablet Take 1 tablet (10 mg total) by mouth every morning. 30 tablet 1  . aspirin EC 81 MG tablet Take 81 mg by mouth daily.    . calcium carbonate (TUMS) 500 MG chewable tablet Chew 2 tablets (400 mg of elemental calcium total) by mouth 3 (three) times daily. 180 tablet 1  . calcium-vitamin D (OSCAL WITH D) 500-200 MG-UNIT tablet Take 2 tablets by mouth daily with breakfast. 60 tablet 5  . clotrimazole-betamethasone (LOTRISONE) cream Apply cream twice a day to breast areas. 45 g 1  . dextrose 5 % SOLN 1,000 mL with fluorouracil 5 GM/100ML SOLN Inject into the vein. Every 14 days over 46 hours    . diphenhydrAMINE (BENADRYL) 25 MG tablet Take 1 tablet (25 mg total) by mouth every 6 (six) hours. 14 tablet 0  . docusate sodium (COLACE) 100 MG capsule Take 100 mg by mouth 2 (two) times daily.    . Emollient (CERAVE) LOTN Apply 1 application topically daily. Apply to both feet    . famotidine (PEPCID) 20 MG tablet Take 1 tablet (20  mg total) by mouth 2 (two) times daily. (Patient not taking: Reported on 04/26/2015) 14 tablet 0  . ferrous sulfate 325 (65 FE) MG tablet TAKE (1) TABLET BY MOUTH TWICE DAILY. 60 tablet 4  . fish oil-omega-3 fatty acids 1000 MG capsule Take 1 g by mouth 2 (two) times daily.    . fluconazole (DIFLUCAN) 100 MG tablet Take 1 tablet (100 mg total) by mouth daily. 10 tablet 0  . Fluticasone-Salmeterol (ADVAIR) 250-50 MCG/DOSE AEPB Inhale 1 puff into the lungs every 12 (twelve) hours.    [name] glucose blood (ACCU-CHEK AVIVA PLUS) test strip 1 each by Other route daily. Use as instructed    . HYDROcodone-acetaminophen (NORCO/VICODIN) 5-325 MG tablet Take 1 tablet by mouth every 4 (four) hours  as needed for moderate pain. For pain not relieved by tramadol 40 tablet 0  . ibuprofen (ADVIL,MOTRIN) 600 MG tablet Take 600 mg by mouth every 6 (six) hours as needed for moderate pain.    . Insulin Glargine (LANTUS SOLOSTAR) 100 UNIT/ML SOPN Inject 12 Units into the skin at bedtime.     [name] ipratropium-albuterol (DUONEB) 0.5-2.5 (3) MG/3ML SOLN     . leucovorin 50 MG injection Inject into the vein. Every 14 days    . lidocaine (XYLOCAINE) 2 % solution TAKE 5 ML BY MOUTH FOUR TIMES DAILY. 360 mL 0  . lidocaine-prilocaine (EMLA) cream Apply a quarter size amount to port site 1 hour prior to chemo. Do not rub in. Cover with plastic wrap. 30 g 3  . lisinopril (PRINIVIL,ZESTRIL) 10 MG tablet Take 1 tablet (10 mg total) by mouth daily. 30 tablet 1  . loperamide (IMODIUM) 2 MG capsule Take 2 caps after 1st loose stool and then 1 cap q2h until 12 hours have passed without having a loose stool. At bedtime, take 2 caps. Then 2 caps q4 h until morning. 45 capsule 0  . loratadine-pseudoephedrine (LORATADINE-D 24HR) 10-240 MG per 24 hr tablet Take 1 tablet by mouth daily.    . magic mouthwash w/lidocaine SOLN Take 5 mLs by mouth 4 (four) times daily. 360 mL 1  . metFORMIN (GLUCOPHAGE) 1000 MG tablet Take 1,000 mg by mouth 2 (two) times daily with a meal.    . metoprolol succinate (TOPROL-XL) 50 MG 24 hr tablet Take 100 mg by mouth at bedtime.     . montelukast (SINGULAIR) 10 MG tablet Take 10 mg by mouth at bedtime.    . ondansetron (ZOFRAN) 8 MG tablet Take 1 tablet (8 mg total) by mouth every 8 (eight) hours as needed for nausea or vomiting. #1 nausea med to take 30 tablet 2  . OXALIPLATIN IV Inject into the vein every 14 (fourteen) days. Starting 10/19    . pantoprazole (PROTONIX) 40 MG tablet Take 40 mg by mouth every morning.     . Pegfilgrastim (NEULASTA ONPRO [place]) Inject into the skin. Every 14 days    . phenytoin (DILANTIN) 100 MG ER capsule Take 100 mg by mouth 2 (two) times daily.    . pravastatin  (PRAVACHOL) 40 MG tablet Take 40 mg by mouth every evening.     [name] PROAIR HFA 108 (90 BASE) MCG/ACT inhaler     . prochlorperazine (COMPAZINE) 10 MG tablet Take 1 tablet (10 mg total) by mouth every 6 (six) hours as needed for nausea or vomiting. #2 nausea med to take 30 tablet 2  . silver sulfADIAZINE (SILVADENE) 1 % cream Apply 1 application topically every morning.     [name]  sodium chloride (OCEAN) 0.65 % SOLN nasal spray Place 2 sprays into both nostrils 4 (four) times daily as needed for congestion.    . sodium polystyrene (KAYEXALATE) powder Take by mouth once. 30 g 0  . traMADol (ULTRAM) 50 MG tablet Take 1 tablet (50 mg total) by mouth every 4 (four) hours as needed for moderate pain. 30 tablet 2  . traZODone (DESYREL) 50 MG tablet     . triamcinolone cream (KENALOG) 0.1 % Apply 1 application topically 2 (two) times daily. To stoma     Current Facility-Administered Medications on File Prior to Visit  Medication Dose Route Frequency Provider Last Rate Last Dose  . bevacizumab (AVASTIN) 525 mg in sodium chloride 0.9 % 100 mL chemo infusion  5 mg/kg (Treatment Plan Actual) Intravenous Once Patrici Ranks, MD      . leucovorin 868 mg in dextrose 5 % 250 mL infusion  400 mg/m2 (Treatment Plan Actual) Intravenous Once Patrici Ranks, MD      . ondansetron (ZOFRAN) 8 mg, dexamethasone (DECADRON) 10 mg in sodium chloride 0.9 % 50 mL IVPB   Intravenous Once Patrici Ranks, MD      . oxaliplatin (ELOXATIN) 140 mg in dextrose 5 % 500 mL chemo infusion  65.45 mg/m2 (Treatment Plan Actual) Intravenous Once Patrici Ranks, MD      . sodium chloride 0.9 % injection 10 mL  10 mL Intracatheter PRN Patrici Ranks, MD   10 mL at 05/12/15 1442  . sodium chloride 0.9 % injection 10 mL  10 mL Intracatheter PRN Patrici Ranks, MD   10 mL at 05/24/15 1200    Past Surgical History  Procedure Laterality Date  . Abdominal hysterectomy    . Multiple extractions with alveoloplasty N/A 11/23/2012     Procedure: MULTIPLE EXTRACION 1, 2, 4, 5, 6, 7, 8, 9, 10, 11, 12, 13, 14, 17, 18, 20, 23, 24, 25, 26, 28, 29, 32 WITH ALVEOLOPLASTY, REMOVE BILATERAL TORI;  Surgeon: Gae Bon, DDS;  Location: [place];  Service: Oral Surgery;  Laterality: N/A;  . Colonoscopy with esophagogastroduodenoscopy (egd) N/A 01/14/2013    Procedure: COLONOSCOPY WITH ESOPHAGOGASTRODUODENOSCOPY (EGD);  Surgeon: Rogene Houston, MD;  [facility];  Service: Endoscopy;  Laterality: N/A;  250-moved to 315 Ann to notify pt  . Colonoscopy N/A 02/04/2013    Procedure: COLONOSCOPY;  Surgeon: Rogene Houston, MD;  [facility];  Service: Endoscopy;  Laterality: N/A;  225  . Eus N/A 02/18/2013    Procedure: LOWER ENDOSCOPIC ULTRASOUND (EUS);  Surgeon: Milus Banister, MD;  Location: [facility];  Service: Endoscopy;  Laterality: N/A;  . Flexible sigmoidoscopy N/A 07/26/2013    Procedure: FLEXIBLE SIGMOIDOSCOPY;  Surgeon: Jamesetta So, MD;  [facility];  Service: General;  Laterality: N/A;  . Abdominal perineal bowel resection N/A 07/26/2013    Procedure:  ABDOMINAL PERINEAL RESECTION;  Surgeon: Jamesetta So, MD;  [facility];  Service: General;  Laterality: N/A;  . Supracervical abdominal hysterectomy N/A 07/26/2013    Procedure: HYSTERECTOMY SUPRACERVICAL ABDOMINAL ;  Surgeon: Jamesetta So, MD;  [facility];  Service: General;  Laterality: N/A;  . Salpingoophorectomy Bilateral 07/26/2013    Procedure: SALPINGO OOPHORECTOMY;  Surgeon: Jamesetta So, MD;  [facility];  Service: General;  Laterality: Bilateral;  . Colostomy Left 07/26/2013    Procedure: COLOSTOMY;  Surgeon: Jamesetta So, MD;  [facility];  Service: General;  Laterality:  Left;  . Colonoscopy N/A 05/12/2014    Procedure: COLONOSCOPY;  Surgeon: Rogene Houston, MD;  [facility];  Service: Endoscopy;  Laterality: N/A;  1030  . Colon surgery  2015    bowel resection/w colostomy  . Video assisted thoracoscopy  Left 10/26/2014    Procedure: LEFT VIDEO ASSISTED THORACOSCOPY;  Surgeon: [name], MD;  Location: [facility];  Service: Thoracic;  Laterality: Left;  . Segmentecomy Left 10/26/2014    Procedure: LEFT LOWER LOBE SUPERIOR SEGMENTECTOMY;  Surgeon: [name], MD;  Location: [facility];  Service: Thoracic;  Laterality: Left;  . Lymph node dissection Left 10/26/2014    Procedure: LYMPH NODE DISSECTION;  Surgeon: [name], MD;  Location: [facility];  Service: Thoracic;  Laterality: Left;  . Portacath placement Left 01/02/2015    Procedure: INSERTION PORT-A-CATH;  Surgeon: Aviva Signs, MD;  [facility];  Service: General;  Laterality: Left;    Denies any headaches, dizziness, double vision, fevers, chills, night sweats, vomiting, diarrhea, constipation, chest pain, heart palpitations, shortness of breath, blood in stool, black tarry stool, urinary pain, urinary burning, urinary frequency, hematuria.   PHYSICAL EXAMINATION  ECOG PERFORMANCE STATUS: 2 - Symptomatic, <50% confined to bed  There were no vitals filed for this visit.  GENERAL:alert, no distress, well nourished, comfortable, cooperative, obese, smiling and unaccompanied today and in a chemo-recliner. SKIN: skin color, texture, turgor are normal.Thickened skin of the palms bilaterally HEAD: Normocephalic, No masses, lesions, tenderness or abnormalities EYES: normal, PERRLA, EOMI, Conjunctiva are pink and non-injected EARS: External ears normal OROPHARYNX:lips, buccal mucosa, and tongue normal and mucous membranes are moist  NECK: supple, no adenopathy, trachea midline LYMPH:  no palpable lymphadenopathy BREAST:not examined LUNGS: clear to auscultation without any wheezes, rales, or rhonchi. HEART: regular rate & rhythm without any murmurs, rubs, gallop. S1 and S2 are normal. ABDOMEN:abdomen soft, non-tender, obese and normal bowel sounds BACK: Back symmetric, no curvature., No CVA tenderness EXTREMITIES:less  then 2 second capillary refill, no joint deformities, effusion, or inflammation, no skin discoloration, no cyanosis, darkening of fingernails at the base, B/L LE trace edema with compression stockings in place, equal in size. No erythema, heat, tenderness to palpation, and negative Homans sign bilaterally.  NEURO: no focal motor/sensory deficits, gait normal   LABORATORY DATA: CBC    Component Value Date/Time   WBC 9.0 05/24/2015 1033   RBC 3.34* 05/24/2015 1033   RBC 3.84* 11/07/2009 1649   HGB 10.0* 05/24/2015 1033   HCT 32.2* 05/24/2015 1033   PLT 93* 05/24/2015 1033   MCV 96.4 05/24/2015 1033   MCH 29.9 05/24/2015 1033   MCHC 31.1 05/24/2015 1033   RDW 17.5* 05/24/2015 1033   LYMPHSABS 0.6* 05/24/2015 1033   MONOABS 0.5 05/24/2015 1033   EOSABS 0.2 05/24/2015 1033   BASOSABS 0.0 05/24/2015 1033      Chemistry      Component Value Date/Time   NA 139 05/24/2015 1033   K 4.8 05/24/2015 1033   CL 104 05/24/2015 1033   CO2 29 05/24/2015 1033   BUN 24* 05/24/2015 1033   CREATININE 1.13* 05/24/2015 1033   CREATININE 1.11* 02/08/2013 1451      Component Value Date/Time   CALCIUM 8.3* 05/24/2015 1033   ALKPHOS 162* 05/24/2015 1033   AST 21 05/24/2015 1033   ALT 20 05/24/2015 1033   BILITOT 0.4 05/24/2015 1033      Lab Results  Component Value Date   CEA 4.5 01/04/2015   Lab  Results  Component Value Date   FERRITIN 247 05/10/2015     PENDING LABS:   RADIOGRAPHIC STUDIES:  No results found.   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 Roxan Hockey on 10/26/2014 demonstrating recurrent Stage IV disease after undergoing Xeloda+XRT finishing on 05/04/2013, followed by APR on 07/26/2013 by Dr. Arnoldo Morale with marked cytoreduction documented, followed by 6 months worth of Xeloda 1000 mg BID 2 weeks on and 1 week off x 6 months.   Surveillance per NCCN guidelines was followed with serial CT imaging of chest demonstrating an enlarging pulmonary nodule as mentioned previously.  Now on systemic chemotherapy consisting of FOLFOX + Avastin beginning on 01/04/2015.  Oncology history is updated.  Aranesp today as planned at renal dosing.  Hemoglobin today is 10.0 g/dL. Aranesp supportive therapy plan is reviewed and new orders are placed. She will receive Aranesp 100 g today as planned.  Pretreatment labs as ordered. Noted that her platelet count is 93,000. As a result, we will reduce her 5-FU continuous infusion by 15%. She will be treated today. This is cycle #10.  B-12 is due for administration in 2 days when she has her 5-FU pump removed/discontinued.  Return in 2 weeks for follow-up and next treatment.  I called her primary care physician, Dr. Luan Pulling, to give him an update on the patient.   THERAPY PLAN:  Continue with adjuvant FOLFOX + Avastin.    All questions were answered. The patient knows to call the clinic with any problems, questions or concerns. We can certainly see the patient much sooner if necessary.  Patient and plan discussed with Dr. Ancil Linsey and she is in agreement with the aforementioned.   This note is electronically signed by: Doy Mince 05/24/2015 12:22 PM

## 2015-05-24 ENCOUNTER — Encounter (HOSPITAL_BASED_OUTPATIENT_CLINIC_OR_DEPARTMENT_OTHER): Payer: Medicare Other | Admitting: Oncology

## 2015-05-24 ENCOUNTER — Ambulatory Visit (HOSPITAL_COMMUNITY): Payer: Medicare Other | Admitting: Oncology

## 2015-05-24 ENCOUNTER — Encounter (HOSPITAL_COMMUNITY): Payer: Self-pay | Admitting: Hematology & Oncology

## 2015-05-24 ENCOUNTER — Encounter (HOSPITAL_COMMUNITY): Payer: Medicare Other

## 2015-05-24 ENCOUNTER — Encounter (HOSPITAL_COMMUNITY): Payer: Self-pay | Admitting: Oncology

## 2015-05-24 ENCOUNTER — Encounter (HOSPITAL_COMMUNITY): Payer: Medicare Other | Attending: Hematology and Oncology

## 2015-05-24 VITALS — BP 175/83 | HR 78 | Temp 98.8°F | Resp 20 | Wt 222.4 lb

## 2015-05-24 DIAGNOSIS — E538 Deficiency of other specified B group vitamins: Secondary | ICD-10-CM

## 2015-05-24 DIAGNOSIS — N189 Chronic kidney disease, unspecified: Secondary | ICD-10-CM

## 2015-05-24 DIAGNOSIS — C7800 Secondary malignant neoplasm of unspecified lung: Secondary | ICD-10-CM

## 2015-05-24 DIAGNOSIS — Z5112 Encounter for antineoplastic immunotherapy: Secondary | ICD-10-CM

## 2015-05-24 DIAGNOSIS — C20 Malignant neoplasm of rectum: Secondary | ICD-10-CM | POA: Insufficient documentation

## 2015-05-24 DIAGNOSIS — D631 Anemia in chronic kidney disease: Secondary | ICD-10-CM | POA: Diagnosis not present

## 2015-05-24 DIAGNOSIS — Z5111 Encounter for antineoplastic chemotherapy: Secondary | ICD-10-CM

## 2015-05-24 DIAGNOSIS — D649 Anemia, unspecified: Secondary | ICD-10-CM | POA: Diagnosis present

## 2015-05-24 DIAGNOSIS — R911 Solitary pulmonary nodule: Secondary | ICD-10-CM

## 2015-05-24 LAB — COMPREHENSIVE METABOLIC PANEL
ALK PHOS: 162 U/L — AB (ref 38–126)
ALT: 20 U/L (ref 14–54)
AST: 21 U/L (ref 15–41)
Albumin: 3.8 g/dL (ref 3.5–5.0)
Anion gap: 6 (ref 5–15)
BILIRUBIN TOTAL: 0.4 mg/dL (ref 0.3–1.2)
BUN: 24 mg/dL — AB (ref 6–20)
CALCIUM: 8.3 mg/dL — AB (ref 8.9–10.3)
CO2: 29 mmol/L (ref 22–32)
CREATININE: 1.13 mg/dL — AB (ref 0.44–1.00)
Chloride: 104 mmol/L (ref 101–111)
GFR calc Af Amer: 59 mL/min — ABNORMAL LOW (ref >60)
GFR, EST NON AFRICAN AMERICAN: 51 mL/min — AB (ref >60)
Glucose, Bld: 144 mg/dL — ABNORMAL HIGH (ref 65–99)
POTASSIUM: 4.8 mmol/L (ref 3.5–5.1)
Sodium: 139 mmol/L (ref 135–145)
TOTAL PROTEIN: 7.2 g/dL (ref 6.5–8.1)

## 2015-05-24 LAB — CBC WITH DIFFERENTIAL/PLATELET
BASOS ABS: 0 10*3/uL (ref 0.0–0.1)
Basophils Relative: 0 %
Eosinophils Absolute: 0.2 10*3/uL (ref 0.0–0.7)
Eosinophils Relative: 2 %
HEMATOCRIT: 32.2 % — AB (ref 36.0–46.0)
HEMOGLOBIN: 10 g/dL — AB (ref 12.0–15.0)
LYMPHS PCT: 7 %
Lymphs Abs: 0.6 10*3/uL — ABNORMAL LOW (ref 0.7–4.0)
MCH: 29.9 pg (ref 26.0–34.0)
MCHC: 31.1 g/dL (ref 30.0–36.0)
MCV: 96.4 fL (ref 78.0–100.0)
MONO ABS: 0.5 10*3/uL (ref 0.1–1.0)
MONOS PCT: 6 %
NEUTROS ABS: 7.6 10*3/uL (ref 1.7–7.7)
Neutrophils Relative %: 85 %
Platelets: 93 10*3/uL — ABNORMAL LOW (ref 150–400)
RBC: 3.34 MIL/uL — ABNORMAL LOW (ref 3.87–5.11)
RDW: 17.5 % — AB (ref 11.5–15.5)
WBC: 9 10*3/uL (ref 4.0–10.5)

## 2015-05-24 LAB — URINALYSIS, DIPSTICK ONLY
BILIRUBIN URINE: NEGATIVE
GLUCOSE, UA: NEGATIVE mg/dL
KETONES UR: NEGATIVE mg/dL
Leukocytes, UA: NEGATIVE
Nitrite: NEGATIVE
PROTEIN: 30 mg/dL — AB
Specific Gravity, Urine: 1.015 (ref 1.005–1.030)
pH: 7 (ref 5.0–8.0)

## 2015-05-24 MED ORDER — DEXTROSE 5 % IV SOLN
Freq: Once | INTRAVENOUS | Status: AC
  Administered 2015-05-24: 12:00:00 via INTRAVENOUS

## 2015-05-24 MED ORDER — DARBEPOETIN ALFA 100 MCG/0.5ML IJ SOSY
100.0000 ug | PREFILLED_SYRINGE | Freq: Once | INTRAMUSCULAR | Status: AC
  Administered 2015-05-24: 100 ug via SUBCUTANEOUS

## 2015-05-24 MED ORDER — LEUCOVORIN CALCIUM INJECTION 350 MG
400.0000 mg/m2 | Freq: Once | INTRAVENOUS | Status: AC
  Administered 2015-05-24: 868 mg via INTRAVENOUS
  Filled 2015-05-24: qty 43.4

## 2015-05-24 MED ORDER — DARBEPOETIN ALFA 100 MCG/0.5ML IJ SOSY
PREFILLED_SYRINGE | INTRAMUSCULAR | Status: AC
Start: 2015-05-24 — End: 2015-05-24
  Filled 2015-05-24: qty 0.5

## 2015-05-24 MED ORDER — SODIUM CHLORIDE 0.9 % IV SOLN
Freq: Once | INTRAVENOUS | Status: AC
  Administered 2015-05-24: 12:00:00 via INTRAVENOUS

## 2015-05-24 MED ORDER — SODIUM CHLORIDE 0.9 % IJ SOLN
10.0000 mL | INTRAMUSCULAR | Status: DC | PRN
  Administered 2015-05-24: 10 mL
  Filled 2015-05-24: qty 10

## 2015-05-24 MED ORDER — SODIUM CHLORIDE 0.9 % IV SOLN
Freq: Once | INTRAVENOUS | Status: AC
  Administered 2015-05-24: 12:00:00 via INTRAVENOUS
  Filled 2015-05-24: qty 4

## 2015-05-24 MED ORDER — FLUOROURACIL CHEMO INJECTION 5 GM/100ML
2040.0000 mg/m2 | INTRAVENOUS | Status: DC
  Administered 2015-05-24: 4450 mg via INTRAVENOUS
  Filled 2015-05-24: qty 89

## 2015-05-24 MED ORDER — OXALIPLATIN CHEMO INJECTION 100 MG/20ML
65.4500 mg/m2 | Freq: Once | INTRAVENOUS | Status: AC
  Administered 2015-05-24: 140 mg via INTRAVENOUS
  Filled 2015-05-24: qty 28

## 2015-05-24 MED ORDER — SODIUM CHLORIDE 0.9 % IV SOLN
5.0000 mg/kg | Freq: Once | INTRAVENOUS | Status: AC
  Administered 2015-05-24: 525 mg via INTRAVENOUS
  Filled 2015-05-24: qty 16

## 2015-05-24 NOTE — Progress Notes (Signed)
[name] presents today for injection per MD orders. Aranesp 100 mcg administered SQ in right Abdomen. Administration without incident. Patient tolerated well.   Tolerated chemo well. Continuous infusion pump intact. Stable on discharge home with caregiver via wheelchair.

## 2015-05-24 NOTE — Patient Instructions (Signed)
[place] at [facility] Discharge Instructions  RECOMMENDATIONS MADE BY THE CONSULTANT AND ANY TEST RESULTS WILL BE SENT TO YOUR REFERRING PHYSICIAN.  Exam done and seen by Kirby Crigler today Follow up as scheduled. Tom in 2weeks, see DR.Penland in 4weeks.  Thank you for choosing [place] at [facility] to provide your oncology and hematology care.  To afford each patient quality time with our provider, please arrive at least 15 minutes before your scheduled appointment time.   Beginning January 23rd 2017 lab work for the [facility] will be done in the  Main lab at [facility] on 1st floor. If you have a lab appointment with the [facility] please come in thru the  Main Entrance and check in at the main information desk  You need to re-schedule your appointment should you arrive 10 or more minutes late.  We strive to give you quality time with our providers, and arriving late affects you and other patients whose appointments are after yours.  Also, if you no show three or more times for appointments you may be dismissed from the clinic at the providers discretion.     Again, thank you for choosing [facility].  Our hope is that these requests will decrease the amount of time that you wait before being seen by our physicians.       _____________________________________________________________  Should you have questions after your visit to [facility], please contact our office at [phone] between the hours of 8:30 a.m. and 4:30 p.m.  Voicemails left after 4:30 p.m. will not be returned until the following business day.  For prescription refill requests, have your pharmacy contact our office.         Resources For Cancer Patients and their Caregivers ? American Cancer Society: Can assist with transportation, wigs, general needs, runs Look Good Feel Better.        2045653152 ? Cancer Care: Provides  financial assistance, online support groups, medication/co-pay assistance.  1-800-813-HOPE 6166640650) ? [place] Assists [place] cancer patients and their families through emotional , educational and financial support.  561-692-2100 ? [place] DSS Where to apply for food stamps, Medicaid and utility assistance. 513-177-1704 ? RCATS: Transportation to medical appointments. 630-250-3293 ? Social Security Administration: May apply for disability if have a Stage IV cancer. 971-804-4846 [phone] ? [employer], Disability and Transit Services: Assists with nutrition, care and transit needs. [phone]

## 2015-05-24 NOTE — Patient Instructions (Signed)
[facility] Discharge Instructions for Patients Receiving Chemotherapy   Beginning January 23rd 2017 lab work for the [facility] will be done in the  Main lab at [facility] on 1st floor. If you have a lab appointment with the [facility] please come in thru the  Main Entrance and check in at the main information desk   Today you received the following chemotherapy agents Avastin,Oxaliplatin,Leucovorin and 5FU pump. You also received Aranesp 100 mcg injection for hemoglobin 10.0.  To help prevent nausea and vomiting after your treatment, we encourage you to take your nausea medication as instructed. If you develop nausea and vomiting, or diarrhea that is not controlled by your medication, call the clinic.  The clinic phone number is [phone]. Office hours are Monday-Friday 8:30am-5:00pm.  BELOW ARE SYMPTOMS THAT SHOULD BE REPORTED IMMEDIATELY:  *FEVER GREATER THAN 101.0 F  *CHILLS WITH OR WITHOUT FEVER  NAUSEA AND VOMITING THAT IS NOT CONTROLLED WITH YOUR NAUSEA MEDICATION  *UNUSUAL SHORTNESS OF BREATH  *UNUSUAL BRUISING OR BLEEDING  TENDERNESS IN MOUTH AND THROAT WITH OR WITHOUT PRESENCE OF ULCERS  *URINARY PROBLEMS  *BOWEL PROBLEMS  UNUSUAL RASH Items with * indicate a potential emergency and should be followed up as soon as possible. If you have an emergency after office hours please contact your primary care physician or go to the nearest emergency department.  Please call the clinic during office hours if you have any questions or concerns.   You may also contact the Patient Navigator at [phone] should you have any questions or need assistance in obtaining follow up care.   Resources For Cancer Patients and their Caregivers ? American Cancer Society: Can assist with transportation, wigs, general needs, runs Look Good Feel Better.        505-089-0070 ? Cancer Care: Provides financial assistance, online support groups,  medication/co-pay assistance.  1-800-813-HOPE 219-757-0478) ? [place] Assists [place] cancer patients and their families through emotional , educational and financial support.  [phone] ? [place] DSS Where to apply for food stamps, Medicaid and utility assistance. 818-508-4981 ? RCATS: Transportation to medical appointments. 7758611892 ? Social Security Administration: May apply for disability if have a Stage IV cancer. 903-041-6755 223-133-6196 ? [employer], Disability and Transit Services: Assists with nutrition, care and transit needs. 9841427751

## 2015-05-26 ENCOUNTER — Encounter (HOSPITAL_BASED_OUTPATIENT_CLINIC_OR_DEPARTMENT_OTHER): Payer: Medicare Other

## 2015-05-26 ENCOUNTER — Encounter (HOSPITAL_COMMUNITY): Payer: Medicare Other

## 2015-05-26 ENCOUNTER — Ambulatory Visit (HOSPITAL_COMMUNITY): Payer: Medicare Other

## 2015-05-26 ENCOUNTER — Encounter (HOSPITAL_COMMUNITY): Payer: Self-pay

## 2015-05-26 VITALS — BP 153/68 | HR 76 | Temp 98.4°F | Resp 20

## 2015-05-26 DIAGNOSIS — E538 Deficiency of other specified B group vitamins: Secondary | ICD-10-CM | POA: Diagnosis not present

## 2015-05-26 DIAGNOSIS — C20 Malignant neoplasm of rectum: Secondary | ICD-10-CM

## 2015-05-26 DIAGNOSIS — C7800 Secondary malignant neoplasm of unspecified lung: Secondary | ICD-10-CM

## 2015-05-26 DIAGNOSIS — R911 Solitary pulmonary nodule: Secondary | ICD-10-CM

## 2015-05-26 MED ORDER — HEPARIN SOD (PORK) LOCK FLUSH 100 UNIT/ML IV SOLN
500.0000 [IU] | Freq: Once | INTRAVENOUS | Status: AC | PRN
  Administered 2015-05-26: 500 [IU]

## 2015-05-26 MED ORDER — SODIUM CHLORIDE 0.9 % IJ SOLN
10.0000 mL | INTRAMUSCULAR | Status: DC | PRN
  Administered 2015-05-26: 10 mL
  Filled 2015-05-26: qty 10

## 2015-05-26 MED ORDER — CYANOCOBALAMIN 1000 MCG/ML IJ SOLN
1000.0000 ug | Freq: Once | INTRAMUSCULAR | Status: AC
  Administered 2015-05-26: 1000 ug via INTRAMUSCULAR

## 2015-05-26 NOTE — Progress Notes (Signed)
1440:  [name] presents to have home infusion pump d/c'd and for port-a-cath deaccess with flush. Portacath located left chest wall accessed with  H 20 needle.  Good blood return present. Portacath flushed with NS 10 ml and 500U/23m Heparin, and needle removed intact.  Procedure tolerated well and without incident.  Jennifer Dauriapresents today for injection per the provider's orders.  B12  administration without incident; see MAR for injection details.  Patient tolerated procedure well and without incident.  No questions or complaints noted at this time.  Pt was discharged prior to receiving Neulasta OnPro.  Dr. PWhitney Musemade [place]  Per MD, if pt can return to clinic today to receive injection, we can give it, but if she cannot return due to transportation, then she is okay to miss this dose.  Spoke with Vernell and informed her of the situation.  She does not have transportation for [name] to return to the clinic.  She is informed of [facility] ANC.  Instructed to call the clinic should they have any questions or concerns.

## 2015-05-26 NOTE — Progress Notes (Signed)
See injection encounter. 

## 2015-05-26 NOTE — Patient Instructions (Signed)
[place] at [facility] Discharge Instructions  RECOMMENDATIONS MADE BY THE CONSULTANT AND ANY TEST RESULTS WILL BE SENT TO YOUR REFERRING PHYSICIAN.  Infusion pump removal and port flush today. B12 injection today. Return as scheduled for chemotherapy and office visit.   Thank you for choosing [place] at [facility] to provide your oncology and hematology care.  To afford each patient quality time with our provider, please arrive at least 15 minutes before your scheduled appointment time.   Beginning January 23rd 2017 lab work for the [facility] will be done in the  Main lab at [facility] on 1st floor. If you have a lab appointment with the [facility] please come in thru the  Main Entrance and check in at the main information desk  You need to re-schedule your appointment should you arrive 10 or more minutes late.  We strive to give you quality time with our providers, and arriving late affects you and other patients whose appointments are after yours.  Also, if you no show three or more times for appointments you may be dismissed from the clinic at the providers discretion.     Again, thank you for choosing [facility].  Our hope is that these requests will decrease the amount of time that you wait before being seen by our physicians.       _____________________________________________________________  Should you have questions after your visit to [facility], please contact our office at [phone] between the hours of 8:30 a.m. and 4:30 p.m.  Voicemails left after 4:30 p.m. will not be returned until the following business day.  For prescription refill requests, have your pharmacy contact our office.         Resources For Cancer Patients and their Caregivers ? American Cancer Society: Can assist with transportation, wigs, general needs, runs Look Good Feel Better.        907-754-4391 ? Cancer  Care: Provides financial assistance, online support groups, medication/co-pay assistance.  1-800-813-HOPE (226)351-7235) ? [place] Assists [place] cancer patients and their families through emotional , educational and financial support.  (615)756-6100 ? [place] DSS Where to apply for food stamps, Medicaid and utility assistance. 4348772092 ? [employer]: Transportation to medical appointments. 201-765-7228 ? Social Security Administration: May apply for disability if have a Stage IV cancer. 781-233-1466 [phone] ? [employer], Disability and Transit Services: Assists with nutrition, care and transit needs. [phone]

## 2015-05-29 MED ORDER — AZITHROMYCIN 250 MG PO TABS
ORAL_TABLET | ORAL | Status: DC
Start: 2015-05-29 — End: 2016-06-28

## 2015-05-29 NOTE — Progress Notes (Signed)
Spoke with Vernell - she states that [place] has a "cold" -  productive cough, stuffy nose, NO fever.  States they have been giving pt diabetic Tussin and neb tx QID.  Also reports pt is constipated. Dr. Whitney Muse notified.  Z pak sent to pts pharmacy.  Vernell notified of Rx sent to pharmacy given instructions to use Miralax 1-2 times daily as needed for constipation, and to add Senekot S, starting with one tablet twice a day, and may increase to 4 tablets twice a day if needed for constipation. Instructed to call the clinic if cough or constipation are not improved.

## 2015-05-30 ENCOUNTER — Other Ambulatory Visit (HOSPITAL_COMMUNITY): Payer: Self-pay | Admitting: Emergency Medicine

## 2015-05-30 DIAGNOSIS — C20 Malignant neoplasm of rectum: Secondary | ICD-10-CM

## 2015-05-30 DIAGNOSIS — C7800 Secondary malignant neoplasm of unspecified lung: Principal | ICD-10-CM

## 2015-05-30 MED ORDER — MAGIC MOUTHWASH W/LIDOCAINE: 5.0000 mL | Freq: Four times a day (QID) | ORAL | Status: DC

## 2015-05-30 MED ORDER — SENNOSIDES-DOCUSATE SODIUM 8.6-50 MG PO TABS: 1.0000 | ORAL_TABLET | Freq: Two times a day (BID) | ORAL | Status: DC | PRN

## 2015-05-30 MED ORDER — POLYETHYLENE GLYCOL 3350 17 G PO PACK: 17.0000 g | PACK | Freq: Every day | ORAL | Status: DC

## 2015-06-06 NOTE — Assessment & Plan Note (Addendum)
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 Roxan Hockey on 10/26/2014 demonstrating recurrent Stage IV disease after undergoing Xeloda+XRT finishing on 05/04/2013, followed by APR on 07/26/2013 by Dr. Arnoldo Morale with marked cytoreduction documented, followed by 6 months worth of Xeloda 1000 mg BID 2 weeks on and 1 week off x 6 months.  Surveillance per NCCN guidelines was followed with serial CT imaging of chest demonstrating an enlarging pulmonary nodule as mentioned previously.  Now on systemic chemotherapy consisting of FOLFOX + Avastin beginning on 01/04/2015.  Oncology history is up to date.  Labs meet treatment parameters today and therefore, she will receive her 11th cycles of treatment.  Her WBC and ANC are noted and Neulasta OnPro is built into her treatment plan for day #3.  Aranesp today as planned at renal dosing.   B12 given on 05/26/2015 and she will be due for her injection on ~ 06/23/2015.  I will ask nursing to ascertain a magnesium level today.  If low, will ordered IV mag sulfate today.  Labs in 2 weeks prior to treatment #12: CBC diff, CMET, UA, CEA, ferritin, magnesium.  Return in 2 weeks for follow-up and next treatment.  Addendum: Hypomagnesemia noted.  2 g of IV magnesium ordered and given today.

## 2015-06-06 NOTE — Progress Notes (Signed)
Rescheduled

## 2015-06-06 NOTE — Progress Notes (Addendum)
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  Aranesp every 14 days at renal dose.   INTERVAL HISTORY: [name] 65 y.o. female returns for followup of Locally advanced adenocarcinoma of the rectum, She tolerated combined modality therapy well. Radiation therapy completed on 05/04/2013 which was also the last day she received Xeloda, excellent tolerance, status post APR on 07/26/2013 with marked cytoreduction documented as evidenced by no lymph node involvement. pT3 tumor was found. S/P 6 months of Xeloda at 1000 mg BID 2 weeks on and 1 week off finishing in December 2015.  Serial CT imaging demonstrated an enlarging pulmonary nodule resulting in LLL superior segmentectomy by Dr. Roxan Hockey on 10/26/2014 demonstrating oligometastatic disease of rectal adenocarcinoma, resulting in an upstage to Stage IV disease.    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   03/25/2013 - 05/05/2013 Chemotherapy Xeloda 1500 mg BID 5 days/week with radiation therapy.   07/26/2013 Definitive Surgery Dr. Arnoldo Morale- Flexible sigmoidoscopy, abdominoperineal resection, total abdominal hysterectomy with bilateral salpingo-oophorectomy   08/30/2013 - 03/01/2014 Chemotherapy Xeloda 1000 mg BID 14 days on and 7 days off x 6 months   03/08/2014 Imaging CT CAP- Bilateral pulmonary nodules, including an 8 mm left lower lobe nodule. Metastatic disease is a concern. The left lower lobe nodule has progressed since 03/05/2013, when it measured 5 mm.   05/26/2014 Imaging CT Chest- Continued enlargement of the superior segment left lower lobe nodule. Malignancy is likely.   In contrast, the right lower lobe nodule is probably benign.   08/17/2014 Imaging CT- Superior segment left lower lobe pulmonary nodule measures stable since the most recent comparison study, but is again noted to have increased in size when comparing to older studies. Continued close attention will be required as neoplasm remains a con   10/26/2014 Surgery thoracoscopic left lower lobe superior segmentectomy. Pathology with metastatic adenocarcinoma c/w colonic primary   01/04/2015 -  Chemotherapy FOLFOX+Avastin (Avastin started on 01/18/2015).   01/09/2015 Imaging CT CAP- Interval wedge resection of metastasis within the left lower lobe. No evidence of new metastatic disease within the chest, abdomen or pelvis.   02/15/2015 Treatment Plan Change Treatment deferred due to renal function change (patient poor historian) with N/V.   02/22/2015 Treatment Plan Change Added Aranesp at renal dosing to supportive therpay plan.   03/08/2015 Treatment Plan Change Defer treatment x 1 week   04/12/2015 Adverse Reaction Patient reported mouth sores.  None on exam.     04/12/2015 Treatment Plan Change 5FU bolus is discontinued for "mouth sores"   04/12/2015 Adverse Reaction Increasing Creatinine Cl.  CrCl calculated to be 30.7.     04/12/2015 Treatment Plan Change Oxaliplatin dose reduced by 20% to 65 mg/m2 based upon creatinine clearance.   05/24/2015 Treatment Plan Change 5FU CI decreased by 15%.    I personally reviewed and went over laboratory results with the patient.  The results are noted within this dictation.  Labs will be updated today.   Labs satisfy treatment parameters today. OnPro is built into her treatment plan for the day  her 5FU pump is discontinued.  ANC is 1.8.  She is asked very simple questions regarding symptoms of peripheral neuropathy. She denies any of the symptoms. She  continues to complete crossword puzzles and she notes that her handwriting is stable.  She denies any falls or issues with balance. She continues to walk at her group home. She notes a cough that is productive of clear sputum intermittently. She denies any fevers or shaking chills.   Past Medical History  Diagnosis Date  . Hypertension   . Diabetes mellitus     years  . Asthma   . Depression   . Mental retardation     stopped school at 9th grade   . History of recurrent UTIs   . Shortness of breath     with exertion  . Cancer (HCC)     Rectal   . Arthritis   . Gout   . Seizures ([place])     more than 4 yrs since last seizure. UNknown etiology  . Rectal cancer ([place])   . Iron deficiency 04/27/2014  . Chronic renal disease, stage 3, moderately decreased glomerular filtration rate between 30-59 mL/min/1.73 square meter 04/27/2014  . Numbness and tingling in hands     x several months   . GERD (gastroesophageal reflux disease)   . Vitamin B 12 deficiency 02/01/2015    Incidentally found without antibody testing.      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     has No Known Allergies.  Current Outpatient Prescriptions on File Prior to Visit  Medication Sig Dispense Refill   . amLODipine (NORVASC) 10 MG tablet Take 1 tablet (10 mg total) by mouth every morning. 30 tablet 1  . aspirin EC 81 MG tablet Take 81 mg by mouth daily.    .Marland Kitchenazithromycin (ZITHROMAX) 250 MG tablet Take two tablets on day one, then one tablet daily until completed. 6 each 0  . calcium carbonate (TUMS) 500 MG chewable tablet Chew 2 tablets (400 mg of elemental calcium total) by mouth 3 (three) times daily. 180 tablet 1  . calcium-vitamin D (OSCAL WITH D) 500-200 MG-UNIT tablet Take 2 tablets by mouth daily with breakfast. 60 tablet 5  . clotrimazole-betamethasone (LOTRISONE) cream Apply cream twice a day to breast areas. 45 g 1  . dextrose 5 % SOLN 1,000 mL with fluorouracil 5 GM/100ML SOLN Inject into the vein. Every 14 days over 46 hours    . diphenhydrAMINE (BENADRYL) 25 MG tablet Take 1 tablet (25 mg total) by mouth every 6 (six) hours. 14 tablet 0  . docusate sodium (COLACE) 100 MG capsule Take 100 mg by mouth 2 (two) times daily.    . Emollient (CERAVE) LOTN Apply 1 application topically daily. Apply to both feet    . famotidine (PEPCID) 20 MG tablet Take 1 tablet (20 mg total) by mouth 2 (two) times daily. (Patient not taking: Reported on 04/26/2015) 14 tablet 0  . ferrous sulfate  325 (65 FE) MG tablet TAKE (1) TABLET BY MOUTH TWICE DAILY. 60 tablet 4  . fish oil-omega-3 fatty acids 1000 MG capsule Take 1 g by mouth 2 (two) times daily.    . fluconazole (DIFLUCAN) 100 MG tablet Take 1 tablet (100 mg total) by mouth daily. 10 tablet 0  . Fluticasone-Salmeterol (ADVAIR) 250-50 MCG/DOSE AEPB Inhale 1 puff into the lungs every 12 (twelve) hours.    [name] glucose blood (ACCU-CHEK AVIVA PLUS) test strip 1 each by Other route daily. Use as instructed    . HYDROcodone-acetaminophen (NORCO/VICODIN) 5-325 MG tablet Take 1 tablet by mouth every 4 (four) hours as needed for moderate pain. For pain not relieved by tramadol 40 tablet 0  . ibuprofen (ADVIL,MOTRIN) 600 MG tablet Take 600 mg by mouth every 6 (six)  hours as needed for moderate pain.    . Insulin Glargine (LANTUS SOLOSTAR) 100 UNIT/ML SOPN Inject 12 Units into the skin at bedtime.     [name] ipratropium-albuterol (DUONEB) 0.5-2.5 (3) MG/3ML SOLN     . leucovorin 50 MG injection Inject into the vein. Every 14 days    . lidocaine (XYLOCAINE) 2 % solution TAKE 5 ML BY MOUTH FOUR TIMES DAILY. 360 mL 0  . lidocaine-prilocaine (EMLA) cream Apply a quarter size amount to port site 1 hour prior to chemo. Do not rub in. Cover with plastic wrap. 30 g 3  . lisinopril (PRINIVIL,ZESTRIL) 10 MG tablet Take 1 tablet (10 mg total) by mouth daily. (Patient not taking: Reported on 06/07/2015) 30 tablet 1  . loperamide (IMODIUM) 2 MG capsule Take 2 caps after 1st loose stool and then 1 cap q2h until 12 hours have passed without having a loose stool. At bedtime, take 2 caps. Then 2 caps q4 h until morning. 45 capsule 0  . loratadine-pseudoephedrine (LORATADINE-D 24HR) 10-240 MG per 24 hr tablet Take 1 tablet by mouth daily.    . magic mouthwash w/lidocaine SOLN Take 5 mLs by mouth 4 (four) times daily. 360 mL 1  . metFORMIN (GLUCOPHAGE) 1000 MG tablet Take 1,000 mg by mouth 2 (two) times daily with a meal.    . metoprolol succinate (TOPROL-XL) 50 MG 24 hr tablet Take 100 mg by mouth at bedtime.     . montelukast (SINGULAIR) 10 MG tablet Take 10 mg by mouth at bedtime.    [name] NOVOFINE AUTOCOVER 30G X 8 MM MISC     . ondansetron (ZOFRAN) 8 MG tablet Take 1 tablet (8 mg total) by mouth every 8 (eight) hours as needed for nausea or vomiting. #1 nausea med to take 30 tablet 2  . OXALIPLATIN IV Inject into the vein every 14 (fourteen) days. Starting 10/19    . pantoprazole (PROTONIX) 40 MG tablet Take 40 mg by mouth every morning.     . Pegfilgrastim (NEULASTA ONPRO [place]) Inject into the skin. Every 14 days    . phenytoin (DILANTIN) 100 MG ER capsule Take 100 mg by mouth 2 (two) times daily.    . polyethylene glycol (MIRALAX) packet Take 17 g by mouth daily. 14 each 3  .  pravastatin (PRAVACHOL) 40 MG tablet Take 40 mg by mouth every evening.     [name] PROAIR HFA 108 (90 BASE) MCG/ACT inhaler     . prochlorperazine (COMPAZINE) 10 MG tablet Take 1 tablet (10 mg total) by mouth every 6 (six) hours as needed for nausea or vomiting. #2 nausea med to take 30 tablet 2  . senna-docusate (SENOKOT-S) 8.6-50 MG tablet  Take 1 tablet by mouth 2 (two) times daily as needed for mild constipation. 60 tablet 3  . silver sulfADIAZINE (SILVADENE) 1 % cream Apply 1 application topically every morning.     . sodium chloride (OCEAN) 0.65 % SOLN nasal spray Place 2 sprays into both nostrils 4 (four) times daily as needed for congestion.    . sodium polystyrene (KAYEXALATE) powder Take by mouth once. 30 g 0  . traMADol (ULTRAM) 50 MG tablet Take 1 tablet (50 mg total) by mouth every 4 (four) hours as needed for moderate pain. 30 tablet 2  . traZODone (DESYREL) 50 MG tablet     . triamcinolone cream (KENALOG) 0.1 % Apply 1 application topically 2 (two) times daily. To stoma     Current Facility-Administered Medications on File Prior to Visit  Medication Dose Route Frequency Provider Last Rate Last Dose  . fluorouracil (ADRUCIL) 4,450 mg in sodium chloride 0.9 % 61 mL chemo infusion  2,040 mg/m2 (Treatment Plan Actual) Intravenous 1 day or 1 dose Patrici Ranks, MD      . leucovorin 868 mg in dextrose 5 % 250 mL infusion  400 mg/m2 (Treatment Plan Actual) Intravenous Once Patrici Ranks, MD 147 mL/hr at 06/07/15 1249 868 mg at 06/07/15 1249  . oxaliplatin (ELOXATIN) 140 mg in dextrose 5 % 500 mL chemo infusion  65.45 mg/m2 (Treatment Plan Actual) Intravenous Once Patrici Ranks, MD 264 mL/hr at 06/07/15 1248 140 mg at 06/07/15 1248  . sodium chloride 0.9 % injection 10 mL  10 mL Intracatheter PRN Patrici Ranks, MD   10 mL at 05/12/15 1442  . sodium chloride 0.9 % injection 10 mL  10 mL Intracatheter PRN Patrici Ranks, MD   10 mL at 06/07/15 1150    Past Surgical History    Procedure Laterality Date  . Abdominal hysterectomy    . Multiple extractions with alveoloplasty N/A 11/23/2012    Procedure: MULTIPLE EXTRACION 1, 2, 4, 5, 6, 7, 8, 9, 10, 11, 12, 13, 14, 17, 18, 20, 23, 24, 25, 26, 28, 29, 32 WITH ALVEOLOPLASTY, REMOVE BILATERAL TORI;  Surgeon: Gae Bon, DDS;  Location: [facility];  Service: Oral Surgery;  Laterality: N/A;  . Colonoscopy with esophagogastroduodenoscopy (egd) N/A 01/14/2013    Procedure: COLONOSCOPY WITH ESOPHAGOGASTRODUODENOSCOPY (EGD);  Surgeon: Rogene Houston, MD;  [facility];  Service: Endoscopy;  Laterality: N/A;  250-moved to 315 Ann to notify pt  . Colonoscopy N/A 02/04/2013    Procedure: COLONOSCOPY;  Surgeon: Rogene Houston, MD;  [facility];  Service: Endoscopy;  Laterality: N/A;  225  . Eus N/A 02/18/2013    Procedure: LOWER ENDOSCOPIC ULTRASOUND (EUS);  Surgeon: Milus Banister, MD;  Location: [facility];  Service: Endoscopy;  Laterality: N/A;  . Flexible sigmoidoscopy N/A 07/26/2013    Procedure: FLEXIBLE SIGMOIDOSCOPY;  Surgeon: Jamesetta So, MD;  [facility];  Service: General;  Laterality: N/A;  . Abdominal perineal bowel resection N/A 07/26/2013    Procedure:  ABDOMINAL PERINEAL RESECTION;  Surgeon: Jamesetta So, MD;  [facility];  Service: General;  Laterality: N/A;  . Supracervical abdominal hysterectomy N/A 07/26/2013    Procedure: HYSTERECTOMY SUPRACERVICAL ABDOMINAL ;  Surgeon: Jamesetta So, MD;  [facility];  Service: General;  Laterality: N/A;  . Salpingoophorectomy Bilateral 07/26/2013    Procedure: SALPINGO OOPHORECTOMY;  Surgeon: Jamesetta So, MD;  [facility];  Service: General;  Laterality: Bilateral;  .  Colostomy Left 07/26/2013    Procedure: COLOSTOMY;  Surgeon: Jamesetta So, MD;  [facility];  Service: General;  Laterality: Left;  . Colonoscopy N/A 05/12/2014    Procedure: COLONOSCOPY;  Surgeon: Rogene Houston, MD;  [facility];  Service:  Endoscopy;  Laterality: N/A;  1030  . Colon surgery  2015    bowel resection/w colostomy  . Video assisted thoracoscopy Left 10/26/2014    Procedure: LEFT VIDEO ASSISTED THORACOSCOPY;  Surgeon: [name], MD;  Location: [place];  Service: Thoracic;  Laterality: Left;  . Segmentecomy Left 10/26/2014    Procedure: LEFT LOWER LOBE SUPERIOR SEGMENTECTOMY;  Surgeon: [name], MD;  Location: [place];  Service: Thoracic;  Laterality: Left;  . Lymph node dissection Left 10/26/2014    Procedure: LYMPH NODE DISSECTION;  Surgeon: [name], MD;  Location: [place];  Service: Thoracic;  Laterality: Left;  . Portacath placement Left 01/02/2015    Procedure: INSERTION PORT-A-CATH;  Surgeon: Aviva Signs, MD;  [facility];  Service: General;  Laterality: Left;    Denies any headaches, dizziness, double vision, fevers, chills, night sweats, vomiting, diarrhea, constipation, chest pain, heart palpitations, shortness of breath, blood in stool, black tarry stool, urinary pain, urinary burning, urinary frequency, hematuria.   PHYSICAL EXAMINATION  ECOG PERFORMANCE STATUS: 2 - Symptomatic, <50% confined to bed  There were no vitals filed for this visit.  Blood pressure 177/69 Pulse 81 and respirations 20 Temperature 98.9F Oxygen saturation 100% on room air.  GENERAL:alert, no distress, well nourished, comfortable, cooperative, obese, smiling and unaccompanied today and in a chemo-recliner, working on crossword puzzles.[name] SKIN: skin color, texture, turgor are normal.Thickened skin of the palms bilaterally HEAD: Normocephalic, No masses, lesions, tenderness or abnormalities EYES: normal, PERRLA, EOMI, Conjunctiva are pink and non-injected EARS: External ears normal OROPHARYNX:lips, buccal mucosa, and tongue normal and mucous membranes are moist  NECK: supple, no adenopathy, trachea midline LYMPH:  no palpable lymphadenopathy BREAST:not examined LUNGS: clear to auscultation  without any wheezes, rales, or rhonchi. HEART: regular rate & rhythm without any murmurs, rubs, gallop. S1 and S2 are normal. ABDOMEN:abdomen soft, non-tender, obese and normal bowel sounds BACK: Back symmetric, no curvature., No CVA tenderness EXTREMITIES:less then 2 second capillary refill, no joint deformities, effusion, or inflammation, no skin discoloration, no cyanosis, darkening of fingernails at the base, B/L LE trace edema with compression stockings in place, equal in size. No erythema, heat, tenderness to palpation, and negative Homans sign bilaterally.  NEURO: no focal motor/sensory deficits, gait normal   LABORATORY DATA: CBC    Component Value Date/Time   WBC 3.1* 06/07/2015 1004   RBC 3.11* 06/07/2015 1004   RBC 3.84* 11/07/2009 1649   HGB 9.4* 06/07/2015 1004   HCT 29.3* 06/07/2015 1004   PLT 138* 06/07/2015 1004   MCV 94.2 06/07/2015 1004   MCH 30.2 06/07/2015 1004   MCHC 32.1 06/07/2015 1004   RDW 16.7* 06/07/2015 1004   LYMPHSABS 0.4* 06/07/2015 1004   MONOABS 0.6 06/07/2015 1004   EOSABS 0.3 06/07/2015 1004   BASOSABS 0.0 06/07/2015 1004      Chemistry      Component Value Date/Time   NA 138 06/07/2015 1004   K 4.8 06/07/2015 1004   CL 105 06/07/2015 1004   CO2 26 06/07/2015 1004   BUN 33* 06/07/2015 1004   CREATININE 1.22* 06/07/2015 1004   CREATININE 1.11* 02/08/2013 1451      Component Value Date/Time   CALCIUM 8.5* 06/07/2015  1004   ALKPHOS 133* 06/07/2015 1004   AST 22 06/07/2015 1004   ALT 20 06/07/2015 1004   BILITOT 0.3 06/07/2015 1004      Lab Results  Component Value Date   CEA 4.5 01/04/2015   Lab Results  Component Value Date   FERRITIN 247 05/10/2015     PENDING LABS:   RADIOGRAPHIC STUDIES:  No results found.   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 Roxan Hockey on  10/26/2014 demonstrating recurrent Stage IV disease after undergoing Xeloda+XRT finishing on 05/04/2013, followed by APR on 07/26/2013 by Dr. Arnoldo Morale with marked cytoreduction documented, followed by 6 months worth of Xeloda 1000 mg BID 2 weeks on and 1 week off x 6 months.  Surveillance per NCCN guidelines was followed with serial CT imaging of chest demonstrating an enlarging pulmonary nodule as mentioned previously.  Now on systemic chemotherapy consisting of FOLFOX + Avastin beginning on 01/04/2015.  Oncology history is up to date.  Labs meet treatment parameters today and therefore, she will receive her 11th cycles of treatment.  Her WBC and ANC are noted and Neulasta OnPro is built into her treatment plan for day #3.  Aranesp today as planned at renal dosing.   B12 given on 05/26/2015 and she will be due for her injection on ~ 06/23/2015.  I will ask nursing to ascertain a magnesium level today.  If low, will ordered IV mag sulfate today.  Labs in 2 weeks prior to treatment #12: CBC diff, CMET, UA, CEA, ferritin, magnesium.  Return in 2 weeks for follow-up and next treatment.  Addendum: Hypomagnesemia noted.  2 g of IV magnesium ordered and given today.   THERAPY PLAN:  Continue with adjuvant FOLFOX + Avastin.    All questions were answered. The patient knows to call the clinic with any problems, questions or concerns. We can certainly see the patient much sooner if necessary.  Patient and plan discussed with Dr. Ancil Linsey and she is in agreement with the aforementioned.   This note is electronically signed by: Doy Mince 06/07/2015 1:33 PM

## 2015-06-06 NOTE — Assessment & Plan Note (Signed)
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 Roxan Hockey on 10/26/2014 demonstrating recurrent Stage IV disease after undergoing Xeloda+XRT finishing on 05/04/2013, followed by APR on 07/26/2013 by Dr. Arnoldo Morale with marked cytoreduction documented, followed by 6 months worth of Xeloda 1000 mg BID 2 weeks on and 1 week off x 6 months.  Surveillance per NCCN guidelines was followed with serial CT imaging of chest demonstrating an enlarging pulmonary nodule as mentioned previously.  Now on systemic chemotherapy consisting of FOLFOX + Avastin beginning on 01/04/2015.  Oncology history is updated.  Aranesp today as planned at renal dosing.   B12 given on 05/26/2015 and she will be due for her injection on ~ 06/23/2015  Return in 2 weeks for follow-up and next treatment.

## 2015-06-07 ENCOUNTER — Encounter (HOSPITAL_BASED_OUTPATIENT_CLINIC_OR_DEPARTMENT_OTHER): Payer: Medicare Other

## 2015-06-07 ENCOUNTER — Encounter (HOSPITAL_COMMUNITY): Payer: Self-pay | Admitting: Oncology

## 2015-06-07 ENCOUNTER — Ambulatory Visit (HOSPITAL_COMMUNITY): Payer: Medicare Other | Admitting: Oncology

## 2015-06-07 ENCOUNTER — Encounter (HOSPITAL_COMMUNITY): Payer: Medicare Other

## 2015-06-07 ENCOUNTER — Encounter (HOSPITAL_BASED_OUTPATIENT_CLINIC_OR_DEPARTMENT_OTHER): Payer: Medicare Other | Admitting: Oncology

## 2015-06-07 VITALS — BP 171/74 | HR 87 | Temp 97.7°F | Resp 20 | Wt 224.4 lb

## 2015-06-07 DIAGNOSIS — C7800 Secondary malignant neoplasm of unspecified lung: Secondary | ICD-10-CM | POA: Diagnosis not present

## 2015-06-07 DIAGNOSIS — C20 Malignant neoplasm of rectum: Secondary | ICD-10-CM | POA: Diagnosis not present

## 2015-06-07 DIAGNOSIS — D631 Anemia in chronic kidney disease: Secondary | ICD-10-CM | POA: Diagnosis not present

## 2015-06-07 DIAGNOSIS — Z5112 Encounter for antineoplastic immunotherapy: Secondary | ICD-10-CM | POA: Diagnosis not present

## 2015-06-07 DIAGNOSIS — Z5111 Encounter for antineoplastic chemotherapy: Secondary | ICD-10-CM | POA: Diagnosis not present

## 2015-06-07 DIAGNOSIS — N189 Chronic kidney disease, unspecified: Secondary | ICD-10-CM

## 2015-06-07 DIAGNOSIS — R911 Solitary pulmonary nodule: Secondary | ICD-10-CM

## 2015-06-07 DIAGNOSIS — E538 Deficiency of other specified B group vitamins: Secondary | ICD-10-CM

## 2015-06-07 LAB — MAGNESIUM: MAGNESIUM: 1.3 mg/dL — AB (ref 1.7–2.4)

## 2015-06-07 LAB — COMPREHENSIVE METABOLIC PANEL
ALBUMIN: 3.5 g/dL (ref 3.5–5.0)
ALK PHOS: 133 U/L — AB (ref 38–126)
ALT: 20 U/L (ref 14–54)
ANION GAP: 7 (ref 5–15)
AST: 22 U/L (ref 15–41)
BILIRUBIN TOTAL: 0.3 mg/dL (ref 0.3–1.2)
BUN: 33 mg/dL — ABNORMAL HIGH (ref 6–20)
CALCIUM: 8.5 mg/dL — AB (ref 8.9–10.3)
CO2: 26 mmol/L (ref 22–32)
Chloride: 105 mmol/L (ref 101–111)
Creatinine, Ser: 1.22 mg/dL — ABNORMAL HIGH (ref 0.44–1.00)
GFR calc Af Amer: 53 mL/min — ABNORMAL LOW (ref >60)
GFR, EST NON AFRICAN AMERICAN: 46 mL/min — AB (ref >60)
GLUCOSE: 135 mg/dL — AB (ref 65–99)
POTASSIUM: 4.8 mmol/L (ref 3.5–5.1)
Sodium: 138 mmol/L (ref 135–145)
TOTAL PROTEIN: 6.7 g/dL (ref 6.5–8.1)

## 2015-06-07 LAB — CBC WITH DIFFERENTIAL/PLATELET
Basophils Absolute: 0 10*3/uL (ref 0.0–0.1)
Basophils Relative: 1 %
Eosinophils Absolute: 0.3 10*3/uL (ref 0.0–0.7)
Eosinophils Relative: 9 %
HEMATOCRIT: 29.3 % — AB (ref 36.0–46.0)
HEMOGLOBIN: 9.4 g/dL — AB (ref 12.0–15.0)
LYMPHS ABS: 0.4 10*3/uL — AB (ref 0.7–4.0)
Lymphocytes Relative: 13 %
MCH: 30.2 pg (ref 26.0–34.0)
MCHC: 32.1 g/dL (ref 30.0–36.0)
MCV: 94.2 fL (ref 78.0–100.0)
MONOS PCT: 19 %
Monocytes Absolute: 0.6 10*3/uL (ref 0.1–1.0)
NEUTROS ABS: 1.8 10*3/uL (ref 1.7–7.7)
NEUTROS PCT: 60 %
Platelets: 138 10*3/uL — ABNORMAL LOW (ref 150–400)
RBC: 3.11 MIL/uL — ABNORMAL LOW (ref 3.87–5.11)
RDW: 16.7 % — ABNORMAL HIGH (ref 11.5–15.5)
WBC: 3.1 10*3/uL — ABNORMAL LOW (ref 4.0–10.5)

## 2015-06-07 LAB — URINALYSIS, DIPSTICK ONLY
BILIRUBIN URINE: NEGATIVE
Glucose, UA: NEGATIVE mg/dL
HGB URINE DIPSTICK: NEGATIVE
KETONES UR: NEGATIVE mg/dL
Leukocytes, UA: NEGATIVE
NITRITE: NEGATIVE
PROTEIN: NEGATIVE mg/dL
SPECIFIC GRAVITY, URINE: 1.01 (ref 1.005–1.030)
pH: 5 (ref 5.0–8.0)

## 2015-06-07 MED ORDER — SODIUM CHLORIDE 0.9 % IV SOLN
Freq: Once | INTRAVENOUS | Status: AC
  Administered 2015-06-07: 12:00:00 via INTRAVENOUS
  Filled 2015-06-07: qty 4

## 2015-06-07 MED ORDER — DEXTROSE 5 % IV SOLN
Freq: Once | INTRAVENOUS | Status: AC
  Administered 2015-06-07: 13:00:00 via INTRAVENOUS

## 2015-06-07 MED ORDER — SODIUM CHLORIDE 0.9 % IJ SOLN
10.0000 mL | INTRAMUSCULAR | Status: DC | PRN
  Administered 2015-06-07: 10 mL
  Filled 2015-06-07: qty 10

## 2015-06-07 MED ORDER — DEXTROSE 5 % IV SOLN
65.4500 mg/m2 | Freq: Once | INTRAVENOUS | Status: AC
  Administered 2015-06-07: 140 mg via INTRAVENOUS
  Filled 2015-06-07: qty 28

## 2015-06-07 MED ORDER — SODIUM CHLORIDE 0.9 % IV SOLN
5.0000 mg/kg | Freq: Once | INTRAVENOUS | Status: AC
  Administered 2015-06-07: 525 mg via INTRAVENOUS
  Filled 2015-06-07: qty 16

## 2015-06-07 MED ORDER — SODIUM CHLORIDE 0.9 % IV SOLN
Freq: Once | INTRAVENOUS | Status: AC
  Administered 2015-06-07: 12:00:00 via INTRAVENOUS

## 2015-06-07 MED ORDER — MAGNESIUM SULFATE 2 GM/50ML IV SOLN
2.0000 g | Freq: Once | INTRAVENOUS | Status: AC
  Administered 2015-06-07: 2 g via INTRAVENOUS
  Filled 2015-06-07: qty 50

## 2015-06-07 MED ORDER — DARBEPOETIN ALFA 100 MCG/0.5ML IJ SOSY
100.0000 ug | PREFILLED_SYRINGE | Freq: Once | INTRAMUSCULAR | Status: AC
  Administered 2015-06-07: 100 ug via SUBCUTANEOUS
  Filled 2015-06-07: qty 0.5

## 2015-06-07 MED ORDER — SODIUM CHLORIDE 0.9 % IV SOLN
2040.0000 mg/m2 | INTRAVENOUS | Status: DC
  Administered 2015-06-07: 4450 mg via INTRAVENOUS
  Filled 2015-06-07: qty 89

## 2015-06-07 MED ORDER — LEUCOVORIN CALCIUM INJECTION 350 MG
400.0000 mg/m2 | Freq: Once | INTRAVENOUS | Status: AC
  Administered 2015-06-07: 868 mg via INTRAVENOUS
  Filled 2015-06-07: qty 43.4

## 2015-06-07 NOTE — Patient Instructions (Signed)
.  [place] at [facility] Discharge Instructions  RECOMMENDATIONS MADE BY THE CONSULTANT AND ANY TEST RESULTS WILL BE SENT TO YOUR REFERRING PHYSICIAN.  Exam today per T. Kefalas PA-C Aranesp today Treatment today and return in 2 weeks- B12 due at that time  Thank you for choosing [place] at [facility] to provide your oncology and hematology care.  To afford each patient quality time with our provider, please arrive at least 15 minutes before your scheduled appointment time.   Beginning January 23rd 2017 lab work for the [facility] will be done in the  Main lab at [facility] on 1st floor. If you have a lab appointment with the [facility] please come in thru the  Main Entrance and check in at the main information desk  You need to re-schedule your appointment should you arrive 10 or more minutes late.  We strive to give you quality time with our providers, and arriving late affects you and other patients whose appointments are after yours.  Also, if you no show three or more times for appointments you may be dismissed from the clinic at the providers discretion.     Again, thank you for choosing [facility].  Our hope is that these requests will decrease the amount of time that you wait before being seen by our physicians.       _____________________________________________________________  Should you have questions after your visit to [facility], please contact our office at [phone] between the hours of 8:30 a.m. and 4:30 p.m.  Voicemails left after 4:30 p.m. will not be returned until the following business day.  For prescription refill requests, have your pharmacy contact our office.         Resources For Cancer Patients and their Caregivers ? American Cancer Society: Can assist with transportation, wigs, general needs, runs Look Good Feel Better.        425-347-9596 ? Cancer Care: Provides  financial assistance, online support groups, medication/co-pay assistance.  1-800-813-HOPE 682 166 4415) ? [place] Assists [place] cancer patients and their families through emotional , educational and financial support.  (684)215-9468 ? [place] DSS Where to apply for food stamps, Medicaid and utility assistance. 724-015-1680 ? [facility]: Transportation to medical appointments. 205-464-2585 ? Social Security Administration: May apply for disability if have a Stage IV cancer. 980-741-6449 780-328-3071 ? [employer], Disability and Transit Services: Assists with nutrition, care and transit needs. 236-484-3319

## 2015-06-07 NOTE — Patient Instructions (Addendum)
[facility] Discharge Instructions for Patients Receiving Chemotherapy   Beginning January 23rd 2017 lab work for the [facility] will be done in the  Main lab at [facility] on 1st floor. If you have a lab appointment with the [place] please come in thru the  Main Entrance and check in at the main information desk   Today you received the following chemotherapy agents Avastin, Oxaliplatin, leucovorin and 17f pump. You also received a magnesium infusion. Hemoglobin 9.4 today, Aranesp 100 mcg injection given as ordered.  To help prevent nausea and vomiting after your treatment, we encourage you to take your nausea medication as instructed. If you develop nausea and vomiting, or diarrhea that is not controlled by your medication, call the clinic.  The clinic phone number is [phone] Office hours are Monday-Friday 8:30am-5:00pm.  BELOW ARE SYMPTOMS THAT SHOULD BE REPORTED IMMEDIATELY:  *FEVER GREATER THAN 101.0 F  *CHILLS WITH OR WITHOUT FEVER  NAUSEA AND VOMITING THAT IS NOT CONTROLLED WITH YOUR NAUSEA MEDICATION  *UNUSUAL SHORTNESS OF BREATH  *UNUSUAL BRUISING OR BLEEDING  TENDERNESS IN MOUTH AND THROAT WITH OR WITHOUT PRESENCE OF ULCERS  *URINARY PROBLEMS  *BOWEL PROBLEMS  UNUSUAL RASH Items with * indicate a potential emergency and should be followed up as soon as possible. If you have an emergency after office hours please contact your primary care physician or go to the nearest emergency department.  Please call the clinic during office hours if you have any questions or concerns.   You may also contact the Patient Navigator at [phone] you have any questions or need assistance in obtaining follow up care.  Resources For Cancer Patients and their Caregivers ? American Cancer Society: Can assist with transportation, wigs, general needs, runs Look Good Feel Better.        1(872) 232-4233? Cancer Care: Provides financial  assistance, online support groups, medication/co-pay assistance.  1-800-813-HOPE (419-399-5286 ? [place] cancer patients and their families through emotional , educational and financial support.  3(276) 490-8378? [place] DSS Where to apply for food stamps, Medicaid and utility assistance. 33122517177? RCATS: Transportation to medical appointments. 3219-490-8573? Social Security Administration: May apply for disability if have a Stage IV cancer. [phone]? [employer] Disability and Transit Services: Assists with nutrition, care and transit needs. [phone]

## 2015-06-07 NOTE — Progress Notes (Signed)
Caregiver reports patient's blood pressure has been running high over the past week. Reports patient saw Dr.Hawkins last week for facial and lip swelling. Reports Lisinopril discontinued by Dr.Hawkins thining it may be the cause of the facial/lip swelling. Caregiver reports that they were instructed by Dr.Hawkins just to monitor patient's blood pressure.  [name] presents today for injection per MD orders. Aranesp 100 mcg administered SQ in right Abdomen. Administration without incident. Patient tolerated well.  Tolerated chemo well. Continuous infusion pump intact. Stable on discharge home with caregiver via wheelchair.

## 2015-06-07 NOTE — Addendum Note (Signed)
Addended by: Baird Cancer on: 06/07/2015 01:33 PM   Modules accepted: Orders

## 2015-06-09 ENCOUNTER — Ambulatory Visit (HOSPITAL_COMMUNITY): Payer: Medicare Other

## 2015-06-09 ENCOUNTER — Encounter (HOSPITAL_BASED_OUTPATIENT_CLINIC_OR_DEPARTMENT_OTHER): Payer: Medicare Other

## 2015-06-09 VITALS — BP 174/70 | HR 83 | Temp 98.3°F | Resp 20

## 2015-06-09 DIAGNOSIS — Z5189 Encounter for other specified aftercare: Secondary | ICD-10-CM | POA: Diagnosis not present

## 2015-06-09 DIAGNOSIS — C7800 Secondary malignant neoplasm of unspecified lung: Secondary | ICD-10-CM | POA: Diagnosis not present

## 2015-06-09 DIAGNOSIS — C20 Malignant neoplasm of rectum: Secondary | ICD-10-CM | POA: Diagnosis not present

## 2015-06-09 DIAGNOSIS — R911 Solitary pulmonary nodule: Secondary | ICD-10-CM

## 2015-06-09 MED ORDER — PEGFILGRASTIM 6 MG/0.6ML ~~LOC~~ PSKT
6.0000 mg | PREFILLED_SYRINGE | Freq: Once | SUBCUTANEOUS | Status: AC
  Administered 2015-06-09: 6 mg via SUBCUTANEOUS

## 2015-06-09 MED ORDER — PEGFILGRASTIM 6 MG/0.6ML ~~LOC~~ PSKT
PREFILLED_SYRINGE | SUBCUTANEOUS | Status: AC
  Filled 2015-06-09: qty 0.6

## 2015-06-09 MED ORDER — HEPARIN SOD (PORK) LOCK FLUSH 100 UNIT/ML IV SOLN
INTRAVENOUS | Status: AC
  Filled 2015-06-09: qty 5

## 2015-06-09 MED ORDER — SODIUM CHLORIDE 0.9 % IJ SOLN
10.0000 mL | INTRAMUSCULAR | Status: DC | PRN
  Administered 2015-06-09: 10 mL
  Filled 2015-06-09: qty 10

## 2015-06-09 MED ORDER — HEPARIN SOD (PORK) LOCK FLUSH 100 UNIT/ML IV SOLN
500.0000 [IU] | Freq: Once | INTRAVENOUS | Status: AC | PRN
  Administered 2015-06-09: 500 [IU]

## 2015-06-09 NOTE — Progress Notes (Signed)
.  [name] returns today for port de access and flush after 46 hr continous infusion of 30f. Tolerated infusion without problems. Portacath located lt chest wall was  deaccessed and flushed with 273mNS and 500U/62m93meparin and needle removed intact. ..[name] body injector. See MAR for administration details. Injector in place and engaged with green light indicator on flashing. Tolerated application with out problems. Procedure without incident. Patient tolerated procedure well.

## 2015-06-21 ENCOUNTER — Encounter (HOSPITAL_COMMUNITY): Payer: Medicare Other

## 2015-06-21 ENCOUNTER — Encounter (HOSPITAL_BASED_OUTPATIENT_CLINIC_OR_DEPARTMENT_OTHER): Payer: Medicare Other

## 2015-06-21 ENCOUNTER — Encounter (HOSPITAL_COMMUNITY): Payer: Self-pay | Admitting: Hematology & Oncology

## 2015-06-21 ENCOUNTER — Encounter (HOSPITAL_COMMUNITY): Payer: Medicare Other | Attending: Hematology and Oncology | Admitting: Hematology & Oncology

## 2015-06-21 VITALS — BP 185/76 | HR 83 | Temp 97.8°F | Resp 16 | Wt 222.0 lb

## 2015-06-21 DIAGNOSIS — Z95828 Presence of other vascular implants and grafts: Secondary | ICD-10-CM

## 2015-06-21 DIAGNOSIS — C20 Malignant neoplasm of rectum: Secondary | ICD-10-CM | POA: Insufficient documentation

## 2015-06-21 DIAGNOSIS — D649 Anemia, unspecified: Secondary | ICD-10-CM | POA: Insufficient documentation

## 2015-06-21 DIAGNOSIS — C7800 Secondary malignant neoplasm of unspecified lung: Secondary | ICD-10-CM | POA: Diagnosis not present

## 2015-06-21 DIAGNOSIS — E538 Deficiency of other specified B group vitamins: Secondary | ICD-10-CM

## 2015-06-21 LAB — CBC WITH DIFFERENTIAL/PLATELET
BASOS ABS: 0 10*3/uL (ref 0.0–0.1)
BASOS PCT: 0 %
Eosinophils Absolute: 0.2 10*3/uL (ref 0.0–0.7)
Eosinophils Relative: 2 %
HEMATOCRIT: 30.3 % — AB (ref 36.0–46.0)
Hemoglobin: 9.5 g/dL — ABNORMAL LOW (ref 12.0–15.0)
Lymphocytes Relative: 7 %
Lymphs Abs: 0.7 10*3/uL (ref 0.7–4.0)
MCH: 29.6 pg (ref 26.0–34.0)
MCHC: 31.4 g/dL (ref 30.0–36.0)
MCV: 94.4 fL (ref 78.0–100.0)
MONO ABS: 0.5 10*3/uL (ref 0.1–1.0)
Monocytes Relative: 5 %
NEUTROS ABS: 8.4 10*3/uL — AB (ref 1.7–7.7)
NEUTROS PCT: 86 %
PLATELETS: 90 10*3/uL — AB (ref 150–400)
RBC: 3.21 MIL/uL — AB (ref 3.87–5.11)
RDW: 16.5 % — AB (ref 11.5–15.5)
WBC: 9.8 10*3/uL (ref 4.0–10.5)

## 2015-06-21 LAB — MAGNESIUM: Magnesium: 1 mg/dL — ABNORMAL LOW (ref 1.7–2.4)

## 2015-06-21 LAB — COMPREHENSIVE METABOLIC PANEL
ALBUMIN: 3.6 g/dL (ref 3.5–5.0)
ALT: 21 U/L (ref 14–54)
AST: 25 U/L (ref 15–41)
Alkaline Phosphatase: 175 U/L — ABNORMAL HIGH (ref 38–126)
Anion gap: 10 (ref 5–15)
BILIRUBIN TOTAL: 0.2 mg/dL — AB (ref 0.3–1.2)
BUN: 31 mg/dL — AB (ref 6–20)
CHLORIDE: 102 mmol/L (ref 101–111)
CO2: 25 mmol/L (ref 22–32)
Calcium: 8.4 mg/dL — ABNORMAL LOW (ref 8.9–10.3)
Creatinine, Ser: 1.28 mg/dL — ABNORMAL HIGH (ref 0.44–1.00)
GFR calc Af Amer: 50 mL/min — ABNORMAL LOW (ref >60)
GFR calc non Af Amer: 44 mL/min — ABNORMAL LOW (ref >60)
GLUCOSE: 150 mg/dL — AB (ref 65–99)
POTASSIUM: 4.2 mmol/L (ref 3.5–5.1)
Sodium: 137 mmol/L (ref 135–145)
Total Protein: 6.7 g/dL (ref 6.5–8.1)

## 2015-06-21 LAB — FERRITIN: Ferritin: 239 ng/mL (ref 11–307)

## 2015-06-21 MED ORDER — HEPARIN SOD (PORK) LOCK FLUSH 100 UNIT/ML IV SOLN
INTRAVENOUS | Status: AC
  Filled 2015-06-21: qty 5

## 2015-06-21 MED ORDER — MAGNESIUM SULFATE 2 GM/50ML IV SOLN
2.0000 g | Freq: Once | INTRAVENOUS | Status: AC
  Administered 2015-06-21: 2 g via INTRAVENOUS
  Filled 2015-06-21: qty 50

## 2015-06-21 MED ORDER — MAGNESIUM OXIDE 400 (241.3 MG) MG PO TABS: 400.0000 mg | ORAL_TABLET | Freq: Every day | ORAL | Status: DC

## 2015-06-21 MED ORDER — SODIUM CHLORIDE 0.9% FLUSH
10.0000 mL | Freq: Once | INTRAVENOUS | Status: AC
  Administered 2015-06-21: 10 mL via INTRAVENOUS

## 2015-06-21 MED ORDER — DARBEPOETIN ALFA 100 MCG/0.5ML IJ SOSY
PREFILLED_SYRINGE | INTRAMUSCULAR | Status: AC
  Filled 2015-06-21: qty 0.5

## 2015-06-21 MED ORDER — SODIUM CHLORIDE 0.9 % IV SOLN
INTRAVENOUS | Status: DC
  Administered 2015-06-21: 11:00:00 via INTRAVENOUS

## 2015-06-21 MED ORDER — DARBEPOETIN ALFA 100 MCG/0.5ML IJ SOSY
100.0000 ug | PREFILLED_SYRINGE | Freq: Once | INTRAMUSCULAR | Status: AC
  Administered 2015-06-21: 100 ug via SUBCUTANEOUS

## 2015-06-21 MED ORDER — HEPARIN SOD (PORK) LOCK FLUSH 100 UNIT/ML IV SOLN
500.0000 [IU] | Freq: Once | INTRAVENOUS | Status: AC
  Administered 2015-06-21: 500 [IU] via INTRAVENOUS

## 2015-06-21 NOTE — Patient Instructions (Signed)
[place] at [facility] Discharge Instructions  RECOMMENDATIONS MADE BY THE CONSULTANT AND ANY TEST RESULTS WILL BE SENT TO YOUR REFERRING PHYSICIAN.  \  Exam and discussion by Dr Whitney Muse today Today is your last treatment  Labs next week  Magnesium prescription  Return to see the doctor in 2 weeks Please call the clinic if you have any questions or concerns   Thank you for choosing [place] at [facility] to provide your oncology and hematology care.  To afford each patient quality time with our provider, please arrive at least 15 minutes before your scheduled appointment time.   Beginning January 23rd 2017 lab work for the [facility] will be done in the  Main lab at [facility] on 1st floor. If you have a lab appointment with the [place] please come in thru the  Main Entrance and check in at the main information desk  You need to re-schedule your appointment should you arrive 10 or more minutes late.  We strive to give you quality time with our providers, and arriving late affects you and other patients whose appointments are after yours.  Also, if you no show three or more times for appointments you may be dismissed from the clinic at the providers discretion.     Again, thank you for choosing [facility].  Our hope is that these requests will decrease the amount of time that you wait before being seen by our physicians.       _____________________________________________________________  Should you have questions after your visit to [facility], please contact our office at [phone] between the hours of 8:30 a.m. and 4:30 p.m.  Voicemails left after 4:30 p.m. will not be returned until the following business day.  For prescription refill requests, have your pharmacy contact our office.         Resources For Cancer Patients and their Caregivers ? American Cancer Society: Can assist with  transportation, wigs, general needs, runs Look Good Feel Better.        (650) [phone] ? Cancer Care: Provides financial assistance, online support groups, medication/co-pay assistance.  1-800-813-HOPE 718-164-3750) ? [place] Assists [place] cancer patients and their families through emotional , educational and financial support.  5045246362 ? [place] DSS Where to apply for food stamps, Medicaid and utility assistance. (340)558-9001 ? RCATS: Transportation to medical appointments. (937)856-0352 ? Social Security Administration: May apply for disability if have a Stage IV cancer. [phone] 819-179-5788 ? [employer], Disability and Transit Services: Assists with nutrition, care and transit needs. ([phone]

## 2015-06-21 NOTE — Progress Notes (Signed)
Jennifer Bogus, MD [place] [address]  History of Locally advanced Rectal Cancer Recurrent disease with solitary pulmonary metastases, s/p resection  CURRENT THERAPY: FOLFOX/AVASTIN adjuvant  INTERVAL HISTORY: [name] 65 y.o. female returns for followup of Locally advanced adenocarcinoma of the rectum, She tolerated combined modality therapy well. Radiation therapy completed on 05/04/2013 which was also the last day she received Xeloda, excellent tolerance, status post APR on 07/26/2013 with marked cytoreduction documented as evidenced by no lymph node involvement. pT3 tumor was found. S/P 6 months of Xeloda at 1000 mg BID 2 weeks on and 1 week off finishing in December 2015.   She has undergone successful resection of a solitary pulmonary nodule, unfortunately pathology was consistent with recurrent colorectal cancer.  She lives at [facility] off of [address].   [name] is here alone. She presents for her final cycle of chemotherapy with FOLFOX/AVASTIN. She notes some tingling in her feet but states it is not bad. She says her appetite is good. She denies problems with her hands.  No nausea, vomiting. No mouth sores. No problems with her ostomy site.     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   03/25/2013 - 05/05/2013 Chemotherapy Xeloda 1500 mg BID 5 days/week with radiation therapy.   07/26/2013 Definitive Surgery Dr. Arnoldo Morale- Flexible sigmoidoscopy, abdominoperineal resection, total abdominal hysterectomy with bilateral salpingo-oophorectomy   08/30/2013 - 03/01/2014 Chemotherapy Xeloda 1000 mg BID 14 days on and 7 days off x 6 months   03/08/2014 Imaging CT CAP- Bilateral pulmonary nodules, including an 8 mm left lower lobe nodule. Metastatic disease is a concern. The  left lower lobe nodule has progressed since 03/05/2013, when it measured 5 mm.   05/26/2014 Imaging CT Chest- Continued enlargement of the superior segment left lower lobe nodule. Malignancy is likely.   In contrast, the right lower lobe nodule is probably benign.   08/17/2014 Imaging CT- Superior segment left lower lobe pulmonary nodule measures stable since the most recent comparison study, but is again noted to have increased in size when comparing to older studies. Continued close attention will be required as neoplasm remains a con   10/26/2014 Surgery thoracoscopic left lower lobe superior segmentectomy. Pathology with metastatic adenocarcinoma c/w colonic primary   01/04/2015 -  Chemotherapy FOLFOX+Avastin (Avastin started on 01/18/2015).   01/09/2015 Imaging CT CAP- Interval wedge resection of metastasis within the left lower lobe. No evidence of new metastatic disease within the chest, abdomen or pelvis.   02/15/2015 Treatment Plan Change Treatment deferred due to renal function change (patient poor historian) with N/V.   02/22/2015 Treatment Plan Change Added Aranesp at renal dosing to supportive therpay plan.   03/08/2015 Treatment Plan Change Defer treatment x 1 week   04/12/2015 Adverse Reaction Patient reported mouth sores.  None on exam.     04/12/2015 Treatment Plan Change 5FU bolus is discontinued for "mouth sores"   04/12/2015 Adverse Reaction Increasing Creatinine Cl.  CrCl calculated to be 30.7.     04/12/2015 Treatment Plan Change Oxaliplatin dose reduced by 20% to 65 mg/m2 based upon creatinine clearance.   05/24/2015 Treatment Plan Change 5FU CI decreased by 15%.       Past Medical History  Diagnosis Date  . Hypertension   . Diabetes mellitus  years  . Asthma   . Depression   . Mental retardation     stopped school at 9th grade   . History of recurrent UTIs   . Shortness of breath     with exertion  . Cancer (HCC)     Rectal   . Arthritis   . Gout   . Seizures ([place])      more than 4 yrs since last seizure. UNknown etiology  . Rectal cancer ([place])   . Iron deficiency 04/27/2014  . Chronic renal disease, stage 3, moderately decreased glomerular filtration rate between 30-59 mL/min/1.73 square meter 04/27/2014  . Numbness and tingling in hands     x several months   . GERD (gastroesophageal reflux disease)   . Vitamin B 12 deficiency 02/01/2015    Incidentally found without antibody testing.      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); Incidental pulmonary nodule, greater than or equal to 35m; Abdominal pain; Insomnia; Iron deficiency; Chronic renal disease, stage 3, moderately decreased glomerular filtration rate between 30-59 mL/min/1.73 square meter; Lung nodule; and Vitamin B 12 deficiency on her problem list.     has No Known Allergies.  Ms. TShifletdoes not currently have medications on file.  Past Surgical History  Procedure Laterality Date  . Abdominal hysterectomy    . Multiple extractions with alveoloplasty N/A 11/23/2012    Procedure: MULTIPLE EXTRACION 1, 2, 4, 5, 6, 7, 8, 9, 10, 11, 12, 13, 14, 17, 18, 20, 23, 24, 25, 26, 28, 29, 32 WITH ALVEOLOPLASTY, REMOVE BILATERAL TORI;  Surgeon: SGae Bon DDS;  [place]  Service: Oral Surgery;  Laterality: N/A;  . Colonoscopy with esophagogastroduodenoscopy (egd) N/A 01/14/2013    Procedure: COLONOSCOPY WITH ESOPHAGOGASTRODUODENOSCOPY (EGD);  Surgeon: NRogene Houston MD;  [facility];  Service: Endoscopy;  Laterality: N/A;  250-moved to 315 Ann  to notify pt  . Colonoscopy N/A 02/04/2013    Procedure: COLONOSCOPY;  Surgeon: NRogene Houston MD;  [facility]: [facility];  Service: Endoscopy;  Laterality: N/A;  225  . Eus N/A 02/18/2013    Procedure: LOWER ENDOSCOPIC ULTRASOUND (EUS);  Surgeon: DMilus Banister MD;  [facility];  Service: Endoscopy;  Laterality: N/A;  . Flexible sigmoidoscopy N/A 07/26/2013    Procedure: FLEXIBLE SIGMOIDOSCOPY;  Surgeon: MJamesetta So MD;  Location: [facility] ORS;  Service: General;  Laterality: N/A;  . Abdominal perineal bowel resection N/A 07/26/2013    Procedure:  ABDOMINAL PERINEAL RESECTION;  Surgeon: MJamesetta So MD;  Location: [facility] ORS;  Service: General;  Laterality: N/A;  . Supracervical abdominal hysterectomy N/A 07/26/2013    Procedure: HYSTERECTOMY SUPRACERVICAL ABDOMINAL ;  Surgeon: MJamesetta So MD;  Location: [facility] ORS;  Service: General;  Laterality: N/A;  . Salpingoophorectomy Bilateral 07/26/2013    Procedure: SALPINGO OOPHORECTOMY;  Surgeon: MJamesetta So MD;  Location: [facility] ORS;  Service: General;  Laterality: Bilateral;  . Colostomy Left 07/26/2013    Procedure: COLOSTOMY;  Surgeon: MJamesetta So MD;  Location: [facility] ORS;  Service: General;  Laterality: Left;  . Colonoscopy N/A 05/12/2014    Procedure: COLONOSCOPY;  Surgeon: NRogene Houston MD;  Location: [facility]  ENDO SUITE;  Service: Endoscopy;  Laterality: N/A;  1030  . Colon surgery  2015    bowel resection/w colostomy  . Video assisted thoracoscopy Left 10/26/2014    Procedure: LEFT VIDEO ASSISTED THORACOSCOPY;  Surgeon: [name], MD;  Location: [place];  Service: Thoracic;  Laterality: Left;  . Segmentecomy Left 10/26/2014    Procedure: LEFT LOWER LOBE SUPERIOR SEGMENTECTOMY;  Surgeon: [name], MD;  Location: [place];  Service: Thoracic;  Laterality: Left;  . Lymph node dissection Left 10/26/2014    Procedure: LYMPH NODE DISSECTION;  Surgeon: [name], MD;  Location: [place];  Service: Thoracic;   Laterality: Left;  . Portacath placement Left 01/02/2015    Procedure: INSERTION PORT-A-CATH;  Surgeon: Aviva Signs, MD;  Location: [facility];  Service: General;  Laterality: Left;    Denies any headaches, dizziness, double vision, fevers, chills, night sweats, nausea, vomiting, diarrhea, constipation, chest pain, heart palpitations, shortness of breath, blood in stool, black tarry stool, urinary pain, urinary burning, urinary frequency, hematuria. 14 point review of systems was performed and is negative except as detailed under history of present illness and above  PHYSICAL EXAMINATION Vitals - 1 value per visit 06/23/2498  SYSTOLIC 370  DIASTOLIC 72  Pulse 78  Temperature 97.5  Respirations 18  Weight (lb)   Height   BMI    ECOG PERFORMANCE STATUS: 1  GENERAL:alert, no distress, well nourished, well developed, comfortable, cooperative, obese, smiling. In treatment chair. SKIN: skin color, texture, turgor are normal, no rashes or significant lesions HEAD: Normocephalic, No masses, lesions, tenderness or abnormalities EYES: normal, PERRLA, EOMI, Conjunctiva are pink and non-injected EARS: External ears normal OROPHARYNX:lips, buccal mucosa, and tongue normal and mucous membranes are moist  NECK: supple, no adenopathy, thyroid normal size, non-tender, without nodularity, no stridor, non-tender, trachea midline LYMPH:  no palpable lymphadenopathy BREAST:not examined LUNGS: clear to auscultation  HEART: regular rate & rhythm, no murmurs and no gallops ABDOMEN:abdomen soft, obese and normal bowel sounds BACK: Back symmetric, no curvature., No CVA tenderness EXTREMITIES:less then 2 second capillary refill, no joint deformities, effusion, or inflammation, no skin discoloration, no clubbing, no cyanosis BLE TED hose NEURO: alert & oriented x 3, no focal motor/sensory deficits, gait normal but slow   LABORATORY DATA: I have reviewed the results listed below CBC    Component Value  Date/Time   WBC 3.1* 06/07/2015 1004   RBC 3.11* 06/07/2015 1004   RBC 3.84* 11/07/2009 1649   HGB 9.4* 06/07/2015 1004   HCT 29.3* 06/07/2015 1004   PLT 138* 06/07/2015 1004   MCV 94.2 06/07/2015 1004   MCH 30.2 06/07/2015 1004   MCHC 32.1 06/07/2015 1004   RDW 16.7* 06/07/2015 1004   LYMPHSABS 0.4* 06/07/2015 1004   MONOABS 0.6 06/07/2015 1004   EOSABS 0.3 06/07/2015 1004   BASOSABS 0.0 06/07/2015 1004   CMP     Component Value Date/Time   NA 138 06/07/2015 1004   K 4.8 06/07/2015 1004   CL 105 06/07/2015 1004   CO2 26 06/07/2015 1004   GLUCOSE 135* 06/07/2015 1004   BUN 33* 06/07/2015 1004   CREATININE 1.22* 06/07/2015 1004   CREATININE 1.11* 02/08/2013 1451   CALCIUM 8.5* 06/07/2015 1004   PROT 6.7 06/07/2015 1004   ALBUMIN 3.5 06/07/2015 1004   AST 22 06/07/2015 1004   ALT 20 06/07/2015 1004   ALKPHOS 133* 06/07/2015 1004   BILITOT 0.3 06/07/2015 1004   GFRNONAA 46* 06/07/2015 1004   GFRNONAA 54*  02/08/2013 1451   GFRAA 53* 06/07/2015 1004   GFRAA 62 02/08/2013 1451     PATHOLOGYF SURGICAL PATHOLOGY FIAL DIAGNOSIS Diagnosis 1. Lung, wedge biopsy/resection, superior segment of left lower lobe - METASTATIC ADENOCARCINOMA, CONSISTENT WITH COLONIC PRIMARY, SPANNING 1.5 CM. - LYMPHOVASCULAR INVASION IS IDENTIFIED. - THE SURGICAL RESECTION MARGINS ARE NEGATIVE FOR CARCINOMA. - SEE COMMENT. 2. Lymph node, biopsy, 9 L - THERE IS NO EVIDENCE OF CARCINOMA IN 1 OF 1 LYMPH NODE (0/1). 3. Lymph node, biopsy, 9 L #2 - THERE IS NO EVIDENCE OF CARCINOMA IN 1 OF 1 LYMPH NODE (0/1). 4. Lymph node, biopsy, 11 L - THERE IS NO EVIDENCE OF CARCINOMA IN 1 OF 1 LYMPH NODE (0/1). 5. Lymph node, biopsy, 5 - THERE IS NO EVIDENCE OF CARCINOMA IN 1 OF 1 LYMPH NODE (0/1). Microscopic Comment 1. A block will be sent for KRAS testing and the results reported separately. Additional testing can be performed upon clinician request. (JBK:ds 10/27/14) [name],  Electronic Signature (Case signed 10/27/2014) Intraoperative  RADIOGRAPHIC STUDIES: I have personally reviewed the radiological images as listed and agreed with the findings in the report. Study Result     CLINICAL DATA: Stage IV colorectal cancer. Chemotherapy ongoing. Subsequent encounter.  EXAM: CT CHEST, ABDOMEN, AND PELVIS WITH CONTRAST  TECHNIQUE: Multidetector CT imaging of the chest, abdomen and pelvis was performed following the standard protocol during bolus administration of intravenous contrast.  CONTRAST: 179m OMNIPAQUE IOHEXOL 300 MG/ML SOLN  COMPARISON: CTs 03/07/2014. PET-CT 10/07/2014.  FINDINGS: CT CHEST FINDINGS  Mediastinum/Nodes: There are no enlarged mediastinal, hilar or axillary lymph nodes. Small axillary lymph nodes bilaterally are stable. The heart size is normal. There is no pericardial effusion. Left subclavian Port-A-Cath tip in the left brachiocephalic vein. No significant vascular findings.  Lungs/Pleura: There is no pleural effusion.Interval wedge resection of metastasis in the superior segment of the left lower lobe. For no new nodules demonstrated. There are low lung volumes and mild perihilar atelectasis bilaterally.  Musculoskeletal/Chest wall: No chest wall mass or lytic lesion identified. Heterogeneous sclerosis throughout the spine is again noted associated with changes of diffuse idiopathic skeletal hyperostosis. These marrow changes are chronic, and given the associated findings in the bony pelvis may be related to Paget's disease.  CT ABDOMEN AND PELVIS FINDINGS  Hepatobiliary: The hepatic density is diffusely decreased. No focal hepatic lesions are identified. No evidence of gallstones, gallbladder wall thickening or biliary dilatation.  Pancreas: Atrophy without focal abnormality or surrounding inflammation.  Spleen: Normal in size without focal abnormality.  Adrenals/Urinary Tract: The adrenal  glands appear stable with low-density prominence of the right gland, consistent with an adenoma. The right kidney appears stable without significant findings. Advanced cortical scarring and atrophy on the left are again noted. No evidence of renal mass or hydronephrosis. Possible postsurgical changes in the left renal pelvis, stable. The bladder appears unremarkable.  Stomach/Bowel: No evidence of bowel wall thickening, distention or surrounding inflammatory change. Descending colostomy noted with increased parastomal herniation of omental fat and colon. No evidence of incarceration. The appendix appears normal.  Vascular/Lymphatic: There are no enlarged abdominal or pelvic lymph nodes. No significant vascular findings are present.  Reproductive: Hysterectomy. Prominent soft tissue anterior to the sacrum is unchanged, probably due to a combination of cervix and presacral fibrosis from previous APR. This was not hypermetabolic on PET-CT.  Other: None.  Musculoskeletal: No acute or significant osseous findings. Stable heterogeneous sclerosis and trabecular thickening in the pelvis and lumbar spine, attributed  to Paget's disease.  IMPRESSION: 1. Interval wedge resection of metastasis within the left lower lobe. 2. No evidence of new metastatic disease within the chest, abdomen or pelvis. 3. Slightly increased parastomal herniation of omental fat and colon status post APR. Presacral soft tissue appears unchanged. 4. Port-A-Cath tip in the left brachiocephalic vein.   Electronically Signed  By: Richardean Sale M.D.  On: 01/09/2015 16:29    ASSESSMENT AND PLAN:  Stage IV CRC with solitary pulmonary nodule, s/p resection History of locally advanced rectal cancer Prior tobacco abuse Pagets disease of bone Anemia CKD CEA was elevated at time of initial diagnosis in 2014, but not with recent recurrence Limited decision making capacity  She has undergone successful  surgical resection of the solitary pulmonary nodule. Unfortunately pathology was c/w recurrent CRC. The patient and her caregivers opted to proceed with therapy and she is currently receiving FOLFOX/AVASTIN  She has anemia and is asymptomatic.She is on renal dosing of aranesp, dose was adjusted on 04/26/2015. Ferritin today is pending. She denies neuropathy.   Given platelet count will hold today. She will return in one week for repeat labs and if platelets 100K or greater will treat with her final cycle. Will discuss ongoing surveillance recommendations with her caregiver at follow-up.  All questions were answered. The patient knows to call the clinic with any problems, questions or concerns. We can certainly see the patient much sooner if necessary.   This document serves as a record of services personally performed by Ancil Linsey, MD. It was created on her behalf by Kandace Blitz, a trained medical scribe. The creation of this record is based on the scribe's personal observations and the provider's statements to them. This document has been checked and approved by the attending provider.  I have reviewed the above documentation for accuracy and completeness, and I agree with the above.  This note was electronically signed.  K[place]by Fam. Whitney Muse, MD

## 2015-06-21 NOTE — Progress Notes (Signed)
Defer chemo x 1 week per MD, platelets 90,000.  [name] presents today for injection per MD orders. Aranesp 100 mcg administered SQ in right Abdomen. Administration without incident. Patient tolerated well.  Tolerated magnesium infusion well. Stable on discharge home with caregiver via wheelchair.

## 2015-06-21 NOTE — Patient Instructions (Addendum)
[place] at [facility] Discharge Instructions  RECOMMENDATIONS MADE BY THE CONSULTANT AND ANY TEST RESULTS WILL BE SENT TO YOUR REFERRING PHYSICIAN.  No chemotherapy today. Platelets 90,000, need to be 100,000 to take chemo. IV magnesium infusion given today. Hemoglobin 9.5 today. Aranesp 100 mcg injection given as ordered for hemoglobin less than 10. You were given a [name] RX for magnesium at home. Take as directed. Return next week as scheduled for chemo.  Thank you for choosing [place] at [facility] to provide your oncology and hematology care.  To afford each patient quality time with our provider, please arrive at least 15 minutes before your scheduled appointment time.   Beginning January 23rd 2017 lab work for the [facility] will be done in the  Main lab at [facility] on 1st floor. If you have a lab appointment with the [facility] please come in thru the  Main Entrance and check in at the main information desk  You need to re-schedule your appointment should you arrive 10 or more minutes late.  We strive to give you quality time with our providers, and arriving late affects you and other patients whose appointments are after yours.  Also, if you no show three or more times for appointments you may be dismissed from the clinic at the providers discretion.     Again, thank you for choosing [facility].  Our hope is that these requests will decrease the amount of time that you wait before being seen by our physicians.       _____________________________________________________________  Should you have questions after your visit to [facility], please contact our office at [phone] between the hours of 8:30 a.m. and 4:30 p.m.  Voicemails left after 4:30 p.m. will not be returned until the following business day.  For prescription refill requests, have your pharmacy contact our office.          Resources For Cancer Patients and their Caregivers ? American Cancer Society: Can assist with transportation, wigs, general needs, runs Look Good Feel Better.        214-705-8712 ? Cancer Care: Provides financial assistance, online support groups, medication/co-pay assistance.  1-800-813-HOPE (585)548-6835) ? [place] Assists [place] cancer patients and their families through emotional , educational and financial support.  765 412 6422 ? [place] DSS Where to apply for food stamps, Medicaid and utility assistance. 424-840-5818 ? [employer]: Transportation to medical appointments. 7316741393 ? Social Security Administration: May apply for disability if have a Stage IV cancer. 515-883-6335 608-539-4018 ? [employer], Disability and Transit Services: Assists with nutrition, care and transit needs. [phone]

## 2015-06-22 LAB — CEA: CEA: 5.7 ng/mL — ABNORMAL HIGH (ref 0.0–4.7)

## 2015-06-23 ENCOUNTER — Ambulatory Visit (HOSPITAL_COMMUNITY): Payer: Medicare Other

## 2015-06-23 ENCOUNTER — Other Ambulatory Visit (HOSPITAL_COMMUNITY): Payer: Medicare Other

## 2015-06-26 ENCOUNTER — Other Ambulatory Visit (HOSPITAL_COMMUNITY): Payer: Self-pay | Admitting: *Deleted

## 2015-06-27 ENCOUNTER — Ambulatory Visit (HOSPITAL_COMMUNITY): Payer: Medicare Other | Admitting: Oncology

## 2015-06-27 ENCOUNTER — Encounter (HOSPITAL_COMMUNITY): Payer: Medicare Other

## 2015-06-27 ENCOUNTER — Encounter (HOSPITAL_BASED_OUTPATIENT_CLINIC_OR_DEPARTMENT_OTHER): Payer: Medicare Other

## 2015-06-27 VITALS — BP 189/72 | HR 89 | Temp 98.8°F | Resp 16 | Wt 224.4 lb

## 2015-06-27 DIAGNOSIS — D631 Anemia in chronic kidney disease: Secondary | ICD-10-CM

## 2015-06-27 DIAGNOSIS — C20 Malignant neoplasm of rectum: Secondary | ICD-10-CM

## 2015-06-27 DIAGNOSIS — Z5111 Encounter for antineoplastic chemotherapy: Secondary | ICD-10-CM

## 2015-06-27 DIAGNOSIS — R911 Solitary pulmonary nodule: Secondary | ICD-10-CM

## 2015-06-27 DIAGNOSIS — E538 Deficiency of other specified B group vitamins: Secondary | ICD-10-CM

## 2015-06-27 DIAGNOSIS — C7800 Secondary malignant neoplasm of unspecified lung: Secondary | ICD-10-CM | POA: Diagnosis not present

## 2015-06-27 DIAGNOSIS — E875 Hyperkalemia: Secondary | ICD-10-CM

## 2015-06-27 DIAGNOSIS — Z5112 Encounter for antineoplastic immunotherapy: Secondary | ICD-10-CM | POA: Diagnosis not present

## 2015-06-27 DIAGNOSIS — N189 Chronic kidney disease, unspecified: Secondary | ICD-10-CM

## 2015-06-27 LAB — COMPREHENSIVE METABOLIC PANEL
ALBUMIN: 3.7 g/dL (ref 3.5–5.0)
ALK PHOS: 152 U/L — AB (ref 38–126)
ALT: 32 U/L (ref 14–54)
AST: 28 U/L (ref 15–41)
Anion gap: 9 (ref 5–15)
BUN: 31 mg/dL — AB (ref 6–20)
CALCIUM: 8.7 mg/dL — AB (ref 8.9–10.3)
CO2: 27 mmol/L (ref 22–32)
CREATININE: 1.2 mg/dL — AB (ref 0.44–1.00)
Chloride: 100 mmol/L — ABNORMAL LOW (ref 101–111)
GFR calc Af Amer: 55 mL/min — ABNORMAL LOW (ref >60)
GFR calc non Af Amer: 47 mL/min — ABNORMAL LOW (ref >60)
GLUCOSE: 134 mg/dL — AB (ref 65–99)
Potassium: 5.3 mmol/L — ABNORMAL HIGH (ref 3.5–5.1)
SODIUM: 136 mmol/L (ref 135–145)
TOTAL PROTEIN: 7 g/dL (ref 6.5–8.1)
Total Bilirubin: 0.3 mg/dL (ref 0.3–1.2)

## 2015-06-27 LAB — CBC WITH DIFFERENTIAL/PLATELET
BASOS PCT: 0 %
Basophils Absolute: 0 10*3/uL (ref 0.0–0.1)
EOS ABS: 0.1 10*3/uL (ref 0.0–0.7)
Eosinophils Relative: 3 %
HEMATOCRIT: 30.9 % — AB (ref 36.0–46.0)
HEMOGLOBIN: 9.8 g/dL — AB (ref 12.0–15.0)
Lymphocytes Relative: 12 %
Lymphs Abs: 0.5 10*3/uL — ABNORMAL LOW (ref 0.7–4.0)
MCH: 29.8 pg (ref 26.0–34.0)
MCHC: 31.7 g/dL (ref 30.0–36.0)
MCV: 93.9 fL (ref 78.0–100.0)
Monocytes Absolute: 0.5 10*3/uL (ref 0.1–1.0)
Monocytes Relative: 11 %
NEUTROS ABS: 3.4 10*3/uL (ref 1.7–7.7)
NEUTROS PCT: 74 %
Platelets: 145 10*3/uL — ABNORMAL LOW (ref 150–400)
RBC: 3.29 MIL/uL — AB (ref 3.87–5.11)
RDW: 17.7 % — ABNORMAL HIGH (ref 11.5–15.5)
WBC: 4.6 10*3/uL (ref 4.0–10.5)

## 2015-06-27 LAB — URINALYSIS, DIPSTICK ONLY
BILIRUBIN URINE: NEGATIVE
GLUCOSE, UA: NEGATIVE mg/dL
HGB URINE DIPSTICK: NEGATIVE
Ketones, ur: NEGATIVE mg/dL
Leukocytes, UA: NEGATIVE
NITRITE: NEGATIVE
PH: 5.5 (ref 5.0–8.0)
Protein, ur: NEGATIVE mg/dL

## 2015-06-27 LAB — MAGNESIUM: Magnesium: 1.4 mg/dL — ABNORMAL LOW (ref 1.7–2.4)

## 2015-06-27 MED ORDER — SODIUM CHLORIDE 0.9 % IV SOLN
2040.0000 mg/m2 | INTRAVENOUS | Status: DC
  Administered 2015-06-27: 4450 mg via INTRAVENOUS
  Filled 2015-06-27: qty 89

## 2015-06-27 MED ORDER — SODIUM CHLORIDE 0.9 % IV SOLN
Freq: Once | INTRAVENOUS | Status: AC
  Administered 2015-06-27: 12:00:00 via INTRAVENOUS

## 2015-06-27 MED ORDER — HEPARIN SOD (PORK) LOCK FLUSH 100 UNIT/ML IV SOLN: 250.0000 [IU] | Freq: Once | INTRAVENOUS | Status: DC | PRN

## 2015-06-27 MED ORDER — SODIUM CHLORIDE 0.9 % IV SOLN
5.0000 mg/kg | Freq: Once | INTRAVENOUS | Status: AC
  Administered 2015-06-27: 525 mg via INTRAVENOUS
  Filled 2015-06-27: qty 5

## 2015-06-27 MED ORDER — SODIUM CHLORIDE 0.9 % IJ SOLN: 10.0000 mL | INTRAMUSCULAR | Status: DC | PRN

## 2015-06-27 MED ORDER — CYANOCOBALAMIN 1000 MCG/ML IJ SOLN
1000.0000 ug | Freq: Once | INTRAMUSCULAR | Status: AC
  Administered 2015-06-27: 1000 ug via INTRAMUSCULAR
  Filled 2015-06-27: qty 1

## 2015-06-27 MED ORDER — MAGNESIUM SULFATE 2 GM/50ML IV SOLN
2.0000 g | Freq: Once | INTRAVENOUS | Status: AC
  Administered 2015-06-27: 2 g via INTRAVENOUS
  Filled 2015-06-27: qty 50

## 2015-06-27 MED ORDER — LEUCOVORIN CALCIUM INJECTION 350 MG
400.0000 mg/m2 | Freq: Once | INTRAVENOUS | Status: AC
  Administered 2015-06-27: 868 mg via INTRAVENOUS
  Filled 2015-06-27: qty 43.4

## 2015-06-27 MED ORDER — SODIUM POLYSTYRENE SULFONATE 15 GM/60ML PO SUSP
30.0000 g | Freq: Once | ORAL | Status: AC
  Administered 2015-06-27: 30 g via ORAL
  Filled 2015-06-27: qty 120

## 2015-06-27 MED ORDER — DEXTROSE 5 % IV SOLN
Freq: Once | INTRAVENOUS | Status: AC
  Administered 2015-06-27: 14:00:00 via INTRAVENOUS

## 2015-06-27 MED ORDER — SODIUM CHLORIDE 0.9 % IJ SOLN: 3.0000 mL | INTRAMUSCULAR | Status: DC | PRN

## 2015-06-27 MED ORDER — HEPARIN SOD (PORK) LOCK FLUSH 100 UNIT/ML IV SOLN: 500.0000 [IU] | Freq: Once | INTRAVENOUS | Status: DC | PRN

## 2015-06-27 MED ORDER — OXALIPLATIN CHEMO INJECTION 100 MG/20ML
65.4500 mg/m2 | Freq: Once | INTRAVENOUS | Status: AC
  Administered 2015-06-27: 140 mg via INTRAVENOUS
  Filled 2015-06-27: qty 28

## 2015-06-27 MED ORDER — SODIUM CHLORIDE 0.9 % IV SOLN
Freq: Once | INTRAVENOUS | Status: AC
  Administered 2015-06-27: 14:00:00 via INTRAVENOUS
  Filled 2015-06-27: qty 4

## 2015-06-27 NOTE — Progress Notes (Signed)
Please see other encounter for documentation.

## 2015-06-27 NOTE — Progress Notes (Signed)
PA made aware of elevated BP, patient asymptomatic.  No further orders.  Patient left the clinic with caregiver in wheelchair.

## 2015-06-27 NOTE — Progress Notes (Signed)
Patient's reviewed today.  Hyperkalemia noted.  Nursing reports patient recently started Lasix with daily K+ replacement.  30 g of Kayexelate today in the clinic is ordered.  Patient is to hold K+.  We will perform labs as scheduled on 4/21 and I have added a K+ check to this lab appointment.  Further directions will follow accordingly.   Hypomagnesemia noted.  2 g of IV mag will be given today.  Tx today.   Onpro as scheduled.  KEFALAS,THOMAS, PA-C 06/27/2015 11:40 AM

## 2015-06-27 NOTE — Patient Instructions (Addendum)
[facility] Discharge Instructions for Patients Receiving Chemotherapy   Beginning January 23rd 2017 lab work for the [facility] will be done in the  Main lab at [facility] on 1st floor. If you have a lab appointment with the [facility] please come in thru the  Main Entrance and check in at the main information desk   Today you received the following chemotherapy agents: oxaliplatin, avastin, and leucovorin.     If you develop nausea and vomiting, or diarrhea that is not controlled by your medication, call the clinic.  The clinic phone number is [phone]. Office hours are Monday-Friday 8:30am-5:00pm.  BELOW ARE SYMPTOMS THAT SHOULD BE REPORTED IMMEDIATELY:  *FEVER GREATER THAN 101.0 F  *CHILLS WITH OR WITHOUT FEVER  NAUSEA AND VOMITING THAT IS NOT CONTROLLED WITH YOUR NAUSEA MEDICATION  *UNUSUAL SHORTNESS OF BREATH  *UNUSUAL BRUISING OR BLEEDING  TENDERNESS IN MOUTH AND THROAT WITH OR WITHOUT PRESENCE OF ULCERS  *URINARY PROBLEMS  *BOWEL PROBLEMS  UNUSUAL RASH Items with * indicate a potential emergency and should be followed up as soon as possible. If you have an emergency after office hours please contact your primary care physician or go to the nearest emergency department.  Please call the clinic during office hours if you have any questions or concerns.   You may also contact the Patient Navigator at [phone] should you have any questions or need assistance in obtaining follow up care.      Resources For Cancer Patients and their Caregivers ? American Cancer Society: Can assist with transportation, wigs, general needs, runs Look Good Feel Better.        ([phone] ? Cancer Care: Provides financial assistance, online support groups, medication/co-pay assistance.  1-800-813-HOPE 708-607-2409) ? [place] Assists [place] cancer patients and their families through emotional , educational and financial  support.  437-421-6335 ? [place] DSS Where to apply for food stamps, Medicaid and utility assistance. 314-678-3682 ? [employer]: Transportation to medical appointments. 418-662-8313 ? Social Security Administration: May apply for disability if have a Stage IV cancer. [phone] 425-419-4430 ? [employer], Disability and Transit Services: Assists with nutrition, care and transit needs. [phone]

## 2015-06-28 ENCOUNTER — Inpatient Hospital Stay (HOSPITAL_COMMUNITY): Payer: Medicare Other

## 2015-06-29 ENCOUNTER — Encounter (HOSPITAL_BASED_OUTPATIENT_CLINIC_OR_DEPARTMENT_OTHER): Payer: Medicare Other

## 2015-06-29 VITALS — BP 165/66 | HR 80 | Temp 98.9°F | Resp 20

## 2015-06-29 DIAGNOSIS — C7800 Secondary malignant neoplasm of unspecified lung: Secondary | ICD-10-CM

## 2015-06-29 DIAGNOSIS — C20 Malignant neoplasm of rectum: Secondary | ICD-10-CM | POA: Diagnosis not present

## 2015-06-29 DIAGNOSIS — Z5189 Encounter for other specified aftercare: Secondary | ICD-10-CM

## 2015-06-29 DIAGNOSIS — R911 Solitary pulmonary nodule: Secondary | ICD-10-CM

## 2015-06-29 MED ORDER — PEGFILGRASTIM 6 MG/0.6ML ~~LOC~~ PSKT
PREFILLED_SYRINGE | SUBCUTANEOUS | Status: AC
  Filled 2015-06-29: qty 0.6

## 2015-06-29 MED ORDER — HEPARIN SOD (PORK) LOCK FLUSH 100 UNIT/ML IV SOLN
INTRAVENOUS | Status: AC
  Filled 2015-06-29: qty 5

## 2015-06-29 MED ORDER — SODIUM CHLORIDE 0.9% FLUSH
10.0000 mL | Freq: Once | INTRAVENOUS | Status: AC
  Administered 2015-06-29: 10 mL via INTRAVENOUS

## 2015-06-29 MED ORDER — SODIUM CHLORIDE 0.9 % IJ SOLN: 10.0000 mL | INTRAMUSCULAR | Status: DC | PRN

## 2015-06-29 MED ORDER — HEPARIN SOD (PORK) LOCK FLUSH 100 UNIT/ML IV SOLN
500.0000 [IU] | Freq: Once | INTRAVENOUS | Status: AC
  Administered 2015-06-29: 500 [IU] via INTRAVENOUS

## 2015-06-29 MED ORDER — PEGFILGRASTIM 6 MG/0.6ML ~~LOC~~ PSKT
6.0000 mg | PREFILLED_SYRINGE | Freq: Once | SUBCUTANEOUS | Status: AC
  Administered 2015-06-29: 6 mg via SUBCUTANEOUS

## 2015-06-29 MED ORDER — HEPARIN SOD (PORK) LOCK FLUSH 100 UNIT/ML IV SOLN: 500.0000 [IU] | Freq: Once | INTRAVENOUS | Status: DC | PRN

## 2015-06-29 NOTE — Patient Instructions (Signed)
Pump discontinued and onpro neulasta injector placed Return as scheduled

## 2015-06-29 NOTE — Progress Notes (Signed)
Emaley Applin returns today for port de access and flush after 46 hr continous infusion of 61f. Tolerated infusion without problems. Portacath located lt chest wall was  deaccessed and flushed with 260mNS and 500U/53m8meparin and needle removed intact.  Procedure without incident. Patient tolerated procedure well. ..LMarland KitchenKipp Laurenceso scheduled today for ONPRO neulasta on body injector. See MAR for administration details. Injector in place and engaged with green light indicator on flashing. Tolerated application with out problems.

## 2015-07-05 ENCOUNTER — Ambulatory Visit (HOSPITAL_COMMUNITY): Payer: Medicare Other

## 2015-07-05 ENCOUNTER — Other Ambulatory Visit (HOSPITAL_COMMUNITY): Payer: Medicare Other

## 2015-07-07 ENCOUNTER — Encounter (HOSPITAL_BASED_OUTPATIENT_CLINIC_OR_DEPARTMENT_OTHER): Payer: Medicare Other

## 2015-07-07 ENCOUNTER — Encounter (HOSPITAL_COMMUNITY): Payer: Self-pay | Admitting: Hematology & Oncology

## 2015-07-07 ENCOUNTER — Encounter (HOSPITAL_COMMUNITY): Payer: Medicare Other

## 2015-07-07 ENCOUNTER — Encounter (HOSPITAL_BASED_OUTPATIENT_CLINIC_OR_DEPARTMENT_OTHER): Payer: Medicare Other | Admitting: Hematology & Oncology

## 2015-07-07 VITALS — BP 160/77 | HR 82 | Temp 98.2°F | Resp 18 | Wt 216.0 lb

## 2015-07-07 DIAGNOSIS — E538 Deficiency of other specified B group vitamins: Secondary | ICD-10-CM

## 2015-07-07 DIAGNOSIS — N189 Chronic kidney disease, unspecified: Secondary | ICD-10-CM | POA: Diagnosis not present

## 2015-07-07 DIAGNOSIS — C7800 Secondary malignant neoplasm of unspecified lung: Secondary | ICD-10-CM | POA: Diagnosis not present

## 2015-07-07 DIAGNOSIS — C20 Malignant neoplasm of rectum: Secondary | ICD-10-CM | POA: Diagnosis not present

## 2015-07-07 DIAGNOSIS — M25662 Stiffness of left knee, not elsewhere classified: Secondary | ICD-10-CM

## 2015-07-07 DIAGNOSIS — D631 Anemia in chronic kidney disease: Secondary | ICD-10-CM

## 2015-07-07 DIAGNOSIS — E875 Hyperkalemia: Secondary | ICD-10-CM

## 2015-07-07 DIAGNOSIS — Z0000 Encounter for general adult medical examination without abnormal findings: Secondary | ICD-10-CM

## 2015-07-07 LAB — COMPREHENSIVE METABOLIC PANEL
ALBUMIN: 4 g/dL (ref 3.5–5.0)
ALK PHOS: 186 U/L — AB (ref 38–126)
ALT: 22 U/L (ref 14–54)
AST: 21 U/L (ref 15–41)
Anion gap: 13 (ref 5–15)
BUN: 32 mg/dL — ABNORMAL HIGH (ref 6–20)
CALCIUM: 9.4 mg/dL (ref 8.9–10.3)
CHLORIDE: 100 mmol/L — AB (ref 101–111)
CO2: 27 mmol/L (ref 22–32)
CREATININE: 1.47 mg/dL — AB (ref 0.44–1.00)
GFR calc Af Amer: 42 mL/min — ABNORMAL LOW (ref >60)
GFR calc non Af Amer: 37 mL/min — ABNORMAL LOW (ref >60)
GLUCOSE: 83 mg/dL (ref 65–99)
Potassium: 4.4 mmol/L (ref 3.5–5.1)
Sodium: 140 mmol/L (ref 135–145)
Total Bilirubin: 0.3 mg/dL (ref 0.3–1.2)
Total Protein: 7.5 g/dL (ref 6.5–8.1)

## 2015-07-07 LAB — CBC
HCT: 34.3 % — ABNORMAL LOW (ref 36.0–46.0)
Hemoglobin: 10.7 g/dL — ABNORMAL LOW (ref 12.0–15.0)
MCH: 29.8 pg (ref 26.0–34.0)
MCHC: 31.2 g/dL (ref 30.0–36.0)
MCV: 95.5 fL (ref 78.0–100.0)
PLATELETS: 180 10*3/uL (ref 150–400)
RBC: 3.59 MIL/uL — ABNORMAL LOW (ref 3.87–5.11)
RDW: 17.8 % — ABNORMAL HIGH (ref 11.5–15.5)
WBC: 20.3 10*3/uL — ABNORMAL HIGH (ref 4.0–10.5)

## 2015-07-07 MED ORDER — DARBEPOETIN ALFA 100 MCG/0.5ML IJ SOSY
PREFILLED_SYRINGE | INTRAMUSCULAR | Status: AC
  Filled 2015-07-07: qty 0.5

## 2015-07-07 MED ORDER — DARBEPOETIN ALFA 100 MCG/0.5ML IJ SOSY
100.0000 ug | PREFILLED_SYRINGE | Freq: Once | INTRAMUSCULAR | Status: AC
  Administered 2015-07-07: 100 ug via SUBCUTANEOUS

## 2015-07-07 NOTE — Patient Instructions (Addendum)
[place] at [facility] Discharge Instructions  RECOMMENDATIONS MADE BY THE CONSULTANT AND ANY TEST RESULTS WILL BE SENT TO YOUR REFERRING PHYSICIAN.   Exam and discussion by Dr Whitney Muse today Aranesp today Aranesp every 2 weeks B12 monthly  Mammogram scheduled  Continue for magnesium order for 1 week then you can stop  Continue to hold potassium until next treatment  Return to see the doctor in 1 month Please call the clinic if you have any questions or concerns     Thank you for choosing [place] at [facility] to provide your oncology and hematology care.  To afford each patient quality time with our provider, please arrive at least 15 minutes before your scheduled appointment time.   Beginning January 23rd 2017 lab work for the [facility] will be done in the  Main lab at [facility] on 1st floor. If you have a lab appointment with the [facility] please come in thru the  Main Entrance and check in at the main information desk  You need to re-schedule your appointment should you arrive 10 or more minutes late.  We strive to give you quality time with our providers, and arriving late affects you and other patients whose appointments are after yours.  Also, if you no show three or more times for appointments you may be dismissed from the clinic at the providers discretion.     Again, thank you for choosing [facility].  Our hope is that these requests will decrease the amount of time that you wait before being seen by our physicians.       _____________________________________________________________  Should you have questions after your visit to [facility], please contact our office at [phone] between the hours of 8:30 a.m. and 4:30 p.m.  Voicemails left after 4:30 p.m. will not be returned until the following business day.  For prescription refill requests, have your pharmacy contact our office.          Resources For Cancer Patients and their Caregivers ? American Cancer Society: Can assist with transportation, wigs, general needs, runs Look Good Feel Better.        [phone] ? Cancer Care: Provides financial assistance, online support groups, medication/co-pay assistance.  1-800-813-HOPE 908-445-0018) ? [place] Assists [place] cancer patients and their families through emotional , educational and financial support.  (716)197-6884 ? [place] DSS Where to apply for food stamps, Medicaid and utility assistance. 503-447-5212 ? [employer]: Transportation to medical appointments. 706-712-7650 ? Social Security Administration: May apply for disability if have a Stage IV cancer. [phone] ? [employer], Disability and Transit Services: Assists with nutrition, care and transit needs. [phone]

## 2015-07-07 NOTE — Progress Notes (Signed)
[facility] [facility] [place], MD [place] [id number]  History of Locally advanced Rectal Cancer Recurrent disease with solitary pulmonary metastases, s/p resection  CURRENT THERAPY:Observation  INTERVAL HISTORY: Catalea Labrecque 65 y.o. female returns for followup of Locally advanced adenocarcinoma of the rectum, She tolerated combined modality therapy well. Radiation therapy completed on 05/04/2013 which was also the last day she received Xeloda, excellent tolerance, status post APR on 07/26/2013 with marked cytoreduction documented as evidenced by no lymph node involvement. pT3 tumor was found. S/P 6 months of Xeloda at 1000 mg BID 2 weeks on and 1 week off finishing in December 2015.   She has undergone successful resection of a solitary pulmonary nodule, unfortunately pathology was consistent with recurrent colorectal cancer.  She lives at [facility] off of [address].   Ms. Brutus returns to the [facility] today accompanied by her caregiver.  She says that her fingers and toes are fine. Her caregiver notes that her legs are stiff; that she doesn't like to bend them and doesn't want to bend them. She says that the back of her legs hurt, and that this has been happening for "a pretty good while." Her caregiver says she is not walking around a lot, and that it's gotten worse in the past couple of weeks. She also notes that they were swelling really bad before the doctor put her on Lasix. Her caregiver notes that they look a lot better since she started Lasix, but Ms. Dambrosio has still been unwilling to walk. She will walk from the bedroom, to the living room, to the kitchen. Compared to a year ago, she's a little bit less active than she was. Her caregiver remarks however that Zyann has never been particularly active.   She says she is doing fine. "I AM doing fine!" She is eating. Her caregiver  notes, "she never stops eating."   Ms. Pekar has not had a mammogram.  Her eye doctor was discussing the possibility of cataract surgery.  Her caregiver notes interest in physical therapy/rehab.  During the physical exam, she notes that her feet feel a little bit tingly and numb, but denies any pain when her legs are palpated. She denies numbness in her hands or fingers.     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   03/25/2013 - 05/05/2013 Chemotherapy Xeloda 1500 mg BID 5 days/week with radiation therapy.   07/26/2013 Definitive Surgery Dr. Arnoldo Morale- Flexible sigmoidoscopy, abdominoperineal resection, total abdominal hysterectomy with bilateral salpingo-oophorectomy   08/30/2013 - 03/01/2014 Chemotherapy Xeloda 1000 mg BID 14 days on and 7 days off x 6 months   03/08/2014 Imaging CT CAP- Bilateral pulmonary nodules, including an 8 mm left lower lobe nodule. Metastatic disease is a concern. The left lower lobe nodule has progressed since 03/05/2013, when it measured 5 mm.   05/26/2014 Imaging CT Chest- Continued enlargement of the superior segment left lower lobe nodule. Malignancy is likely.   In contrast, the right lower lobe nodule is probably benign.   08/17/2014 Imaging CT- Superior segment left lower lobe pulmonary nodule measures stable since the most recent comparison study, but is again noted to have increased in size when comparing to older studies. Continued close attention will be required as neoplasm remains a  con   10/26/2014 Surgery thoracoscopic left lower lobe superior segmentectomy. Pathology with metastatic adenocarcinoma c/w colonic primary   01/04/2015 -  Chemotherapy FOLFOX+Avastin (Avastin started on 01/18/2015).   01/09/2015 Imaging CT CAP- Interval wedge resection of metastasis within the left lower lobe. No evidence of  new metastatic disease within the chest, abdomen or pelvis.   02/15/2015 Treatment Plan Change Treatment deferred due to renal function change (patient poor historian) with N/V.   02/22/2015 Treatment Plan Change Added Aranesp at renal dosing to supportive therpay plan.   03/08/2015 Treatment Plan Change Defer treatment x 1 week   04/12/2015 Adverse Reaction Patient reported mouth sores.  None on exam.     04/12/2015 Treatment Plan Change 5FU bolus is discontinued for "mouth sores"   04/12/2015 Adverse Reaction Increasing Creatinine Cl.  CrCl calculated to be 30.7.     04/12/2015 Treatment Plan Change Oxaliplatin dose reduced by 20% to 65 mg/m2 based upon creatinine clearance.   05/24/2015 Treatment Plan Change 5FU CI decreased by 15%.       Past Medical History  Diagnosis Date  . Hypertension   . Diabetes mellitus     years  . Asthma   . Depression   . Mental retardation     stopped school at 9th grade   . History of recurrent UTIs   . Shortness of breath     with exertion  . Cancer (HCC)     Rectal   . Arthritis   . Gout   . Seizures ([place])     more than 4 yrs since last seizure. UNknown etiology  . Rectal cancer ([place])   . Iron deficiency 04/27/2014  . Chronic renal disease, stage 3, moderately decreased glomerular filtration rate between 30-59 mL/min/1.73 square meter 04/27/2014  . Numbness and tingling in hands     x several months   . GERD (gastroesophageal reflux disease)   . Vitamin B 12 deficiency 02/01/2015    Incidentally found without antibody testing.      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     has No Known Allergies.  Ms. TCapelldoes not currently have medications on file.  Past Surgical History  Procedure Laterality Date  . Abdominal hysterectomy    . Multiple extractions with alveoloplasty N/A 11/23/2012    Procedure: MULTIPLE EXTRACION 1, 2, 4, 5, 6, 7, 8, 9, 10, 11, 12, 13, 14, 17, 18, 20, 23, 24, 25, 26, 28, 29, 32 WITH ALVEOLOPLASTY, REMOVE BILATERAL TORI;  Surgeon: SGae Bon DDS;  Location: [facility]  Service: Oral Surgery;  Laterality: N/A;  . Colonoscopy with esophagogastroduodenoscopy (egd) N/A 01/14/2013    Procedure: COLONOSCOPY WITH ESOPHAGOGASTRODUODENOSCOPY (EGD);  Surgeon: NRogene Houston MD;  [facility];  Service: Endoscopy;  Laterality: N/A;  250-moved to 315 Ann to notify pt  . Colonoscopy N/A 02/04/2013    Procedure: COLONOSCOPY;  Surgeon: NRogene Houston MD;  [facility];  Service:  Endoscopy;  Laterality: N/A;  225  . Eus N/A 02/18/2013    Procedure: LOWER ENDOSCOPIC ULTRASOUND (EUS);  Surgeon: Milus Banister, MD;  Location[facility];  Service: Endoscopy;  Laterality: N/A;  . Flexible sigmoidoscopy N/A 07/26/2013    Procedure: FLEXIBLE SIGMOIDOSCOPY;  Surgeon: Jamesetta So, MD;  [facility];  Service: General;  Laterality: N/A;  . Abdominal perineal bowel resection N/A 07/26/2013    Procedure:  ABDOMINAL PERINEAL RESECTION;  Surgeon: Jamesetta So, MD;  [facility];  Service: General;  Laterality: N/A;  . Supracervical abdominal hysterectomy N/A 07/26/2013    Procedure: HYSTERECTOMY SUPRACERVICAL ABDOMINAL ;  Surgeon: Jamesetta So, MD;  [facility];  Service:  General;  Laterality: N/A;  . Salpingoophorectomy Bilateral 07/26/2013    Procedure: SALPINGO OOPHORECTOMY;  Surgeon: Jamesetta So, MD;  [facility];  Service: General;  Laterality: Bilateral;  . Colostomy Left 07/26/2013    Procedure: COLOSTOMY;  Surgeon: Jamesetta So, MD;  [facility];  Service: General;  Laterality: Left;  . Colonoscopy N/A 05/12/2014    Procedure: COLONOSCOPY;  Surgeon: Rogene Houston, MD;  [facility];  Service: Endoscopy;  Laterality: N/A;  1030  . Colon surgery  2015    bowel resection/w colostomy  . Video assisted thoracoscopy Left 10/26/2014    Procedure: LEFT VIDEO ASSISTED THORACOSCOPY;  Surgeon: [name], MD;  Location: [facility];  Service: Thoracic;  Laterality: Left;  . Segmentecomy Left 10/26/2014    Procedure: LEFT LOWER LOBE SUPERIOR SEGMENTECTOMY;  Surgeon: [name], MD;  Location: [facility];  Service: Thoracic;  Laterality: Left;  . Lymph node dissection Left 10/26/2014    Procedure: LYMPH NODE DISSECTION;  Surgeon: [name], MD;  Location: [facility];  Service: Thoracic;  Laterality: Left;  . Portacath placement Left 01/02/2015    Procedure: INSERTION PORT-A-CATH;  Surgeon: Aviva Signs, MD;  [facility];  Service: General;  Laterality: Left;    Denies any headaches, dizziness, double vision, fevers, chills, night sweats, nausea, vomiting, diarrhea, constipation, chest pain, heart palpitations, shortness of breath, blood in stool, black tarry stool, urinary pain, urinary burning, urinary frequency, hematuria. 14 point review of systems was performed and is negative except as detailed under history of present illness and above   PHYSICAL EXAMINATION Vitals - 1 value per visit 9/38/1829  SYSTOLIC 937  DIASTOLIC 77  Pulse 82  Temperature 98.2  Respirations 18  Weight (lb) 216  Height   BMI 37.06  VISIT REPORT    ECOG PERFORMANCE STATUS: 1  GENERAL:alert, no distress, well nourished, well developed,  comfortable, cooperative, obese, smiling. Gets onto exam table with assistance.  SKIN: skin color, texture, turgor are normal, no rashes or significant lesions HEAD: Normocephalic, No masses, lesions, tenderness or abnormalities EYES: normal, PERRLA, EOMI, Conjunctiva are pink and non-injected EARS: External ears normal OROPHARYNX:lips, buccal mucosa, and tongue normal and mucous membranes are moist  NECK: supple, no adenopathy, thyroid normal size, non-tender, without nodularity, no stridor, non-tender, trachea midline LYMPH:  no palpable lymphadenopathy BREAST:not examined LUNGS: clear to auscultation  HEART: regular rate & rhythm, no murmurs and no gallops ABDOMEN:abdomen soft, obese and normal bowel sounds BACK: Back symmetric, no curvature., No CVA tenderness EXTREMITIES:less then 2 second capillary refill, no joint deformities, effusion, or inflammation, no skin discoloration, no clubbing, no cyanosis BLE TED hose NEURO: alert & oriented x 3, no focal motor/sensory deficits, gait normal but slow  LABORATORY DATA: I have  reviewed the results listed below CBC    Component Value Date/Time   WBC 20.3* 07/07/2015 1108   RBC 3.59* 07/07/2015 1108   RBC 3.84* 11/07/2009 1649   HGB 10.7* 07/07/2015 1108   HCT 34.3* 07/07/2015 1108   PLT 180 07/07/2015 1108   MCV 95.5 07/07/2015 1108   MCH 29.8 07/07/2015 1108   MCHC 31.2 07/07/2015 1108   RDW 17.8* 07/07/2015 1108   LYMPHSABS 0.5* 06/27/2015 1020   MONOABS 0.5 06/27/2015 1020   EOSABS 0.1 06/27/2015 1020   BASOSABS 0.0 06/27/2015 1020   CMP     Component Value Date/Time   NA 140 07/07/2015 1108   K 4.4 07/07/2015 1108   CL 100* 07/07/2015 1108   CO2 27 07/07/2015 1108   GLUCOSE 83 07/07/2015 1108   BUN 32* 07/07/2015 1108   CREATININE 1.47* 07/07/2015 1108   CREATININE 1.11* 02/08/2013 1451   CALCIUM 9.4 07/07/2015 1108   PROT 7.5 07/07/2015 1108   ALBUMIN 4.0 07/07/2015 1108   AST 21 07/07/2015 1108   ALT 22  07/07/2015 1108   ALKPHOS 186* 07/07/2015 1108   BILITOT 0.3 07/07/2015 1108   GFRNONAA 37* 07/07/2015 1108   GFRNONAA 54* 02/08/2013 1451   GFRAA 42* 07/07/2015 1108   GFRAA 62 02/08/2013 1451     PATHOLOGYF SURGICAL PATHOLOGY FIAL DIAGNOSIS Diagnosis 1. Lung, wedge biopsy/resection, superior segment of left lower lobe - METASTATIC ADENOCARCINOMA, CONSISTENT WITH COLONIC PRIMARY, SPANNING 1.5 CM. - LYMPHOVASCULAR INVASION IS IDENTIFIED. - THE SURGICAL RESECTION MARGINS ARE NEGATIVE FOR CARCINOMA. - SEE COMMENT. 2. Lymph node, biopsy, 9 L - THERE IS NO EVIDENCE OF CARCINOMA IN 1 OF 1 LYMPH NODE (0/1). 3. Lymph node, biopsy, 9 L #2 - THERE IS NO EVIDENCE OF CARCINOMA IN 1 OF 1 LYMPH NODE (0/1). 4. Lymph node, biopsy, 11 L - THERE IS NO EVIDENCE OF CARCINOMA IN 1 OF 1 LYMPH NODE (0/1). 5. Lymph node, biopsy, 5 - THERE IS NO EVIDENCE OF CARCINOMA IN 1 OF 1 LYMPH NODE (0/1). Microscopic Comment 1. A block will be sent for KRAS testing and the results reported separately. Additional testing can be performed upon clinician request. (JBK:ds 10/27/14) [name], Electronic Signature (Case signed 10/27/2014) Intraoperative  RADIOGRAPHIC STUDIES: I have personally reviewed the radiological images as listed and agreed with the findings in the report. Study Result     CLINICAL DATA: Stage IV colorectal cancer. Chemotherapy ongoing. Subsequent encounter.  EXAM: CT CHEST, ABDOMEN, AND PELVIS WITH CONTRAST  TECHNIQUE: Multidetector CT imaging of the chest, abdomen and pelvis was performed following the standard protocol during bolus administration of intravenous contrast.  CONTRAST: 145m OMNIPAQUE IOHEXOL 300 MG/ML SOLN  COMPARISON: CTs 03/07/2014. PET-CT 10/07/2014.  FINDINGS: CT CHEST FINDINGS  Mediastinum/Nodes: There are no enlarged mediastinal, hilar or axillary lymph nodes. Small axillary lymph nodes bilaterally are stable. The heart size  is normal. There is no pericardial effusion. Left subclavian Port-A-Cath tip in the left brachiocephalic vein. No significant vascular findings.  Lungs/Pleura: There is no pleural effusion.Interval wedge resection of metastasis in the superior segment of the left lower lobe. For no new nodules demonstrated. There are low lung volumes and mild perihilar atelectasis bilaterally.  Musculoskeletal/Chest wall: No chest wall mass or lytic lesion identified. Heterogeneous sclerosis throughout the spine is again noted associated with changes of diffuse idiopathic skeletal hyperostosis. These marrow changes are chronic, and given the associated findings in the bony pelvis may be related to Paget's disease.  CT ABDOMEN AND  PELVIS FINDINGS  Hepatobiliary: The hepatic density is diffusely decreased. No focal hepatic lesions are identified. No evidence of gallstones, gallbladder wall thickening or biliary dilatation.  Pancreas: Atrophy without focal abnormality or surrounding inflammation.  Spleen: Normal in size without focal abnormality.  Adrenals/Urinary Tract: The adrenal glands appear stable with low-density prominence of the right gland, consistent with an adenoma. The right kidney appears stable without significant findings. Advanced cortical scarring and atrophy on the left are again noted. No evidence of renal mass or hydronephrosis. Possible postsurgical changes in the left renal pelvis, stable. The bladder appears unremarkable.  Stomach/Bowel: No evidence of bowel wall thickening, distention or surrounding inflammatory change. Descending colostomy noted with increased parastomal herniation of omental fat and colon. No evidence of incarceration. The appendix appears normal.  Vascular/Lymphatic: There are no enlarged abdominal or pelvic lymph nodes. No significant vascular findings are present.  Reproductive: Hysterectomy. Prominent soft tissue anterior to the sacrum  is unchanged, probably due to a combination of cervix and presacral fibrosis from previous APR. This was not hypermetabolic on PET-CT.  Other: None.  Musculoskeletal: No acute or significant osseous findings. 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  IMPRESSION: 1. Interval wedge resection of metastasis within the left lower lobe. 2. No evidence of new metastatic disease within the chest, abdomen or pelvis. 3. Slightly increased parastomal herniation of omental fat and colon status post APR. Presacral soft tissue appears unchanged. 4. Port-A-Cath tip in the left brachiocephalic vein.   Electronically Signed  By: Richardean Sale M.D.  On: 01/09/2015 16:29    ASSESSMENT AND PLAN:  Stage IV CRC with solitary pulmonary nodule, s/p resection History of locally advanced rectal cancer Prior tobacco abuse Pagets disease of bone Anemia CKD CEA was elevated at time of initial diagnosis in 2014, but not with recent recurrence Limited decision making capacity  She has undergone successful surgical resection of the solitary pulmonary nodule. Unfortunately pathology was c/w recurrent CRC. The patient and her caregivers opted to proceed with therapy and she has completed FOLFOX/AVASTIN  She did suprisingly well with therapy. We will proceed with recommended follow-up in accordance with NCCN guidelines at her return in 1 month.  NCCN guidelines for surveillance for Colon cancer are as follows (1.2017): C. Stage IV 1. H+P every 3-6 months x 2 years and then every 6 months for a total of 5 years  2. CEA every 3 months x 2 years and then every 6 months for a total of 3- 5 years  3. CT CAP every 3-6 months (category 2B for frequency < 6 months) x 2 years., then every 6-12 months for a total of 5 years . 4. Colonoscopy in 1 year except if no preoperative colonoscopy due to obstructing lesion, colonoscopy in  3-6 months.  A. If advanced adenoma, repeat in 1 year B. If no advanced adenoma, repeat in 3 years, then every 5 years  I have made a PT referral for evaluation and treatment. She will continue with aranesp for now for her anemia of CKD.  RTC in 1 month.   Orders Placed This Encounter  Procedures  . MM SCREENING BREAST TOMO BILATERAL    Standing Status: Future     Number of Occurrences:      Standing Expiration Date: 09/05/2016    Order Specific Question:  Reason for Exam (SYMPTOM  OR DIAGNOSIS REQUIRED)    Answer:  screening    Order Specific Question:  Preferred  imaging location?    Answer:  [facility]  . Comprehensive metabolic panel    Standing Status: Future     Number of Occurrences:      Standing Expiration Date: 07/06/2016  . Potassium    Standing Status: Future     Number of Occurrences:      Standing Expiration Date: 07/06/2016    All questions were answered. The patient knows to call the clinic with any problems, questions or concerns. We can certainly see the patient much sooner if necessary.   This document serves as a record of services personally performed by Ancil Linsey, MD. It was created on her behalf by Toni Amend, a trained medical scribe. The creation of this record is based on the scribe's personal observations and the provider's statements to them. This document has been checked and approved by the attending provider.  I have reviewed the above documentation for accuracy and completeness, and I agree with the above.  This note was electronically signed.  Kelby Fam. Whitney Muse, MD

## 2015-07-07 NOTE — Progress Notes (Signed)
[name] presents today for injection per MD orders. Aranesp 132mg administered SQ in Abdomen. Administration without incident. Patient tolerated well.

## 2015-07-10 ENCOUNTER — Encounter (HOSPITAL_COMMUNITY): Payer: Self-pay | Admitting: Hematology & Oncology

## 2015-07-21 ENCOUNTER — Encounter (HOSPITAL_COMMUNITY): Payer: Self-pay

## 2015-07-21 ENCOUNTER — Encounter (HOSPITAL_COMMUNITY): Payer: Medicare Other | Attending: Hematology & Oncology

## 2015-07-21 ENCOUNTER — Ambulatory Visit (HOSPITAL_COMMUNITY): Payer: Medicare Other

## 2015-07-21 ENCOUNTER — Encounter (HOSPITAL_COMMUNITY): Payer: Medicare Other

## 2015-07-21 VITALS — BP 168/68 | HR 74 | Temp 98.2°F | Resp 20

## 2015-07-21 DIAGNOSIS — F79 Unspecified intellectual disabilities: Secondary | ICD-10-CM | POA: Insufficient documentation

## 2015-07-21 DIAGNOSIS — M1990 Unspecified osteoarthritis, unspecified site: Secondary | ICD-10-CM | POA: Insufficient documentation

## 2015-07-21 DIAGNOSIS — N183 Chronic kidney disease, stage 3 (moderate): Secondary | ICD-10-CM | POA: Insufficient documentation

## 2015-07-21 DIAGNOSIS — Z85048 Personal history of other malignant neoplasm of rectum, rectosigmoid junction, and anus: Secondary | ICD-10-CM | POA: Insufficient documentation

## 2015-07-21 DIAGNOSIS — C19 Malignant neoplasm of rectosigmoid junction: Secondary | ICD-10-CM | POA: Insufficient documentation

## 2015-07-21 DIAGNOSIS — N189 Chronic kidney disease, unspecified: Principal | ICD-10-CM

## 2015-07-21 DIAGNOSIS — K219 Gastro-esophageal reflux disease without esophagitis: Secondary | ICD-10-CM | POA: Diagnosis not present

## 2015-07-21 DIAGNOSIS — R569 Unspecified convulsions: Secondary | ICD-10-CM | POA: Diagnosis not present

## 2015-07-21 DIAGNOSIS — M109 Gout, unspecified: Secondary | ICD-10-CM | POA: Diagnosis not present

## 2015-07-21 DIAGNOSIS — E538 Deficiency of other specified B group vitamins: Secondary | ICD-10-CM | POA: Diagnosis not present

## 2015-07-21 DIAGNOSIS — C7800 Secondary malignant neoplasm of unspecified lung: Secondary | ICD-10-CM | POA: Diagnosis not present

## 2015-07-21 DIAGNOSIS — M889 Osteitis deformans of unspecified bone: Secondary | ICD-10-CM | POA: Insufficient documentation

## 2015-07-21 DIAGNOSIS — D631 Anemia in chronic kidney disease: Secondary | ICD-10-CM | POA: Diagnosis present

## 2015-07-21 DIAGNOSIS — E119 Type 2 diabetes mellitus without complications: Secondary | ICD-10-CM | POA: Insufficient documentation

## 2015-07-21 DIAGNOSIS — Z9221 Personal history of antineoplastic chemotherapy: Secondary | ICD-10-CM | POA: Insufficient documentation

## 2015-07-21 DIAGNOSIS — Z0000 Encounter for general adult medical examination without abnormal findings: Secondary | ICD-10-CM

## 2015-07-21 DIAGNOSIS — Z923 Personal history of irradiation: Secondary | ICD-10-CM | POA: Diagnosis not present

## 2015-07-21 DIAGNOSIS — I129 Hypertensive chronic kidney disease with stage 1 through stage 4 chronic kidney disease, or unspecified chronic kidney disease: Secondary | ICD-10-CM | POA: Diagnosis not present

## 2015-07-21 DIAGNOSIS — Z9889 Other specified postprocedural states: Secondary | ICD-10-CM | POA: Diagnosis not present

## 2015-07-21 DIAGNOSIS — F329 Major depressive disorder, single episode, unspecified: Secondary | ICD-10-CM | POA: Diagnosis not present

## 2015-07-21 LAB — POTASSIUM: POTASSIUM: 4.8 mmol/L (ref 3.5–5.1)

## 2015-07-21 LAB — CBC
HCT: 35.3 % — ABNORMAL LOW (ref 36.0–46.0)
HEMOGLOBIN: 10.9 g/dL — AB (ref 12.0–15.0)
MCH: 29.5 pg (ref 26.0–34.0)
MCHC: 30.9 g/dL (ref 30.0–36.0)
MCV: 95.4 fL (ref 78.0–100.0)
Platelets: 165 10*3/uL (ref 150–400)
RBC: 3.7 MIL/uL — ABNORMAL LOW (ref 3.87–5.11)
RDW: 17.1 % — ABNORMAL HIGH (ref 11.5–15.5)
WBC: 5.5 10*3/uL (ref 4.0–10.5)

## 2015-07-21 MED ORDER — DARBEPOETIN ALFA 100 MCG/0.5ML IJ SOSY
PREFILLED_SYRINGE | INTRAMUSCULAR | Status: AC
  Filled 2015-07-21: qty 0.5

## 2015-07-21 MED ORDER — DARBEPOETIN ALFA 100 MCG/0.5ML IJ SOSY
100.0000 ug | PREFILLED_SYRINGE | Freq: Once | INTRAMUSCULAR | Status: AC
  Administered 2015-07-21: 100 ug via SUBCUTANEOUS

## 2015-07-21 NOTE — Progress Notes (Signed)
1330: [name] presents today for injection per the provider's orders.  Aranesp administration without incident; see MAR for injection details.  Patient tolerated procedure well and without incident.  No questions or complaints noted at this time.

## 2015-07-21 NOTE — Patient Instructions (Signed)
[place] at [facility] Discharge Instructions  RECOMMENDATIONS MADE BY THE CONSULTANT AND ANY TEST RESULTS WILL BE SENT TO YOUR REFERRING PHYSICIAN.  Aranesp injection today. Return as scheduled for lab work and injections. Return as scheduled for office visit.   Thank you for choosing [place] at [facility] to provide your oncology and hematology care.  To afford each patient quality time with our provider, please arrive at least 15 minutes before your scheduled appointment time.   Beginning January 23rd 2017 lab work for the [facility] will be done in the  Main lab at [facility] on 1st floor. If you have a lab appointment with the [facility] please come in thru the  Main Entrance and check in at the main information desk  You need to re-schedule your appointment should you arrive 10 or more minutes late.  We strive to give you quality time with our providers, and arriving late affects you and other patients whose appointments are after yours.  Also, if you no show three or more times for appointments you may be dismissed from the clinic at the providers discretion.     Again, thank you for choosing [facility].  Our hope is that these requests will decrease the amount of time that you wait before being seen by our physicians.       _____________________________________________________________  Should you have questions after your visit to [facility], please contact our office at [phone] between the hours of 8:30 a.m. and 4:30 p.m.  Voicemails left after 4:30 p.m. will not be returned until the following business day.  For prescription refill requests, have your pharmacy contact our office.         Resources For Cancer Patients and their Caregivers ? American Cancer Society: Can assist with transportation, wigs, general needs, runs Look Good Feel Better.        564-361-9977 ? Cancer  Care: Provides financial assistance, online support groups, medication/co-pay assistance.  1-800-813-HOPE (484)796-7895) ? [facility] Assists [place] cancer patients and their families through emotional , educational and financial support.  2131290534 ? [place] DSS Where to apply for food stamps, Medicaid and utility assistance. 5631467444 ? [facility]: Transportation to medical appointments. 972-556-7937 ? Social Security Administration: May apply for disability if have a Stage IV cancer. 531-363-9540 3858678968 ? [employer], Disability and Transit Services: Assists with nutrition, care and transit needs. 930-163-6966

## 2015-07-24 ENCOUNTER — Other Ambulatory Visit (HOSPITAL_COMMUNITY): Payer: Self-pay | Admitting: Oncology

## 2015-07-26 ENCOUNTER — Encounter (HOSPITAL_COMMUNITY): Payer: Self-pay | Admitting: *Deleted

## 2015-07-26 MED ORDER — CALCIUM CARBONATE ANTACID 500 MG PO CHEW: 2.0000 | CHEWABLE_TABLET | Freq: Three times a day (TID) | ORAL | Status: DC

## 2015-07-26 NOTE — Progress Notes (Signed)
Tums #180 with 1 refill escribed to [place], [place].

## 2015-07-31 ENCOUNTER — Other Ambulatory Visit (HOSPITAL_COMMUNITY): Payer: Self-pay | Admitting: Oncology

## 2015-08-01 ENCOUNTER — Other Ambulatory Visit (HOSPITAL_COMMUNITY): Payer: Self-pay | Admitting: *Deleted

## 2015-08-01 MED ORDER — CALCIUM CARBONATE ANTACID 500 MG PO CHEW: 2.0000 | CHEWABLE_TABLET | Freq: Three times a day (TID) | ORAL | Status: DC

## 2015-08-03 ENCOUNTER — Ambulatory Visit (HOSPITAL_COMMUNITY): Payer: Medicare Other

## 2015-08-03 ENCOUNTER — Ambulatory Visit (HOSPITAL_COMMUNITY): Admission: RE | Admit: 2015-08-03 | Payer: Medicare Other | Source: Ambulatory Visit

## 2015-08-03 ENCOUNTER — Other Ambulatory Visit (HOSPITAL_COMMUNITY): Payer: Self-pay | Admitting: Hematology & Oncology

## 2015-08-03 ENCOUNTER — Ambulatory Visit (HOSPITAL_COMMUNITY)
Admission: RE | Admit: 2015-08-03 | Discharge: 2015-08-03 | Disposition: A | Discharge status: Home / Self Care | Payer: Medicare Other | Source: Ambulatory Visit

## 2015-08-03 ENCOUNTER — Ambulatory Visit (HOSPITAL_COMMUNITY)
Admission: RE | Admit: 2015-08-03 | Discharge: 2015-08-03 | Disposition: A | Discharge status: Home / Self Care | Payer: Medicare Other | Source: Ambulatory Visit | Attending: Hematology & Oncology | Admitting: Hematology & Oncology

## 2015-08-03 DIAGNOSIS — Z1231 Encounter for screening mammogram for malignant neoplasm of breast: Secondary | ICD-10-CM

## 2015-08-04 ENCOUNTER — Encounter (HOSPITAL_BASED_OUTPATIENT_CLINIC_OR_DEPARTMENT_OTHER): Payer: Medicare Other | Admitting: Hematology & Oncology

## 2015-08-04 ENCOUNTER — Encounter (HOSPITAL_COMMUNITY): Payer: Medicare Other

## 2015-08-04 ENCOUNTER — Encounter (HOSPITAL_BASED_OUTPATIENT_CLINIC_OR_DEPARTMENT_OTHER): Payer: Medicare Other

## 2015-08-04 VITALS — BP 173/79 | HR 76 | Temp 98.3°F | Resp 20 | Wt 221.0 lb

## 2015-08-04 DIAGNOSIS — C7800 Secondary malignant neoplasm of unspecified lung: Secondary | ICD-10-CM | POA: Diagnosis not present

## 2015-08-04 DIAGNOSIS — C20 Malignant neoplasm of rectum: Secondary | ICD-10-CM

## 2015-08-04 DIAGNOSIS — Z0000 Encounter for general adult medical examination without abnormal findings: Secondary | ICD-10-CM

## 2015-08-04 DIAGNOSIS — E538 Deficiency of other specified B group vitamins: Secondary | ICD-10-CM

## 2015-08-04 DIAGNOSIS — D631 Anemia in chronic kidney disease: Secondary | ICD-10-CM | POA: Diagnosis not present

## 2015-08-04 DIAGNOSIS — N189 Chronic kidney disease, unspecified: Secondary | ICD-10-CM

## 2015-08-04 DIAGNOSIS — I10 Essential (primary) hypertension: Secondary | ICD-10-CM

## 2015-08-04 DIAGNOSIS — Z85048 Personal history of other malignant neoplasm of rectum, rectosigmoid junction, and anus: Secondary | ICD-10-CM

## 2015-08-04 LAB — COMPREHENSIVE METABOLIC PANEL
ALBUMIN: 3.8 g/dL (ref 3.5–5.0)
ALT: 18 U/L (ref 14–54)
ANION GAP: 9 (ref 5–15)
AST: 18 U/L (ref 15–41)
Alkaline Phosphatase: 146 U/L — ABNORMAL HIGH (ref 38–126)
BILIRUBIN TOTAL: 0.3 mg/dL (ref 0.3–1.2)
BUN: 44 mg/dL — AB (ref 6–20)
CALCIUM: 9.3 mg/dL (ref 8.9–10.3)
CHLORIDE: 99 mmol/L — AB (ref 101–111)
CO2: 29 mmol/L (ref 22–32)
CREATININE: 1.31 mg/dL — AB (ref 0.44–1.00)
GFR calc Af Amer: 49 mL/min — ABNORMAL LOW (ref >60)
GFR calc non Af Amer: 42 mL/min — ABNORMAL LOW (ref >60)
Glucose, Bld: 124 mg/dL — ABNORMAL HIGH (ref 65–99)
POTASSIUM: 4.4 mmol/L (ref 3.5–5.1)
SODIUM: 137 mmol/L (ref 135–145)
Total Protein: 7.3 g/dL (ref 6.5–8.1)

## 2015-08-04 LAB — CBC
HEMATOCRIT: 34.7 % — AB (ref 36.0–46.0)
Hemoglobin: 10.7 g/dL — ABNORMAL LOW (ref 12.0–15.0)
MCH: 29.1 pg (ref 26.0–34.0)
MCHC: 30.8 g/dL (ref 30.0–36.0)
MCV: 94.3 fL (ref 78.0–100.0)
PLATELETS: 164 10*3/uL (ref 150–400)
RBC: 3.68 MIL/uL — ABNORMAL LOW (ref 3.87–5.11)
RDW: 16.2 % — AB (ref 11.5–15.5)
WBC: 4.8 10*3/uL (ref 4.0–10.5)

## 2015-08-04 MED ORDER — DARBEPOETIN ALFA 100 MCG/0.5ML IJ SOSY
100.0000 ug | PREFILLED_SYRINGE | Freq: Once | INTRAMUSCULAR | Status: AC
  Administered 2015-08-04: 100 ug via SUBCUTANEOUS

## 2015-08-04 MED ORDER — CYANOCOBALAMIN 1000 MCG/ML IJ SOLN
1000.0000 ug | Freq: Once | INTRAMUSCULAR | Status: AC
  Administered 2015-08-04: 1000 ug via INTRAMUSCULAR

## 2015-08-04 MED ORDER — CYANOCOBALAMIN 1000 MCG/ML IJ SOLN
INTRAMUSCULAR | Status: AC
  Filled 2015-08-04: qty 1

## 2015-08-04 MED ORDER — DARBEPOETIN ALFA 100 MCG/0.5ML IJ SOSY
PREFILLED_SYRINGE | INTRAMUSCULAR | Status: AC
  Filled 2015-08-04: qty 0.5

## 2015-08-04 NOTE — Patient Instructions (Addendum)
[place] at [facility] Discharge Instructions  RECOMMENDATIONS MADE BY THE CONSULTANT AND ANY TEST RESULTS WILL BE SENT TO YOUR REFERRING PHYSICIAN.  We are going to continue your shots for your blood counts for now Keep working with Dr. Luan Pulling on your blood pressure I have ordered CT scans, we will see you after You would benefit from physical therapy   Thank you for choosing [place] at [facility] to provide your oncology and hematology care.  To afford each patient quality time with our provider, please arrive at least 15 minutes before your scheduled appointment time.   Beginning January 23rd 2017 lab work for the [facility] will be done in the  Main lab at [facility] on 1st floor. If you have a lab appointment with the [facility] please come in thru the  Main Entrance and check in at the main information desk  You need to re-schedule your appointment should you arrive 10 or more minutes late.  We strive to give you quality time with our providers, and arriving late affects you and other patients whose appointments are after yours.  Also, if you no show three or more times for appointments you may be dismissed from the clinic at the providers discretion.     Again, thank you for choosing [facility].  Our hope is that these requests will decrease the amount of time that you wait before being seen by our physicians.       _____________________________________________________________  Should you have questions after your visit to [facility], please contact our office at [phone] between the hours of 8:30 a.m. and 4:30 p.m.  Voicemails left after 4:30 p.m. will not be returned until the following business day.  For prescription refill requests, have your pharmacy contact our office.         Resources For Cancer Patients and their Caregivers ? American Cancer Society: Can assist with  transportation, wigs, general needs, runs Look Good Feel Better.        938-636-6306 ? Cancer Care: Provides financial assistance, online support groups, medication/co-pay assistance.  1-800-813-HOPE 802-502-1604) ? [place] Assists [place] cancer patients and their families through emotional , educational and financial support.  (507)264-5295 ? [place] DSS Where to apply for food stamps, Medicaid and utility assistance. 260-719-8044 ? [facility]: Transportation to medical appointments. (414)387-3667 ? Social Security Administration: May apply for disability if have a Stage IV cancer. 203-504-3090 910-339-1296 ? [employer], Disability and Transit Services: Assists with nutrition, care and transit needs. [place] Support Programs: '@10RELATIVEDAYS'$ @ > Cancer Support Group  2nd Tuesday of the month 1pm-2pm, Journey Room  > Creative Journey  3rd Tuesday of the month 1130am-1pm, Journey Room  > Look Good Feel Better  1st Wednesday of the month 10am-12 noon, Journey Room (Call [place] to register [phone])

## 2015-08-04 NOTE — Progress Notes (Signed)
Shekelia Boutin presents today for injection per MD orders. Aranesp 181mg administered SQ in abdominal tissue.   Administration without incident. Patient tolerated well.  LArva Slaughpresents today for injection per MD orders. B12 10093m administered IM in left Upper Arm. Administration without incident. Patient tolerated well.

## 2015-08-04 NOTE — Progress Notes (Signed)
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   03/25/2013 - 05/05/2013 Chemotherapy Xeloda 1500 mg BID 5 days/week with radiation therapy.   07/26/2013 Definitive Surgery Dr. Arnoldo Morale- Flexible sigmoidoscopy, abdominoperineal resection, total abdominal hysterectomy with bilateral salpingo-oophorectomy   08/30/2013 - 03/01/2014 Chemotherapy Xeloda 1000 mg BID 14 days on and 7 days off x 6 months   03/08/2014 Imaging CT CAP- Bilateral pulmonary nodules, including an 8 mm left lower lobe nodule. Metastatic disease is a concern. The left lower lobe nodule has progressed since 03/05/2013, when it measured 5 mm.   05/26/2014 Imaging CT Chest- Continued enlargement of the superior segment left lower lobe nodule. Malignancy is likely.   In contrast, the right lower lobe nodule is probably benign.   08/17/2014 Imaging CT- Superior segment left lower lobe pulmonary nodule measures stable since the most recent comparison study, but is again noted to have increased in size when comparing to older studies. Continued close attention will be required as neoplasm remains a con   10/26/2014 Surgery thoracoscopic left lower lobe superior segmentectomy. Pathology with metastatic adenocarcinoma c/w colonic primary   01/04/2015 -  Chemotherapy FOLFOX+Avastin (Avastin started on 01/18/2015).   01/09/2015 Imaging CT CAP- Interval wedge resection of metastasis within the left lower lobe. No evidence of new metastatic disease within the chest, abdomen or pelvis.   02/15/2015 Treatment Plan Change Treatment deferred due to renal  function change (patient poor historian) with N/V.   02/22/2015 Treatment Plan Change Added Aranesp at renal dosing to supportive therpay plan.   03/08/2015 Treatment Plan Change Defer treatment x 1 week   04/12/2015 Adverse Reaction Patient reported mouth sores.  None on exam.     04/12/2015 Treatment Plan Change 5FU bolus is discontinued for "mouth sores"   04/12/2015 Adverse Reaction Increasing Creatinine Cl.  CrCl calculated to be 30.7.     04/12/2015 Treatment Plan Change Oxaliplatin dose reduced by 20% to 65 mg/m2 based upon creatinine clearance.   05/24/2015 Treatment Plan Change 5FU CI decreased by 15%.    CURRENT THERAPY:Observation  INTERVAL HISTORY: [name] 65 y.o. female returns for followup of Locally advanced adenocarcinoma of the rectum, She tolerated combined modality therapy well. Radiation therapy completed on 05/04/2013 which was also the last day she received Xeloda, excellent tolerance, status post APR on 07/26/2013 with marked cytoreduction documented as evidenced by no lymph node involvement. pT3 tumor was found. S/P 6 months of Xeloda at 1000 mg BID 2 weeks on and 1 week off finishing in December 2015.   She has undergone successful resection of a solitary pulmonary nodule, unfortunately pathology was consistent with recurrent colorectal cancer.  She lives at [facility] off of [address].   Ms. Vandervort returns to the [facility] today accompanied by her caregiver.   Ms. Upperman herself says she's been doing better "and a little bit mean." Her caregiver says "she's not being mean."  Her caregiver notes that Ms. Liska wants to sit out in the sun all day; that she does not want to get out of the sunshine.  Her caregiver notes that she eats well.  Today, Ms. Grissom says that her legs are doing better, but her caregiver says "they still seem painful or tight when she is walking." Her caregiver confirms that she gets up and down, but that she "probably doesn't  walk as much as she needs to. I'm sure she doesn't." Her caregiver notes that she just walks between places she needs to go, from the bed to the table, to the porch, etc. She has always been this way with the walking.  When asked if she wants any more of that chemo, she says "nope." Other than this, she doesn't have many complaints.  Ms. Hauth has difficulty standing in order to ambulate to the exam table, and difficulty climbing up on to it. Both Ms. Enloe and her caregiver note no problems with her ostomy. During the physical exam, she also denies any belly pain or nausea. Ms. Diekmann confirms that she had her mammogram yesterday.  She sees Dr. Luan Pulling for primary care. Her caregiver notes that he took her off of the lisinopril; that they changed that. The caregiver further notes that Dr. Luan Pulling knows that her blood pressure runs high and that it's about time for another visit.   Past Medical History  Diagnosis Date  . Hypertension   . Diabetes mellitus     years  . Asthma   . Depression   . Mental retardation     stopped school at 9th grade   . History of recurrent UTIs   . Shortness of breath     with exertion  . Cancer (HCC)     Rectal   . Arthritis   . Gout   . Seizures ([place])     more than 4 yrs since last seizure. UNknown etiology  . Rectal cancer ([place])   . Iron deficiency 04/27/2014  . Chronic renal disease, stage 3, moderately decreased glomerular filtration rate between 30-59 mL/min/1.73 square meter 04/27/2014  . Numbness and tingling in hands     x several months   . GERD (gastroesophageal reflux disease)   . Vitamin B 12 deficiency 02/01/2015    Incidentally found without antibody testing.      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     has No Known Allergies.  Ms. TNippertdoes not currently have medications on file.  Past Surgical History  Procedure Laterality Date  . Abdominal hysterectomy    . Multiple extractions with alveoloplasty N/A 11/23/2012    Procedure: MULTIPLE EXTRACION 1, 2, 4, 5, 6, 7, 8, 9, 10, 11, 12, 13, 14, 17, 18, 20, 23, 24, 25, 26, 28, 29, 32 WITH ALVEOLOPLASTY, REMOVE BILATERAL TORI;  Surgeon: SGae Bon DDS;  [facility]  Service: Oral Surgery;  Laterality: N/A;  . Colonoscopy with esophagogastroduodenoscopy (egd) N/A 01/14/2013    Procedure: COLONOSCOPY WITH ESOPHAGOGASTRODUODENOSCOPY (EGD);  Surgeon: NRogene Houston MD;  [facility];  Service: Endoscopy;  Laterality: N/A;  250-moved to 315 Ann to notify  pt  . Colonoscopy N/A 02/04/2013    Procedure: COLONOSCOPY;  Surgeon: Rogene Houston, MD;  [facility];  Service: Endoscopy;  Laterality: N/A;  225  . Eus N/A 02/18/2013    Procedure: LOWER ENDOSCOPIC ULTRASOUND (EUS);  Surgeon: Milus Banister, MD;  Location[facility];  Service: Endoscopy;  Laterality: N/A;  . Flexible sigmoidoscopy N/A 07/26/2013    Procedure: FLEXIBLE SIGMOIDOSCOPY;  Surgeon: Jamesetta So, MD;  [facility]: [facility];  Service: General;  Laterality: N/A;  . Abdominal perineal bowel resection N/A 07/26/2013    Procedure:  ABDOMINAL PERINEAL RESECTION;  Surgeon: Jamesetta So, MD;  [facility];  Service: General;  Laterality: N/A;  .  Supracervical abdominal hysterectomy N/A 07/26/2013    Procedure: HYSTERECTOMY SUPRACERVICAL ABDOMINAL ;  Surgeon: Jamesetta So, MD;  [facility];  Service: General;  Laterality: N/A;  . Salpingoophorectomy Bilateral 07/26/2013    Procedure: SALPINGO OOPHORECTOMY;  Surgeon: Jamesetta So, MD;  [facility];  Service: General;  Laterality: Bilateral;  . Colostomy Left 07/26/2013    Procedure: COLOSTOMY;  Surgeon: Jamesetta So, MD;  Location[facility];  Service: General;  Laterality: Left;  . Colonoscopy N/A 05/12/2014    Procedure: COLONOSCOPY;  Surgeon: Rogene Houston, MD;  [facility];  Service: Endoscopy;  Laterality: N/A;  1030  . Colon surgery  2015    bowel resection/w colostomy  . Video assisted thoracoscopy Left 10/26/2014    Procedure: LEFT VIDEO ASSISTED THORACOSCOPY;  Surgeon: [name], MD;  Location: [facility];  Service: Thoracic;  Laterality: Left;  . Segmentecomy Left 10/26/2014    Procedure: LEFT LOWER LOBE SUPERIOR SEGMENTECTOMY;  Surgeon: [name], MD;  Location: [facility];  Service: Thoracic;  Laterality: Left;  . Lymph node dissection Left 10/26/2014    Procedure: LYMPH NODE DISSECTION;  Surgeon: [name], MD;  Location: [facility];  Service: Thoracic;  Laterality: Left;  . Portacath placement Left 01/02/2015    Procedure: INSERTION PORT-A-CATH;  Surgeon: Aviva Signs, MD;  [facility]: [facility];  Service: General;  Laterality: Left;    Denies any headaches, dizziness, double vision, fevers, chills, night sweats, nausea, vomiting, diarrhea, constipation, chest pain, heart palpitations, shortness of breath, blood in stool, black tarry stool, urinary pain, urinary burning, urinary frequency, hematuria. 14 point review of systems was performed and is negative except as detailed under history of present illness and above   PHYSICAL EXAMINATION BP 173/79 mmHg  Pulse 76  Temp(Src) 98.3 F (36.8 C) (Oral)  Resp 20  Wt 221 lb (100.245 kg)   SpO2 100%  ECOG PERFORMANCE STATUS: 1  GENERAL:alert, no distress, well nourished, well developed, comfortable, cooperative, obese, smiling. Gets onto exam table with assistance.  SKIN: skin color, texture, turgor are normal, no rashes or significant lesions HEAD: Normocephalic, No masses, lesions, tenderness or abnormalities EYES: normal, PERRLA, EOMI, Conjunctiva are pink and non-injected EARS: External ears normal OROPHARYNX:lips, buccal mucosa, and tongue normal and mucous membranes are moist  NECK: supple, no adenopathy, thyroid normal size, non-tender, without nodularity, no stridor, non-tender, trachea midline LYMPH:  no palpable lymphadenopathy BREAST:not examined LUNGS: clear to auscultation  HEART: regular rate & rhythm, no murmurs and no gallops ABDOMEN:abdomen soft, obese and normal bowel sounds BACK: Back symmetric, no curvature., No CVA tenderness EXTREMITIES:less then 2 second capillary refill, no joint deformities, effusion, or inflammation, no skin discoloration, no clubbing, no cyanosis BLE TED hose NEURO: alert & oriented x 3, no  focal motor/sensory deficits, gait normal but slow  LABORATORY DATA: I have reviewed the results listed below CBC    Component Value Date/Time   WBC 4.8 08/04/2015 1037   RBC 3.68* 08/04/2015 1037   RBC 3.84* 11/07/2009 1649   HGB 10.7* 08/04/2015 1037   HCT 34.7* 08/04/2015 1037   PLT 164 08/04/2015 1037   MCV 94.3 08/04/2015 1037   MCH 29.1 08/04/2015 1037   MCHC 30.8 08/04/2015 1037   RDW 16.2* 08/04/2015 1037   LYMPHSABS 0.5* 06/27/2015 1020   MONOABS 0.5 06/27/2015 1020   EOSABS 0.1 06/27/2015 1020   BASOSABS 0.0 06/27/2015 1020   CMP     Component Value Date/Time   NA 137 08/04/2015 1037   K 4.4 08/04/2015 1037   CL 99* 08/04/2015 1037   CO2 29 08/04/2015 1037   GLUCOSE 124* 08/04/2015 1037   BUN 44* 08/04/2015 1037   CREATININE 1.31* 08/04/2015 1037   CREATININE 1.11* 02/08/2013 1451   CALCIUM 9.3 08/04/2015 1037     PROT 7.3 08/04/2015 1037   ALBUMIN 3.8 08/04/2015 1037   AST 18 08/04/2015 1037   ALT 18 08/04/2015 1037   ALKPHOS 146* 08/04/2015 1037   BILITOT 0.3 08/04/2015 1037   GFRNONAA 42* 08/04/2015 1037   GFRNONAA 54* 02/08/2013 1451   GFRAA 49* 08/04/2015 1037   GFRAA 62 02/08/2013 1451    PATHOLOGYF SURGICAL PATHOLOGY FIAL DIAGNOSIS Diagnosis 1. Lung, wedge biopsy/resection, superior segment of left lower lobe - METASTATIC ADENOCARCINOMA, CONSISTENT WITH COLONIC PRIMARY, SPANNING 1.5 CM. - LYMPHOVASCULAR INVASION IS IDENTIFIED. - THE SURGICAL RESECTION MARGINS ARE NEGATIVE FOR CARCINOMA. - SEE COMMENT. 2. Lymph node, biopsy, 9 L - THERE IS NO EVIDENCE OF CARCINOMA IN 1 OF 1 LYMPH NODE (0/1). 3. Lymph node, biopsy, 9 L #2 - THERE IS NO EVIDENCE OF CARCINOMA IN 1 OF 1 LYMPH NODE (0/1). 4. Lymph node, biopsy, 11 L - THERE IS NO EVIDENCE OF CARCINOMA IN 1 OF 1 LYMPH NODE (0/1). 5. Lymph node, biopsy, 5 - THERE IS NO EVIDENCE OF CARCINOMA IN 1 OF 1 LYMPH NODE (0/1). Microscopic Comment 1. A block will be sent for KRAS testing and the results reported separately. Additional testing can be performed upon clinician request. (JBK:ds 10/27/14) [name], Electronic Signature (Case signed 10/27/2014) Intraoperative  RADIOGRAPHIC STUDIES: I have personally reviewed the radiological images as listed and agreed with the findings in the report. Study Result     CLINICAL DATA: Stage IV colorectal cancer. Chemotherapy ongoing. Subsequent encounter.  EXAM: CT CHEST, ABDOMEN, AND PELVIS WITH CONTRAST  TECHNIQUE: Multidetector CT imaging of the chest, abdomen and pelvis was performed following the standard protocol during bolus administration of intravenous contrast.  CONTRAST: 121m OMNIPAQUE IOHEXOL 300 MG/ML SOLN  COMPARISON: CTs 03/07/2014. PET-CT 10/07/2014.  FINDINGS: CT CHEST FINDINGS  Mediastinum/Nodes: There are no enlarged mediastinal, hilar  or axillary lymph nodes. Small axillary lymph nodes bilaterally are stable. The heart size is normal. There is no pericardial effusion. Left subclavian Port-A-Cath tip in the left brachiocephalic vein. No significant vascular findings.  Lungs/Pleura: There is no pleural effusion.Interval wedge resection of metastasis in the superior segment of the left lower lobe. For no new nodules demonstrated. There are low lung volumes and mild perihilar atelectasis bilaterally.  Musculoskeletal/Chest wall: No chest wall mass or lytic lesion identified. Heterogeneous sclerosis throughout the spine is again noted associated with changes of diffuse idiopathic skeletal hyperostosis. These marrow changes are chronic, and given the associated findings in the  bony pelvis may be related to Paget's disease.  CT ABDOMEN AND PELVIS FINDINGS  Hepatobiliary: The hepatic density is diffusely decreased. No focal hepatic lesions are identified. No evidence of gallstones, gallbladder wall thickening or biliary dilatation.  Pancreas: Atrophy without focal abnormality or surrounding inflammation.  Spleen: Normal in size without focal abnormality.  Adrenals/Urinary Tract: The adrenal glands appear stable with low-density prominence of the right gland, consistent with an adenoma. The right kidney appears stable without significant findings. Advanced cortical scarring and atrophy on the left are again noted. No evidence of renal mass or hydronephrosis. Possible postsurgical changes in the left renal pelvis, stable. The bladder appears unremarkable.  Stomach/Bowel: No evidence of bowel wall thickening, distention or surrounding inflammatory change. Descending colostomy noted with increased parastomal herniation of omental fat and colon. No evidence of incarceration. The appendix appears normal.  Vascular/Lymphatic: There are no enlarged abdominal or pelvic lymph nodes. No significant vascular  findings are present.  Reproductive: Hysterectomy. Prominent soft tissue anterior to the sacrum is unchanged, probably due to a combination of cervix and presacral fibrosis from previous APR. This was not hypermetabolic on PET-CT.  Other: None.  Musculoskeletal: No acute or significant osseous findings. 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  IMPRESSION: 1. Interval wedge resection of metastasis within the left lower lobe. 2. No evidence of new metastatic disease within the chest, abdomen or pelvis. 3. Slightly increased parastomal herniation of omental fat and colon status post APR. Presacral soft tissue appears unchanged. 4. Port-A-Cath tip in the left brachiocephalic vein.   Electronically Signed  By: Richardean Sale M.D.  On: 01/09/2015 16:29    ASSESSMENT AND PLAN:  Stage IV CRC with solitary pulmonary nodule, s/p resection History of locally advanced rectal cancer Prior tobacco abuse Pagets disease of bone Anemia CKD CEA was elevated at time of initial diagnosis in 2014, but not with recent recurrence Limited decision making capacity hypertension  She has undergone successful surgical resection of the solitary pulmonary nodule. Unfortunately pathology was c/w recurrent CRC. The patient and her caregivers opted to proceed with therapy and she has completed FOLFOX/AVASTIN  She did suprisingly well with therapy. We will proceed with recommended follow-up in accordance with NCCN guidelines.  NCCN guidelines for surveillance for Colon cancer are as follows (1.2017): C. Stage IV 1. H+P every 3-6 months x 2 years and then every 6 months for a total of 5 years  2. CEA every 3 months x 2 years and then every 6 months for a total of 3- 5 years  3. CT CAP every 3-6 months (category 2B for frequency < 6 months) x 2 years., then every 6-12 months for a total of 5 years . 4. Colonoscopy  in 1 year except if no preoperative colonoscopy due to obstructing lesion, colonoscopy in 3-6 months.  A. If advanced adenoma, repeat in 1 year B. If no advanced adenoma, repeat in 3 years, then every 5 years  I have made a PT referral for evaluation and treatment. This was not arranged therefore we have referred again. She will continue with aranesp for now for her anemia of CKD.  CT scans of C/A/P have been ordered. If WNL she will return 3 months post.   For her hypertension she follows with Dr. Luan Pulling.  RTC in 1 month.    Orders Placed This Encounter  Procedures  . CT Abdomen Pelvis W Contrast    Standing Status: Future  Number of Occurrences:      Standing Expiration Date: 08/03/2016    Order Specific Question:  If indicated for the ordered procedure, I authorize the administration of contrast media per Radiology protocol    Answer:  Yes    Order Specific Question:  Reason for Exam (SYMPTOM  OR DIAGNOSIS REQUIRED)    Answer:  restaging stage IV CRC    Order Specific Question:  Preferred imaging location?    Answer:  [facility]  . CT Chest W Contrast    Standing Status: Future     Number of Occurrences:      Standing Expiration Date: 08/03/2016    Order Specific Question:  If indicated for the ordered procedure, I authorize the administration of contrast media per Radiology protocol    Answer:  Yes    Order Specific Question:  Reason for Exam (SYMPTOM  OR DIAGNOSIS REQUIRED)    Answer:  restaging stage IV CRC    Order Specific Question:  Preferred imaging location?    Answer:  [facility]    All questions were answered. The patient knows to call the clinic with any problems, questions or concerns. We can certainly see the patient much sooner if necessary.   This document serves as a record of services personally performed by Ancil Linsey, MD. It was created on her behalf by Toni Amend, a trained medical scribe. The creation of this record is  based on the scribe's personal observations and the provider's statements to them. This document has been checked and approved by the attending provider.  I have reviewed the above documentation for accuracy and completeness, and I agree with the above.  This note was electronically signed.  Kelby Fam. Whitney Muse, MD

## 2015-08-05 ENCOUNTER — Encounter (HOSPITAL_COMMUNITY): Payer: Self-pay | Admitting: Hematology & Oncology

## 2015-08-10 IMAGING — PT NM PET TUM IMG RESTAG (PS) SKULL BASE T - THIGH
8 series · 25 of 25 positions shown · non-contrast
Comparison: PET of 03/05/2013.  Chest CTs of 08/17/14 and 05/26/2014.

CLINICAL DATA: Subsequent treatment strategy for restaging rectal
cancer. Enlarging pulmonary nodule..

EXAM:
NUCLEAR MEDICINE PET SKULL BASE TO THIGH
TECHNIQUE: 11.1 mCi F-18 FDG was injected intravenously. Full-ring PET imaging
was performed from the skull base to thigh after the radiotracer. CT
data was obtained and used for attenuation correction and anatomic
localization.
FASTING BLOOD GLUCOSE:  Value: 82 mg/dl

[Series 3: pet sk_thigh ac · axial · 5.0mm · 4.07mm/px · z∈[-881,-49]mm · 4 of 209 slices shown]
[im 1/209]
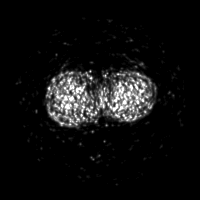
[im 70/209]
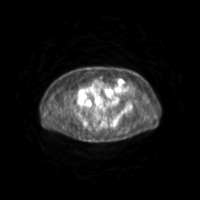
[im 139/209]
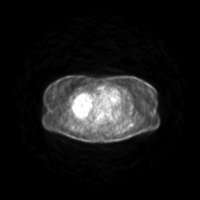
[im 209/209]
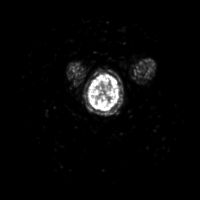

[Series 4: ct sk_thigh 5.0 hd_fov · axial · 5.0mm · 1.12mm/px · z∈[-881,-49]mm · 5 of 209 slices shown]
[im 1/209]
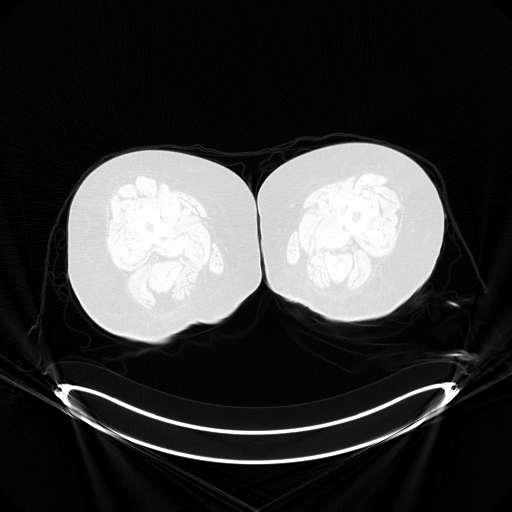
[im 53/209]
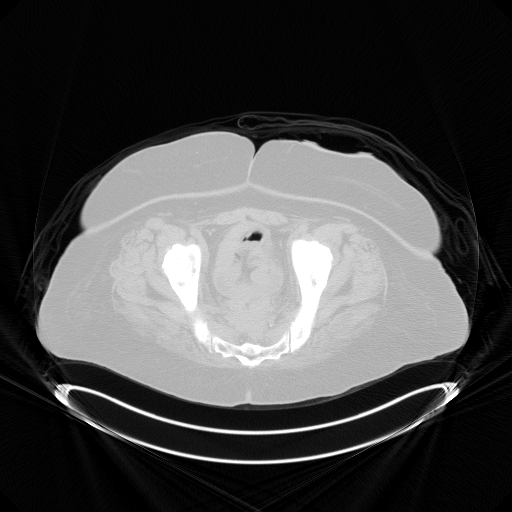
[im 105/209]
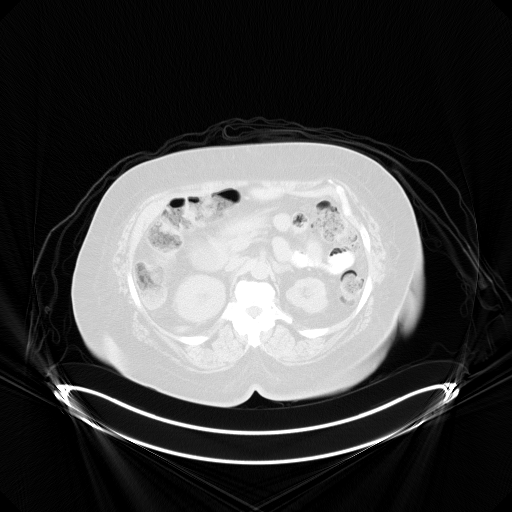
[im 157/209]
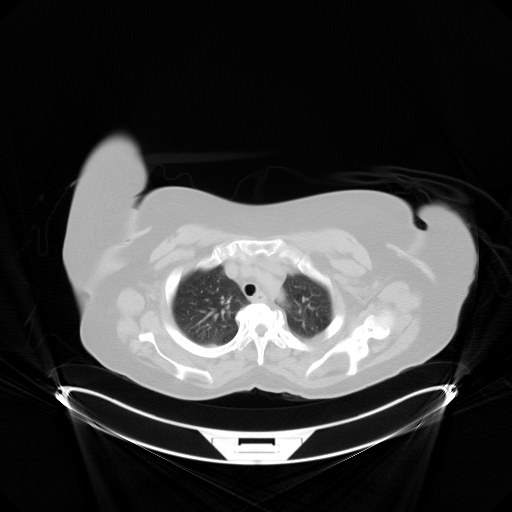
[im 209/209]
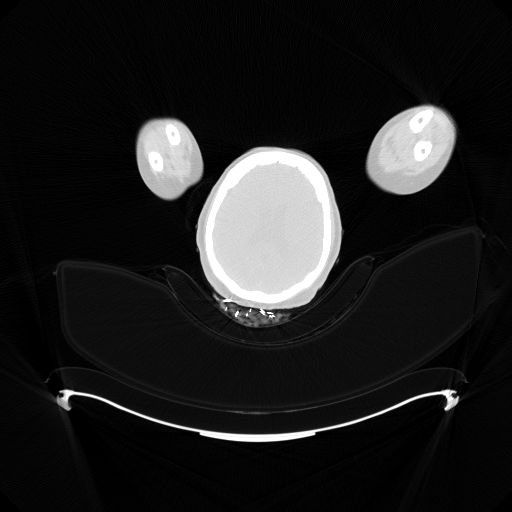

[Series 7: pet sk_thigh nac · axial · 5.0mm · 4.07mm/px · z∈[-881,-49]mm · 5 of 209 slices shown]
[im 1/209]
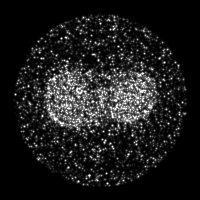
[im 53/209]
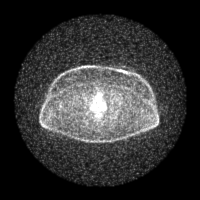
[im 105/209]
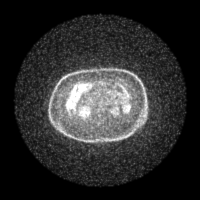
[im 157/209]
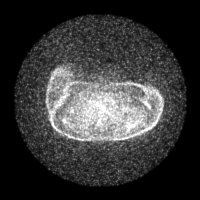
[im 209/209]
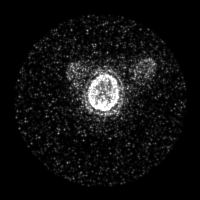

[Series 8: ct sk_thigh 5.0 b70f (id)_bone · axial · 5.0mm · 0.61mm/px · z∈[-471,-195]mm · 2 of 70 slices shown]
[im 1/70  bone]
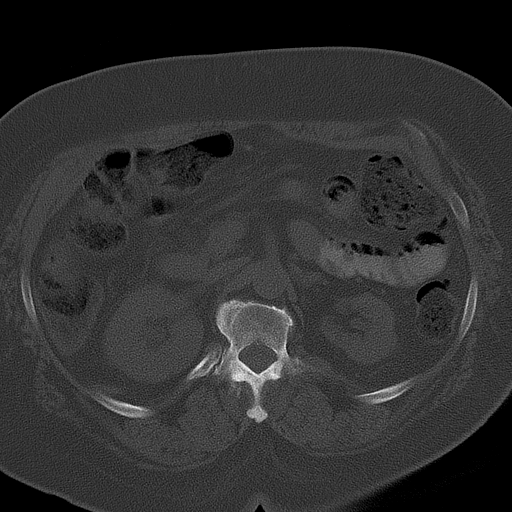
[im 70/70  bone]
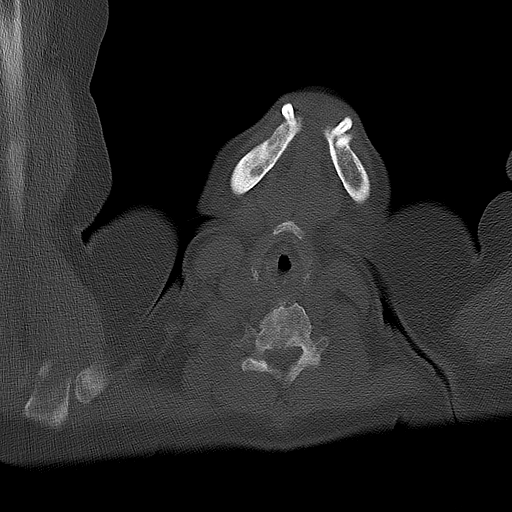

[Series 604: mip collection<mip range> · coronal · 1.73mm/px · 1 of 32 slices shown]
[im 1/32]
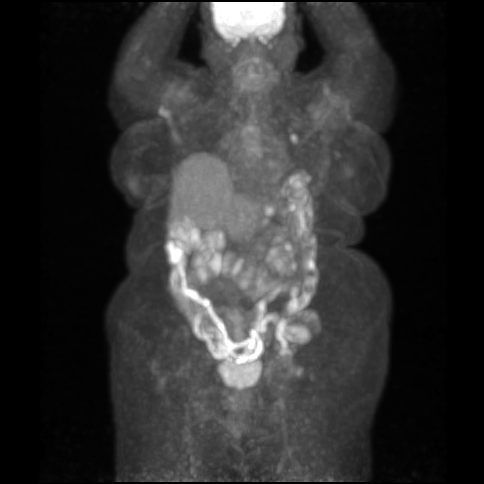

[Series 605: range-ct sk_thigh 5.0 hd_fov-cor-<alpha range> · 2 of 100 slices shown]
[im 1/100]
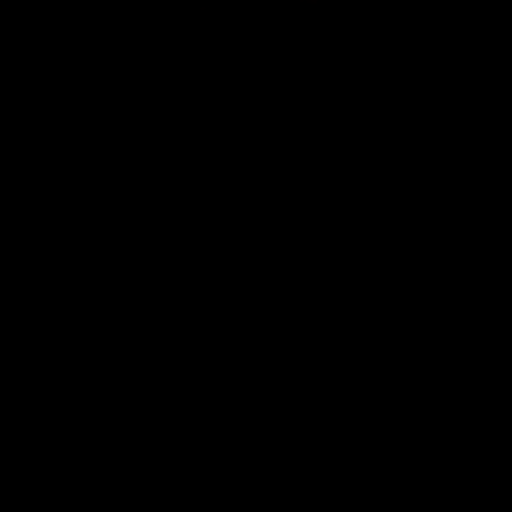
[im 100/100]
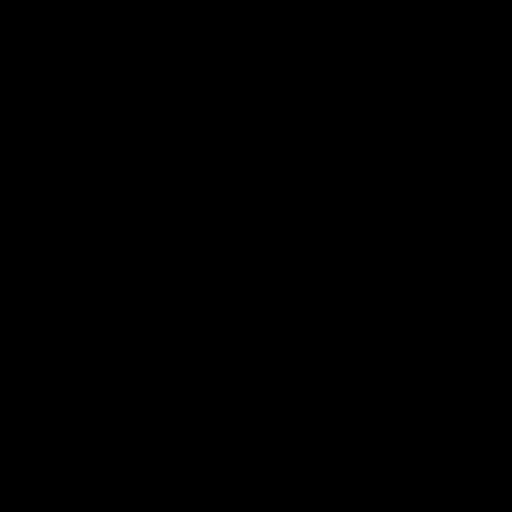

[Series 606: range-ct sk_thigh 5.0 hd_fov-tra-<alpha range> · 5 of 204 slices shown]
[im 1/204]
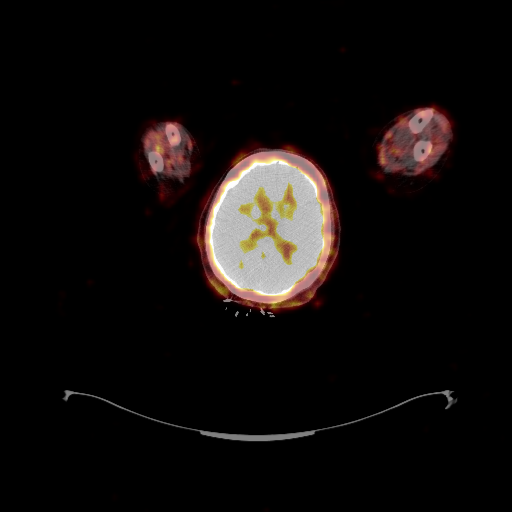
[im 51/204]
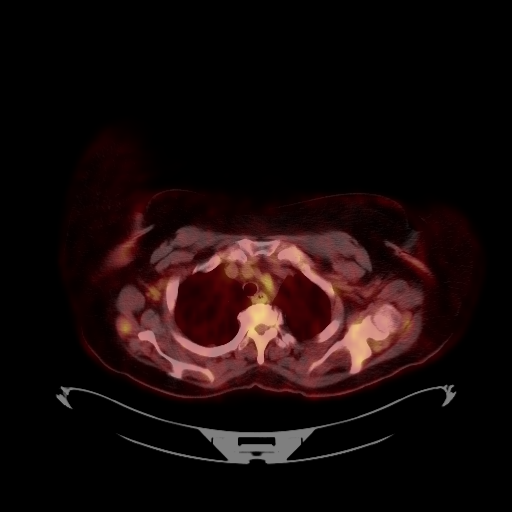
[im 102/204]
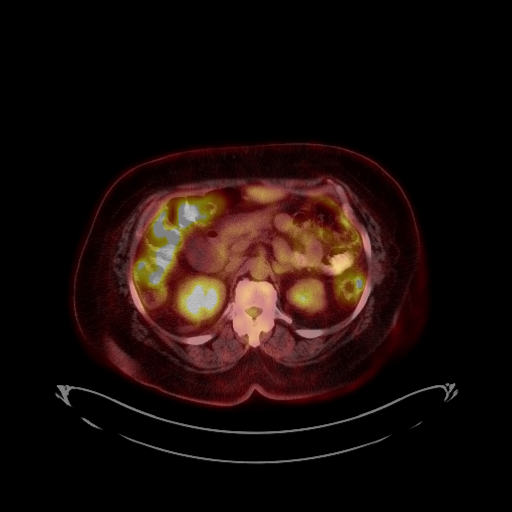
[im 153/204]
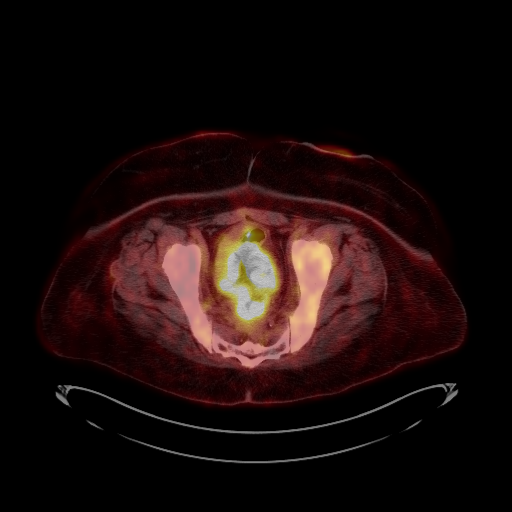
[im 204/204]
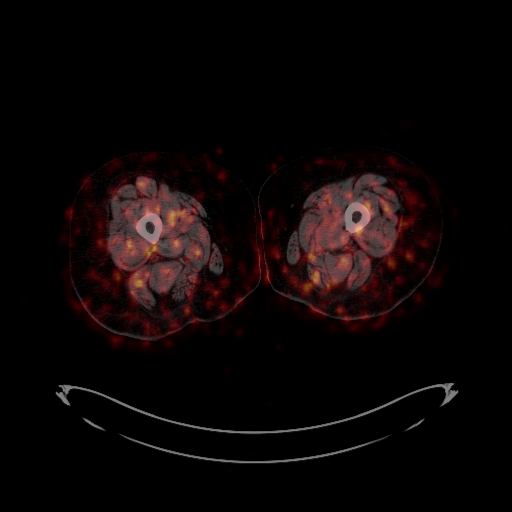

[Series 1094: results mm oncology reading · 1.11mm/px · 1 of 6 slices shown]
[im 1/6]
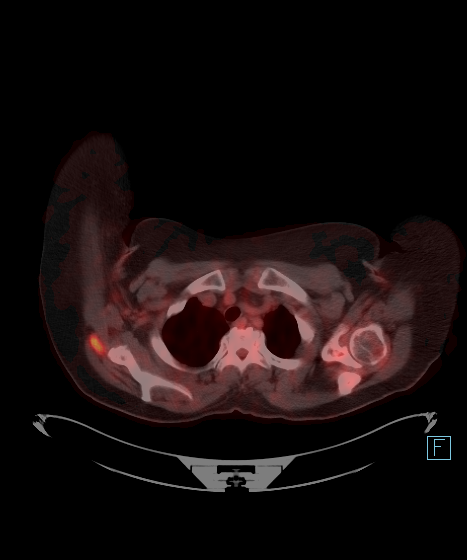

[25 of 25 positions shown; findings below may reference images not displayed]

FINDINGS: NECK

No areas of abnormal hypermetabolism.

CHEST

Hypermetabolism which corresponds to the superior segment left lower
lobe pulmonary nodule. This measures 12 x 11 mm and a S.U.V. max of
7.9, including on image 23 of series 8. This is enlarged when
compared to remote prior exams.

A left axillary node measures 7 mm and a S.U.V. max of 1.7. Not
pathologic by size criteria and non malignant range activity.
Similar size nodes in this area on 03/05/2013.

ABDOMEN/PELVIS

No areas of abnormal hypermetabolism.

SKELETON

Heterogeneous marrow activity is identified throughout. This
corresponds to chronic heterogeneous marrow density with areas of
sclerosis and lucency. Somewhat more focal area of hypermetabolism
involving the left sacrum is without focal CT correlate and measures
a S.U.V. max of 5.8.

CT IMAGES PERFORMED FOR ATTENUATION CORRECTION

No cervical adenopathy. Right lower lobe pulmonary nodule which is
below the resolution of PET. No lobar consolidation. Bilateral
adrenal thickening. Left renal atrophy. Motion degradation within
the abdomen and pelvis especially. Descending colostomy with mild
redundancy within. Heterogeneous marrow density which has been
present back to 9060. Areas of cortical thickening, most apparent
within the pelvis.
IMPRESSION: 1. Hypermetabolism corresponding to the superior segment left lower
lobe lung nodule. This could represent a primary bronchogenic
carcinoma or isolated metastatic lesion. Primary bronchogenic
carcinomas slightly favored.
2. No evidence of hypermetabolic metastatic disease.
3. Left axillary node is small and demonstrates low-level non
malignant range hypermetabolism. Favor reactive etiology.
4. Heterogeneous marrow density and metabolism. The CT appearance
has been present back to 9060. Favor Paget's disease. An area of
more focal hypermetabolism in the left side of the sacrum is
nonspecific and without focal CT correlate. Metastatic disease is
felt unlikely but cannot be entirely excluded. Recommend attention
on follow-up.

## 2015-08-18 ENCOUNTER — Encounter (HOSPITAL_COMMUNITY): Payer: Medicare Other | Attending: Hematology & Oncology

## 2015-08-18 ENCOUNTER — Encounter (HOSPITAL_COMMUNITY): Payer: Medicare Other

## 2015-08-18 DIAGNOSIS — K219 Gastro-esophageal reflux disease without esophagitis: Secondary | ICD-10-CM | POA: Insufficient documentation

## 2015-08-18 DIAGNOSIS — D631 Anemia in chronic kidney disease: Secondary | ICD-10-CM | POA: Diagnosis present

## 2015-08-18 DIAGNOSIS — M889 Osteitis deformans of unspecified bone: Secondary | ICD-10-CM | POA: Diagnosis not present

## 2015-08-18 DIAGNOSIS — N183 Chronic kidney disease, stage 3 (moderate): Secondary | ICD-10-CM | POA: Diagnosis not present

## 2015-08-18 DIAGNOSIS — M1990 Unspecified osteoarthritis, unspecified site: Secondary | ICD-10-CM | POA: Insufficient documentation

## 2015-08-18 DIAGNOSIS — E538 Deficiency of other specified B group vitamins: Secondary | ICD-10-CM

## 2015-08-18 DIAGNOSIS — C7800 Secondary malignant neoplasm of unspecified lung: Secondary | ICD-10-CM | POA: Diagnosis not present

## 2015-08-18 DIAGNOSIS — F329 Major depressive disorder, single episode, unspecified: Secondary | ICD-10-CM | POA: Insufficient documentation

## 2015-08-18 DIAGNOSIS — M109 Gout, unspecified: Secondary | ICD-10-CM | POA: Insufficient documentation

## 2015-08-18 DIAGNOSIS — N189 Chronic kidney disease, unspecified: Principal | ICD-10-CM

## 2015-08-18 DIAGNOSIS — R569 Unspecified convulsions: Secondary | ICD-10-CM | POA: Insufficient documentation

## 2015-08-18 DIAGNOSIS — C19 Malignant neoplasm of rectosigmoid junction: Secondary | ICD-10-CM | POA: Insufficient documentation

## 2015-08-18 DIAGNOSIS — E119 Type 2 diabetes mellitus without complications: Secondary | ICD-10-CM | POA: Insufficient documentation

## 2015-08-18 DIAGNOSIS — F79 Unspecified intellectual disabilities: Secondary | ICD-10-CM | POA: Diagnosis not present

## 2015-08-18 DIAGNOSIS — I129 Hypertensive chronic kidney disease with stage 1 through stage 4 chronic kidney disease, or unspecified chronic kidney disease: Secondary | ICD-10-CM | POA: Insufficient documentation

## 2015-08-18 DIAGNOSIS — Z923 Personal history of irradiation: Secondary | ICD-10-CM | POA: Insufficient documentation

## 2015-08-18 DIAGNOSIS — Z85048 Personal history of other malignant neoplasm of rectum, rectosigmoid junction, and anus: Secondary | ICD-10-CM | POA: Diagnosis not present

## 2015-08-18 DIAGNOSIS — Z9889 Other specified postprocedural states: Secondary | ICD-10-CM | POA: Insufficient documentation

## 2015-08-18 DIAGNOSIS — Z9221 Personal history of antineoplastic chemotherapy: Secondary | ICD-10-CM | POA: Insufficient documentation

## 2015-08-18 LAB — CBC
HCT: 35.5 % — ABNORMAL LOW (ref 36.0–46.0)
Hemoglobin: 11 g/dL — ABNORMAL LOW (ref 12.0–15.0)
MCH: 29 pg (ref 26.0–34.0)
MCHC: 31 g/dL (ref 30.0–36.0)
MCV: 93.7 fL (ref 78.0–100.0)
PLATELETS: 159 10*3/uL (ref 150–400)
RBC: 3.79 MIL/uL — ABNORMAL LOW (ref 3.87–5.11)
RDW: 15.7 % — AB (ref 11.5–15.5)
WBC: 5.2 10*3/uL (ref 4.0–10.5)

## 2015-08-18 MED ORDER — DARBEPOETIN ALFA 100 MCG/0.5ML IJ SOSY
PREFILLED_SYRINGE | INTRAMUSCULAR | Status: AC
  Filled 2015-08-18: qty 0.5

## 2015-08-18 MED ORDER — DARBEPOETIN ALFA 100 MCG/0.5ML IJ SOSY: 100.0000 ug | PREFILLED_SYRINGE | Freq: Once | INTRAMUSCULAR | Status: DC

## 2015-08-18 NOTE — Progress Notes (Signed)
Hemoglobin 11, no injection needed per Dr Mamie Nick

## 2015-08-18 NOTE — Patient Instructions (Signed)
[place] at [facility] Discharge Instructions  RECOMMENDATIONS MADE BY THE CONSULTANT AND ANY TEST RESULTS WILL BE SENT TO YOUR REFERRING PHYSICIAN.   Hemoglobin 11 No injection needed today Follow up as scheduled   Thank you for choosing [place] at [facility] to provide your oncology and hematology care.  To afford each patient quality time with our provider, please arrive at least 15 minutes before your scheduled appointment time.   Beginning January 23rd 2017 lab work for the [facility] will be done in the  Main lab at [facility] on 1st floor. If you have a lab appointment with the [place] please come in thru the  Main Entrance and check in at the main information desk  You need to re-schedule your appointment should you arrive 10 or more minutes late.  We strive to give you quality time with our providers, and arriving late affects you and other patients whose appointments are after yours.  Also, if you no show three or more times for appointments you may be dismissed from the clinic at the providers discretion.     Again, thank you for choosing [facility].  Our hope is that these requests will decrease the amount of time that you wait before being seen by our physicians.       _____________________________________________________________  Should you have questions after your visit to [facility], please contact our office at [phone] between the hours of 8:30 a.m. and 4:30 p.m.  Voicemails left after 4:30 p.m. will not be returned until the following business day.  For prescription refill requests, have your pharmacy contact our office.         Resources For Cancer Patients and their Caregivers ? American Cancer Society: Can assist with transportation, wigs, general needs, runs Look Good Feel Better.        [phone] ? Cancer Care: Provides financial assistance, online support  groups, medication/co-pay assistance.  1-800-813-HOPE [phone]) ? [place] Assists [place] cancer patients and their families through emotional , educational and financial support.  351-259-2762 ? [place] DSS Where to apply for food stamps, Medicaid and utility assistance. [phone] ? RCATS: Transportation to medical appointments. 854-765-0388 ? Social Security Administration: May apply for disability if have a Stage IV cancer. 847-672-6541 [phone] ? [employer], Disability and Transit Services: Assists with nutrition, care and transit needs. [place] Support Programs: '@10RELATIVEDAYS'$ @ > Cancer Support Group  2nd Tuesday of the month 1pm-2pm, Journey Room  > Creative Journey  3rd Tuesday of the month 1130am-1pm, Journey Room  > Look Good Feel Better  1st Wednesday of the month 10am-12 noon, Journey Room (Call [place] to register [phone])

## 2015-08-25 ENCOUNTER — Encounter (HOSPITAL_COMMUNITY): Payer: Medicare Other

## 2015-08-29 ENCOUNTER — Ambulatory Visit (HOSPITAL_COMMUNITY)
Admission: RE | Admit: 2015-08-29 | Discharge: 2015-08-29 | Disposition: A | Discharge status: Home / Self Care | Payer: Medicare Other | Source: Ambulatory Visit | Attending: Hematology & Oncology | Admitting: Hematology & Oncology

## 2015-08-29 DIAGNOSIS — Z9889 Other specified postprocedural states: Secondary | ICD-10-CM | POA: Insufficient documentation

## 2015-08-29 DIAGNOSIS — C20 Malignant neoplasm of rectum: Secondary | ICD-10-CM | POA: Diagnosis not present

## 2015-08-29 DIAGNOSIS — Z933 Colostomy status: Secondary | ICD-10-CM | POA: Insufficient documentation

## 2015-08-29 DIAGNOSIS — K435 Parastomal hernia without obstruction or  gangrene: Secondary | ICD-10-CM | POA: Diagnosis not present

## 2015-08-29 DIAGNOSIS — C7800 Secondary malignant neoplasm of unspecified lung: Secondary | ICD-10-CM | POA: Insufficient documentation

## 2015-08-29 DIAGNOSIS — R911 Solitary pulmonary nodule: Secondary | ICD-10-CM | POA: Insufficient documentation

## 2015-08-29 MED ORDER — IOPAMIDOL (ISOVUE-300) INJECTION 61%
100.0000 mL | Freq: Once | INTRAVENOUS | Status: AC | PRN
  Administered 2015-08-29: 100 mL via INTRAVENOUS

## 2015-09-01 ENCOUNTER — Encounter (HOSPITAL_COMMUNITY): Payer: Self-pay

## 2015-09-01 ENCOUNTER — Encounter (HOSPITAL_COMMUNITY): Payer: Medicare Other | Admitting: Oncology

## 2015-09-01 ENCOUNTER — Encounter (HOSPITAL_COMMUNITY): Payer: Medicare Other

## 2015-09-01 ENCOUNTER — Other Ambulatory Visit (HOSPITAL_COMMUNITY): Payer: Self-pay | Admitting: Oncology

## 2015-09-01 ENCOUNTER — Encounter (HOSPITAL_BASED_OUTPATIENT_CLINIC_OR_DEPARTMENT_OTHER): Payer: Medicare Other

## 2015-09-01 DIAGNOSIS — D631 Anemia in chronic kidney disease: Secondary | ICD-10-CM

## 2015-09-01 DIAGNOSIS — N189 Chronic kidney disease, unspecified: Secondary | ICD-10-CM

## 2015-09-01 DIAGNOSIS — E538 Deficiency of other specified B group vitamins: Secondary | ICD-10-CM

## 2015-09-01 LAB — CBC
HCT: 33.6 % — ABNORMAL LOW (ref 36.0–46.0)
Hemoglobin: 10.7 g/dL — ABNORMAL LOW (ref 12.0–15.0)
MCH: 29 pg (ref 26.0–34.0)
MCHC: 31.8 g/dL (ref 30.0–36.0)
MCV: 91.1 fL (ref 78.0–100.0)
PLATELETS: 171 10*3/uL (ref 150–400)
RBC: 3.69 MIL/uL — AB (ref 3.87–5.11)
RDW: 15.3 % (ref 11.5–15.5)
WBC: 4.3 10*3/uL (ref 4.0–10.5)

## 2015-09-01 MED ORDER — HEPARIN SOD (PORK) LOCK FLUSH 100 UNIT/ML IV SOLN
500.0000 [IU] | Freq: Once | INTRAVENOUS | Status: AC
  Administered 2015-09-01: 500 [IU] via INTRAVENOUS

## 2015-09-01 MED ORDER — HEPARIN SOD (PORK) LOCK FLUSH 100 UNIT/ML IV SOLN
INTRAVENOUS | Status: AC
  Filled 2015-09-01: qty 5

## 2015-09-01 MED ORDER — DARBEPOETIN ALFA 100 MCG/0.5ML IJ SOSY
PREFILLED_SYRINGE | INTRAMUSCULAR | Status: AC
  Filled 2015-09-01: qty 0.5

## 2015-09-01 MED ORDER — CYANOCOBALAMIN 1000 MCG/ML IJ SOLN
1000.0000 ug | Freq: Once | INTRAMUSCULAR | Status: AC
  Administered 2015-09-01: 1000 ug via INTRAMUSCULAR

## 2015-09-01 MED ORDER — SODIUM CHLORIDE 0.9% FLUSH
10.0000 mL | INTRAVENOUS | Status: DC | PRN
  Administered 2015-09-01: 10 mL via INTRAVENOUS
  Filled 2015-09-01: qty 10

## 2015-09-01 MED ORDER — DARBEPOETIN ALFA 100 MCG/0.5ML IJ SOSY
100.0000 ug | PREFILLED_SYRINGE | Freq: Once | INTRAMUSCULAR | Status: AC
  Administered 2015-09-01: 100 ug via SUBCUTANEOUS

## 2015-09-01 MED ORDER — CYANOCOBALAMIN 1000 MCG/ML IJ SOLN
INTRAMUSCULAR | Status: AC
  Filled 2015-09-01: qty 1

## 2015-09-01 NOTE — Progress Notes (Signed)
Please see aranesp encounter for more information

## 2015-09-01 NOTE — Progress Notes (Signed)
[name] presented for Portacath access and flush.  Portacath located left chest wall accessed with  H 20 needle.  Good blood return present. Portacath flushed with 58m NS and 500U/590mHeparin and needle removed intact.  Procedure tolerated well and without incident.    Jennifer Santinoresents today for injection per the provider's orders.  B12 and Aranesp administration without incident; see MAR for injection details.  Patient tolerated procedure well and without incident.  No questions or complaints noted at this time.

## 2015-09-01 NOTE — Patient Instructions (Signed)
[place] at [facility] Discharge Instructions  RECOMMENDATIONS MADE BY THE CONSULTANT AND ANY TEST RESULTS WILL BE SENT TO YOUR REFERRING PHYSICIAN.  Port flush today. B12 injection today. Aranesp injection today. Return as scheduled for lab work and injections. Return as scheduled for office visit.  Thank you for choosing [place] at [facility] to provide your oncology and hematology care.  To afford each patient quality time with our provider, please arrive at least 15 minutes before your scheduled appointment time.   Beginning January 23rd 2017 lab work for the [facility] will be done in the  Main lab at [facility] on 1st floor. If you have a lab appointment with the [place] please come in thru the  Main Entrance and check in at the main information desk  You need to re-schedule your appointment should you arrive 10 or more minutes late.  We strive to give you quality time with our providers, and arriving late affects you and other patients whose appointments are after yours.  Also, if you no show three or more times for appointments you may be dismissed from the clinic at the providers discretion.     Again, thank you for choosing [facility].  Our hope is that these requests will decrease the amount of time that you wait before being seen by our physicians.       _____________________________________________________________  Should you have questions after your visit to [facility], please contact our office at [phone] between the hours of 8:30 a.m. and 4:30 p.m.  Voicemails left after 4:30 p.m. will not be returned until the following business day.  For prescription refill requests, have your pharmacy contact our office.         Resources For Cancer Patients and their Caregivers ? American Cancer Society: Can assist with transportation, wigs, general needs, runs Look Good Feel Better.         989-439-3722 ? Cancer Care: Provides financial assistance, online support groups, medication/co-pay assistance.  1-800-813-HOPE 631-551-2735) ? [place] Assists [place] cancer patients and their families through emotional , educational and financial support.  418-078-2629 ? [place] DSS Where to apply for food stamps, Medicaid and utility assistance. 4312709955 ? RCATS: Transportation to medical appointments. 5011848149 ? Social Security Administration: May apply for disability if have a Stage IV cancer. 214-408-2053 [phone] ? [employer], Disability and Transit Services: Assists with nutrition, care and transit needs. [place] Support Programs: '@10RELATIVEDAYS'$ @ > Cancer Support Group  2nd Tuesday of the month 1pm-2pm, Journey Room  > Creative Journey  3rd Tuesday of the month 1130am-1pm, Journey Room  > Look Good Feel Better  1st Wednesday of the month 10am-12 noon, Journey Room (Call [place] to register [phone])

## 2015-09-01 NOTE — Progress Notes (Signed)
-  No show-  ROS

## 2015-09-12 ENCOUNTER — Ambulatory Visit: Payer: Medicare Other | Admitting: Podiatry

## 2015-09-14 ENCOUNTER — Ambulatory Visit (INDEPENDENT_AMBULATORY_CARE_PROVIDER_SITE_OTHER): Payer: Medicare Other | Admitting: Podiatry

## 2015-09-14 ENCOUNTER — Encounter: Payer: Self-pay | Admitting: Podiatry

## 2015-09-14 DIAGNOSIS — R238 Other skin changes: Secondary | ICD-10-CM

## 2015-09-14 DIAGNOSIS — B351 Tinea unguium: Secondary | ICD-10-CM

## 2015-09-14 DIAGNOSIS — L988 Other specified disorders of the skin and subcutaneous tissue: Secondary | ICD-10-CM

## 2015-09-14 DIAGNOSIS — L03031 Cellulitis of right toe: Secondary | ICD-10-CM | POA: Diagnosis not present

## 2015-09-14 DIAGNOSIS — M79674 Pain in right toe(s): Secondary | ICD-10-CM

## 2015-09-14 DIAGNOSIS — M79675 Pain in left toe(s): Secondary | ICD-10-CM

## 2015-09-14 MED ORDER — CEPHALEXIN 500 MG PO CAPS: 500.0000 mg | ORAL_CAPSULE | Freq: Two times a day (BID) | ORAL | Status: DC

## 2015-09-14 NOTE — Progress Notes (Signed)
   Subjective:    Patient ID: [name], female    DOB: 1950/08/08, 65 y.o.   MRN: [id number]  HPI  65 year old female presents the also concerns of thick, painful, elongated toenails that she cannot trim herself. Her caregiver also states this is blister on her right foot between her third and fourth toes. No pain to the area. Denies any drainage or pus. Unsure how long this has been ongoing for period she denies any systemic complaints as fevers, chills, nausea, vomiting. No calf pain, chest pain, shortness of breath.    Review of Systems  All other systems reviewed and are negative.      Objective:   Physical Exam General: AAO x3, NAD  Dermatological: Nails are hypertrophic, dystrophic, brittle, discolored, elongated 10. No surrounding redness or drainage. Tenderness nails 1-5 bilaterally. In the right third interspace between the third and fourth toes of the drain bulla some evidence of some serous drainage expressed. No pus. There is edema and erythema to the third and fourth toes. There is no ascending synovitis. There is no further fluctuance or crepitus. There is no malodor. No other open lesions or pre-ulcerative lesions identified at this time.   Vascular: DP pulses 2/4, PT pulse 1/4. Chronic edema bilaterally. There is no pain with calf compression, swelling, warmth, erythema.   Neruologic: sensation decreased with Derrel Nip monofilament  Musculoskeletal:  No pain, crepitus, or limitation noted with foot and ankle range of motion bilateral. Muscular strength 5/5 in all groups tested bilateral.  Gait: Unassisted, Nonantalgic.      Assessment & Plan:  Patient presented for symptomatic onychomycosis with bulla and localized infection  -Treatment options discussed including all alternatives, risks, and complications -Etiology of symptoms were discussed -Nails debrided 10 without couple complications or bleeding  -Will start Keflex due to cellulitis. Monitor for  signs or symptoms of worsening infection. The articular occur. Small amount of Betadine interdigitally for now.  -Follow-up in 10 days or sooner if any issues are to arise.   Celesta Gentile, DPM

## 2015-09-15 ENCOUNTER — Encounter (HOSPITAL_COMMUNITY): Payer: Medicare Other

## 2015-09-15 ENCOUNTER — Telehealth (HOSPITAL_COMMUNITY): Payer: Self-pay | Admitting: *Deleted

## 2015-09-15 ENCOUNTER — Encounter (HOSPITAL_BASED_OUTPATIENT_CLINIC_OR_DEPARTMENT_OTHER): Payer: Medicare Other

## 2015-09-15 VITALS — BP 152/59 | HR 73 | Temp 98.0°F | Resp 20

## 2015-09-15 DIAGNOSIS — N189 Chronic kidney disease, unspecified: Secondary | ICD-10-CM

## 2015-09-15 DIAGNOSIS — D631 Anemia in chronic kidney disease: Secondary | ICD-10-CM | POA: Diagnosis not present

## 2015-09-15 DIAGNOSIS — E538 Deficiency of other specified B group vitamins: Secondary | ICD-10-CM

## 2015-09-15 LAB — CBC
HEMATOCRIT: 34.8 % — AB (ref 36.0–46.0)
HEMOGLOBIN: 10.9 g/dL — AB (ref 12.0–15.0)
MCH: 28.3 pg (ref 26.0–34.0)
MCHC: 31.3 g/dL (ref 30.0–36.0)
MCV: 90.4 fL (ref 78.0–100.0)
Platelets: 151 10*3/uL (ref 150–400)
RBC: 3.85 MIL/uL — AB (ref 3.87–5.11)
RDW: 15.3 % (ref 11.5–15.5)
WBC: 5.3 10*3/uL (ref 4.0–10.5)

## 2015-09-15 MED ORDER — DARBEPOETIN ALFA 100 MCG/0.5ML IJ SOSY
100.0000 ug | PREFILLED_SYRINGE | Freq: Once | INTRAMUSCULAR | Status: AC
  Administered 2015-09-15: 100 ug via SUBCUTANEOUS

## 2015-09-15 MED ORDER — DARBEPOETIN ALFA 100 MCG/0.5ML IJ SOSY
PREFILLED_SYRINGE | INTRAMUSCULAR | Status: AC
  Filled 2015-09-15: qty 0.5

## 2015-09-15 NOTE — Patient Instructions (Signed)
[place] at [facility] Discharge Instructions  RECOMMENDATIONS MADE BY THE CONSULTANT AND ANY TEST RESULTS WILL BE SENT TO YOUR REFERRING PHYSICIAN.  Aranesp given today  Follow up as scheduled  Thank you for choosing [place] at [facility] to provide your oncology and hematology care.  To afford each patient quality time with our provider, please arrive at least 15 minutes before your scheduled appointment time.   Beginning January 23rd 2017 lab work for the [facility] will be done in the  Main lab at [facility] on 1st floor. If you have a lab appointment with the [place] please come in thru the  Main Entrance and check in at the main information desk  You need to re-schedule your appointment should you arrive 10 or more minutes late.  We strive to give you quality time with our providers, and arriving late affects you and other patients whose appointments are after yours.  Also, if you no show three or more times for appointments you may be dismissed from the clinic at the providers discretion.     Again, thank you for choosing [facility].  Our hope is that these requests will decrease the amount of time that you wait before being seen by our physicians.       _____________________________________________________________  Should you have questions after your visit to [facility], please contact our office at [phone] between the hours of 8:30 a.m. and 4:30 p.m.  Voicemails left after 4:30 p.m. will not be returned until the following business day.  For prescription refill requests, have your pharmacy contact our office.         Resources For Cancer Patients and their Caregivers ? American Cancer Society: Can assist with transportation, wigs, general needs, runs Look Good Feel Better.        [phone] ? Cancer Care: Provides financial assistance, online support groups, medication/co-pay  assistance.  1-800-813-HOPE (812)286-3568) ? [place] Assists [place] cancer patients and their families through emotional , educational and financial support.  814-457-1648 ? [place] DSS Where to apply for food stamps, Medicaid and utility assistance. 7263494326 ? RCATS: Transportation to medical appointments. 609-843-9100 ? Social Security Administration: May apply for disability if have a Stage IV cancer. 5130044523 318-094-4795 ? [employer], Disability and Transit Services: Assists with nutrition, care and transit needs. [place] Support Programs: '@10RELATIVEDAYS'$ @ > Cancer Support Group  2nd Tuesday of the month 1pm-2pm, Journey Room  > Creative Journey  3rd Tuesday of the month 1130am-1pm, Journey Room  > Look Good Feel Better  1st Wednesday of the month 10am-12 noon, Journey Room (Call [place] to register [phone])

## 2015-09-15 NOTE — Progress Notes (Signed)
[name] presents today for injection per MD orders. Aranesp 147mg administered SQ in left Abdomen. Administration without incident. Patient tolerated well.

## 2015-09-28 ENCOUNTER — Encounter: Payer: Self-pay | Admitting: Podiatry

## 2015-09-28 ENCOUNTER — Ambulatory Visit (INDEPENDENT_AMBULATORY_CARE_PROVIDER_SITE_OTHER): Payer: Medicare Other | Admitting: Podiatry

## 2015-09-28 VITALS — BP 148/74 | HR 78 | Resp 12

## 2015-09-28 DIAGNOSIS — L03031 Cellulitis of right toe: Secondary | ICD-10-CM

## 2015-09-29 ENCOUNTER — Encounter (HOSPITAL_BASED_OUTPATIENT_CLINIC_OR_DEPARTMENT_OTHER): Payer: Medicare Other

## 2015-09-29 ENCOUNTER — Encounter (HOSPITAL_COMMUNITY): Payer: Self-pay | Admitting: Oncology

## 2015-09-29 ENCOUNTER — Encounter (HOSPITAL_COMMUNITY): Payer: Medicare Other

## 2015-09-29 ENCOUNTER — Encounter (HOSPITAL_COMMUNITY): Payer: Medicare Other | Attending: Hematology and Oncology | Admitting: Oncology

## 2015-09-29 VITALS — BP 132/48 | HR 71 | Temp 98.3°F | Resp 18 | Wt 224.0 lb

## 2015-09-29 DIAGNOSIS — D631 Anemia in chronic kidney disease: Secondary | ICD-10-CM | POA: Diagnosis not present

## 2015-09-29 DIAGNOSIS — C20 Malignant neoplasm of rectum: Secondary | ICD-10-CM | POA: Diagnosis not present

## 2015-09-29 DIAGNOSIS — N183 Chronic kidney disease, stage 3 (moderate): Secondary | ICD-10-CM

## 2015-09-29 DIAGNOSIS — D649 Anemia, unspecified: Secondary | ICD-10-CM | POA: Diagnosis present

## 2015-09-29 DIAGNOSIS — D538 Other specified nutritional anemias: Secondary | ICD-10-CM

## 2015-09-29 DIAGNOSIS — E538 Deficiency of other specified B group vitamins: Secondary | ICD-10-CM

## 2015-09-29 DIAGNOSIS — R911 Solitary pulmonary nodule: Secondary | ICD-10-CM

## 2015-09-29 DIAGNOSIS — N189 Chronic kidney disease, unspecified: Principal | ICD-10-CM

## 2015-09-29 DIAGNOSIS — C7800 Secondary malignant neoplasm of unspecified lung: Secondary | ICD-10-CM

## 2015-09-29 LAB — CBC
HCT: 34.8 % — ABNORMAL LOW (ref 36.0–46.0)
HEMOGLOBIN: 10.9 g/dL — AB (ref 12.0–15.0)
MCH: 27.9 pg (ref 26.0–34.0)
MCHC: 31.3 g/dL (ref 30.0–36.0)
MCV: 89.2 fL (ref 78.0–100.0)
PLATELETS: 150 10*3/uL (ref 150–400)
RBC: 3.9 MIL/uL (ref 3.87–5.11)
RDW: 15.3 % (ref 11.5–15.5)
WBC: 4.7 10*3/uL (ref 4.0–10.5)

## 2015-09-29 MED ORDER — DARBEPOETIN ALFA 100 MCG/0.5ML IJ SOSY
100.0000 ug | PREFILLED_SYRINGE | Freq: Once | INTRAMUSCULAR | Status: AC
  Administered 2015-09-29: 100 ug via SUBCUTANEOUS
  Filled 2015-09-29: qty 0.5

## 2015-09-29 MED ORDER — CYANOCOBALAMIN 1000 MCG/ML IJ SOLN
1000.0000 ug | Freq: Once | INTRAMUSCULAR | Status: AC
  Administered 2015-09-29: 1000 ug via INTRAMUSCULAR
  Filled 2015-09-29: qty 1

## 2015-09-29 NOTE — Progress Notes (Signed)
[name] presents today for injection per MD orders. Aranesp 110mg administered SQ in right Abdomen. Administration without incident. Patient tolerated well.   Jennifer Headingspresents today for injection per MD orders. B12 1,000 mcg administered IM in right Upper Arm. Administration without incident. Patient tolerated well.

## 2015-09-29 NOTE — Patient Instructions (Signed)
[place] at [facility] Discharge Instructions  RECOMMENDATIONS MADE BY THE CONSULTANT AND ANY TEST RESULTS WILL BE SENT TO YOUR REFERRING PHYSICIAN.  You saw Dr. Gershon Mussel today. You had 2 injections today- Aranesp and B12 No further Aranesp injections after today. B12 injection monthly. Labs Monthly. CT chest in 3 months Return to clinic in 3 months for follow up after CT scan.   Thank you for choosing [place] at [facility] to provide your oncology and hematology care.  To afford each patient quality time with our provider, please arrive at least 15 minutes before your scheduled appointment time.   Beginning January 23rd 2017 lab work for the [facility] will be done in the  Main lab at [facility] on 1st floor. If you have a lab appointment with the [place] please come in thru the  Main Entrance and check in at the main information desk  You need to re-schedule your appointment should you arrive 10 or more minutes late.  We strive to give you quality time with our providers, and arriving late affects you and other patients whose appointments are after yours.  Also, if you no show three or more times for appointments you may be dismissed from the clinic at the providers discretion.     Again, thank you for choosing [facility].  Our hope is that these requests will decrease the amount of time that you wait before being seen by our physicians.       _____________________________________________________________  Should you have questions after your visit to [facility], please contact our office at [phone] between the hours of 8:30 a.m. and 4:30 p.m.  Voicemails left after 4:30 p.m. will not be returned until the following business day.  For prescription refill requests, have your pharmacy contact our office.         Resources For Cancer Patients and their Caregivers ? American Cancer Society: Can  assist with transportation, wigs, general needs, runs Look Good Feel Better.        670-014-1357 ? Cancer Care: Provides financial assistance, online support groups, medication/co-pay assistance.  1-800-813-HOPE 807-711-3734) ? [place] Assists [place] cancer patients and their families through emotional , educational and financial support.  210-091-4109 ? [place] DSS Where to apply for food stamps, Medicaid and utility assistance. [phone] ? RCATS: Transportation to medical appointments. 365-121-8033 ? Social Security Administration: May apply for disability if have a Stage IV cancer. 909-198-4619 [phone] ? [employer], Disability and Transit Services: Assists with nutrition, care and transit needs. [place] Support Programs: '@10RELATIVEDAYS'$ @ > Cancer Support Group  2nd Tuesday of the month 1pm-2pm, Journey Room  > Creative Journey  3rd Tuesday of the month 1130am-1pm, Journey Room  > Look Good Feel Better  1st Wednesday of the month 10am-12 noon, Journey Room (Call [place] to register [phone])

## 2015-09-29 NOTE — Assessment & Plan Note (Addendum)
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 Roxan Hockey on 10/26/2014 demonstrating recurrent Stage IV disease after undergoing Xeloda+XRT finishing on 05/04/2013, followed by APR on 07/26/2013 by Dr. Arnoldo Morale with marked cytoreduction documented, followed by 6 months worth of Xeloda 1000 mg BID 2 weeks on and 1 week off x 6 months.  Surveillance per NCCN guidelines was followed with serial CT imaging of chest demonstrating an enlarging pulmonary nodule as mentioned previously.  Now on systemic chemotherapy consisting of FOLFOX + Avastin beginning on 01/04/2015.  Oncology history is updated.  Labs today: CBC.  I personally reviewed and went over laboratory results with the patient.  The results are noted within this dictation.  HGB is 10.9 g/dL and given renal dosing of Aranesp, this can be given today.  I personally reviewed and went over radiographic studies with the patient.  The results are noted within this dictation.  CT imaging from 08/29/2015 is reviewed with the patient and caretaker.  It demonstrates a new 3 mm pulmonary nodule.  We will follow this along.  Aranesp today as planned at renal dosing.   Subsequently, we will discontinue Aranesp and monitor her blood counts.  We can re-initiate in the future if indicated.    B12 injection is due today and monthly.    Oncology Flowsheet 09/01/2015  cyanocobalamin ((VITAMIN B-12)) IM 1,000 mcg    Labs in 4, 8 and 12 weeks: CBC diff, CMET, CEA.  CT Chest in 3 months for follow-up on pulmonary nodule.  Return for follow-up in 3 months following CT of chest.  She will be due for CT abd/pelvis imaging in 6 months according to NCCN guidelines and this can be set-up at her follow-up appointment.

## 2015-09-29 NOTE — Patient Instructions (Signed)
[place] at [facility] Discharge Instructions  RECOMMENDATIONS MADE BY THE CONSULTANT AND ANY TEST RESULTS WILL BE SENT TO YOUR REFERRING PHYSICIAN.  Aranesp and B12 given today. This will be your last Aranesp injection today. Still get B12 monthly. Follow up as scheduled.  Thank you for choosing [place] at [facility] to provide your oncology and hematology care.  To afford each patient quality time with our provider, please arrive at least 15 minutes before your scheduled appointment time.   Beginning January 23rd 2017 lab work for the [facility] will be done in the  Main lab at [facility] on 1st floor. If you have a lab appointment with the [place] please come in thru the  Main Entrance and check in at the main information desk  You need to re-schedule your appointment should you arrive 10 or more minutes late.  We strive to give you quality time with our providers, and arriving late affects you and other patients whose appointments are after yours.  Also, if you no show three or more times for appointments you may be dismissed from the clinic at the providers discretion.     Again, thank you for choosing [facility].  Our hope is that these requests will decrease the amount of time that you wait before being seen by our physicians.       _____________________________________________________________  Should you have questions after your visit to [facility], please contact our office at [phone] between the hours of 8:30 a.m. and 4:30 p.m.  Voicemails left after 4:30 p.m. will not be returned until the following business day.  For prescription refill requests, have your pharmacy contact our office.         Resources For Cancer Patients and their Caregivers ? American Cancer Society: Can assist with transportation, wigs, general needs, runs Look Good Feel Better.         [phone] ? Cancer Care: Provides financial assistance, online support groups, medication/co-pay assistance.  1-800-813-HOPE 364-689-3360) ? [place] Assists [place] cancer patients and their families through emotional , educational and financial support.  [phone] ? [place] DSS Where to apply for food stamps, Medicaid and utility assistance. [phone] ? RCATS: Transportation to medical appointments. 573-163-9617 ? Social Security Administration: May apply for disability if have a Stage IV cancer. [phone] ? [employer], Disability and Transit Services: Assists with nutrition, care and transit needs. [place] Support Programs: '@10RELATIVEDAYS'$ @ > Cancer Support Group  2nd Tuesday of the month 1pm-2pm, Journey Room  > Creative Journey  3rd Tuesday of the month 1130am-1pm, Journey Room  > Look Good Feel Better  1st Wednesday of the month 10am-12 noon, Journey Room (Call [place] to register [phone])

## 2015-09-29 NOTE — Progress Notes (Signed)
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 female returns for followup of Locally advanced adenocarcinoma of the rectum, She tolerated combined modality therapy well. Radiation therapy completed on 05/04/2013 which was also the last day she received Xeloda, excellent tolerance, status post APR on 07/26/2013 with marked cytoreduction documented as evidenced by no lymph node involvement. pT3 tumor was found. S/P 6 months of Xeloda at 1000 mg BID 2 weeks on and 1 week off finishing in December 2015. Serial CT imaging demonstrated an enlarging pulmonary nodule resulting in LLL superior segmentectomy by Dr. Roxan Hockey on 10/26/2014 demonstrating oligometastatic disease of rectal adenocarcinoma, resulting in an upstage to Stage IV disease.  She is now S/P 12 cycles of FOLFOX + Avastin 01/04/2015- 06/27/2015.    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   03/25/2013 - 05/05/2013 Chemotherapy Xeloda 1500 mg BID 5 days/week with radiation therapy.   07/26/2013 Definitive Surgery Dr. Arnoldo Morale- Flexible sigmoidoscopy, abdominoperineal resection, total abdominal hysterectomy with bilateral salpingo-oophorectomy   08/30/2013 - 03/01/2014 Chemotherapy Xeloda 1000 mg BID 14 days on and 7 days off x 6 months   03/08/2014 Imaging CT CAP- Bilateral pulmonary nodules, including an 8 mm left lower lobe nodule. Metastatic disease is a concern. The left lower lobe nodule has progressed since 03/05/2013, when it measured 5 mm.   05/26/2014 Imaging CT Chest-  Continued enlargement of the superior segment left lower lobe nodule. Malignancy is likely.   In contrast, the right lower lobe nodule is probably benign.   08/17/2014 Imaging CT- Superior segment left lower lobe pulmonary nodule measures stable since the most recent comparison study, but is again noted to have increased in size when comparing to older studies. Continued close attention will be required as neoplasm remains a con   10/26/2014 Surgery thoracoscopic left lower lobe superior segmentectomy. Pathology with metastatic adenocarcinoma c/w colonic primary   01/04/2015 - 06/27/2015 Chemotherapy FOLFOX+Avastin (Avastin started on 01/18/2015).   01/09/2015 Imaging CT CAP- Interval wedge resection of metastasis within the left lower lobe. No evidence of new metastatic disease within the chest, abdomen or pelvis.   02/15/2015 Treatment Plan Change Treatment deferred due to renal function change (patient poor historian) with N/V.   02/22/2015 Treatment Plan Change Added Aranesp at renal dosing to supportive therpay plan.   03/08/2015 Treatment Plan Change Defer treatment x 1 week   04/12/2015 Adverse Reaction Patient reported mouth sores.  None on exam.     04/12/2015 Treatment Plan Change 5FU bolus is discontinued for "mouth sores"   04/12/2015 Adverse Reaction Increasing Creatinine Cl.  CrCl calculated to be 30.7.     04/12/2015 Treatment Plan Change Oxaliplatin dose reduced by 20% to 65 mg/m2 based upon creatinine clearance.   05/24/2015 Treatment Plan Change 5FU CI decreased by 15%.   08/29/2015 Imaging CT CAP- Status post APR with left lower quadrant colostomy, and some stable presacral scarring. No definite evidence of metastatic disease in the CAP.  There is a new 3 mm nodule in the right lower lobe which is highly nonspecific. Attention on follow-up.  She is doing well and denies any complaints.  She continues to complete crossword puzzles.  She denies any changes in her bowel habits.  "The director of  the nursing facility wants me to find out the status of her cancer" the caretaker blurts out upon my introducing myself to her.  Review of Systems  Constitutional: Negative.  Negative for fever, chills and weight loss.  HENT: Negative.   Eyes: Negative.   Respiratory: Negative.   Cardiovascular: Negative.   Gastrointestinal: Negative.   Genitourinary: Negative.   Musculoskeletal: Negative.   Skin: Negative.   Neurological: Negative.  Negative for weakness.  Endo/Heme/Allergies: Negative.   Psychiatric/Behavioral: Negative.     Past Medical History  Diagnosis Date  . Hypertension   . Diabetes mellitus     years  . Asthma   . Depression   . Mental retardation     stopped school at 9th grade   . History of recurrent UTIs   . Shortness of breath     with exertion  . Cancer (HCC)     Rectal   . Arthritis   . Gout   . Seizures ([place])     more than 4 yrs since last seizure. UNknown etiology  . Rectal cancer ([place])   . Iron deficiency 04/27/2014  . Chronic renal disease, stage 3, moderately decreased glomerular filtration rate between 30-59 mL/min/1.73 square meter 04/27/2014  . Numbness and tingling in hands     x several months   . GERD (gastroesophageal reflux disease)   . Vitamin B 12 deficiency 02/01/2015    Incidentally found without antibody testing.      Past Surgical History  Procedure Laterality Date  . Abdominal hysterectomy    . Multiple extractions with alveoloplasty N/A 11/23/2012    Procedure: MULTIPLE EXTRACION 1, 2, 4, 5, 6, 7, 8, 9, 10, 11, 12, 13, 14, 17, 18, 20, 23, 24, 25, 26, 28, 29, 32 WITH ALVEOLOPLASTY, REMOVE BILATERAL TORI;  Surgeon: Gae Bon, DDS;  Location: [facility];  Service: Oral Surgery;  Laterality: N/A;  . Colonoscopy with esophagogastroduodenoscopy (egd) N/A 01/14/2013    Procedure: COLONOSCOPY WITH ESOPHAGOGASTRODUODENOSCOPY (EGD);  Surgeon: Rogene Houston, MD;  [facility];  Service: Endoscopy;  Laterality: N/A;  250-moved to  315 Ann to notify pt  . Colonoscopy N/A 02/04/2013    Procedure: COLONOSCOPY;  Surgeon: Rogene Houston, MD;  [facility];  Service: Endoscopy;  Laterality: N/A;  225  . Eus N/A 02/18/2013    Procedure: LOWER ENDOSCOPIC ULTRASOUND (EUS);  Surgeon: Milus Banister, MD;  Location[facility];  Service: Endoscopy;  Laterality: N/A;  . Flexible sigmoidoscopy N/A 07/26/2013    Procedure: FLEXIBLE SIGMOIDOSCOPY;  Surgeon: Jamesetta So, MD;  [facility];  Service: General;  Laterality: N/A;  . Abdominal perineal bowel resection N/A 07/26/2013    Procedure:  ABDOMINAL PERINEAL RESECTION;  Surgeon: Jamesetta So, MD;  [facility];  Service: General;  Laterality: N/A;  . Supracervical abdominal hysterectomy N/A 07/26/2013    Procedure: HYSTERECTOMY SUPRACERVICAL ABDOMINAL ;  Surgeon: Jamesetta So, MD;  [facility];  Service: General;  Laterality: N/A;  . Salpingoophorectomy Bilateral 07/26/2013    Procedure: SALPINGO OOPHORECTOMY;  Surgeon: Jamesetta So, MD;  [facility];  Service: General;  Laterality: Bilateral;  . Colostomy Left 07/26/2013    Procedure: COLOSTOMY;  Surgeon: Jamesetta So, MD;  [facility];  Service: General;  Laterality: Left;  .  Colonoscopy N/A 05/12/2014    Procedure: COLONOSCOPY;  Surgeon: Rogene Houston, MD;  [facility];  Service: Endoscopy;  Laterality: N/A;  1030  . Colon surgery  2015    bowel resection/w colostomy  . Video assisted thoracoscopy Left 10/26/2014    Procedure: LEFT VIDEO ASSISTED THORACOSCOPY;  Surgeon: [name], MD;  Location: [facility];  Service: Thoracic;  Laterality: Left;  . Segmentecomy Left 10/26/2014    Procedure: LEFT LOWER LOBE SUPERIOR SEGMENTECTOMY;  Surgeon: [name], MD;  Location: [place] [facility];  Service: Thoracic;  Laterality: Left;  . Lymph node dissection Left 10/26/2014    Procedure: LYMPH NODE DISSECTION;  Surgeon: [name], MD;  Location: [facility];  Service: Thoracic;   Laterality: Left;  . Portacath placement Left 01/02/2015    Procedure: INSERTION PORT-A-CATH;  Surgeon: Aviva Signs, MD;  [facility];  Service: General;  Laterality: Left;    History reviewed. No pertinent family history.  Social History   Social History  . Marital Status: Single    Spouse Name: N/A  . Number of Children: N/A  . Years of Education: N/A   Social History Main Topics  . Smoking status: Former Smoker    Types: Cigarettes    Quit date: 07/16/2008  . Smokeless tobacco: Never Used     Comment: unknown when stopped smoking      long time  . Alcohol Use: No  . Drug Use: No  . Sexual Activity: Not Asked   Other Topics Concern  . None   Social History Narrative     PHYSICAL EXAMINATION  ECOG PERFORMANCE STATUS: 2 - Symptomatic, <50% confined to bed  Filed Vitals:   09/29/15 1147  BP: 132/48  Pulse: 71  Temp: 98.3 F (36.8 C)  Resp: 18    GENERAL:alert, no distress, well nourished, well developed, comfortable, cooperative, obese, smiling and accompanied by caretaker SKIN: skin color, texture, turgor are normal, no rashes or significant lesions HEAD: Normocephalic, No masses, lesions, tenderness or abnormalities EYES: normal, EOMI, Conjunctiva are pink and non-injected EARS: External ears normal OROPHARYNX:lips, buccal mucosa, and tongue normal and mucous membranes are moist  NECK: supple, trachea midline LYMPH:  not examined BREAST:not examined LUNGS: clear to auscultation  HEART: regular rate & rhythm ABDOMEN:abdomen soft, non-tender and normal bowel sounds BACK: Back symmetric, no curvature. EXTREMITIES:less then 2 second capillary refill, no joint deformities, effusion, or inflammation, no skin discoloration, no cyanosis  NEURO: alert & oriented x 3 with fluent speech, no focal motor/sensory deficits, gait normal   LABORATORY DATA: CBC    Component Value Date/Time   WBC 4.7 09/29/2015 1117   RBC 3.90 09/29/2015 1117   RBC 3.84*  11/07/2009 1649   HGB 10.9* 09/29/2015 1117   HCT 34.8* 09/29/2015 1117   PLT 150 09/29/2015 1117   MCV 89.2 09/29/2015 1117   MCH 27.9 09/29/2015 1117   MCHC 31.3 09/29/2015 1117   RDW 15.3 09/29/2015 1117   LYMPHSABS 0.5* 06/27/2015 1020   MONOABS 0.5 06/27/2015 1020   EOSABS 0.1 06/27/2015 1020   BASOSABS 0.0 06/27/2015 1020      Chemistry      Component Value Date/Time   NA 137 08/04/2015 1037   K 4.4 08/04/2015 1037   CL 99* 08/04/2015 1037   CO2 29 08/04/2015 1037   BUN 44* 08/04/2015 1037   CREATININE 1.31* 08/04/2015 1037   CREATININE 1.11* 02/08/2013 1451      Component Value Date/Time   CALCIUM  9.3 08/04/2015 1037   ALKPHOS 146* 08/04/2015 1037   AST 18 08/04/2015 1037   ALT 18 08/04/2015 1037   BILITOT 0.3 08/04/2015 1037        PENDING LABS:   RADIOGRAPHIC STUDIES:  No results found.   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 Roxan Hockey on 10/26/2014 demonstrating recurrent Stage IV disease after undergoing Xeloda+XRT finishing on 05/04/2013, followed by APR on 07/26/2013 by Dr. Arnoldo Morale with marked cytoreduction documented, followed by 6 months worth of Xeloda 1000 mg BID 2 weeks on and 1 week off x 6 months.  Surveillance per NCCN guidelines was followed with serial CT imaging of chest demonstrating an enlarging pulmonary nodule as mentioned previously.  Now on systemic chemotherapy consisting of FOLFOX + Avastin beginning on 01/04/2015.  Oncology history is updated.  Labs today: CBC.  I personally reviewed and went over laboratory results with the patient.  The results are noted within this dictation.  HGB is 10.9 g/dL and given renal dosing of Aranesp, this can be given today.  I personally reviewed and went over radiographic studies with the patient.  The results are noted within this dictation.  CT imaging  from 08/29/2015 is reviewed with the patient and caretaker.  It demonstrates a new 3 mm pulmonary nodule.  We will follow this along.  Aranesp today as planned at renal dosing.   Subsequently, we will discontinue Aranesp and monitor her blood counts.  We can re-initiate in the future if indicated.    B12 injection is due today and monthly.    Oncology Flowsheet 09/01/2015  cyanocobalamin ((VITAMIN B-12)) IM 1,000 mcg    Labs in 4, 8 and 12 weeks: CBC diff, CMET, CEA.  CT Chest in 3 months for follow-up on pulmonary nodule.  Return for follow-up in 3 months following CT of chest.  She will be due for CT abd/pelvis imaging in 6 months according to NCCN guidelines and this can be set-up at her follow-up appointment.    ORDERS PLACED FOR THIS ENCOUNTER: Orders Placed This Encounter  Procedures  . CT Chest Wo Contrast  . CBC with Differential  . Comprehensive metabolic panel  . CEA    MEDICATIONS PRESCRIBED THIS ENCOUNTER: No orders of the defined types were placed in this encounter.    THERAPY PLAN:  Continue with B12 injections monthly and surveillance of pulmonary nodule.  NCCN guidelines regarding surveillance for rectal cancer are as follows (3. 2017):  1. Stage I with full surgical staging:   A. Colonoscopy 1 year    1. If advanced adenoma, repeat in 1 year.    2. If no advanced adenoma, repeat in 3 years, then every 5 years  2. Stage II, III:   A. History and physical every 3-6 months for 2 years, then every 6 months for a total of 5 years.   B. CEA every 3-6 months for 2 years, then every 6 months for a total of 5 years.   C. Chest/abdominal/pelvic CT every 6-12 months (category 2b for frequency less than 12 months) for a total of 5 years.   D. Colonoscopy in 1 year except if no preoperative colonoscopy due to obstructing lesion, colonoscopy in 3-6 months.    1. If advanced adenoma, repeat in 1 year    2. If no advanced adenoma, repeat in 3 years, then every 5  years.  E. Proctoscopy (with EUS or MRI with contrast) every 3-6 months for the first 2 years, then every 6 months for total of 5 years (for patients treated with transanal excision only).   F. PET/CT scan is not recommended.  3. Stage IV:   A. History and physical every 3-6 months for 2 years, then every 6 months for a total of 5 years.   B. CEA every 3-6 months for 2 years, then every 6 months for a total of 5 years.   C. Chest/abdominal/pelvic CT every 3-6 months (category 2b for frequency less than 6 months) for 2 years, then every 6-12 months for a total of 5 years.   D. Colonoscopy in 1 year except if no preoperative colonoscopy due to obstructing lesion, colonoscopy in 3-6 months.    1. If advanced adenoma, repeat in 1 year    2. If no advanced adenoma, repeat in 3 years, then every 5 years.   E. Proctoscopy (with EUS or MRI with contrast) every 3-6 months for the first 2 years, then every 6 months for total of 5 years (for patients treated with transanal excision only).   F. PET/CT scan is not recommended.          All questions were answered. The patient knows to call the clinic with any problems, questions or concerns. We can certainly see the patient much sooner if necessary.  Patient and plan discussed with Dr. Ancil Linsey and she is in agreement with the aforementioned.   This note is electronically signed by: Doy Mince 09/29/2015 5:34 PM

## 2015-10-01 NOTE — Progress Notes (Signed)
Patient ID: [name], female   DOB: May 06, 1950, 65 y.o.   MRN: [id number]  Subjective: 65 year old female presents the office with a caregiver for follow-up evaluation of infection to the right foot and localized below. She states that she's noticed antibiotic some the [place] the redness has resolved however she does have dry skin in between her toes. Denies any pain. Denies any systemic complaints such as fevers, chills, nausea, vomiting. No acute changes since last appointment, and no other complaints at this time.   Objective: AAO x3, NAD DP/PT pulses palpable bilaterally, CRT less than 3 seconds On the third interspace over the area previous below the area appears to be resolving and there is no edema, erythema, increase in warmth. There is no fluctuance or crepitus. There does appear to be some evidence of peeling skin however no bulla was identified. No other volar signs of infection are present at this time. No areas of pinpoint bony tenderness or pain with vibratory sensation. MMT 5/5, ROM WNL. No edema, erythema, increase in warmth to bilateral lower extremities.  No open lesions or pre-ulcerative lesions.  No pain with calf compression, swelling, warmth, erythema  Assessment: Resolved infection right foot  Plan: -All treatment options discussed with the patient including all alternatives, risks, complications.  -Area peeling skin was debrided. Continue to monitor for any reoccurrence of infection or any new blisters. Daily foot inspection. I'll see her back as scheduled for routine care or sooner if any issues are to arise. -Patient encouraged to call the office with any questions, concerns, change in symptoms.   Celesta Gentile, DPM

## 2015-10-02 ENCOUNTER — Other Ambulatory Visit (HOSPITAL_COMMUNITY): Payer: Self-pay | Admitting: Oncology

## 2015-10-25 ENCOUNTER — Other Ambulatory Visit (HOSPITAL_COMMUNITY): Payer: Self-pay | Admitting: Oncology

## 2015-10-26 ENCOUNTER — Encounter (HOSPITAL_COMMUNITY): Payer: Medicare Other

## 2015-10-31 ENCOUNTER — Encounter (HOSPITAL_COMMUNITY): Payer: Medicare Other | Attending: Hematology and Oncology

## 2015-10-31 ENCOUNTER — Encounter (HOSPITAL_COMMUNITY): Payer: Medicare Other

## 2015-10-31 ENCOUNTER — Other Ambulatory Visit (HOSPITAL_COMMUNITY): Payer: Medicare Other

## 2015-10-31 ENCOUNTER — Other Ambulatory Visit (HOSPITAL_COMMUNITY): Payer: Self-pay | Admitting: Pulmonary Disease

## 2015-10-31 ENCOUNTER — Ambulatory Visit (HOSPITAL_COMMUNITY)
Admission: RE | Admit: 2015-10-31 | Discharge: 2015-10-31 | Disposition: A | Discharge status: Home / Self Care | Payer: Medicare Other | Source: Ambulatory Visit | Attending: Pulmonary Disease | Admitting: Pulmonary Disease

## 2015-10-31 DIAGNOSIS — W19XXXA Unspecified fall, initial encounter: Secondary | ICD-10-CM

## 2015-10-31 DIAGNOSIS — C20 Malignant neoplasm of rectum: Secondary | ICD-10-CM

## 2015-10-31 DIAGNOSIS — C7800 Secondary malignant neoplasm of unspecified lung: Secondary | ICD-10-CM

## 2015-10-31 DIAGNOSIS — D649 Anemia, unspecified: Secondary | ICD-10-CM | POA: Diagnosis present

## 2015-10-31 DIAGNOSIS — M25531 Pain in right wrist: Secondary | ICD-10-CM | POA: Diagnosis present

## 2015-10-31 DIAGNOSIS — Z95828 Presence of other vascular implants and grafts: Secondary | ICD-10-CM

## 2015-10-31 DIAGNOSIS — E538 Deficiency of other specified B group vitamins: Secondary | ICD-10-CM | POA: Diagnosis not present

## 2015-10-31 DIAGNOSIS — M79641 Pain in right hand: Secondary | ICD-10-CM | POA: Insufficient documentation

## 2015-10-31 DIAGNOSIS — M19031 Primary osteoarthritis, right wrist: Secondary | ICD-10-CM | POA: Diagnosis not present

## 2015-10-31 LAB — COMPREHENSIVE METABOLIC PANEL
ALBUMIN: 3.6 g/dL (ref 3.5–5.0)
ALT: 20 U/L (ref 14–54)
ANION GAP: 7 (ref 5–15)
AST: 21 U/L (ref 15–41)
Alkaline Phosphatase: 203 U/L — ABNORMAL HIGH (ref 38–126)
BUN: 52 mg/dL — ABNORMAL HIGH (ref 6–20)
CHLORIDE: 97 mmol/L — AB (ref 101–111)
CO2: 29 mmol/L (ref 22–32)
Calcium: 8.6 mg/dL — ABNORMAL LOW (ref 8.9–10.3)
Creatinine, Ser: 1.4 mg/dL — ABNORMAL HIGH (ref 0.44–1.00)
GFR calc non Af Amer: 39 mL/min — ABNORMAL LOW (ref >60)
GFR, EST AFRICAN AMERICAN: 45 mL/min — AB (ref >60)
GLUCOSE: 100 mg/dL — AB (ref 65–99)
Potassium: 4.8 mmol/L (ref 3.5–5.1)
SODIUM: 133 mmol/L — AB (ref 135–145)
Total Bilirubin: 0.1 mg/dL — ABNORMAL LOW (ref 0.3–1.2)
Total Protein: 6.9 g/dL (ref 6.5–8.1)

## 2015-10-31 LAB — CBC WITH DIFFERENTIAL/PLATELET
BASOS PCT: 0 %
Basophils Absolute: 0 10*3/uL (ref 0.0–0.1)
EOS ABS: 0.3 10*3/uL (ref 0.0–0.7)
EOS PCT: 6 %
HCT: 35.5 % — ABNORMAL LOW (ref 36.0–46.0)
HEMOGLOBIN: 11.4 g/dL — AB (ref 12.0–15.0)
LYMPHS ABS: 0.7 10*3/uL (ref 0.7–4.0)
Lymphocytes Relative: 16 %
MCH: 28.2 pg (ref 26.0–34.0)
MCHC: 32.1 g/dL (ref 30.0–36.0)
MCV: 87.9 fL (ref 78.0–100.0)
Monocytes Absolute: 0.4 10*3/uL (ref 0.1–1.0)
Monocytes Relative: 9 %
NEUTROS PCT: 69 %
Neutro Abs: 3.3 10*3/uL (ref 1.7–7.7)
PLATELETS: 167 10*3/uL (ref 150–400)
RBC: 4.04 MIL/uL (ref 3.87–5.11)
RDW: 15.1 % (ref 11.5–15.5)
WBC: 4.7 10*3/uL (ref 4.0–10.5)

## 2015-10-31 MED ORDER — HEPARIN SOD (PORK) LOCK FLUSH 100 UNIT/ML IV SOLN
500.0000 [IU] | Freq: Once | INTRAVENOUS | Status: AC
  Administered 2015-10-31: 500 [IU] via INTRAVENOUS

## 2015-10-31 MED ORDER — HEPARIN SOD (PORK) LOCK FLUSH 100 UNIT/ML IV SOLN
INTRAVENOUS | Status: AC
  Filled 2015-10-31: qty 5

## 2015-10-31 MED ORDER — SODIUM CHLORIDE 0.9% FLUSH
10.0000 mL | Freq: Once | INTRAVENOUS | Status: AC
  Administered 2015-10-31: 10 mL via INTRAVENOUS

## 2015-10-31 MED ORDER — CYANOCOBALAMIN 1000 MCG/ML IJ SOLN
INTRAMUSCULAR | Status: AC
  Filled 2015-10-31: qty 1

## 2015-10-31 MED ORDER — CYANOCOBALAMIN 1000 MCG/ML IJ SOLN
1000.0000 ug | Freq: Once | INTRAMUSCULAR | Status: AC
  Administered 2015-10-31: 1000 ug via INTRAMUSCULAR

## 2015-10-31 NOTE — Progress Notes (Signed)
See other encounter.

## 2015-10-31 NOTE — Patient Instructions (Signed)
[place] at [facility] Discharge Instructions  RECOMMENDATIONS MADE BY THE CONSULTANT AND ANY TEST RESULTS WILL BE SENT TO YOUR REFERRING PHYSICIAN.  Port flush with lab work today. Vitamin B12 1000 mcg injection given as ordered. Return as scheduled.  Thank you for choosing [place] at [facility] to provide your oncology and hematology care.  To afford each patient quality time with our provider, please arrive at least 15 minutes before your scheduled appointment time.   Beginning January 23rd 2017 lab work for the [facility] will be done in the  Main lab at [facility] on 1st floor. If you have a lab appointment with the [facility] please come in thru the  Main Entrance and check in at the main information desk  You need to re-schedule your appointment should you arrive 10 or more minutes late.  We strive to give you quality time with our providers, and arriving late affects you and other patients whose appointments are after yours.  Also, if you no show three or more times for appointments you may be dismissed from the clinic at the providers discretion.     Again, thank you for choosing [facility].  Our hope is that these requests will decrease the amount of time that you wait before being seen by our physicians.       _____________________________________________________________  Should you have questions after your visit to [facility], please contact our office at [phone] between the hours of 8:30 a.m. and 4:30 p.m.  Voicemails left after 4:30 p.m. will not be returned until the following business day.  For prescription refill requests, have your pharmacy contact our office.         Resources For Cancer Patients and their Caregivers ? American Cancer Society: Can assist with transportation, wigs, general needs, runs Look Good Feel Better.        863-272-1963 ? Cancer Care: Provides  financial assistance, online support groups, medication/co-pay assistance.  1-800-813-HOPE (906) 054-0480) ? [facility] Assists [place] cancer patients and their families through emotional , educational and financial support.  3091608224 ? [place] DSS Where to apply for food stamps, Medicaid and utility assistance. 727 064 0820 ? [facility]: Transportation to medical appointments. 980-439-4655 ? Social Security Administration: May apply for disability if have a Stage IV cancer. 217-523-7215 858 801 2540 ? [employer], Disability and Transit Services: Assists with nutrition, care and transit needs. [place] Support Programs: '@10RELATIVEDAYS'$ @ > Cancer Support Group  2nd Tuesday of the month 1pm-2pm, Journey Room  > Creative Journey  3rd Tuesday of the month 1130am-1pm, Journey Room  > Look Good Feel Better  1st Wednesday of the month 10am-12 noon, Journey Room (Call [facility] to register [phone])

## 2015-10-31 NOTE — Progress Notes (Signed)
[name] presented for Portacath access and flush. Proper placement of portacath confirmed by CXR. Portacath located left chest wall accessed with  H 20 needle. Good blood return present. Portacath flushed with 34m NS and 500U/541mHeparin and needle removed intact. Procedure without incident. Patient tolerated procedure well.   Jennifer Cregoresents today for injection per MD orders. B12 1000 mcg administered IM in right Upper Arm. Administration without incident. Patient tolerated well.

## 2015-11-01 LAB — CEA: CEA: 5.1 ng/mL — AB (ref 0.0–4.7)

## 2015-12-01 ENCOUNTER — Encounter (HOSPITAL_COMMUNITY): Payer: Medicare Other | Attending: Hematology & Oncology

## 2015-12-01 ENCOUNTER — Encounter (HOSPITAL_COMMUNITY): Payer: Self-pay

## 2015-12-01 ENCOUNTER — Encounter (HOSPITAL_COMMUNITY): Payer: Medicare Other

## 2015-12-01 VITALS — BP 150/65 | HR 77 | Temp 98.3°F | Resp 18

## 2015-12-01 DIAGNOSIS — C20 Malignant neoplasm of rectum: Secondary | ICD-10-CM | POA: Insufficient documentation

## 2015-12-01 DIAGNOSIS — E538 Deficiency of other specified B group vitamins: Secondary | ICD-10-CM

## 2015-12-01 DIAGNOSIS — C7800 Secondary malignant neoplasm of unspecified lung: Principal | ICD-10-CM

## 2015-12-01 LAB — CBC WITH DIFFERENTIAL/PLATELET
BASOS ABS: 0 10*3/uL (ref 0.0–0.1)
Basophils Relative: 0 %
EOS PCT: 4 %
Eosinophils Absolute: 0.2 10*3/uL (ref 0.0–0.7)
HEMATOCRIT: 32.5 % — AB (ref 36.0–46.0)
Hemoglobin: 10.4 g/dL — ABNORMAL LOW (ref 12.0–15.0)
LYMPHS PCT: 17 %
Lymphs Abs: 0.8 10*3/uL (ref 0.7–4.0)
MCH: 27.6 pg (ref 26.0–34.0)
MCHC: 32 g/dL (ref 30.0–36.0)
MCV: 86.2 fL (ref 78.0–100.0)
MONO ABS: 0.3 10*3/uL (ref 0.1–1.0)
MONOS PCT: 6 %
NEUTROS ABS: 3.2 10*3/uL (ref 1.7–7.7)
Neutrophils Relative %: 73 %
PLATELETS: 129 10*3/uL — AB (ref 150–400)
RBC: 3.77 MIL/uL — ABNORMAL LOW (ref 3.87–5.11)
RDW: 15.4 % (ref 11.5–15.5)
WBC: 4.4 10*3/uL (ref 4.0–10.5)

## 2015-12-01 LAB — COMPREHENSIVE METABOLIC PANEL
ALT: 23 U/L (ref 14–54)
ANION GAP: 9 (ref 5–15)
AST: 21 U/L (ref 15–41)
Albumin: 3.6 g/dL (ref 3.5–5.0)
Alkaline Phosphatase: 243 U/L — ABNORMAL HIGH (ref 38–126)
BILIRUBIN TOTAL: 0.4 mg/dL (ref 0.3–1.2)
BUN: 57 mg/dL — ABNORMAL HIGH (ref 6–20)
CHLORIDE: 101 mmol/L (ref 101–111)
CO2: 27 mmol/L (ref 22–32)
Calcium: 8.9 mg/dL (ref 8.9–10.3)
Creatinine, Ser: 1.49 mg/dL — ABNORMAL HIGH (ref 0.44–1.00)
GFR, EST AFRICAN AMERICAN: 42 mL/min — AB (ref >60)
GFR, EST NON AFRICAN AMERICAN: 36 mL/min — AB (ref >60)
Glucose, Bld: 134 mg/dL — ABNORMAL HIGH (ref 65–99)
POTASSIUM: 4.2 mmol/L (ref 3.5–5.1)
Sodium: 137 mmol/L (ref 135–145)
Total Protein: 6.8 g/dL (ref 6.5–8.1)

## 2015-12-01 MED ORDER — CYANOCOBALAMIN 1000 MCG/ML IJ SOLN
INTRAMUSCULAR | Status: AC
Start: 2015-12-01 — End: 2015-12-01
  Filled 2015-12-01: qty 1

## 2015-12-01 MED ORDER — CYANOCOBALAMIN 1000 MCG/ML IJ SOLN
1000.0000 ug | Freq: Once | INTRAMUSCULAR | Status: AC
  Administered 2015-12-01: 1000 ug via INTRAMUSCULAR

## 2015-12-01 NOTE — Progress Notes (Signed)
Pt here today for B12 injection. Pt given injection in left deltoid. Pt tolerated injection well. Pt stable and discharged home.

## 2015-12-01 NOTE — Patient Instructions (Signed)
[facility] at [facility] Discharge Instructions  RECOMMENDATIONS MADE BY THE CONSULTANT AND ANY TEST RESULTS WILL BE SENT TO YOUR REFERRING PHYSICIAN.   You were given a B12 injection today. Return as scheduled.   Thank you for choosing [facility] at [facility] to provide your oncology and hematology care.  To afford each patient quality time with our provider, please arrive at least 15 minutes before your scheduled appointment time.   Beginning January 23rd 2017 lab work for the Cancer Center will be done in the  Main lab at [facility] on 1st floor. If you have a lab appointment with the Cancer Center please come in thru the  Main Entrance and check in at the main information desk  You need to re-schedule your appointment should you arrive 10 or more minutes late.  We strive to give you quality time with our providers, and arriving late affects you and other patients whose appointments are after yours.  Also, if you no show three or more times for appointments you may be dismissed from the clinic at the providers discretion.     Again, thank you for choosing [facility].  Our hope is that these requests will decrease the amount of time that you wait before being seen by our physicians.       _____________________________________________________________  Should you have questions after your visit to [facility], please contact our office at [phone] between the hours of 8:30 a.m. and 4:30 p.m.  Voicemails left after 4:30 p.m. will not be returned until the following business day.  For prescription refill requests, have your pharmacy contact our office.         Resources For Cancer Patients and their Caregivers ? American Cancer Society: Can assist with transportation, wigs, general needs, runs Look Good Feel Better.        1-888-227-6333 ? Cancer Care: Provides financial assistance, online support groups,  medication/co-pay assistance.  1-800-813-HOPE (4673) ? [facility] Assists [place] cancer patients and their families through emotional , educational and financial support.  336-427-4357 ? [place] DSS Where to apply for food stamps, Medicaid and utility assistance. 336-342-1394 ? RC[facility]: Transportation to medical appointments. 336-347-2287 ? Social Security Administration: May apply for disability if have a Stage IV cancer. 336-342-7796 1-800-772-1213 ? [place] A[facility], Disability and Transit Services: Assists with nutrition, care and transit needs. 336-349-2343  Cancer Center Support Programs: @10RELATIVEDAYS@ > Cancer Support Group  2nd Tuesday of the month 1pm-2pm, Journey Room  > Creative Journey  3rd Tuesday of the month 1130am-1pm, Journey Room  > Look Good Feel Better  1st Wednesday of the month 10am-12 noon, Journey Room (Call American Cancer Society to register 1-800-395-5775)   

## 2015-12-02 LAB — CEA: CEA: 4.3 ng/mL (ref 0.0–4.7)

## 2015-12-13 ENCOUNTER — Other Ambulatory Visit (HOSPITAL_COMMUNITY): Payer: Self-pay | Admitting: Internal Medicine

## 2015-12-13 ENCOUNTER — Ambulatory Visit (HOSPITAL_COMMUNITY)
Admission: RE | Admit: 2015-12-13 | Discharge: 2015-12-13 | Disposition: A | Discharge status: Home / Self Care | Payer: Medicare Other | Source: Ambulatory Visit | Attending: Internal Medicine | Admitting: Internal Medicine

## 2015-12-13 DIAGNOSIS — M19042 Primary osteoarthritis, left hand: Secondary | ICD-10-CM | POA: Insufficient documentation

## 2015-12-13 DIAGNOSIS — M25442 Effusion, left hand: Secondary | ICD-10-CM | POA: Diagnosis present

## 2015-12-13 DIAGNOSIS — R609 Edema, unspecified: Secondary | ICD-10-CM

## 2015-12-29 ENCOUNTER — Other Ambulatory Visit (HOSPITAL_COMMUNITY): Payer: Self-pay | Admitting: Oncology

## 2016-01-01 ENCOUNTER — Encounter (HOSPITAL_COMMUNITY): Payer: Medicare Other

## 2016-01-01 ENCOUNTER — Encounter (HOSPITAL_COMMUNITY): Payer: Medicare Other | Attending: Hematology and Oncology

## 2016-01-01 ENCOUNTER — Encounter (HOSPITAL_COMMUNITY): Payer: Self-pay

## 2016-01-01 ENCOUNTER — Other Ambulatory Visit (HOSPITAL_COMMUNITY): Payer: Medicare Other

## 2016-01-01 VITALS — BP 144/69 | HR 86 | Temp 98.9°F | Resp 16

## 2016-01-01 DIAGNOSIS — C20 Malignant neoplasm of rectum: Secondary | ICD-10-CM | POA: Insufficient documentation

## 2016-01-01 DIAGNOSIS — E538 Deficiency of other specified B group vitamins: Secondary | ICD-10-CM | POA: Diagnosis not present

## 2016-01-01 DIAGNOSIS — Z95828 Presence of other vascular implants and grafts: Secondary | ICD-10-CM

## 2016-01-01 DIAGNOSIS — N183 Chronic kidney disease, stage 3 unspecified: Secondary | ICD-10-CM

## 2016-01-01 DIAGNOSIS — D649 Anemia, unspecified: Secondary | ICD-10-CM | POA: Diagnosis present

## 2016-01-01 LAB — CBC WITH DIFFERENTIAL/PLATELET
BASOS PCT: 0 %
Basophils Absolute: 0 10*3/uL (ref 0.0–0.1)
EOS ABS: 0.2 10*3/uL (ref 0.0–0.7)
Eosinophils Relative: 3 %
HCT: 31.2 % — ABNORMAL LOW (ref 36.0–46.0)
HEMOGLOBIN: 9.8 g/dL — AB (ref 12.0–15.0)
Lymphocytes Relative: 15 %
Lymphs Abs: 0.7 10*3/uL (ref 0.7–4.0)
MCH: 27.4 pg (ref 26.0–34.0)
MCHC: 31.4 g/dL (ref 30.0–36.0)
MCV: 87.2 fL (ref 78.0–100.0)
MONOS PCT: 8 %
Monocytes Absolute: 0.4 10*3/uL (ref 0.1–1.0)
NEUTROS PCT: 74 %
Neutro Abs: 3.6 10*3/uL (ref 1.7–7.7)
PLATELETS: 155 10*3/uL (ref 150–400)
RBC: 3.58 MIL/uL — ABNORMAL LOW (ref 3.87–5.11)
RDW: 15.9 % — AB (ref 11.5–15.5)
WBC: 4.9 10*3/uL (ref 4.0–10.5)

## 2016-01-01 LAB — COMPREHENSIVE METABOLIC PANEL
ALBUMIN: 3.7 g/dL (ref 3.5–5.0)
ALK PHOS: 260 U/L — AB (ref 38–126)
ALT: 27 U/L (ref 14–54)
ANION GAP: 8 (ref 5–15)
AST: 24 U/L (ref 15–41)
BUN: 46 mg/dL — ABNORMAL HIGH (ref 6–20)
CHLORIDE: 101 mmol/L (ref 101–111)
CO2: 27 mmol/L (ref 22–32)
Calcium: 8.5 mg/dL — ABNORMAL LOW (ref 8.9–10.3)
Creatinine, Ser: 1.49 mg/dL — ABNORMAL HIGH (ref 0.44–1.00)
GFR calc Af Amer: 42 mL/min — ABNORMAL LOW (ref >60)
GFR calc non Af Amer: 36 mL/min — ABNORMAL LOW (ref >60)
GLUCOSE: 114 mg/dL — AB (ref 65–99)
POTASSIUM: 4.7 mmol/L (ref 3.5–5.1)
SODIUM: 136 mmol/L (ref 135–145)
Total Bilirubin: 0.4 mg/dL (ref 0.3–1.2)
Total Protein: 6.9 g/dL (ref 6.5–8.1)

## 2016-01-01 MED ORDER — SODIUM CHLORIDE 0.9% FLUSH
10.0000 mL | INTRAVENOUS | Status: DC | PRN
  Administered 2016-01-01: 10 mL via INTRAVENOUS
  Filled 2016-01-01: qty 10

## 2016-01-01 MED ORDER — CYANOCOBALAMIN 1000 MCG/ML IJ SOLN
INTRAMUSCULAR | Status: AC
  Filled 2016-01-01: qty 1

## 2016-01-01 MED ORDER — CYANOCOBALAMIN 1000 MCG/ML IJ SOLN
1000.0000 ug | Freq: Once | INTRAMUSCULAR | Status: AC
  Administered 2016-01-01: 1000 ug via INTRAMUSCULAR

## 2016-01-01 MED ORDER — HEPARIN SOD (PORK) LOCK FLUSH 100 UNIT/ML IV SOLN
500.0000 [IU] | Freq: Once | INTRAVENOUS | Status: AC
  Administered 2016-01-01: 500 [IU] via INTRAVENOUS
  Filled 2016-01-01: qty 5

## 2016-01-01 NOTE — Progress Notes (Signed)
See other encounter.

## 2016-01-01 NOTE — Patient Instructions (Signed)
[place] at [facility] Discharge Instructions  RECOMMENDATIONS MADE BY THE CONSULTANT AND ANY TEST RESULTS WILL BE SENT TO YOUR REFERRING PHYSICIAN.  B12 injection and port flush given today. Follow up as scheduled  Thank you for choosing [place] at [facility] to provide your oncology and hematology care.  To afford each patient quality time with our provider, please arrive at least 15 minutes before your scheduled appointment time.   Beginning January 23rd 2017 lab work for the [facility] will be done in the  Main lab at [facility] on 1st floor. If you have a lab appointment with the [facility] please come in thru the  Main Entrance and check in at the main information desk  You need to re-schedule your appointment should you arrive 10 or more minutes late.  We strive to give you quality time with our providers, and arriving late affects you and other patients whose appointments are after yours.  Also, if you no show three or more times for appointments you may be dismissed from the clinic at the providers discretion.     Again, thank you for choosing [facility].  Our hope is that these requests will decrease the amount of time that you wait before being seen by our physicians.       _____________________________________________________________  Should you have questions after your visit to [facility], please contact our office at [phone] between the hours of 8:30 a.m. and 4:30 p.m.  Voicemails left after 4:30 p.m. will not be returned until the following business day.  For prescription refill requests, have your pharmacy contact our office.         Resources For Cancer Patients and their Caregivers ? American Cancer Society: Can assist with transportation, wigs, general needs, runs Look Good Feel Better.        (978)778-6988 ? Cancer Care: Provides financial assistance, online support  groups, medication/co-pay assistance.  1-800-813-HOPE 727-453-5683) ? [place] Assists [place] cancer patients and their families through emotional , educational and financial support.  (985)118-3720 ? [place] DSS Where to apply for food stamps, Medicaid and utility assistance. (832)733-8390 ? RCATS: Transportation to medical appointments. 443-060-4863 ? Social Security Administration: May apply for disability if have a Stage IV cancer. 825-653-7884 828-274-8575 ? [employer], Disability and Transit Services: Assists with nutrition, care and transit needs. [place] Support Programs: '@10RELATIVEDAYS'$ @ > Cancer Support Group  2nd Tuesday of the month 1pm-2pm, Journey Room  > Creative Journey  3rd Tuesday of the month 1130am-1pm, Journey Room  > Look Good Feel Better  1st Wednesday of the month 10am-12 noon, Journey Room (Call [place] to register [phone])

## 2016-01-01 NOTE — Progress Notes (Signed)
[name] presents today for injection per MD orders. B12 1,073mg administered IM  in left Upper Arm. Administration without incident. Patient tolerated well.  LKipp Laurencepresented for Portacath access and flush. Portacath located left chest wall accessed with  H 20 needle. Good blood return present. Portacath flushed with 210mNS and 500U/68m29meparin and needle removed intact. Procedure without incident. Patient tolerated procedure well.  Labs drawn as well.  Vitals stable with no complaints. Discharged from clinic via wheelchair with family.

## 2016-01-03 ENCOUNTER — Ambulatory Visit (HOSPITAL_COMMUNITY)
Admission: RE | Admit: 2016-01-03 | Discharge: 2016-01-03 | Disposition: A | Discharge status: Home / Self Care | Payer: Medicare Other | Source: Ambulatory Visit | Attending: Oncology | Admitting: Oncology

## 2016-01-03 DIAGNOSIS — C7800 Secondary malignant neoplasm of unspecified lung: Secondary | ICD-10-CM | POA: Diagnosis not present

## 2016-01-03 DIAGNOSIS — R911 Solitary pulmonary nodule: Secondary | ICD-10-CM

## 2016-01-03 DIAGNOSIS — M4810 Ankylosing hyperostosis [Forestier], site unspecified: Secondary | ICD-10-CM | POA: Insufficient documentation

## 2016-01-03 DIAGNOSIS — N2889 Other specified disorders of kidney and ureter: Secondary | ICD-10-CM | POA: Diagnosis not present

## 2016-01-03 DIAGNOSIS — C20 Malignant neoplasm of rectum: Secondary | ICD-10-CM

## 2016-01-03 DIAGNOSIS — Z9889 Other specified postprocedural states: Secondary | ICD-10-CM | POA: Insufficient documentation

## 2016-01-03 DIAGNOSIS — R918 Other nonspecific abnormal finding of lung field: Secondary | ICD-10-CM | POA: Insufficient documentation

## 2016-01-05 ENCOUNTER — Encounter (HOSPITAL_COMMUNITY): Payer: Self-pay | Admitting: Oncology

## 2016-01-05 ENCOUNTER — Encounter (HOSPITAL_BASED_OUTPATIENT_CLINIC_OR_DEPARTMENT_OTHER): Payer: Medicare Other | Admitting: Oncology

## 2016-01-05 VITALS — BP 144/67 | HR 88 | Temp 99.0°F | Resp 18 | Wt 237.1 lb

## 2016-01-05 DIAGNOSIS — C20 Malignant neoplasm of rectum: Secondary | ICD-10-CM | POA: Diagnosis not present

## 2016-01-05 DIAGNOSIS — N183 Chronic kidney disease, stage 3 unspecified: Secondary | ICD-10-CM

## 2016-01-05 DIAGNOSIS — Z23 Encounter for immunization: Secondary | ICD-10-CM

## 2016-01-05 DIAGNOSIS — R911 Solitary pulmonary nodule: Secondary | ICD-10-CM

## 2016-01-05 DIAGNOSIS — Z0000 Encounter for general adult medical examination without abnormal findings: Secondary | ICD-10-CM

## 2016-01-05 DIAGNOSIS — C7800 Secondary malignant neoplasm of unspecified lung: Secondary | ICD-10-CM | POA: Diagnosis not present

## 2016-01-05 DIAGNOSIS — D631 Anemia in chronic kidney disease: Secondary | ICD-10-CM | POA: Diagnosis not present

## 2016-01-05 MED ORDER — INFLUENZA VAC SPLIT QUAD 0.5 ML IM SUSY
0.5000 mL | PREFILLED_SYRINGE | Freq: Once | INTRAMUSCULAR | Status: AC
  Administered 2016-01-05: 0.5 mL via INTRAMUSCULAR
  Filled 2016-01-05: qty 0.5

## 2016-01-05 NOTE — Assessment & Plan Note (Addendum)
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 Roxan Hockey on 10/26/2014 demonstrating recurrent Stage IV disease after undergoing Xeloda+XRT finishing on 05/04/2013, followed by APR on 07/26/2013 by Dr. Arnoldo Morale with marked cytoreduction documented, followed by 6 months worth of Xeloda 1000 mg BID 2 weeks on and 1 week off x 6 months.  Surveillance per NCCN guidelines was followed with serial CT imaging of chest demonstrating an enlarging pulmonary nodule as mentioned previously.  S/P 12 cycles of FOLFOX + Avastin (01/04/2015- 06/27/2015).  Oncology history is updated.  Labs today: CBC diff, CMET.  I personally reviewed and went over laboratory results with the patient.  The results are noted within this dictation.  I personally reviewed and went over radiographic studies with the patient.  The results are noted within this dictation.  CT imaging of chest is negative for any findings concerning for malignancy.  B12 injection is due today next month and monthly.    Labs in 8 weeks: CBC diff, CMET, CEA, anemia panel.  Influenza vaccine given today.  Return for follow-up in 2 months.  She will be due for CT chest/abd/pelvis imaging in June 2018.

## 2016-01-05 NOTE — Patient Instructions (Addendum)
[place] at [facility] Discharge Instructions  RECOMMENDATIONS MADE BY THE CONSULTANT AND ANY TEST RESULTS WILL BE SENT TO YOUR REFERRING PHYSICIAN.  You saw Kirby Crigler, PA-C, today. You had flu shot today. You need your port a cath flushed every 6 weeks. Follow up and labs in 6-8 weeks.  Thank you for choosing [place] at [facility] to provide your oncology and hematology care.  To afford each patient quality time with our provider, please arrive at least 15 minutes before your scheduled appointment time.   Beginning January 23rd 2017 lab work for the [facility] will be done in the  Main lab at [facility] on 1st floor. If you have a lab appointment with the [facility] please come in thru the  Main Entrance and check in at the main information desk  You need to re-schedule your appointment should you arrive 10 or more minutes late.  We strive to give you quality time with our providers, and arriving late affects you and other patients whose appointments are after yours.  Also, if you no show three or more times for appointments you may be dismissed from the clinic at the providers discretion.     Again, thank you for choosing [facility].  Our hope is that these requests will decrease the amount of time that you wait before being seen by our physicians.       _____________________________________________________________  Should you have questions after your visit to [facility], please contact our office at [phone] between the hours of 8:30 a.m. and 4:30 p.m.  Voicemails left after 4:30 p.m. will not be returned until the following business day.  For prescription refill requests, have your pharmacy contact our office.         Resources For Cancer Patients and their Caregivers ? American Cancer Society: Can assist with transportation, wigs, general needs, runs Look Good Feel Better.         912-083-4897 ? Cancer Care: Provides financial assistance, online support groups, medication/co-pay assistance.  1-800-813-HOPE 863-644-6744) ? [facility] Assists [place] cancer patients and their families through emotional , educational and financial support.  713-090-9436 ? [place] DSS Where to apply for food stamps, Medicaid and utility assistance. (587)580-0595 ? [employer]ATS: Transportation to medical appointments. (215) 029-0550 ? Social Security Administration: May apply for disability if have a Stage IV cancer. 651 745 1393 251-368-0794 ? [employer], Disability and Transit Services: Assists with nutrition, care and transit needs. [place] Support Programs: '@10RELATIVEDAYS'$ @ > Cancer Support Group  2nd Tuesday of the month 1pm-2pm, Journey Room  > Creative Journey  3rd Tuesday of the month 1130am-1pm, Journey Room  > Look Good Feel Better  1st Wednesday of the month 10am-12 noon, Journey Room (Call American Cancer Society to register [phone])   Influenza Virus Vaccine injection (Fluarix) What is this medicine? INFLUENZA VIRUS VACCINE (in floo EN zuh VAHY ruhs vak SEEN) helps to reduce the risk of getting influenza also known as the flu. This medicine may be used for other purposes; ask your health care provider or pharmacist if you have questions. What should I tell my health care provider before I take this medicine? They need to know if you have any of these conditions: -bleeding disorder like hemophilia -fever or infection -Guillain-Barre syndrome or other neurological problems -immune system problems -infection with the human immunodeficiency virus (HIV) or AIDS -low blood platelet  counts -multiple sclerosis -an unusual or allergic reaction to influenza virus vaccine, eggs, chicken proteins, latex, gentamicin, other medicines, foods, dyes or preservatives -pregnant or trying to get  pregnant -breast-feeding How should I use this medicine? This vaccine is for injection into a muscle. It is given by a health care professional. A copy of Vaccine Information Statements will be given before each vaccination. Read this sheet carefully each time. The sheet may change frequently. Talk to your pediatrician regarding the use of this medicine in children. Special care may be needed. Overdosage: If you think you have taken too much of this medicine contact a poison control center or emergency room at once. NOTE: This medicine is only for you. Do not share this medicine with others. What if I miss a dose? This does not apply. What may interact with this medicine? -chemotherapy or radiation therapy -medicines that lower your immune system like etanercept, anakinra, infliximab, and adalimumab -medicines that treat or prevent blood clots like warfarin -phenytoin -steroid medicines like prednisone or cortisone -theophylline -vaccines This list may not describe all possible interactions. Give your health care provider a list of all the medicines, herbs, non-prescription drugs, or dietary supplements you use. Also tell them if you smoke, drink alcohol, or use illegal drugs. Some items may interact with your medicine. What should I watch for while using this medicine? Report any side effects that do not go away within 3 days to your doctor or health care professional. Call your health care provider if any unusual symptoms occur within 6 weeks of receiving this vaccine. You may still catch the flu, but the illness is not usually as bad. You cannot get the flu from the vaccine. The vaccine will not protect against colds or other illnesses that may cause fever. The vaccine is needed every year. What side effects may I notice from receiving this medicine? Side effects that you should report to your doctor or health care professional as soon as possible: -allergic reactions like skin rash,  itching or hives, swelling of the face, lips, or tongue Side effects that usually do not require medical attention (report to your doctor or health care professional if they continue or are bothersome): -fever -headache -muscle aches and pains -pain, tenderness, redness, or swelling at site where injected -weak or tired This list may not describe all possible side effects. Call your doctor for medical advice about side effects. You may report side effects to FDA at 1-800-FDA-1088. Where should I keep my medicine? This vaccine is only given in a clinic, pharmacy, doctor's office, or other health care setting and will not be stored at home. NOTE: This sheet is a summary. It may not cover all possible information. If you have questions about this medicine, talk to your doctor, pharmacist, or health care provider.    2016, Elsevier/Gold Standard. (2007-09-30 09:30:40)

## 2016-01-05 NOTE — Progress Notes (Signed)
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 female returns for followup of Locally advanced adenocarcinoma of the rectum, She tolerated combined modality therapy well. Radiation therapy completed on 05/04/2013 which was also the last day she received Xeloda, excellent tolerance, status post APR on 07/26/2013 with marked cytoreduction documented as evidenced by no lymph node involvement. pT3 tumor was found. S/P 6 months of Xeloda at 1000 mg BID 2 weeks on and 1 week off finishing in December 2015. Serial CT imaging demonstrated an enlarging pulmonary nodule resulting in LLL superior segmentectomy by Dr. Roxan Hockey on 10/26/2014 demonstrating oligometastatic disease of rectal adenocarcinoma, resulting in an upstage to Stage IV disease.  She is now S/P 12 cycles of FOLFOX + Avastin 01/04/2015- 06/27/2015.    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      03/25/2013 - 05/05/2013 Chemotherapy    Xeloda 1500 mg BID 5 days/week with radiation therapy.      07/26/2013 Definitive Surgery    Dr. Arnoldo Morale- Flexible sigmoidoscopy, abdominoperineal resection, total abdominal hysterectomy with bilateral salpingo-oophorectomy      08/30/2013 - 03/01/2014 Chemotherapy    Xeloda 1000 mg BID 14  days on and 7 days off x 6 months      03/08/2014 Imaging    CT CAP- Bilateral pulmonary nodules, including an 8 mm left lower lobe nodule. Metastatic disease is a concern. The left lower lobe nodule has progressed since 03/05/2013, when it measured 5 mm.      05/26/2014 Imaging    CT Chest- Continued enlargement of the superior segment left lower lobe nodule. Malignancy is likely.   In contrast, the right lower lobe nodule is probably benign.      08/17/2014 Imaging    CT- Superior segment left lower lobe pulmonary nodule measures stable since the most recent comparison study, but is again noted to have increased in size when comparing to older studies. Continued close attention will be required as neoplasm remains a con      10/26/2014 Surgery    thoracoscopic left lower lobe superior segmentectomy. Pathology with metastatic adenocarcinoma c/w colonic primary      01/04/2015 - 06/27/2015 Chemotherapy    FOLFOX+Avastin (Avastin started on 01/18/2015).      01/09/2015 Imaging    CT CAP- Interval wedge resection of metastasis within the left lower lobe. No evidence of new metastatic disease within the chest, abdomen or pelvis.      02/15/2015 Treatment Plan Change    Treatment deferred due to renal function change (patient poor historian) with N/V.      02/22/2015 Treatment Plan Change    Added Aranesp at renal dosing to supportive therpay plan.      03/08/2015 Treatment Plan Change    Defer treatment x 1 week  04/12/2015 Adverse Reaction    Patient reported mouth sores.  None on exam.        04/12/2015 Treatment Plan Change    5FU bolus is discontinued for "mouth sores"      04/12/2015 Adverse Reaction    Increasing Creatinine Cl.  CrCl calculated to be 30.7.        04/12/2015 Treatment Plan Change    Oxaliplatin dose reduced by 20% to 65 mg/m2 based upon creatinine clearance.      05/24/2015 Treatment Plan Change    5FU CI decreased by 15%.      08/29/2015 Imaging     CT CAP- Status post APR with left lower quadrant colostomy, and some stable presacral scarring. No definite evidence of metastatic disease in the CAP.  There is a new 3 mm nodule in the right lower lobe which is highly nonspecific. Attention on follow-up.      01/03/2016 Imaging    CT chest- No definite findings to suggest metastatic disease to the lungs. Previously noted 3 mm right lower lobe pulmonary nodule stable, favored to be benign.      She is doing well without any new complaints. She is seen in a wheelchair doing her crossword puzzles. She denies any bowel or bladder complaints. She denies any change in her appetite or unintentional weight loss. She denies any cough or shortness of breath. She denies any chest pain or hemoptysis.  Review of Systems  Constitutional: Negative.  Negative for chills, fever and weight loss.  HENT: Negative.   Eyes: Negative.  Negative for blurred vision.  Respiratory: Negative.  Negative for cough.   Cardiovascular: Negative.  Negative for chest pain.  Gastrointestinal: Negative.  Negative for abdominal pain, constipation, diarrhea, nausea and vomiting.  Genitourinary: Negative.   Musculoskeletal: Negative.   Skin: Negative.   Neurological: Negative.  Negative for weakness.  Endo/Heme/Allergies: Negative.   Psychiatric/Behavioral: Negative.     Past Medical History:  Diagnosis Date  . Arthritis   . Asthma   . Cancer (HCC)    Rectal   . Chronic renal disease, stage 3, moderately decreased glomerular filtration rate between 30-59 mL/min/1.73 square meter 04/27/2014  . Depression   . Diabetes mellitus    years  . GERD (gastroesophageal reflux disease)   . Gout   . History of recurrent UTIs   . Hypertension   . Iron deficiency 04/27/2014  . Mental retardation    stopped school at 9th grade   . Numbness and tingling in hands    x several months   . Rectal cancer ([place])   . Seizures ([place])    more than 4 yrs since last seizure. UNknown  etiology  . Shortness of breath    with exertion  . Vitamin B 12 deficiency 02/01/2015   Incidentally found without antibody testing.      Past Surgical History:  Procedure Laterality Date  . ABDOMINAL HYSTERECTOMY    . ABDOMINAL PERINEAL BOWEL RESECTION N/A 07/26/2013   Procedure:  ABDOMINAL PERINEAL RESECTION;  Surgeon: Jamesetta So, MD;  [facility]: [facility];  Service: General;  Laterality: N/A;  . COLON SURGERY  2015   bowel resection/w colostomy  . COLONOSCOPY N/A 02/04/2013   Procedure: COLONOSCOPY;  Surgeon: Rogene Houston, MD;  [facility];  Service: Endoscopy;  Laterality: N/A;  225  . COLONOSCOPY N/A 05/12/2014   Procedure: COLONOSCOPY;  Surgeon: Rogene Houston, MD;  [facility];  Service: Endoscopy;  Laterality: N/A;  1030  . COLONOSCOPY WITH ESOPHAGOGASTRODUODENOSCOPY (EGD) N/A 01/14/2013   Procedure: COLONOSCOPY WITH ESOPHAGOGASTRODUODENOSCOPY (EGD);  Surgeon: Rogene Houston, MD;  [facility];  Service: Endoscopy;  Laterality: N/A;  250-moved to 315 Ann to notify pt  . COLOSTOMY Left 07/26/2013   Procedure: COLOSTOMY;  Surgeon: Jamesetta So, MD;  Location: [facility];  Service: General;  Laterality: Left;  . EUS N/A 02/18/2013   Procedure: LOWER ENDOSCOPIC ULTRASOUND (EUS);  Surgeon: Milus Banister, MD;  Location: [facility];  Service: Endoscopy;  Laterality: N/A;  . FLEXIBLE SIGMOIDOSCOPY N/A 07/26/2013   Procedure: FLEXIBLE SIGMOIDOSCOPY;  Surgeon: Jamesetta So, MD;  [facility]: [facility];  Service: General;  Laterality: N/A;  . LYMPH NODE DISSECTION Left 10/26/2014   Procedure: LYMPH NODE DISSECTION;  Surgeon: [name], MD;  Location: [place];  Service: Thoracic;  Laterality: Left;  [name] MULTIPLE EXTRACTIONS WITH ALVEOLOPLASTY N/A 11/23/2012   Procedure: MULTIPLE EXTRACION 1, 2, 4, 5, 6, 7, 8, 9, 10, 11, 12, 13, 14, 17, 18, 20, 23, 24, 25, 26, 28, 29, 32 WITH ALVEOLOPLASTY, REMOVE BILATERAL TORI;  Surgeon: Gae Bon, DDS;  Location:  [place];  Service: Oral Surgery;  Laterality: N/A;  . PORTACATH PLACEMENT Left 01/02/2015   Procedure: INSERTION PORT-A-CATH;  Surgeon: Aviva Signs, MD;  [facility];  Service: General;  Laterality: Left;  . SALPINGOOPHORECTOMY Bilateral 07/26/2013   Procedure: SALPINGO OOPHORECTOMY;  Surgeon: Jamesetta So, MD;  [facility];  Service: General;  Laterality: Bilateral;  . SEGMENTECOMY Left 10/26/2014   Procedure: LEFT LOWER LOBE SUPERIOR SEGMENTECTOMY;  Surgeon: [name], MD;  Location: [place];  Service: Thoracic;  Laterality: Left;  . SUPRACERVICAL ABDOMINAL HYSTERECTOMY N/A 07/26/2013   Procedure: HYSTERECTOMY SUPRACERVICAL ABDOMINAL ;  Surgeon: Jamesetta So, MD;  [facility];  Service: General;  Laterality: N/A;  . VIDEO ASSISTED THORACOSCOPY Left 10/26/2014   Procedure: LEFT VIDEO ASSISTED THORACOSCOPY;  Surgeon: [name], MD;  Location: [place];  Service: Thoracic;  Laterality: Left;    History reviewed. No pertinent family history.  Social History   Social History  . Marital status: Single    Spouse name: N/A  . Number of children: N/A  . Years of education: N/A   Social History Main Topics  . Smoking status: Former Smoker    Types: Cigarettes    Quit date: 07/16/2008  . Smokeless tobacco: Never Used     Comment: unknown when stopped smoking      long time  . Alcohol use No  . Drug use: No  . Sexual activity: Not Asked   Other Topics Concern  . None   Social History Narrative  . None     PHYSICAL EXAMINATION  ECOG PERFORMANCE STATUS: 2 - Symptomatic, <50% confined to bed  Vitals:   01/05/16 1313  BP: (!) 144/67  Pulse: 88  Resp: 18  Temp: 99 F (37.2 C)    GENERAL:alert, no distress, well nourished, well developed, comfortable, cooperative, obese, smiling and accompanied by caretaker SKIN: skin color, texture, turgor are normal, no rashes or significant lesions HEAD: Normocephalic, No masses, lesions, tenderness or  abnormalities EYES: normal, EOMI, Conjunctiva are pink and non-injected EARS: External ears normal OROPHARYNX:lips, buccal mucosa, and tongue normal and mucous membranes are moist  NECK: supple, trachea midline LYMPH:  not examined BREAST:not examined LUNGS: clear to auscultation without wheezes, rales, or rhonchi. HEART: regular rate & rhythm without murmur, rub, or gallop. ABDOMEN:abdomen soft, non-tender  and normal bowel sounds BACK: Back symmetric, no curvature. EXTREMITIES:less then 2 second capillary refill, no joint deformities, effusion, or inflammation, no skin discoloration, no cyanosis  NEURO: alert & oriented x 3 with fluent speech, no focal motor/sensory deficits, gait normal   LABORATORY DATA: CBC    Component Value Date/Time   WBC 4.9 01/01/2016 1332   RBC 3.58 (L) 01/01/2016 1332   HGB 9.8 (L) 01/01/2016 1332   HCT 31.2 (L) 01/01/2016 1332   PLT 155 01/01/2016 1332   MCV 87.2 01/01/2016 1332   MCH 27.4 01/01/2016 1332   MCHC 31.4 01/01/2016 1332   RDW 15.9 (H) 01/01/2016 1332   LYMPHSABS 0.7 01/01/2016 1332   MONOABS 0.4 01/01/2016 1332   EOSABS 0.2 01/01/2016 1332   BASOSABS 0.0 01/01/2016 1332      Chemistry      Component Value Date/Time   NA 136 01/01/2016 1332   K 4.7 01/01/2016 1332   CL 101 01/01/2016 1332   CO2 27 01/01/2016 1332   BUN 46 (H) 01/01/2016 1332   CREATININE 1.49 (H) 01/01/2016 1332   CREATININE 1.11 (H) 02/08/2013 1451      Component Value Date/Time   CALCIUM 8.5 (L) 01/01/2016 1332   ALKPHOS 260 (H) 01/01/2016 1332   AST 24 01/01/2016 1332   ALT 27 01/01/2016 1332   BILITOT 0.4 01/01/2016 1332        PENDING LABS:   RADIOGRAPHIC STUDIES:  Ct Chest Wo Contrast  Result Date: 01/03/2016 CLINICAL DATA:  65 year old female with history of rectal carcinoma diagnosed in 2015 with metastatic disease to the lungs. Evaluate pulmonary nodules. EXAM: CT CHEST WITHOUT CONTRAST TECHNIQUE: Multidetector CT imaging of the chest  was performed following the standard protocol without IV contrast. COMPARISON:  Chest CT 08/29/2015. FINDINGS: Cardiovascular: Heart size is normal. There is no significant pericardial fluid, thickening or pericardial calcification. Left subclavian single-lumen porta cath with tip terminating in the innominate vein. Mediastinum/Nodes: No pathologically enlarged mediastinal or hilar lymph nodes. Please note that accurate exclusion of hilar adenopathy is limited on noncontrast CT scans. Esophagus is unremarkable in appearance. No axillary lymphadenopathy. Lungs/Pleura: 3 mm right lower lobe nodule noted on the prior study is stable on today's examination (image 57 of series 3). Small area of peripheral architectural distortion in the right upper lobe, similar to prior examinations, presumably post infectious/inflammatory scarring. No new suspicious appearing pulmonary nodules or masses are noted. No acute consolidative airspace disease. No pleural effusions. Postoperative changes of wedge resection in the left lower lobe are again noted with some mild chronic postoperative scarring. Upper Abdomen: Scarring in the upper pole the left kidney, similar prior examinations. Musculoskeletal: Diffuse idiopathic skeletal hyperostosis again noted. 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 IMPRESSION: 1. No definite findings to suggest metastatic disease to the lungs. 2. Previously noted 3 mm right lower lobe pulmonary nodule stable, favored to be benign. 3. Additional incidental findings, similar prior studies, as above. Electronically Signed   By: Vinnie Langton M.D.   On: 01/03/2016 15:02   Dg Hand Complete Left  Result Date: 12/13/2015 CLINICAL DATA:  Direct trauma to the hand 3 days ago with persistent pain and swelling especially over the second metacarpophalangeal joint. EXAM: LEFT HAND - COMPLETE 3+ VIEW COMPARISON:   Left hand series dated May 20, 2013 FINDINGS: The bones are subjectively adequately mineralized. There is no acute fracture nor dislocation. There is no lytic nor blastic lesion.  There is narrowing of the interphalangeal joints of the fifth digit. The MCP joint spaces are preserved. Small amounts of soft tissue calcification are present associated with second and third MCP joints. There are no erosive changes. The metacarpals are intact. There is chronic widening of the scapholunate interval. IMPRESSION: There is soft tissue swelling centered over the index finger and second MCP joint region. There is no acute fracture or dislocation. Osteoarthritic changes are present as described. Electronically Signed   By: David  Martinique M.D.   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 Roxan Hockey on 10/26/2014 demonstrating recurrent Stage IV disease after undergoing Xeloda+XRT finishing on 05/04/2013, followed by APR on 07/26/2013 by Dr. Arnoldo Morale with marked cytoreduction documented, followed by 6 months worth of Xeloda 1000 mg BID 2 weeks on and 1 week off x 6 months.  Surveillance per NCCN guidelines was followed with serial CT imaging of chest demonstrating an enlarging pulmonary nodule as mentioned previously.  S/P 12 cycles of FOLFOX + Avastin (01/04/2015- 06/27/2015).  Oncology history is updated.  Labs today: CBC diff, CMET.  I personally reviewed and went over laboratory results with the patient.  The results are noted within this dictation.  I personally reviewed and went over radiographic studies with the patient.  The results are noted within this dictation.  CT imaging of chest is negative for any findings concerning for malignancy.  B12 injection is due today next month and monthly.    Labs in 8 weeks: CBC diff, CMET, CEA, anemia  panel.  Influenza vaccine given today.  Return for follow-up in 2 months.  She will be due for CT chest/abd/pelvis imaging in June 2018.   ORDERS PLACED FOR THIS ENCOUNTER: Orders Placed This Encounter  Procedures  . CT Abdomen Pelvis W Contrast  . CT Chest W Contrast  . CBC with Differential  . Comprehensive metabolic panel  . CEA  . Vitamin B12  . Folate  . Iron and TIBC  . Ferritin    MEDICATIONS PRESCRIBED THIS ENCOUNTER: Meds ordered this encounter  Medications  . Influenza vac split quadrivalent PF (FLUARIX) injection 0.5 mL    THERAPY PLAN:  Continue with B12 injections monthly and surveillance of pulmonary nodule.  NCCN guidelines regarding surveillance for rectal cancer are as follows (3. 2017):  1. Stage I with full surgical staging:   A. Colonoscopy 1 year    1. If advanced adenoma, repeat in 1 year.    2. If no advanced adenoma, repeat in 3 years, then every 5 years  2. Stage II, III:   A. History and physical every 3-6 months for 2 years, then every 6 months for a total of 5 years.   B. CEA every 3-6 months for 2 years, then every 6 months for a total of 5 years.   C. Chest/abdominal/pelvic CT every 6-12 months (category 2b for frequency less than 12 months) for a total of 5 years.   D. Colonoscopy in 1 year except if no preoperative colonoscopy due to obstructing lesion, colonoscopy in 3-6 months.    1. If advanced adenoma, repeat in 1 year    2. If no advanced adenoma, repeat in 3 years, then every 5 years.   E. Proctoscopy (with EUS or MRI with contrast) every 3-6 months for the first 2 years, then every 6 months  for total of 5 years (for patients treated with transanal excision only).   F. PET/CT scan is not recommended.  3. Stage IV:   A. History and physical every 3-6 months for 2 years, then every 6 months for a total of 5 years.   B. CEA every 3-6 months for 2 years, then every 6 months for a total of 5 years.   C. Chest/abdominal/pelvic CT every  3-6 months (category 2b for frequency less than 6 months) for 2 years, then every 6-12 months for a total of 5 years.   D. Colonoscopy in 1 year except if no preoperative colonoscopy due to obstructing lesion, colonoscopy in 3-6 months.    1. If advanced adenoma, repeat in 1 year    2. If no advanced adenoma, repeat in 3 years, then every 5 years.   E. Proctoscopy (with EUS or MRI with contrast) every 3-6 months for the first 2 years, then every 6 months for total of 5 years (for patients treated with transanal excision only).   F. PET/CT scan is not recommended.          All questions were answered. The patient knows to call the clinic with any problems, questions or concerns. We can certainly see the patient much sooner if necessary.  Patient and plan discussed with Dr. Ancil Linsey and she is in agreement with the aforementioned.   This note is electronically signed by: Doy Mince 01/05/2016 2:12 PM

## 2016-01-05 NOTE — Progress Notes (Signed)
[name] presents today for injection per MD orders. Flu Vaccine administered IM in left Upper Arm. Administration without incident. Patient tolerated well.

## 2016-01-29 ENCOUNTER — Encounter (HOSPITAL_COMMUNITY): Payer: Medicare Other | Attending: Hematology & Oncology

## 2016-01-29 VITALS — BP 160/54 | HR 61 | Temp 97.7°F | Resp 20

## 2016-01-29 DIAGNOSIS — E538 Deficiency of other specified B group vitamins: Secondary | ICD-10-CM

## 2016-01-29 MED ORDER — CYANOCOBALAMIN 1000 MCG/ML IJ SOLN
INTRAMUSCULAR | Status: AC
  Filled 2016-01-29: qty 1

## 2016-01-29 MED ORDER — CYANOCOBALAMIN 1000 MCG/ML IJ SOLN
1000.0000 ug | Freq: Once | INTRAMUSCULAR | Status: AC
  Administered 2016-01-29: 1000 ug via INTRAMUSCULAR

## 2016-01-29 NOTE — Patient Instructions (Signed)
[facility] at [facility] Discharge Instructions  RECOMMENDATIONS MADE BY THE CONSULTANT AND ANY TEST RESULTS WILL BE SENT TO YOUR REFERRING PHYSICIAN.  Vitamin B12 1000 mcg injection given today as ordered. Return as scheduled.  Thank you for choosing [facility] at [facility] to provide your oncology and hematology care.  To afford each patient quality time with our provider, please arrive at least 15 minutes before your scheduled appointment time.   Beginning January 23rd 2017 lab work for the Cancer Center will be done in the  Main lab at [place] on 1st floor. If you have a lab appointment with the Cancer Center please come in thru the  Main Entrance and check in at the main information desk  You need to re-schedule your appointment should you arrive 10 or more minutes late.  We strive to give you quality time with our providers, and arriving late affects you and other patients whose appointments are after yours.  Also, if you no show three or more times for appointments you may be dismissed from the clinic at the providers discretion.     Again, thank you for choosing [facility].  Our hope is that these requests will decrease the amount of time that you wait before being seen by our physicians.       _____________________________________________________________  Should you have questions after your visit to [facility], please contact our office at [phone] between the hours of 8:30 a.m. and 4:30 p.m.  Voicemails left after 4:30 p.m. will not be returned until the following business day.  For prescription refill requests, have your pharmacy contact our office.         Resources For Cancer Patients and their Caregivers ? American Cancer Society: Can assist with transportation, wigs, general needs, runs Look Good Feel Better.        1-888-227-6333 ? Cancer Care: Provides financial assistance, online  support groups, medication/co-pay assistance.  1-800-813-HOPE (4673) ? [facility] Assists [place] cancer patients and their families through emotional , educational and financial support.  336-427-4357 ? [place] DSS Where to apply for food stamps, Medicaid and utility assistance. 336-342-1394 ? [facility]: Transportation to medical appointments. 336-347-2287 ? Social Security Administration: May apply for disability if have a Stage IV cancer. 336-342-7796 1-800-772-1213 ? [place] Aging, Disability and Transit Services: Assists with nutrition, care and transit needs. 336-349-2343  Cancer Center Support Programs: @10RELATIVEDAYS@ > Cancer Support Group  2nd Tuesday of the month 1pm-2pm, Journey Room  > Creative Journey  3rd Tuesday of the month 1130am-1pm, Journey Room  > Look Good Feel Better  1st Wednesday of the month 10am-12 noon, Journey Room (Call American Cancer Society to register 1-800-395-5775)   

## 2016-01-29 NOTE — Progress Notes (Signed)
[name] presents today for injection per MD orders. B12 1000 mcg administered IM in left Upper Arm. Administration without incident. Patient tolerated well.

## 2016-02-23 ENCOUNTER — Ambulatory Visit (HOSPITAL_COMMUNITY): Payer: Medicare Other | Admitting: Oncology

## 2016-02-26 ENCOUNTER — Encounter (HOSPITAL_COMMUNITY): Payer: Self-pay | Admitting: Oncology

## 2016-02-26 ENCOUNTER — Encounter (HOSPITAL_COMMUNITY): Payer: Medicare Other | Attending: Hematology and Oncology

## 2016-02-26 ENCOUNTER — Encounter (HOSPITAL_BASED_OUTPATIENT_CLINIC_OR_DEPARTMENT_OTHER): Payer: Medicare Other | Admitting: Oncology

## 2016-02-26 VITALS — BP 178/78 | HR 68 | Temp 98.3°F | Resp 18

## 2016-02-26 DIAGNOSIS — D631 Anemia in chronic kidney disease: Secondary | ICD-10-CM

## 2016-02-26 DIAGNOSIS — C7800 Secondary malignant neoplasm of unspecified lung: Secondary | ICD-10-CM | POA: Diagnosis not present

## 2016-02-26 DIAGNOSIS — E538 Deficiency of other specified B group vitamins: Secondary | ICD-10-CM

## 2016-02-26 DIAGNOSIS — C20 Malignant neoplasm of rectum: Secondary | ICD-10-CM | POA: Diagnosis present

## 2016-02-26 DIAGNOSIS — D649 Anemia, unspecified: Secondary | ICD-10-CM | POA: Diagnosis present

## 2016-02-26 DIAGNOSIS — N183 Chronic kidney disease, stage 3 unspecified: Secondary | ICD-10-CM

## 2016-02-26 LAB — COMPREHENSIVE METABOLIC PANEL
ALK PHOS: 300 U/L — AB (ref 38–126)
ALT: 15 U/L (ref 14–54)
AST: 19 U/L (ref 15–41)
Albumin: 3.8 g/dL (ref 3.5–5.0)
Anion gap: 8 (ref 5–15)
BUN: 44 mg/dL — AB (ref 6–20)
CALCIUM: 9.4 mg/dL (ref 8.9–10.3)
CHLORIDE: 98 mmol/L — AB (ref 101–111)
CO2: 29 mmol/L (ref 22–32)
CREATININE: 1.42 mg/dL — AB (ref 0.44–1.00)
GFR calc Af Amer: 44 mL/min — ABNORMAL LOW (ref >60)
GFR, EST NON AFRICAN AMERICAN: 38 mL/min — AB (ref >60)
Glucose, Bld: 74 mg/dL (ref 65–99)
Potassium: 4.9 mmol/L (ref 3.5–5.1)
SODIUM: 135 mmol/L (ref 135–145)
Total Bilirubin: 0.4 mg/dL (ref 0.3–1.2)
Total Protein: 7.2 g/dL (ref 6.5–8.1)

## 2016-02-26 LAB — CBC WITH DIFFERENTIAL/PLATELET
BASOS ABS: 0 10*3/uL (ref 0.0–0.1)
Basophils Relative: 0 %
EOS PCT: 5 %
Eosinophils Absolute: 0.3 10*3/uL (ref 0.0–0.7)
HCT: 31.2 % — ABNORMAL LOW (ref 36.0–46.0)
HEMOGLOBIN: 10 g/dL — AB (ref 12.0–15.0)
LYMPHS PCT: 18 %
Lymphs Abs: 0.9 10*3/uL (ref 0.7–4.0)
MCH: 29 pg (ref 26.0–34.0)
MCHC: 32.1 g/dL (ref 30.0–36.0)
MCV: 90.4 fL (ref 78.0–100.0)
Monocytes Absolute: 0.4 10*3/uL (ref 0.1–1.0)
Monocytes Relative: 7 %
NEUTROS PCT: 70 %
Neutro Abs: 3.6 10*3/uL (ref 1.7–7.7)
PLATELETS: 176 10*3/uL (ref 150–400)
RBC: 3.45 MIL/uL — AB (ref 3.87–5.11)
RDW: 14.9 % (ref 11.5–15.5)
WBC: 5.2 10*3/uL (ref 4.0–10.5)

## 2016-02-26 LAB — IRON AND TIBC
Iron: 74 ug/dL (ref 28–170)
SATURATION RATIOS: 27 % (ref 10.4–31.8)
TIBC: 272 ug/dL (ref 250–450)
UIBC: 198 ug/dL

## 2016-02-26 LAB — FOLATE: Folate: 19.3 ng/mL (ref >5.9)

## 2016-02-26 LAB — FERRITIN: Ferritin: 109 ng/mL (ref 11–307)

## 2016-02-26 LAB — VITAMIN B12: Vitamin B-12: >7500 pg/mL — ABNORMAL HIGH (ref 180–914)

## 2016-02-26 MED ORDER — HEPARIN SOD (PORK) LOCK FLUSH 100 UNIT/ML IV SOLN
INTRAVENOUS | Status: AC
  Filled 2016-02-26: qty 5

## 2016-02-26 MED ORDER — CYANOCOBALAMIN 1000 MCG/ML IJ SOLN
INTRAMUSCULAR | Status: AC
  Filled 2016-02-26: qty 1

## 2016-02-26 MED ORDER — CYANOCOBALAMIN 1000 MCG/ML IJ SOLN
1000.0000 ug | Freq: Once | INTRAMUSCULAR | Status: AC
  Administered 2016-02-26: 1000 ug via INTRAMUSCULAR

## 2016-02-26 MED ORDER — HEPARIN SOD (PORK) LOCK FLUSH 100 UNIT/ML IV SOLN
500.0000 [IU] | Freq: Once | INTRAVENOUS | Status: AC
  Administered 2016-02-26: 500 [IU] via INTRAVENOUS

## 2016-02-26 NOTE — Patient Instructions (Addendum)
[place] at [facility] Discharge Instructions  RECOMMENDATIONS MADE BY THE CONSULTANT AND ANY TEST RESULTS WILL BE SENT TO YOUR REFERRING PHYSICIAN.  You saw Kirby Crigler, PA-C, today. B12 injections monthly. Port flushes every 8 weeks. Follow up with Tom with labs in 3 months. See Amy at checkout for appointments.  Thank you for choosing [place] at [facility] to provide your oncology and hematology care.  To afford each patient quality time with our provider, please arrive at least 15 minutes before your scheduled appointment time.   Beginning January 23rd 2017 lab work for the [facility] will be done in the  Main lab at [facility] on 1st floor. If you have a lab appointment with the [place] please come in thru the  Main Entrance and check in at the main information desk  You need to re-schedule your appointment should you arrive 10 or more minutes late.  We strive to give you quality time with our providers, and arriving late affects you and other patients whose appointments are after yours.  Also, if you no show three or more times for appointments you may be dismissed from the clinic at the providers discretion.     Again, thank you for choosing [facility].  Our hope is that these requests will decrease the amount of time that you wait before being seen by our physicians.       _____________________________________________________________  Should you have questions after your visit to [facility], please contact our office at [phone] between the hours of 8:30 a.m. and 4:30 p.m.  Voicemails left after 4:30 p.m. will not be returned until the following business day.  For prescription refill requests, have your pharmacy contact our office.         Resources For Cancer Patients and their Caregivers ? American Cancer Society: Can assist with transportation, wigs, general needs, runs Look Good  Feel Better.        719-765-5302 ? Cancer Care: Provides financial assistance, online support groups, medication/co-pay assistance.  1-800-813-HOPE 434-091-6995) ? [place] Assists [place] cancer patients and their families through emotional , educational and financial support.  267-243-2918 ? [place] DSS Where to apply for food stamps, Medicaid and utility assistance. ([phone] ? RCATS: Transportation to medical appointments. 916-696-3125 ? Social Security Administration: May apply for disability if have a Stage IV cancer. [phone] ? [employer], Disability and Transit Services: Assists with nutrition, care and transit needs. [place] Support Programs: '@10RELATIVEDAYS'$ @ > Cancer Support Group  2nd Tuesday of the month 1pm-2pm, Journey Room  > Creative Journey  3rd Tuesday of the month 1130am-1pm, Journey Room  > Look Good Feel Better  1st Wednesday of the month 10am-12 noon, Journey Room (Call [place] to register [phone])

## 2016-02-26 NOTE — Progress Notes (Signed)
[name] presented for Portacath access and flush. Portacath located lt chest wall accessed with  H 20 needle. Good blood return present. Portacath flushed with 41m NS and 500U/560mHeparin and needle removed intact. Procedure without incident. Patient tolerated procedure well.  Jennifer Verrastroresents today for injection per MD orders. B12 1000 administered IM in right Upper Arm. Administration without incident. Patient tolerated well.

## 2016-02-26 NOTE — Progress Notes (Signed)
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 female returns for followup of Locally advanced adenocarcinoma of the rectum, She tolerated combined modality therapy well. Radiation therapy completed on 05/04/2013 which was also the last day she received Xeloda, excellent tolerance, status post APR on 07/26/2013 with marked cytoreduction documented as evidenced by no lymph node involvement. pT3 tumor was found. S/P 6 months of Xeloda at 1000 mg BID 2 weeks on and 1 week off finishing in December 2015. Serial CT imaging demonstrated an enlarging pulmonary nodule resulting in LLL superior segmentectomy by Dr. Roxan Hockey on 10/26/2014 demonstrating oligometastatic disease of rectal adenocarcinoma, resulting in an upstage to Stage IV disease.  She is now S/P 12 cycles of FOLFOX + Avastin 01/04/2015- 06/27/2015.    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      03/25/2013 - 05/05/2013 Chemotherapy    Xeloda 1500 mg BID 5 days/week with radiation therapy.      07/26/2013 Definitive Surgery    Dr. Arnoldo Morale- Flexible sigmoidoscopy, abdominoperineal resection, total abdominal hysterectomy with bilateral salpingo-oophorectomy      08/30/2013 - 03/01/2014 Chemotherapy    Xeloda 1000 mg BID 14 days on and 7 days off x 6 months      03/08/2014 Imaging    CT CAP- Bilateral pulmonary nodules, including an 8 mm left lower lobe nodule. Metastatic disease is a concern. The left lower lobe nodule has progressed since 03/05/2013, when it measured 5 mm.      05/26/2014 Imaging    CT Chest- Continued  enlargement of the superior segment left lower lobe nodule. Malignancy is likely.   In contrast, the right lower lobe nodule is probably benign.      08/17/2014 Imaging    CT- Superior segment left lower lobe pulmonary nodule measures stable since the most recent comparison study, but is again noted to have increased in size when comparing to older studies. Continued close attention will be required as neoplasm remains a con      10/26/2014 Surgery    thoracoscopic left lower lobe superior segmentectomy. Pathology with metastatic adenocarcinoma c/w colonic primary      01/04/2015 - 06/27/2015 Chemotherapy    FOLFOX+Avastin (Avastin started on 01/18/2015).      01/09/2015 Imaging    CT CAP- Interval wedge resection of metastasis within the left lower lobe. No evidence of new metastatic disease within the chest, abdomen or pelvis.      02/15/2015 Treatment Plan Change    Treatment deferred due to renal function change (patient poor historian) with N/V.      02/22/2015 Treatment Plan Change    Added Aranesp at renal dosing to supportive therpay plan.      03/08/2015 Treatment Plan Change    Defer treatment x 1 week      04/12/2015 Adverse Reaction    Patient reported mouth sores.  None on exam.        04/12/2015 Treatment Plan Change    5FU bolus is discontinued for "mouth sores"      04/12/2015 Adverse Reaction    Increasing Creatinine Cl.  CrCl calculated to be 30.7.        04/12/2015 Treatment Plan Change    Oxaliplatin dose reduced by 20% to 65 mg/m2 based upon creatinine clearance.      05/24/2015 Treatment Plan Change    5FU CI decreased by 15%.      08/29/2015 Imaging    CT CAP- Status post APR with left lower quadrant colostomy, and some stable presacral scarring. No definite evidence of metastatic disease in the CAP.  There is a new 3 mm nodule in the right lower lobe which is highly nonspecific. Attention on follow-up.      01/03/2016 Imaging    CT chest- No definite  findings to suggest metastatic disease to the lungs. Previously noted 3 mm right lower lobe pulmonary nodule stable, favored to be benign.       She is doing well.  She denies any complaints today.  Her appetite is strong.  No blood in stools or dark stools.  She denies any BM changes.  Denies SOB, cough or hemoptysis.  Review of Systems  Constitutional: Negative.  Negative for chills and fever.  HENT: Negative.   Eyes: Negative.  Negative for double vision.  Respiratory: Negative.  Negative for cough.   Cardiovascular: Negative.  Negative for chest pain.  Gastrointestinal: Negative.  Negative for abdominal pain, blood in stool, constipation, diarrhea, melena, nausea and vomiting.  Genitourinary: Negative.  Negative for dysuria.  Musculoskeletal: Negative.  Negative for falls.  Skin: Negative.   Neurological: Negative.  Negative for weakness.  Endo/Heme/Allergies: Negative.   Psychiatric/Behavioral: Negative.     Past Medical History:  Diagnosis Date  . Arthritis   . Asthma   . Cancer (HCC)    Rectal   . Chronic renal disease, stage 3, moderately decreased glomerular filtration rate between 30-59 mL/min/1.73 square meter 04/27/2014  . Depression   . Diabetes mellitus    years  . GERD (gastroesophageal reflux disease)   . Gout   . History of recurrent UTIs   . Hypertension   . Iron deficiency 04/27/2014  . Mental retardation    stopped school at 9th grade   . Numbness and tingling in hands    x several months   . Rectal cancer ([place])   . Seizures ([place])    more than 4 yrs since last seizure. UNknown etiology  . Shortness of breath    with exertion  . Vitamin B 12 deficiency 02/01/2015   Incidentally found without antibody testing.      Past Surgical History:  Procedure Laterality Date  . ABDOMINAL HYSTERECTOMY    . ABDOMINAL PERINEAL BOWEL RESECTION N/A 07/26/2013   Procedure:  ABDOMINAL PERINEAL RESECTION;  Surgeon: Jamesetta So, MD;  Location: [facility];  Service:  General;  Laterality: N/A;  . COLON SURGERY  2015   bowel resection/w colostomy  . COLONOSCOPY N/A 02/04/2013   Procedure: COLONOSCOPY;  Surgeon: Rogene Houston, MD;  Location: [facility];  Service: Endoscopy;  Laterality: N/A;  225  . COLONOSCOPY N/A 05/12/2014   Procedure: COLONOSCOPY;  Surgeon: Rogene Houston, MD;  [facility];  Service: Endoscopy;  Laterality: N/A;  1030  . COLONOSCOPY WITH ESOPHAGOGASTRODUODENOSCOPY (EGD) N/A 01/14/2013   Procedure: COLONOSCOPY WITH ESOPHAGOGASTRODUODENOSCOPY (EGD);  Surgeon: Rogene Houston, MD;  [facility];  Service: Endoscopy;  Laterality: N/A;  250-moved to 315 Ann to notify pt  . COLOSTOMY Left 07/26/2013   Procedure: COLOSTOMY;  Surgeon: Jamesetta So, MD;  Location: [facility];  Service: General;  Laterality: Left;  . EUS N/A 02/18/2013   Procedure: LOWER ENDOSCOPIC ULTRASOUND (EUS);  Surgeon: Milus Banister, MD;  Location: [facility];  Service: Endoscopy;  Laterality: N/A;  . FLEXIBLE SIGMOIDOSCOPY N/A 07/26/2013   Procedure: FLEXIBLE SIGMOIDOSCOPY;  Surgeon: Jamesetta So, MD;  Location: [facility];  Service: General;  Laterality: N/A;  . LYMPH NODE DISSECTION Left 10/26/2014   Procedure: LYMPH NODE DISSECTION;  Surgeon: [name], MD;  Location: [facility];  Service: Thoracic;  Laterality: Left;  [name] MULTIPLE EXTRACTIONS WITH ALVEOLOPLASTY N/A 11/23/2012   Procedure: MULTIPLE EXTRACION 1, 2, 4, 5, 6, 7, 8, 9, 10, 11, 12, 13, 14, 17, 18, 20, 23, 24, 25, 26, 28, 29, 32 WITH ALVEOLOPLASTY, REMOVE BILATERAL TORI;  Surgeon: Gae Bon, DDS;  Location: [facility];  Service: Oral Surgery;  Laterality: N/A;  . PORTACATH PLACEMENT Left 01/02/2015   Procedure: INSERTION PORT-A-CATH;  Surgeon: Aviva Signs, MD;  Location: [facility];  Service: General;  Laterality: Left;  . SALPINGOOPHORECTOMY Bilateral 07/26/2013   Procedure: SALPINGO OOPHORECTOMY;  Surgeon: Jamesetta So, MD;  Location: [facility];  Service: General;  Laterality: Bilateral;   . SEGMENTECOMY Left 10/26/2014   Procedure: LEFT LOWER LOBE SUPERIOR SEGMENTECTOMY;  Surgeon: [name], MD;  Location: [facility];  Service: Thoracic;  Laterality: Left;  . SUPRACERVICAL ABDOMINAL HYSTERECTOMY N/A 07/26/2013   Procedure: HYSTERECTOMY SUPRACERVICAL ABDOMINAL ;  Surgeon: Jamesetta So, MD;  Location: [facility];  Service: General;  Laterality: N/A;  . VIDEO ASSISTED THORACOSCOPY Left 10/26/2014   Procedure: LEFT VIDEO ASSISTED THORACOSCOPY;  Surgeon: [name], MD;  Location: [facility];  Service: Thoracic;  Laterality: Left;    History reviewed. No pertinent family history.  Social History   Social History  . Marital status: Single    Spouse name: N/A  . Number of children: N/A  . Years of education: N/A   Social History Main Topics  . Smoking status: Former Smoker    Types: Cigarettes    Quit date: 07/16/2008  . Smokeless tobacco: Never Used     Comment: unknown when stopped smoking      long time  . Alcohol use No  . Drug use: No  . Sexual activity: Not Asked   Other Topics Concern  . None   Social History Narrative  . None     PHYSICAL EXAMINATION  ECOG PERFORMANCE STATUS: 2 - Symptomatic, <50% confined to bed  There were no vitals filed for this visit.  Vitals - 1 value per visit 28/41/3244  SYSTOLIC 010  DIASTOLIC 78  Pulse 68  Temperature 98.3  Respirations 18   GENERAL:alert, no distress, well nourished, well developed, comfortable, cooperative, obese, smiling and accompanied by caretaker, in wheelchair. SKIN: skin color, texture, turgor are normal, no rashes or significant lesions HEAD: Normocephalic, No masses, lesions, tenderness or abnormalities EYES: normal, EOMI, Conjunctiva are pink and non-injected EARS: External ears normal OROPHARYNX:lips, buccal mucosa, and tongue normal and mucous membranes are moist  NECK: supple, trachea midline LYMPH:  not examined BREAST:not examined LUNGS: clear to auscultation without wheezes,  rales, or rhonchi. HEART: regular rate & rhythm without murmur, rub, or gallop. ABDOMEN:abdomen soft, non-tender and normal bowel sounds BACK: Back symmetric, no curvature. EXTREMITIES:less then 2 second capillary refill, no joint deformities, effusion, or inflammation, no skin discoloration, no cyanosis.  B/L LE edema NEURO: alert & oriented x 3 with fluent speech, no focal motor/sensory deficits  LABORATORY DATA:  CBC    Component Value Date/Time   WBC 4.9 01/01/2016 1332   RBC 3.58 (L) 01/01/2016 1332   HGB 9.8 (L) 01/01/2016 1332   HCT 31.2 (L) 01/01/2016 1332   PLT 155 01/01/2016 1332   MCV 87.2 01/01/2016 1332   MCH 27.4 01/01/2016 1332   MCHC 31.4 01/01/2016 1332   RDW 15.9 (H) 01/01/2016 1332   LYMPHSABS 0.7 01/01/2016 1332   MONOABS 0.4 01/01/2016 1332   EOSABS 0.2 01/01/2016 1332   BASOSABS 0.0 01/01/2016 1332      Chemistry      Component Value Date/Time   NA 136 01/01/2016 1332   K 4.7 01/01/2016 1332   CL 101 01/01/2016 1332   CO2 27 01/01/2016 1332   BUN 46 (H) 01/01/2016 1332   CREATININE 1.49 (H) 01/01/2016 1332   CREATININE 1.11 (H) 02/08/2013 1451      Component Value Date/Time   CALCIUM 8.5 (L) 01/01/2016 1332   ALKPHOS 260 (H) 01/01/2016 1332   AST 24 01/01/2016 1332   ALT 27 01/01/2016 1332   BILITOT 0.4 01/01/2016 1332      Lab Results  Component Value Date   CEA 4.3 12/01/2015     PENDING LABS:   RADIOGRAPHIC STUDIES:  No results found.   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 Roxan Hockey on 10/26/2014 demonstrating recurrent Stage IV disease after undergoing Xeloda+XRT finishing on 05/04/2013, followed by APR on 07/26/2013 by Dr. Arnoldo Morale with marked cytoreduction documented, followed by 6 months worth of Xeloda 1000 mg BID 2 weeks on and 1 week off x 6 months.  Surveillance per NCCN  guidelines was followed with serial CT imaging of chest demonstrating an enlarging pulmonary nodule as mentioned previously.  S/P 12 cycles of FOLFOX + Avastin (01/04/2015- 06/27/2015).  Oncology history is updated.  Labs today: CBC diff, CMET, CEA, anemia panel.  I personally reviewed and went over laboratory results with the patient.  The results are noted within this dictation.  Continue monthly B12 injections.  Labs in 12 weeks: CBC diff, CMET, CEA.  Return for follow-up in 3 months.  She will be due for CT chest/abd/pelvis imaging in June 2018.   ORDERS PLACED FOR THIS ENCOUNTER: Orders Placed This Encounter  Procedures  . CBC with Differential  . Comprehensive metabolic panel  . CEA    MEDICATIONS PRESCRIBED THIS ENCOUNTER: No orders of the defined types were placed in this encounter.   THERAPY PLAN:  Continue with B12 injections monthly and surveillance of pulmonary nodule.  NCCN guidelines regarding surveillance for rectal cancer are as follows (3. 2017):  1. Stage I with full surgical staging:   A. Colonoscopy 1 year    1. If advanced adenoma, repeat in 1 year.    2. If no advanced adenoma, repeat in 3 years, then every 5 years  2. Stage II, III:   A. History and physical every 3-6 months for 2 years, then every 6 months for a total of 5 years.   B. CEA every 3-6 months for 2 years, then every 6 months for a total of 5 years.   C. Chest/abdominal/pelvic CT every 6-12 months (category 2b for frequency less than 12 months) for a total of 5 years.   D. Colonoscopy in 1 year except if no preoperative colonoscopy due to obstructing lesion, colonoscopy in 3-6 months.    1.  If advanced adenoma, repeat in 1 year    2. If no advanced adenoma, repeat in 3 years, then every 5 years.   E. Proctoscopy (with EUS or MRI with contrast) every 3-6 months for the first 2 years, then every 6 months for total of 5 years (for patients treated with transanal excision only).   F. PET/CT scan  is not recommended.  3. Stage IV:   A. History and physical every 3-6 months for 2 years, then every 6 months for a total of 5 years.   B. CEA every 3-6 months for 2 years, then every 6 months for a total of 5 years.   C. Chest/abdominal/pelvic CT every 3-6 months (category 2b for frequency less than 6 months) for 2 years, then every 6-12 months for a total of 5 years.   D. Colonoscopy in 1 year except if no preoperative colonoscopy due to obstructing lesion, colonoscopy in 3-6 months.    1. If advanced adenoma, repeat in 1 year    2. If no advanced adenoma, repeat in 3 years, then every 5 years.   E. Proctoscopy (with EUS or MRI with contrast) every 3-6 months for the first 2 years, then every 6 months for total of 5 years (for patients treated with transanal excision only).   F. PET/CT scan is not recommended.   All questions were answered. The patient knows to call the clinic with any problems, questions or concerns. We can certainly see the patient much sooner if necessary.  Patient and plan discussed with Dr. Ancil Linsey and she is in agreement with the aforementioned.   This note is electronically signed by: Doy Mince 02/26/2016 10:59 AM

## 2016-02-26 NOTE — Assessment & Plan Note (Signed)
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 Roxan Hockey on 10/26/2014 demonstrating recurrent Stage IV disease after undergoing Xeloda+XRT finishing on 05/04/2013, followed by APR on 07/26/2013 by Dr. Arnoldo Morale with marked cytoreduction documented, followed by 6 months worth of Xeloda 1000 mg BID 2 weeks on and 1 week off x 6 months.  Surveillance per NCCN guidelines was followed with serial CT imaging of chest demonstrating an enlarging pulmonary nodule as mentioned previously.  S/P 12 cycles of FOLFOX + Avastin (01/04/2015- 06/27/2015).  Oncology history is updated.  Labs today: CBC diff, CMET, CEA, anemia panel.  I personally reviewed and went over laboratory results with the patient.  The results are noted within this dictation.  Continue monthly B12 injections.  Labs in 12 weeks: CBC diff, CMET, CEA.  Return for follow-up in 3 months.  She will be due for CT chest/abd/pelvis imaging in June 2018.

## 2016-02-27 ENCOUNTER — Ambulatory Visit (INDEPENDENT_AMBULATORY_CARE_PROVIDER_SITE_OTHER): Payer: Medicare Other | Admitting: Podiatry

## 2016-02-27 DIAGNOSIS — M79609 Pain in unspecified limb: Secondary | ICD-10-CM

## 2016-02-27 DIAGNOSIS — B351 Tinea unguium: Secondary | ICD-10-CM

## 2016-02-27 DIAGNOSIS — L603 Nail dystrophy: Secondary | ICD-10-CM

## 2016-02-27 DIAGNOSIS — L608 Other nail disorders: Secondary | ICD-10-CM

## 2016-02-27 LAB — CEA: CEA: 5 ng/mL — AB (ref 0.0–4.7)

## 2016-02-27 NOTE — Progress Notes (Signed)
SUBJECTIVE Patient  presents to office today complaining of elongated, thickened nails. Pain while ambulating in shoes. Patient is unable to trim their own nails.   OBJECTIVE General Patient is awake, alert, and oriented x 3 and in no acute distress. Derm Skin is dry and supple bilateral. Negative open lesions or macerations. Remaining integument unremarkable. Nails are tender, long, thickened and dystrophic with subungual debris, consistent with onychomycosis, 1-5 bilateral. No signs of infection noted. Vasc  DP and PT pedal pulses palpable bilaterally. Temperature gradient within normal limits.  Neuro Epicritic and protective threshold sensation diminished bilaterally.  Musculoskeletal Exam No symptomatic pedal deformities noted bilateral. Muscular strength within normal limits.  ASSESSMENT 1. Onychodystrophic nails 1-5 bilateral with hyperkeratosis of nails.  2. Onychomycosis of nail due to dermatophyte bilateral 3. Pain in foot bilateral  PLAN OF CARE 1. Patient evaluated today.  2. Instructed to maintain good pedal hygiene and foot care.  3. Mechanical debridement of nails 1-5 bilaterally performed using a nail nipper. Filed with dremel without incident.  4. Return to clinic in 3 mos.    Edrick Kins, DPM

## 2016-03-29 ENCOUNTER — Encounter (HOSPITAL_COMMUNITY): Payer: Self-pay

## 2016-03-29 ENCOUNTER — Encounter (HOSPITAL_COMMUNITY): Payer: Medicare Other | Attending: Hematology and Oncology

## 2016-03-29 VITALS — BP 150/66 | HR 81 | Temp 98.0°F | Resp 16

## 2016-03-29 DIAGNOSIS — E538 Deficiency of other specified B group vitamins: Secondary | ICD-10-CM

## 2016-03-29 DIAGNOSIS — C20 Malignant neoplasm of rectum: Secondary | ICD-10-CM | POA: Insufficient documentation

## 2016-03-29 DIAGNOSIS — D649 Anemia, unspecified: Secondary | ICD-10-CM | POA: Insufficient documentation

## 2016-03-29 MED ORDER — CYANOCOBALAMIN 1000 MCG/ML IJ SOLN
INTRAMUSCULAR | Status: AC
  Filled 2016-03-29: qty 1

## 2016-03-29 MED ORDER — CYANOCOBALAMIN 1000 MCG/ML IJ SOLN
1000.0000 ug | Freq: Once | INTRAMUSCULAR | Status: AC
  Administered 2016-03-29: 1000 ug via INTRAMUSCULAR

## 2016-03-29 NOTE — Progress Notes (Signed)
Pt here today for B12 injection. Pt given B12 injection in left deltoid. Pt tolerated well. Pt stable and discharged home in wheelchair with caregiver.

## 2016-03-29 NOTE — Patient Instructions (Signed)
[place] at [facility] Discharge Instructions  RECOMMENDATIONS MADE BY THE CONSULTANT AND ANY TEST RESULTS WILL BE SENT TO YOUR REFERRING PHYSICIAN.  You were given a B12 injection today. Return in a month for your next B12 injection.   Thank you for choosing [place] at [facility] to provide your oncology and hematology care.  To afford each patient quality time with our provider, please arrive at least 15 minutes before your scheduled appointment time.    If you have a lab appointment with the [place] please come in thru the  Main Entrance and check in at the main information desk  You need to re-schedule your appointment should you arrive 10 or more minutes late.  We strive to give you quality time with our providers, and arriving late affects you and other patients whose appointments are after yours.  Also, if you no show three or more times for appointments you may be dismissed from the clinic at the providers discretion.     Again, thank you for choosing [facility].  Our hope is that these requests will decrease the amount of time that you wait before being seen by our physicians.       _____________________________________________________________  Should you have questions after your visit to [facility], please contact our office at [phone] between the hours of 8:30 a.m. and 4:30 p.m.  Voicemails left after 4:30 p.m. will not be returned until the following business day.  For prescription refill requests, have your pharmacy contact our office.       Resources For Cancer Patients and their Caregivers ? American Cancer Society: Can assist with transportation, wigs, general needs, runs Look Good Feel Better.        [phone] ? Cancer Care: Provides financial assistance, online support groups, medication/co-pay assistance.  1-800-813-HOPE (732)208-0418) ? [place] Assists [place] cancer patients and their families through emotional , educational and financial support.  310-185-8201 ? [place] DSS Where to apply for food stamps, Medicaid and utility assistance. 985-544-5382 ? RCATS: Transportation to medical appointments. 234-015-9611 ? Social Security Administration: May apply for disability if have a Stage IV cancer. 346-283-1224 220-592-7183 ? [employer], Disability and Transit Services: Assists with nutrition, care and transit needs. [place] Support Programs: '@10RELATIVEDAYS'$ @ > Cancer Support Group  2nd Tuesday of the month 1pm-2pm, Journey Room  > Creative Journey  3rd Tuesday of the month 1130am-1pm, Journey Room  > Look Good Feel Better  1st Wednesday of the month 10am-12 noon, Journey Room (Call [place] to register [phone])

## 2016-04-29 ENCOUNTER — Encounter (HOSPITAL_COMMUNITY): Payer: Medicare Other

## 2016-05-02 ENCOUNTER — Encounter (HOSPITAL_COMMUNITY): Payer: Medicare Other | Attending: Hematology and Oncology

## 2016-05-02 ENCOUNTER — Encounter (HOSPITAL_COMMUNITY): Payer: Self-pay

## 2016-05-02 VITALS — BP 156/71 | HR 80 | Temp 98.1°F | Resp 18

## 2016-05-02 DIAGNOSIS — E538 Deficiency of other specified B group vitamins: Secondary | ICD-10-CM | POA: Diagnosis not present

## 2016-05-02 DIAGNOSIS — C20 Malignant neoplasm of rectum: Secondary | ICD-10-CM | POA: Insufficient documentation

## 2016-05-02 DIAGNOSIS — D649 Anemia, unspecified: Secondary | ICD-10-CM | POA: Insufficient documentation

## 2016-05-02 DIAGNOSIS — Z95828 Presence of other vascular implants and grafts: Secondary | ICD-10-CM

## 2016-05-02 MED ORDER — CYANOCOBALAMIN 1000 MCG/ML IJ SOLN
1000.0000 ug | Freq: Once | INTRAMUSCULAR | Status: AC
Start: 2016-05-02 — End: 2016-05-02
  Administered 2016-05-02: 1000 ug via INTRAMUSCULAR
  Filled 2016-05-02: qty 1

## 2016-05-02 MED ORDER — HEPARIN SOD (PORK) LOCK FLUSH 100 UNIT/ML IV SOLN
500.0000 [IU] | Freq: Once | INTRAVENOUS | Status: AC
  Administered 2016-05-02: 500 [IU] via INTRAVENOUS
  Filled 2016-05-02: qty 5

## 2016-05-02 MED ORDER — SODIUM CHLORIDE 0.9% FLUSH: 10.0000 mL | INTRAVENOUS | Status: DC | PRN

## 2016-05-02 NOTE — Progress Notes (Signed)
[name] presented for Portacath access and flush. Portacath located left chest wall accessed with  H 20 needle.  Good blood return present. Portacath flushed with 59m NS and 500U/539mHeparin and needle removed intact.  Procedure tolerated well and without incident.  Jennifer Ahlquistresents today for injection per the provider's orders.  B12 administration without incident; see MAR for injection details.  Patient tolerated procedure well and without incident.  No questions or complaints noted at this time.

## 2016-05-02 NOTE — Patient Instructions (Signed)
[place] at [facility] Discharge Instructions  RECOMMENDATIONS MADE BY THE CONSULTANT AND ANY TEST RESULTS WILL BE SENT TO YOUR REFERRING PHYSICIAN.  Port flush today. B12 injection today. Return as scheduled.   Thank you for choosing [place] at [facility] to provide your oncology and hematology care.  To afford each patient quality time with our provider, please arrive at least 15 minutes before your scheduled appointment time.    If you have a lab appointment with the [facility] please come in thru the  Main Entrance and check in at the main information desk  You need to re-schedule your appointment should you arrive 10 or more minutes late.  We strive to give you quality time with our providers, and arriving late affects you and other patients whose appointments are after yours.  Also, if you no show three or more times for appointments you may be dismissed from the clinic at the providers discretion.     Again, thank you for choosing [facility].  Our hope is that these requests will decrease the amount of time that you wait before being seen by our physicians.       _____________________________________________________________  Should you have questions after your visit to [facility], please contact our office at [phone] between the hours of 8:30 a.m. and 4:30 p.m.  Voicemails left after 4:30 p.m. will not be returned until the following business day.  For prescription refill requests, have your pharmacy contact our office.       Resources For Cancer Patients and their Caregivers ? American Cancer Society: Can assist with transportation, wigs, general needs, runs Look Good Feel Better.        [phone] ? Cancer Care: Provides financial assistance, online support groups, medication/co-pay assistance.  1-800-813-HOPE 251-195-4265) ? [place] Assists [place] cancer  patients and their families through emotional , educational and financial support.  937-211-9143 ? [place] DSS Where to apply for food stamps, Medicaid and utility assistance. [phone] ? [employer]: Transportation to medical appointments. (351)310-6649 ? Social Security Administration: May apply for disability if have a Stage IV cancer. 385-090-4046 3344366228 ? [employer], Disability and Transit Services: Assists with nutrition, care and transit needs. [place] Support Programs: '@10RELATIVEDAYS'$ @ > Cancer Support Group  2nd Tuesday of the month 1pm-2pm, Journey Room  > Creative Journey  3rd Tuesday of the month 1130am-1pm, Journey Room  > Look Good Feel Better  1st Wednesday of the month 10am-12 noon, Journey Room (Call [place] to register [phone])

## 2016-05-03 ENCOUNTER — Encounter (HOSPITAL_COMMUNITY): Payer: Medicare Other

## 2016-05-27 ENCOUNTER — Ambulatory Visit (HOSPITAL_COMMUNITY): Payer: Medicare Other | Admitting: Oncology

## 2016-05-27 ENCOUNTER — Other Ambulatory Visit (HOSPITAL_COMMUNITY): Payer: Medicare Other

## 2016-05-27 NOTE — Assessment & Plan Note (Deleted)
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 Roxan Hockey on 10/26/2014 demonstrating recurrent Stage IV disease after undergoing Xeloda+XRT finishing on 05/04/2013, followed by APR on 07/26/2013 by Dr. Arnoldo Morale with marked cytoreduction documented, followed by 6 months worth of Xeloda 1000 mg BID 2 weeks on and 1 week off x 6 months.  Surveillance per NCCN guidelines was followed with serial CT imaging of chest demonstrating an enlarging pulmonary nodule as mentioned previously.  S/P 12 cycles of FOLFOX + Avastin (01/04/2015- 06/27/2015).  Oncology history is updated.  Labs today: CBC diff, CMET, CEA.  I personally reviewed and went over laboratory results with the patient.  The results are noted within this dictation.    If CEA trend is noted to increase, I would perform restaging imaging sooner than planned.  Continue monthly B12 injections.  I personally reviewed and went over radiographic studies with the patient.  The results are noted within this dictation.  Mammogram is due in May 2018.  Order is placed.  Labs in 12 weeks: CBC diff, CMET, CEA.  CT CAP with contrast are ordered and planned to be performed in June 2018.  Return for follow-up in 3 months.

## 2016-05-27 NOTE — Progress Notes (Signed)
Patient cancelled  Review of Systems

## 2016-05-29 ENCOUNTER — Encounter (HOSPITAL_COMMUNITY): Payer: Medicare Other | Attending: Hematology and Oncology

## 2016-05-29 VITALS — BP 152/72 | HR 81 | Temp 99.0°F | Resp 20

## 2016-05-29 DIAGNOSIS — C20 Malignant neoplasm of rectum: Secondary | ICD-10-CM | POA: Diagnosis present

## 2016-05-29 DIAGNOSIS — D649 Anemia, unspecified: Secondary | ICD-10-CM | POA: Insufficient documentation

## 2016-05-29 DIAGNOSIS — E538 Deficiency of other specified B group vitamins: Secondary | ICD-10-CM

## 2016-05-29 DIAGNOSIS — C7800 Secondary malignant neoplasm of unspecified lung: Secondary | ICD-10-CM

## 2016-05-29 LAB — COMPREHENSIVE METABOLIC PANEL
ALK PHOS: 297 U/L — AB (ref 38–126)
ALT: 19 U/L (ref 14–54)
AST: 18 U/L (ref 15–41)
Albumin: 3.7 g/dL (ref 3.5–5.0)
Anion gap: 8 (ref 5–15)
BILIRUBIN TOTAL: 0.3 mg/dL (ref 0.3–1.2)
BUN: 47 mg/dL — ABNORMAL HIGH (ref 6–20)
CALCIUM: 8.8 mg/dL — AB (ref 8.9–10.3)
CO2: 29 mmol/L (ref 22–32)
CREATININE: 1.46 mg/dL — AB (ref 0.44–1.00)
Chloride: 97 mmol/L — ABNORMAL LOW (ref 101–111)
GFR, EST AFRICAN AMERICAN: 43 mL/min — AB (ref >60)
GFR, EST NON AFRICAN AMERICAN: 37 mL/min — AB (ref >60)
Glucose, Bld: 143 mg/dL — ABNORMAL HIGH (ref 65–99)
Potassium: 4.5 mmol/L (ref 3.5–5.1)
Sodium: 134 mmol/L — ABNORMAL LOW (ref 135–145)
Total Protein: 7.1 g/dL (ref 6.5–8.1)

## 2016-05-29 LAB — CBC WITH DIFFERENTIAL/PLATELET
Basophils Absolute: 0 10*3/uL (ref 0.0–0.1)
Basophils Relative: 0 %
Eosinophils Absolute: 0.3 10*3/uL (ref 0.0–0.7)
Eosinophils Relative: 5 %
HEMATOCRIT: 31.9 % — AB (ref 36.0–46.0)
HEMOGLOBIN: 10.3 g/dL — AB (ref 12.0–15.0)
LYMPHS ABS: 0.7 10*3/uL (ref 0.7–4.0)
Lymphocytes Relative: 15 %
MCH: 28.5 pg (ref 26.0–34.0)
MCHC: 32.3 g/dL (ref 30.0–36.0)
MCV: 88.1 fL (ref 78.0–100.0)
MONOS PCT: 7 %
Monocytes Absolute: 0.4 10*3/uL (ref 0.1–1.0)
NEUTROS ABS: 3.6 10*3/uL (ref 1.7–7.7)
Neutrophils Relative %: 73 %
Platelets: 192 10*3/uL (ref 150–400)
RBC: 3.62 MIL/uL — AB (ref 3.87–5.11)
RDW: 14.3 % (ref 11.5–15.5)
WBC: 4.9 10*3/uL (ref 4.0–10.5)

## 2016-05-29 MED ORDER — CYANOCOBALAMIN 1000 MCG/ML IJ SOLN
INTRAMUSCULAR | Status: AC
  Filled 2016-05-29: qty 1

## 2016-05-29 MED ORDER — SODIUM CHLORIDE 0.9% FLUSH
10.0000 mL | Freq: Once | INTRAVENOUS | Status: AC
  Administered 2016-05-29: 10 mL via INTRAVENOUS

## 2016-05-29 MED ORDER — CYANOCOBALAMIN 1000 MCG/ML IJ SOLN
1000.0000 ug | Freq: Once | INTRAMUSCULAR | Status: AC
Start: 2016-05-29 — End: 2016-05-29
  Administered 2016-05-29: 1000 ug via INTRAMUSCULAR

## 2016-05-29 MED ORDER — HEPARIN SOD (PORK) LOCK FLUSH 100 UNIT/ML IV SOLN
500.0000 [IU] | Freq: Once | INTRAVENOUS | Status: AC
  Administered 2016-05-29: 500 [IU] via INTRAVENOUS
  Filled 2016-05-29: qty 5

## 2016-05-29 NOTE — Progress Notes (Signed)
[name] presented for Portacath access and flush. Proper placement of portacath confirmed by CXR. Portacath located left chest wall accessed with  H 20 needle. Good blood return present. Portacath flushed with 41m NS and 500U/521mHeparin and needle removed intact. Procedure without incident. Patient tolerated procedure well.  Jennifer Benwayresents today for injection per MD orders. B12 1000 mcg administered IM in left Upper Arm. Administration without incident. Patient tolerated well.

## 2016-05-29 NOTE — Patient Instructions (Signed)
[place] at [facility] Discharge Instructions  RECOMMENDATIONS MADE BY THE CONSULTANT AND ANY TEST RESULTS WILL BE SENT TO YOUR REFERRING PHYSICIAN.  Port flush with lab work done today. Vitamin B12 1000 mcg injection given as ordered. Port flush due every 8 weeks. Vitamin B12 injection due every 4 weeks. Return as scheduled.  Thank you for choosing [place] at [facility] to provide your oncology and hematology care.  To afford each patient quality time with our provider, please arrive at least 15 minutes before your scheduled appointment time.    If you have a lab appointment with the [facility] please come in thru the  Main Entrance and check in at the main information desk  You need to re-schedule your appointment should you arrive 10 or more minutes late.  We strive to give you quality time with our providers, and arriving late affects you and other patients whose appointments are after yours.  Also, if you no show three or more times for appointments you may be dismissed from the clinic at the providers discretion.     Again, thank you for choosing [facility].  Our hope is that these requests will decrease the amount of time that you wait before being seen by our physicians.       _____________________________________________________________  Should you have questions after your visit to [facility], please contact our office at [phone] between the hours of 8:30 a.m. and 4:30 p.m.  Voicemails left after 4:30 p.m. will not be returned until the following business day.  For prescription refill requests, have your pharmacy contact our office.       Resources For Cancer Patients and their Caregivers ? American Cancer Society: Can assist with transportation, wigs, general needs, runs Look Good Feel Better.        506-321-3957 ? Cancer Care: Provides financial assistance, online support groups,  medication/co-pay assistance.  1-800-813-HOPE 815 110 6691) ? [facility] Assists [place] cancer patients and their families through emotional , educational and financial support.  4238084706 ? [place] DSS Where to apply for food stamps, Medicaid and utility assistance. 314-240-6502 ? [employer]: Transportation to medical appointments. (830) 549-8266 ? Social Security Administration: May apply for disability if have a Stage IV cancer. (709) 256-1037 229-015-7397 ? [employer], Disability and Transit Services: Assists with nutrition, care and transit needs. [place] Support Programs: '@10RELATIVEDAYS'$ @ > Cancer Support Group  2nd Tuesday of the month 1pm-2pm, Journey Room  > Creative Journey  3rd Tuesday of the month 1130am-1pm, Journey Room  > Look Good Feel Better  1st Wednesday of the month 10am-12 noon, Journey Room (Call  [place] to register [phone])

## 2016-05-30 LAB — CEA: CEA: 5.3 ng/mL — ABNORMAL HIGH (ref 0.0–4.7)

## 2016-06-10 ENCOUNTER — Ambulatory Visit: Payer: Medicare Other | Admitting: Podiatry

## 2016-06-25 ENCOUNTER — Other Ambulatory Visit (HOSPITAL_COMMUNITY): Payer: Self-pay | Admitting: Oncology

## 2016-06-26 ENCOUNTER — Ambulatory Visit (HOSPITAL_COMMUNITY): Payer: Medicare Other

## 2016-06-28 ENCOUNTER — Encounter (HOSPITAL_COMMUNITY): Payer: Medicare Other | Attending: Hematology and Oncology

## 2016-06-28 ENCOUNTER — Encounter (HOSPITAL_COMMUNITY): Payer: Self-pay

## 2016-06-28 VITALS — BP 127/77 | HR 74 | Temp 98.3°F | Resp 18

## 2016-06-28 DIAGNOSIS — E538 Deficiency of other specified B group vitamins: Secondary | ICD-10-CM

## 2016-06-28 DIAGNOSIS — C20 Malignant neoplasm of rectum: Secondary | ICD-10-CM | POA: Insufficient documentation

## 2016-06-28 DIAGNOSIS — D649 Anemia, unspecified: Secondary | ICD-10-CM | POA: Insufficient documentation

## 2016-06-28 MED ORDER — CYANOCOBALAMIN 1000 MCG/ML IJ SOLN
1000.0000 ug | Freq: Once | INTRAMUSCULAR | Status: AC
Start: 2016-06-28 — End: 2016-06-28
  Administered 2016-06-28: 1000 ug via INTRAMUSCULAR
  Filled 2016-06-28: qty 1

## 2016-06-28 NOTE — Patient Instructions (Signed)
[facility] at [facility] Discharge Instructions  RECOMMENDATIONS MADE BY THE CONSULTANT AND ANY TEST RESULTS WILL BE SENT TO YOUR REFERRING PHYSICIAN.  B12 injection given Follow up as scheduled.  Thank you for choosing [facility] [facility] at [facility] to provide your oncology and hematology care.  To afford each patient quality time with our provider, please arrive at least 15 minutes before your scheduled appointment time.    If you have a lab appointment with the Cancer Center please come in thru the  Main Entrance and check in at the main information desk  You need to re-schedule your appointment should you arrive 10 or more minutes late.  We strive to give you quality time with our providers, and arriving late affects you and other patients whose appointments are after yours.  Also, if you no show three or more times for appointments you may be dismissed from the clinic at the providers discretion.     Again, thank you for choosing [facility].  Our hope is that these requests will decrease the amount of time that you wait before being seen by our physicians.       _____________________________________________________________  Should you have questions after your visit to [facility], please contact our office at [phone] between the hours of 8:30 a.m. and 4:30 p.m.  Voicemails left after 4:30 p.m. will not be returned until the following business day.  For prescription refill requests, have your pharmacy contact our office.       Resources For Cancer Patients and their Caregivers ? American Cancer Society: Can assist with transportation, wigs, general needs, runs Look Good Feel Better.        1-888-227-6333 ? Cancer Care: Provides financial assistance, online support groups, medication/co-pay assistance.  1-800-813-HOPE (4673) ? [facility] Assists [place] cancer patients and  their families through emotional , educational and financial support.  336-427-4357 ? [place] DSS Where to apply for food stamps, Medicaid and utility assistance. 336-342-1394 ? [employer]: Transportation to medical appointments. 336-347-2287 ? Social Security Administration: May apply for disability if have a Stage IV cancer. 336-342-7796 1-800-772-1213 ? [place] Aging, Disability and Transit Services: Assists with nutrition, care and transit needs. 336-349-2343  Cancer Center Support Programs: @10RELATIVEDAYS@ > Cancer Support Group  2nd Tuesday of the month 1pm-2pm, Journey Room  > Creative Journey  3rd Tuesday of the month 1130am-1pm, Journey Room  > Look Good Feel Better  1st Wednesday of the month 10am-12 noon, Journey Room (Call [employer] to register [phone])   

## 2016-06-28 NOTE — Progress Notes (Signed)
[name] presents today for injection per MD orders. B12 1,023mg  administered IM  in left Upper Arm. Administration without incident. Patient tolerated well.  Vitals stable and discharged home from clinic via wheelchair with caregiver.follow up as scheduled.

## 2016-07-03 ENCOUNTER — Encounter (HOSPITAL_COMMUNITY): Payer: Medicare Other

## 2016-07-03 ENCOUNTER — Ambulatory Visit (HOSPITAL_COMMUNITY): Payer: Medicare Other

## 2016-07-04 ENCOUNTER — Ambulatory Visit: Payer: Medicare Other | Admitting: Podiatry

## 2016-07-23 ENCOUNTER — Encounter (HOSPITAL_COMMUNITY): Payer: Self-pay | Admitting: Adult Health

## 2016-07-23 ENCOUNTER — Encounter (HOSPITAL_COMMUNITY): Payer: Medicare Other | Attending: Hematology and Oncology | Admitting: Adult Health

## 2016-07-23 ENCOUNTER — Encounter (HOSPITAL_BASED_OUTPATIENT_CLINIC_OR_DEPARTMENT_OTHER): Payer: Medicare Other

## 2016-07-23 VITALS — BP 157/64 | HR 75 | Temp 98.2°F | Resp 16 | Ht 64.0 in | Wt 245.5 lb

## 2016-07-23 DIAGNOSIS — R600 Localized edema: Secondary | ICD-10-CM | POA: Diagnosis not present

## 2016-07-23 DIAGNOSIS — C20 Malignant neoplasm of rectum: Secondary | ICD-10-CM

## 2016-07-23 DIAGNOSIS — Z1231 Encounter for screening mammogram for malignant neoplasm of breast: Secondary | ICD-10-CM

## 2016-07-23 DIAGNOSIS — C7800 Secondary malignant neoplasm of unspecified lung: Secondary | ICD-10-CM

## 2016-07-23 DIAGNOSIS — E538 Deficiency of other specified B group vitamins: Secondary | ICD-10-CM | POA: Diagnosis not present

## 2016-07-23 DIAGNOSIS — D649 Anemia, unspecified: Secondary | ICD-10-CM | POA: Diagnosis present

## 2016-07-23 DIAGNOSIS — Z95828 Presence of other vascular implants and grafts: Secondary | ICD-10-CM

## 2016-07-23 LAB — CBC WITH DIFFERENTIAL/PLATELET
BASOS ABS: 0 10*3/uL (ref 0.0–0.1)
Basophils Relative: 0 %
Eosinophils Absolute: 0.2 10*3/uL (ref 0.0–0.7)
Eosinophils Relative: 5 %
HEMATOCRIT: 32 % — AB (ref 36.0–46.0)
HEMOGLOBIN: 10.1 g/dL — AB (ref 12.0–15.0)
LYMPHS PCT: 15 %
Lymphs Abs: 0.7 10*3/uL (ref 0.7–4.0)
MCH: 27.8 pg (ref 26.0–34.0)
MCHC: 31.6 g/dL (ref 30.0–36.0)
MCV: 88.2 fL (ref 78.0–100.0)
MONO ABS: 0.4 10*3/uL (ref 0.1–1.0)
MONOS PCT: 7 %
Neutro Abs: 3.5 10*3/uL (ref 1.7–7.7)
Neutrophils Relative %: 73 %
Platelets: 200 10*3/uL (ref 150–400)
RBC: 3.63 MIL/uL — ABNORMAL LOW (ref 3.87–5.11)
RDW: 14.4 % (ref 11.5–15.5)
WBC: 4.8 10*3/uL (ref 4.0–10.5)

## 2016-07-23 LAB — COMPREHENSIVE METABOLIC PANEL
ALK PHOS: 340 U/L — AB (ref 38–126)
ALT: 22 U/L (ref 14–54)
AST: 22 U/L (ref 15–41)
Albumin: 3.7 g/dL (ref 3.5–5.0)
Anion gap: 10 (ref 5–15)
BILIRUBIN TOTAL: 0.3 mg/dL (ref 0.3–1.2)
BUN: 46 mg/dL — AB (ref 6–20)
CALCIUM: 9 mg/dL (ref 8.9–10.3)
CO2: 29 mmol/L (ref 22–32)
CREATININE: 1.42 mg/dL — AB (ref 0.44–1.00)
Chloride: 100 mmol/L — ABNORMAL LOW (ref 101–111)
GFR calc Af Amer: 44 mL/min — ABNORMAL LOW (ref >60)
GFR, EST NON AFRICAN AMERICAN: 38 mL/min — AB (ref >60)
GLUCOSE: 79 mg/dL (ref 65–99)
POTASSIUM: 4.6 mmol/L (ref 3.5–5.1)
Sodium: 139 mmol/L (ref 135–145)
TOTAL PROTEIN: 7.3 g/dL (ref 6.5–8.1)

## 2016-07-23 MED ORDER — SODIUM CHLORIDE 0.9% FLUSH
10.0000 mL | Freq: Once | INTRAVENOUS | Status: AC
  Administered 2016-07-23: 10 mL via INTRAVENOUS

## 2016-07-23 MED ORDER — CYANOCOBALAMIN 1000 MCG/ML IJ SOLN
1000.0000 ug | Freq: Once | INTRAMUSCULAR | Status: AC
  Administered 2016-07-23: 1000 ug via INTRAMUSCULAR
  Filled 2016-07-23: qty 1

## 2016-07-23 MED ORDER — HEPARIN SOD (PORK) LOCK FLUSH 100 UNIT/ML IV SOLN
500.0000 [IU] | Freq: Once | INTRAVENOUS | Status: AC
  Administered 2016-07-23: 500 [IU] via INTRAVENOUS
  Filled 2016-07-23: qty 5

## 2016-07-23 NOTE — Patient Instructions (Addendum)
[facility] at [facility] Discharge Instructions  RECOMMENDATIONS MADE BY THE CONSULTANT AND ANY TEST RESULTS WILL BE SENT TO YOUR REFERRING PHYSICIAN.  You were seen today by Gretchen Dawson NP.   Thank you for choosing  [facility] at [facility] to provide your oncology and hematology care.  To afford each patient quality time with our provider, please arrive at least 15 minutes before your scheduled appointment time.    If you have a lab appointment with the Cancer Center please come in thru the  Main Entrance and check in at the main information desk  You need to re-schedule your appointment should you arrive 10 or more minutes late.  We strive to give you quality time with our providers, and arriving late affects you and other patients whose appointments are after yours.  Also, if you no show three or more times for appointments you may be dismissed from the clinic at the providers discretion.     Again, thank you for choosing [facility].  Our hope is that these requests will decrease the amount of time that you wait before being seen by our physicians.       _____________________________________________________________  Should you have questions after your visit to [facility], please contact our office at [phone] between the hours of 8:30 a.m. and 4:30 p.m.  Voicemails left after 4:30 p.m. will not be returned until the following business day.  For prescription refill requests, have your pharmacy contact our office.       Resources For Cancer Patients and their Caregivers ? American Cancer Society: Can assist with transportation, wigs, general needs, runs Look Good Feel Better.        1-888-227-6333 ? Cancer Care: Provides financial assistance, online support groups, medication/co-pay assistance.  1-800-813-HOPE (4673) ? [facility] Assists [place] cancer patients and  their families through emotional , educational and financial support.  336-427-4357 ? [place] DSS Where to apply for food stamps, Medicaid and utility assistance. 336-342-1394 ? [employer]: Transportation to medical appointments. 336-347-2287 ? Social Security Administration: May apply for disability if have a Stage IV cancer. 336-342-7796 1-800-772-1213 ? [place] Aging, Disability and Transit Services: Assists with nutrition, care and transit needs. 336-349-2343  Cancer Center Support Programs: @10RELATIVEDAYS@ > Cancer Support Group  2nd Tuesday of the month 1pm-2pm, Journey Room  > Creative Journey  3rd Tuesday of the month 1130am-1pm, Journey Room  > Look Good Feel Better  1st Wednesday of the month 10am-12 noon, Journey Room (Call [employer] to register [phone])    

## 2016-07-23 NOTE — Progress Notes (Signed)
[name] presented for Portacath access and flush.  Portacath located left chest wall accessed with  H 20 needle.  Good blood return present, labs drawn and sent to lab for resulting. Portacath flushed with 26m NS and 500U/511mHeparin and needle removed intact.  Procedure tolerated well and without incident.   Pt also given B12 injection today.B12 given in right deltoid. Pt tolerated well. Pt stable and discharged home with caregiver.

## 2016-07-23 NOTE — Progress Notes (Addendum)
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      03/25/2013 - 05/05/2013 Chemotherapy    Xeloda 1500 mg BID 5 days/week with radiation therapy.      07/26/2013 Definitive Surgery    Dr. Arnoldo Morale- Flexible sigmoidoscopy, abdominoperineal resection, total abdominal hysterectomy with bilateral salpingo-oophorectomy      08/30/2013 - 03/01/2014 Chemotherapy    Xeloda 1000 mg BID 14 days on and 7 days off x 6 months      03/08/2014 Imaging    CT CAP- Bilateral pulmonary nodules, including an 8 mm left lower lobe nodule. Metastatic disease is a concern. The left lower lobe nodule has progressed since 03/05/2013, when it measured 5 mm.      05/26/2014 Imaging    CT Chest- Continued enlargement of the superior segment left lower lobe nodule. Malignancy is likely.   In contrast, the right lower lobe nodule is probably benign.      08/17/2014 Imaging    CT- Superior segment left lower lobe pulmonary nodule measures stable since the most recent comparison study, but is again noted to have increased in size when comparing to older studies. Continued close attention will be required as neoplasm remains a con      10/26/2014 Surgery    thoracoscopic left lower lobe superior segmentectomy. Pathology with metastatic adenocarcinoma c/w colonic primary      01/04/2015 - 06/27/2015 Chemotherapy    FOLFOX+Avastin (Avastin  started on 01/18/2015).      01/09/2015 Imaging    CT CAP- Interval wedge resection of metastasis within the left lower lobe. No evidence of new metastatic disease within the chest, abdomen or pelvis.      02/15/2015 Treatment Plan Change    Treatment deferred due to renal function change (patient poor historian) with N/V.      02/22/2015 Treatment Plan Change    Added Aranesp at renal dosing to supportive therpay plan.      03/08/2015 Treatment Plan Change    Defer treatment x 1 week      04/12/2015 Adverse Reaction    Patient reported mouth sores.  None on exam.        04/12/2015 Treatment Plan Change    5FU bolus is discontinued for "mouth sores"      04/12/2015 Adverse Reaction    Increasing Creatinine Cl.  CrCl calculated to be 30.7.        04/12/2015 Treatment Plan Change    Oxaliplatin dose reduced by 20% to 65 mg/m2 based upon creatinine clearance.      05/24/2015 Treatment Plan Change    5FU CI decreased by 15%.      08/29/2015 Imaging    CT CAP- Status post APR with left lower quadrant colostomy, and some stable presacral scarring. No definite evidence of metastatic disease in the CAP.  There is a new 3 mm nodule in the right lower  lobe which is highly nonspecific. Attention on follow-up.      01/03/2016 Imaging    CT chest- No definite findings to suggest metastatic disease to the lungs. Previously noted 3 mm right lower lobe pulmonary nodule stable, favored to be benign.        HISTORY OF PRESENT ILLNESS:  (From Jennifer Crigler, PA-C's last note on 02/26/16)     INTERVAL HISTORY:  [name] 66 y.o. female returns for routine follow-up for metastatic rectal cancer.   She is here today with one of her caregivers from [facility] group home. Overall, she tells me that she feels really well. Denies any changes in bowel or bladder; no blood in her stool or concerns with her ostomy.  Denies abdominal pain, nausea, vomiting, headaches, dizziness, or falls.   Her only complaint is chronic lower extremity edema; this is largely unchanged.    Otherwise, she is largely without complaints today.  Appetite and energy levels are both 100%. She has gained about 8 lbs in past  7 months.     REVIEW OF SYSTEMS:  Review of Systems  Constitutional: Negative.  Negative for chills, fatigue and fever.  HENT:  Negative.  Negative for lump/mass and nosebleeds.   Eyes: Negative.   Respiratory: Negative.  Negative for cough and shortness of breath.   Cardiovascular: Positive for leg swelling. Negative for chest pain.  Gastrointestinal: Negative.  Negative for abdominal pain, blood in stool, constipation, diarrhea, nausea and vomiting.  Endocrine: Negative.   Genitourinary: Negative.  Negative for dysuria and hematuria.   Musculoskeletal: Negative.  Negative for arthralgias.  Skin: Negative.  Negative for rash.  Neurological: Negative.  Negative for dizziness and headaches.  Hematological: Negative.  Negative for adenopathy. Does not bruise/bleed easily.  Psychiatric/Behavioral: Negative.  Negative for depression and sleep disturbance. The patient is not nervous/anxious.      PAST MEDICAL/SURGICAL HISTORY:  Past Medical History:  Diagnosis Date  . Arthritis   . Asthma   . Cancer (HCC)    Rectal   . Chronic renal disease, stage 3, moderately decreased glomerular filtration rate between 30-59 mL/min/1.73 square meter 04/27/2014  . Depression   . Diabetes mellitus    years  . GERD (gastroesophageal reflux disease)   . Gout   . History of recurrent UTIs   . Hypertension   . Iron deficiency 04/27/2014  . Mental retardation    stopped school at 9th grade   . Numbness and tingling in hands    x several months   . Rectal cancer ([place])   . Seizures ([place])    more than 4 yrs since last seizure. UNknown etiology  . Shortness of breath    with exertion  . Vitamin B 12 deficiency 02/01/2015   Incidentally found without antibody testing.     Past Surgical  History:  Procedure Laterality Date  . ABDOMINAL HYSTERECTOMY    . ABDOMINAL PERINEAL BOWEL RESECTION N/A 07/26/2013   Procedure:  ABDOMINAL PERINEAL RESECTION;  Surgeon: Jamesetta So, MD;  Location: [facility];  Service: General;  Laterality: N/A;  . COLON SURGERY  2015   bowel resection/w colostomy  . COLONOSCOPY N/A 02/04/2013   Procedure: COLONOSCOPY;  Surgeon: Rogene Houston, MD;  [facility];  Service: Endoscopy;  Laterality: N/A;  225  . COLONOSCOPY N/A 05/12/2014   Procedure: COLONOSCOPY;  Surgeon: Rogene Houston, MD;  [facility];  Service: Endoscopy;  Laterality: N/A;  1030  . COLONOSCOPY WITH ESOPHAGOGASTRODUODENOSCOPY (EGD)  N/A 01/14/2013   Procedure: COLONOSCOPY WITH ESOPHAGOGASTRODUODENOSCOPY (EGD);  Surgeon: Rogene Houston, MD;  [facility];  Service: Endoscopy;  Laterality: N/A;  250-moved to 315 Ann to notify pt  . COLOSTOMY Left 07/26/2013   Procedure: COLOSTOMY;  Surgeon: Jamesetta So, MD;  Location: [facility];  Service: General;  Laterality: Left;  . EUS N/A 02/18/2013   Procedure: LOWER ENDOSCOPIC ULTRASOUND (EUS);  Surgeon: Milus Banister, MD;  Location: [facility];  Service: Endoscopy;  Laterality: N/A;  . FLEXIBLE SIGMOIDOSCOPY N/A 07/26/2013   Procedure: FLEXIBLE SIGMOIDOSCOPY;  Surgeon: Jamesetta So, MD;  Location: [facility];  Service: General;  Laterality: N/A;  . LYMPH NODE DISSECTION Left 10/26/2014   Procedure: LYMPH NODE DISSECTION;  Surgeon: [name], MD;  Location: [facility];  Service: Thoracic;  Laterality: Left;  [name] MULTIPLE EXTRACTIONS WITH ALVEOLOPLASTY N/A 11/23/2012   Procedure: MULTIPLE EXTRACION 1, 2, 4, 5, 6, 7, 8, 9, 10, 11, 12, 13, 14, 17, 18, 20, 23, 24, 25, 26, 28, 29, 32 WITH ALVEOLOPLASTY, REMOVE BILATERAL TORI;  Surgeon: Gae Bon, DDS;  Location: [facility];  Service: Oral Surgery;  Laterality: N/A;  . PORTACATH PLACEMENT Left 01/02/2015   Procedure: INSERTION PORT-A-CATH;  Surgeon: Aviva Signs, MD;   [facility];  Service: General;  Laterality: Left;  . SALPINGOOPHORECTOMY Bilateral 07/26/2013   Procedure: SALPINGO OOPHORECTOMY;  Surgeon: Jamesetta So, MD;  [facility];  Service: General;  Laterality: Bilateral;  . SEGMENTECOMY Left 10/26/2014   Procedure: LEFT LOWER LOBE SUPERIOR SEGMENTECTOMY;  Surgeon: [name], MD;  Location: [place];  Service: Thoracic;  Laterality: Left;  . SUPRACERVICAL ABDOMINAL HYSTERECTOMY N/A 07/26/2013   Procedure: HYSTERECTOMY SUPRACERVICAL ABDOMINAL ;  Surgeon: Jamesetta So, MD;  [facility];  Service: General;  Laterality: N/A;  . VIDEO ASSISTED THORACOSCOPY Left 10/26/2014   Procedure: LEFT VIDEO ASSISTED THORACOSCOPY;  Surgeon: [name], MD;  Location: [facility];  Service: Thoracic;  Laterality: Left;     SOCIAL HISTORY:  Social History   Social History  . Marital status: Single    Spouse name: N/A  . Number of children: N/A  . Years of education: N/A   Occupational History  . Not on file.   Social History Main Topics  . Smoking status: Former Smoker    Types: Cigarettes    Quit date: 07/16/2008  . Smokeless tobacco: Never Used     Comment: unknown when stopped smoking      long time  . Alcohol use No  . Drug use: No  . Sexual activity: Not on file   Other Topics Concern  . Not on file   Social History Narrative  . No narrative on file    FAMILY HISTORY:  History reviewed. No pertinent family history.  CURRENT MEDICATIONS:  Outpatient Encounter Prescriptions as of 07/23/2016  Medication Sig Note  . amLODipine (NORVASC) 10 MG tablet Take 1 tablet (10 mg total) by mouth every morning.   [name] aspirin EC 81 MG tablet Take 81 mg by mouth daily.   . calcium carbonate (TUMS) 500 MG chewable tablet Chew 2 tablets (400 mg of elemental calcium total) by mouth 3 (three) times daily.   [name] docusate sodium (COLACE) 100 MG capsule Take 100 mg by mouth 2 (two) times daily.   . ferrous sulfate 325 (65 FE) MG tablet TAKE  (1) TABLET BY MOUTH TWICE DAILY.   . fish oil-omega-3 fatty acids 1000 MG capsule Take  1 g by mouth 2 (two) times daily.   . Fluticasone-Salmeterol (ADVAIR) 250-50 MCG/DOSE AEPB Inhale 1 puff into the lungs every 12 (twelve) hours.   . furosemide (LASIX) 40 MG tablet Take 40 mg by mouth daily.   [name] glucose blood (ACCU-CHEK AVIVA PLUS) test strip 1 each by Other route daily. Use as instructed   . Insulin Glargine (LANTUS SOLOSTAR) 100 UNIT/ML SOPN Inject 12 Units into the skin at bedtime.  10/24/2014: .  . ipratropium-albuterol (DUONEB) 0.5-2.5 (3) MG/3ML SOLN  03/08/2015: Received from: External Pharmacy  . loratadine-pseudoephedrine (LORATADINE-D 24HR) 10-240 MG per 24 hr tablet Take 1 tablet by mouth daily.   . magnesium oxide (MAG-OX) 400 (241.3 Mg) MG tablet Take 1 tablet (400 mg total) by mouth daily.   . metFORMIN (GLUCOPHAGE) 1000 MG tablet Take 1,000 mg by mouth 2 (two) times daily with a meal.   . metoprolol succinate (TOPROL-XL) 50 MG 24 hr tablet Take 100 mg by mouth at bedtime.  10/24/2014: .  . montelukast (SINGULAIR) 10 MG tablet Take 10 mg by mouth at bedtime.   [name] NOVOFINE AUTOCOVER 30G X 8 MM MISC  05/24/2015: Received from: External Pharmacy  . OS-CAL CALCIUM + D3 500-200 MG-UNIT TABS TAKE 2 TABLETS BY MOUTH DAILY WITH BREAKFAST.   . pantoprazole (PROTONIX) 40 MG tablet Take 40 mg by mouth every morning.    . phenytoin (DILANTIN) 100 MG ER capsule Take 100 mg by mouth 2 (two) times daily. 02/03/2013: .  . potassium chloride SA (K-DUR,KLOR-CON) 20 MEQ tablet Take 20 mEq by mouth daily. Reported on 07/07/2015   . pravastatin (PRAVACHOL) 40 MG tablet Take 40 mg by mouth every evening.    [name] PROAIR HFA 108 (90 BASE) MCG/ACT inhaler  12/27/2014: Received from: External Pharmacy  . senna-docusate (SENOKOT-S) 8.6-50 MG tablet Take 1 tablet by mouth 2 (two) times daily as needed for mild constipation.   . sodium polystyrene (KAYEXALATE) powder Take by mouth once. (Patient not taking: Reported on  06/28/2016)   . traMADol (ULTRAM) 50 MG tablet Take 1 tablet (50 mg total) by mouth every 4 (four) hours as needed for moderate pain.   . traZODone (DESYREL) 50 MG tablet  02/15/2015: Received from: External Pharmacy  . triamcinolone cream (KENALOG) 0.1 % Apply 1 application topically 2 (two) times daily. To stoma   . [EXPIRED] cyanocobalamin ((VITAMIN B-12)) injection 1,000 mcg    . [EXPIRED] heparin lock flush 100 unit/mL    . [EXPIRED] sodium chloride flush (NS) 0.9 % injection 10 mL     No facility-administered encounter medications on file as of 07/23/2016.     ALLERGIES:  No Known Allergies   PHYSICAL EXAM:  ECOG Performance status: 1-2 - Requires assistance given cognitive delay; lives at group home.   Vitals:   07/23/16 1055  BP: (!) 157/64  Pulse: 75  Resp: 16  Temp: 98.2 F (36.8 C)   Filed Weights   07/23/16 1055  Weight: 245 lb 8 oz (111.4 kg)    Physical Exam  Constitutional: She is oriented to person, place, and time and well-developed, well-nourished, and in no distress.  Seen seated in wheelchair.   HENT:  Head: Normocephalic.  Mouth/Throat: Oropharynx is clear and moist. No oropharyngeal exudate.  Eyes: Conjunctivae are normal. Pupils are equal, round, and reactive to light. No scleral icterus.  Neck: Normal range of motion.  Cardiovascular: Normal rate, regular rhythm and normal heart sounds.   Pulmonary/Chest: Effort normal and breath sounds normal. No respiratory  distress. She has no wheezes. She has no rales.  Abdominal: Soft. She exhibits no distension. There is no tenderness. There is no rebound and no guarding.  -LLQ abd ostomy in place  -Mildly hypoactive bowel sounds   Musculoskeletal: Normal range of motion. She exhibits edema (1+ BLE/ankle edema ).  Ambulates with walker at group home.   Lymphadenopathy:    She has no cervical adenopathy.  Neurological: She is alert and oriented to person, place, and time.  Skin: Skin is warm and dry. No rash  noted.  Psychiatric: Mood and affect normal.  Apparent cognitive delay      LABORATORY DATA:  I have reviewed the labs as listed.  CBC    Component Value Date/Time   WBC 4.9 05/29/2016 1115   RBC 3.62 (L) 05/29/2016 1115   HGB 10.3 (L) 05/29/2016 1115   HCT 31.9 (L) 05/29/2016 1115   PLT 192 05/29/2016 1115   MCV 88.1 05/29/2016 1115   MCH 28.5 05/29/2016 1115   MCHC 32.3 05/29/2016 1115   RDW 14.3 05/29/2016 1115   LYMPHSABS 0.7 05/29/2016 1115   MONOABS 0.4 05/29/2016 1115   EOSABS 0.3 05/29/2016 1115   BASOSABS 0.0 05/29/2016 1115   CMP Latest Ref Rng & Units 05/29/2016 02/26/2016 01/01/2016  Glucose 65 - 99 mg/dL 143(H) 74 114(H)  BUN 6 - 20 mg/dL 47(H) 44(H) 46(H)  Creatinine 0.44 - 1.00 mg/dL 1.46(H) 1.42(H) 1.49(H)  Sodium 135 - 145 mmol/L 134(L) 135 136  Potassium 3.5 - 5.1 mmol/L 4.5 4.9 4.7  Chloride 101 - 111 mmol/L 97(L) 98(L) 101  CO2 22 - 32 mmol/L '29 29 27  '$ Calcium 8.9 - 10.3 mg/dL 8.8(L) 9.4 8.5(L)  Total Protein 6.5 - 8.1 g/dL 7.1 7.2 6.9  Total Bilirubin 0.3 - 1.2 mg/dL 0.3 0.4 0.4  Alkaline Phos 38 - 126 U/L 297(H) 300(H) 260(H)  AST 15 - 41 U/L '18 19 24  '$ ALT 14 - 54 U/L '19 15 27    '$ PENDING LABS:    DIAGNOSTIC IMAGING:  *The following radiologic images and reports have been reviewed independently and agree with below findings.  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 Subsequent CT imaging revealed enlarging LLL pulmonary nodule. She underwent LLL resection with path confirming metastatic adenocarcinoma consistent with colon primary. Started chemotherapy with FOLFOX/Avastin in 12/2014. Chemotherapy course  complicated by several treatment deferments for adverse side effects requiring dose reductions/discontinuations.   -CEA levels have mildly elevated; CEA pending today.  -Due for surveillance CT chest/abd/pelvis in 08/2016 (annual imaging). Orders placed by Jennifer Crigler, PA-C at previous visit.  -Will refer to Dr. Laural Golden with GI for colonoscopy consideration based on national guidelines for rectal cancer surveillance. Explained the purpose of colonoscopy in patients with history of rectal cancer. She does have colostomy; no GI complaints. Given the patient's cognitive delay, I will defer to Dr. Olevia Perches medical opinion if he thinks colonoscopy is appropriate at this time.  -Return to cancer center in 4 months for continued surveillance.    NCCN Guidelines reviewed:    Port-a-cath removal discussion:  -Discussed option of having port-a-cath removed since she is now nearly 3.5 years out from  her cancer diagnosis without signs of recurrence.  This would simplify part of her care with discontinuation of every 2 month port flush appointments.   -Caregiver here today with patient will speak with patient's guardian and the facility director. I shared with the patient and caregiver that it certainly was not required to have the port-a-cath removed, but wanted to offer it as an option to help simplify some of her appointments, if that was desired. Otherwise, we are happy to continue port flushes here at the cancer center going forward.   Health maintenance:  -Discussed importance of annual breast imaging.  -Mammogram is due 07/2016; orders placed today.   Low vitamin B12:  -Continue monthly B12 injections at cancer center.     Dispo:  -CT chest/abd/pelvis due 08/2016; orders previously placed by Jennifer Crigler, PA-C at previous appt.  -Refer to Dr. Laural Golden with GI for colonoscopy consideration.  -Continue monthly B12 injections -Continue every 2 month port flushes.  -Return to cancer center for follow-up  visit in 4 months.    All questions were answered to patient's stated satisfaction. Encouraged patient to call with any new concerns or questions before her next visit to the cancer center and we can certain see her sooner, if needed.    Plan of care discussed with Dr. Talbert Cage, who agrees with the above aforementioned.    Orders placed this encounter:  Orders Placed This Encounter  Procedures  . MM SCREENING BREAST TOMO BILATERAL  . CBC with Differential/Platelet  . CEA  . Comprehensive metabolic panel      Mike Craze, NP [place] [phone]

## 2016-07-24 ENCOUNTER — Telehealth (INDEPENDENT_AMBULATORY_CARE_PROVIDER_SITE_OTHER): Payer: Self-pay | Admitting: *Deleted

## 2016-07-24 ENCOUNTER — Encounter (HOSPITAL_COMMUNITY): Payer: Medicare Other

## 2016-07-24 LAB — CEA: CEA: 4.6 ng/mL (ref 0.0–4.7)

## 2016-07-24 NOTE — Telephone Encounter (Signed)
rec'd referral from Dr Renato Battles for TCS -- last TCS was 04/2014 -- please advise if time -- thanks

## 2016-07-26 ENCOUNTER — Encounter (INDEPENDENT_AMBULATORY_CARE_PROVIDER_SITE_OTHER): Payer: Self-pay | Admitting: *Deleted

## 2016-07-26 NOTE — Telephone Encounter (Signed)
Will schedule colonoscopy now per oncology recommendations

## 2016-07-26 NOTE — Telephone Encounter (Signed)
Letter mailed to patient.

## 2016-08-20 ENCOUNTER — Encounter (HOSPITAL_COMMUNITY): Payer: Self-pay

## 2016-08-20 ENCOUNTER — Encounter (HOSPITAL_COMMUNITY): Payer: Medicare Other | Attending: Hematology and Oncology

## 2016-08-20 VITALS — BP 142/59 | HR 79 | Temp 98.6°F | Resp 18

## 2016-08-20 DIAGNOSIS — E538 Deficiency of other specified B group vitamins: Secondary | ICD-10-CM | POA: Diagnosis not present

## 2016-08-20 DIAGNOSIS — C20 Malignant neoplasm of rectum: Secondary | ICD-10-CM | POA: Insufficient documentation

## 2016-08-20 DIAGNOSIS — D649 Anemia, unspecified: Secondary | ICD-10-CM | POA: Insufficient documentation

## 2016-08-20 MED ORDER — CYANOCOBALAMIN 1000 MCG/ML IJ SOLN
1000.0000 ug | Freq: Once | INTRAMUSCULAR | Status: AC
  Administered 2016-08-20: 1000 ug via INTRAMUSCULAR
  Filled 2016-08-20: qty 1

## 2016-08-20 NOTE — Progress Notes (Signed)
[name] tolerated Vit B12 injection well without complaints or incident. VSS Pt and caregiver reminded to pick-up contrast for CT scan this month and both verbalized understanding.Pt discharged via wheelchair in satisfactory condition accompanied by her caregiver

## 2016-08-20 NOTE — Patient Instructions (Signed)
[facility] at [facility] Discharge Instructions  RECOMMENDATIONS MADE BY THE CONSULTANT AND ANY TEST RESULTS WILL BE SENT TO YOUR REFERRING PHYSICIAN.  Received Vit B12 injection today. Follow-up as scheduled. Call clinic for any questions or concerns  Thank you for choosing [facility] at [facility] to provide your oncology and hematology care.  To afford each patient quality time with our provider, please arrive at least 15 minutes before your scheduled appointment time.    If you have a lab appointment with the Cancer Center please come in thru the  Main Entrance and check in at the main information desk  You need to re-schedule your appointment should you arrive 10 or more minutes late.  We strive to give you quality time with our providers, and arriving late affects you and other patients whose appointments are after yours.  Also, if you no show three or more times for appointments you may be dismissed from the clinic at the providers discretion.     Again, thank you for choosing [facility].  Our hope is that these requests will decrease the amount of time that you wait before being seen by our physicians.       _____________________________________________________________  Should you have questions after your visit to [facility], please contact our office at [phone] between the hours of 8:30 a.m. and 4:30 p.m.  Voicemails left after 4:30 p.m. will not be returned until the following business day.  For prescription refill requests, have your pharmacy contact our office.       Resources For Cancer Patients and their Caregivers ? American Cancer Society: Can assist with transportation, wigs, general needs, runs Look Good Feel Better.        1-888-227-6333 ? Cancer Care: Provides financial assistance, online support groups, medication/co-pay assistance.  1-800-813-HOPE (4673) ? [facility] Assists [place] cancer patients and their families through emotional , educational and financial support.  336-427-4357 ? [place] DSS Where to apply for food stamps, Medicaid and utility assistance. 336-342-1394 ? [facility]: Transportation to medical appointments. 336-347-2287 ? Social Security Administration: May apply for disability if have a Stage IV cancer. 336-342-7796 1-800-772-1213 ? [place] Aging, Disability and Transit Services: Assists with nutrition, care and transit needs. 336-349-2343  Cancer Center Support Programs: @[email or web address]@ > Cancer Support Group  2nd Tuesday of the month 1pm-2pm, Journey Room  > Creative Journey  3rd Tuesday of the month 1130am-1pm, Journey Room  > Look Good Feel Better  1st Wednesday of the month 10am-12 noon, Journey Room (Call American Cancer Society to register 1-800-395-5775)   

## 2016-08-22 ENCOUNTER — Ambulatory Visit (HOSPITAL_COMMUNITY)
Admission: RE | Admit: 2016-08-22 | Discharge: 2016-08-22 | Disposition: A | Discharge status: Home / Self Care | Payer: Medicare Other | Source: Ambulatory Visit | Attending: Oncology | Admitting: Oncology

## 2016-08-22 ENCOUNTER — Ambulatory Visit (HOSPITAL_COMMUNITY)
Admission: RE | Admit: 2016-08-22 | Discharge: 2016-08-22 | Disposition: A | Discharge status: Home / Self Care | Payer: Medicare Other | Source: Ambulatory Visit | Attending: Adult Health | Admitting: Adult Health

## 2016-08-22 DIAGNOSIS — R911 Solitary pulmonary nodule: Secondary | ICD-10-CM | POA: Diagnosis present

## 2016-08-22 DIAGNOSIS — C20 Malignant neoplasm of rectum: Secondary | ICD-10-CM | POA: Insufficient documentation

## 2016-08-22 DIAGNOSIS — Z1231 Encounter for screening mammogram for malignant neoplasm of breast: Secondary | ICD-10-CM | POA: Insufficient documentation

## 2016-08-22 DIAGNOSIS — Z933 Colostomy status: Secondary | ICD-10-CM | POA: Insufficient documentation

## 2016-08-22 DIAGNOSIS — C7800 Secondary malignant neoplasm of unspecified lung: Secondary | ICD-10-CM

## 2016-08-22 MED ORDER — IOPAMIDOL (ISOVUE-300) INJECTION 61%
100.0000 mL | Freq: Once | INTRAVENOUS | Status: AC | PRN
  Administered 2016-08-22: 80 mL via INTRAVENOUS

## 2016-08-23 ENCOUNTER — Other Ambulatory Visit (HOSPITAL_COMMUNITY): Payer: Self-pay

## 2016-08-23 MED ORDER — CALCIUM CARBONATE ANTACID 500 MG PO CHEW: 2.0000 | CHEWABLE_TABLET | Freq: Three times a day (TID) | ORAL | 2 refills | Status: DC

## 2016-08-23 NOTE — Telephone Encounter (Signed)
Received refill request from patients pharmacy for tums. Reviewed with provider, chart checked and refilled.-

## 2016-09-17 ENCOUNTER — Encounter (HOSPITAL_COMMUNITY): Payer: Medicare Other | Attending: Hematology and Oncology

## 2016-09-17 ENCOUNTER — Encounter (HOSPITAL_COMMUNITY): Payer: Self-pay

## 2016-09-17 VITALS — BP 105/73 | HR 74 | Temp 97.8°F | Resp 18

## 2016-09-17 DIAGNOSIS — E538 Deficiency of other specified B group vitamins: Secondary | ICD-10-CM | POA: Diagnosis not present

## 2016-09-17 DIAGNOSIS — C20 Malignant neoplasm of rectum: Secondary | ICD-10-CM | POA: Diagnosis not present

## 2016-09-17 DIAGNOSIS — D649 Anemia, unspecified: Secondary | ICD-10-CM | POA: Insufficient documentation

## 2016-09-17 MED ORDER — CYANOCOBALAMIN 1000 MCG/ML IJ SOLN
1000.0000 ug | Freq: Once | INTRAMUSCULAR | Status: AC
  Administered 2016-09-17: 1000 ug via INTRAMUSCULAR

## 2016-09-17 MED ORDER — SODIUM CHLORIDE 0.9% FLUSH
10.0000 mL | INTRAVENOUS | Status: DC | PRN
  Administered 2016-09-17: 10 mL via INTRAVENOUS
  Filled 2016-09-17: qty 10

## 2016-09-17 MED ORDER — CYANOCOBALAMIN 1000 MCG/ML IJ SOLN
INTRAMUSCULAR | Status: AC
  Filled 2016-09-17: qty 1

## 2016-09-17 MED ORDER — HEPARIN SOD (PORK) LOCK FLUSH 100 UNIT/ML IV SOLN
500.0000 [IU] | Freq: Once | INTRAVENOUS | Status: AC
  Administered 2016-09-17: 500 [IU] via INTRAVENOUS
  Filled 2016-09-17: qty 5

## 2016-09-17 NOTE — Progress Notes (Signed)
[name] presents today for injection per the provider's orders.  B12 administration without incident; see MAR for injection details.  Patient tolerated procedure well and without incident.  No questions or complaints noted at this time.  [name] presented for Portacath access and flush. Portacath located left chest wall accessed with  H 20 needle.  Good blood return present. Portacath flushed with 49ml NS and 500U/78ml Heparin and needle removed intact.  Procedure tolerated well and without incident.  Discharged via wheelchair in c/o caretaker.

## 2016-10-03 ENCOUNTER — Other Ambulatory Visit (HOSPITAL_COMMUNITY): Payer: Self-pay | Admitting: Oncology

## 2016-10-15 ENCOUNTER — Ambulatory Visit (HOSPITAL_COMMUNITY): Payer: Medicare Other

## 2016-10-16 ENCOUNTER — Encounter (HOSPITAL_COMMUNITY): Payer: Self-pay

## 2016-10-16 ENCOUNTER — Encounter (HOSPITAL_COMMUNITY): Payer: Medicare Other | Attending: Hematology and Oncology

## 2016-10-16 VITALS — BP 169/67 | HR 76 | Temp 98.0°F | Resp 18

## 2016-10-16 DIAGNOSIS — D649 Anemia, unspecified: Secondary | ICD-10-CM | POA: Insufficient documentation

## 2016-10-16 DIAGNOSIS — E538 Deficiency of other specified B group vitamins: Secondary | ICD-10-CM

## 2016-10-16 DIAGNOSIS — C20 Malignant neoplasm of rectum: Secondary | ICD-10-CM | POA: Insufficient documentation

## 2016-10-16 MED ORDER — CYANOCOBALAMIN 1000 MCG/ML IJ SOLN
INTRAMUSCULAR | Status: AC
  Filled 2016-10-16: qty 1

## 2016-10-16 MED ORDER — CYANOCOBALAMIN 1000 MCG/ML IJ SOLN
1000.0000 ug | Freq: Once | INTRAMUSCULAR | Status: AC
  Administered 2016-10-16: 1000 ug via INTRAMUSCULAR

## 2016-10-16 NOTE — Patient Instructions (Signed)
[place] at [facility] Discharge Instructions  RECOMMENDATIONS MADE BY THE CONSULTANT AND ANY TEST RESULTS WILL BE SENT TO YOUR REFERRING PHYSICIAN.  You received your B-12 injection today, continue to get it monthly Follow up as scheduled.  Thank you for choosing [place] at [facility] to provide your oncology and hematology care.  To afford each patient quality time with our provider, please arrive at least 15 minutes before your scheduled appointment time.    If you have a lab appointment with the [facility] please come in thru the  Main Entrance and check in at the main information desk  You need to re-schedule your appointment should you arrive 10 or more minutes late.  We strive to give you quality time with our providers, and arriving late affects you and other patients whose appointments are after yours.  Also, if you no show three or more times for appointments you may be dismissed from the clinic at the providers discretion.     Again, thank you for choosing [facility].  Our hope is that these requests will decrease the amount of time that you wait before being seen by our physicians.       _____________________________________________________________  Should you have questions after your visit to [facility], please contact our office at [phone] between the hours of 8:30 a.m. and 4:30 p.m.  Voicemails left after 4:30 p.m. will not be returned until the following business day.  For prescription refill requests, have your pharmacy contact our office.       Resources For Cancer Patients and their Caregivers ? American Cancer Society: Can assist with transportation, wigs, general needs, runs Look Good Feel Better.        515-301-3874 ? Cancer Care: Provides financial assistance, online support groups, medication/co-pay assistance.  1-800-813-HOPE 7070625503) ? [facility] Assists [place] cancer patients and their families through emotional , educational and financial support.  507-118-0801 ? [place] DSS Where to apply for food stamps, Medicaid and utility assistance. 228-361-4574 ? [employer]: Transportation to medical appointments. 725-142-8431 ? Social Security Administration: May apply for disability if have a Stage IV cancer. 651-191-5678 684-683-7568 ? [employer], Disability and Transit Services: Assists with nutrition, care and transit needs. [place] Support Programs: @10RELATIVEDAYS @ > Cancer Support Group  2nd Tuesday of the month 1pm-2pm, Journey Room  > Creative Journey  3rd Tuesday of the month 1130am-1pm, Journey Room  > Look Good Feel Better  1st Wednesday of the month 10am-12 noon, Journey Room (Call [place] to register [phone])

## 2016-10-16 NOTE — Progress Notes (Signed)
[name] presents today for injection per MD orders. B12 1000 mcg administered IM in left deltoid. Administration without incident. Patient tolerated well. Patient discharged in stable condition via wheelchair with caregiver. Patient provided with a copy of appointments and is aware of when to follow up.

## 2016-11-12 ENCOUNTER — Encounter (HOSPITAL_BASED_OUTPATIENT_CLINIC_OR_DEPARTMENT_OTHER): Payer: Medicare Other

## 2016-11-12 ENCOUNTER — Ambulatory Visit (HOSPITAL_COMMUNITY): Payer: Medicare Other | Admitting: Adult Health

## 2016-11-12 ENCOUNTER — Encounter (HOSPITAL_COMMUNITY): Payer: Self-pay

## 2016-11-12 VITALS — BP 153/60 | HR 76 | Temp 98.6°F | Resp 18

## 2016-11-12 DIAGNOSIS — Z95828 Presence of other vascular implants and grafts: Secondary | ICD-10-CM

## 2016-11-12 DIAGNOSIS — E538 Deficiency of other specified B group vitamins: Secondary | ICD-10-CM

## 2016-11-12 DIAGNOSIS — C20 Malignant neoplasm of rectum: Secondary | ICD-10-CM | POA: Diagnosis not present

## 2016-11-12 MED ORDER — HEPARIN SOD (PORK) LOCK FLUSH 100 UNIT/ML IV SOLN
500.0000 [IU] | Freq: Once | INTRAVENOUS | Status: AC
  Administered 2016-11-12: 500 [IU] via INTRAVENOUS
  Filled 2016-11-12: qty 5

## 2016-11-12 MED ORDER — SODIUM CHLORIDE 0.9% FLUSH
10.0000 mL | Freq: Once | INTRAVENOUS | Status: AC
  Administered 2016-11-12: 10 mL via INTRAVENOUS

## 2016-11-12 MED ORDER — CYANOCOBALAMIN 1000 MCG/ML IJ SOLN
1000.0000 ug | Freq: Once | INTRAMUSCULAR | Status: AC
  Administered 2016-11-12: 1000 ug via INTRAMUSCULAR

## 2016-11-12 MED ORDER — CYANOCOBALAMIN 1000 MCG/ML IJ SOLN
INTRAMUSCULAR | Status: AC
  Filled 2016-11-12: qty 1

## 2016-11-12 NOTE — Progress Notes (Signed)
Port flushed per protocol.  Site clean and dry with no complaints of pain with flush.  Band aid applied.  No bruising or swelling noted.    Vitamin b12 given with no complaints voiced. Band aid applied after administration of shot.  VSS with discharge and left in wheelchair with family.    Patient tolerated port flush and b12 shot with no complaints voiced.

## 2016-11-12 NOTE — Patient Instructions (Signed)
[place] at [facility]  Discharge Instructions:  Port flush today and vitamin b12 shot.  Keep scheduled appointments and call for any problems.   _______________________________________________________________  Thank you for choosing [place] at [facility] to provide your oncology and hematology care.  To afford each patient quality time with our providers, please arrive at least 15 minutes before your scheduled appointment.  You need to re-schedule your appointment if you arrive 10 or more minutes late.  We strive to give you quality time with our providers, and arriving late affects you and other patients whose appointments are after yours.  Also, if you no show three or more times for appointments you may be dismissed from the clinic.  Again, thank you for choosing [place] at [place] hope is that these requests will allow you access to exceptional care and in a timely manner. _______________________________________________________________  If you have questions after your visit, please contact our office at [phone] between the hours of 8:30 a.m. and 5:00 p.m. Voicemails left after 4:30 p.m. will not be returned until the following business day. _______________________________________________________________  For prescription refill requests, have your pharmacy contact our office. _______________________________________________________________  Recommendations made by the consultant and any test results will be sent to your referring physician. _______________________________________________________________

## 2016-12-02 ENCOUNTER — Other Ambulatory Visit (HOSPITAL_COMMUNITY): Payer: Self-pay | Admitting: Emergency Medicine

## 2016-12-02 MED ORDER — CALCIUM CARBONATE ANTACID 500 MG PO CHEW: 2.0000 | CHEWABLE_TABLET | Freq: Three times a day (TID) | ORAL | 2 refills | Status: DC

## 2016-12-13 ENCOUNTER — Ambulatory Visit (HOSPITAL_COMMUNITY): Payer: Medicare Other

## 2016-12-13 ENCOUNTER — Ambulatory Visit (HOSPITAL_COMMUNITY): Payer: Medicare Other | Admitting: Adult Health

## 2016-12-18 ENCOUNTER — Encounter (HOSPITAL_COMMUNITY): Payer: Medicare Other | Attending: Hematology and Oncology | Admitting: Oncology

## 2016-12-18 ENCOUNTER — Encounter (HOSPITAL_BASED_OUTPATIENT_CLINIC_OR_DEPARTMENT_OTHER): Payer: Medicare Other

## 2016-12-18 ENCOUNTER — Encounter (HOSPITAL_COMMUNITY): Payer: Self-pay | Admitting: Oncology

## 2016-12-18 VITALS — BP 131/56 | HR 86 | Resp 16

## 2016-12-18 DIAGNOSIS — E538 Deficiency of other specified B group vitamins: Secondary | ICD-10-CM

## 2016-12-18 DIAGNOSIS — M7989 Other specified soft tissue disorders: Secondary | ICD-10-CM

## 2016-12-18 DIAGNOSIS — D649 Anemia, unspecified: Secondary | ICD-10-CM | POA: Insufficient documentation

## 2016-12-18 DIAGNOSIS — C20 Malignant neoplasm of rectum: Secondary | ICD-10-CM

## 2016-12-18 DIAGNOSIS — C7800 Secondary malignant neoplasm of unspecified lung: Secondary | ICD-10-CM

## 2016-12-18 MED ORDER — SODIUM CHLORIDE 0.9% FLUSH: 10.0000 mL | INTRAVENOUS | Status: DC | PRN

## 2016-12-18 MED ORDER — CYANOCOBALAMIN 1000 MCG/ML IJ SOLN
INTRAMUSCULAR | Status: AC
  Filled 2016-12-18: qty 1

## 2016-12-18 MED ORDER — CYANOCOBALAMIN 1000 MCG/ML IJ SOLN
1000.0000 ug | Freq: Once | INTRAMUSCULAR | Status: AC
  Administered 2016-12-18: 1000 ug via INTRAMUSCULAR

## 2016-12-18 MED ORDER — HEPARIN SOD (PORK) LOCK FLUSH 100 UNIT/ML IV SOLN: 500.0000 [IU] | Freq: Once | INTRAVENOUS | Status: DC

## 2016-12-18 NOTE — Progress Notes (Signed)
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      03/25/2013 - 05/05/2013 Chemotherapy    Xeloda 1500 mg BID 5 days/week with radiation therapy.      07/26/2013 Definitive Surgery    Dr. Arnoldo Morale- Flexible sigmoidoscopy, abdominoperineal resection, total abdominal hysterectomy with bilateral salpingo-oophorectomy      08/30/2013 - 03/01/2014 Chemotherapy    Xeloda 1000 mg BID 14 days on and 7 days off x 6 months      03/08/2014 Imaging    CT CAP- Bilateral pulmonary nodules, including an 8 mm left lower lobe nodule. Metastatic disease is a concern. The left lower lobe nodule has progressed since 03/05/2013, when it measured 5 mm.      05/26/2014 Imaging    CT Chest- Continued enlargement of the superior segment left lower lobe nodule. Malignancy is likely.   In contrast, the right lower lobe nodule is probably benign.      08/17/2014 Imaging    CT- Superior segment left lower lobe pulmonary nodule measures stable since the most recent comparison study, but is again noted to have increased in size when comparing to older studies. Continued close attention will be required as neoplasm remains a con      10/26/2014 Surgery    thoracoscopic left lower lobe superior segmentectomy. Pathology with metastatic adenocarcinoma c/w colonic primary      01/04/2015 - 06/27/2015 Chemotherapy    FOLFOX+Avastin (Avastin  started on 01/18/2015).      01/09/2015 Imaging    CT CAP- Interval wedge resection of metastasis within the left lower lobe. No evidence of new metastatic disease within the chest, abdomen or pelvis.      02/15/2015 Treatment Plan Change    Treatment deferred due to renal function change (patient poor historian) with N/V.      02/22/2015 Treatment Plan Change    Added Aranesp at renal dosing to supportive therpay plan.      03/08/2015 Treatment Plan Change    Defer treatment x 1 week      04/12/2015 Adverse Reaction    Patient reported mouth sores.  None on exam.        04/12/2015 Treatment Plan Change    5FU bolus is discontinued for "mouth sores"      04/12/2015 Adverse Reaction    Increasing Creatinine Cl.  CrCl calculated to be 30.7.        04/12/2015 Treatment Plan Change    Oxaliplatin dose reduced by 20% to 65 mg/m2 based upon creatinine clearance.      05/24/2015 Treatment Plan Change    5FU CI decreased by 15%.      08/29/2015 Imaging    CT CAP- Status post APR with left lower quadrant colostomy, and some stable presacral scarring. No definite evidence of metastatic disease in the CAP.  There is a new 3 mm nodule in the right lower  lobe which is highly nonspecific. Attention on follow-up.      01/03/2016 Imaging    CT chest- No definite findings to suggest metastatic disease to the lungs. Previously noted 3 mm right lower lobe pulmonary nodule stable, favored to be benign.      08/22/2016 Imaging    CT CAP-1. Abdominoperineal resection with stable scarring and left lower quadrant colostomy. 2. 4 mm right lower lobe nodule, stable. 3. 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        HISTORY OF PRESENT ILLNESS:  (From Kirby Crigler, PA-C's last note on 02/26/16)     INTERVAL HISTORY:  [name] 66 y.o. female returns for routine follow-up for metastatic rectal cancer.   She is here today with one of her caregivers from  [facility] group home. Patient states that she is doing well. She denies any chest pain, shortness breath, abdominal pain. She denies noting any blood in her colostomy. Her weight has been stable and her appetite is good. Patient previously had an episode of cellulitis on her legs but it has cleared up. She does have chronic lower extremity edema.  REVIEW OF SYSTEMS:  Review of Systems  Constitutional: Negative.  Negative for chills, fatigue and fever.  HENT:  Negative.  Negative for lump/mass and nosebleeds.   Eyes: Negative.   Respiratory: Negative.  Negative for cough and shortness of breath.   Cardiovascular: Positive for leg swelling. Negative for chest pain.  Gastrointestinal: Negative.  Negative for abdominal pain, blood in stool, constipation, diarrhea, nausea and vomiting.  Endocrine: Negative.   Genitourinary: Negative.  Negative for dysuria and hematuria.   Musculoskeletal: Negative.  Negative for arthralgias.  Skin: Negative.  Negative for rash.  Neurological: Negative.  Negative for dizziness and headaches.  Hematological: Negative.  Negative for adenopathy. Does not bruise/bleed easily.  Psychiatric/Behavioral: Negative.  Negative for depression and sleep disturbance. The patient is not nervous/anxious.      PAST MEDICAL/SURGICAL HISTORY:  Past Medical History:  Diagnosis Date  . Arthritis   . Asthma   . Cancer (HCC)    Rectal   . Chronic renal disease, stage 3, moderately decreased glomerular filtration rate between 30-59 mL/min/1.73 square meter 04/27/2014  . Depression   . Diabetes mellitus    years  . GERD (gastroesophageal reflux disease)   . Gout   . History of recurrent UTIs   . Hypertension   . Iron deficiency 04/27/2014  . Mental retardation    stopped school at 9th grade   . Numbness and tingling in hands    x several months   . Rectal cancer ([place])   . Seizures ([place])    more than 4 yrs since last seizure. UNknown etiology  . Shortness of breath     with exertion  . Vitamin B 12 deficiency 02/01/2015   Incidentally found without antibody testing.     Past Surgical History:  Procedure Laterality Date  . ABDOMINAL HYSTERECTOMY    . ABDOMINAL PERINEAL BOWEL RESECTION N/A 07/26/2013   Procedure:  ABDOMINAL PERINEAL RESECTION;  Surgeon: Jamesetta So, MD;  Location: [facility];  Service: General;  Laterality: N/A;  . COLON SURGERY  2015   bowel resection/w colostomy  . COLONOSCOPY N/A 02/04/2013   Procedure: COLONOSCOPY;  Surgeon: Rogene Houston, MD;  Location: [facility];  Service: Endoscopy;  Laterality: N/A;  225  . COLONOSCOPY N/A 05/12/2014   Procedure: COLONOSCOPY;  Surgeon: Rogene Houston, MD;  Location: [facility];  Service: Endoscopy;  Laterality: N/A;  1030  . COLONOSCOPY WITH ESOPHAGOGASTRODUODENOSCOPY (EGD) N/A 01/14/2013   Procedure: COLONOSCOPY WITH ESOPHAGOGASTRODUODENOSCOPY (EGD);  Surgeon: Rogene Houston, MD;  [facility];  Service: Endoscopy;  Laterality: N/A;  250-moved to 315 Ann to notify pt  . COLOSTOMY Left 07/26/2013   Procedure: COLOSTOMY;  Surgeon: Jamesetta So, MD;  Location: [facility];  Service: General;  Laterality: Left;  . EUS N/A 02/18/2013   Procedure: LOWER ENDOSCOPIC ULTRASOUND (EUS);  Surgeon: Milus Banister, MD;  Location: [facility];  Service: Endoscopy;  Laterality: N/A;  . FLEXIBLE SIGMOIDOSCOPY N/A 07/26/2013   Procedure: FLEXIBLE SIGMOIDOSCOPY;  Surgeon: Jamesetta So, MD;  Location: [facility];  Service: General;  Laterality: N/A;  . LYMPH NODE DISSECTION Left 10/26/2014   Procedure: LYMPH NODE DISSECTION;  Surgeon: [name], MD;  Location: [facility];  Service: Thoracic;  Laterality: Left;  [name] MULTIPLE EXTRACTIONS WITH ALVEOLOPLASTY N/A 11/23/2012   Procedure: MULTIPLE EXTRACION 1, 2, 4, 5, 6, 7, 8, 9, 10, 11, 12, 13, 14, 17, 18, 20, 23, 24, 25, 26, 28, 29, 32 WITH ALVEOLOPLASTY, REMOVE BILATERAL TORI;  Surgeon: Gae Bon, DDS;  Location: [facility];  Service: Oral Surgery;   Laterality: N/A;  . PORTACATH PLACEMENT Left 01/02/2015   Procedure: INSERTION PORT-A-CATH;  Surgeon: Aviva Signs, MD;  Location: [facility];  Service: General;  Laterality: Left;  . SALPINGOOPHORECTOMY Bilateral 07/26/2013   Procedure: SALPINGO OOPHORECTOMY;  Surgeon: Jamesetta So, MD;  Location: [facility];  Service: General;  Laterality: Bilateral;  . SEGMENTECOMY Left 10/26/2014   Procedure: LEFT LOWER LOBE SUPERIOR SEGMENTECTOMY;  Surgeon: [name], MD;  Location: [facility];  Service: Thoracic;  Laterality: Left;  . SUPRACERVICAL ABDOMINAL HYSTERECTOMY N/A 07/26/2013   Procedure: HYSTERECTOMY SUPRACERVICAL ABDOMINAL ;  Surgeon: Jamesetta So, MD;  Location: [facility];  Service: General;  Laterality: N/A;  . VIDEO ASSISTED THORACOSCOPY Left 10/26/2014   Procedure: LEFT VIDEO ASSISTED THORACOSCOPY;  Surgeon: [name], MD;  Location: [facility];  Service: Thoracic;  Laterality: Left;     SOCIAL HISTORY:  Social History   Social History  . Marital status: Single    Spouse name: N/A  . Number of children: N/A  . Years of education: N/A   Occupational History  . Not on file.   Social History Main Topics  . Smoking status: Former Smoker    Types: Cigarettes    Quit date: 07/16/2008  . Smokeless tobacco: Never Used     Comment: unknown when stopped smoking      long time  . Alcohol use No  . Drug use: No  . Sexual activity: Not on file   Other Topics Concern  . Not on file   Social History Narrative  . No narrative on file    FAMILY HISTORY:  No family history on file.  CURRENT MEDICATIONS:  Outpatient Encounter Prescriptions as of 12/18/2016  Medication Sig Note  . amLODipine (NORVASC) 10 MG tablet Take 1 tablet (10 mg total) by mouth every morning.   [name] aspirin EC 81 MG tablet Take 81 mg by mouth daily.   . calcium carbonate (TUMS) 500 MG chewable tablet Chew 2 tablets (400 mg of elemental calcium total) by mouth 3 (three) times daily.   [name] docusate sodium (COLACE)  100 MG capsule Take 100 mg by mouth 2 (two) times daily.   . ferrous sulfate 325 (65 FE) MG tablet TAKE (1) TABLET  BY MOUTH TWICE DAILY.   . fish oil-omega-3 fatty acids 1000 MG capsule Take 1 g by mouth 2 (two) times daily.   . Fluticasone-Salmeterol (ADVAIR) 250-50 MCG/DOSE AEPB Inhale 1 puff into the lungs every 12 (twelve) hours.   . furosemide (LASIX) 40 MG tablet Take 40 mg by mouth daily.   [name] glucose blood (ACCU-CHEK AVIVA PLUS) test strip 1 each by Other route daily. Use as instructed   . Insulin Glargine (LANTUS SOLOSTAR) 100 UNIT/ML SOPN Inject 12 Units into the skin at bedtime.  10/24/2014: .  . ipratropium-albuterol (DUONEB) 0.5-2.5 (3) MG/3ML SOLN  03/08/2015: Received from: External Pharmacy  . loratadine-pseudoephedrine (LORATADINE-D 24HR) 10-240 MG per 24 hr tablet Take 1 tablet by mouth daily.   . magnesium oxide (MAG-OX) 400 (241.3 Mg) MG tablet Take 1 tablet (400 mg total) by mouth daily.   . metFORMIN (GLUCOPHAGE) 1000 MG tablet Take 1,000 mg by mouth 2 (two) times daily with a meal.   . metoprolol succinate (TOPROL-XL) 50 MG 24 hr tablet Take 100 mg by mouth at bedtime.  10/24/2014: .  . montelukast (SINGULAIR) 10 MG tablet Take 10 mg by mouth at bedtime.   [name] NOVOFINE AUTOCOVER 30G X 8 MM MISC  05/24/2015: Received from: External Pharmacy  . OS-CAL CALCIUM + D3 500-200 MG-UNIT TABS TAKE 2 TABLETS BY MOUTH DAILY WITH BREAKFAST.   . pantoprazole (PROTONIX) 40 MG tablet Take 40 mg by mouth every morning.    . phenytoin (DILANTIN) 100 MG ER capsule Take 100 mg by mouth 2 (two) times daily. 02/03/2013: .  . potassium chloride SA (K-DUR,KLOR-CON) 20 MEQ tablet Take 20 mEq by mouth daily. Reported on 07/07/2015   . pravastatin (PRAVACHOL) 40 MG tablet Take 40 mg by mouth every evening.    [name] PROAIR HFA 108 (90 BASE) MCG/ACT inhaler  12/27/2014: Received from: External Pharmacy  . senna-docusate (SENOKOT-S) 8.6-50 MG tablet Take 1 tablet by mouth 2 (two) times daily as needed for mild  constipation.   . sodium polystyrene (KAYEXALATE) powder Take by mouth once.   . traMADol (ULTRAM) 50 MG tablet Take 1 tablet (50 mg total) by mouth every 4 (four) hours as needed for moderate pain.   . traZODone (DESYREL) 50 MG tablet  02/15/2015: Received from: External Pharmacy  . triamcinolone cream (KENALOG) 0.1 % Apply 1 application topically 2 (two) times daily. To stoma    Facility-Administered Encounter Medications as of 12/18/2016  Medication  . heparin lock flush 100 unit/mL  . sodium chloride flush (NS) 0.9 % injection 10 mL    ALLERGIES:  No Known Allergies   PHYSICAL EXAM:  ECOG Performance status: 1-2 - Requires assistance given cognitive delay; lives at group home.   Vitals:   12/18/16 0945  BP: (!) 131/56  Pulse: 86  Resp: 16  SpO2: 99%   There were no vitals filed for this visit.  Physical Exam  Constitutional: She is oriented to person, place, and time and well-developed, well-nourished, and in no distress.  Seen seated in wheelchair.   HENT:  Head: Normocephalic.  Mouth/Throat: Oropharynx is clear and moist. No oropharyngeal exudate.  Eyes: Pupils are equal, round, and reactive to light. Conjunctivae are normal. No scleral icterus.  Neck: Normal range of motion.  Cardiovascular: Normal rate, regular rhythm and normal heart sounds.   Pulmonary/Chest: Effort normal and breath sounds normal. No respiratory distress. She has no wheezes. She has no rales.  Abdominal: Soft. She exhibits no distension. There is no tenderness. There  is no rebound and no guarding.  -LLQ abd ostomy in place    Musculoskeletal: Normal range of motion. She exhibits edema (2+ BLE/ankle edema ).  Ambulates with walker at group home.   Lymphadenopathy:    She has no cervical adenopathy.  Neurological: She is alert and oriented to person, place, and time.  Skin: Skin is warm and dry. No rash noted.  Psychiatric: Mood and affect normal.  Apparent cognitive delay      LABORATORY  DATA:  I have reviewed the labs as listed.  CBC    Component Value Date/Time   WBC 4.8 07/23/2016 1144   RBC 3.63 (L) 07/23/2016 1144   HGB 10.1 (L) 07/23/2016 1144   HCT 32.0 (L) 07/23/2016 1144   PLT 200 07/23/2016 1144   MCV 88.2 07/23/2016 1144   MCH 27.8 07/23/2016 1144   MCHC 31.6 07/23/2016 1144   RDW 14.4 07/23/2016 1144   LYMPHSABS 0.7 07/23/2016 1144   MONOABS 0.4 07/23/2016 1144   EOSABS 0.2 07/23/2016 1144   BASOSABS 0.0 07/23/2016 1144   CMP Latest Ref Rng & Units 07/23/2016 05/29/2016 02/26/2016  Glucose 65 - 99 mg/dL 79 143(H) 74  BUN 6 - 20 mg/dL 46(H) 47(H) 44(H)  Creatinine 0.44 - 1.00 mg/dL 1.42(H) 1.46(H) 1.42(H)  Sodium 135 - 145 mmol/L 139 134(L) 135  Potassium 3.5 - 5.1 mmol/L 4.6 4.5 4.9  Chloride 101 - 111 mmol/L 100(L) 97(L) 98(L)  CO2 22 - 32 mmol/L 29 29 29   Calcium 8.9 - 10.3 mg/dL 9.0 8.8(L) 9.4  Total Protein 6.5 - 8.1 g/dL 7.3 7.1 7.2  Total Bilirubin 0.3 - 1.2 mg/dL 0.3 0.3 0.4  Alkaline Phos 38 - 126 U/L 340(H) 297(H) 300(H)  AST 15 - 41 U/L 22 18 19   ALT 14 - 54 U/L 22 19 15     PENDING LABS:    DIAGNOSTIC IMAGING:  *The following radiologic images and reports have been reviewed independently and agree with below findings.  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 Subsequent CT imaging revealed enlarging LLL pulmonary nodule. She underwent LLL resection with path confirming metastatic adenocarcinoma consistent with colon primary. Started chemotherapy with FOLFOX/Avastin in 12/2014. Chemotherapy course complicated by several treatment deferments for adverse side effects requiring dose  reductions/discontinuations.   -CEA levels have been stable; CEA pending today.  -Reviewed surveillance CT chest/abd/pelvis in 08/2016 with the patient; she has no evidence of recurrence. -Will refer to Dr. Laural Golden with GI for colonoscopy consideration based on national guidelines for rectal cancer surveillance. Explained the purpose of colonoscopy in patients with history of rectal cancer. She does have colostomy; no GI complaints. Given the patient's cognitive delay, I will defer to Dr. Olevia Perches medical opinion if he thinks colonoscopy is appropriate at this time.  -Return to cancer center in 4 months for continued surveillance.    NCCN Guidelines reviewed:    Low vitamin B12:  -Continue monthly B12 injections at cancer center.     Dispo:  -CT chest/abd/pelvis prior to next visit. -Continue monthly B12 injections. -Continue every 2 month port flushes.  -Return to cancer center for follow-up visit in 4 months.    All  questions were answered to patient's stated satisfaction. Encouraged patient to call with any new concerns or questions before her next visit to the cancer center and we can certain see her sooner, if needed.      Orders placed this encounter:  Orders Placed This Encounter  Procedures  . CT Abdomen Pelvis W Contrast  . CT Chest W Contrast  . CBC with Differential  . Comprehensive metabolic panel  . CEA    Twana First, MD

## 2016-12-18 NOTE — Progress Notes (Signed)
[name] presents today for injection per the provider's orders.  B12 administration without incident; see MAR for injection details.  Patient tolerated procedure well and without incident.  No questions or complaints noted at this time. Discharged via wheelchair in c/o caretaker.

## 2017-01-13 ENCOUNTER — Encounter (HOSPITAL_COMMUNITY): Payer: Medicare Other

## 2017-01-20 ENCOUNTER — Encounter (HOSPITAL_COMMUNITY): Payer: Medicare Other | Attending: Hematology and Oncology

## 2017-01-20 ENCOUNTER — Encounter (HOSPITAL_COMMUNITY): Payer: Self-pay

## 2017-01-20 VITALS — BP 149/72 | HR 88 | Temp 97.6°F | Resp 16

## 2017-01-20 DIAGNOSIS — D649 Anemia, unspecified: Secondary | ICD-10-CM | POA: Insufficient documentation

## 2017-01-20 DIAGNOSIS — C20 Malignant neoplasm of rectum: Secondary | ICD-10-CM | POA: Diagnosis not present

## 2017-01-20 DIAGNOSIS — E538 Deficiency of other specified B group vitamins: Secondary | ICD-10-CM | POA: Diagnosis not present

## 2017-01-20 MED ORDER — HEPARIN SOD (PORK) LOCK FLUSH 100 UNIT/ML IV SOLN
500.0000 [IU] | Freq: Once | INTRAVENOUS | Status: AC
  Administered 2017-01-20: 500 [IU] via INTRAVENOUS

## 2017-01-20 MED ORDER — HEPARIN SOD (PORK) LOCK FLUSH 100 UNIT/ML IV SOLN
INTRAVENOUS | Status: AC
  Filled 2017-01-20: qty 5

## 2017-01-20 MED ORDER — CYANOCOBALAMIN 1000 MCG/ML IJ SOLN
INTRAMUSCULAR | Status: AC
  Filled 2017-01-20: qty 1

## 2017-01-20 MED ORDER — SODIUM CHLORIDE 0.9% FLUSH
10.0000 mL | Freq: Once | INTRAVENOUS | Status: AC
  Administered 2017-01-20: 10 mL via INTRAVENOUS

## 2017-01-20 MED ORDER — CYANOCOBALAMIN 1000 MCG/ML IJ SOLN
1000.0000 ug | Freq: Once | INTRAMUSCULAR | Status: AC
  Administered 2017-01-20: 1000 ug via INTRAMUSCULAR

## 2017-01-20 NOTE — Patient Instructions (Signed)
[place] at [facility]  Discharge Instructions:  Your port was flushed today and you received a vitamin b12 shot today.  _______________________________________________________________  Thank you for choosing [place] at [facility] to provide your oncology and hematology care.  To afford each patient quality time with our providers, please arrive at least 15 minutes before your scheduled appointment.  You need to re-schedule your appointment if you arrive 10 or more minutes late.  We strive to give you quality time with our providers, and arriving late affects you and other patients whose appointments are after yours.  Also, if you no show three or more times for appointments you may be dismissed from the clinic.  Again, thank you for choosing [place] at [place] hope is that these requests will allow you access to exceptional care and in a timely manner. _______________________________________________________________  If you have questions after your visit, please contact our office at [phone] between the hours of 8:30 a.m. and 5:00 p.m. Voicemails left after 4:30 p.m. will not be returned until the following business day. _______________________________________________________________  For prescription refill requests, have your pharmacy contact our office. _______________________________________________________________  Recommendations made by the consultant and any test results will be sent to your referring physician. _______________________________________________________________

## 2017-01-20 NOTE — Progress Notes (Signed)
Patient to treatment area for B12 shot and port flush.  Port flushed per protocol.  Site clean and dry with no bruising or swelling noted at site.  Band aid applied.  Vitamin b12 injection site clean and dry with no bruising or swelling noted at site.  Band aid applied.  Patient VSS and discharged via wheelchair with caregiver.  No s/s of distress noted.

## 2017-02-18 ENCOUNTER — Encounter (HOSPITAL_COMMUNITY): Payer: Medicare Other | Attending: Hematology and Oncology

## 2017-02-18 ENCOUNTER — Ambulatory Visit (HOSPITAL_COMMUNITY): Payer: Medicare Other

## 2017-02-18 ENCOUNTER — Encounter (HOSPITAL_COMMUNITY): Payer: Self-pay

## 2017-02-18 VITALS — BP 160/61 | HR 79 | Temp 98.2°F | Resp 18

## 2017-02-18 DIAGNOSIS — C20 Malignant neoplasm of rectum: Secondary | ICD-10-CM | POA: Insufficient documentation

## 2017-02-18 DIAGNOSIS — E538 Deficiency of other specified B group vitamins: Secondary | ICD-10-CM

## 2017-02-18 DIAGNOSIS — D649 Anemia, unspecified: Secondary | ICD-10-CM | POA: Insufficient documentation

## 2017-02-18 MED ORDER — CYANOCOBALAMIN 1000 MCG/ML IJ SOLN
1000.0000 ug | Freq: Once | INTRAMUSCULAR | Status: AC
  Administered 2017-02-18: 1000 ug via INTRAMUSCULAR

## 2017-02-18 NOTE — Progress Notes (Signed)
[name] tolerated Vit B12 injection well without complaints or incident. VSS Pt discharged via wheelchair in satisfactory condition accompanied by her caregiver

## 2017-02-18 NOTE — Patient Instructions (Signed)
[facility] at [facility] Discharge Instructions  RECOMMENDATIONS MADE BY THE CONSULTANT AND ANY TEST RESULTS WILL BE SENT TO YOUR REFERRING PHYSICIAN.  Received Vit B12 injection today. Follow-up as scheduled. Call clinic for any questions or concerns  Thank you for choosing [facility] at [facility] to provide your oncology and hematology care.  To afford each patient quality time with our provider, please arrive at least 15 minutes before your scheduled appointment time.    If you have a lab appointment with the Cancer Center please come in thru the  Main Entrance and check in at the main information desk  You need to re-schedule your appointment should you arrive 10 or more minutes late.  We strive to give you quality time with our providers, and arriving late affects you and other patients whose appointments are after yours.  Also, if you no show three or more times for appointments you may be dismissed from the clinic at the providers discretion.     Again, thank you for choosing [facility].  Our hope is that these requests will decrease the amount of time that you wait before being seen by our physicians.       _____________________________________________________________  Should you have questions after your visit to [facility], please contact our office at [phone] between the hours of 8:30 a.m. and 4:30 p.m.  Voicemails left after 4:30 p.m. will not be returned until the following business day.  For prescription refill requests, have your pharmacy contact our office.       Resources For Cancer Patients and their Caregivers ? American Cancer Society: Can assist with transportation, wigs, general needs, runs Look Good Feel Better.        1-888-227-6333 ? Cancer Care: Provides financial assistance, online support groups, medication/co-pay assistance.  1-800-813-HOPE (4673) ? [facility] Assists [place] cancer patients and their families through emotional , educational and financial support.  336-427-4357 ? [place] DSS Where to apply for food stamps, Medicaid and utility assistance. 336-342-1394 ? [facility]: Transportation to medical appointments. 336-347-2287 ? Social Security Administration: May apply for disability if have a Stage IV cancer. 336-342-7796 1-800-772-1213 ? [place] [facility], Disability and Transit Services: Assists with nutrition, care and transit needs. 336-349-2343  Cancer Center Support Programs: @10RELATIVEDAYS@ > Cancer Support Group  2nd Tuesday of the month 1pm-2pm, Journey Room  > Creative Journey  3rd Tuesday of the month 1130am-1pm, Journey Room  > Look Good Feel Better  1st Wednesday of the month 10am-12 noon, Journey Room (Call [employer] to register 1-800-395-5775)   

## 2017-02-27 ENCOUNTER — Other Ambulatory Visit (HOSPITAL_COMMUNITY): Payer: Self-pay | Admitting: Oncology

## 2017-03-21 ENCOUNTER — Inpatient Hospital Stay (HOSPITAL_COMMUNITY): Payer: Medicare Other | Attending: Oncology

## 2017-03-21 ENCOUNTER — Encounter (HOSPITAL_COMMUNITY): Payer: Self-pay

## 2017-03-21 ENCOUNTER — Other Ambulatory Visit: Payer: Self-pay

## 2017-03-21 VITALS — BP 147/55 | HR 70 | Temp 98.2°F | Resp 18

## 2017-03-21 DIAGNOSIS — E538 Deficiency of other specified B group vitamins: Secondary | ICD-10-CM

## 2017-03-21 DIAGNOSIS — Z95828 Presence of other vascular implants and grafts: Secondary | ICD-10-CM

## 2017-03-21 DIAGNOSIS — C20 Malignant neoplasm of rectum: Secondary | ICD-10-CM

## 2017-03-21 MED ORDER — HEPARIN SOD (PORK) LOCK FLUSH 100 UNIT/ML IV SOLN
500.0000 [IU] | Freq: Once | INTRAVENOUS | Status: AC
  Administered 2017-03-21: 500 [IU] via INTRAVENOUS

## 2017-03-21 MED ORDER — SODIUM CHLORIDE 0.9% FLUSH
10.0000 mL | INTRAVENOUS | Status: DC | PRN
  Administered 2017-03-21: 20 mL via INTRAVENOUS
  Filled 2017-03-21: qty 10

## 2017-03-21 MED ORDER — CYANOCOBALAMIN 1000 MCG/ML IJ SOLN
1000.0000 ug | Freq: Once | INTRAMUSCULAR | Status: AC
  Administered 2017-03-21: 1000 ug via INTRAMUSCULAR

## 2017-03-21 NOTE — Progress Notes (Signed)
[name] presented for Portacath access and flush.  Proper placement of portacath confirmed by CXR.  Portacath located left chest wall accessed with  H 20 needle.  Good blood return present. Portacath flushed with 42ml NS and 500U/57ml Heparin and needle removed intact.  Procedure tolerated well and without incident.  [name] presents today for injection per the provider's orders.  B12 administration without incident; see MAR for injection details.  Patient tolerated procedure well and without incident.  No questions or complaints noted at this time.  Discharged via wheelchair in c/o caregiver.

## 2017-04-17 ENCOUNTER — Ambulatory Visit (HOSPITAL_COMMUNITY)
Admission: RE | Admit: 2017-04-17 | Discharge: 2017-04-17 | Disposition: A | Discharge status: Home / Self Care | Payer: Medicare Other | Source: Ambulatory Visit | Attending: Oncology | Admitting: Oncology

## 2017-04-17 DIAGNOSIS — C7800 Secondary malignant neoplasm of unspecified lung: Secondary | ICD-10-CM | POA: Diagnosis not present

## 2017-04-17 DIAGNOSIS — Z933 Colostomy status: Secondary | ICD-10-CM | POA: Diagnosis not present

## 2017-04-17 DIAGNOSIS — R911 Solitary pulmonary nodule: Secondary | ICD-10-CM | POA: Diagnosis not present

## 2017-04-17 DIAGNOSIS — C20 Malignant neoplasm of rectum: Secondary | ICD-10-CM

## 2017-04-17 LAB — POCT I-STAT CREATININE: Creatinine, Ser: 1.4 mg/dL — ABNORMAL HIGH (ref 0.44–1.00)

## 2017-04-17 MED ORDER — IOPAMIDOL (ISOVUE-300) INJECTION 61%
100.0000 mL | Freq: Once | INTRAVENOUS | Status: AC | PRN
  Administered 2017-04-17: 100 mL via INTRAVENOUS

## 2017-04-18 ENCOUNTER — Ambulatory Visit (HOSPITAL_COMMUNITY): Payer: Medicare Other

## 2017-04-23 ENCOUNTER — Inpatient Hospital Stay (HOSPITAL_COMMUNITY): Payer: Medicare Other

## 2017-04-23 ENCOUNTER — Inpatient Hospital Stay (HOSPITAL_COMMUNITY): Payer: Medicare Other | Attending: Oncology | Admitting: Adult Health

## 2017-04-23 ENCOUNTER — Encounter (HOSPITAL_COMMUNITY): Payer: Self-pay | Admitting: Adult Health

## 2017-04-23 VITALS — BP 160/56 | HR 69 | Resp 16 | Wt 250.0 lb

## 2017-04-23 DIAGNOSIS — E538 Deficiency of other specified B group vitamins: Secondary | ICD-10-CM

## 2017-04-23 DIAGNOSIS — R918 Other nonspecific abnormal finding of lung field: Secondary | ICD-10-CM

## 2017-04-23 DIAGNOSIS — R911 Solitary pulmonary nodule: Secondary | ICD-10-CM

## 2017-04-23 DIAGNOSIS — R0989 Other specified symptoms and signs involving the circulatory and respiratory systems: Secondary | ICD-10-CM

## 2017-04-23 DIAGNOSIS — C20 Malignant neoplasm of rectum: Secondary | ICD-10-CM

## 2017-04-23 DIAGNOSIS — C7801 Secondary malignant neoplasm of right lung: Secondary | ICD-10-CM | POA: Insufficient documentation

## 2017-04-23 DIAGNOSIS — C7800 Secondary malignant neoplasm of unspecified lung: Secondary | ICD-10-CM

## 2017-04-23 LAB — COMPREHENSIVE METABOLIC PANEL
ALBUMIN: 3.6 g/dL (ref 3.5–5.0)
ALT: 17 U/L (ref 14–54)
AST: 18 U/L (ref 15–41)
Alkaline Phosphatase: 342 U/L — ABNORMAL HIGH (ref 38–126)
Anion gap: 12 (ref 5–15)
BUN: 38 mg/dL — AB (ref 6–20)
CHLORIDE: 99 mmol/L — AB (ref 101–111)
CO2: 26 mmol/L (ref 22–32)
CREATININE: 1.29 mg/dL — AB (ref 0.44–1.00)
Calcium: 8.8 mg/dL — ABNORMAL LOW (ref 8.9–10.3)
GFR calc Af Amer: 49 mL/min — ABNORMAL LOW (ref >60)
GFR, EST NON AFRICAN AMERICAN: 42 mL/min — AB (ref >60)
GLUCOSE: 206 mg/dL — AB (ref 65–99)
POTASSIUM: 4.5 mmol/L (ref 3.5–5.1)
Sodium: 137 mmol/L (ref 135–145)
Total Bilirubin: 0.2 mg/dL — ABNORMAL LOW (ref 0.3–1.2)
Total Protein: 7 g/dL (ref 6.5–8.1)

## 2017-04-23 LAB — CBC WITH DIFFERENTIAL/PLATELET
Basophils Absolute: 0 10*3/uL (ref 0.0–0.1)
Basophils Relative: 0 %
EOS ABS: 0.2 10*3/uL (ref 0.0–0.7)
EOS PCT: 4 %
HCT: 31.6 % — ABNORMAL LOW (ref 36.0–46.0)
Hemoglobin: 9.7 g/dL — ABNORMAL LOW (ref 12.0–15.0)
LYMPHS ABS: 0.6 10*3/uL — AB (ref 0.7–4.0)
LYMPHS PCT: 14 %
MCH: 27.3 pg (ref 26.0–34.0)
MCHC: 30.7 g/dL (ref 30.0–36.0)
MCV: 89 fL (ref 78.0–100.0)
MONO ABS: 0.3 10*3/uL (ref 0.1–1.0)
MONOS PCT: 7 %
Neutro Abs: 3.4 10*3/uL (ref 1.7–7.7)
Neutrophils Relative %: 75 %
PLATELETS: 177 10*3/uL (ref 150–400)
RBC: 3.55 MIL/uL — ABNORMAL LOW (ref 3.87–5.11)
RDW: 14.4 % (ref 11.5–15.5)
WBC: 4.6 10*3/uL (ref 4.0–10.5)

## 2017-04-23 MED ORDER — SODIUM CHLORIDE 0.9% FLUSH
10.0000 mL | INTRAVENOUS | Status: DC | PRN
  Administered 2017-04-23: 10 mL via INTRAVENOUS
  Filled 2017-04-23: qty 10

## 2017-04-23 MED ORDER — CYANOCOBALAMIN 1000 MCG/ML IJ SOLN
1000.0000 ug | Freq: Once | INTRAMUSCULAR | Status: AC
  Administered 2017-04-23: 1000 ug via INTRAMUSCULAR

## 2017-04-23 MED ORDER — CYANOCOBALAMIN 1000 MCG/ML IJ SOLN
INTRAMUSCULAR | Status: AC
  Filled 2017-04-23: qty 1

## 2017-04-23 MED ORDER — HEPARIN SOD (PORK) LOCK FLUSH 100 UNIT/ML IV SOLN
500.0000 [IU] | Freq: Once | INTRAVENOUS | Status: AC
  Administered 2017-04-23: 500 [IU] via INTRAVENOUS

## 2017-04-23 NOTE — Patient Instructions (Signed)
[place] at [facility] Discharge Instructions  RECOMMENDATIONS MADE BY THE CONSULTANT AND ANY TEST RESULTS WILL BE SENT TO YOUR REFERRING PHYSICIAN.  Seen by Mike Craze NP today. Port flush with labs done today.  B12 injection given Follow up in 4 months with MD, port flush with labs again.  Thank you for choosing [place] at [facility] to provide your oncology and hematology care.  To afford each patient quality time with our provider, please arrive at least 15 minutes before your scheduled appointment time.    If you have a lab appointment with the [facility] please come in thru the  Main Entrance and check in at the main information desk  You need to re-schedule your appointment should you arrive 10 or more minutes late.  We strive to give you quality time with our providers, and arriving late affects you and other patients whose appointments are after yours.  Also, if you no show three or more times for appointments you may be dismissed from the clinic at the providers discretion.     Again, thank you for choosing [facility].  Our hope is that these requests will decrease the amount of time that you wait before being seen by our physicians.       _____________________________________________________________  Should you have questions after your visit to [facility], please contact our office at [phone] between the hours of 8:30 a.m. and 4:30 p.m.  Voicemails left after 4:30 p.m. will not be returned until the following business day.  For prescription refill requests, have your pharmacy contact our office.       Resources For Cancer Patients and their Caregivers ? American Cancer Society: Can assist with transportation, wigs, general needs, runs Look Good Feel Better.        7702304275 ? Cancer Care: Provides financial assistance, online support groups, medication/co-pay assistance.   1-800-813-HOPE (703)625-7476) ? [facility] Assists [place] cancer patients and their families through emotional , educational and financial support.  912-179-3118 ? [place] DSS Where to apply for food stamps, Medicaid and utility assistance. 979-280-0238 ? [facility]: Transportation to medical appointments. 334-307-8128 ? Social Security Administration: May apply for disability if have a Stage IV cancer. 559-476-8331 (585)628-4231 ? [employer], Disability and Transit Services: Assists with nutrition, care and transit needs. [place] Support Programs: @10RELATIVEDAYS @ > Cancer Support Group  2nd Tuesday of the month 1pm-2pm, Journey Room  > Creative Journey  3rd Tuesday of the month 1130am-1pm, Journey Room  > Look Good Feel Better  1st Wednesday of the month 10am-12 noon, Journey Room (Call [facility] to register [phone])

## 2017-04-23 NOTE — Progress Notes (Signed)
[name] presents today for injection per MD orders. B12 1,000 mcg administered IM  in left Upper Arm. Administration without incident. Patient tolerated well.  [name] presented for Portacath access and flush. Portacath located left chest wall accessed with  H 20 needle. Good blood return present. Portacath flushed with 38ml NS and 500U/64ml Heparin and needle removed intact. Procedure without incident. Patient tolerated procedure well. Labs drawn also.  Treatment given per orders. Patient tolerated it well without problems. Vitals stable and discharged home from clinic via wheelchair. Follow up as scheduled.

## 2017-04-23 NOTE — Progress Notes (Signed)
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      03/25/2013 - 05/05/2013 Chemotherapy    Xeloda 1500 mg BID 5 days/week with radiation therapy.      07/26/2013 Definitive Surgery    Dr. Arnoldo Morale- Flexible sigmoidoscopy, abdominoperineal resection, total abdominal hysterectomy with bilateral salpingo-oophorectomy      08/30/2013 - 03/01/2014 Chemotherapy    Xeloda 1000 mg BID 14 days on and 7 days off x 6 months      03/08/2014 Imaging    CT CAP- Bilateral pulmonary nodules, including an 8 mm left lower lobe nodule. Metastatic disease is a concern. The left lower lobe nodule has progressed since 03/05/2013, when it measured 5 mm.      05/26/2014 Imaging    CT Chest- Continued enlargement of the superior segment left lower lobe nodule. Malignancy is likely.   In contrast, the right lower lobe nodule is probably benign.      08/17/2014 Imaging    CT- Superior segment left lower lobe pulmonary nodule measures stable since the most recent comparison study, but is again noted to have increased in size when comparing to older studies. Continued close attention will be required as neoplasm remains a con      10/26/2014 Surgery    thoracoscopic left lower lobe superior segmentectomy. Pathology with metastatic adenocarcinoma c/w colonic primary      01/04/2015 - 06/27/2015 Chemotherapy    FOLFOX+Avastin (Avastin  started on 01/18/2015).      01/09/2015 Imaging    CT CAP- Interval wedge resection of metastasis within the left lower lobe. No evidence of new metastatic disease within the chest, abdomen or pelvis.      02/15/2015 Treatment Plan Change    Treatment deferred due to renal function change (patient poor historian) with N/V.      02/22/2015 Treatment Plan Change    Added Aranesp at renal dosing to supportive therpay plan.      03/08/2015 Treatment Plan Change    Defer treatment x 1 week      04/12/2015 Adverse Reaction    Patient reported mouth sores.  None on exam.        04/12/2015 Treatment Plan Change    5FU bolus is discontinued for "mouth sores"      04/12/2015 Adverse Reaction    Increasing Creatinine Cl.  CrCl calculated to be 30.7.        04/12/2015 Treatment Plan Change    Oxaliplatin dose reduced by 20% to 65 mg/m2 based upon creatinine clearance.      05/24/2015 Treatment Plan Change    5FU CI decreased by 15%.      08/29/2015 Imaging    CT CAP- Status post APR with left lower quadrant colostomy, and some stable presacral scarring. No definite evidence of metastatic disease in the CAP.  There is a new 3 mm nodule in the right lower  lobe which is highly nonspecific. Attention on follow-up.      01/03/2016 Imaging    CT chest- No definite findings to suggest metastatic disease to the lungs. Previously noted 3 mm right lower lobe pulmonary nodule stable, favored to be benign.      08/22/2016 Imaging    CT CAP-1. Abdominoperineal resection with stable scarring and left lower quadrant colostomy. 2. 4 mm right lower lobe nodule, stable. 3. 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        HISTORY OF PRESENT ILLNESS:  (From Kirby Crigler, PA-C's last note on 02/26/16)      INTERVAL HISTORY:  Ms. Brossard 67 y.o. female returns for routine follow-up for metastatic rectal cancer.   Here today with caregiver from group home where  she resides [facility]).    Overall, she tells me she has been doing "pretty good." Appetite 100%; energy levels 75%.  Chart reviewed; she has lost ~5 lbs in the past 6 months. Her caregiver tells me that she eats well, but they have been trying to provide more healthy options for her and limiting carbs, etc.    Pt's only complaint today is chest congestion, which has been going on for ~2 weeks. Denies any associated fevers or chills.  Her caregiver tells me that she was seen by Dr. Luan Pulling and was given an antibiotic. Caregiver states that her symptoms initially improved, but thinks they may have worsened a bit since she finished antibiotics.  Pt denies any shortness of breath or sore throat. She has had some runny nose in the past couple of weeks.  Endorses occasional headaches, "but not too much."    Otherwise, she is largely without other complaints today.      REVIEW OF SYSTEMS:  Review of Systems  Constitutional: Positive for fatigue. Negative for chills and fever.  HENT:  Negative.  Negative for sore throat.   Eyes: Negative.   Respiratory: Positive for cough. Negative for shortness of breath.   Cardiovascular: Positive for leg swelling.  Gastrointestinal: Negative.  Negative for abdominal pain, blood in stool, constipation, diarrhea, nausea and vomiting.  Endocrine: Positive for hot flashes.  Genitourinary: Negative.    Musculoskeletal: Negative.   Skin: Negative.   Neurological: Positive for numbness.  Hematological: Negative.   Psychiatric/Behavioral: Negative.      PAST MEDICAL/SURGICAL HISTORY:  Past Medical History:  Diagnosis Date  . Arthritis   . Asthma   . Cancer (HCC)    Rectal   . Chronic renal disease, stage 3, moderately decreased glomerular filtration rate between 30-59 mL/min/1.73 square meter ([place]) 04/27/2014  . Depression   . Diabetes mellitus    years  . GERD (gastroesophageal reflux disease)   . Gout   . History of recurrent UTIs   .  Hypertension   . Iron deficiency 04/27/2014  . Mental retardation    stopped school at 9th grade   . Numbness and tingling in hands    x several months   . Rectal cancer ([place])   . Seizures ([place])    more than 4 yrs since last seizure. UNknown etiology  . Shortness of breath    with exertion  . Vitamin B 12 deficiency 02/01/2015   Incidentally found without antibody testing.     Past Surgical History:  Procedure Laterality Date  . ABDOMINAL HYSTERECTOMY    . ABDOMINAL PERINEAL BOWEL RESECTION N/A 07/26/2013   Procedure:  ABDOMINAL PERINEAL RESECTION;  Surgeon: Jamesetta So, MD;  Location: [facility];  Service: General;  Laterality: N/A;  . COLON SURGERY  2015   bowel resection/w colostomy  . COLONOSCOPY N/A 02/04/2013   Procedure: COLONOSCOPY;  Surgeon: Rogene Houston, MD;  [facility];  Service: Endoscopy;  Laterality: N/A;  225  . COLONOSCOPY N/A 05/12/2014   Procedure: COLONOSCOPY;  Surgeon: Rogene Houston, MD;  [facility];  Service: Endoscopy;  Laterality: N/A;  1030  . COLONOSCOPY WITH ESOPHAGOGASTRODUODENOSCOPY (EGD) N/A 01/14/2013   Procedure: COLONOSCOPY WITH ESOPHAGOGASTRODUODENOSCOPY (EGD);  Surgeon: Rogene Houston, MD;  [facility];  Service: Endoscopy;  Laterality: N/A;  250-moved to 315 Ann to notify pt  . COLOSTOMY Left 07/26/2013   Procedure: COLOSTOMY;  Surgeon: Jamesetta So, MD;  Location: [facility];  Service: General;  Laterality: Left;  . EUS N/A 02/18/2013   Procedure: LOWER ENDOSCOPIC ULTRASOUND (EUS);  Surgeon: Milus Banister, MD;  Location: [facility];  Service: Endoscopy;  Laterality: N/A;  . FLEXIBLE SIGMOIDOSCOPY N/A 07/26/2013   Procedure: FLEXIBLE SIGMOIDOSCOPY;  Surgeon: Jamesetta So, MD;  Location: [facility];  Service: General;  Laterality: N/A;  . LYMPH NODE DISSECTION Left 10/26/2014   Procedure: LYMPH NODE DISSECTION;  Surgeon: [name], MD;  Location: [facility];  Service: Thoracic;  Laterality: Left;  [name]  MULTIPLE EXTRACTIONS WITH ALVEOLOPLASTY N/A 11/23/2012   Procedure: MULTIPLE EXTRACION 1, 2, 4, 5, 6, 7, 8, 9, 10, 11, 12, 13, 14, 17, 18, 20, 23, 24, 25, 26, 28, 29, 32 WITH ALVEOLOPLASTY, REMOVE BILATERAL TORI;  Surgeon: Gae Bon, DDS;  Location: [facility];  Service: Oral Surgery;  Laterality: N/A;  . PORTACATH PLACEMENT Left 01/02/2015   Procedure: INSERTION PORT-A-CATH;  Surgeon: Aviva Signs, MD;  Location[facility];  Service: General;  Laterality: Left;  . SALPINGOOPHORECTOMY Bilateral 07/26/2013   Procedure: SALPINGO OOPHORECTOMY;  Surgeon: Jamesetta So, MD;  Location[facility];  Service: General;  Laterality: Bilateral;  . SEGMENTECOMY Left 10/26/2014   Procedure: LEFT LOWER LOBE SUPERIOR SEGMENTECTOMY;  Surgeon: [name], MD;  Location: [facility];  Service: Thoracic;  Laterality: Left;  . SUPRACERVICAL ABDOMINAL HYSTERECTOMY N/A 07/26/2013   Procedure: HYSTERECTOMY SUPRACERVICAL ABDOMINAL ;  Surgeon: Jamesetta So, MD;  [facility];  Service: General;  Laterality: N/A;  . VIDEO ASSISTED THORACOSCOPY Left 10/26/2014   Procedure: LEFT VIDEO ASSISTED THORACOSCOPY;  Surgeon: [name], MD;  Location: [facility];  Service: Thoracic;  Laterality: Left;     SOCIAL HISTORY:  Social History   Socioeconomic History  . Marital status: Single    Spouse name: Not on file  . Number of children: Not on file  . Years of education: Not on file  . Highest education level: Not on file  Social Needs  . Financial resource strain: Not on file  . Food insecurity - worry: Not on file  . Food insecurity - inability: Not on file  . Transportation needs - medical: Not on file  . Transportation needs - non-medical: Not on file  Occupational History  . Not on file  Tobacco Use  . Smoking status: Former Smoker    Types: Cigarettes    Last attempt to quit: 07/16/2008    Years since quitting: 8.7  . Smokeless tobacco: Never Used  . Tobacco comment: unknown when stopped smoking      long  time  Substance and Sexual Activity  . Alcohol use: No  . Drug use: No  . Sexual activity:  Not on file  Other Topics Concern  . Not on file  Social History Narrative  . Not on file    FAMILY HISTORY:  No family history on file.  CURRENT MEDICATIONS:  Outpatient Encounter Medications as of 04/23/2017  Medication Sig Note  . amLODipine (NORVASC) 10 MG tablet Take 1 tablet (10 mg total) by mouth every morning.   [name] aspirin EC 81 MG tablet Take 81 mg by mouth daily.   [name] azithromycin (ZITHROMAX) 250 MG tablet    . calcium carbonate (TUMS) 500 MG chewable tablet Chew 2 tablets (400 mg of elemental calcium total) by mouth 3 (three) times daily.   [name] docusate sodium (COLACE) 100 MG capsule Take 100 mg by mouth 2 (two) times daily.   . ferrous sulfate 325 (65 FE) MG tablet TAKE (1) TABLET BY MOUTH TWICE DAILY.   . fish oil-omega-3 fatty acids 1000 MG capsule Take 1 g by mouth 2 (two) times daily.   . Fluticasone-Salmeterol (ADVAIR) 250-50 MCG/DOSE AEPB Inhale 1 puff into the lungs every 12 (twelve) hours.   . furosemide (LASIX) 40 MG tablet Take 40 mg by mouth daily.   [name] glucose blood (ACCU-CHEK AVIVA PLUS) test strip 1 each by Other route daily. Use as instructed   . Insulin Glargine (LANTUS SOLOSTAR) 100 UNIT/ML SOPN Inject 12 Units into the skin at bedtime.  10/24/2014: .  . ipratropium-albuterol (DUONEB) 0.5-2.5 (3) MG/3ML SOLN  03/08/2015: Received from: External Pharmacy  . loratadine-pseudoephedrine (LORATADINE-D 24HR) 10-240 MG per 24 hr tablet Take 1 tablet by mouth daily.   . magnesium oxide (MAG-OX) 400 (241.3 Mg) MG tablet Take 1 tablet (400 mg total) by mouth daily.   . metFORMIN (GLUCOPHAGE) 1000 MG tablet Take 1,000 mg by mouth 2 (two) times daily with a meal.   . metoprolol succinate (TOPROL-XL) 50 MG 24 hr tablet Take 100 mg by mouth at bedtime.  10/24/2014: .  . montelukast (SINGULAIR) 10 MG tablet Take 10 mg by mouth at bedtime.   . mupirocin ointment (BACTROBAN) 2 %    .  NOVOFINE AUTOCOVER 30G X 8 MM MISC  05/24/2015: Received from: External Pharmacy  . OS-CAL CALCIUM + D3 500-200 MG-UNIT TABS TAKE 2 TABLETS BY MOUTH DAILY WITH BREAKFAST.   . pantoprazole (PROTONIX) 40 MG tablet Take 40 mg by mouth every morning.    . phenytoin (DILANTIN) 100 MG ER capsule Take 100 mg by mouth 2 (two) times daily. 02/03/2013: .  . potassium chloride SA (K-DUR,KLOR-CON) 20 MEQ tablet Take 20 mEq by mouth daily. Reported on 07/07/2015   . pravastatin (PRAVACHOL) 40 MG tablet Take 40 mg by mouth every evening.    [name] PROAIR HFA 108 (90 BASE) MCG/ACT inhaler  12/27/2014: Received from: External Pharmacy  . senna-docusate (SENOKOT-S) 8.6-50 MG tablet Take 1 tablet by mouth 2 (two) times daily as needed for mild constipation.   . sodium polystyrene (KAYEXALATE) powder Take by mouth once.   . traMADol (ULTRAM) 50 MG tablet Take 1 tablet (50 mg total) by mouth every 4 (four) hours as needed for moderate pain.   . traZODone (DESYREL) 50 MG tablet  02/15/2015: Received from: External Pharmacy  . triamcinolone cream (KENALOG) 0.1 % Apply 1 application topically 2 (two) times daily. To stoma    No facility-administered encounter medications on file as of 04/23/2017.     ALLERGIES:  No Known Allergies   PHYSICAL EXAM:  ECOG Performance status: 1-2 - Requires assistance given cognitive delay; lives at group home.  Vitals:   04/23/17 1139  BP: (!) 160/56  Pulse: 69  Resp: 16  SpO2: 97%   Filed Weights   04/23/17 1139  Weight: 250 lb (113.4 kg)    Physical Exam  Constitutional: She is oriented to person, place, and time.  Exam done with patient seated in wheelchair   HENT:  Head: Normocephalic.  Mouth/Throat: Oropharynx is clear and moist. No oropharyngeal exudate.  Eyes: Conjunctivae are normal. Pupils are equal, round, and reactive to light. No scleral icterus.  Neck: Normal range of motion. Neck supple.  Cardiovascular: Normal rate and regular rhythm.  Pulmonary/Chest:  Effort normal.  Diminished bilat bases, otherwise clear to auscultation bilat   Abdominal: Soft. Bowel sounds are normal. There is no tenderness. There is no rebound.  LLQ ostomy in place  Musculoskeletal: She exhibits edema (2+ BLE pitting edema; compression stockings in place ).  Lymphadenopathy:    She has no cervical adenopathy.  Neurological: She is alert and oriented to person, place, and time.  Cognitive delay (chronic and stable)   Skin: Skin is warm and dry. No rash noted.  Psychiatric: Mood and affect normal.  Nursing note and vitals reviewed.    LABORATORY DATA:  I have reviewed the labs as listed.  CBC    Component Value Date/Time   WBC 4.8 07/23/2016 1144   RBC 3.63 (L) 07/23/2016 1144   HGB 10.1 (L) 07/23/2016 1144   HCT 32.0 (L) 07/23/2016 1144   PLT 200 07/23/2016 1144   MCV 88.2 07/23/2016 1144   MCH 27.8 07/23/2016 1144   MCHC 31.6 07/23/2016 1144   RDW 14.4 07/23/2016 1144   LYMPHSABS 0.7 07/23/2016 1144   MONOABS 0.4 07/23/2016 1144   EOSABS 0.2 07/23/2016 1144   BASOSABS 0.0 07/23/2016 1144   CMP Latest Ref Rng & Units 04/17/2017 07/23/2016 05/29/2016  Glucose 65 - 99 mg/dL - 79 143(H)  BUN 6 - 20 mg/dL - 46(H) 47(H)  Creatinine 0.44 - 1.00 mg/dL 1.40(H) 1.42(H) 1.46(H)  Sodium 135 - 145 mmol/L - 139 134(L)  Potassium 3.5 - 5.1 mmol/L - 4.6 4.5  Chloride 101 - 111 mmol/L - 100(L) 97(L)  CO2 22 - 32 mmol/L - 29 29  Calcium 8.9 - 10.3 mg/dL - 9.0 8.8(L)  Total Protein 6.5 - 8.1 g/dL - 7.3 7.1  Total Bilirubin 0.3 - 1.2 mg/dL - 0.3 0.3  Alkaline Phos 38 - 126 U/L - 340(H) 297(H)  AST 15 - 41 U/L - 22 18  ALT 14 - 54 U/L - 22 19      PENDING LABS:    DIAGNOSTIC IMAGING:  *The following radiologic images and reports have been reviewed independently and agree with below findings.  CT chest/abd/pelvis: 04/17/17 CLINICAL DATA:  Patient with history of stage IV metastatic rectal carcinoma. Assess treatment response.  EXAM: CT CHEST, ABDOMEN, AND  PELVIS WITH CONTRAST  TECHNIQUE: Multidetector CT imaging of the chest, abdomen and pelvis was performed following the standard protocol during bolus administration of intravenous contrast.  CONTRAST:  126mL ISOVUE-300 IOPAMIDOL (ISOVUE-300) INJECTION 61%  COMPARISON:  CT CAP 08/22/2016.  FINDINGS: CT CHEST FINDINGS  Cardiovascular: Left anterior chest wall Port-A-Cath is present with tip terminating in the central left brachiocephalic vein, unchanged. Normal heart size. No pericardial effusion. Aorta and main pulmonary artery normal in caliber.  Mediastinum/Nodes: No enlarged axillary, mediastinal or hilar lymphadenopathy. Normal esophagus.  Lungs/Pleura: Central airways are patent. Patchy ground-glass and consolidative opacities within the medial lower lobes bilaterally. New 4 mm left  upper lobe nodule anteriorly (image 52; series 4). Stable 4 mm right lower lobe nodule (image 63; series 4). No pleural effusion or pneumothorax. Stable left lower lobe postsurgical changes.  Musculoskeletal: Re demonstrated trabecular thickening and coarsening of the thoracic spine, ribs and left scapula.  CT ABDOMEN PELVIS FINDINGS  Hepatobiliary: The liver is normal in size and contour. No focal hepatic lesion is identified. Gallbladder is unremarkable. No intrahepatic or extrahepatic biliary ductal dilatation.  Pancreas: Unremarkable  Spleen: Unremarkable  Adrenals/Urinary Tract: Re demonstrated right adrenal gland thickening. Kidneys enhance symmetrically with contrast. The left kidney is atrophic. Urinary bladder is unremarkable.  Stomach/Bowel: Postsurgical changes compatible with abdominoperineal resection. Similar-appearing soft tissue thickening/scarring within the presacral location. Re demonstrated left lower quadrant colostomy. No evidence for small bowel obstruction. Normal morphology of the stomach. No free fluid or free  intraperitoneal air.  Vascular/Lymphatic: Normal caliber abdominal aorta. No retroperitoneal lymphadenopathy.  Reproductive: Status post hysterectomy.  Other: None.  Musculoskeletal: Re demonstrated trabecular coarsening and thickening of the spine and pelvis.  IMPRESSION: 1. Stable postsurgical changes compatible with prior abdominoperineal resection and left lower quadrant colostomy. 2. New tiny ground-glass nodule anterior left upper lobe. Recommend attention on follow-up. Stable 4 mm right lower lobe nodule. 3. Presumed Paget's disease involving the scapular spine and pelvis.   Electronically Signed   By: Lovey Newcomer M.D.   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 Subsequent CT imaging revealed enlarging LLL pulmonary nodule. She underwent LLL resection with path confirming metastatic adenocarcinoma consistent with colon primary. Started chemotherapy with FOLFOX/Avastin in 12/2014. Chemotherapy course complicated by several treatment deferments for adverse side effects requiring dose reductions/discontinuations.   -CEA levels have mildly elevated in the past. Last CEA normal at 4.6 in 07/2016.  -Most recent CT chest/abd/pelvis from 04/17/17 reviewed with patient/caregiver; CT shows stable post-surgical changes from abdominoperineal resection; there is a new tiny (4 mm) ground-glass nodule to LUL; also stable nodule 4 mm in RLL.  -She does have colostomy; no GI complaints. Recent CT abd/pelvis imaging negative for recurrent or metastatic disease. Defer repeat colonoscopy surveillance per Dr. Laural Golden with GI.  -Return to cancer center in 4 months for  follow-up a few days after CT chest to review results with port flush/labs.   NCCN Guidelines reviewed:      New lung nodule:  -New ground-glass nodule in LUL, which is small (4 mm).  Also, has stable RLL nodule, also 4 mm.  Given her history of oligometastatic disease to the lung, will repeat CT chest without contrast in 4 months to ensure stability of new LUL lung nodule. Orders placed today.   Port-a-cath maintenance:  -Continue port flush every 2 months.   Low vitamin B12:  -Continue monthly B12 injections at cancer center.       Dispo:  -Continue port flush every 2 months. -CT chest without contrast in 4 months for new lung nodule.  -Return to cancer center in 4 months a few days after CT scan for follow-up with port flush/labs.    All questions were answered to patient's stated satisfaction. Encouraged patient to call with any new concerns or questions before her next visit  to the cancer center and we can certain see her sooner, if needed.      Orders placed this encounter:  Orders Placed This Encounter  Procedures  . CT Chest Wo Contrast  . CBC with Differential/Platelet  . CEA  . Comprehensive metabolic panel  . Vitamin B12      Mike Craze, NP [place] [phone]

## 2017-04-24 LAB — CEA: CEA: 4.1 ng/mL (ref 0.0–4.7)

## 2017-05-13 ENCOUNTER — Encounter (INDEPENDENT_AMBULATORY_CARE_PROVIDER_SITE_OTHER): Payer: Self-pay | Admitting: *Deleted

## 2017-05-21 ENCOUNTER — Other Ambulatory Visit: Payer: Self-pay

## 2017-05-21 ENCOUNTER — Encounter (HOSPITAL_COMMUNITY): Payer: Self-pay

## 2017-05-21 ENCOUNTER — Inpatient Hospital Stay (HOSPITAL_COMMUNITY): Payer: Medicare Other | Attending: Oncology

## 2017-05-21 VITALS — BP 151/58 | HR 72 | Temp 98.0°F | Resp 18

## 2017-05-21 DIAGNOSIS — E538 Deficiency of other specified B group vitamins: Secondary | ICD-10-CM | POA: Diagnosis present

## 2017-05-21 MED ORDER — CYANOCOBALAMIN 1000 MCG/ML IJ SOLN
INTRAMUSCULAR | Status: AC
  Filled 2017-05-21: qty 1

## 2017-05-21 MED ORDER — CYANOCOBALAMIN 1000 MCG/ML IJ SOLN
1000.0000 ug | Freq: Once | INTRAMUSCULAR | Status: AC
  Administered 2017-05-21: 1000 ug via INTRAMUSCULAR

## 2017-05-21 NOTE — Progress Notes (Signed)
[name] presents today for injection per the provider's orders.  B12 administration without incident; see MAR for injection details.  Patient tolerated procedure well and without incident.  No questions or complaints noted at this time. Discharged via wheelchair in c/o caregiver.

## 2017-06-09 ENCOUNTER — Encounter: Payer: Self-pay | Admitting: Podiatry

## 2017-06-09 ENCOUNTER — Ambulatory Visit (INDEPENDENT_AMBULATORY_CARE_PROVIDER_SITE_OTHER): Payer: Medicare Other | Admitting: Podiatry

## 2017-06-09 DIAGNOSIS — M79609 Pain in unspecified limb: Principal | ICD-10-CM

## 2017-06-09 DIAGNOSIS — M79676 Pain in unspecified toe(s): Secondary | ICD-10-CM | POA: Diagnosis not present

## 2017-06-09 DIAGNOSIS — B351 Tinea unguium: Secondary | ICD-10-CM

## 2017-06-09 DIAGNOSIS — E119 Type 2 diabetes mellitus without complications: Secondary | ICD-10-CM

## 2017-06-09 NOTE — Progress Notes (Signed)
Complaint:  Visit Type: Patient returns to my office for continued preventative foot care services. Complaint: Patient states" my nails have grown long and thick and become painful to walk and wear shoes" Patient has been diagnosed with DM with no foot complications. The patient presents for preventative foot care services. No changes to ROS  Podiatric Exam: Vascular: dorsalis pedis and posterior tibial pulses are palpable bilateral. Capillary return is immediate. Temperature gradient is WNL. Skin turgor WNL  Sensorium: Diminished  Semmes Weinstein monofilament test. Normal tactile sensation bilaterally. Nail Exam: Pt has thick disfigured discolored nails with subungual debris noted bilateral entire nail hallux through fifth toenails Ulcer Exam: There is no evidence of ulcer or pre-ulcerative changes or infection. Orthopedic Exam: Muscle tone and strength are WNL. No limitations in general ROM. No crepitus or effusions noted. Foot type and digits show no abnormalities. Bony prominences are unremarkable. Skin: No Porokeratosis. No infection or ulcers  Diagnosis:  Onychomycosis, , Pain in right toe, pain in left toes  Treatment & Plan Procedures and Treatment: Consent by patient was obtained for treatment procedures.   Debridement of mycotic and hypertrophic toenails, 1 through 5 bilateral and clearing of subungual debris. No ulceration, no infection noted.  Return Visit-Office Procedure: Patient instructed to return to the office for a follow up visit 3 months for continued evaluation and treatment.    Heavan Francom DPM 

## 2017-06-20 ENCOUNTER — Other Ambulatory Visit (HOSPITAL_COMMUNITY): Payer: Medicare Other

## 2017-07-21 ENCOUNTER — Ambulatory Visit (HOSPITAL_COMMUNITY): Payer: Medicare Other

## 2017-07-24 ENCOUNTER — Other Ambulatory Visit (HOSPITAL_COMMUNITY): Payer: Self-pay | Admitting: Pulmonary Disease

## 2017-07-24 ENCOUNTER — Ambulatory Visit (HOSPITAL_COMMUNITY)
Admission: RE | Admit: 2017-07-24 | Discharge: 2017-07-24 | Disposition: A | Discharge status: Home / Self Care | Payer: Medicare Other | Source: Ambulatory Visit | Attending: Pulmonary Disease | Admitting: Pulmonary Disease

## 2017-07-24 DIAGNOSIS — R05 Cough: Secondary | ICD-10-CM | POA: Insufficient documentation

## 2017-07-24 DIAGNOSIS — R059 Cough, unspecified: Secondary | ICD-10-CM

## 2017-07-29 ENCOUNTER — Encounter (HOSPITAL_COMMUNITY): Payer: Self-pay

## 2017-07-29 ENCOUNTER — Inpatient Hospital Stay (HOSPITAL_COMMUNITY): Payer: Medicare Other | Attending: Oncology

## 2017-07-29 ENCOUNTER — Other Ambulatory Visit (HOSPITAL_COMMUNITY): Payer: Self-pay | Admitting: Oncology

## 2017-07-29 VITALS — BP 95/65 | HR 70 | Temp 98.8°F | Resp 18

## 2017-07-29 DIAGNOSIS — R918 Other nonspecific abnormal finding of lung field: Secondary | ICD-10-CM

## 2017-07-29 DIAGNOSIS — C7800 Secondary malignant neoplasm of unspecified lung: Secondary | ICD-10-CM

## 2017-07-29 DIAGNOSIS — C20 Malignant neoplasm of rectum: Secondary | ICD-10-CM | POA: Diagnosis not present

## 2017-07-29 DIAGNOSIS — E538 Deficiency of other specified B group vitamins: Secondary | ICD-10-CM | POA: Diagnosis present

## 2017-07-29 DIAGNOSIS — C7801 Secondary malignant neoplasm of right lung: Secondary | ICD-10-CM | POA: Insufficient documentation

## 2017-07-29 LAB — CBC WITH DIFFERENTIAL/PLATELET
BASOS ABS: 0 10*3/uL (ref 0.0–0.1)
BASOS PCT: 0 %
EOS ABS: 0.4 10*3/uL (ref 0.0–0.7)
Eosinophils Relative: 6 %
HCT: 31.8 % — ABNORMAL LOW (ref 36.0–46.0)
Hemoglobin: 9.9 g/dL — ABNORMAL LOW (ref 12.0–15.0)
Lymphocytes Relative: 15 %
Lymphs Abs: 1.1 10*3/uL (ref 0.7–4.0)
MCH: 27.6 pg (ref 26.0–34.0)
MCHC: 31.1 g/dL (ref 30.0–36.0)
MCV: 88.6 fL (ref 78.0–100.0)
MONO ABS: 0.5 10*3/uL (ref 0.1–1.0)
Monocytes Relative: 6 %
NEUTROS PCT: 73 %
Neutro Abs: 5.3 10*3/uL (ref 1.7–7.7)
PLATELETS: 171 10*3/uL (ref 150–400)
RBC: 3.59 MIL/uL — ABNORMAL LOW (ref 3.87–5.11)
RDW: 14.2 % (ref 11.5–15.5)
WBC: 7.3 10*3/uL (ref 4.0–10.5)

## 2017-07-29 LAB — COMPREHENSIVE METABOLIC PANEL
ALT: 23 U/L (ref 14–54)
AST: 17 U/L (ref 15–41)
Albumin: 3.7 g/dL (ref 3.5–5.0)
Alkaline Phosphatase: 373 U/L — ABNORMAL HIGH (ref 38–126)
Anion gap: 11 (ref 5–15)
BUN: 55 mg/dL — ABNORMAL HIGH (ref 6–20)
CHLORIDE: 96 mmol/L — AB (ref 101–111)
CO2: 28 mmol/L (ref 22–32)
Calcium: 8.4 mg/dL — ABNORMAL LOW (ref 8.9–10.3)
Creatinine, Ser: 1.61 mg/dL — ABNORMAL HIGH (ref 0.44–1.00)
GFR calc non Af Amer: 32 mL/min — ABNORMAL LOW (ref >60)
GFR, EST AFRICAN AMERICAN: 37 mL/min — AB (ref >60)
Glucose, Bld: 82 mg/dL (ref 65–99)
POTASSIUM: 4.1 mmol/L (ref 3.5–5.1)
SODIUM: 135 mmol/L (ref 135–145)
Total Bilirubin: 0.4 mg/dL (ref 0.3–1.2)
Total Protein: 7.2 g/dL (ref 6.5–8.1)

## 2017-07-29 LAB — VITAMIN B12: VITAMIN B 12: 423 pg/mL (ref 180–914)

## 2017-07-29 MED ORDER — CYANOCOBALAMIN 1000 MCG/ML IJ SOLN
1000.0000 ug | Freq: Once | INTRAMUSCULAR | Status: AC
Start: 2017-07-29 — End: 2017-07-29
  Administered 2017-07-29: 1000 ug via INTRAMUSCULAR
  Filled 2017-07-29: qty 1

## 2017-07-29 MED ORDER — HEPARIN SOD (PORK) LOCK FLUSH 100 UNIT/ML IV SOLN
500.0000 [IU] | Freq: Once | INTRAVENOUS | Status: AC
  Administered 2017-07-29: 500 [IU] via INTRAVENOUS
  Filled 2017-07-29: qty 5

## 2017-07-29 MED ORDER — SODIUM CHLORIDE 0.9% FLUSH
10.0000 mL | Freq: Once | INTRAVENOUS | Status: AC
  Administered 2017-07-29: 10 mL via INTRAVENOUS

## 2017-07-29 NOTE — Patient Instructions (Signed)
[place] at [facility]  Discharge Instructions:  You received a b12 shot today and your port was flushed today.  _______________________________________________________________  Thank you for choosing [facility] to provide your oncology and hematology care.  To afford each patient quality time with our providers, please arrive at least 15 minutes before your scheduled appointment.  You need to re-schedule your appointment if you arrive 10 or more minutes late.  We strive to give you quality time with our providers, and arriving late affects you and other patients whose appointments are after yours.  Also, if you no show three or more times for appointments you may be dismissed from the clinic.  Again, thank you for choosing [facility] hope is that these requests will allow you access to exceptional care and in a timely manner. _______________________________________________________________  If you have questions after your visit, please contact our office at [phone] between the hours of 8:30 a.m. and 5:00 p.m. Voicemails left after 4:30 p.m. will not be returned until the following business day. _______________________________________________________________  For prescription refill requests, have your pharmacy contact our office. _______________________________________________________________  Recommendations made by the consultant and any test results will be sent to your referring physician. _______________________________________________________________

## 2017-07-29 NOTE — Progress Notes (Signed)
Patient tolerated port flush and vitamin b12 shot with no complaints.  Port site clean and dry with no bruising or swelling noted at site.  Good blood return noted.  Band aid applied.  Vitamin b12 site clean and dry with no bruising or swelling noted at site.  Band aid applied.  VSS with discharge and left via wheelchair with caregiver.  No s/s of distress noted.

## 2017-07-30 LAB — CEA: CEA1: 3.9 ng/mL (ref 0.0–4.7)

## 2017-08-18 ENCOUNTER — Ambulatory Visit (HOSPITAL_COMMUNITY): Admission: RE | Admit: 2017-08-18 | Payer: Medicare Other | Source: Ambulatory Visit

## 2017-08-21 ENCOUNTER — Encounter (HOSPITAL_COMMUNITY): Payer: Medicare Other

## 2017-08-21 ENCOUNTER — Ambulatory Visit (HOSPITAL_COMMUNITY): Payer: Medicare Other | Admitting: Hematology

## 2017-08-26 ENCOUNTER — Ambulatory Visit (HOSPITAL_COMMUNITY): Payer: Medicare Other | Admitting: Internal Medicine

## 2017-08-26 ENCOUNTER — Encounter (HOSPITAL_COMMUNITY): Payer: Self-pay

## 2017-08-26 ENCOUNTER — Inpatient Hospital Stay (HOSPITAL_COMMUNITY): Payer: Medicare Other | Attending: Oncology

## 2017-08-26 ENCOUNTER — Encounter (HOSPITAL_COMMUNITY): Payer: Medicare Other

## 2017-08-26 ENCOUNTER — Other Ambulatory Visit (HOSPITAL_COMMUNITY): Payer: Self-pay | Admitting: Oncology

## 2017-08-26 VITALS — BP 148/70 | HR 81 | Temp 98.7°F | Resp 18

## 2017-08-26 DIAGNOSIS — E538 Deficiency of other specified B group vitamins: Secondary | ICD-10-CM | POA: Diagnosis present

## 2017-08-26 MED ORDER — CYANOCOBALAMIN 1000 MCG/ML IJ SOLN
INTRAMUSCULAR | Status: AC
  Filled 2017-08-26: qty 1

## 2017-08-26 MED ORDER — CYANOCOBALAMIN 1000 MCG/ML IJ SOLN
1000.0000 ug | Freq: Once | INTRAMUSCULAR | Status: AC
  Administered 2017-08-26: 1000 ug via INTRAMUSCULAR

## 2017-08-26 NOTE — Patient Instructions (Signed)
[facility] at [facility]  Discharge Instructions:  You received a b12 shot today.  _______________________________________________________________  Thank you for choosing [facility] at [facility] to provide your oncology and hematology care.  To afford each patient quality time with our providers, please arrive at least 15 minutes before your scheduled appointment.  You need to re-schedule your appointment if you arrive 10 or more minutes late.  We strive to give you quality time with our providers, and arriving late affects you and other patients whose appointments are after yours.  Also, if you no show three or more times for appointments you may be dismissed from the clinic.  Again, thank you for choosing [facility] at [facility]. Our hope is that these requests will allow you access to exceptional care and in a timely manner. _______________________________________________________________  If you have questions after your visit, please contact our office at [phone] between the hours of 8:30 a.m. and 5:00 p.m. Voicemails left after 4:30 p.m. will not be returned until the following business day. _______________________________________________________________  For prescription refill requests, have your pharmacy contact our office. _______________________________________________________________  Recommendations made by the consultant and any test results will be sent to your referring physician. _______________________________________________________________ 

## 2017-08-26 NOTE — Progress Notes (Signed)
Patient tolerated b12 injection with no complaints voiced.  Site clean and dry with no bruising or swelling noted at site.  Band aid applied.  VSs with discharge and left with caregiver with no s/s of distress noted.

## 2017-09-09 ENCOUNTER — Ambulatory Visit (HOSPITAL_COMMUNITY): Admission: RE | Admit: 2017-09-09 | Payer: Medicare Other | Source: Ambulatory Visit

## 2017-09-22 ENCOUNTER — Encounter (HOSPITAL_COMMUNITY): Payer: Medicare Other

## 2017-09-23 ENCOUNTER — Encounter (HOSPITAL_COMMUNITY): Payer: Self-pay | Admitting: Internal Medicine

## 2017-09-23 ENCOUNTER — Inpatient Hospital Stay (HOSPITAL_COMMUNITY): Payer: Medicare Other | Admitting: Internal Medicine

## 2017-09-23 NOTE — Patient Instructions (Signed)
[facility] at [facility] Discharge Instructions  You saw Dr. Higgs today.   Thank you for choosing  [facility] at [facility] to provide your oncology and hematology care.  To afford each patient quality time with our provider, please arrive at least 15 minutes before your scheduled appointment time.   If you have a lab appointment with the Cancer Center please come in thru the  Main Entrance and check in at the main information desk  You need to re-schedule your appointment should you arrive 10 or more minutes late.  We strive to give you quality time with our providers, and arriving late affects you and other patients whose appointments are after yours.  Also, if you no show three or more times for appointments you may be dismissed from the clinic at the providers discretion.     Again, thank you for choosing [facility].  Our hope is that these requests will decrease the amount of time that you wait before being seen by our physicians.       _____________________________________________________________  Should you have questions after your visit to [facility], please contact our office at [phone] between the hours of 8:30 a.m. and 4:30 p.m.  Voicemails left after 4:30 p.m. will not be returned until the following business day.  For prescription refill requests, have your pharmacy contact our office.       Resources For Cancer Patients and their Caregivers ? [employer] Society: Can assist with transportation, wigs, general needs, runs Look Good Feel Better.        1-888-227-6333 ? Cancer Care: Provides financial assistance, online support groups, medication/co-pay assistance.  1-800-813-HOPE (4673) ? [facility] Assists [place] cancer patients and their families through emotional , educational and financial support.  336-427-4357 ? [place] DSS Where to apply for food  stamps, Medicaid and utility assistance. 336-342-1394 ? [facility]: Transportation to medical appointments. 336-347-2287 ? Social Security Administration: May apply for disability if have a Stage IV cancer. 336-342-7796 1-800-772-1213 ? [place] [facility]ging, Disability and Transit Services: Assists with nutrition, care and transit needs. 336-349-2343  Cancer Center Support Programs:   > Cancer Support Group  2nd Tuesday of the month 1pm-2pm, Journey Room   > Creative Journey  3rd Tuesday of the month 1130am-1pm, Journey Room     

## 2017-10-01 ENCOUNTER — Ambulatory Visit (INDEPENDENT_AMBULATORY_CARE_PROVIDER_SITE_OTHER): Payer: Medicare Other | Admitting: Podiatry

## 2017-10-01 ENCOUNTER — Encounter: Payer: Self-pay | Admitting: Podiatry

## 2017-10-01 DIAGNOSIS — E119 Type 2 diabetes mellitus without complications: Secondary | ICD-10-CM | POA: Diagnosis not present

## 2017-10-01 DIAGNOSIS — B351 Tinea unguium: Secondary | ICD-10-CM

## 2017-10-01 DIAGNOSIS — M79609 Pain in unspecified limb: Secondary | ICD-10-CM

## 2017-10-01 NOTE — Progress Notes (Addendum)
Complaint:  Visit Type: Patient returns to my office for continued preventative foot care services. Complaint: Patient states" my nails have grown long and thick and become painful to walk and wear shoes" Patient has been diagnosed with DM with no foot complications. The patient presents for preventative foot care services. No changes to ROS  Podiatric Exam: Vascular: dorsalis pedis and posterior tibial pulses are palpable bilateral. Capillary return is immediate. Temperature gradient is WNL. Skin turgor WNL  Sensorium: Diminished Semmes Weinstein monofilament test. Normal tactile sensation bilaterally. Nail Exam: Pt has thick disfigured discolored nails with subungual debris noted bilateral entire nail hallux through fifth toenails Ulcer Exam: There is no evidence of ulcer or pre-ulcerative changes or infection. Orthopedic Exam: Muscle tone and strength are WNL. No limitations in general ROM. No crepitus or effusions noted. Foot type and digits show no abnormalities. Bony prominences are unremarkable. Skin: No Porokeratosis. No infection or ulcers.  Masceration 3,4 interspaces  B/L  Diagnosis:  Onychomycosis, , Pain in right toe, pain in left toes  Treatment & Plan Procedures and Treatment: Consent by patient was obtained for treatment procedures.   Debridement of mycotic and hypertrophic toenails, 1 through 5 bilateral and clearing of subungual debris. No ulceration, no infection noted.  Return Visit-Office Procedure: Patient instructed to return to the office for a follow up visit 3 months for continued evaluation and treatment.    Gardiner Barefoot DPM

## 2017-10-08 ENCOUNTER — Other Ambulatory Visit (HOSPITAL_COMMUNITY): Payer: Self-pay | Admitting: *Deleted

## 2017-10-08 DIAGNOSIS — C20 Malignant neoplasm of rectum: Secondary | ICD-10-CM

## 2017-10-08 DIAGNOSIS — C7800 Secondary malignant neoplasm of unspecified lung: Principal | ICD-10-CM

## 2017-10-09 ENCOUNTER — Inpatient Hospital Stay (HOSPITAL_COMMUNITY): Payer: Medicare Other | Attending: Hematology

## 2017-10-09 ENCOUNTER — Ambulatory Visit (HOSPITAL_COMMUNITY)
Admission: RE | Admit: 2017-10-09 | Discharge: 2017-10-09 | Disposition: A | Discharge status: Home / Self Care | Payer: Medicare Other | Source: Ambulatory Visit | Attending: Adult Health | Admitting: Adult Health

## 2017-10-09 DIAGNOSIS — M889 Osteitis deformans of unspecified bone: Secondary | ICD-10-CM | POA: Insufficient documentation

## 2017-10-09 DIAGNOSIS — C20 Malignant neoplasm of rectum: Secondary | ICD-10-CM

## 2017-10-09 DIAGNOSIS — C7800 Secondary malignant neoplasm of unspecified lung: Secondary | ICD-10-CM

## 2017-10-09 DIAGNOSIS — C7801 Secondary malignant neoplasm of right lung: Secondary | ICD-10-CM | POA: Diagnosis not present

## 2017-10-09 DIAGNOSIS — R918 Other nonspecific abnormal finding of lung field: Secondary | ICD-10-CM | POA: Insufficient documentation

## 2017-10-09 LAB — COMPREHENSIVE METABOLIC PANEL
ALK PHOS: 382 U/L — AB (ref 38–126)
ALT: 17 U/L (ref 0–44)
AST: 20 U/L (ref 15–41)
Albumin: 3.7 g/dL (ref 3.5–5.0)
Anion gap: 9 (ref 5–15)
BILIRUBIN TOTAL: 0.2 mg/dL — AB (ref 0.3–1.2)
BUN: 45 mg/dL — AB (ref 8–23)
CO2: 28 mmol/L (ref 22–32)
CREATININE: 1.37 mg/dL — AB (ref 0.44–1.00)
Calcium: 8.5 mg/dL — ABNORMAL LOW (ref 8.9–10.3)
Chloride: 99 mmol/L (ref 98–111)
GFR calc Af Amer: 45 mL/min — ABNORMAL LOW (ref >60)
GFR calc non Af Amer: 39 mL/min — ABNORMAL LOW (ref >60)
GLUCOSE: 105 mg/dL — AB (ref 70–99)
Potassium: 4.6 mmol/L (ref 3.5–5.1)
Sodium: 136 mmol/L (ref 135–145)
TOTAL PROTEIN: 7.2 g/dL (ref 6.5–8.1)

## 2017-10-09 LAB — CBC WITH DIFFERENTIAL/PLATELET
BASOS ABS: 0 10*3/uL (ref 0.0–0.1)
BASOS PCT: 0 %
Eosinophils Absolute: 0.3 10*3/uL (ref 0.0–0.7)
Eosinophils Relative: 6 %
HEMATOCRIT: 32.3 % — AB (ref 36.0–46.0)
Hemoglobin: 10.2 g/dL — ABNORMAL LOW (ref 12.0–15.0)
LYMPHS PCT: 15 %
Lymphs Abs: 0.9 10*3/uL (ref 0.7–4.0)
MCH: 27.9 pg (ref 26.0–34.0)
MCHC: 31.6 g/dL (ref 30.0–36.0)
MCV: 88.5 fL (ref 78.0–100.0)
MONO ABS: 0.6 10*3/uL (ref 0.1–1.0)
Monocytes Relative: 10 %
NEUTROS ABS: 4.2 10*3/uL (ref 1.7–7.7)
Neutrophils Relative %: 69 %
Platelets: 185 10*3/uL (ref 150–400)
RBC: 3.65 MIL/uL — AB (ref 3.87–5.11)
RDW: 14 % (ref 11.5–15.5)
WBC: 6 10*3/uL (ref 4.0–10.5)

## 2017-10-10 LAB — CEA: CEA1: 4.2 ng/mL (ref 0.0–4.7)

## 2017-10-14 ENCOUNTER — Encounter (HOSPITAL_COMMUNITY): Payer: Medicare Other

## 2017-10-14 ENCOUNTER — Ambulatory Visit (HOSPITAL_COMMUNITY): Payer: Medicare Other | Admitting: Internal Medicine

## 2017-10-17 ENCOUNTER — Inpatient Hospital Stay (HOSPITAL_COMMUNITY): Payer: Medicare Other

## 2017-10-17 ENCOUNTER — Encounter (HOSPITAL_COMMUNITY): Payer: Self-pay | Admitting: Internal Medicine

## 2017-10-17 ENCOUNTER — Other Ambulatory Visit: Payer: Self-pay

## 2017-10-17 ENCOUNTER — Inpatient Hospital Stay (HOSPITAL_COMMUNITY): Payer: Medicare Other | Attending: Oncology | Admitting: Internal Medicine

## 2017-10-17 VITALS — BP 137/66 | HR 81 | Temp 98.7°F | Resp 20 | Wt 256.1 lb

## 2017-10-17 DIAGNOSIS — C7800 Secondary malignant neoplasm of unspecified lung: Secondary | ICD-10-CM

## 2017-10-17 DIAGNOSIS — R911 Solitary pulmonary nodule: Secondary | ICD-10-CM | POA: Diagnosis not present

## 2017-10-17 DIAGNOSIS — N189 Chronic kidney disease, unspecified: Secondary | ICD-10-CM

## 2017-10-17 DIAGNOSIS — R918 Other nonspecific abnormal finding of lung field: Secondary | ICD-10-CM

## 2017-10-17 DIAGNOSIS — Z933 Colostomy status: Secondary | ICD-10-CM | POA: Diagnosis not present

## 2017-10-17 DIAGNOSIS — E538 Deficiency of other specified B group vitamins: Secondary | ICD-10-CM

## 2017-10-17 DIAGNOSIS — C7801 Secondary malignant neoplasm of right lung: Secondary | ICD-10-CM | POA: Insufficient documentation

## 2017-10-17 DIAGNOSIS — C20 Malignant neoplasm of rectum: Secondary | ICD-10-CM | POA: Diagnosis not present

## 2017-10-17 MED ORDER — HEPARIN SOD (PORK) LOCK FLUSH 100 UNIT/ML IV SOLN
500.0000 [IU] | Freq: Once | INTRAVENOUS | Status: AC
  Administered 2017-10-17: 500 [IU] via INTRAVENOUS

## 2017-10-17 MED ORDER — CYANOCOBALAMIN 1000 MCG/ML IJ SOLN
1000.0000 ug | Freq: Once | INTRAMUSCULAR | Status: AC
  Administered 2017-10-17: 1000 ug via INTRAMUSCULAR

## 2017-10-17 MED ORDER — CYANOCOBALAMIN 1000 MCG/ML IJ SOLN
INTRAMUSCULAR | Status: AC
  Filled 2017-10-17: qty 1

## 2017-10-17 MED ORDER — SODIUM CHLORIDE 0.9% FLUSH
10.0000 mL | INTRAVENOUS | Status: DC | PRN
  Administered 2017-10-17: 10 mL via INTRAVENOUS
  Filled 2017-10-17: qty 10

## 2017-10-17 NOTE — Patient Instructions (Signed)
[place] at [facility] Discharge Instructions  Received Vit B12 injection and portacath flush today. Follow-up as scheduled. Call clinic for any questions or concerns   Thank you for choosing [place] at [facility] to provide your oncology and hematology care.  To afford each patient quality time with our provider, please arrive at least 15 minutes before your scheduled appointment time.   If you have a lab appointment with the [facility] please come in thru the  Main Entrance and check in at the main information desk  You need to re-schedule your appointment should you arrive 10 or more minutes late.  We strive to give you quality time with our providers, and arriving late affects you and other patients whose appointments are after yours.  Also, if you no show three or more times for appointments you may be dismissed from the clinic at the providers discretion.     Again, thank you for choosing [facility].  Our hope is that these requests will decrease the amount of time that you wait before being seen by our physicians.       _____________________________________________________________  Should you have questions after your visit to [facility] please contact our office at [phone] between the hours of 8:00 a.m. and 4:30 p.m.  Voicemails left after 4:00 p.m. will not be returned until the following business day.  For prescription refill requests, have your pharmacy contact our office and allow 72 hours.    Cancer Center Support Programs:   > Cancer Support Group  2nd Tuesday of the month 1pm-2pm, Journey Room

## 2017-10-17 NOTE — Progress Notes (Signed)
Diagnosis Rectal cancer metastasized to lung ([place]) - Plan: CBC with Differential/Platelet, Comprehensive metabolic panel, Lactate dehydrogenase, Ferritin, CEA  Abnormal findings on diagnostic imaging of lung - Plan: CT CHEST W CONTRAST, CT Abdomen Pelvis W Contrast, CBC with Differential/Platelet, Comprehensive metabolic panel, Lactate dehydrogenase, Ferritin, CEA  Staging Cancer Staging Rectal cancer metastasized to lung [facility]) Staging form: Colon and Rectum, AJCC 7th Edition - Clinical stage from 02/04/2013: Stage IIA (T3, N0, M0) - Signed by Baird Cancer, PA-C on 01/09/2015 - Clinical stage from 10/26/2014: Stage IVA (T3, N0, M1a) - Signed by Baird Cancer, PA-C on 01/09/2015   Assessment and Plan:  1.  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  Pt was initially diagnosed in 02/2013; underwent pelvic radiation with Xeloda chemotherapy, followed by surgical resection with APR, followed by 6 months of adjuvant Xeloda therapy. Subsequent CT imaging revealed enlarging LLL pulmonary nodule. She underwent LLL resection with path confirming metastatic adenocarcinoma consistent with colon primary. Started chemotherapy with FOLFOX/Avastin in 12/2014. Chemotherapy course complicated by several treatment deferments for adverse side effects requiring dose reductions/discontinuations.    Pt is seen today for follow-up to go over scans and labs.  Labs done 10/09/2017 reviewed with patient and shows a white count of 6 hemoglobin 10.2 platelets 185,000.  Chemistries are within normal limits.  She has a potassium of 4.6 creatinine 1.37 liver function tests are normal.  CEA is 4.2.  CT of chest done 10/09/2017 reviewed and shows  IMPRESSION: 1. Multifactorial degradation, including motion and patient body habitus. 2. Primarily similar pulmonary nodules. An anterior left upper lobe nodule described on the prior exam is no longer identified. 3. Paget's disease.  Patient  has remained on observation.  She is approaching 5 years out from diagnosis.  She will be set up for repeat imaging with CT CAP  in January 2020 and will follow-up at that time to go over scans and labs.  Patient has colostomy, so she should follow-up with Dr. Melony Overly of GI as directed.  2.  B12 deficiency.  She will continue monthly B12.    3.  Pulmonary nodules.  Recent CT of the chest done 10/09/2017 shows   IMPRESSION: Primarily similar pulmonary nodules. An anterior left upper lobe nodule described on the prior exam is no longer identified.  Patient has remained on observation.  She will be set up for repeat imaging with CT CAP in 03/2018 and will follow-up at that time for results.    4.  CKD.  Cr is 1.37.  Follow-up with PCP as directed.    5.  Health maintenance.  She should follow-up with GI as directed.  Follow-up with PCP and mammogram screenings as directed.  Interval History:  Historical data obtained from the note dated 04/23/2017.  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  Pt was initially diagnosed in 02/2013; underwent pelvic radiation with Xeloda chemotherapy, followed by surgical resection with APR, followed by 6 months of adjuvant Xeloda therapy. Subsequent CT imaging revealed enlarging LLL pulmonary nodule. She underwent LLL resection with path confirming metastatic adenocarcinoma consistent with colon primary. Started chemotherapy with FOLFOX/Avastin in 12/2014. Chemotherapy course complicated by several treatment deferments for adverse side effects requiring dose reductions/discontinuations.    Current Status: Caretaker present.  Patient is here for follow-up to go over labs and scans.    Rectal cancer metastasized to lung [facility])   02/18/2013 Initial Diagnosis    Rectal cancer  03/25/2013 - 05/05/2013 Radiation Therapy    Pelvis treatment from 1/8- 2/12 with rectal boost from 2/13- 05/05/2013.      03/25/2013 - 05/05/2013 Chemotherapy     Xeloda 1500 mg BID 5 days/week with radiation therapy.      07/26/2013 Definitive Surgery    Dr. Arnoldo Morale- Flexible sigmoidoscopy, abdominoperineal resection, total abdominal hysterectomy with bilateral salpingo-oophorectomy      08/30/2013 - 03/01/2014 Chemotherapy    Xeloda 1000 mg BID 14 days on and 7 days off x 6 months      03/08/2014 Imaging    CT CAP- Bilateral pulmonary nodules, including an 8 mm left lower lobe nodule. Metastatic disease is a concern. The left lower lobe nodule has progressed since 03/05/2013, when it measured 5 mm.      05/26/2014 Imaging    CT Chest- Continued enlargement of the superior segment left lower lobe nodule. Malignancy is likely.   In contrast, the right lower lobe nodule is probably benign.      08/17/2014 Imaging    CT- Superior segment left lower lobe pulmonary nodule measures stable since the most recent comparison study, but is again noted to have increased in size when comparing to older studies. Continued close attention will be required as neoplasm remains a con      10/26/2014 Surgery    thoracoscopic left lower lobe superior segmentectomy. Pathology with metastatic adenocarcinoma c/w colonic primary      01/04/2015 - 06/27/2015 Chemotherapy    FOLFOX+Avastin (Avastin started on 01/18/2015).      01/09/2015 Imaging    CT CAP- Interval wedge resection of metastasis within the left lower lobe. No evidence of new metastatic disease within the chest, abdomen or pelvis.      02/15/2015 Treatment Plan Change    Treatment deferred due to renal function change (patient poor historian) with N/V.      02/22/2015 Treatment Plan Change    Added Aranesp at renal dosing to supportive therpay plan.      03/08/2015 Treatment Plan Change    Defer treatment x 1 week      04/12/2015 Adverse Reaction    Patient reported mouth sores.  None on exam.        04/12/2015 Treatment Plan Change    5FU bolus is discontinued for "mouth sores"       04/12/2015 Adverse Reaction    Increasing Creatinine Cl.  CrCl calculated to be 30.7.        04/12/2015 Treatment Plan Change    Oxaliplatin dose reduced by 20% to 65 mg/m2 based upon creatinine clearance.      05/24/2015 Treatment Plan Change    5FU CI decreased by 15%.      08/29/2015 Imaging    CT CAP- Status post APR with left lower quadrant colostomy, and some stable presacral scarring. No definite evidence of metastatic disease in the CAP.  There is a new 3 mm nodule in the right lower lobe which is highly nonspecific. Attention on follow-up.      01/03/2016 Imaging    CT chest- No definite findings to suggest metastatic disease to the lungs. Previously noted 3 mm right lower lobe pulmonary nodule stable, favored to be benign.      08/22/2016 Imaging    CT CAP-1. Abdominoperineal resection with stable scarring and left lower quadrant colostomy. 2. 4 mm right lower lobe nodule, stable. 3. 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        Problem List  Patient Active Problem List   Diagnosis Date Noted  . Vitamin B 12 deficiency [E53.8] 02/01/2015  . Lung nodule [R91.1] 10/26/2014  . Iron deficiency [E61.1] 04/27/2014  . Chronic renal disease, stage 3, moderately decreased glomerular filtration rate between 30-59 mL/min/1.73 square meter ([place]) [N18.3] 04/27/2014  . Incidental pulmonary nodule, greater than or equal to 72m [R91.1] 04/26/2014  . Abdominal pain [R10.9] 04/26/2014  . Insomnia [G47.00] 04/26/2014  . [place] [C20, C78.00] 02/18/2013  . Helicobacter pylori gastritis [K29.70, B96.81] 02/08/2013  . Elevated liver enzymes [R74.8] 12/02/2012  . Anemia [D64.9] 08/29/2008  . ASTHMA, WITH ACUTE EXACERBATION [J45.901] 07/01/2008  . FLANK PAIN, LEFT [R10.9] 05/27/2008  . SINUS TACHYCARDIA [I49.8] 03/02/2008  . INTERTRIGO, CANDIDAL [L53.8] 10/19/2007  . DIABETES MELLITUS, TYPE II, UNCONTROLLED [E11.65]  07/27/2007  . LIVER FUNCTION TESTS, ABNORMAL [R94.5] 04/28/2007  . UNSPECIFIED SLEEP DISTURBANCE [G47.9] 02/03/2007  . 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 HYPERLIPIDEMIA [E78.5] 11/03/2006  . GOUT NOS [M10.9] 11/03/2006  . DEPRESSION [F32.9] 11/03/2006  . RETARDATION, MENTAL NOS [F79] 11/03/2006  . Essential hypertension [I10] 11/03/2006  . ALLERGIC RHINITIS [J30.9] 11/03/2006  . ASTHMA [J45.909] 11/03/2006  . ARTHRITIS [M12.9] 11/03/2006  . SEIZURE DISORDER [R56.9] 11/03/2006  . MIGRAINES, HX OF [Z87.898] 11/03/2006    Past Medical History Past Medical History:  Diagnosis Date  . Arthritis   . Asthma   . Cancer (HCC)    Rectal   . Chronic renal disease, stage 3, moderately decreased glomerular filtration rate between 30-59 mL/min/1.73 square meter ([place] 04/27/2014  . Depression   . Diabetes mellitus    years  . GERD (gastroesophageal reflux disease)   . Gout   . History of recurrent UTIs   . Hypertension   . Iron deficiency 04/27/2014  . Mental retardation    stopped school at 9th grade   . Numbness and tingling in hands    x several months   . [place]   . [facility]    more than 4 yrs since last seizure. UNknown etiology  . Shortness of breath    with exertion  . Vitamin B 12 deficiency 02/01/2015   Incidentally found without antibody testing.      Past Surgical History Past Surgical History:  Procedure Laterality Date  . ABDOMINAL HYSTERECTOMY    . ABDOMINAL PERINEAL BOWEL RESECTION N/A 07/26/2013   Procedure:  ABDOMINAL PERINEAL RESECTION;  Surgeon: MJamesetta So MD;  Location: [facility];  Service: General;  Laterality: N/A;  . COLON SURGERY  2015   bowel resection/w colostomy  . COLONOSCOPY N/A 02/04/2013   Procedure: COLONOSCOPY;  Surgeon: NRogene Houston MD;  Location[facility];  Service: Endoscopy;  Laterality: N/A;  225  . COLONOSCOPY N/A 05/12/2014   Procedure: COLONOSCOPY;  Surgeon: NRogene Houston MD;  [facility];  Service: Endoscopy;  Laterality: N/A;  1030  . COLONOSCOPY WITH ESOPHAGOGASTRODUODENOSCOPY (EGD) N/A 01/14/2013   Procedure: COLONOSCOPY WITH ESOPHAGOGASTRODUODENOSCOPY (EGD);  Surgeon: NRogene Houston MD;  [facility];  Service: Endoscopy;  Laterality: N/A;  250-moved to 315 Ann to notify pt  . COLOSTOMY Left 07/26/2013   Procedure: COLOSTOMY;  Surgeon: MJamesetta So MD;  Location: [facility];  Service: General;  Laterality: Left;  . EUS N/A 02/18/2013   Procedure: LOWER ENDOSCOPIC ULTRASOUND (EUS);  Surgeon: DMilus Banister MD;  [facility];  Service: Endoscopy;  Laterality: N/A;  . FLEXIBLE SIGMOIDOSCOPY N/A 07/26/2013  Procedure: FLEXIBLE SIGMOIDOSCOPY;  Surgeon: Jamesetta So, MD;  Location: [facility];  Service: General;  Laterality: N/A;  . LYMPH NODE DISSECTION Left 10/26/2014   Procedure: LYMPH NODE DISSECTION;  Surgeon: [name], MD;  Location: [place];  Service: Thoracic;  Laterality: Left;  [name] MULTIPLE EXTRACTIONS WITH ALVEOLOPLASTY N/A 11/23/2012   Procedure: MULTIPLE EXTRACION 1, 2, 4, 5, 6, 7, 8, 9, 10, 11, 12, 13, 14, 17, 18, 20, 23, 24, 25, 26, 28, 29, 32 WITH ALVEOLOPLASTY, REMOVE BILATERAL TORI;  Surgeon: Gae Bon, DDS;  Location: [place];  Service: Oral Surgery;  Laterality: N/A;  . PORTACATH PLACEMENT Left 01/02/2015   Procedure: INSERTION PORT-A-CATH;  Surgeon: Aviva Signs, MD;  Location[facility];  Service: General;  Laterality: Left;  . SALPINGOOPHORECTOMY Bilateral 07/26/2013   Procedure: SALPINGO OOPHORECTOMY;  Surgeon: Jamesetta So, MD;  Location[facility];  Service: General;  Laterality: Bilateral;  . SEGMENTECOMY Left 10/26/2014   Procedure: LEFT LOWER LOBE SUPERIOR SEGMENTECTOMY;  Surgeon: [name], MD;  Location: [place];  Service: Thoracic;  Laterality: Left;  . SUPRACERVICAL ABDOMINAL HYSTERECTOMY N/A 07/26/2013   Procedure: HYSTERECTOMY SUPRACERVICAL ABDOMINAL ;  Surgeon: Jamesetta So, MD;  [facility];  Service:  General;  Laterality: N/A;  . VIDEO ASSISTED THORACOSCOPY Left 10/26/2014   Procedure: LEFT VIDEO ASSISTED THORACOSCOPY;  Surgeon: [name], MD;  Location: [place];  Service: Thoracic;  Laterality: Left;    Family History History reviewed. No pertinent family history.   Social History  reports that she quit smoking about 9 years ago. Her smoking use included cigarettes. She has never used smokeless tobacco. She reports that she does not drink alcohol or use drugs.  Medications  Current Outpatient Medications:  .  amLODipine (NORVASC) 10 MG tablet, Take 1 tablet (10 mg total) by mouth every morning., Disp: 30 tablet, Rfl: 1 .  aspirin EC 81 MG tablet, Take 81 mg by mouth daily., Disp: , Rfl:  .  calcium carbonate (TUMS) 500 MG chewable tablet, Chew 2 tablets (400 mg of elemental calcium total) by mouth 3 (three) times daily., Disp: 180 tablet, Rfl: 2 .  docusate sodium (COLACE) 100 MG capsule, Take 100 mg by mouth 2 (two) times daily., Disp: , Rfl:  .  ferrous sulfate 325 (65 FE) MG tablet, TAKE (1) TABLET BY MOUTH TWICE DAILY., Disp: 60 tablet, Rfl: 4 .  fish oil-omega-3 fatty acids 1000 MG capsule, Take 1 g by mouth 2 (two) times daily., Disp: , Rfl:  .  Fluticasone-Salmeterol (ADVAIR) 250-50 MCG/DOSE AEPB, Inhale 1 puff into the lungs every 12 (twelve) hours., Disp: , Rfl:  .  furosemide (LASIX) 40 MG tablet, Take 40 mg by mouth daily., Disp: , Rfl:  .  glucose blood (ACCU-CHEK AVIVA PLUS) test strip, 1 each by Other route daily. Use as instructed, Disp: , Rfl:  .  Insulin Glargine (LANTUS SOLOSTAR) 100 UNIT/ML SOPN, Inject 12 Units into the skin at bedtime. , Disp: , Rfl:  .  ipratropium-albuterol (DUONEB) 0.5-2.5 (3) MG/3ML SOLN, , Disp: , Rfl:  .  loratadine-pseudoephedrine (LORATADINE-D 24HR) 10-240 MG per 24 hr tablet, Take 1 tablet by mouth daily., Disp: , Rfl:  .  magnesium oxide (MAG-OX) 400 (241.3 Mg) MG tablet, Take 1 tablet (400 mg total) by mouth daily., Disp: 60  tablet, Rfl: 2 .  metFORMIN (GLUCOPHAGE) 1000 MG tablet, Take 1,000 mg by mouth 2 (two) times daily with a meal., Disp: , Rfl:  .  metoprolol (TOPROL-XL) 200 MG 24 hr tablet, Take 200 mg by mouth daily. , Disp: , Rfl:  .  montelukast (SINGULAIR) 10 MG tablet, Take 10 mg by mouth at bedtime., Disp: , Rfl:  .  mupirocin ointment (BACTROBAN) 2 %, , Disp: , Rfl:  .  NOVOFINE AUTOCOVER 30G X 8 MM MISC, , Disp: , Rfl:  .  OS-CAL CALCIUM + D3 500-200 MG-UNIT TABS, TAKE 2 TABLETS BY MOUTH DAILY WITH BREAKFAST., Disp: 60 tablet, Rfl: 0 .  pantoprazole (PROTONIX) 40 MG tablet, Take 40 mg by mouth every morning. , Disp: , Rfl:  .  phenytoin (DILANTIN) 100 MG ER capsule, Take 100 mg by mouth 2 (two) times daily., Disp: , Rfl:  .  potassium chloride SA (K-DUR,KLOR-CON) 20 MEQ tablet, Take 20 mEq by mouth daily. Reported on 07/07/2015, Disp: , Rfl:  .  pravastatin (PRAVACHOL) 40 MG tablet, Take 40 mg by mouth every evening. , Disp: , Rfl:  .  PROAIR HFA 108 (90 BASE) MCG/ACT inhaler, , Disp: , Rfl:  .  senna-docusate (SENOKOT-S) 8.6-50 MG tablet, Take 1 tablet by mouth 2 (two) times daily as needed for mild constipation., Disp: 60 tablet, Rfl: 3 .  traMADol (ULTRAM) 50 MG tablet, Take 1 tablet (50 mg total) by mouth every 4 (four) hours as needed for moderate pain., Disp: 30 tablet, Rfl: 2 .  traZODone (DESYREL) 50 MG tablet, Take 50 mg by mouth at bedtime. , Disp: , Rfl:  .  triamcinolone cream (KENALOG) 0.1 %, Apply 1 application topically 2 (two) times daily. To stoma, Disp: , Rfl:  No current facility-administered medications for this visit.   Facility-Administered Medications Ordered in Other Visits:  .  sodium chloride flush (NS) 0.9 % injection 10 mL, 10 mL, Intravenous, PRN, Mareta Chesnut, MD, 10 mL at 10/17/17 1015  Allergies Patient has no known allergies.  Review of Systems Review of Systems - Oncology ROS negative    Physical Exam  Vitals Wt Readings from Last 3 Encounters:  10/17/17 256  lb 1.6 oz (116.2 kg)  09/23/17 254 lb 14.4 oz (115.6 kg)  04/23/17 250 lb (113.4 kg)   Temp Readings from Last 3 Encounters:  10/17/17 98.7 F (37.1 C) (Oral)  09/23/17 98.4 F (36.9 C) (Oral)  08/26/17 98.7 F (37.1 C) (Oral)   BP Readings from Last 3 Encounters:  10/17/17 137/66  09/23/17 (!) 168/69  08/26/17 (!) 148/70   Pulse Readings from Last 3 Encounters:  10/17/17 81  09/23/17 74  08/26/17 81    Constitutional: Well-developed, well-nourished, and in no distress.  Pt seated in wheelchair.   HENT: Head: Normocephalic and atraumatic.  Mouth/Throat: No oropharyngeal exudate. Mucosa moist. Eyes: Pupils are equal, round, and reactive to light. Conjunctivae are normal. No scleral icterus.  Neck: Normal range of motion. Neck supple. No JVD present.  Cardiovascular: Normal rate, regular rhythm and normal heart sounds.  Exam reveals no gallop and no friction rub.   No murmur heard. Pulmonary/Chest: Effort normal and breath sounds normal. No respiratory distress. No wheezes.No rales.  Abdominal: Soft. Bowel sounds are normal. No distension. There is no tenderness. There is no guarding. Colostomy noted.   Musculoskeletal: No edema or tenderness.  Lymphadenopathy: No cervical, axillary or supraclavicular adenopathy.  Neurological: Alert and oriented to person, place, and time. No cranial nerve deficit.  Skin: Skin is warm and dry. No rash noted. No erythema. No pallor.  Psychiatric: Affect and judgment normal.   Labs No visits with results within  3 Day(s) from this visit.  Latest known visit with results is:  Appointment on 10/09/2017  Component Date Value Ref Range Status  . WBC 10/09/2017 6.0  4.0 - 10.5 K/uL Final  . RBC 10/09/2017 3.65* 3.87 - 5.11 MIL/uL Final  . Hemoglobin 10/09/2017 10.2* 12.0 - 15.0 g/dL Final  . HCT 10/09/2017 32.3* 36.0 - 46.0 % Final  . MCV 10/09/2017 88.5  78.0 - 100.0 fL Final  . MCH 10/09/2017 27.9  26.0 - 34.0 pg Final  . MCHC 10/09/2017  31.6  30.0 - 36.0 g/dL Final  . RDW 10/09/2017 14.0  11.5 - 15.5 % Final  . Platelets 10/09/2017 185  150 - 400 K/uL Final  . Neutrophils Relative % 10/09/2017 69  % Final  . Neutro Abs 10/09/2017 4.2  1.7 - 7.7 K/uL Final  . Lymphocytes Relative 10/09/2017 15  % Final  . Lymphs Abs 10/09/2017 0.9  0.7 - 4.0 K/uL Final  . Monocytes Relative 10/09/2017 10  % Final  . Monocytes Absolute 10/09/2017 0.6  0.1 - 1.0 K/uL Final  . Eosinophils Relative 10/09/2017 6  % Final  . Eosinophils Absolute 10/09/2017 0.3  0.0 - 0.7 K/uL Final  . Basophils Relative 10/09/2017 0  % Final  . Basophils Absolute 10/09/2017 0.0  0.0 - 0.1 K/uL Final   Performed at [facility], [address] [place], [place] [address]  . Sodium 10/09/2017 136  135 - 145 mmol/L Final  . Potassium 10/09/2017 4.6  3.5 - 5.1 mmol/L Final  . Chloride 10/09/2017 99  98 - 111 mmol/L Final  . CO2 10/09/2017 28  22 - 32 mmol/L Final  . Glucose, Bld 10/09/2017 105* 70 - 99 mg/dL Final  . BUN 10/09/2017 45* 8 - 23 mg/dL Final  . Creatinine, Ser 10/09/2017 1.37* 0.44 - 1.00 mg/dL Final  . Calcium 10/09/2017 8.5* 8.9 - 10.3 mg/dL Final  . Total Protein 10/09/2017 7.2  6.5 - 8.1 g/dL Final  . Albumin 10/09/2017 3.7  3.5 - 5.0 g/dL Final  . AST 10/09/2017 20  15 - 41 U/L Final  . ALT 10/09/2017 17  0 - 44 U/L Final  . Alkaline Phosphatase 10/09/2017 382* 38 - 126 U/L Final  . Total Bilirubin 10/09/2017 0.2* 0.3 - 1.2 mg/dL Final  . GFR calc non Af Amer 10/09/2017 39* >60 mL/min Final  . GFR calc Af Amer 10/09/2017 45* >60 mL/min Final   Comment: (NOTE) The eGFR has been calculated using the CKD EPI equation. This calculation has not been validated in all clinical situations. eGFR's persistently <60 mL/min signify possible Chronic Kidney Disease.   Georgiann Hahn gap 10/09/2017 9  5 - 15 Final   Performed at [facility], [address], [place], [place] [address]  . CEA 10/09/2017 4.2  0.0 - 4.7 ng/mL Final   Comment: (NOTE)                              Nonsmokers          <3.9                             Smokers             <5.6 [employer] Electrochemiluminescence Immunoassay (ECLIA) Values obtained with different assay methods or kits cannot be used interchangeably.  Results cannot be interpreted as absolute evidence of the presence or absence  of malignant disease. Performed At: [facility] [place], [place] [id number] Rush Farmer MD [id number]      Pathology Orders Placed This Encounter  Procedures  . CT CHEST W CONTRAST    Standing Status:   Future    Standing Expiration Date:   10/17/2018    Order Specific Question:   If indicated for the ordered procedure, I authorize the administration of contrast media per Radiology protocol    Answer:   Yes    Order Specific Question:   Preferred imaging location?    Answer:   [facility]    Order Specific Question:   Radiology Contrast Protocol - do NOT remove file path    Answer:   \\[email or web address]  . CT Abdomen Pelvis W Contrast    Standing Status:   Future    Standing Expiration Date:   10/17/2018    Order Specific Question:   If indicated for the ordered procedure, I authorize the administration of contrast media per Radiology protocol    Answer:   Yes    Order Specific Question:   Preferred imaging location?    Answer:   [facility]    Order Specific Question:   Is Oral Contrast requested for this exam?    Answer:   Yes, Per Radiology protocol    Order Specific Question:   Radiology Contrast Protocol - do NOT remove file path    Answer:   \\[email or web address]  . CBC with Differential/Platelet    Standing Status:   Future    Standing Expiration Date:   10/18/2019  . Comprehensive metabolic panel    Standing Status:   Future    Standing Expiration Date:   10/18/2019  . Lactate dehydrogenase    Standing Status:   Future    Standing Expiration Date:   10/18/2019  .  Ferritin    Standing Status:   Future    Standing Expiration Date:   10/18/2019  . CEA    Standing Status:   Future    Standing Expiration Date:   10/18/2019       Zoila Shutter MD

## 2017-10-17 NOTE — Progress Notes (Signed)
[name] tolerated Vit B12 injection and portacath flush well without complaints or incident. Port accessed with 20 gauge needle with blood return noted then flushed with 10 ml NS and 5 ml Heparin easily per protocol then de-accessed. VSS Pt discharged via wheelchair in satisfactory condition accompanied by family member

## 2017-10-17 NOTE — Patient Instructions (Signed)
[facility] Discharge Instructions  Today you saw Dr. Higgs   Thank you for choosing  Cancer Center at [facility] to provide your oncology and hematology care.  To afford each patient quality time with our provider, please arrive at least 15 minutes before your scheduled appointment time.   If you have a lab appointment with the Cancer Center please come in thru the  Main Entrance and check in at the main information desk  You need to re-schedule your appointment should you arrive 10 or more minutes late.  We strive to give you quality time with our providers, and arriving late affects you and other patients whose appointments are after yours.  Also, if you no show three or more times for appointments you may be dismissed from the clinic at the providers discretion.     Again, thank you for choosing [facility].  Our hope is that these requests will decrease the amount of time that you wait before being seen by our physicians.       _____________________________________________________________  Should you have questions after your visit to [facility], please contact our office at [phone] between the hours of 8:00 a.m. and 4:30 p.m.  Voicemails left after 4:00 p.m. will not be returned until the following business day.  For prescription refill requests, have your pharmacy contact our office and allow 72 hours.    Cancer Center Support Programs:   > Cancer Support Group  2nd Tuesday of the month 1pm-2pm, Journey Room   

## 2017-11-14 ENCOUNTER — Other Ambulatory Visit (HOSPITAL_COMMUNITY): Payer: Self-pay | Admitting: Internal Medicine

## 2017-11-19 ENCOUNTER — Ambulatory Visit (HOSPITAL_COMMUNITY)
Admission: RE | Admit: 2017-11-19 | Discharge: 2017-11-19 | Disposition: A | Discharge status: Home / Self Care | Payer: Medicare Other | Source: Ambulatory Visit | Attending: Pulmonary Disease | Admitting: Pulmonary Disease

## 2017-11-19 ENCOUNTER — Inpatient Hospital Stay (HOSPITAL_COMMUNITY): Payer: Medicare Other | Attending: Oncology

## 2017-11-19 ENCOUNTER — Other Ambulatory Visit (HOSPITAL_COMMUNITY): Payer: Self-pay | Admitting: Pulmonary Disease

## 2017-11-19 ENCOUNTER — Ambulatory Visit (HOSPITAL_COMMUNITY): Payer: Medicare Other

## 2017-11-19 ENCOUNTER — Encounter (HOSPITAL_COMMUNITY): Payer: Self-pay

## 2017-11-19 VITALS — BP 154/60 | HR 78 | Temp 98.7°F | Resp 18

## 2017-11-19 DIAGNOSIS — R059 Cough, unspecified: Secondary | ICD-10-CM

## 2017-11-19 DIAGNOSIS — E538 Deficiency of other specified B group vitamins: Secondary | ICD-10-CM | POA: Insufficient documentation

## 2017-11-19 DIAGNOSIS — R05 Cough: Secondary | ICD-10-CM

## 2017-11-19 MED ORDER — CYANOCOBALAMIN 1000 MCG/ML IJ SOLN
INTRAMUSCULAR | Status: AC
  Filled 2017-11-19: qty 1

## 2017-11-19 MED ORDER — CYANOCOBALAMIN 1000 MCG/ML IJ SOLN
1000.0000 ug | Freq: Once | INTRAMUSCULAR | Status: AC
  Administered 2017-11-19: 1000 ug via INTRAMUSCULAR

## 2017-11-19 NOTE — Patient Instructions (Signed)
[facility] at [facility]  Discharge Instructions:  You received a b12 shot today.  _______________________________________________________________  Thank you for choosing [facility] at [facility] to provide your oncology and hematology care.  To afford each patient quality time with our providers, please arrive at least 15 minutes before your scheduled appointment.  You need to re-schedule your appointment if you arrive 10 or more minutes late.  We strive to give you quality time with our providers, and arriving late affects you and other patients whose appointments are after yours.  Also, if you no show three or more times for appointments you may be dismissed from the clinic.  Again, thank you for choosing [facility]. Our hope is that these requests will allow you access to exceptional care and in a timely manner. _______________________________________________________________  If you have questions after your visit, please contact our office at [phone] between the hours of 8:30 a.m. and 5:00 p.m. Voicemails left after 4:30 p.m. will not be returned until the following business day. _______________________________________________________________  For prescription refill requests, have your pharmacy contact our office. _______________________________________________________________  Recommendations made by the consultant and any test results will be sent to your referring physician. _______________________________________________________________ 

## 2017-11-19 NOTE — Progress Notes (Signed)
Patient tolerated injection with no complaints voiced.  Site clean and dry with no bruising or swelling noted.  Band aid applied.  VSs with discharge and left by wheelchair with no s/s of distress noted.

## 2017-11-30 ENCOUNTER — Other Ambulatory Visit: Payer: Self-pay

## 2017-11-30 ENCOUNTER — Emergency Department (HOSPITAL_COMMUNITY): Payer: Medicare Other

## 2017-11-30 ENCOUNTER — Encounter (HOSPITAL_COMMUNITY): Payer: Self-pay

## 2017-11-30 ENCOUNTER — Emergency Department (HOSPITAL_COMMUNITY)
Admission: EM | Admit: 2017-11-30 | Discharge: 2017-11-30 | Disposition: A | Discharge status: Home / Self Care | Payer: Medicare Other | Attending: Emergency Medicine | Admitting: Emergency Medicine

## 2017-11-30 DIAGNOSIS — N183 Chronic kidney disease, stage 3 (moderate): Secondary | ICD-10-CM | POA: Insufficient documentation

## 2017-11-30 DIAGNOSIS — S62102A Fracture of unspecified carpal bone, left wrist, initial encounter for closed fracture: Secondary | ICD-10-CM | POA: Diagnosis not present

## 2017-11-30 DIAGNOSIS — W19XXXA Unspecified fall, initial encounter: Secondary | ICD-10-CM

## 2017-11-30 DIAGNOSIS — W0110XA Fall on same level from slipping, tripping and stumbling with subsequent striking against unspecified object, initial encounter: Secondary | ICD-10-CM | POA: Diagnosis not present

## 2017-11-30 DIAGNOSIS — Z87891 Personal history of nicotine dependence: Secondary | ICD-10-CM | POA: Diagnosis not present

## 2017-11-30 DIAGNOSIS — Y999 Unspecified external cause status: Secondary | ICD-10-CM | POA: Diagnosis not present

## 2017-11-30 DIAGNOSIS — Y939 Activity, unspecified: Secondary | ICD-10-CM | POA: Diagnosis not present

## 2017-11-30 DIAGNOSIS — E1122 Type 2 diabetes mellitus with diabetic chronic kidney disease: Secondary | ICD-10-CM | POA: Insufficient documentation

## 2017-11-30 DIAGNOSIS — I129 Hypertensive chronic kidney disease with stage 1 through stage 4 chronic kidney disease, or unspecified chronic kidney disease: Secondary | ICD-10-CM | POA: Diagnosis not present

## 2017-11-30 DIAGNOSIS — J45909 Unspecified asthma, uncomplicated: Secondary | ICD-10-CM | POA: Insufficient documentation

## 2017-11-30 DIAGNOSIS — S6992XA Unspecified injury of left wrist, hand and finger(s), initial encounter: Secondary | ICD-10-CM | POA: Diagnosis present

## 2017-11-30 DIAGNOSIS — Y929 Unspecified place or not applicable: Secondary | ICD-10-CM | POA: Diagnosis not present

## 2017-11-30 LAB — CBC WITH DIFFERENTIAL/PLATELET
Basophils Absolute: 0 10*3/uL (ref 0.0–0.1)
Basophils Relative: 0 %
Eosinophils Absolute: 0.3 10*3/uL (ref 0.0–0.7)
Eosinophils Relative: 5 %
HEMATOCRIT: 33.3 % — AB (ref 36.0–46.0)
HEMOGLOBIN: 10.3 g/dL — AB (ref 12.0–15.0)
LYMPHS ABS: 0.8 10*3/uL (ref 0.7–4.0)
Lymphocytes Relative: 13 %
MCH: 27.5 pg (ref 26.0–34.0)
MCHC: 30.9 g/dL (ref 30.0–36.0)
MCV: 89 fL (ref 78.0–100.0)
MONOS PCT: 8 %
Monocytes Absolute: 0.5 10*3/uL (ref 0.1–1.0)
NEUTROS ABS: 4.7 10*3/uL (ref 1.7–7.7)
NEUTROS PCT: 74 %
Platelets: 195 10*3/uL (ref 150–400)
RBC: 3.74 MIL/uL — ABNORMAL LOW (ref 3.87–5.11)
RDW: 14.3 % (ref 11.5–15.5)
WBC: 6.4 10*3/uL (ref 4.0–10.5)

## 2017-11-30 LAB — COMPREHENSIVE METABOLIC PANEL
ALK PHOS: 372 U/L — AB (ref 38–126)
ALT: 28 U/L (ref 0–44)
ANION GAP: 12 (ref 5–15)
AST: 25 U/L (ref 15–41)
Albumin: 3.8 g/dL (ref 3.5–5.0)
BILIRUBIN TOTAL: 0.5 mg/dL (ref 0.3–1.2)
BUN: 38 mg/dL — ABNORMAL HIGH (ref 8–23)
CO2: 28 mmol/L (ref 22–32)
Calcium: 8.9 mg/dL (ref 8.9–10.3)
Chloride: 98 mmol/L (ref 98–111)
Creatinine, Ser: 1.42 mg/dL — ABNORMAL HIGH (ref 0.44–1.00)
GFR calc Af Amer: 44 mL/min — ABNORMAL LOW (ref >60)
GFR calc non Af Amer: 38 mL/min — ABNORMAL LOW (ref >60)
GLUCOSE: 134 mg/dL — AB (ref 70–99)
Potassium: 4.6 mmol/L (ref 3.5–5.1)
Sodium: 138 mmol/L (ref 135–145)
TOTAL PROTEIN: 7.2 g/dL (ref 6.5–8.1)

## 2017-11-30 MED ORDER — OXYCODONE-ACETAMINOPHEN 5-325 MG PO TABS: 1.0000 | ORAL_TABLET | Freq: Three times a day (TID) | ORAL | 0 refills | Status: DC | PRN

## 2017-11-30 MED ORDER — FENTANYL CITRATE (PF) 100 MCG/2ML IJ SOLN
50.0000 ug | Freq: Once | INTRAMUSCULAR | Status: AC
  Administered 2017-11-30: 50 ug via INTRAVENOUS
  Filled 2017-11-30: qty 2

## 2017-11-30 NOTE — ED Notes (Signed)
[name] contacted to arrange patient pick-up.

## 2017-11-30 NOTE — ED Provider Notes (Signed)
Emergency Department Provider Note   I have reviewed the triage vital signs and the nursing notes.   HISTORY  Chief Complaint Fall   HPI Maritssa Pardue is a 67 y.o. female with multiple medical problems as documented below that had a fall today on outstretched arms and subsequently has left wrist, right wrist and hand, left shoulder pain.  Did not hit her head.  Did not pass out.  Does not have any leg, chest, abdomen or back pain. No other associated or modifying symptoms.    Past Medical History:  Diagnosis Date  . Arthritis   . Asthma   . Cancer (HCC)    Rectal   . Chronic renal disease, stage 3, moderately decreased glomerular filtration rate between 30-59 mL/min/1.73 square meter ([place]) 04/27/2014  . Depression   . Diabetes mellitus    years  . GERD (gastroesophageal reflux disease)   . Gout   . History of recurrent UTIs   . Hypertension   . Iron deficiency 04/27/2014  . Mental retardation    stopped school at 9th grade   . Numbness and tingling in hands    x several months   . Rectal cancer ([place])   . Seizures ([place])    more than 4 yrs since last seizure. UNknown etiology  . Shortness of breath    with exertion  . Vitamin B 12 deficiency 02/01/2015   Incidentally found without antibody testing.      Patient Active Problem List   Diagnosis Date Noted  . Vitamin B 12 deficiency 02/01/2015  . Lung nodule 10/26/2014  . Iron deficiency 04/27/2014  . Chronic renal disease, stage 3, moderately decreased glomerular filtration rate between 30-59 mL/min/1.73 square meter ([place]) 04/27/2014  . Incidental pulmonary nodule, greater than or equal to 29mm 04/26/2014  . Abdominal pain 04/26/2014  . Insomnia 04/26/2014  . Rectal cancer metastasized to lung ([place]) 02/18/2013  . Helicobacter pylori gastritis 02/08/2013  . Elevated liver enzymes 12/02/2012  . Anemia 08/29/2008  . ASTHMA, WITH ACUTE EXACERBATION 07/01/2008  . FLANK PAIN, LEFT 05/27/2008  . SINUS TACHYCARDIA  03/02/2008  . INTERTRIGO, CANDIDAL 10/19/2007  . DIABETES MELLITUS, TYPE II, UNCONTROLLED 07/27/2007  . LIVER FUNCTION TESTS, ABNORMAL 04/28/2007  . UNSPECIFIED SLEEP DISTURBANCE 02/03/2007  . DISEASE, PANCREAS NOS 11/20/2006  . HYPERLIPIDEMIA 11/03/2006  . GOUT NOS 11/03/2006  . DEPRESSION 11/03/2006  . RETARDATION, MENTAL NOS 11/03/2006  . Essential hypertension 11/03/2006  . ALLERGIC RHINITIS 11/03/2006  . ASTHMA 11/03/2006  . ARTHRITIS 11/03/2006  . SEIZURE DISORDER 11/03/2006  . MIGRAINES, HX OF 11/03/2006    Past Surgical History:  Procedure Laterality Date  . ABDOMINAL HYSTERECTOMY    . ABDOMINAL PERINEAL BOWEL RESECTION N/A 07/26/2013   Procedure:  ABDOMINAL PERINEAL RESECTION;  Surgeon: Jamesetta So, MD;  [facility];  Service: General;  Laterality: N/A;  . COLON SURGERY  2015   bowel resection/w colostomy  . COLONOSCOPY N/A 02/04/2013   Procedure: COLONOSCOPY;  Surgeon: Rogene Houston, MD;  [facility];  Service: Endoscopy;  Laterality: N/A;  225  . COLONOSCOPY N/A 05/12/2014   Procedure: COLONOSCOPY;  Surgeon: Rogene Houston, MD;  [facility];  Service: Endoscopy;  Laterality: N/A;  1030  . COLONOSCOPY WITH ESOPHAGOGASTRODUODENOSCOPY (EGD) N/A 01/14/2013   Procedure: COLONOSCOPY WITH ESOPHAGOGASTRODUODENOSCOPY (EGD);  Surgeon: Rogene Houston, MD;  [facility];  Service: Endoscopy;  Laterality: N/A;  250-moved to 315 Ann to notify pt  . COLOSTOMY Left  07/26/2013   Procedure: COLOSTOMY;  Surgeon: Jamesetta So, MD;  [facility];  Service: General;  Laterality: Left;  . EUS N/A 02/18/2013   Procedure: LOWER ENDOSCOPIC ULTRASOUND (EUS);  Surgeon: Milus Banister, MD;  Location: [facility];  Service: Endoscopy;  Laterality: N/A;  . FLEXIBLE SIGMOIDOSCOPY N/A 07/26/2013   Procedure: FLEXIBLE SIGMOIDOSCOPY;  Surgeon: Jamesetta So, MD;  [facility];  Service: General;  Laterality: N/A;  . LYMPH NODE DISSECTION Left  10/26/2014   Procedure: LYMPH NODE DISSECTION;  Surgeon: [name], MD;  Location: [place];  Service: Thoracic;  Laterality: Left;  [name] MULTIPLE EXTRACTIONS WITH ALVEOLOPLASTY N/A 11/23/2012   Procedure: MULTIPLE EXTRACION 1, 2, 4, 5, 6, 7, 8, 9, 10, 11, 12, 13, 14, 17, 18, 20, 23, 24, 25, 26, 28, 29, 32 WITH ALVEOLOPLASTY, REMOVE BILATERAL TORI;  Surgeon: Gae Bon, DDS;  Location: [place];  Service: Oral Surgery;  Laterality: N/A;  . PORTACATH PLACEMENT Left 01/02/2015   Procedure: INSERTION PORT-A-CATH;  Surgeon: Aviva Signs, MD;  [facility];  Service: General;  Laterality: Left;  . SALPINGOOPHORECTOMY Bilateral 07/26/2013   Procedure: SALPINGO OOPHORECTOMY;  Surgeon: Jamesetta So, MD;  [facility];  Service: General;  Laterality: Bilateral;  . SEGMENTECOMY Left 10/26/2014   Procedure: LEFT LOWER LOBE SUPERIOR SEGMENTECTOMY;  Surgeon: [name], MD;  Location: [place];  Service: Thoracic;  Laterality: Left;  . SUPRACERVICAL ABDOMINAL HYSTERECTOMY N/A 07/26/2013   Procedure: HYSTERECTOMY SUPRACERVICAL ABDOMINAL ;  Surgeon: Jamesetta So, MD;  [facility];  Service: General;  Laterality: N/A;  . VIDEO ASSISTED THORACOSCOPY Left 10/26/2014   Procedure: LEFT VIDEO ASSISTED THORACOSCOPY;  Surgeon: [name], MD;  Location: [place];  Service: Thoracic;  Laterality: Left;    Current Outpatient Rx  . Order #: [id number] Class: Historical Med  . Order #: [id number] Class: Normal  . Order #: [id number] Class: Historical Med  . Order #: [id number] Class: Historical Med  . Order #: [id number] Class: Historical Med  . Order #: [id number] Class: Historical Med  . Order #: [id number] Class: Historical Med  . Order #: [id number] Class: Historical Med  . Order #: [id number] Class: Normal  . Order #: [id number] Class: Historical Med  . Order #: [id number] Class: Historical Med  . Order #: [id number] Class: Historical Med  . Order #: [id number] Class: Print  . Order #: [id number] Class: Print     Allergies Patient has no known allergies.  No family history on file.  Social History Social History   Tobacco Use  . Smoking status: Former Smoker    Types: Cigarettes    Last attempt to quit: 07/16/2008    Years since quitting: 9.3  . Smokeless tobacco: Never Used  . Tobacco comment: unknown when stopped smoking      long time  Substance Use Topics  . Alcohol use: No  . Drug use: No    Review of Systems  All other systems negative except as documented in the HPI. All pertinent positives and negatives as reviewed in the HPI. ____________________________________________   PHYSICAL EXAM:  VITAL SIGNS: ED Triage Vitals  Enc Vitals Group     BP 11/30/17 1612 (!) 172/93     Pulse Rate 11/30/17 1612 93     Resp 11/30/17 1612 20     Temp 11/30/17 1612 98.3 F (36.8 C)     Temp Source 11/30/17 1612 Oral     SpO2 11/30/17 1612 97 %     Weight 11/30/17 1610 255 lb 11.7 oz (116  kg)    Constitutional: Alert and oriented. Well appearing and in no acute distress. Eyes: Conjunctivae are normal. PERRL. EOMI. Head: Atraumatic. Nose: No congestion/rhinnorhea. Mouth/Throat: Mucous membranes are moist.  Oropharynx non-erythematous. Neck: No stridor.  No meningeal signs.   Cardiovascular: Normal rate, regular rhythm. Good peripheral circulation. Grossly normal heart sounds.   Respiratory: Normal respiratory effort.  No retractions. Lungs CTAB. Gastrointestinal: Soft and nontender. No distention.  Musculoskeletal: left distal radius ttp/ecchymosis/swelling, right wrist and hand ttp laterally, ecchymosis and ttp over posterior left shoulder, BLE without deformity/ttp/ecchymosis, chest without ttp, C/T/L spine without ttp. No obvious injury to head face.  Neurologic:  Normal speech and language. No gross focal neurologic deficits are appreciated.  Skin:  Skin is warm, dry and intact. No rash noted.   ____________________________________________   LABS (all labs ordered are  listed, but only abnormal results are displayed)  Labs Reviewed  CBC WITH DIFFERENTIAL/PLATELET - Abnormal; Notable for the following components:      Result Value   RBC 3.74 (*)    Hemoglobin 10.3 (*)    HCT 33.3 (*)    All other components within normal limits  COMPREHENSIVE METABOLIC PANEL - Abnormal; Notable for the following components:   Glucose, Bld 134 (*)    BUN 38 (*)    Creatinine, Ser 1.42 (*)    Alkaline Phosphatase 372 (*)    GFR calc non Af Amer 38 (*)    GFR calc Af Amer 44 (*)    All other components within normal limits   ____________________________________________  RADIOLOGY  Dg Forearm Left  Result Date: 11/30/2017 CLINICAL DATA:  Fall while bending over with left arm pain. EXAM: LEFT FOREARM - 2 VIEW COMPARISON:  None. FINDINGS: Examination demonstrates mild prominence of the anterior fat pad at the elbow. Prominent enthesophyte over the olecranon. Tiny curvilinear fragment adjacent the dorsal aspect of the distal radius at the level of the radiocarpal joint which may represent subtle chip fracture. Remainder of the exam is unremarkable. IMPRESSION: Suggestion a tiny chip fracture adjacent the dorsal aspect of the distal radius at the wrist. Electronically Signed   By: Marin Olp M.D.   On: 11/30/2017 18:45   Dg Forearm Right  Result Date: 11/30/2017 CLINICAL DATA:  Fall while bending over.  Right arm pain. EXAM: RIGHT FOREARM - 2 VIEW COMPARISON:  None. FINDINGS: Mild degenerative changes over the elbow and wrist/carpal bones. No acute fracture or dislocation. Remainder of the exam is unremarkable. IMPRESSION: No acute findings. Electronically Signed   By: Marin Olp M.D.   On: 11/30/2017 18:46   Dg Shoulder Left  Result Date: 11/30/2017 CLINICAL DATA:  Fall while bending over with left shoulder pain. EXAM: LEFT SHOULDER - 2+ VIEW COMPARISON:  None. FINDINGS: Mild degenerate change of the glenohumeral joint. Narrowing of the acromial humeral joint space  suggesting rotator cuff tear. No acute fracture or dislocation. IMPRESSION: No acute findings. Electronically Signed   By: Marin Olp M.D.   On: 11/30/2017 18:47   Dg Hand Complete Right  Result Date: 11/30/2017 CLINICAL DATA:  Fall while bending over with right hand pain. EXAM: RIGHT HAND - COMPLETE 3+ VIEW COMPARISON:  None. FINDINGS: Mild degenerative change over the radiocarpal joint and carpal bones as well as first carpometacarpal joint. Minimal degenerative change of the interphalangeal joints. No acute fracture or dislocation. IMPRESSION: No acute findings. Electronically Signed   By: Marin Olp M.D.   On: 11/30/2017 18:50    ____________________________________________   INITIAL  IMPRESSION / ASSESSMENT AND PLAN / ED COURSE  xr affected body parts. Disposition appropriately.   Work-up fully unremarkable.  No large fractures to be worried about.  Will follow with PCP for small avulsion fracture IN her left wrist.  Otherwise stable for discharge at this time.  Pertinent labs & imaging results that were available during my care of the patient were reviewed by me and considered in my medical decision making (see chart for details).  ____________________________________________  FINAL CLINICAL IMPRESSION(S) / ED DIAGNOSES  Final diagnoses:  Fall, initial encounter  Avulsion fracture of left wrist     MEDICATIONS GIVEN DURING THIS VISIT:  Medications  fentaNYL (SUBLIMAZE) injection 50 mcg (50 mcg Intravenous Given 11/30/17 1709)     NEW OUTPATIENT MEDICATIONS STARTED DURING THIS VISIT:  Discharge Medication List as of 11/30/2017  7:22 PM    START taking these medications   Details  !! oxyCODONE-acetaminophen (PERCOCET) 5-325 MG tablet Take 1 tablet by mouth every 8 (eight) hours as needed for severe pain., Starting Sun 11/30/2017, Print    !! oxyCODONE-acetaminophen (PERCOCET/ROXICET) 5-325 MG tablet Take 1 tablet by mouth every 8 (eight) hours as needed for severe  pain., Starting Sun 11/30/2017, Print     !! - Potential duplicate medications found. Please discuss with provider.      Note:  This note was prepared with assistance of Dragon voice recognition software. Occasional wrong-word or sound-a-like substitutions may have occurred due to the inherent limitations of voice recognition software.   Merrily Pew, MD 11/30/17 2110

## 2017-11-30 NOTE — ED Triage Notes (Signed)
Pt resident at [facility].  Reports was outside and leaned over to throw something away and fell forwards onto the deck.  Pt c/o pain in left shoulder and left wrist.  No loss of consciousness.

## 2017-11-30 NOTE — ED Notes (Signed)
Ice pack applied to left wrist

## 2017-12-01 MED FILL — Oxycodone w/ Acetaminophen Tab 5-325 MG: ORAL | Qty: 6 | Status: AC

## 2017-12-09 ENCOUNTER — Ambulatory Visit (INDEPENDENT_AMBULATORY_CARE_PROVIDER_SITE_OTHER): Payer: Medicare Other | Admitting: Orthopaedic Surgery

## 2017-12-09 ENCOUNTER — Encounter: Payer: Self-pay | Admitting: Orthopaedic Surgery

## 2017-12-09 VITALS — BP 161/78 | HR 78 | Ht 66.0 in | Wt 256.0 lb

## 2017-12-09 DIAGNOSIS — S66912A Strain of unspecified muscle, fascia and tendon at wrist and hand level, left hand, initial encounter: Secondary | ICD-10-CM | POA: Diagnosis not present

## 2017-12-09 NOTE — Progress Notes (Signed)
Subjective:    Patient ID: [name], female    DOB: 09-15-1950, 67 y.o.   MRN: [id number]  HPI She fell and hurt her left and right wrists, her arms and shoulders.  She was seen in the ER and had multiple x-rays done.  This was on 11-30-17.  I have reviewed the notes, the x-rays.  There is a small avulsion off the distal dorsal radius which was thought to be a possible fracture.  I have reviewed the films and I think this is old as it is very smooth.    She was given a cock-up splint.  She has much less pain today.  She has some tenderness of the left shoulder  She is a resident at a local rest/nursing home.   Review of Systems  Constitutional: Positive for activity change.  Respiratory: Positive for shortness of breath.   Musculoskeletal: Positive for arthralgias.  Neurological: Positive for seizures.  All other systems reviewed and are negative.  For Review of Systems, all other systems reviewed and are negative.  The following is a summary of the past history medically, past history surgically, known current medicines, social history and family history.  This information is gathered electronically by the computer from prior information and documentation.  I review this each visit and have found including this information at this point in the chart is beneficial and informative.   Past Medical History:  Diagnosis Date  . Arthritis   . Asthma   . Cancer (HCC)    Rectal   . Chronic renal disease, stage 3, moderately decreased glomerular filtration rate between 30-59 mL/min/1.73 square meter ([place]) 04/27/2014  . Depression   . Diabetes mellitus    years  . GERD (gastroesophageal reflux disease)   . Gout   . History of recurrent UTIs   . Hypertension   . Iron deficiency 04/27/2014  . Mental retardation    stopped school at 9th grade   . Numbness and tingling in hands    x several months   . Rectal cancer ([place])   . Seizures ([place])    more than 4 yrs since last seizure.  UNknown etiology  . Shortness of breath    with exertion  . Vitamin B 12 deficiency 02/01/2015   Incidentally found without antibody testing.      Past Surgical History:  Procedure Laterality Date  . ABDOMINAL HYSTERECTOMY    . ABDOMINAL PERINEAL BOWEL RESECTION N/A 07/26/2013   Procedure:  ABDOMINAL PERINEAL RESECTION;  Surgeon: Jamesetta So, MD;  [facility];  Service: General;  Laterality: N/A;  . COLON SURGERY  2015   bowel resection/w colostomy  . COLONOSCOPY N/A 02/04/2013   Procedure: COLONOSCOPY;  Surgeon: Rogene Houston, MD;  [facility];  Service: Endoscopy;  Laterality: N/A;  225  . COLONOSCOPY N/A 05/12/2014   Procedure: COLONOSCOPY;  Surgeon: Rogene Houston, MD;  [facility];  Service: Endoscopy;  Laterality: N/A;  1030  . COLONOSCOPY WITH ESOPHAGOGASTRODUODENOSCOPY (EGD) N/A 01/14/2013   Procedure: COLONOSCOPY WITH ESOPHAGOGASTRODUODENOSCOPY (EGD);  Surgeon: Rogene Houston, MD;  [facility];  Service: Endoscopy;  Laterality: N/A;  250-moved to 315 Ann to notify pt  . COLOSTOMY Left 07/26/2013   Procedure: COLOSTOMY;  Surgeon: Jamesetta So, MD;  [facility];  Service: General;  Laterality: Left;  . EUS N/A 02/18/2013   Procedure: LOWER ENDOSCOPIC ULTRASOUND (EUS);  Surgeon: Milus Banister, MD;  [facility];  Service: Endoscopy;  Laterality: N/A;  . FLEXIBLE SIGMOIDOSCOPY N/A 07/26/2013   Procedure: FLEXIBLE SIGMOIDOSCOPY;  Surgeon: Jamesetta So, MD;  [facility];  Service: General;  Laterality: N/A;  . LYMPH NODE DISSECTION Left 10/26/2014   Procedure: LYMPH NODE DISSECTION;  Surgeon: [name], MD;  Location: [place];  Service: Thoracic;  Laterality: Left;  [name] MULTIPLE EXTRACTIONS WITH ALVEOLOPLASTY N/A 11/23/2012   Procedure: MULTIPLE EXTRACION 1, 2, 4, 5, 6, 7, 8, 9, 10, 11, 12, 13, 14, 17, 18, 20, 23, 24, 25, 26, 28, 29, 32 WITH ALVEOLOPLASTY, REMOVE BILATERAL TORI;  Surgeon: Gae Bon, DDS;   Location: [place];  Service: Oral Surgery;  Laterality: N/A;  . PORTACATH PLACEMENT Left 01/02/2015   Procedure: INSERTION PORT-A-CATH;  Surgeon: Aviva Signs, MD;  [facility];  Service: General;  Laterality: Left;  . SALPINGOOPHORECTOMY Bilateral 07/26/2013   Procedure: SALPINGO OOPHORECTOMY;  Surgeon: Jamesetta So, MD;  [facility];  Service: General;  Laterality: Bilateral;  . SEGMENTECOMY Left 10/26/2014   Procedure: LEFT LOWER LOBE SUPERIOR SEGMENTECTOMY;  Surgeon: [name], MD;  Location: [place];  Service: Thoracic;  Laterality: Left;  . SUPRACERVICAL ABDOMINAL HYSTERECTOMY N/A 07/26/2013   Procedure: HYSTERECTOMY SUPRACERVICAL ABDOMINAL ;  Surgeon: Jamesetta So, MD;  [facility];  Service: General;  Laterality: N/A;  . VIDEO ASSISTED THORACOSCOPY Left 10/26/2014   Procedure: LEFT VIDEO ASSISTED THORACOSCOPY;  Surgeon: [name], MD;  Location: [place];  Service: Thoracic;  Laterality: Left;    Current Outpatient Medications on File Prior to Visit  Medication Sig Dispense Refill  . aspirin EC 81 MG tablet Take 81 mg by mouth daily.    . calcium carbonate (TUMS) 500 MG chewable tablet Chew 2 tablets (400 mg of elemental calcium total) by mouth 3 (three) times daily. 180 tablet 2  . calcium-vitamin D (OSCAL WITH D) 500-200 MG-UNIT tablet Take 2 tablets by mouth daily with breakfast.    . docusate sodium (COLACE) 100 MG capsule Take 100 mg by mouth 2 (two) times daily.    . fish oil-omega-3 fatty acids 1000 MG capsule Take 1 g by mouth 2 (two) times daily.    . Fluticasone-Salmeterol (ADVAIR) 250-50 MCG/DOSE AEPB Inhale 1 puff into the lungs every 12 (twelve) hours.    . metoprolol (TOPROL-XL) 200 MG 24 hr tablet Take 200 mg by mouth every evening.     . montelukast (SINGULAIR) 10 MG tablet Take 10 mg by mouth at bedtime.    Dellia Nims CALCIUM + D3 500-200 MG-UNIT TABS TAKE 2 TABLETS BY MOUTH DAILY WITH BREAKFAST. (Patient taking differently: Take 2 tablets  by mouth daily with breakfast. ) 60 tablet 0  . oxyCODONE-acetaminophen (PERCOCET) 5-325 MG tablet Take 1 tablet by mouth every 8 (eight) hours as needed for severe pain. 10 tablet 0  . oxyCODONE-acetaminophen (PERCOCET/ROXICET) 5-325 MG tablet Take 1 tablet by mouth every 8 (eight) hours as needed for severe pain. 6 tablet 0  . pantoprazole (PROTONIX) 40 MG tablet Take 40 mg by mouth every morning.     . phenytoin (DILANTIN) 100 MG ER capsule Take 100 mg by mouth 2 (two) times daily.    . pravastatin (PRAVACHOL) 40 MG tablet Take 40 mg by mouth every evening.      No current facility-administered medications on file prior to visit.     Social History   Socioeconomic History  . Marital status: Single    Spouse name: Not on file  .  Number of children: Not on file  . Years of education: Not on file  . Highest education level: Not on file  Occupational History  . Not on file  Social Needs  . Financial resource strain: Not on file  . Food insecurity:    Worry: Not on file    Inability: Not on file  . Transportation needs:    Medical: Not on file    Non-medical: Not on file  Tobacco Use  . Smoking status: Former Smoker    Types: Cigarettes    Last attempt to quit: 07/16/2008    Years since quitting: 9.4  . Smokeless tobacco: Never Used  . Tobacco comment: unknown when stopped smoking      long time  Substance and Sexual Activity  . Alcohol use: No  . Drug use: No  . Sexual activity: Not on file  Lifestyle  . Physical activity:    Days per week: Not on file    Minutes per session: Not on file  . Stress: Not on file  Relationships  . Social connections:    Talks on phone: Not on file    Gets together: Not on file    Attends religious service: Not on file    Active member of club or organization: Not on file    Attends meetings of clubs or organizations: Not on file    Relationship status: Not on file  . Intimate partner violence:    Fear of current or ex partner: Not on  file    Emotionally abused: Not on file    Physically abused: Not on file    Forced sexual activity: Not on file  Other Topics Concern  . Not on file  Social History Narrative  . Not on file    Family History  Family history unknown: Yes    BP (!) 161/78   Pulse 78   Ht 5\' 6"  (1.676 m)   Wt 256 lb (116.1 kg)   BMI 41.32 kg/m   Body mass index is 41.32 kg/m.      Objective:   Physical Exam  Constitutional: She is oriented to person, place, and time. She appears well-developed and well-nourished.  HENT:  Head: Normocephalic and atraumatic.  Eyes: Pupils are equal, round, and reactive to light. Conjunctivae and EOM are normal.  Neck: Normal range of motion. Neck supple.  Cardiovascular: Normal rate, regular rhythm and intact distal pulses.  Pulmonary/Chest: Effort normal.  Abdominal: Soft.  Musculoskeletal:       Left wrist: She exhibits tenderness.       Arms: Neurological: She is alert and oriented to person, place, and time. She has normal reflexes. She displays normal reflexes. No cranial nerve deficit. She exhibits normal muscle tone. Coordination normal.  Skin: Skin is warm and dry.  Psychiatric: She has a normal mood and affect. Her behavior is normal. Judgment and thought content normal.   Her shoulder on the left has full motion and only very minimal tenderness.  NV intact.       Assessment & Plan:   Encounter Diagnosis  Name Primary?  . Strain of left wrist, initial encounter Yes   I have told her I think her injury is a strain not a fracture.  She is to use ice or heat and continue the splint as needed.  Forms for the rest home completed.  Return in two weeks.  Call if any problem.  Precautions discussed.   Electronically Signed Sanjuana Kava, MD 9/24/20192:01 PM

## 2017-12-19 ENCOUNTER — Encounter (HOSPITAL_COMMUNITY): Payer: Self-pay

## 2017-12-19 ENCOUNTER — Inpatient Hospital Stay (HOSPITAL_COMMUNITY): Payer: Medicare Other | Attending: Oncology

## 2017-12-19 VITALS — BP 160/63 | HR 75 | Temp 98.6°F | Resp 18

## 2017-12-19 DIAGNOSIS — E538 Deficiency of other specified B group vitamins: Secondary | ICD-10-CM | POA: Diagnosis not present

## 2017-12-19 MED ORDER — CYANOCOBALAMIN 1000 MCG/ML IJ SOLN
1000.0000 ug | Freq: Once | INTRAMUSCULAR | Status: AC
  Administered 2017-12-19: 1000 ug via INTRAMUSCULAR

## 2017-12-19 NOTE — Patient Instructions (Signed)
[facility] at [facility] Discharge Instructions  Received Vit B12 injection today. Follow-up as scheduled. Call clinic for any questions or concerns   Thank you for choosing [facility] at [facility] to provide your oncology and hematology care.  To afford each patient quality time with our provider, please arrive at least 15 minutes before your scheduled appointment time.   If you have a lab appointment with the Cancer Center please come in thru the  Main Entrance and check in at the main information desk  You need to re-schedule your appointment should you arrive 10 or more minutes late.  We strive to give you quality time with our providers, and arriving late affects you and other patients whose appointments are after yours.  Also, if you no show three or more times for appointments you may be dismissed from the clinic at the providers discretion.     Again, thank you for choosing [facility].  Our hope is that these requests will decrease the amount of time that you wait before being seen by our physicians.       _____________________________________________________________  Should you have questions after your visit to [facility], please contact our office at [phone] between the hours of 8:00 a.m. and 4:30 p.m.  Voicemails left after 4:00 p.m. will not be returned until the following business day.  For prescription refill requests, have your pharmacy contact our office and allow 72 hours.    Cancer Center Support Programs:   > Cancer Support Group  2nd Tuesday of the month 1pm-2pm, Journey Room   

## 2017-12-19 NOTE — Progress Notes (Signed)
[name] tolerated Vit B12 injection well without complaints or incident. VSS Pt discharged via wheelchair in satisfactory condition accompanied by her caregiver

## 2017-12-23 ENCOUNTER — Ambulatory Visit: Payer: Medicare Other | Admitting: Orthopaedic Surgery

## 2017-12-30 ENCOUNTER — Ambulatory Visit (INDEPENDENT_AMBULATORY_CARE_PROVIDER_SITE_OTHER): Payer: Medicare Other | Admitting: Podiatry

## 2017-12-30 ENCOUNTER — Encounter: Payer: Self-pay | Admitting: Podiatry

## 2017-12-30 DIAGNOSIS — B351 Tinea unguium: Secondary | ICD-10-CM | POA: Diagnosis not present

## 2017-12-30 DIAGNOSIS — M79676 Pain in unspecified toe(s): Secondary | ICD-10-CM | POA: Diagnosis not present

## 2017-12-30 DIAGNOSIS — M79609 Pain in unspecified limb: Principal | ICD-10-CM

## 2017-12-31 ENCOUNTER — Ambulatory Visit: Payer: Medicare Other | Admitting: Podiatry

## 2017-12-31 ENCOUNTER — Ambulatory Visit (INDEPENDENT_AMBULATORY_CARE_PROVIDER_SITE_OTHER): Payer: Medicare Other | Admitting: Orthopaedic Surgery

## 2017-12-31 ENCOUNTER — Encounter: Payer: Self-pay | Admitting: Orthopaedic Surgery

## 2017-12-31 VITALS — BP 168/78 | HR 95 | Ht 66.0 in | Wt 256.0 lb

## 2017-12-31 DIAGNOSIS — S66912A Strain of unspecified muscle, fascia and tendon at wrist and hand level, left hand, initial encounter: Secondary | ICD-10-CM | POA: Diagnosis not present

## 2017-12-31 NOTE — Progress Notes (Signed)
CC:  My wrist does not hurt  She has full painless ROM of the left wrist, no swelling, no redness.  Encounter Diagnosis  Name Primary?  . Strain of left wrist, initial encounter Yes   Discharge.  Call if any problem.  Precautions discussed.   Electronically Signed Sanjuana Kava, MD 10/16/20192:03 PM

## 2018-01-01 ENCOUNTER — Ambulatory Visit: Payer: Medicare Other | Admitting: Orthopaedic Surgery

## 2018-01-07 NOTE — Progress Notes (Signed)
Subjective:   Patient ID: [name], female   DOB: 67 y.o.   MRN: [id number]   HPI Patient presents with significant thickness of nailbeds 1-5 both feet that are hard for her to cut and painful with shoe gear   ROS      Objective:  Physical Exam  Thick yellow brittle nailbeds 1-5 both feet that are painful when pressed from a dorsal direction and incurvated in the corners     Assessment:  Chronic mycotic nail infection with pain 1-5 both feet     Plan:  Debride painful nailbeds 1-5 both feet with no iatrogenic bleeding noted

## 2018-01-19 ENCOUNTER — Ambulatory Visit (HOSPITAL_COMMUNITY): Payer: Medicare Other

## 2018-02-02 ENCOUNTER — Inpatient Hospital Stay (HOSPITAL_COMMUNITY): Payer: Medicare Other | Attending: Oncology

## 2018-02-02 VITALS — BP 160/72 | HR 84 | Temp 98.2°F | Resp 18

## 2018-02-02 DIAGNOSIS — R918 Other nonspecific abnormal finding of lung field: Secondary | ICD-10-CM

## 2018-02-02 DIAGNOSIS — C7800 Secondary malignant neoplasm of unspecified lung: Secondary | ICD-10-CM

## 2018-02-02 DIAGNOSIS — E538 Deficiency of other specified B group vitamins: Secondary | ICD-10-CM | POA: Insufficient documentation

## 2018-02-02 DIAGNOSIS — C20 Malignant neoplasm of rectum: Secondary | ICD-10-CM

## 2018-02-02 MED ORDER — CYANOCOBALAMIN 1000 MCG/ML IJ SOLN
1000.0000 ug | Freq: Once | INTRAMUSCULAR | Status: AC
  Administered 2018-02-02: 1000 ug via INTRAMUSCULAR

## 2018-02-02 MED ORDER — HEPARIN SOD (PORK) LOCK FLUSH 100 UNIT/ML IV SOLN
500.0000 [IU] | Freq: Once | INTRAVENOUS | Status: AC
  Administered 2018-02-02: 500 [IU] via INTRAVENOUS

## 2018-02-02 MED ORDER — SODIUM CHLORIDE 0.9% FLUSH
10.0000 mL | Freq: Once | INTRAVENOUS | Status: AC
  Administered 2018-02-02: 10 mL via INTRAVENOUS

## 2018-02-02 NOTE — Progress Notes (Signed)
[name] presents today for port flush and B12 injection. VSS. Pt tolerated B12 injection and port flush without incident or complaint. Discharged in satisfactory condition in wheelchair in c/o care giver.

## 2018-02-02 NOTE — Patient Instructions (Signed)
[facility] _______________________________________________________________  Thank you for choosing [facility] at [facility] to provide your oncology and hematology care.  To afford each patient quality time with our providers, please arrive at least 15 minutes before your scheduled appointment.  You need to re-schedule your appointment if you arrive 10 or more minutes late.  We strive to give you quality time with our providers, and arriving late affects you and other patients whose appointments are after yours.  Also, if you no show three or more times for appointments you may be dismissed from the clinic.  Again, thank you for choosing [facility] at [facility]. Our hope is that these requests will allow you access to exceptional care and in a timely manner. _______________________________________________________________  If you have questions after your visit, please contact our office at [phone] between the hours of 8:30 a.m. and 5:00 p.m. Voicemails left after 4:30 p.m. will not be returned until the following business day. _______________________________________________________________  For prescription refill requests, have your pharmacy contact our office. _______________________________________________________________  Recommendations made by the consultant and any test results will be sent to your referring physician. _______________________________________________________________ 

## 2018-02-18 ENCOUNTER — Ambulatory Visit (HOSPITAL_COMMUNITY): Payer: Medicare Other

## 2018-03-04 ENCOUNTER — Encounter (HOSPITAL_COMMUNITY): Payer: Self-pay

## 2018-03-04 ENCOUNTER — Inpatient Hospital Stay (HOSPITAL_COMMUNITY): Payer: Medicare Other | Attending: Oncology

## 2018-03-04 VITALS — BP 143/56 | HR 76 | Temp 97.5°F | Resp 18

## 2018-03-04 DIAGNOSIS — E538 Deficiency of other specified B group vitamins: Secondary | ICD-10-CM | POA: Diagnosis present

## 2018-03-04 MED ORDER — CYANOCOBALAMIN 1000 MCG/ML IJ SOLN
1000.0000 ug | Freq: Once | INTRAMUSCULAR | Status: AC
  Administered 2018-03-04: 1000 ug via INTRAMUSCULAR
  Filled 2018-03-04: qty 1

## 2018-03-04 NOTE — Patient Instructions (Signed)
[facility] at [facility]  Discharge Instructions:   _______________________________________________________________  Thank you for choosing [facility] at [facility] to provide your oncology and hematology care.  To afford each patient quality time with our providers, please arrive at least 15 minutes before your scheduled appointment.  You need to re-schedule your appointment if you arrive 10 or more minutes late.  We strive to give you quality time with our providers, and arriving late affects you and other patients whose appointments are after yours.  Also, if you no show three or more times for appointments you may be dismissed from the clinic.  Again, thank you for choosing [facility] at [facility]. Our hope is that these requests will allow you access to exceptional care and in a timely manner. _______________________________________________________________  If you have questions after your visit, please contact our office at [phone] between the hours of 8:30 a.m. and 5:00 p.m. Voicemails left after 4:30 p.m. will not be returned until the following business day. _______________________________________________________________  For prescription refill requests, have your pharmacy contact our office. _______________________________________________________________  Recommendations made by the consultant and any test results will be sent to your referring physician. _______________________________________________________________ 

## 2018-03-04 NOTE — Progress Notes (Signed)
Patient tolerated injection with no complaints voiced. Site clean and dry with no bruising or swelling noted at site.  Band aid applied.  VSs with discharge and left with with caregiver with no s/s of distress noted.

## 2018-03-10 NOTE — Progress Notes (Signed)
This encounter was created in error - please disregard.

## 2018-03-20 ENCOUNTER — Other Ambulatory Visit (HOSPITAL_COMMUNITY): Payer: Medicare Other

## 2018-03-20 ENCOUNTER — Ambulatory Visit (HOSPITAL_COMMUNITY)
Admission: RE | Admit: 2018-03-20 | Discharge: 2018-03-20 | Disposition: A | Discharge status: Home / Self Care | Payer: Medicare Other | Source: Ambulatory Visit | Attending: Internal Medicine | Admitting: Internal Medicine

## 2018-03-20 DIAGNOSIS — R918 Other nonspecific abnormal finding of lung field: Secondary | ICD-10-CM | POA: Insufficient documentation

## 2018-03-23 ENCOUNTER — Ambulatory Visit (HOSPITAL_COMMUNITY): Payer: Medicare Other | Admitting: Hematology

## 2018-03-23 ENCOUNTER — Other Ambulatory Visit (HOSPITAL_COMMUNITY): Payer: Medicare Other

## 2018-03-23 ENCOUNTER — Ambulatory Visit (HOSPITAL_COMMUNITY): Payer: Medicare Other

## 2018-04-01 ENCOUNTER — Encounter: Payer: Self-pay | Admitting: Podiatry

## 2018-04-01 ENCOUNTER — Ambulatory Visit (INDEPENDENT_AMBULATORY_CARE_PROVIDER_SITE_OTHER): Payer: Medicare Other | Admitting: Podiatry

## 2018-04-01 DIAGNOSIS — E119 Type 2 diabetes mellitus without complications: Secondary | ICD-10-CM | POA: Diagnosis not present

## 2018-04-01 DIAGNOSIS — B351 Tinea unguium: Secondary | ICD-10-CM | POA: Diagnosis not present

## 2018-04-01 DIAGNOSIS — M79609 Pain in unspecified limb: Secondary | ICD-10-CM

## 2018-04-01 NOTE — Progress Notes (Addendum)
Complaint:  Visit Type: Patient returns to my office for continued preventative foot care services. Complaint: Patient states" my nails have grown long and thick and become painful to walk and wear shoes" Patient has been diagnosed with DM with no foot complications. The patient presents for preventative foot care services. No changes to ROS.  Patient had a layer of oil on toes  B/l Podiatric Exam: Vascular: dorsalis pedis and posterior tibial pulses are palpable bilateral. Capillary return is immediate. Temperature gradient is WNL. Skin turgor WNL  Sensorium: Diminished Semmes Weinstein monofilament test. Normal tactile sensation bilaterally. Nail Exam: Pt has thick disfigured discolored nails with subungual debris noted bilateral entire nail hallux through fifth toenails Ulcer Exam: There is no evidence of ulcer or pre-ulcerative changes or infection. Orthopedic Exam: Muscle tone and strength are WNL. No limitations in general ROM. No crepitus or effusions noted. Foot type and digits show no abnormalities. Bony prominences are unremarkable. Skin: No Porokeratosis. No infection or ulcers.    Diagnosis:  Onychomycosis, , Pain in right toe, pain in left toes  Treatment & Plan Procedures and Treatment: Consent by patient was obtained for treatment procedures.   Debridement of mycotic and hypertrophic toenails, 1 through 5 bilateral and clearing of subungual debris. No ulceration, no infection noted. Prescription for diabetic shoes given to this patient.   Return Visit-Office Procedure: Patient instructed to return to the office for a follow up visit 3 months for continued evaluation and treatment.    Gardiner Barefoot DPM

## 2018-04-02 ENCOUNTER — Telehealth: Payer: Self-pay | Admitting: Podiatry

## 2018-04-02 NOTE — Telephone Encounter (Signed)
[place] with [employer] is requesting notes from date of service  01 April 2018 to go with orders for diabetic shoes. Can fax to [phone].

## 2018-04-03 ENCOUNTER — Ambulatory Visit (HOSPITAL_COMMUNITY)
Admission: RE | Admit: 2018-04-03 | Discharge: 2018-04-03 | Disposition: A | Discharge status: Home / Self Care | Payer: Medicare Other | Source: Ambulatory Visit | Attending: Internal Medicine | Admitting: Internal Medicine

## 2018-04-03 ENCOUNTER — Inpatient Hospital Stay (HOSPITAL_COMMUNITY): Payer: Medicare Other | Attending: Internal Medicine

## 2018-04-03 DIAGNOSIS — C7800 Secondary malignant neoplasm of unspecified lung: Secondary | ICD-10-CM | POA: Insufficient documentation

## 2018-04-03 DIAGNOSIS — R918 Other nonspecific abnormal finding of lung field: Secondary | ICD-10-CM

## 2018-04-03 DIAGNOSIS — C20 Malignant neoplasm of rectum: Secondary | ICD-10-CM | POA: Insufficient documentation

## 2018-04-03 LAB — COMPREHENSIVE METABOLIC PANEL
ALBUMIN: 3.6 g/dL (ref 3.5–5.0)
ALT: 17 U/L (ref 0–44)
AST: 17 U/L (ref 15–41)
Alkaline Phosphatase: 322 U/L — ABNORMAL HIGH (ref 38–126)
Anion gap: 11 (ref 5–15)
BUN: 45 mg/dL — ABNORMAL HIGH (ref 8–23)
CHLORIDE: 99 mmol/L (ref 98–111)
CO2: 28 mmol/L (ref 22–32)
CREATININE: 1.41 mg/dL — AB (ref 0.44–1.00)
Calcium: 8.9 mg/dL (ref 8.9–10.3)
GFR calc non Af Amer: 39 mL/min — ABNORMAL LOW (ref >60)
GFR, EST AFRICAN AMERICAN: 45 mL/min — AB (ref >60)
GLUCOSE: 155 mg/dL — AB (ref 70–99)
Potassium: 4.5 mmol/L (ref 3.5–5.1)
SODIUM: 138 mmol/L (ref 135–145)
Total Bilirubin: 0.4 mg/dL (ref 0.3–1.2)
Total Protein: 7.1 g/dL (ref 6.5–8.1)

## 2018-04-03 LAB — CBC WITH DIFFERENTIAL/PLATELET
Abs Immature Granulocytes: 0.02 10*3/uL (ref 0.00–0.07)
BASOS ABS: 0 10*3/uL (ref 0.0–0.1)
Basophils Relative: 0 %
EOS PCT: 7 %
Eosinophils Absolute: 0.4 10*3/uL (ref 0.0–0.5)
HEMATOCRIT: 33.7 % — AB (ref 36.0–46.0)
HEMOGLOBIN: 10 g/dL — AB (ref 12.0–15.0)
IMMATURE GRANULOCYTES: 0 %
LYMPHS ABS: 0.6 10*3/uL — AB (ref 0.7–4.0)
LYMPHS PCT: 11 %
MCH: 27.5 pg (ref 26.0–34.0)
MCHC: 29.7 g/dL — AB (ref 30.0–36.0)
MCV: 92.6 fL (ref 80.0–100.0)
MONOS PCT: 8 %
Monocytes Absolute: 0.5 10*3/uL (ref 0.1–1.0)
NRBC: 0 % (ref 0.0–0.2)
Neutro Abs: 4.5 10*3/uL (ref 1.7–7.7)
Neutrophils Relative %: 74 %
Platelets: 197 10*3/uL (ref 150–400)
RBC: 3.64 MIL/uL — ABNORMAL LOW (ref 3.87–5.11)
RDW: 14.3 % (ref 11.5–15.5)
WBC: 6.1 10*3/uL (ref 4.0–10.5)

## 2018-04-03 MED ORDER — IOPAMIDOL (ISOVUE-300) INJECTION 61%
30.0000 mL | Freq: Once | INTRAVENOUS | Status: AC | PRN
  Administered 2018-04-03: 30 mL via ORAL

## 2018-04-03 MED ORDER — IOHEXOL 300 MG/ML  SOLN
100.0000 mL | Freq: Once | INTRAMUSCULAR | Status: AC | PRN
  Administered 2018-04-03: 100 mL via INTRAVENOUS

## 2018-04-04 LAB — CEA: CEA1: 5 ng/mL — AB (ref 0.0–4.7)

## 2018-04-06 ENCOUNTER — Ambulatory Visit (HOSPITAL_COMMUNITY): Payer: Medicare Other

## 2018-04-06 ENCOUNTER — Ambulatory Visit (HOSPITAL_COMMUNITY): Payer: Medicare Other | Admitting: Hematology

## 2018-04-06 NOTE — Telephone Encounter (Signed)
This is [place] with [employer]. I'm just calling to follow up on the request of notes for her diabetic shoes. Please fax those notes to [phone]

## 2018-05-05 ENCOUNTER — Encounter (HOSPITAL_COMMUNITY): Payer: Self-pay | Admitting: Internal Medicine

## 2018-05-05 ENCOUNTER — Inpatient Hospital Stay (HOSPITAL_BASED_OUTPATIENT_CLINIC_OR_DEPARTMENT_OTHER): Payer: Medicare Other | Admitting: Internal Medicine

## 2018-05-05 ENCOUNTER — Encounter (HOSPITAL_COMMUNITY): Payer: Self-pay | Admitting: Lab

## 2018-05-05 ENCOUNTER — Inpatient Hospital Stay (HOSPITAL_COMMUNITY): Payer: Medicare Other | Attending: Oncology

## 2018-05-05 VITALS — BP 158/64 | HR 81 | Temp 98.1°F | Resp 18 | Wt 250.7 lb

## 2018-05-05 DIAGNOSIS — C7800 Secondary malignant neoplasm of unspecified lung: Secondary | ICD-10-CM | POA: Insufficient documentation

## 2018-05-05 DIAGNOSIS — N189 Chronic kidney disease, unspecified: Secondary | ICD-10-CM | POA: Diagnosis not present

## 2018-05-05 DIAGNOSIS — Z933 Colostomy status: Secondary | ICD-10-CM | POA: Diagnosis not present

## 2018-05-05 DIAGNOSIS — E538 Deficiency of other specified B group vitamins: Secondary | ICD-10-CM | POA: Insufficient documentation

## 2018-05-05 DIAGNOSIS — C20 Malignant neoplasm of rectum: Secondary | ICD-10-CM | POA: Diagnosis not present

## 2018-05-05 DIAGNOSIS — R918 Other nonspecific abnormal finding of lung field: Secondary | ICD-10-CM | POA: Insufficient documentation

## 2018-05-05 MED ORDER — CYANOCOBALAMIN 1000 MCG/ML IJ SOLN
INTRAMUSCULAR | Status: AC
  Filled 2018-05-05: qty 1

## 2018-05-05 MED ORDER — CYANOCOBALAMIN 1000 MCG/ML IJ SOLN
1000.0000 ug | Freq: Once | INTRAMUSCULAR | Status: AC
  Administered 2018-05-05: 1000 ug via INTRAMUSCULAR

## 2018-05-05 NOTE — Progress Notes (Signed)
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) Staging form: Colon and Rectum, AJCC 7th Edition - Clinical stage from 02/04/2013: Stage IIA (T3, N0, M0) - Signed by Baird Cancer, PA-C on 01/09/2015 - Clinical stage from 10/26/2014: Stage IVA (T3, N0, M1a) - Signed by Baird Cancer, PA-C on 01/09/2015   Assessment and Plan:  1.  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  Pt was initially diagnosed in 02/2013; underwent pelvic radiation with Xeloda chemotherapy, followed by surgical resection with APR, followed by 6 months of adjuvant Xeloda therapy. Subsequent CT imaging revealed enlarging LLL pulmonary nodule. She underwent LLL resection with path confirming metastatic adenocarcinoma consistent with colon primary. Started chemotherapy with FOLFOX/Avastin in 12/2014. Chemotherapy course complicated by several treatment deferments for adverse side effects requiring dose reductions/discontinuations.    Pt is seen today for follow-up to go over scans and labs.  Labs done 04/03/2018 reviewed and showed WBC 6.1  Hb 10 plts 197,000.  Chemistries WNL with K+ 4.5 Cr 1.41 and normal LFTs.  CEA 5.    CT CAP done 04/03/2018 reviewed and showed IMPRESSION: 1. Stable exam. No new or progressive findings in the chest, abdomen, or pelvis. 2. Stable appearance of the 8 mm right upper lobe ground-glass nodule. 3. Stable appearance of diffuse sclerosis in the bony pelvis and thoracolumbar spine suggesting Paget's disease.  Pt will be set up for repeat CT chest in 09/2018 and will follow-up with labs  and scan results at that time.   Patient has remained on observation.  She is approaching 6 years out from diagnosis.   Patient has colostomy, so she should follow-up with Dr. Laural Golden of GI as directed.  2.  B12 deficiency.  She will continue monthly B12.    3.  Pulmonary nodules. CT CAP done 04/03/2018 reviewed and showed IMPRESSION: 1. Stable exam. No new or progressive findings in the chest, abdomen, or pelvis. 2. Stable appearance of the 8 mm right upper lobe ground-glass nodule. 3. Stable appearance of diffuse sclerosis in the bony pelvis and thoracolumbar spine suggesting Paget's disease.  Pt will be set up for repeat CT chest in 09/2018 for ongoing follow-up of lung lesion.  She will RTC in 09/2018 to go over results.    Patient has remained on observation.  She is approaching 6 years out from diagnosis.   4.  CKD.  Cr is 1.4.  Follow-up with PCP as directed.    5.  Health maintenance.  She should follow-up with GI as directed.  Follow-up with PCP and mammogram screenings as directed.  25 minutes spent with more than 50% spent in counseling and coordination of care and review of records.    Interval History:  Historical data obtained from the note dated 04/23/2017.  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  Pt was initially diagnosed in 02/2013; underwent pelvic radiation with Xeloda chemotherapy, followed by surgical resection with APR, followed by 6 months of adjuvant Xeloda therapy. Subsequent CT imaging revealed enlarging LLL pulmonary nodule. She underwent LLL resection with path confirming metastatic adenocarcinoma consistent with colon primary. Started chemotherapy with FOLFOX/Avastin in 12/2014. Chemotherapy course complicated by  several treatment deferments for adverse side effects requiring dose reductions/discontinuations.    Current Status: Caretaker present.  Patient is here for follow-up to go over labs and scans.    Rectal cancer  metastasized to lung ([place])   02/18/2013 Initial Diagnosis    Rectal cancer    03/25/2013 - 05/05/2013 Radiation Therapy    Pelvis treatment from 1/8- 2/12 with rectal boost from 2/13- 05/05/2013.    03/25/2013 - 05/05/2013 Chemotherapy    Xeloda 1500 mg BID 5 days/week with radiation therapy.    07/26/2013 Definitive Surgery    Dr. Arnoldo Morale- Flexible sigmoidoscopy, abdominoperineal resection, total abdominal hysterectomy with bilateral salpingo-oophorectomy    08/30/2013 - 03/01/2014 Chemotherapy    Xeloda 1000 mg BID 14 days on and 7 days off x 6 months    03/08/2014 Imaging    CT CAP- Bilateral pulmonary nodules, including an 8 mm left lower lobe nodule. Metastatic disease is a concern. The left lower lobe nodule has progressed since 03/05/2013, when it measured 5 mm.    05/26/2014 Imaging    CT Chest- Continued enlargement of the superior segment left lower lobe nodule. Malignancy is likely.   In contrast, the right lower lobe nodule is probably benign.    08/17/2014 Imaging    CT- Superior segment left lower lobe pulmonary nodule measures stable since the most recent comparison study, but is again noted to have increased in size when comparing to older studies. Continued close attention will be required as neoplasm remains a con    10/26/2014 Surgery    thoracoscopic left lower lobe superior segmentectomy. Pathology with metastatic adenocarcinoma c/w colonic primary    01/04/2015 - 06/27/2015 Chemotherapy    FOLFOX+Avastin (Avastin started on 01/18/2015).    01/09/2015 Imaging    CT CAP- Interval wedge resection of metastasis within the left lower lobe. No evidence of new metastatic disease within the chest, abdomen or pelvis.    02/15/2015 Treatment Plan Change    Treatment deferred due to renal function change (patient poor historian) with N/V.    02/22/2015 Treatment Plan Change    Added Aranesp at renal dosing to supportive therpay plan.    03/08/2015 Treatment Plan Change    Defer  treatment x 1 week    04/12/2015 Adverse Reaction    Patient reported mouth sores.  None on exam.      04/12/2015 Treatment Plan Change    5FU bolus is discontinued for "mouth sores"    04/12/2015 Adverse Reaction    Increasing Creatinine Cl.  CrCl calculated to be 30.7.      04/12/2015 Treatment Plan Change    Oxaliplatin dose reduced by 20% to 65 mg/m2 based upon creatinine clearance.    05/24/2015 Treatment Plan Change    5FU CI decreased by 15%.    08/29/2015 Imaging    CT CAP- Status post APR with left lower quadrant colostomy, and some stable presacral scarring. No definite evidence of metastatic disease in the CAP.  There is a new 3 mm nodule in the right lower lobe which is highly nonspecific. Attention on follow-up.    01/03/2016 Imaging    CT chest- No definite findings to suggest metastatic disease to the lungs. Previously noted 3 mm right lower lobe pulmonary nodule stable, favored to be benign.    08/22/2016 Imaging    CT CAP-1. Abdominoperineal resection with stable scarring and left lower quadrant colostomy. 2. 4 mm right lower lobe nodule, stable. 3. 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      Problem List Patient Active Problem List   Diagnosis Date Noted  . Vitamin B 12 deficiency [E53.8] 02/01/2015  . Lung nodule [R91.1] 10/26/2014  . Iron deficiency [E61.1] 04/27/2014  . Chronic renal disease, stage 3, moderately decreased glomerular filtration rate between 30-59 mL/min/1.73 square meter ([place]) [N18.3] 04/27/2014  . Incidental pulmonary nodule, greater than or equal to 4mm [R91.1] 04/26/2014  . Abdominal pain [R10.9] 04/26/2014  . Insomnia [G47.00] 04/26/2014  . 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 Helicobacter pylori gastritis [K29.70, B96.81] 02/08/2013  . Elevated liver enzymes [R74.8] 12/02/2012  . Anemia [D64.9] 08/29/2008  . ASTHMA, WITH ACUTE EXACERBATION [J45.901] 07/01/2008  . FLANK  PAIN, LEFT [R10.9] 05/27/2008  . SINUS TACHYCARDIA [I49.8] 03/02/2008  . INTERTRIGO, CANDIDAL [L53.8] 10/19/2007  . DIABETES MELLITUS, TYPE II, UNCONTROLLED [E11.65] 07/27/2007  . LIVER FUNCTION TESTS, ABNORMAL [R94.5] 04/28/2007  . UNSPECIFIED SLEEP DISTURBANCE [G47.9] 02/03/2007  . 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 HYPERLIPIDEMIA [E78.5] 11/03/2006  . GOUT NOS [M10.9] 11/03/2006  . DEPRESSION [F32.9] 11/03/2006  . RETARDATION, MENTAL NOS [F79] 11/03/2006  . Essential hypertension [I10] 11/03/2006  . ALLERGIC RHINITIS [J30.9] 11/03/2006  . ASTHMA [J45.909] 11/03/2006  . ARTHRITIS [M12.9] 11/03/2006  . SEIZURE DISORDER [R56.9] 11/03/2006  . MIGRAINES, HX OF [Z87.898] 11/03/2006    Past Medical History Past Medical History:  Diagnosis Date  . Arthritis   . Asthma   . Cancer (HCC)    Rectal   . Chronic renal disease, stage 3, moderately decreased glomerular filtration rate between 30-59 mL/min/1.73 square meter ([place]) 04/27/2014  . Depression   . Diabetes mellitus    years  . GERD (gastroesophageal reflux disease)   . Gout   . History of recurrent UTIs   . Hypertension   . Iron deficiency 04/27/2014  . Mental retardation    stopped school at 9th grade   . Numbness and tingling in hands    x several months   . Rectal cancer ([place])   . Seizures ([place])    more than 4 yrs since last seizure. UNknown etiology  . Shortness of breath    with exertion  . Vitamin B 12 deficiency 02/01/2015   Incidentally found without antibody testing.      Past Surgical History Past Surgical History:  Procedure Laterality Date  . ABDOMINAL HYSTERECTOMY    . ABDOMINAL PERINEAL BOWEL RESECTION N/A 07/26/2013   Procedure:  ABDOMINAL PERINEAL RESECTION;  Surgeon: Jamesetta So, MD;  Location: [facility];  Service: General;  Laterality: N/A;  . COLON SURGERY  2015   bowel resection/w colostomy  . COLONOSCOPY N/A 02/04/2013   Procedure: COLONOSCOPY;  Surgeon: Rogene Houston, MD;  [facility];  Service: Endoscopy;  Laterality: N/A;  225  . COLONOSCOPY N/A 05/12/2014   Procedure: COLONOSCOPY;  Surgeon: Rogene Houston, MD;  [facility];  Service: Endoscopy;  Laterality: N/A;  1030  . COLONOSCOPY WITH ESOPHAGOGASTRODUODENOSCOPY (EGD) N/A 01/14/2013   Procedure: COLONOSCOPY WITH ESOPHAGOGASTRODUODENOSCOPY (EGD);  Surgeon: Rogene Houston, MD;  [facility];  Service: Endoscopy;  Laterality: N/A;  250-moved to 315 Ann to notify pt  . COLOSTOMY Left 07/26/2013   Procedure: COLOSTOMY;  Surgeon: Jamesetta So, MD;  Location: [facility];  Service: General;  Laterality: Left;  . EUS N/A 02/18/2013   Procedure: LOWER ENDOSCOPIC ULTRASOUND (EUS);  Surgeon: Milus Banister, MD;  Location[facility];  Service: Endoscopy;  Laterality: N/A;  . FLEXIBLE SIGMOIDOSCOPY N/A 07/26/2013   Procedure: FLEXIBLE SIGMOIDOSCOPY;  Surgeon: Jamesetta So, MD;  Location: [facility];  Service: General;  Laterality: N/A;  . LYMPH NODE DISSECTION Left 10/26/2014   Procedure: LYMPH NODE DISSECTION;  Surgeon: [name], MD;  Location: [place];  Service: Thoracic;  Laterality: Left;  [name] MULTIPLE EXTRACTIONS WITH ALVEOLOPLASTY N/A 11/23/2012   Procedure: MULTIPLE EXTRACION 1, 2, 4, 5, 6, 7, 8, 9, 10, 11, 12, 13, 14, 17, 18, 20, 23, 24, 25, 26, 28, 29, 32 WITH ALVEOLOPLASTY, REMOVE BILATERAL TORI;  Surgeon: Gae Bon, DDS;  Location: [place];  Service: Oral Surgery;  Laterality: N/A;  . PORTACATH PLACEMENT Left 01/02/2015   Procedure: INSERTION PORT-A-CATH;  Surgeon: Aviva Signs, MD;  [facility];  Service: General;  Laterality: Left;  . SALPINGOOPHORECTOMY Bilateral 07/26/2013   Procedure: SALPINGO OOPHORECTOMY;  Surgeon: Jamesetta So, MD;  [facility];  Service: General;  Laterality: Bilateral;  . SEGMENTECOMY Left 10/26/2014   Procedure: LEFT LOWER LOBE SUPERIOR SEGMENTECTOMY;  Surgeon: [name], MD;  Location: [place];  Service: Thoracic;  Laterality: Left;   . SUPRACERVICAL ABDOMINAL HYSTERECTOMY N/A 07/26/2013   Procedure: HYSTERECTOMY SUPRACERVICAL ABDOMINAL ;  Surgeon: Jamesetta So, MD;  [facility];  Service: General;  Laterality: N/A;  . VIDEO ASSISTED THORACOSCOPY Left 10/26/2014   Procedure: LEFT VIDEO ASSISTED THORACOSCOPY;  Surgeon: [name], MD;  Location: [place];  Service: Thoracic;  Laterality: Left;    Family History Family History  Family history unknown: Yes     Social History  reports that she quit smoking about 9 years ago. Her smoking use included cigarettes. She has never used smokeless tobacco. She reports that she does not drink alcohol or use drugs.  Medications  Current Outpatient Medications:  .  ACCU-CHEK AVIVA PLUS test strip, , Disp: , Rfl:  .  aspirin EC 81 MG tablet, Take 81 mg by mouth daily., Disp: , Rfl:  .  calcium carbonate (TUMS) 500 MG chewable tablet, Chew 2 tablets (400 mg of elemental calcium total) by mouth 3 (three) times daily., Disp: 180 tablet, Rfl: 2 .  calcium-vitamin D (OSCAL WITH D) 500-200 MG-UNIT tablet, Take 2 tablets by mouth daily with breakfast., Disp: , Rfl:  .  docusate sodium (COLACE) 100 MG capsule, Take 100 mg by mouth 2 (two) times daily., Disp: , Rfl:  .  fish oil-omega-3 fatty acids 1000 MG capsule, Take 1 g by mouth 2 (two) times daily., Disp: , Rfl:  .  Fluticasone-Salmeterol (ADVAIR) 250-50 MCG/DOSE AEPB, Inhale 1 puff into the lungs every 12 (twelve) hours., Disp: , Rfl:  .  furosemide (LASIX) 40 MG tablet, , Disp: , Rfl:  .  metFORMIN (GLUCOPHAGE) 1000 MG tablet, , Disp: , Rfl:  .  metoprolol (TOPROL-XL) 200 MG 24 hr tablet, Take 200 mg by mouth every evening. , Disp: , Rfl:  .  montelukast (SINGULAIR) 10 MG tablet, Take 10 mg by mouth at bedtime., Disp: , Rfl:  .  OS-CAL CALCIUM + D3 500-200 MG-UNIT TABS, TAKE 2 TABLETS BY MOUTH DAILY WITH BREAKFAST. (Patient taking differently: Take 2 tablets by mouth daily with breakfast. ), Disp: 60 tablet, Rfl: 0 .   oxyCODONE-acetaminophen (PERCOCET) 5-325 MG tablet, Take 1 tablet by mouth every 8 (eight) hours as needed for severe pain., Disp: 10 tablet, Rfl: 0 .  oxyCODONE-acetaminophen (PERCOCET/ROXICET) 5-325 MG tablet, Take 1 tablet by mouth every 8 (eight) hours as  needed for severe pain., Disp: 6 tablet, Rfl: 0 .  pantoprazole (PROTONIX) 40 MG tablet, Take 40 mg by mouth every morning. , Disp: , Rfl:  .  phenytoin (DILANTIN) 100 MG ER capsule, Take 100 mg by mouth 2 (two) times daily., Disp: , Rfl:  .  pravastatin (PRAVACHOL) 40 MG tablet, Take 40 mg by mouth every evening. , Disp: , Rfl:  .  traZODone (DESYREL) 50 MG tablet, , Disp: , Rfl:   Allergies Patient has no known allergies.  Review of Systems Review of Systems - Oncology ROS negative   Physical Exam  Vitals Wt Readings from Last 3 Encounters:  05/05/18 250 lb 11.2 oz (113.7 kg)  12/31/17 256 lb (116.1 kg)  12/09/17 256 lb (116.1 kg)   Temp Readings from Last 3 Encounters:  05/05/18 98.1 F (36.7 C) (Oral)  03/04/18 (!) 97.5 F (36.4 C) (Oral)  02/02/18 98.2 F (36.8 C) (Oral)   BP Readings from Last 3 Encounters:  05/05/18 (!) 158/64  03/04/18 (!) 143/56  02/02/18 (!) 160/72   Pulse Readings from Last 3 Encounters:  05/05/18 81  03/04/18 76  02/02/18 84   Constitutional: Well-developed, well-nourished, and in no distress.   HENT: Head: Normocephalic and atraumatic.  Mouth/Throat: No oropharyngeal exudate. Mucosa moist. Eyes: Pupils are equal, round, and reactive to light. Conjunctivae are normal. No scleral icterus.  Neck: Normal range of motion. Neck supple. No JVD present.  Cardiovascular: Normal rate, regular rhythm and normal heart sounds.  Exam reveals no gallop and no friction rub.   No murmur heard. Pulmonary/Chest: Effort normal and breath sounds normal. No respiratory distress. No wheezes.No rales.  Abdominal: Soft. Bowel sounds are normal. No distension. There is no tenderness. There is no guarding.  Colostomy noted.   Musculoskeletal: No edema or tenderness.  Lymphadenopathy: No cervical, axillary or supraclavicular adenopathy.  Neurological: Alert and oriented to person, place, and time. No cranial nerve deficit.  Skin: Skin is warm and dry. No rash noted. No erythema. No pallor.  Psychiatric: Affect and judgment normal.   Labs No visits with results within 3 Day(s) from this visit.  Latest known visit with results is:  Lab on 04/03/2018  Component Date Value Ref Range Status  . CEA 04/03/2018 5.0* 0.0 - 4.7 ng/mL Final   Comment: (NOTE)                             Nonsmokers          <3.9                             Smokers             <5.6 [employer] Electrochemiluminescence Immunoassay (ECLIA) Values obtained with different assay methods or kits cannot be used interchangeably.  Results cannot be interpreted as absolute evidence of the presence or absence of malignant disease. Performed At: [facility] [place], [place] [id number] Rush Farmer MD [id number]   . Sodium 04/03/2018 138  135 - 145 mmol/L Final  . Potassium 04/03/2018 4.5  3.5 - 5.1 mmol/L Final  . Chloride 04/03/2018 99  98 - 111 mmol/L Final  . CO2 04/03/2018 28  22 - 32 mmol/L Final  . Glucose, Bld 04/03/2018 155* 70 - 99 mg/dL Final  . BUN 04/03/2018 45* 8 - 23 mg/dL Final  . Creatinine, Ser 04/03/2018 1.41* 0.44 -  1.00 mg/dL Final  . Calcium 04/03/2018 8.9  8.9 - 10.3 mg/dL Final  . Total Protein 04/03/2018 7.1  6.5 - 8.1 g/dL Final  . Albumin 04/03/2018 3.6  3.5 - 5.0 g/dL Final  . AST 04/03/2018 17  15 - 41 U/L Final  . ALT 04/03/2018 17  0 - 44 U/L Final  . Alkaline Phosphatase 04/03/2018 322* 38 - 126 U/L Final  . Total Bilirubin 04/03/2018 0.4  0.3 - 1.2 mg/dL Final  . GFR calc non Af Amer 04/03/2018 39* >60 mL/min Final  . GFR calc Af Amer 04/03/2018 45* >60 mL/min Final  . Anion gap 04/03/2018 11  5 - 15 Final   Performed at [facility], [address], [place], [place] [address]  . WBC 04/03/2018 6.1  4.0 - 10.5 K/uL Final  . RBC 04/03/2018 3.64* 3.87 - 5.11 MIL/uL Final  . Hemoglobin 04/03/2018 10.0* 12.0 - 15.0 g/dL Final  . HCT 04/03/2018 33.7* 36.0 - 46.0 % Final  . MCV 04/03/2018 92.6  80.0 - 100.0 fL Final  . MCH 04/03/2018 27.5  26.0 - 34.0 pg Final  . MCHC 04/03/2018 29.7* 30.0 - 36.0 g/dL Final  . RDW 04/03/2018 14.3  11.5 - 15.5 % Final  . Platelets 04/03/2018 197  150 - 400 K/uL Final  . nRBC 04/03/2018 0.0  0.0 - 0.2 % Final  . Neutrophils Relative % 04/03/2018 74  % Final  . Neutro Abs 04/03/2018 4.5  1.7 - 7.7 K/uL Final  . Lymphocytes Relative 04/03/2018 11  % Final  . Lymphs Abs 04/03/2018 0.6* 0.7 - 4.0 K/uL Final  . Monocytes Relative 04/03/2018 8  % Final  . Monocytes Absolute 04/03/2018 0.5  0.1 - 1.0 K/uL Final  . Eosinophils Relative 04/03/2018 7  % Final  . Eosinophils Absolute 04/03/2018 0.4  0.0 - 0.5 K/uL Final  . Basophils Relative 04/03/2018 0  % Final  . Basophils Absolute 04/03/2018 0.0  0.0 - 0.1 K/uL Final  . Immature Granulocytes 04/03/2018 0  % Final  . Abs Immature Granulocytes 04/03/2018 0.02  0.00 - 0.07 K/uL Final   Performed at [facility], [address] [place],  [address]     Pathology Orders Placed This Encounter  Procedures  . CT CHEST W CONTRAST    Standing Status:   Future    Standing Expiration Date:   05/05/2019    Order Specific Question:   If indicated for the ordered procedure, I authorize the administration of contrast media per Radiology protocol    Answer:   Yes    Order Specific Question:   Preferred imaging location?    Answer:   [facility]    Order Specific Question:   Radiology Contrast Protocol - do NOT remove file path    Answer:   [email or web address]  . CBC with Differential    Standing Status:   Future    Standing Expiration Date:   05/06/2019  . Comprehensive metabolic panel    Standing Status:   Future    Standing  Expiration Date:   05/06/2019  . Lactate dehydrogenase    Standing Status:   Future    Standing Expiration Date:   05/06/2019  . CEA    Standing Status:   Future    Standing Expiration Date:   05/06/2019  . SCHEDULING COMMUNICATION INJECTION    Schedule 15 minute injection appointment       Zoila Shutter MD

## 2018-05-05 NOTE — Progress Notes (Unsigned)
Referral sent to Dr Lowanda Foster. Records faxed on 2/18

## 2018-05-05 NOTE — Progress Notes (Signed)
[name] presents today for injection per MD orders. B12  administered IM in left deltoid. Administration without incident. Patient tolerated well.

## 2018-05-20 ENCOUNTER — Ambulatory Visit (INDEPENDENT_AMBULATORY_CARE_PROVIDER_SITE_OTHER): Payer: Medicare Other | Admitting: Internal Medicine

## 2018-06-03 ENCOUNTER — Inpatient Hospital Stay (HOSPITAL_COMMUNITY): Payer: Medicare Other | Attending: Oncology

## 2018-06-03 ENCOUNTER — Ambulatory Visit (HOSPITAL_COMMUNITY): Payer: Medicare Other

## 2018-06-03 ENCOUNTER — Other Ambulatory Visit: Payer: Self-pay

## 2018-06-03 ENCOUNTER — Encounter (HOSPITAL_COMMUNITY): Payer: Self-pay

## 2018-06-03 ENCOUNTER — Other Ambulatory Visit (HOSPITAL_COMMUNITY): Payer: Self-pay | Admitting: Pulmonary Disease

## 2018-06-03 ENCOUNTER — Ambulatory Visit (HOSPITAL_COMMUNITY)
Admission: RE | Admit: 2018-06-03 | Discharge: 2018-06-03 | Disposition: A | Discharge status: Home / Self Care | Payer: Medicare Other | Source: Ambulatory Visit | Attending: Pulmonary Disease | Admitting: Pulmonary Disease

## 2018-06-03 VITALS — BP 161/60 | HR 75 | Temp 98.4°F | Resp 18

## 2018-06-03 DIAGNOSIS — E538 Deficiency of other specified B group vitamins: Secondary | ICD-10-CM | POA: Insufficient documentation

## 2018-06-03 DIAGNOSIS — R05 Cough: Secondary | ICD-10-CM | POA: Insufficient documentation

## 2018-06-03 DIAGNOSIS — R059 Cough, unspecified: Secondary | ICD-10-CM

## 2018-06-03 MED ORDER — CYANOCOBALAMIN 1000 MCG/ML IJ SOLN
INTRAMUSCULAR | Status: AC
  Filled 2018-06-03: qty 1

## 2018-06-03 MED ORDER — CYANOCOBALAMIN 1000 MCG/ML IJ SOLN
1000.0000 ug | Freq: Once | INTRAMUSCULAR | Status: AC
  Administered 2018-06-03: 1000 ug via INTRAMUSCULAR

## 2018-06-03 NOTE — Patient Instructions (Signed)
[facility] at [facility]  Discharge Instructions:   _______________________________________________________________  Thank you for choosing [facility] at [facility] to provide your oncology and hematology care.  To afford each patient quality time with our providers, please arrive at least 15 minutes before your scheduled appointment.  You need to re-schedule your appointment if you arrive 10 or more minutes late.  We strive to give you quality time with our providers, and arriving late affects you and other patients whose appointments are after yours.  Also, if you no show three or more times for appointments you may be dismissed from the clinic.  Again, thank you for choosing [facility] at [facility]. Our hope is that these requests will allow you access to exceptional care and in a timely manner. _______________________________________________________________  If you have questions after your visit, please contact our office at [phone] between the hours of 8:30 a.m. and 5:00 p.m. Voicemails left after 4:30 p.m. will not be returned until the following business day. _______________________________________________________________  For prescription refill requests, have your pharmacy contact our office. _______________________________________________________________  Recommendations made by the consultant and any test results will be sent to your referring physician. _______________________________________________________________ 

## 2018-06-03 NOTE — Progress Notes (Signed)
[name] presents today for injection per MD orders. B12  administered IM  in right deltoid. Administration without incident. Patient tolerated well.   Vital signs stable. No complaints at this time. Discharged from clinic via wheel chair. F/U with [facility] as scheduled.

## 2018-07-06 ENCOUNTER — Ambulatory Visit (HOSPITAL_COMMUNITY): Payer: Medicare Other

## 2018-07-07 ENCOUNTER — Ambulatory Visit (HOSPITAL_COMMUNITY): Payer: Medicare Other

## 2018-07-08 ENCOUNTER — Ambulatory Visit: Payer: Medicare Other | Admitting: Podiatry

## 2018-08-05 ENCOUNTER — Inpatient Hospital Stay (HOSPITAL_COMMUNITY): Payer: Medicare Other | Attending: Oncology

## 2018-08-05 ENCOUNTER — Other Ambulatory Visit: Payer: Self-pay

## 2018-08-05 VITALS — BP 160/60 | HR 75 | Temp 98.2°F | Resp 18

## 2018-08-05 DIAGNOSIS — E538 Deficiency of other specified B group vitamins: Secondary | ICD-10-CM | POA: Insufficient documentation

## 2018-08-05 MED ORDER — CYANOCOBALAMIN 1000 MCG/ML IJ SOLN
INTRAMUSCULAR | Status: AC
  Filled 2018-08-05: qty 1

## 2018-08-05 MED ORDER — CYANOCOBALAMIN 1000 MCG/ML IJ SOLN
1000.0000 ug | Freq: Once | INTRAMUSCULAR | Status: AC
  Administered 2018-08-05: 1000 ug via INTRAMUSCULAR

## 2018-08-05 NOTE — Progress Notes (Signed)
[name] presents today for injection per MD orders. B12  administered IM in left Deltoid. Administration without incident. Patient tolerated well. [name]

## 2018-08-26 ENCOUNTER — Ambulatory Visit: Payer: Medicare Other | Admitting: Podiatry

## 2018-08-27 ENCOUNTER — Ambulatory Visit (INDEPENDENT_AMBULATORY_CARE_PROVIDER_SITE_OTHER): Payer: Medicare Other | Admitting: Podiatry

## 2018-08-27 ENCOUNTER — Encounter: Payer: Self-pay | Admitting: Podiatry

## 2018-08-27 ENCOUNTER — Other Ambulatory Visit: Payer: Self-pay

## 2018-08-27 DIAGNOSIS — M79674 Pain in right toe(s): Secondary | ICD-10-CM | POA: Diagnosis not present

## 2018-08-27 DIAGNOSIS — B351 Tinea unguium: Secondary | ICD-10-CM

## 2018-08-27 DIAGNOSIS — E1140 Type 2 diabetes mellitus with diabetic neuropathy, unspecified: Secondary | ICD-10-CM | POA: Insufficient documentation

## 2018-08-27 DIAGNOSIS — M79675 Pain in left toe(s): Secondary | ICD-10-CM | POA: Diagnosis not present

## 2018-08-27 DIAGNOSIS — E1142 Type 2 diabetes mellitus with diabetic polyneuropathy: Secondary | ICD-10-CM | POA: Diagnosis not present

## 2018-09-07 ENCOUNTER — Ambulatory Visit (HOSPITAL_COMMUNITY): Payer: Medicare Other

## 2018-09-16 ENCOUNTER — Other Ambulatory Visit: Payer: Self-pay

## 2018-09-16 ENCOUNTER — Encounter (HOSPITAL_COMMUNITY): Payer: Self-pay

## 2018-09-16 ENCOUNTER — Inpatient Hospital Stay (HOSPITAL_COMMUNITY): Payer: Medicare Other | Attending: Oncology

## 2018-09-16 ENCOUNTER — Ambulatory Visit (HOSPITAL_COMMUNITY): Payer: Medicare Other

## 2018-09-16 VITALS — BP 153/43 | HR 89 | Temp 97.1°F | Resp 18

## 2018-09-16 DIAGNOSIS — E538 Deficiency of other specified B group vitamins: Secondary | ICD-10-CM

## 2018-09-16 MED ORDER — CYANOCOBALAMIN 1000 MCG/ML IJ SOLN
1000.0000 ug | Freq: Once | INTRAMUSCULAR | Status: AC
  Administered 2018-09-16: 1000 ug via INTRAMUSCULAR
  Filled 2018-09-16: qty 1

## 2018-09-16 NOTE — Progress Notes (Signed)
[name] tolerated Vit B12 injection well without complaints or incident. VSS Pt discharged via wheelchair in satisfactory condition

## 2018-09-16 NOTE — Patient Instructions (Signed)
[facility] at [facility] Discharge Instructions  Received Vit B12 injection today. Follow-up as scheduled. Call clinic for any questions or concerns   Thank you for choosing [facility] at [facility] to provide your oncology and hematology care.  To afford each patient quality time with our provider, please arrive at least 15 minutes before your scheduled appointment time.   If you have a lab appointment with the Cancer Center please come in thru the  Main Entrance and check in at the main information desk  You need to re-schedule your appointment should you arrive 10 or more minutes late.  We strive to give you quality time with our providers, and arriving late affects you and other patients whose appointments are after yours.  Also, if you no show three or more times for appointments you may be dismissed from the clinic at the providers discretion.     Again, thank you for choosing [facility].  Our hope is that these requests will decrease the amount of time that you wait before being seen by our physicians.       _____________________________________________________________  Should you have questions after your visit to [facility], please contact our office at [phone] between the hours of 8:00 a.m. and 4:30 p.m.  Voicemails left after 4:00 p.m. will not be returned until the following business day.  For prescription refill requests, have your pharmacy contact our office and allow 72 hours.    Cancer Center Support Programs:   > Cancer Support Group  2nd Tuesday of the month 1pm-2pm, Journey Room   

## 2018-10-07 ENCOUNTER — Ambulatory Visit (HOSPITAL_COMMUNITY): Payer: Medicare Other

## 2018-10-20 ENCOUNTER — Other Ambulatory Visit: Payer: Self-pay

## 2018-10-20 ENCOUNTER — Inpatient Hospital Stay (HOSPITAL_COMMUNITY): Payer: Medicare Other | Attending: Oncology

## 2018-10-20 VITALS — BP 142/64 | HR 78 | Temp 97.9°F | Resp 20

## 2018-10-20 DIAGNOSIS — C20 Malignant neoplasm of rectum: Secondary | ICD-10-CM | POA: Insufficient documentation

## 2018-10-20 DIAGNOSIS — E538 Deficiency of other specified B group vitamins: Secondary | ICD-10-CM | POA: Diagnosis not present

## 2018-10-20 DIAGNOSIS — Z933 Colostomy status: Secondary | ICD-10-CM | POA: Diagnosis not present

## 2018-10-20 DIAGNOSIS — C7800 Secondary malignant neoplasm of unspecified lung: Secondary | ICD-10-CM | POA: Diagnosis not present

## 2018-10-20 DIAGNOSIS — R918 Other nonspecific abnormal finding of lung field: Secondary | ICD-10-CM | POA: Diagnosis not present

## 2018-10-20 MED ORDER — CYANOCOBALAMIN 1000 MCG/ML IJ SOLN
1000.0000 ug | Freq: Once | INTRAMUSCULAR | Status: AC
  Administered 2018-10-20: 1000 ug via INTRAMUSCULAR
  Filled 2018-10-20: qty 1

## 2018-10-20 NOTE — Patient Instructions (Signed)
[place] [facility]  Discharge Instructions:  B12 injection received today.  Cyanocobalamin, Vitamin B12 injection What is this medicine? CYANOCOBALAMIN (sye an oh koe BAL a min) is a man made form of vitamin B12. Vitamin B12 is used in the growth of healthy blood cells, nerve cells, and proteins in the body. It also helps with the metabolism of fats and carbohydrates. This medicine is used to treat people who can not absorb vitamin B12. This medicine may be used for other purposes; ask your health care provider or pharmacist if you have questions. COMMON BRAND NAME(S): B-12 Compliance Kit, B-12 Injection Kit, Cyomin, LA-12, Nutri-Twelve, Physicians EZ Use B-12, Primabalt What should I tell my health care provider before I take this medicine? They need to know if you have any of these conditions:  kidney disease  Leber's disease  megaloblastic anemia  an unusual or allergic reaction to cyanocobalamin, cobalt, other medicines, foods, dyes, or preservatives  pregnant or trying to get pregnant  breast-feeding How should I use this medicine? This medicine is injected into a muscle or deeply under the skin. It is usually given by a health care professional in a clinic or doctor's office. However, your doctor may teach you how to inject yourself. Follow all instructions. Talk to your pediatrician regarding the use of this medicine in children. Special care may be needed. Overdosage: If you think you have taken too much of this medicine contact a poison control center or emergency room at once. NOTE: This medicine is only for you. Do not share this medicine with others. What if I miss a dose? If you are given your dose at a clinic or doctor's office, call to reschedule your appointment. If you give your own injections and you miss a dose, take it as soon as you can. If it is almost time for your next dose, take only that dose. Do not take double or extra  doses. What may interact with this medicine?  colchicine  heavy alcohol intake This list may not describe all possible interactions. Give your health care provider a list of all the medicines, herbs, non-prescription drugs, or dietary supplements you use. Also tell them if you smoke, drink alcohol, or use illegal drugs. Some items may interact with your medicine. What should I watch for while using this medicine? Visit your doctor or health care professional regularly. You may need blood work done while you are taking this medicine. You may need to follow a special diet. Talk to your doctor. Limit your alcohol intake and avoid smoking to get the best benefit. What side effects may I notice from receiving this medicine? Side effects that you should report to your doctor or health care professional as soon as possible:  allergic reactions like skin rash, itching or hives, swelling of the face, lips, or tongue  blue tint to skin  chest tightness, pain  difficulty breathing, wheezing  dizziness  red, swollen painful area on the leg Side effects that usually do not require medical attention (report to your doctor or health care professional if they continue or are bothersome):  diarrhea  headache This list may not describe all possible side effects. Call your doctor for medical advice about side effects. You may report side effects to FDA at 1-800-FDA-1088. Where should I keep my medicine? Keep out of the reach of children. Store at room temperature between 15 and 30 degrees C (59 and 85 degrees F). Protect from light. Throw away  any unused medicine after the expiration date. NOTE: This sheet is a summary. It may not cover all possible information. If you have questions about this medicine, talk to your doctor, pharmacist, or health care provider.  2020 Elsevier/Gold Standard (2007-06-15 22:10:20)  _______________________________________________________________  Thank you for  choosing [place] at [facility] to provide your oncology and hematology care.  To afford each patient quality time with our providers, please arrive at least 15 minutes before your scheduled appointment.  You need to re-schedule your appointment if you arrive 10 or more minutes late.  We strive to give you quality time with our providers, and arriving late affects you and other patients whose appointments are after yours.  Also, if you no show three or more times for appointments you may be dismissed from the clinic.  Again, thank you for choosing [place] [facility] [place] hope is that these requests will allow you access to exceptional care and in a timely manner. _______________________________________________________________  If you have questions after your visit, please contact our office at [phone] between the hours of 8:30 a.m. and 5:00 p.m. Voicemails left after 4:30 p.m. will not be returned until the following business day. _______________________________________________________________  For prescription refill requests, have your pharmacy contact our office. _______________________________________________________________  Recommendations made by the consultant and any test results will be sent to your referring physician. _______________________________________________________________

## 2018-10-20 NOTE — Progress Notes (Signed)
[name] presents today for B12 injection. VSS. Injection tolerated without incident or complaint. See MAR for details. Patient discharged in satisfactory condition with follow up instructions.

## 2018-11-03 ENCOUNTER — Other Ambulatory Visit (HOSPITAL_COMMUNITY): Payer: Self-pay | Admitting: *Deleted

## 2018-11-03 DIAGNOSIS — C20 Malignant neoplasm of rectum: Secondary | ICD-10-CM

## 2018-11-03 DIAGNOSIS — E538 Deficiency of other specified B group vitamins: Secondary | ICD-10-CM

## 2018-11-03 DIAGNOSIS — C7800 Secondary malignant neoplasm of unspecified lung: Secondary | ICD-10-CM

## 2018-11-03 DIAGNOSIS — R918 Other nonspecific abnormal finding of lung field: Secondary | ICD-10-CM

## 2018-11-04 ENCOUNTER — Ambulatory Visit (HOSPITAL_COMMUNITY)
Admission: RE | Admit: 2018-11-04 | Discharge: 2018-11-04 | Disposition: A | Discharge status: Home / Self Care | Payer: Medicare Other | Source: Ambulatory Visit | Attending: Internal Medicine | Admitting: Internal Medicine

## 2018-11-04 ENCOUNTER — Other Ambulatory Visit: Payer: Self-pay

## 2018-11-04 ENCOUNTER — Inpatient Hospital Stay (HOSPITAL_COMMUNITY): Payer: Medicare Other

## 2018-11-04 DIAGNOSIS — C20 Malignant neoplasm of rectum: Secondary | ICD-10-CM | POA: Diagnosis not present

## 2018-11-04 DIAGNOSIS — E538 Deficiency of other specified B group vitamins: Secondary | ICD-10-CM

## 2018-11-04 DIAGNOSIS — R918 Other nonspecific abnormal finding of lung field: Secondary | ICD-10-CM

## 2018-11-04 LAB — COMPREHENSIVE METABOLIC PANEL WITH GFR
ALT: 16 U/L (ref 0–44)
AST: 17 U/L (ref 15–41)
Albumin: 4.1 g/dL (ref 3.5–5.0)
Alkaline Phosphatase: 487 U/L — ABNORMAL HIGH (ref 38–126)
Anion gap: 12 (ref 5–15)
BUN: 38 mg/dL — ABNORMAL HIGH (ref 8–23)
CO2: 26 mmol/L (ref 22–32)
Calcium: 9.1 mg/dL (ref 8.9–10.3)
Chloride: 99 mmol/L (ref 98–111)
Creatinine, Ser: 1.32 mg/dL — ABNORMAL HIGH (ref 0.44–1.00)
GFR calc Af Amer: 48 mL/min — ABNORMAL LOW
GFR calc non Af Amer: 42 mL/min — ABNORMAL LOW
Glucose, Bld: 97 mg/dL (ref 70–99)
Potassium: 4.7 mmol/L (ref 3.5–5.1)
Sodium: 137 mmol/L (ref 135–145)
Total Bilirubin: 0.7 mg/dL (ref 0.3–1.2)
Total Protein: 7.6 g/dL (ref 6.5–8.1)

## 2018-11-04 LAB — CBC WITH DIFFERENTIAL/PLATELET
Abs Immature Granulocytes: 0.02 10*3/uL (ref 0.00–0.07)
Basophils Absolute: 0 10*3/uL (ref 0.0–0.1)
Basophils Relative: 0 %
Eosinophils Absolute: 0.4 10*3/uL (ref 0.0–0.5)
Eosinophils Relative: 6 %
HCT: 35.7 % — ABNORMAL LOW (ref 36.0–46.0)
Hemoglobin: 10.8 g/dL — ABNORMAL LOW (ref 12.0–15.0)
Immature Granulocytes: 0 %
Lymphocytes Relative: 13 %
Lymphs Abs: 0.8 10*3/uL (ref 0.7–4.0)
MCH: 27.3 pg (ref 26.0–34.0)
MCHC: 30.3 g/dL (ref 30.0–36.0)
MCV: 90.4 fL (ref 80.0–100.0)
Monocytes Absolute: 0.5 10*3/uL (ref 0.1–1.0)
Monocytes Relative: 9 %
Neutro Abs: 4.2 10*3/uL (ref 1.7–7.7)
Neutrophils Relative %: 72 %
Platelets: 169 10*3/uL (ref 150–400)
RBC: 3.95 MIL/uL (ref 3.87–5.11)
RDW: 14.2 % (ref 11.5–15.5)
WBC: 5.9 10*3/uL (ref 4.0–10.5)
nRBC: 0 % (ref 0.0–0.2)

## 2018-11-04 LAB — LACTATE DEHYDROGENASE: LDH: 168 U/L (ref 98–192)

## 2018-11-04 MED ORDER — IOHEXOL 300 MG/ML  SOLN
75.0000 mL | Freq: Once | INTRAMUSCULAR | Status: AC | PRN
  Administered 2018-11-04: 13:00:00 75 mL via INTRAVENOUS

## 2018-11-05 ENCOUNTER — Other Ambulatory Visit (HOSPITAL_COMMUNITY): Payer: Medicare Other

## 2018-11-05 LAB — CEA: CEA: 5.6 ng/mL — ABNORMAL HIGH (ref 0.0–4.7)

## 2018-11-09 ENCOUNTER — Ambulatory Visit (HOSPITAL_COMMUNITY): Payer: Medicare Other

## 2018-11-09 ENCOUNTER — Ambulatory Visit (HOSPITAL_COMMUNITY): Payer: Medicare Other | Admitting: Hematology

## 2018-11-18 ENCOUNTER — Ambulatory Visit (HOSPITAL_COMMUNITY): Payer: Medicare Other | Admitting: Hematology

## 2018-11-18 ENCOUNTER — Ambulatory Visit (HOSPITAL_COMMUNITY): Payer: Medicare Other

## 2018-12-01 ENCOUNTER — Inpatient Hospital Stay (HOSPITAL_COMMUNITY): Payer: Medicare Other | Attending: Oncology

## 2018-12-01 ENCOUNTER — Inpatient Hospital Stay (HOSPITAL_BASED_OUTPATIENT_CLINIC_OR_DEPARTMENT_OTHER): Payer: Medicare Other | Admitting: Hematology

## 2018-12-01 ENCOUNTER — Other Ambulatory Visit: Payer: Self-pay

## 2018-12-01 DIAGNOSIS — C7800 Secondary malignant neoplasm of unspecified lung: Secondary | ICD-10-CM

## 2018-12-01 DIAGNOSIS — E538 Deficiency of other specified B group vitamins: Secondary | ICD-10-CM | POA: Diagnosis not present

## 2018-12-01 DIAGNOSIS — C20 Malignant neoplasm of rectum: Secondary | ICD-10-CM | POA: Diagnosis not present

## 2018-12-01 MED ORDER — CYANOCOBALAMIN 1000 MCG/ML IJ SOLN
1000.0000 ug | Freq: Once | INTRAMUSCULAR | Status: AC
  Administered 2018-12-01: 1000 ug via INTRAMUSCULAR
  Filled 2018-12-01: qty 1

## 2018-12-01 NOTE — Progress Notes (Signed)
Patient tolerated injection with no complaints voiced.  Site clean and dry with no bruising or swelling noted at site.  Band aid applied.  Vss with discharge and left ambulatory with no s/s of distress noted.  

## 2018-12-03 ENCOUNTER — Encounter: Payer: Self-pay | Admitting: Podiatry

## 2018-12-03 ENCOUNTER — Other Ambulatory Visit: Payer: Self-pay

## 2018-12-03 ENCOUNTER — Ambulatory Visit (INDEPENDENT_AMBULATORY_CARE_PROVIDER_SITE_OTHER): Payer: Medicare Other | Admitting: Podiatry

## 2018-12-03 DIAGNOSIS — M79675 Pain in left toe(s): Secondary | ICD-10-CM | POA: Diagnosis not present

## 2018-12-03 DIAGNOSIS — E1142 Type 2 diabetes mellitus with diabetic polyneuropathy: Secondary | ICD-10-CM

## 2018-12-03 DIAGNOSIS — M79674 Pain in right toe(s): Secondary | ICD-10-CM | POA: Diagnosis not present

## 2018-12-03 DIAGNOSIS — B351 Tinea unguium: Secondary | ICD-10-CM

## 2018-12-03 NOTE — Progress Notes (Signed)
Complaint:  Visit Type: Patient returns to my office for continued preventative foot care services. Complaint: Patient states" my nails have grown long and thick and become painful to walk and wear shoes" Patient has been diagnosed with DM with no foot complications. The patient presents for preventative foot care services. No changes to ROS.   Podiatric Exam: Vascular: dorsalis pedis and posterior tibial pulses are palpable bilateral. Capillary return is immediate. Temperature gradient is WNL. Skin turgor WNL  Sensorium: Diminished Semmes Weinstein monofilament test. Normal tactile sensation bilaterally. Nail Exam: Pt has thick disfigured discolored nails with subungual debris noted bilateral entire nail hallux through fifth toenails Ulcer Exam: There is no evidence of ulcer or pre-ulcerative changes or infection. Orthopedic Exam: Muscle tone and strength are WNL. No limitations in general ROM. No crepitus or effusions noted. Foot type and digits show no abnormalities. Bony prominences are unremarkable. Skin: No Porokeratosis. No infection or ulcers.    Diagnosis:  Onychomycosis, , Pain in right toe, pain in left toes  Treatment & Plan Procedures and Treatment: Consent by patient was obtained for treatment procedures.   Debridement of mycotic and hypertrophic toenails, 1 through 5 bilateral and clearing of subungual debris. No ulceration, no infection noted. Prescription for diabetic shoes given to this patient.   Return Visit-Office Procedure: Patient instructed to return to the office for a follow up visit 4 months for continued evaluation and treatment.    Gardiner Barefoot DPM

## 2018-12-30 ENCOUNTER — Inpatient Hospital Stay (HOSPITAL_COMMUNITY): Payer: Medicare Other | Attending: Oncology

## 2018-12-30 ENCOUNTER — Encounter (HOSPITAL_COMMUNITY): Payer: Self-pay

## 2018-12-30 ENCOUNTER — Other Ambulatory Visit: Payer: Self-pay

## 2018-12-30 VITALS — BP 150/76 | HR 78 | Temp 97.7°F | Resp 18

## 2018-12-30 DIAGNOSIS — C20 Malignant neoplasm of rectum: Secondary | ICD-10-CM

## 2018-12-30 DIAGNOSIS — E538 Deficiency of other specified B group vitamins: Secondary | ICD-10-CM | POA: Diagnosis not present

## 2018-12-30 MED ORDER — CYANOCOBALAMIN 1000 MCG/ML IJ SOLN
1000.0000 ug | Freq: Once | INTRAMUSCULAR | Status: AC
  Administered 2018-12-30: 1000 ug via INTRAMUSCULAR
  Filled 2018-12-30: qty 1

## 2018-12-30 NOTE — Progress Notes (Signed)
Patient tolerated injection with no complaints voiced.  Site clean and dry with no bruising or swelling noted at site.  Band aid applied.  Vss with discharge and left by wheelchair with no s/s of distress noted.  

## 2019-01-27 NOTE — Progress Notes (Signed)
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   03/25/2013 - 05/05/2013 Chemotherapy   Xeloda 1500 mg BID 5 days/week with radiation therapy.   07/26/2013 Definitive Surgery   Dr. Arnoldo Morale- Flexible sigmoidoscopy, abdominoperineal resection, total abdominal hysterectomy with bilateral salpingo-oophorectomy   08/30/2013 - 03/01/2014 Chemotherapy   Xeloda 1000 mg BID 14 days on and 7 days off x 6 months   03/08/2014 Imaging   CT CAP- Bilateral pulmonary nodules, including an 8 mm left lower lobe nodule. Metastatic disease is a concern. The left lower lobe nodule has progressed since 03/05/2013, when it measured 5 mm.   05/26/2014 Imaging   CT Chest- Continued enlargement of the superior segment left lower lobe nodule. Malignancy is likely.   In contrast, the right lower lobe nodule is probably benign.   08/17/2014 Imaging   CT- Superior segment left lower lobe pulmonary nodule measures stable since the most recent comparison study, but is again noted to have increased in size when comparing to older studies. Continued close attention will be required as neoplasm remains a con   10/26/2014 Surgery   thoracoscopic left lower lobe superior segmentectomy. Pathology with metastatic adenocarcinoma c/w colonic primary   01/04/2015 - 06/27/2015 Chemotherapy   FOLFOX+Avastin (Avastin started on 01/18/2015).   01/09/2015 Imaging   CT CAP- Interval wedge resection of metastasis within the left lower lobe. No evidence of  new metastatic disease within the chest, abdomen or pelvis.   02/15/2015 Treatment Plan Change   Treatment deferred due to renal function change (patient poor historian) with N/V.   02/22/2015 Treatment Plan Change   Added Aranesp at renal dosing to supportive therpay plan.   03/08/2015 Treatment Plan Change   Defer treatment x 1 week   04/12/2015 Adverse Reaction   Patient reported mouth sores.  None on exam.     04/12/2015 Treatment Plan Change   5FU bolus is discontinued for "mouth sores"   04/12/2015 Adverse Reaction   Increasing Creatinine Cl.  CrCl calculated to be 30.7.     04/12/2015 Treatment Plan Change   Oxaliplatin dose reduced by 20% to 65 mg/m2 based upon creatinine clearance.   05/24/2015 Treatment Plan Change   5FU CI decreased by 15%.   08/29/2015 Imaging   CT CAP- Status post APR with left lower quadrant colostomy, and some stable presacral scarring. No definite evidence of metastatic disease in the CAP.  There is a new 3 mm nodule in the right lower lobe which is highly nonspecific. Attention on follow-up.   01/03/2016 Imaging   CT chest- No definite findings to suggest metastatic disease to the lungs. Previously noted 3 mm right lower lobe pulmonary nodule stable, favored to be benign.   08/22/2016 Imaging   CT CAP-1. Abdominoperineal resection with stable scarring and left lower quadrant colostomy. 2. 4 mm right lower lobe nodule, stable. 3. 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 female presents today for follow-up.  Reports overall doing well.   Denies any significant fatigue.  Denies any obvious signs of bleeding.  No change in bowel habits.  No weight loss.  No abdominal pain.  Appetite is stable.  She is here for repeat labs and office visit.  REVIEW OF SYSTEMS:  Review of Systems  Constitutional: Negative.   HENT:  Negative.   Eyes: Negative.   Respiratory: Negative.   Cardiovascular: Negative.   Gastrointestinal: Negative.   Endocrine: Negative.   Genitourinary: Negative.    Musculoskeletal: Positive for gait problem and myalgias.  Skin: Negative.   Neurological: Positive for extremity weakness and gait problem.  Hematological: Negative.   Psychiatric/Behavioral: Negative.      PAST MEDICAL/SURGICAL HISTORY:  Past Medical History:  Diagnosis Date  . Arthritis   . Asthma   . Cancer (HCC)    Rectal   . Chronic renal disease, stage 3, moderately decreased glomerular filtration rate between 30-59 mL/min/1.73 square meter 04/27/2014  . Depression   . Diabetes mellitus    years  . GERD (gastroesophageal reflux disease)   . Gout   . History of recurrent UTIs   . Hypertension   . Iron deficiency 04/27/2014  . Mental retardation    stopped school at 9th grade   . Numbness and tingling in hands    x several months   . Rectal cancer ([place])   . Seizures ([place])    more than 4 yrs since last seizure. UNknown etiology  . Shortness of breath    with exertion  . Vitamin B 12 deficiency 02/01/2015   Incidentally found without antibody testing.     Past Surgical History:  Procedure Laterality Date  . ABDOMINAL HYSTERECTOMY    . ABDOMINAL PERINEAL BOWEL RESECTION N/A 07/26/2013   Procedure:  ABDOMINAL PERINEAL RESECTION;  Surgeon: Jamesetta So, MD;  Location: [facility];  Service: General;  Laterality: N/A;  . COLON SURGERY  2015   bowel resection/w colostomy  . COLONOSCOPY N/A 02/04/2013   Procedure: COLONOSCOPY;  Surgeon: Rogene Houston, MD;  Location: [facility];  Service: Endoscopy;  Laterality: N/A;  225  .  COLONOSCOPY N/A 05/12/2014   Procedure: COLONOSCOPY;  Surgeon: Rogene Houston, MD;  [facility];  Service: Endoscopy;  Laterality: N/A;  1030  . COLONOSCOPY WITH ESOPHAGOGASTRODUODENOSCOPY (EGD) N/A 01/14/2013   Procedure: COLONOSCOPY WITH ESOPHAGOGASTRODUODENOSCOPY (EGD);  Surgeon: Rogene Houston, MD;  [facility];  Service: Endoscopy;  Laterality: N/A;  250-moved to 315 Ann to notify pt  . COLOSTOMY Left 07/26/2013   Procedure: COLOSTOMY;  Surgeon: Jamesetta So, MD;  Location: [facility];  Service: General;  Laterality: Left;  . EUS N/A 02/18/2013   Procedure: LOWER ENDOSCOPIC ULTRASOUND (EUS);  Surgeon: Milus Banister, MD;  Location: [facility];  Service: Endoscopy;  Laterality: N/A;  . FLEXIBLE SIGMOIDOSCOPY N/A 07/26/2013   Procedure: FLEXIBLE SIGMOIDOSCOPY;  Surgeon: Jamesetta So, MD;  Location: [facility];  Service: General;  Laterality: N/A;  . LYMPH NODE DISSECTION Left 10/26/2014   Procedure: LYMPH NODE DISSECTION;  Surgeon: [name], MD;  Location: [place];  Service: Thoracic;  Laterality: Left;  .  MULTIPLE EXTRACTIONS WITH ALVEOLOPLASTY N/A 11/23/2012   Procedure: MULTIPLE EXTRACION 1, 2, 4, 5, 6, 7, 8, 9, 10, 11, 12, 13, 14, 17, 18, 20, 23, 24, 25, 26, 28, 29, 32 WITH ALVEOLOPLASTY, REMOVE BILATERAL TORI;  Surgeon: Gae Bon, DDS;  Location: [place];  Service: Oral Surgery;  Laterality: N/A;  . PORTACATH PLACEMENT Left 01/02/2015   Procedure: INSERTION PORT-A-CATH;  Surgeon: Aviva Signs, MD;  Location[facility];  Service: General;  Laterality: Left;  . SALPINGOOPHORECTOMY Bilateral 07/26/2013   Procedure: SALPINGO OOPHORECTOMY;  Surgeon: Jamesetta So, MD;  Location: [facility];  Service: General;  Laterality: Bilateral;  . SEGMENTECOMY Left 10/26/2014   Procedure: LEFT LOWER LOBE SUPERIOR SEGMENTECTOMY;  Surgeon: [name], MD;  Location: [place];  Service: Thoracic;  Laterality: Left;  . SUPRACERVICAL ABDOMINAL HYSTERECTOMY N/A 07/26/2013    Procedure: HYSTERECTOMY SUPRACERVICAL ABDOMINAL ;  Surgeon: Jamesetta So, MD;  [facility];  Service: General;  Laterality: N/A;  . VIDEO ASSISTED THORACOSCOPY Left 10/26/2014   Procedure: LEFT VIDEO ASSISTED THORACOSCOPY;  Surgeon: [name], MD;  Location: [place];  Service: Thoracic;  Laterality: Left;     SOCIAL HISTORY:  Social History   Socioeconomic History  . Marital status: Single    Spouse name: Not on file  . Number of children: Not on file  . Years of education: Not on file  . Highest education level: Not on file  Occupational History  . Not on file  Social Needs  . Financial resource strain: Not on file  . Food insecurity    Worry: Not on file    Inability: Not on file  . Transportation needs    Medical: Not on file    Non-medical: Not on file  Tobacco Use  . Smoking status: Former Smoker    Types: Cigarettes    Quit date: 07/16/2008    Years since quitting: 10.5  . Smokeless tobacco: Never Used  . Tobacco comment: unknown when stopped smoking      long time  Substance and Sexual Activity  . Alcohol use: No  . Drug use: No  . Sexual activity: Not on file  Lifestyle  . Physical activity    Days per week: Not on file    Minutes per session: Not on file  . Stress: Not on file  Relationships  . Social Herbalist on phone: Not on file    Gets together: Not on file    Attends religious service: Not on file    Active member of club or organization: Not on file    Attends meetings of clubs or organizations: Not on file    Relationship status: Not on file  . Intimate partner violence    Fear of current or ex partner: Not on file    Emotionally abused: Not on file    Physically abused: Not on file    Forced sexual activity: Not on file  Other Topics Concern  . Not on file  Social History Narrative  . Not on file    FAMILY HISTORY:  Family History  Family history unknown: Yes    CURRENT MEDICATIONS:  Outpatient Encounter  Medications as of 12/01/2018  Medication Sig Note  . ACCU-CHEK AVIVA PLUS test strip    . aspirin EC 81 MG tablet Take 81 mg by mouth daily.   . calcium carbonate (TUMS) 500 MG chewable tablet Chew 2 tablets (400 mg of elemental calcium total) by mouth  3 (three) times daily.   . calcium-vitamin D (OSCAL WITH D) 500-200 MG-UNIT tablet Take 2 tablets by mouth daily with breakfast.   . docusate sodium (COLACE) 100 MG capsule Take 100 mg by mouth 2 (two) times daily.   . fish oil-omega-3 fatty acids 1000 MG capsule Take 1 g by mouth 2 (two) times daily.   . Fluticasone-Salmeterol (ADVAIR) 250-50 MCG/DOSE AEPB Inhale 1 puff into the lungs every 12 (twelve) hours.   . furosemide (LASIX) 40 MG tablet Take 40 mg by mouth daily.    . metFORMIN (GLUCOPHAGE) 1000 MG tablet Take 1,000 mg by mouth daily.    . metoprolol (TOPROL-XL) 200 MG 24 hr tablet Take 200 mg by mouth every evening.    . montelukast (SINGULAIR) 10 MG tablet Take 10 mg by mouth at bedtime.   Dellia Nims CALCIUM + D3 500-200 MG-UNIT TABS TAKE 2 TABLETS BY MOUTH DAILY WITH BREAKFAST. (Patient taking differently: Take 2 tablets by mouth daily with breakfast. )   . pantoprazole (PROTONIX) 40 MG tablet Take 40 mg by mouth every morning.    . phenytoin (DILANTIN) 100 MG ER capsule Take 100 mg by mouth 2 (two) times daily. 02/03/2013: .  . pravastatin (PRAVACHOL) 40 MG tablet Take 40 mg by mouth every evening.    [name] PROAIR HFA 108 (90 Base) MCG/ACT inhaler    . traZODone (DESYREL) 50 MG tablet    . [DISCONTINUED] amoxicillin (AMOXIL) 500 MG capsule    . [DISCONTINUED] oxyCODONE-acetaminophen (PERCOCET) 5-325 MG tablet Take 1 tablet by mouth every 8 (eight) hours as needed for severe pain.   . [DISCONTINUED] oxyCODONE-acetaminophen (PERCOCET/ROXICET) 5-325 MG tablet Take 1 tablet by mouth every 8 (eight) hours as needed for severe pain.   . [EXPIRED] cyanocobalamin ((VITAMIN B-12)) injection 1,000 mcg     No facility-administered encounter  medications on file as of 12/01/2018.     ALLERGIES:  No Known Allergies   PHYSICAL EXAM:  ECOG Performance status: 1  Vitals:   12/01/18 1528  BP: (!) 160/64  Pulse: 86  Resp: 18  Temp: (!) 97.1 F (36.2 C)  SpO2: 98%   Filed Weights   12/01/18 1528  Weight: 258 lb 4.8 oz (117.2 kg)    Physical Exam Constitutional:      Appearance: She is obese.  HENT:     Head: Normocephalic.     Right Ear: External ear normal.     Left Ear: External ear normal.     Nose: Nose normal.     Mouth/Throat:     Pharynx: Oropharynx is clear.  Eyes:     Conjunctiva/sclera: Conjunctivae normal.  Neck:     Musculoskeletal: Normal range of motion.  Cardiovascular:     Rate and Rhythm: Normal rate and regular rhythm.     Pulses: Normal pulses.     Heart sounds: Normal heart sounds.  Pulmonary:     Effort: Pulmonary effort is normal.  Abdominal:     General: Bowel sounds are normal.  Musculoskeletal:     Comments: Decreased range of motion  Skin:    General: Skin is warm.  Neurological:     General: No focal deficit present.     Mental Status: She is alert and oriented to person, place, and time.  Psychiatric:        Mood and Affect: Mood normal.        Behavior: Behavior normal.        Thought Content: Thought content normal.  Judgment: Judgment normal.      LABORATORY DATA:  I have reviewed the labs as listed.  CBC    Component Value Date/Time   WBC 5.9 11/04/2018 1215   RBC 3.95 11/04/2018 1215   HGB 10.8 (L) 11/04/2018 1215   HCT 35.7 (L) 11/04/2018 1215   PLT 169 11/04/2018 1215   MCV 90.4 11/04/2018 1215   MCH 27.3 11/04/2018 1215   MCHC 30.3 11/04/2018 1215   RDW 14.2 11/04/2018 1215   LYMPHSABS 0.8 11/04/2018 1215   MONOABS 0.5 11/04/2018 1215   EOSABS 0.4 11/04/2018 1215   BASOSABS 0.0 11/04/2018 1215   CMP Latest Ref Rng & Units 11/04/2018 04/03/2018 11/30/2017  Glucose 70 - 99 mg/dL 97 155(H) 134(H)  BUN 8 - 23 mg/dL 38(H) 45(H) 38(H)  Creatinine  0.44 - 1.00 mg/dL 1.32(H) 1.41(H) 1.42(H)  Sodium 135 - 145 mmol/L 137 138 138  Potassium 3.5 - 5.1 mmol/L 4.7 4.5 4.6  Chloride 98 - 111 mmol/L 99 99 98  CO2 22 - 32 mmol/L 26 28 28   Calcium 8.9 - 10.3 mg/dL 9.1 8.9 8.9  Total Protein 6.5 - 8.1 g/dL 7.6 7.1 7.2  Total Bilirubin 0.3 - 1.2 mg/dL 0.7 0.4 0.5  Alkaline Phos 38 - 126 U/L 487(H) 322(H) 372(H)  AST 15 - 41 U/L 17 17 25   ALT 0 - 44 U/L 16 17 28          ASSESSMENT & PLAN:   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   - Stage IIa diagnosed in November 2014 progressive stage IVa in August 2016 - Pt was initially diagnosed in 02/2013; underwent pelvic radiation with Xeloda chemotherapy, followed by surgical resection with APR, followed by 6 months of adjuvant Xeloda therapy.  - Subsequent CT imaging revealed enlarging LLL pulmonary nodule. -  She underwent LLL resection with path confirming metastatic adenocarcinoma consistent with colon primary.  - Started chemotherapy with FOLFOX/Avastin in 12/2014. Chemotherapy course complicated by several treatment deferments for adverse side effects requiring dose reductions/discontinuations.   -CT CAP completed in January 2020 was negative for any new or progressive disease.  Stable 8 mm right upper lobe groundglass nodule. -CT chest performed in August 2020 did not reveal any new or progressive disease.  Again noted stable 8 mm right upper lobe nodule. -CEA slightly elevated at 5.6.  We will continue to monitor. -We will continue with every 63-month follow-ups.  Plan to repeat CT CAP in January 2021.  2.  Vitamin B12 deficiency -Patient is vitamin B12 injections monthly.  We will need to repeat a vitamin B12 level.         [place] [phone]

## 2019-01-27 NOTE — Assessment & Plan Note (Signed)
1. 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   - Stage IIa diagnosed in November 2014 progressive stage IVa in August 2016 - Pt was initially diagnosed in 02/2013; underwent pelvic radiation with Xeloda chemotherapy, followed by surgical resection with APR, followed by 6 months of adjuvant Xeloda therapy.  - Subsequent CT imaging revealed enlarging LLL pulmonary nodule. -  She underwent LLL resection with path confirming metastatic adenocarcinoma consistent with colon primary.  - Started chemotherapy with FOLFOX/Avastin in 12/2014. Chemotherapy course complicated by several treatment deferments for adverse side effects requiring dose reductions/discontinuations.   -CT CAP completed in January 2020 was negative for any new or progressive disease.  Stable 8 mm right upper lobe groundglass nodule. -CT chest performed in August 2020 did not reveal any new or progressive disease.  Again noted stable 8 mm right upper lobe nodule. -CEA slightly elevated at 5.6.  We will continue to monitor. -We will continue with every 77-month follow-ups.  Plan to repeat CT CAP in January 2021.  2.  Vitamin B12 deficiency -Patient is vitamin B12 injections monthly.  We will need to repeat a vitamin B12 level.

## 2019-02-01 ENCOUNTER — Ambulatory Visit (HOSPITAL_COMMUNITY): Payer: Medicare Other

## 2019-02-02 ENCOUNTER — Inpatient Hospital Stay (HOSPITAL_COMMUNITY): Payer: Medicare Other | Attending: Oncology

## 2019-03-03 ENCOUNTER — Ambulatory Visit (HOSPITAL_COMMUNITY): Payer: Medicare Other

## 2019-03-04 ENCOUNTER — Other Ambulatory Visit: Payer: Self-pay

## 2019-03-04 ENCOUNTER — Encounter: Payer: Self-pay | Admitting: Podiatry

## 2019-03-04 ENCOUNTER — Ambulatory Visit (INDEPENDENT_AMBULATORY_CARE_PROVIDER_SITE_OTHER): Payer: Medicare Other | Admitting: Podiatry

## 2019-03-04 DIAGNOSIS — B351 Tinea unguium: Secondary | ICD-10-CM | POA: Diagnosis not present

## 2019-03-04 DIAGNOSIS — E1142 Type 2 diabetes mellitus with diabetic polyneuropathy: Secondary | ICD-10-CM

## 2019-03-04 DIAGNOSIS — M79675 Pain in left toe(s): Secondary | ICD-10-CM

## 2019-03-04 DIAGNOSIS — M79674 Pain in right toe(s): Secondary | ICD-10-CM

## 2019-03-04 NOTE — Progress Notes (Signed)
Complaint:  Visit Type: Patient returns to my office for continued preventative foot care services. Complaint: Patient states" my nails have grown long and thick and become painful to walk and wear shoes" Patient has been diagnosed with DM with no foot complications. The patient presents for preventative foot care services. No changes to ROS.   Podiatric Exam: Vascular: dorsalis pedis and posterior tibial pulses are palpable bilateral. Capillary return is immediate. Temperature gradient is WNL. Skin turgor WNL  Sensorium: Diminished Semmes Weinstein monofilament test. Normal tactile sensation bilaterally. Nail Exam: Pt has thick disfigured discolored nails with subungual debris noted bilateral entire nail hallux through fifth toenails Ulcer Exam: There is no evidence of ulcer or pre-ulcerative changes or infection. Orthopedic Exam: Muscle tone and strength are WNL. No limitations in general ROM. No crepitus or effusions noted. Foot type and digits show no abnormalities. Bony prominences are unremarkable. Skin: No Porokeratosis. No infection or ulcers.    Diagnosis:  Onychomycosis, , Pain in right toe, pain in left toes  Treatment & Plan Procedures and Treatment: Consent by patient was obtained for treatment procedures.   Debridement of mycotic and hypertrophic toenails, 1 through 5 bilateral and clearing of subungual debris. No ulceration, no infection noted. Prescription for diabetic shoes given to this patient.   Return Visit-Office Procedure: Patient instructed to return to the office for a follow up visit 4 months for continued evaluation and treatment.    Gardiner Barefoot DPM

## 2019-03-05 ENCOUNTER — Inpatient Hospital Stay (HOSPITAL_COMMUNITY): Payer: Medicare Other | Attending: Oncology

## 2019-03-05 ENCOUNTER — Encounter (HOSPITAL_COMMUNITY): Payer: Self-pay

## 2019-03-05 VITALS — BP 151/87 | HR 72 | Temp 97.5°F | Resp 18

## 2019-03-05 DIAGNOSIS — E538 Deficiency of other specified B group vitamins: Secondary | ICD-10-CM | POA: Diagnosis not present

## 2019-03-05 MED ORDER — CYANOCOBALAMIN 1000 MCG/ML IJ SOLN
1000.0000 ug | Freq: Once | INTRAMUSCULAR | Status: AC
  Administered 2019-03-05: 1000 ug via INTRAMUSCULAR
  Filled 2019-03-05: qty 1

## 2019-03-26 ENCOUNTER — Other Ambulatory Visit (HOSPITAL_COMMUNITY): Payer: Self-pay | Admitting: *Deleted

## 2019-03-26 DIAGNOSIS — C20 Malignant neoplasm of rectum: Secondary | ICD-10-CM

## 2019-03-26 DIAGNOSIS — E538 Deficiency of other specified B group vitamins: Secondary | ICD-10-CM

## 2019-03-29 ENCOUNTER — Inpatient Hospital Stay (HOSPITAL_COMMUNITY): Payer: Medicare Other | Attending: Hematology

## 2019-03-29 ENCOUNTER — Ambulatory Visit (HOSPITAL_COMMUNITY): Admission: RE | Admit: 2019-03-29 | Payer: Medicare Other | Source: Ambulatory Visit

## 2019-03-31 ENCOUNTER — Ambulatory Visit (HOSPITAL_COMMUNITY): Payer: Medicare Other | Admitting: Hematology

## 2019-04-05 ENCOUNTER — Ambulatory Visit (HOSPITAL_COMMUNITY): Payer: Medicare Other | Admitting: Hematology

## 2019-04-05 ENCOUNTER — Ambulatory Visit (HOSPITAL_COMMUNITY): Payer: Medicare Other

## 2019-04-28 ENCOUNTER — Inpatient Hospital Stay (HOSPITAL_COMMUNITY): Payer: Medicare Other | Attending: Hematology

## 2019-04-28 ENCOUNTER — Other Ambulatory Visit: Payer: Self-pay

## 2019-04-28 ENCOUNTER — Ambulatory Visit (HOSPITAL_COMMUNITY)
Admission: RE | Admit: 2019-04-28 | Discharge: 2019-04-28 | Disposition: A | Discharge status: Home / Self Care | Payer: Medicare Other | Source: Ambulatory Visit | Attending: Hematology | Admitting: Hematology

## 2019-04-28 DIAGNOSIS — Z923 Personal history of irradiation: Secondary | ICD-10-CM | POA: Diagnosis not present

## 2019-04-28 DIAGNOSIS — Z7982 Long term (current) use of aspirin: Secondary | ICD-10-CM | POA: Insufficient documentation

## 2019-04-28 DIAGNOSIS — C20 Malignant neoplasm of rectum: Secondary | ICD-10-CM | POA: Diagnosis present

## 2019-04-28 DIAGNOSIS — E538 Deficiency of other specified B group vitamins: Secondary | ICD-10-CM | POA: Diagnosis not present

## 2019-04-28 DIAGNOSIS — C7800 Secondary malignant neoplasm of unspecified lung: Secondary | ICD-10-CM | POA: Insufficient documentation

## 2019-04-28 DIAGNOSIS — Z933 Colostomy status: Secondary | ICD-10-CM | POA: Insufficient documentation

## 2019-04-28 DIAGNOSIS — Z79899 Other long term (current) drug therapy: Secondary | ICD-10-CM | POA: Insufficient documentation

## 2019-04-28 DIAGNOSIS — Z9221 Personal history of antineoplastic chemotherapy: Secondary | ICD-10-CM | POA: Insufficient documentation

## 2019-04-28 DIAGNOSIS — Z87891 Personal history of nicotine dependence: Secondary | ICD-10-CM | POA: Insufficient documentation

## 2019-04-28 LAB — CBC WITH DIFFERENTIAL/PLATELET
Abs Immature Granulocytes: 0.02 10*3/uL (ref 0.00–0.07)
Basophils Absolute: 0 10*3/uL (ref 0.0–0.1)
Basophils Relative: 0 %
Eosinophils Absolute: 0.3 10*3/uL (ref 0.0–0.5)
Eosinophils Relative: 5 %
HCT: 35.7 % — ABNORMAL LOW (ref 36.0–46.0)
Hemoglobin: 10.8 g/dL — ABNORMAL LOW (ref 12.0–15.0)
Immature Granulocytes: 0 %
Lymphocytes Relative: 15 %
Lymphs Abs: 1 10*3/uL (ref 0.7–4.0)
MCH: 27.7 pg (ref 26.0–34.0)
MCHC: 30.3 g/dL (ref 30.0–36.0)
MCV: 91.5 fL (ref 80.0–100.0)
Monocytes Absolute: 0.6 10*3/uL (ref 0.1–1.0)
Monocytes Relative: 10 %
Neutro Abs: 4.4 10*3/uL (ref 1.7–7.7)
Neutrophils Relative %: 70 %
Platelets: 193 10*3/uL (ref 150–400)
RBC: 3.9 MIL/uL (ref 3.87–5.11)
RDW: 13.8 % (ref 11.5–15.5)
WBC: 6.3 10*3/uL (ref 4.0–10.5)
nRBC: 0 % (ref 0.0–0.2)

## 2019-04-28 LAB — COMPREHENSIVE METABOLIC PANEL
ALT: 26 U/L (ref 0–44)
AST: 26 U/L (ref 15–41)
Albumin: 4 g/dL (ref 3.5–5.0)
Alkaline Phosphatase: 483 U/L — ABNORMAL HIGH (ref 38–126)
Anion gap: 12 (ref 5–15)
BUN: 45 mg/dL — ABNORMAL HIGH (ref 8–23)
CO2: 28 mmol/L (ref 22–32)
Calcium: 8.8 mg/dL — ABNORMAL LOW (ref 8.9–10.3)
Chloride: 96 mmol/L — ABNORMAL LOW (ref 98–111)
Creatinine, Ser: 1.46 mg/dL — ABNORMAL HIGH (ref 0.44–1.00)
GFR calc Af Amer: 43 mL/min — ABNORMAL LOW (ref >60)
GFR calc non Af Amer: 37 mL/min — ABNORMAL LOW (ref >60)
Glucose, Bld: 74 mg/dL (ref 70–99)
Potassium: 4.5 mmol/L (ref 3.5–5.1)
Sodium: 136 mmol/L (ref 135–145)
Total Bilirubin: 0.5 mg/dL (ref 0.3–1.2)
Total Protein: 7.7 g/dL (ref 6.5–8.1)

## 2019-04-28 LAB — LACTATE DEHYDROGENASE: LDH: 179 U/L (ref 98–192)

## 2019-04-28 MED ORDER — IOHEXOL 300 MG/ML  SOLN: 100.0000 mL | Freq: Once | INTRAMUSCULAR | Status: DC | PRN

## 2019-04-28 MED ORDER — IOHEXOL 300 MG/ML  SOLN
75.0000 mL | Freq: Once | INTRAMUSCULAR | Status: AC | PRN
  Administered 2019-04-28: 17:00:00 75 mL via INTRAVENOUS

## 2019-04-29 LAB — CEA: CEA: 5.6 ng/mL — ABNORMAL HIGH (ref 0.0–4.7)

## 2019-05-05 ENCOUNTER — Ambulatory Visit (HOSPITAL_COMMUNITY): Payer: Medicare Other | Admitting: Hematology

## 2019-05-05 ENCOUNTER — Ambulatory Visit (HOSPITAL_COMMUNITY): Payer: Medicare Other

## 2019-05-06 ENCOUNTER — Ambulatory Visit (HOSPITAL_COMMUNITY): Payer: Medicare Other

## 2019-05-11 ENCOUNTER — Encounter (INDEPENDENT_AMBULATORY_CARE_PROVIDER_SITE_OTHER): Payer: Self-pay | Admitting: *Deleted

## 2019-05-27 ENCOUNTER — Other Ambulatory Visit (HOSPITAL_COMMUNITY): Payer: Medicare Other

## 2019-06-03 ENCOUNTER — Inpatient Hospital Stay (HOSPITAL_COMMUNITY): Payer: Medicare Other

## 2019-06-03 ENCOUNTER — Inpatient Hospital Stay (HOSPITAL_COMMUNITY): Payer: Medicare Other | Attending: Oncology

## 2019-06-03 ENCOUNTER — Other Ambulatory Visit (HOSPITAL_COMMUNITY): Payer: Self-pay | Admitting: Nurse Practitioner

## 2019-06-03 ENCOUNTER — Other Ambulatory Visit: Payer: Self-pay

## 2019-06-03 ENCOUNTER — Ambulatory Visit (HOSPITAL_COMMUNITY): Payer: Medicare Other | Admitting: Hematology

## 2019-06-03 ENCOUNTER — Encounter (HOSPITAL_COMMUNITY): Payer: Self-pay

## 2019-06-03 VITALS — BP 172/74 | HR 82 | Temp 97.5°F | Resp 18

## 2019-06-03 DIAGNOSIS — E538 Deficiency of other specified B group vitamins: Secondary | ICD-10-CM

## 2019-06-03 DIAGNOSIS — C20 Malignant neoplasm of rectum: Secondary | ICD-10-CM

## 2019-06-03 DIAGNOSIS — E611 Iron deficiency: Secondary | ICD-10-CM | POA: Diagnosis not present

## 2019-06-03 DIAGNOSIS — Z85038 Personal history of other malignant neoplasm of large intestine: Secondary | ICD-10-CM | POA: Insufficient documentation

## 2019-06-03 DIAGNOSIS — C7800 Secondary malignant neoplasm of unspecified lung: Secondary | ICD-10-CM

## 2019-06-03 LAB — CBC WITH DIFFERENTIAL/PLATELET
Abs Immature Granulocytes: 0.03 10*3/uL (ref 0.00–0.07)
Basophils Absolute: 0 10*3/uL (ref 0.0–0.1)
Basophils Relative: 0 %
Eosinophils Absolute: 0.3 10*3/uL (ref 0.0–0.5)
Eosinophils Relative: 5 %
HCT: 34.1 % — ABNORMAL LOW (ref 36.0–46.0)
Hemoglobin: 10.3 g/dL — ABNORMAL LOW (ref 12.0–15.0)
Immature Granulocytes: 1 %
Lymphocytes Relative: 13 %
Lymphs Abs: 0.8 10*3/uL (ref 0.7–4.0)
MCH: 27.9 pg (ref 26.0–34.0)
MCHC: 30.2 g/dL (ref 30.0–36.0)
MCV: 92.4 fL (ref 80.0–100.0)
Monocytes Absolute: 0.5 10*3/uL (ref 0.1–1.0)
Monocytes Relative: 8 %
Neutro Abs: 4.1 10*3/uL (ref 1.7–7.7)
Neutrophils Relative %: 73 %
Platelets: 184 10*3/uL (ref 150–400)
RBC: 3.69 MIL/uL — ABNORMAL LOW (ref 3.87–5.11)
RDW: 14.2 % (ref 11.5–15.5)
WBC: 5.7 10*3/uL (ref 4.0–10.5)
nRBC: 0 % (ref 0.0–0.2)

## 2019-06-03 LAB — COMPREHENSIVE METABOLIC PANEL
ALT: 19 U/L (ref 0–44)
AST: 21 U/L (ref 15–41)
Albumin: 3.8 g/dL (ref 3.5–5.0)
Alkaline Phosphatase: 458 U/L — ABNORMAL HIGH (ref 38–126)
Anion gap: 10 (ref 5–15)
BUN: 45 mg/dL — ABNORMAL HIGH (ref 8–23)
CO2: 28 mmol/L (ref 22–32)
Calcium: 8.8 mg/dL — ABNORMAL LOW (ref 8.9–10.3)
Chloride: 98 mmol/L (ref 98–111)
Creatinine, Ser: 1.72 mg/dL — ABNORMAL HIGH (ref 0.44–1.00)
GFR calc Af Amer: 35 mL/min — ABNORMAL LOW (ref >60)
GFR calc non Af Amer: 30 mL/min — ABNORMAL LOW (ref >60)
Glucose, Bld: 92 mg/dL (ref 70–99)
Potassium: 5.2 mmol/L — ABNORMAL HIGH (ref 3.5–5.1)
Sodium: 136 mmol/L (ref 135–145)
Total Bilirubin: 0.5 mg/dL (ref 0.3–1.2)
Total Protein: 7.3 g/dL (ref 6.5–8.1)

## 2019-06-03 LAB — VITAMIN D 25 HYDROXY (VIT D DEFICIENCY, FRACTURES): Vit D, 25-Hydroxy: 22.57 ng/mL — ABNORMAL LOW (ref 30–100)

## 2019-06-03 LAB — LACTATE DEHYDROGENASE: LDH: 175 U/L (ref 98–192)

## 2019-06-03 LAB — IRON AND TIBC
Iron: 62 ug/dL (ref 28–170)
Saturation Ratios: 21 % (ref 10.4–31.8)
TIBC: 289 ug/dL (ref 250–450)
UIBC: 227 ug/dL

## 2019-06-03 LAB — VITAMIN B12: Vitamin B-12: >7500 pg/mL — ABNORMAL HIGH (ref 180–914)

## 2019-06-03 LAB — FERRITIN: Ferritin: 92 ng/mL (ref 11–307)

## 2019-06-03 LAB — FOLATE: Folate: 9.6 ng/mL (ref >5.9)

## 2019-06-03 MED ORDER — CYANOCOBALAMIN 1000 MCG/ML IJ SOLN
1000.0000 ug | Freq: Once | INTRAMUSCULAR | Status: AC
  Administered 2019-06-03: 1000 ug via INTRAMUSCULAR

## 2019-06-03 MED ORDER — CYANOCOBALAMIN 1000 MCG/ML IJ SOLN
INTRAMUSCULAR | Status: AC
  Filled 2019-06-03: qty 1

## 2019-06-03 NOTE — Progress Notes (Signed)
Patient tolerated injection with no complaints voiced.  Site clean and dry with no bruising or swelling noted at site.  Band aid applied.  Vss with discharge and left by wheelchair with no s/s of distress noted.  

## 2019-06-14 ENCOUNTER — Other Ambulatory Visit (HOSPITAL_COMMUNITY): Payer: Self-pay | Admitting: *Deleted

## 2019-06-14 DIAGNOSIS — C7800 Secondary malignant neoplasm of unspecified lung: Secondary | ICD-10-CM

## 2019-06-14 DIAGNOSIS — C20 Malignant neoplasm of rectum: Secondary | ICD-10-CM

## 2019-06-21 ENCOUNTER — Inpatient Hospital Stay (HOSPITAL_COMMUNITY): Payer: Medicare Other | Admitting: Hematology

## 2019-06-21 ENCOUNTER — Encounter (HOSPITAL_COMMUNITY): Payer: Self-pay | Admitting: Hematology

## 2019-06-21 ENCOUNTER — Other Ambulatory Visit: Payer: Self-pay

## 2019-06-21 ENCOUNTER — Inpatient Hospital Stay (HOSPITAL_COMMUNITY): Payer: Medicare Other | Attending: Oncology | Admitting: Hematology

## 2019-06-21 DIAGNOSIS — Z79899 Other long term (current) drug therapy: Secondary | ICD-10-CM | POA: Diagnosis not present

## 2019-06-21 DIAGNOSIS — Z9079 Acquired absence of other genital organ(s): Secondary | ICD-10-CM | POA: Diagnosis not present

## 2019-06-21 DIAGNOSIS — Z7951 Long term (current) use of inhaled steroids: Secondary | ICD-10-CM | POA: Insufficient documentation

## 2019-06-21 DIAGNOSIS — E538 Deficiency of other specified B group vitamins: Secondary | ICD-10-CM | POA: Insufficient documentation

## 2019-06-21 DIAGNOSIS — F329 Major depressive disorder, single episode, unspecified: Secondary | ICD-10-CM | POA: Diagnosis not present

## 2019-06-21 DIAGNOSIS — Z90710 Acquired absence of both cervix and uterus: Secondary | ICD-10-CM | POA: Diagnosis not present

## 2019-06-21 DIAGNOSIS — Z7982 Long term (current) use of aspirin: Secondary | ICD-10-CM | POA: Insufficient documentation

## 2019-06-21 DIAGNOSIS — I10 Essential (primary) hypertension: Secondary | ICD-10-CM | POA: Diagnosis not present

## 2019-06-21 DIAGNOSIS — Z794 Long term (current) use of insulin: Secondary | ICD-10-CM | POA: Diagnosis not present

## 2019-06-21 DIAGNOSIS — Z85048 Personal history of other malignant neoplasm of rectum, rectosigmoid junction, and anus: Secondary | ICD-10-CM | POA: Insufficient documentation

## 2019-06-21 DIAGNOSIS — C20 Malignant neoplasm of rectum: Secondary | ICD-10-CM

## 2019-06-21 DIAGNOSIS — C7800 Secondary malignant neoplasm of unspecified lung: Secondary | ICD-10-CM

## 2019-06-21 DIAGNOSIS — Z90722 Acquired absence of ovaries, bilateral: Secondary | ICD-10-CM | POA: Diagnosis not present

## 2019-06-21 DIAGNOSIS — Z87891 Personal history of nicotine dependence: Secondary | ICD-10-CM | POA: Insufficient documentation

## 2019-06-21 DIAGNOSIS — N183 Chronic kidney disease, stage 3 unspecified: Secondary | ICD-10-CM | POA: Diagnosis not present

## 2019-06-21 DIAGNOSIS — E119 Type 2 diabetes mellitus without complications: Secondary | ICD-10-CM | POA: Diagnosis not present

## 2019-06-21 DIAGNOSIS — Z933 Colostomy status: Secondary | ICD-10-CM | POA: Diagnosis not present

## 2019-06-21 DIAGNOSIS — D631 Anemia in chronic kidney disease: Secondary | ICD-10-CM | POA: Diagnosis not present

## 2019-06-21 NOTE — Patient Instructions (Addendum)
[place] at [facility] Discharge Instructions  You were seen today by Dr. Delton Coombes. He went over your recent lab and scan results. He will set you up for a CT scan prior to your next visit.  He will see you back in 6 months for labs and follow up.   Thank you for choosing [place] at [facility] to provide your oncology and hematology care.  To afford each patient quality time with our provider, please arrive at least 15 minutes before your scheduled appointment time.   If you have a lab appointment with the [facility] please come in thru the  Main Entrance and check in at the main information desk  You need to re-schedule your appointment should you arrive 10 or more minutes late.  We strive to give you quality time with our providers, and arriving late affects you and other patients whose appointments are after yours.  Also, if you no show three or more times for appointments you may be dismissed from the clinic at the providers discretion.     Again, thank you for choosing [facility].  Our hope is that these requests will decrease the amount of time that you wait before being seen by our physicians.       _____________________________________________________________  Should you have questions after your visit to [facility], please contact our office at [phone] between the hours of 8:00 a.m. and 4:30 p.m.  Voicemails left after 4:00 p.m. will not be returned until the following business day.  For prescription refill requests, have your pharmacy contact our office and allow 72 hours.    Cancer Center Support Programs:   > Cancer Support Group  2nd Tuesday of the month 1pm-2pm, Journey Room

## 2019-06-21 NOTE — Progress Notes (Signed)
[place], [place] [address]   CLINIC:  Medical Oncology/Hematology  PCP:  Sinda Du, MD No address on file None   REASON FOR VISIT:  Follow-up for stage IV rectal cancer.  CURRENT THERAPY: Surveillance.  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   03/25/2013 - 05/05/2013 Chemotherapy   Xeloda 1500 mg BID 5 days/week with radiation therapy.   07/26/2013 Definitive Surgery   Dr. Arnoldo Morale- Flexible sigmoidoscopy, abdominoperineal resection, total abdominal hysterectomy with bilateral salpingo-oophorectomy   08/30/2013 - 03/01/2014 Chemotherapy   Xeloda 1000 mg BID 14 days on and 7 days off x 6 months   03/08/2014 Imaging   CT CAP- Bilateral pulmonary nodules, including an 8 mm left lower lobe nodule. Metastatic disease is a concern. The left lower lobe nodule has progressed since 03/05/2013, when it measured 5 mm.   05/26/2014 Imaging   CT Chest- Continued enlargement of the superior segment left lower lobe nodule. Malignancy is likely.   In contrast, the right lower lobe nodule is probably benign.   08/17/2014 Imaging   CT- Superior segment left lower lobe pulmonary nodule measures stable since the most recent comparison study, but is again noted to have increased in size when comparing to older studies. Continued close attention will be required as neoplasm remains a con   10/26/2014 Surgery   thoracoscopic left lower lobe superior segmentectomy. Pathology with metastatic adenocarcinoma c/w colonic primary   01/04/2015 - 06/27/2015 Chemotherapy   FOLFOX+Avastin (Avastin started on 01/18/2015).   01/09/2015 Imaging   CT CAP- Interval wedge resection of metastasis within the left lower lobe. No evidence of new metastatic disease within  the chest, abdomen or pelvis.   02/15/2015 Treatment Plan Change   Treatment deferred due to renal function change (patient poor historian) with N/V.   02/22/2015 Treatment Plan Change   Added Aranesp at renal dosing to supportive therpay plan.   03/08/2015 Treatment Plan Change   Defer treatment x 1 week   04/12/2015 Adverse Reaction   Patient reported mouth sores.  None on exam.     04/12/2015 Treatment Plan Change   5FU bolus is discontinued for "mouth sores"   04/12/2015 Adverse Reaction   Increasing Creatinine Cl.  CrCl calculated to be 30.7.     04/12/2015 Treatment Plan Change   Oxaliplatin dose reduced by 20% to 65 mg/m2 based upon creatinine clearance.   05/24/2015 Treatment Plan Change   5FU CI decreased by 15%.   08/29/2015 Imaging   CT CAP- Status post APR with left lower quadrant colostomy, and some stable presacral scarring. No definite evidence of metastatic disease in the CAP.  There is a new 3 mm nodule in the right lower lobe which is highly nonspecific. Attention on follow-up.   01/03/2016 Imaging   CT chest- No definite findings to suggest metastatic disease to the lungs. Previously noted 3 mm right lower lobe pulmonary nodule stable, favored to be benign.   08/22/2016 Imaging   CT CAP-1. Abdominoperineal resection with stable scarring and left lower quadrant colostomy. 2. 4 mm right lower lobe nodule, stable. 3. 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 Crager 69 y.o. female seen for follow-up of stage IV rectal cancer.  She resides at a nursing facility/group  home.  Denies any bleeding into her colostomy bag.  Appetite is 75%.  Energy levels are 50%.  Reports taking iron tablet daily.  She walks with help of walker.  Chronic leg swellings are stable.    REVIEW OF SYSTEMS:  Review of Systems  Cardiovascular: Positive for leg swelling.  Psychiatric/Behavioral: Positive for sleep disturbance.  All other systems reviewed and are negative.    PAST MEDICAL/SURGICAL HISTORY:  Past Medical History:  Diagnosis Date  . Arthritis   . Asthma   . Cancer (HCC)    Rectal   . Chronic renal disease, stage 3, moderately decreased glomerular filtration rate between 30-59 mL/min/1.73 square meter 04/27/2014  . Depression   . Diabetes mellitus    years  . GERD (gastroesophageal reflux disease)   . Gout   . History of recurrent UTIs   . Hypertension   . Iron deficiency 04/27/2014  . Mental retardation    stopped school at 9th grade   . Numbness and tingling in hands    x several months   . Rectal cancer ([place])   . Seizures ([place])    more than 4 yrs since last seizure. UNknown etiology  . Shortness of breath    with exertion  . Vitamin B 12 deficiency 02/01/2015   Incidentally found without antibody testing.     Past Surgical History:  Procedure Laterality Date  . ABDOMINAL HYSTERECTOMY    . ABDOMINAL PERINEAL BOWEL RESECTION N/A 07/26/2013   Procedure:  ABDOMINAL PERINEAL RESECTION;  Surgeon: Jamesetta So, MD;  Location: [facility];  Service: General;  Laterality: N/A;  . COLON SURGERY  2015   bowel resection/w colostomy  . COLONOSCOPY N/A 02/04/2013   Procedure: COLONOSCOPY;  Surgeon: Rogene Houston, MD;  Location: [facility];  Service: Endoscopy;  Laterality: N/A;  225  . COLONOSCOPY N/A 05/12/2014   Procedure: COLONOSCOPY;  Surgeon: Rogene Houston, MD;  [facility];  Service: Endoscopy;  Laterality: N/A;  1030  . COLONOSCOPY WITH ESOPHAGOGASTRODUODENOSCOPY (EGD) N/A 01/14/2013   Procedure: COLONOSCOPY WITH  ESOPHAGOGASTRODUODENOSCOPY (EGD);  Surgeon: Rogene Houston, MD;  [facility];  Service: Endoscopy;  Laterality: N/A;  250-moved to 315 Ann to notify pt  . COLOSTOMY Left 07/26/2013   Procedure: COLOSTOMY;  Surgeon: Jamesetta So, MD;  Location: [facility];  Service: General;  Laterality: Left;  . EUS N/A 02/18/2013   Procedure: LOWER ENDOSCOPIC ULTRASOUND (EUS);  Surgeon: Milus Banister, MD;  Location: [facility];  Service: Endoscopy;  Laterality: N/A;  . FLEXIBLE SIGMOIDOSCOPY N/A 07/26/2013   Procedure: FLEXIBLE SIGMOIDOSCOPY;  Surgeon: Jamesetta So, MD;  Location: [facility];  Service: General;  Laterality: N/A;  . LYMPH NODE DISSECTION Left 10/26/2014   Procedure: LYMPH NODE DISSECTION;  Surgeon: [name], MD;  Location: [employer];  Service: Thoracic;  Laterality: Left;  [name] MULTIPLE EXTRACTIONS WITH ALVEOLOPLASTY N/A 11/23/2012   Procedure: MULTIPLE EXTRACION 1, 2, 4, 5, 6, 7, 8, 9, 10, 11, 12, 13, 14, 17, 18, 20, 23, 24, 25, 26, 28, 29, 32 WITH ALVEOLOPLASTY, REMOVE BILATERAL TORI;  Surgeon: Gae Bon, DDS;  Location: [place];  Service: Oral Surgery;  Laterality: N/A;  . PORTACATH PLACEMENT Left 01/02/2015   Procedure: INSERTION PORT-A-CATH;  Surgeon: Aviva Signs, MD;  Location: [facility];  Service: General;  Laterality: Left;  . SALPINGOOPHORECTOMY Bilateral 07/26/2013   Procedure: SALPINGO OOPHORECTOMY;  Surgeon: Jamesetta So, MD;  Location: [facility];  Service: General;  Laterality: Bilateral;  . SEGMENTECOMY Left 10/26/2014   Procedure: LEFT LOWER LOBE SUPERIOR SEGMENTECTOMY;  Surgeon: [name], MD;  Location: [place];  Service: Thoracic;  Laterality: Left;  . SUPRACERVICAL ABDOMINAL HYSTERECTOMY N/A 07/26/2013   Procedure: HYSTERECTOMY SUPRACERVICAL ABDOMINAL ;  Surgeon: Jamesetta So, MD;  Location: [facility];  Service: General;  Laterality: N/A;  . VIDEO ASSISTED THORACOSCOPY Left 10/26/2014   Procedure: LEFT VIDEO ASSISTED THORACOSCOPY;  Surgeon: [name],  MD;  Location: [place];  Service: Thoracic;  Laterality: Left;     SOCIAL HISTORY:  Social History   Socioeconomic History  . Marital status: Single    Spouse name: Not on file  . Number of children: Not on file  . Years of education: Not on file  . Highest education level: Not on file  Occupational History  . Not on file  Tobacco Use  . Smoking status: Former Smoker    Types: Cigarettes    Quit date: 07/16/2008    Years since quitting: 10.9  . Smokeless tobacco: Never Used  . Tobacco comment: unknown when stopped smoking      long time  Substance and Sexual Activity  . Alcohol use: No  . Drug use: No  . Sexual activity: Not on file  Other Topics Concern  . Not on file  Social History Narrative  . Not on file   Social Determinants of Health   Financial Resource Strain:   . Difficulty of Paying Living Expenses:   Food Insecurity:   . Worried About Charity fundraiser in the Last Year:   . Arboriculturist in the Last Year:   Transportation Needs:   . Film/video editor (Medical):   [name] Lack of Transportation (Non-Medical):   Physical Activity:   . Days of Exercise per Week:   . Minutes of Exercise per Session:   Stress:   . Feeling of Stress :   Social Connections:   . Frequency of Communication with Friends and Family:   . Frequency of Social Gatherings with Friends and Family:   . Attends Religious Services:   . Active Member of Clubs or Organizations:   . Attends Archivist Meetings:   [name] Marital Status:   Intimate Partner Violence:   . Fear of Current or Ex-Partner:   . Emotionally Abused:   [name] Physically Abused:   . Sexually Abused:     FAMILY HISTORY:  Family History  Family history unknown: Yes    CURRENT MEDICATIONS:  Outpatient Encounter Medications as of 06/21/2019  Medication Sig Note  . [DISCONTINUED] calcium carbonate (OS-CAL) 1250 (500 Ca) MG chewable tablet Chew by mouth.   [name] ACCU-CHEK AVIVA PLUS test strip    . aspirin EC 81 MG  tablet Take 81 mg by mouth daily.   . calcium carbonate (TUMS) 500 MG chewable tablet Chew 2 tablets (400 mg of elemental calcium total) by mouth 3 (three) times daily.   . calcium-vitamin D (OSCAL WITH D) 500-200 MG-UNIT tablet Take 2 tablets by mouth daily with breakfast.   . docusate sodium (COLACE) 100  MG capsule Take 100 mg by mouth 2 (two) times daily.   . FEROSUL 325 (65 Fe) MG tablet TAKE (1) TABLET BY MOUTHITWICE DAILY.   . fish oil-omega-3 fatty acids 1000 MG capsule Take 1 g by mouth 2 (two) times daily.   . Fluticasone-Salmeterol (ADVAIR) 250-50 MCG/DOSE AEPB Inhale 1 puff into the lungs every 12 (twelve) hours.   . furosemide (LASIX) 40 MG tablet Take 40 mg by mouth daily.    [name] LANTUS SOLOSTAR 100 UNIT/ML Solostar Pen SMARTSIG:12 Unit(s) SUB-Q Every Night   . metFORMIN (GLUCOPHAGE) 1000 MG tablet Take 1,000 mg by mouth daily.    . metoprolol (TOPROL-XL) 200 MG 24 hr tablet Take 200 mg by mouth every evening.    . montelukast (SINGULAIR) 10 MG tablet Take 10 mg by mouth at bedtime.   [name] NOVOFINE AUTOCOVER 30G X 8 MM MISC USE ONCE DAILY WITHOINSULIN PEN(S).   Dellia Nims CALCIUM + D3 500-200 MG-UNIT TABS TAKE 2 TABLETS BY MOUTH DAILY WITH BREAKFAST. (Patient taking differently: Take 2 tablets by mouth daily with breakfast. )   . pantoprazole (PROTONIX) 40 MG tablet Take 40 mg by mouth every morning.    . phenytoin (DILANTIN) 100 MG ER capsule Take 100 mg by mouth 2 (two) times daily. 02/03/2013: .  . pravastatin (PRAVACHOL) 40 MG tablet Take 40 mg by mouth every evening.    [name] PROAIR HFA 108 (90 Base) MCG/ACT inhaler Inhale 1 puff into the lungs every 4 (four) hours as needed.    . traZODone (DESYREL) 50 MG tablet Take 50 mg by mouth at bedtime as needed.    . [DISCONTINUED] ALLERGY RELIEF 10 MG tablet Take 10 mg by mouth daily.    No facility-administered encounter medications on file as of 06/21/2019.    ALLERGIES:  No Known Allergies   PHYSICAL EXAM:  ECOG Performance status:  2  Vitals:   06/21/19 1425  BP: (!) 159/64  Pulse: 79  Resp: 16  Temp: (!) 96.9 F (36.1 C)  SpO2: 98%   Filed Weights   06/21/19 1425  Weight: 235 lb 4.8 oz (106.7 kg)    Physical Exam Vitals reviewed.  Constitutional:      Appearance: Normal appearance.  Cardiovascular:     Rate and Rhythm: Normal rate and regular rhythm.     Heart sounds: Normal heart sounds.  Pulmonary:     Effort: Pulmonary effort is normal.     Breath sounds: Normal breath sounds.  Abdominal:     General: There is no distension.     Palpations: Abdomen is soft.  Neurological:     General: No focal deficit present.     Mental Status: She is alert and oriented to person, place, and time.  Psychiatric:        Mood and Affect: Mood normal.        Behavior: Behavior normal.      LABORATORY DATA:  I have reviewed the labs as listed.  CBC    Component Value Date/Time   WBC 5.7 06/03/2019 1443   RBC 3.69 (L) 06/03/2019 1443   HGB 10.3 (L) 06/03/2019 1443   HCT 34.1 (L) 06/03/2019 1443   PLT 184 06/03/2019 1443   MCV 92.4 06/03/2019 1443   MCH 27.9 06/03/2019 1443   MCHC 30.2 06/03/2019 1443   RDW 14.2 06/03/2019 1443   LYMPHSABS 0.8 06/03/2019 1443   MONOABS 0.5 06/03/2019 1443   EOSABS 0.3 06/03/2019 1443   BASOSABS 0.0 06/03/2019 1443  CMP Latest Ref Rng & Units 06/03/2019 04/28/2019 11/04/2018  Glucose 70 - 99 mg/dL 92 74 97  BUN 8 - 23 mg/dL 45(H) 45(H) 38(H)  Creatinine 0.44 - 1.00 mg/dL 1.72(H) 1.46(H) 1.32(H)  Sodium 135 - 145 mmol/L 136 136 137  Potassium 3.5 - 5.1 mmol/L 5.2(H) 4.5 4.7  Chloride 98 - 111 mmol/L 98 96(L) 99  CO2 22 - 32 mmol/L 28 28 26   Calcium 8.9 - 10.3 mg/dL 8.8(L) 8.8(L) 9.1  Total Protein 6.5 - 8.1 g/dL 7.3 7.7 7.6  Total Bilirubin 0.3 - 1.2 mg/dL 0.5 0.5 0.7  Alkaline Phos 38 - 126 U/L 458(H) 483(H) 487(H)  AST 15 - 41 U/L 21 26 17   ALT 0 - 44 U/L 19 26 16        DIAGNOSTIC IMAGING:  I have independently reviewed the scans and discussed with the  patient.    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 -Left lower lobe lung nodule in August 2016, status post resection confirming metastatic adenocarcinoma. -FOLFOX and Avastin from October 2016 through 06/27/2015. -CEA on 04/29/2019 was 5.6. -We reviewed results of CT scan from 04/28/2019.  No compelling evidence of active malignancy.  Widespread trabecular coarsening and sclerosis in the skeleton favoring Paget's disease.  Stable scarring/atelectasis in the right upper lobe and right lower lobe.  Severely atrophic left kidney. -LFTs are also within normal limits. -We will see her back in 6 months for follow-up with repeat CT scans.  We will do CT scan without IV contrast because of her CKD.  We will also check CEA level.  2.  Vitamin B12 deficiency: -She will continue B12 injections monthly.  B12 was normal on 06/03/2019.  3.  Normocytic anemia: -Hemoglobin is 10.3.  Ferritin is 92 and percent saturation is 21. -This is anemia from CKD and relative iron deficiency. -She is taking iron tablet daily.      Orders placed this encounter:  Orders Placed This Encounter  Procedures  . CT Abdomen Pelvis Wo Contrast  . Folate  . Vitamin B12  . Iron and TIBC  . Ferritin  . CEA  . CBC with Differential  . Comprehensive metabolic panel      Derek Jack, MD [place] [phone]

## 2019-06-21 NOTE — Assessment & Plan Note (Addendum)
1.  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 -Left lower lobe lung nodule in August 2016, status post resection confirming metastatic adenocarcinoma. -FOLFOX and Avastin from October 2016 through 06/27/2015. -CEA on 04/29/2019 was 5.6. -We reviewed results of CT scan from 04/28/2019.  No compelling evidence of active malignancy.  Widespread trabecular coarsening and sclerosis in the skeleton favoring Paget's disease.  Stable scarring/atelectasis in the right upper lobe and right lower lobe.  Severely atrophic left kidney. -LFTs are also within normal limits. -We will see her back in 6 months for follow-up with repeat CT scans.  We will do CT scan without IV contrast because of her CKD.  We will also check CEA level.  2.  Vitamin B12 deficiency: -She will continue B12 injections monthly.  B12 was normal on 06/03/2019.  3.  Normocytic anemia: -Hemoglobin is 10.3.  Ferritin is 92 and percent saturation is 21. -This is anemia from CKD and relative iron deficiency. -She is taking iron tablet daily.

## 2019-06-30 ENCOUNTER — Ambulatory Visit (HOSPITAL_COMMUNITY): Payer: Medicare Other | Admitting: Nurse Practitioner

## 2019-07-01 ENCOUNTER — Ambulatory Visit (INDEPENDENT_AMBULATORY_CARE_PROVIDER_SITE_OTHER): Payer: Medicare Other | Admitting: Podiatry

## 2019-07-01 ENCOUNTER — Encounter: Payer: Self-pay | Admitting: Podiatry

## 2019-07-01 ENCOUNTER — Other Ambulatory Visit: Payer: Self-pay

## 2019-07-01 VITALS — Temp 98.2°F

## 2019-07-01 DIAGNOSIS — M79674 Pain in right toe(s): Secondary | ICD-10-CM | POA: Diagnosis not present

## 2019-07-01 DIAGNOSIS — B351 Tinea unguium: Secondary | ICD-10-CM | POA: Diagnosis not present

## 2019-07-01 DIAGNOSIS — R609 Edema, unspecified: Secondary | ICD-10-CM | POA: Insufficient documentation

## 2019-07-01 DIAGNOSIS — E1142 Type 2 diabetes mellitus with diabetic polyneuropathy: Secondary | ICD-10-CM | POA: Diagnosis not present

## 2019-07-01 DIAGNOSIS — M79675 Pain in left toe(s): Secondary | ICD-10-CM

## 2019-07-01 NOTE — Progress Notes (Signed)
This patient returns to my office for at risk foot care.  This patient requires this care by a professional since this patient will be at risk due to having diabetic neuropathy, and chronic kidney disease.  Patient has two nails growing big toenail left foot. This patient is unable to cut nails himself since the patient cannot reach his nails.These nails are painful walking and wearing shoes.  This patient presents for at risk foot care today.  General Appearance  Alert, conversant and in no acute stress.  Vascular  Dorsalis pedis and posterior tibial  pulses are palpable  Bilaterally despite severe swelling both feet/legs..  Capillary return is within normal limits  bilaterally. Temperature is within normal limits  bilaterally.  Neurologic  Senn-Weinstein monofilament wire test within normal limits  bilaterally. Muscle power within normal limits bilaterally.  Nails Thick disfigured discolored nails with subungual debris  from hallux to fifth toes bilaterally. No evidence of bacterial infection or drainage bilaterally. Left hallux nail has transverse split with no infection noted.    Orthopedic  No limitations of motion  feet .  No crepitus or effusions noted.  No bony pathology or digital deformities noted.  Skin  normotropic skin with no porokeratosis noted bilaterally.  No signs of infections or ulcers noted.     Onychomycosis  Pain in right toes  Pain in left toes  Consent was obtained for treatment procedures.   Mechanical debridement of nails 1-5  bilaterally performed with a nail nipper.  Filed with dremel without incident.    Return office visit       3 months               Told patient to return for periodic foot care and evaluation due to potential at risk complications.   Gardiner Barefoot DPM

## 2019-08-05 ENCOUNTER — Ambulatory Visit: Payer: Medicare Other | Admitting: Podiatry

## 2019-09-30 ENCOUNTER — Ambulatory Visit: Payer: Medicare Other | Admitting: Podiatry

## 2019-12-29 ENCOUNTER — Inpatient Hospital Stay (HOSPITAL_COMMUNITY): Payer: Medicare Other | Attending: Hematology

## 2019-12-29 ENCOUNTER — Other Ambulatory Visit: Payer: Self-pay

## 2019-12-29 DIAGNOSIS — Z79899 Other long term (current) drug therapy: Secondary | ICD-10-CM | POA: Insufficient documentation

## 2019-12-29 DIAGNOSIS — D649 Anemia, unspecified: Secondary | ICD-10-CM | POA: Insufficient documentation

## 2019-12-29 DIAGNOSIS — Z7982 Long term (current) use of aspirin: Secondary | ICD-10-CM | POA: Insufficient documentation

## 2019-12-29 DIAGNOSIS — Z923 Personal history of irradiation: Secondary | ICD-10-CM | POA: Diagnosis not present

## 2019-12-29 DIAGNOSIS — Z9221 Personal history of antineoplastic chemotherapy: Secondary | ICD-10-CM | POA: Insufficient documentation

## 2019-12-29 DIAGNOSIS — N183 Chronic kidney disease, stage 3 unspecified: Secondary | ICD-10-CM | POA: Diagnosis not present

## 2019-12-29 DIAGNOSIS — I10 Essential (primary) hypertension: Secondary | ICD-10-CM | POA: Insufficient documentation

## 2019-12-29 DIAGNOSIS — Z87891 Personal history of nicotine dependence: Secondary | ICD-10-CM | POA: Insufficient documentation

## 2019-12-29 DIAGNOSIS — E538 Deficiency of other specified B group vitamins: Secondary | ICD-10-CM | POA: Diagnosis not present

## 2019-12-29 DIAGNOSIS — E119 Type 2 diabetes mellitus without complications: Secondary | ICD-10-CM | POA: Insufficient documentation

## 2019-12-29 DIAGNOSIS — C7800 Secondary malignant neoplasm of unspecified lung: Secondary | ICD-10-CM

## 2019-12-29 DIAGNOSIS — Z85048 Personal history of other malignant neoplasm of rectum, rectosigmoid junction, and anus: Secondary | ICD-10-CM | POA: Insufficient documentation

## 2019-12-29 LAB — COMPREHENSIVE METABOLIC PANEL
ALT: 15 U/L (ref 0–44)
AST: 19 U/L (ref 15–41)
Albumin: 3.6 g/dL (ref 3.5–5.0)
Alkaline Phosphatase: 393 U/L — ABNORMAL HIGH (ref 38–126)
Anion gap: 13 (ref 5–15)
BUN: 46 mg/dL — ABNORMAL HIGH (ref 8–23)
CO2: 29 mmol/L (ref 22–32)
Calcium: 8.5 mg/dL — ABNORMAL LOW (ref 8.9–10.3)
Chloride: 92 mmol/L — ABNORMAL LOW (ref 98–111)
Creatinine, Ser: 1.46 mg/dL — ABNORMAL HIGH (ref 0.44–1.00)
GFR, Estimated: 37 mL/min — ABNORMAL LOW (ref >60)
Glucose, Bld: 161 mg/dL — ABNORMAL HIGH (ref 70–99)
Potassium: 3.9 mmol/L (ref 3.5–5.1)
Sodium: 134 mmol/L — ABNORMAL LOW (ref 135–145)
Total Bilirubin: 0.3 mg/dL (ref 0.3–1.2)
Total Protein: 7.2 g/dL (ref 6.5–8.1)

## 2019-12-29 LAB — CBC WITH DIFFERENTIAL/PLATELET
Abs Immature Granulocytes: 0.02 10*3/uL (ref 0.00–0.07)
Basophils Absolute: 0 10*3/uL (ref 0.0–0.1)
Basophils Relative: 0 %
Eosinophils Absolute: 0.3 10*3/uL (ref 0.0–0.5)
Eosinophils Relative: 4 %
HCT: 32.6 % — ABNORMAL LOW (ref 36.0–46.0)
Hemoglobin: 10.1 g/dL — ABNORMAL LOW (ref 12.0–15.0)
Immature Granulocytes: 0 %
Lymphocytes Relative: 12 %
Lymphs Abs: 0.8 10*3/uL (ref 0.7–4.0)
MCH: 27.8 pg (ref 26.0–34.0)
MCHC: 31 g/dL (ref 30.0–36.0)
MCV: 89.8 fL (ref 80.0–100.0)
Monocytes Absolute: 0.5 10*3/uL (ref 0.1–1.0)
Monocytes Relative: 8 %
Neutro Abs: 4.7 10*3/uL (ref 1.7–7.7)
Neutrophils Relative %: 76 %
Platelets: 200 10*3/uL (ref 150–400)
RBC: 3.63 MIL/uL — ABNORMAL LOW (ref 3.87–5.11)
RDW: 13.7 % (ref 11.5–15.5)
WBC: 6.2 10*3/uL (ref 4.0–10.5)
nRBC: 0 % (ref 0.0–0.2)

## 2019-12-29 LAB — FERRITIN: Ferritin: 77 ng/mL (ref 11–307)

## 2019-12-29 LAB — IRON AND TIBC
Iron: 55 ug/dL (ref 28–170)
Saturation Ratios: 20 % (ref 10.4–31.8)
TIBC: 271 ug/dL (ref 250–450)
UIBC: 216 ug/dL

## 2019-12-29 LAB — VITAMIN B12: Vitamin B-12: 240 pg/mL (ref 180–914)

## 2019-12-29 LAB — FOLATE: Folate: 10.5 ng/mL (ref >5.9)

## 2019-12-30 ENCOUNTER — Other Ambulatory Visit: Payer: Self-pay

## 2019-12-30 ENCOUNTER — Inpatient Hospital Stay (HOSPITAL_COMMUNITY): Payer: Medicare Other

## 2019-12-30 ENCOUNTER — Ambulatory Visit (HOSPITAL_COMMUNITY)
Admission: RE | Admit: 2019-12-30 | Discharge: 2019-12-30 | Disposition: A | Discharge status: Home / Self Care | Payer: Medicare Other | Source: Ambulatory Visit | Attending: Hematology | Admitting: Hematology

## 2019-12-30 DIAGNOSIS — C7800 Secondary malignant neoplasm of unspecified lung: Secondary | ICD-10-CM | POA: Insufficient documentation

## 2019-12-30 DIAGNOSIS — C20 Malignant neoplasm of rectum: Secondary | ICD-10-CM | POA: Diagnosis present

## 2019-12-30 LAB — CEA: CEA: 4.9 ng/mL — ABNORMAL HIGH (ref 0.0–4.7)

## 2020-01-04 ENCOUNTER — Ambulatory Visit (HOSPITAL_COMMUNITY): Payer: Medicare Other | Admitting: Hematology

## 2020-01-10 ENCOUNTER — Other Ambulatory Visit: Payer: Self-pay

## 2020-01-10 ENCOUNTER — Inpatient Hospital Stay (HOSPITAL_BASED_OUTPATIENT_CLINIC_OR_DEPARTMENT_OTHER): Payer: Medicare Other | Admitting: Hematology

## 2020-01-10 VITALS — BP 175/71 | HR 81 | Temp 96.8°F | Resp 20 | Wt 259.6 lb

## 2020-01-10 DIAGNOSIS — Z85048 Personal history of other malignant neoplasm of rectum, rectosigmoid junction, and anus: Secondary | ICD-10-CM | POA: Diagnosis not present

## 2020-01-10 DIAGNOSIS — C20 Malignant neoplasm of rectum: Secondary | ICD-10-CM | POA: Diagnosis not present

## 2020-01-10 DIAGNOSIS — C7800 Secondary malignant neoplasm of unspecified lung: Secondary | ICD-10-CM | POA: Diagnosis not present

## 2020-01-10 NOTE — Patient Instructions (Signed)
[place] at [facility] Discharge Instructions  You were seen today by Dr. Delton Coombes. He went over your recent results and scans. You will be prescribed vitamin B12 to take daily. Dr. Delton Coombes will see you back in 6 months for labs and follow up.   Thank you for choosing [place] at [facility] to provide your oncology and hematology care.  To afford each patient quality time with our provider, please arrive at least 15 minutes before your scheduled appointment time.   If you have a lab appointment with the [facility] please come in thru the Main Entrance and check in at the main information desk  You need to re-schedule your appointment should you arrive 10 or more minutes late.  We strive to give you quality time with our providers, and arriving late affects you and other patients whose appointments are after yours.  Also, if you no show three or more times for appointments you may be dismissed from the clinic at the providers discretion.     Again, thank you for choosing [facility].  Our hope is that these requests will decrease the amount of time that you wait before being seen by our physicians.       _____________________________________________________________  Should you have questions after your visit to [facility], please contact our office at [phone] between the hours of 8:00 a.m. and 4:30 p.m.  Voicemails left after 4:00 p.m. will not be returned until the following business day.  For prescription refill requests, have your pharmacy contact our office and allow 72 hours.    Cancer Center Support Programs:   > Cancer Support Group  2nd Tuesday of the month 1pm-2pm, Journey Room

## 2020-01-10 NOTE — Progress Notes (Signed)
[name],  [id number]   CLINIC:  Medical Oncology/Hematology  PCP:  Jennifer Du, MD None None   REASON FOR VISIT:  Follow-up for stage IV rectal [name]  PRIOR THERAPY:  1. Concurrent chemoradiation with Xeloda from 03/25/2013 to 05/05/2013. 2. Abdominoperineal resection and TAH & BSO on 07/26/2013. 2. LLL lung resection in August 2016. 3. FOLFOX and Avastin x 12 cycles from 01/04/2015 to 06/27/2015.  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   03/25/2013 - 05/05/2013 Chemotherapy   Xeloda 1500 mg BID 5 days/week with radiation therapy.   07/26/2013 Definitive Surgery   Dr. Arnoldo [name]- Flexible sigmoidoscopy, abdominoperineal resection, total abdominal hysterectomy with bilateral salpingo-oophorectomy   08/30/2013 - 03/01/2014 Chemotherapy   Xeloda 1000 mg BID 14 days on and 7 days off x 6 months   03/08/2014 Imaging   CT CAP- Bilateral pulmonary nodules, including an 8 mm left lower lobe nodule. Metastatic disease is a concern. The left lower lobe nodule has progressed since 03/05/2013, when it measured 5 mm.   05/26/2014 Imaging   CT Chest- Continued enlargement of the superior segment left lower lobe nodule. Malignancy is likely.   In contrast, the right lower lobe nodule is probably benign.   08/17/2014 Imaging   CT- Superior segment left lower lobe pulmonary nodule measures stable since the most recent comparison study, but is again noted to have increased in size when comparing to older studies. Continued close attention will be required as neoplasm remains a con   10/26/2014 Surgery   thoracoscopic left lower lobe superior segmentectomy. Pathology with metastatic  adenocarcinoma c/w colonic primary   01/04/2015 - 06/27/2015 Chemotherapy   FOLFOX+Avastin (Avastin started on 01/18/2015).   01/09/2015 Imaging   CT CAP- Interval wedge resection of metastasis within the left lower lobe. No evidence of new metastatic disease within the chest, abdomen or pelvis.   02/15/2015 Treatment Plan Change   Treatment deferred due to renal function change (patient poor historian) with N/V.   02/22/2015 Treatment Plan Change   Added Aranesp at renal dosing to supportive therpay plan.   03/08/2015 Treatment Plan Change   Defer treatment x 1 week   04/12/2015 Adverse Reaction   Patient reported mouth sores.  None on exam.     04/12/2015 Treatment Plan Change   5FU bolus is discontinued for "mouth sores"   04/12/2015 Adverse Reaction   Increasing Creatinine Cl.  CrCl calculated to be 30.7.     04/12/2015 Treatment Plan Change   Oxaliplatin dose reduced by 20% to 65 mg/m2 based upon creatinine clearance.   05/24/2015 Treatment Plan Change   5FU CI decreased by 15%.   08/29/2015 Imaging   CT CAP- Status post APR with left lower quadrant colostomy, and some stable presacral scarring. No definite evidence of metastatic disease in the CAP.  There is a new 3 mm nodule in the right lower lobe which is highly nonspecific. Attention on follow-up.   01/03/2016 Imaging   CT chest- No definite findings to suggest metastatic disease to the lungs. Previously noted 3 mm right lower lobe pulmonary nodule stable, favored to be benign.   08/22/2016 Imaging   CT CAP-1. Abdominoperineal resection with stable scarring and left lower quadrant  colostomy. 2. 4 mm right lower lobe nodule, stable. 3. 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 female, returns for routine follow-up of her stage IV rectal [name]. [name] was last seen on 06/21/2019.  Today she reports feeling well. She denies having abdominal pain or seeing blood in her colostomy bag. She is taking 1 iron tablet daily.  She continues living at a group home. She is mainly sedentary during the day and sits on the porch.   REVIEW OF SYSTEMS:  Review of Systems  Constitutional: Negative for appetite change and fatigue.  Respiratory: Positive for shortness of breath.   Gastrointestinal: Negative for abdominal pain and blood in stool.  Neurological: Positive for headaches.  Psychiatric/Behavioral: Positive for sleep disturbance.  All other systems reviewed and are negative.   PAST MEDICAL/SURGICAL HISTORY:  Past Medical History:  Diagnosis Date  . Arthritis   . Asthma   . [name] (HCC)    Rectal   . Chronic renal disease, stage 3, moderately decreased glomerular filtration rate between 30-59 mL/min/1.73 square meter 04/27/2014  . Depression   . Diabetes mellitus    years  . GERD (gastroesophageal reflux disease)   . Gout   . History of recurrent UTIs   . Hypertension   . Iron deficiency 04/27/2014  . Mental retardation    stopped school at 9th grade   . Numbness and tingling in hands    x several months   . Rectal [name] ([place])   . Seizures ([place])    more than 4 yrs since last seizure. UNknown etiology  . Shortness of breath    with exertion  . Vitamin B 12 deficiency 02/01/2015   Incidentally found without antibody testing.     Past Surgical History:  Procedure Laterality Date  . ABDOMINAL HYSTERECTOMY    . ABDOMINAL PERINEAL BOWEL RESECTION N/A 07/26/2013   Procedure:  ABDOMINAL PERINEAL RESECTION;  Surgeon: Jennifer So, MD;  Location: [facility];  Service: General;   Laterality: N/A;  . COLON SURGERY  2015   bowel resection/w colostomy  . COLONOSCOPY N/A 02/04/2013   Procedure: COLONOSCOPY;  Surgeon: Jennifer Houston, MD;  [facility];  Service: Endoscopy;  Laterality: N/A;  225  . COLONOSCOPY N/A 05/12/2014   Procedure: COLONOSCOPY;  Surgeon: Jennifer Houston, MD;  [facility];  Service: Endoscopy;  Laterality: N/A;  1030  . COLONOSCOPY WITH ESOPHAGOGASTRODUODENOSCOPY (EGD) N/A 01/14/2013   Procedure: COLONOSCOPY WITH ESOPHAGOGASTRODUODENOSCOPY (EGD);  Surgeon: Jennifer Houston, MD;  [facility];  Service: Endoscopy;  Laterality: N/A;  250-moved to 315 Ann to notify pt  . COLOSTOMY Left 07/26/2013   Procedure: COLOSTOMY;  Surgeon: Jennifer So, MD;  Location: [facility];  Service: General;  Laterality: Left;  . EUS N/A 02/18/2013   Procedure: LOWER ENDOSCOPIC ULTRASOUND (EUS);  Surgeon: Milus Banister, MD;  Location[facility];  Service: Endoscopy;  Laterality: N/A;  . FLEXIBLE SIGMOIDOSCOPY N/A 07/26/2013   Procedure: FLEXIBLE SIGMOIDOSCOPY;  Surgeon: Jennifer So, MD;  Location: [facility];  Service: General;  Laterality: N/A;  . LYMPH NODE DISSECTION Left 10/26/2014   Procedure: LYMPH NODE DISSECTION;  Surgeon: [name], MD;  Location: [facility];  Service: Thoracic;  Laterality: Left;  [name] MULTIPLE EXTRACTIONS WITH ALVEOLOPLASTY N/A 11/23/2012   Procedure: MULTIPLE EXTRACION 1, 2, 4, 5, 6, 7, 8, 9, 10, 11, 12, 13, 14, 17, 18, 20, 23, 24, 25, 26, 28, 29, 32 WITH ALVEOLOPLASTY, REMOVE BILATERAL TORI;  Surgeon: Gae Bon, DDS;  Location: [facility];  Service: Oral Surgery;  Laterality: N/A;  . PORTACATH PLACEMENT Left 01/02/2015   Procedure: INSERTION PORT-A-CATH;  Surgeon: Aviva Signs, MD;  Location[facility];  Service: General;  Laterality: Left;  . SALPINGOOPHORECTOMY Bilateral 07/26/2013   Procedure: SALPINGO OOPHORECTOMY;  Surgeon: Jennifer So, MD;  [facility];  Service: General;  Laterality: Bilateral;  .  SEGMENTECOMY Left 10/26/2014   Procedure: LEFT LOWER LOBE SUPERIOR SEGMENTECTOMY;  Surgeon: [name], MD;  Location: [facility];  Service: Thoracic;  Laterality: Left;  . SUPRACERVICAL ABDOMINAL HYSTERECTOMY N/A 07/26/2013   Procedure: HYSTERECTOMY SUPRACERVICAL ABDOMINAL ;  Surgeon: Jennifer So, MD;  [facility];  Service: General;  Laterality: N/A;  . VIDEO ASSISTED THORACOSCOPY Left 10/26/2014   Procedure: LEFT VIDEO ASSISTED THORACOSCOPY;  Surgeon: [name], MD;  Location: [facility];  Service: Thoracic;  Laterality: Left;    SOCIAL HISTORY:  Social History   Socioeconomic History  . Marital status: Single    Spouse name: Not on file  . Number of children: Not on file  . Years of education: Not on file  . Highest education level: Not on file  Occupational History  . Not on file  Tobacco Use  . Smoking status: Former Smoker    Types: Cigarettes    Quit date: 07/16/2008    Years since quitting: 11.4  . Smokeless tobacco: Never Used  . Tobacco comment: unknown when stopped smoking      long time  Substance and Sexual Activity  . Alcohol use: No  . Drug use: No  . Sexual activity: Not on file  Other Topics Concern  . Not on file  Social History Narrative  . Not on file   Social Determinants of Health   Financial Resource Strain:   . Difficulty of Paying Living Expenses: Not on file  Food Insecurity:   . Worried About Charity fundraiser in the Last Year: Not on file  . Ran Out of Food in the Last Year: Not on file  Transportation Needs:   . Lack of Transportation (Medical): Not on file  . Lack of Transportation (Non-Medical): Not on file  Physical Activity:   . Days of Exercise per Week: Not on file  . Minutes of Exercise per Session: Not on file  Stress:   . Feeling of Stress : Not on file  Social Connections:   . Frequency of Communication with Friends and Family: Not on file  . Frequency of Social Gatherings with Friends and Family: Not on file    . Attends Religious Services: Not on file  . Active Member of Clubs or Organizations: Not on file  . Attends Archivist Meetings: Not on file  . Marital Status: Not on file  Intimate Partner Violence:   . Fear of Current or Ex-Partner: Not on file  . Emotionally Abused: Not on file  . Physically Abused: Not on file  . Sexually Abused: Not on file    FAMILY HISTORY:  Family History  Family history unknown: Yes    CURRENT MEDICATIONS:  Current Outpatient Medications  Medication Sig Dispense Refill  . ACCU-CHEK AVIVA PLUS test strip     . amLODipine (NORVASC) 5 MG tablet Take 5 mg by mouth daily.    [name] aspirin EC 81 MG tablet Take 81 mg by mouth daily.    . calcium carbonate (TUMS) 500 MG chewable tablet Chew 2 tablets (400 mg of elemental calcium total) by mouth 3 (three) times daily. 180 tablet 2  . calcium-vitamin D (OSCAL WITH D) 500-200 MG-UNIT tablet Take 2 tablets by mouth daily with breakfast.    . docusate sodium (COLACE) 100 MG capsule Take 100 mg by mouth 2 (two) times daily.    . ferrous sulfate 325 (65 FE) MG tablet Take by mouth.    . fish oil-omega-3 fatty acids 1000 MG capsule Take 1 g by mouth 2 (two) times daily.    . Fluticasone-Salmeterol (ADVAIR) 250-50 MCG/DOSE AEPB Inhale 1 puff into the lungs every 12 (twelve) hours.    [name] ipratropium-albuterol (DUONEB) 0.5-2.5 (3) MG/3ML SOLN Take 3 mLs by nebulization every 2 (two) hours as needed. Congestion and wheezing    . LANTUS SOLOSTAR 100 UNIT/ML Solostar Pen SMARTSIG:12 Unit(s) SUB-Q Every Night    . loratadine (CLARITIN) 10 MG tablet Take by mouth.    . metFORMIN (GLUCOPHAGE) 1000 MG tablet Take by mouth.    . metoprolol (TOPROL-XL) 200 MG 24 hr tablet Take 200 mg by mouth every evening.     . montelukast (SINGULAIR) 10 MG tablet Take 10 mg by mouth at bedtime.    . mupirocin ointment (BACTROBAN) 2 % SMARTSIG:1 Application Topical 2-3 Times Daily    . NOVOFINE AUTOCOVER 30G X 8 MM MISC USE ONCE DAILY  WITHOINSULIN PEN(S).    Dellia Nims CALCIUM + D3 500-200 MG-UNIT TABS TAKE 2 TABLETS BY MOUTH DAILY WITH BREAKFAST. (Patient taking differently: Take 2 tablets by mouth daily with breakfast. ) 60 tablet 0  . pantoprazole (PROTONIX) 40 MG tablet Take 40 mg by mouth every morning.     . phenytoin (DILANTIN) 100 MG ER capsule Take 100 mg by mouth 2 (two) times daily.    . pravastatin (PRAVACHOL) 40 MG tablet Take 40 mg by mouth every evening.     [name] PROAIR HFA 108 (90 Base) MCG/ACT inhaler Inhale 1 puff into the lungs every 4 (four) hours as needed.     . traZODone (DESYREL) 50 MG tablet Take 50 mg by mouth at bedtime as needed.      No current facility-administered medications for this visit.    ALLERGIES:  No Known Allergies  PHYSICAL EXAM:  Performance status (ECOG): 2 - Symptomatic, <50% confined to bed  Vitals:   01/10/20 1108  BP: (!) 175/71  Pulse: 81  Resp: 20  Temp: (!) 96.8 F (36 C)  SpO2: 95%   Wt Readings from Last 3 Encounters:  01/10/20 259 lb 9.6 oz (117.8 kg)  06/21/19 235 lb 4.8 oz (106.7 kg)  12/01/18 258 lb 4.8 oz (117.2 kg)   Physical Exam   LABORATORY DATA:  I have reviewed the labs as listed.  CBC Latest Ref Rng & Units 12/29/2019 06/03/2019 04/28/2019  WBC 4.0 - 10.5 K/uL 6.2 5.7 6.3  Hemoglobin 12.0 - 15.0 g/dL 10.1(L) 10.3(L) 10.8(L)  Hematocrit 36 - 46 % 32.6(L) 34.1(L) 35.7(L)  Platelets 150 - 400 K/uL 200 184 193   CMP Latest Ref Rng & Units 12/29/2019 06/03/2019 04/28/2019  Glucose 70 -  99 mg/dL 161(H) 92 74  BUN 8 - 23 mg/dL 46(H) 45(H) 45(H)  Creatinine 0.44 - 1.00 mg/dL 1.46(H) 1.72(H) 1.46(H)  Sodium 135 - 145 mmol/L 134(L) 136 136  Potassium 3.5 - 5.1 mmol/L 3.9 5.2(H) 4.5  Chloride 98 - 111 mmol/L 92(L) 98 96(L)  CO2 22 - 32 mmol/L 29 28 28   Calcium 8.9 - 10.3 mg/dL 8.5(L) 8.8(L) 8.8(L)  Total Protein 6.5 - 8.1 g/dL 7.2 7.3 7.7  Total Bilirubin 0.3 - 1.2 mg/dL 0.3 0.5 0.5  Alkaline Phos 38 - 126 U/L 393(H) 458(H) 483(H)  AST 15 - 41 U/L 19  21 26   ALT 0 - 44 U/L 15 19 26    Lab Results  Component Value Date   CEA1 4.9 (H) 12/29/2019   CEA1 5.6 (H) 04/28/2019   CEA1 5.6 (H) 11/04/2018   Lab Results  Component Value Date   TIBC 271 12/29/2019   TIBC 289 06/03/2019   TIBC 272 02/26/2016   FERRITIN 77 12/29/2019   FERRITIN 92 06/03/2019   FERRITIN 109 02/26/2016   IRONPCTSAT 20 12/29/2019   IRONPCTSAT 21 06/03/2019   IRONPCTSAT 27 02/26/2016    DIAGNOSTIC IMAGING:  I have independently reviewed the scans and discussed with the patient. CT Abdomen Pelvis Wo Contrast  Result Date: 12/30/2019 CLINICAL DATA:  History of rectal [name] metastatic to lung, status post chemotherapy and surgery with chronic renal disease. EXAM: CT ABDOMEN AND PELVIS WITHOUT CONTRAST TECHNIQUE: Multidetector CT imaging of the abdomen and pelvis was performed following the standard protocol without IV contrast. COMPARISON:  April 28, 2019 FINDINGS: Lower chest: Basilar atelectasis. No consolidation. No pleural effusion. Architectural distortion at the LEFT lung base related to prior pulmonary resection. Hepatobiliary: Lobular hepatic contours. Limited assessment of liver parenchyma given lack of intravenous contrast but no focal suspicious lesion. No pericholecystic stranding. Pancreas: Pancreas atrophied as before. Spleen: Spleen normal size and contour. Adrenals/Urinary Tract: Adrenal glands with mild thickening of the LEFT adrenal unchanged. RIGHT adrenal thickening is also stable and low-density. No hydronephrosis. Urinary bladder mildly thickened particular posteriorly in the area of post treatment changes in the pelvis. Marked cortical scarring of the LEFT kidney is similar to previous imaging. Stomach/Bowel: No acute gastric or small bowel process. Appendix is normal. Ingested contrast material reaches the distal colon. Signs of LEFT lower quadrant colostomy with redundant colon surrounded by herniated fat, fat herniating through the ostomy site  showing no change from the prior examination. Vascular/Lymphatic: No aneurysmal dilation of the abdominal aorta. There is no gastrohepatic or hepatoduodenal ligament lymphadenopathy. No retroperitoneal or mesenteric lymphadenopathy. No pelvic sidewall lymphadenopathy. Reproductive: Post hysterectomy in the setting of prior APR. Soft tissue in the pelvis at the site of prior surgery. Soft tissue measuring 4.3 x 3.4 cm posterior to the urinary bladder previously 4.3 x 3.8 cm when measured in a similar fashion. Other: Small fat containing umbilical hernia. Musculoskeletal: Diffuse bony sclerosis and coarsening of trabeculation with similar appearance to prior imaging. These findings are little changed dating back to 2017 and favored to represent sequela of Paget's disease. IMPRESSION: 1. Soft tissue in the pelvis at the site of prior surgery. Soft tissue measuring 4.3 x 3.4 cm posterior to the urinary bladder previously 4.3 x 3.8 cm when measured in a similar fashion. Findings likely reflect post treatment changes given stability over time. 2. Signs of LEFT lower quadrant colostomy with redundant colon surrounded by herniated fat, fat herniating through the ostomy site showing no change from the prior examination. 3.  Diffuse bony sclerosis and coarsening of trabeculation with similar appearance to prior imaging. These findings are little changed dating back to 2017 and favored to represent sequela of Paget's disease. 4. Lobular hepatic contours. Correlate with any clinical or laboratory evidence of liver disease. This can be seen following systemic therapy and is not associated with overt signs of portal hypertension, normal size spleen as before. 5. Marked cortical scarring of the LEFT kidney as before. Electronically Signed   By: Zetta Bills M.D.   On: 12/30/2019 15:41     ASSESSMENT:  1.  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 -Left lower lobe lung nodule in August 2016, status post resection confirming metastatic adenocarcinoma. -FOLFOX and Avastin from October 2016 through 06/27/2015. -CEA on 04/29/2019 was 5.6. -CT scan from April 28, 2019 with no evidence of active malignancy. -CTAP on December 30, 2019 showed stable soft tissue swelling in the pelvis from prior treatment.  Diffuse bony sclerosis also stable.  Lobular hepatic contour is consistent with liver disease.  Marked cortical scarring of the left kidney as before.  2.  Vitamin B12 deficiency:  -She had a history of B12 injections monthly which were continued until March 2021.  3.  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   PLAN:  1.  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 -Reviewed CT scan report from December 30, 2019 which showed stable findings. -Reviewed labs from December 29, 2019 with CEA 4.9 and stable. -RTC 6 months with repeat CEA.  2.  Vitamin B12 deficiency: -She stopped B12 injections.  B12 is 240. -Recommended restarting B12 1 mg tablet daily.  3.  Normocytic anemia: -Hemoglobin 10.1.  Ferritin is 77 and percent saturation is 20. -Continue iron tablet daily.    Orders placed this encounter:  No orders of the defined types were placed in this encounter.    Derek Jack, MD [place] [phone]   I, Milinda Antis, am acting as a scribe for Dr. Sanda Linger.  I, Derek Jack MD, have reviewed the above documentation for accuracy and completeness, and I agree with the above.

## 2020-01-13 ENCOUNTER — Ambulatory Visit: Payer: Medicare Other | Admitting: Podiatry

## 2020-01-24 ENCOUNTER — Other Ambulatory Visit (HOSPITAL_COMMUNITY): Payer: Self-pay | Admitting: Surgery

## 2020-01-24 DIAGNOSIS — C7800 Secondary malignant neoplasm of unspecified lung: Secondary | ICD-10-CM

## 2020-01-24 DIAGNOSIS — E538 Deficiency of other specified B group vitamins: Secondary | ICD-10-CM

## 2020-01-24 DIAGNOSIS — C20 Malignant neoplasm of rectum: Secondary | ICD-10-CM

## 2020-01-24 MED ORDER — CYANOCOBALAMIN 1000 MCG PO CAPS: 1.0000 | ORAL_CAPSULE | Freq: Every day | ORAL | 3 refills | Status: DC

## 2020-01-27 ENCOUNTER — Encounter: Payer: Self-pay | Admitting: Podiatry

## 2020-01-27 ENCOUNTER — Ambulatory Visit (INDEPENDENT_AMBULATORY_CARE_PROVIDER_SITE_OTHER): Payer: Medicare Other | Admitting: Podiatry

## 2020-01-27 ENCOUNTER — Other Ambulatory Visit: Payer: Self-pay

## 2020-01-27 DIAGNOSIS — M79675 Pain in left toe(s): Secondary | ICD-10-CM | POA: Diagnosis not present

## 2020-01-27 DIAGNOSIS — E1142 Type 2 diabetes mellitus with diabetic polyneuropathy: Secondary | ICD-10-CM

## 2020-01-27 DIAGNOSIS — M79674 Pain in right toe(s): Secondary | ICD-10-CM

## 2020-01-27 DIAGNOSIS — R609 Edema, unspecified: Secondary | ICD-10-CM | POA: Diagnosis not present

## 2020-01-27 DIAGNOSIS — B351 Tinea unguium: Secondary | ICD-10-CM | POA: Diagnosis not present

## 2020-01-27 NOTE — Progress Notes (Signed)
This patient returns to my office for at risk foot care.  This patient requires this care by a professional since this patient will be at risk due to having diabetic neuropathy, and chronic kidney disease.  Patient has been working on her left big toenail.   This patient is unable to cut nails herself since the patient cannot reach her nails.These nails are painful walking and wearing shoes.  This patient presents for at risk foot care today.  General Appearance  Alert, conversant and in no acute stress.  Vascular  Dorsalis pedis and posterior tibial  pulses are palpable  Bilaterally despite severe swelling both feet/legs..  Capillary return is within normal limits  bilaterally. Temperature is within normal limits  Bilaterally.  Severe swelling  B/L.  Neurologic  Senn-Weinstein monofilament wire test within normal limits  bilaterally. Muscle power within normal limits bilaterally.  Nails Thick disfigured discolored nails with subungual debris  from hallux to fifth toes bilaterally. No evidence of bacterial infection or drainage bilaterally.  Orthopedic  No limitations of motion  feet .  No crepitus or effusions noted.  No bony pathology or digital deformities noted.  Skin  normotropic skin with no porokeratosis noted bilaterally.  No signs of infections or ulcers noted.     Onychomycosis  Pain in right toes  Pain in left toes  Consent was obtained for treatment procedures.   Mechanical debridement of nails 1-5  bilaterally performed with a nail nipper.  Filed with dremel without incident.  Neosporin/DSD applied.  No infection noted.   Return office visit       4 months               Told patient to return for periodic foot care and evaluation due to potential at risk complications.   Gardiner Barefoot DPM

## 2020-05-02 ENCOUNTER — Other Ambulatory Visit (HOSPITAL_COMMUNITY): Payer: Self-pay | Admitting: Hematology

## 2020-05-18 ENCOUNTER — Ambulatory Visit: Payer: Medicare Other | Admitting: Podiatry

## 2020-07-03 ENCOUNTER — Inpatient Hospital Stay (HOSPITAL_COMMUNITY): Payer: Medicare Other

## 2020-07-04 ENCOUNTER — Inpatient Hospital Stay (HOSPITAL_COMMUNITY): Payer: Medicare Other | Attending: Hematology

## 2020-07-04 ENCOUNTER — Other Ambulatory Visit: Payer: Self-pay

## 2020-07-04 DIAGNOSIS — C20 Malignant neoplasm of rectum: Secondary | ICD-10-CM | POA: Insufficient documentation

## 2020-07-04 DIAGNOSIS — E538 Deficiency of other specified B group vitamins: Secondary | ICD-10-CM | POA: Insufficient documentation

## 2020-07-04 DIAGNOSIS — R609 Edema, unspecified: Secondary | ICD-10-CM | POA: Insufficient documentation

## 2020-07-04 DIAGNOSIS — R5383 Other fatigue: Secondary | ICD-10-CM | POA: Insufficient documentation

## 2020-07-04 DIAGNOSIS — Z87891 Personal history of nicotine dependence: Secondary | ICD-10-CM | POA: Insufficient documentation

## 2020-07-04 DIAGNOSIS — G479 Sleep disorder, unspecified: Secondary | ICD-10-CM | POA: Diagnosis not present

## 2020-07-04 DIAGNOSIS — D649 Anemia, unspecified: Secondary | ICD-10-CM | POA: Insufficient documentation

## 2020-07-04 DIAGNOSIS — R519 Headache, unspecified: Secondary | ICD-10-CM | POA: Diagnosis not present

## 2020-07-04 DIAGNOSIS — Z79899 Other long term (current) drug therapy: Secondary | ICD-10-CM | POA: Diagnosis not present

## 2020-07-04 DIAGNOSIS — C7802 Secondary malignant neoplasm of left lung: Secondary | ICD-10-CM | POA: Insufficient documentation

## 2020-07-04 DIAGNOSIS — C7800 Secondary malignant neoplasm of unspecified lung: Secondary | ICD-10-CM

## 2020-07-04 DIAGNOSIS — R059 Cough, unspecified: Secondary | ICD-10-CM | POA: Insufficient documentation

## 2020-07-04 LAB — CBC WITH DIFFERENTIAL/PLATELET
Abs Immature Granulocytes: 0.02 10*3/uL (ref 0.00–0.07)
Basophils Absolute: 0 10*3/uL (ref 0.0–0.1)
Basophils Relative: 1 %
Eosinophils Absolute: 0.5 10*3/uL (ref 0.0–0.5)
Eosinophils Relative: 7 %
HCT: 34.8 % — ABNORMAL LOW (ref 36.0–46.0)
Hemoglobin: 10.5 g/dL — ABNORMAL LOW (ref 12.0–15.0)
Immature Granulocytes: 0 %
Lymphocytes Relative: 14 %
Lymphs Abs: 0.9 10*3/uL (ref 0.7–4.0)
MCH: 27.9 pg (ref 26.0–34.0)
MCHC: 30.2 g/dL (ref 30.0–36.0)
MCV: 92.3 fL (ref 80.0–100.0)
Monocytes Absolute: 0.7 10*3/uL (ref 0.1–1.0)
Monocytes Relative: 11 %
Neutro Abs: 4.5 10*3/uL (ref 1.7–7.7)
Neutrophils Relative %: 67 %
Platelets: 199 10*3/uL (ref 150–400)
RBC: 3.77 MIL/uL — ABNORMAL LOW (ref 3.87–5.11)
RDW: 13.9 % (ref 11.5–15.5)
WBC: 6.6 10*3/uL (ref 4.0–10.5)
nRBC: 0 % (ref 0.0–0.2)

## 2020-07-04 LAB — IRON AND TIBC
Iron: 76 ug/dL (ref 28–170)
Saturation Ratios: 29 % (ref 10.4–31.8)
TIBC: 266 ug/dL (ref 250–450)
UIBC: 190 ug/dL

## 2020-07-04 LAB — COMPREHENSIVE METABOLIC PANEL
ALT: 15 U/L (ref 0–44)
AST: 19 U/L (ref 15–41)
Albumin: 3.6 g/dL (ref 3.5–5.0)
Alkaline Phosphatase: 418 U/L — ABNORMAL HIGH (ref 38–126)
Anion gap: 13 (ref 5–15)
BUN: 54 mg/dL — ABNORMAL HIGH (ref 8–23)
CO2: 28 mmol/L (ref 22–32)
Calcium: 8.5 mg/dL — ABNORMAL LOW (ref 8.9–10.3)
Chloride: 97 mmol/L — ABNORMAL LOW (ref 98–111)
Creatinine, Ser: 1.63 mg/dL — ABNORMAL HIGH (ref 0.44–1.00)
GFR, Estimated: 34 mL/min — ABNORMAL LOW (ref >60)
Glucose, Bld: 89 mg/dL (ref 70–99)
Potassium: 4.1 mmol/L (ref 3.5–5.1)
Sodium: 138 mmol/L (ref 135–145)
Total Bilirubin: 0.2 mg/dL — ABNORMAL LOW (ref 0.3–1.2)
Total Protein: 7.7 g/dL (ref 6.5–8.1)

## 2020-07-04 LAB — FERRITIN: Ferritin: 99 ng/mL (ref 11–307)

## 2020-07-04 LAB — VITAMIN B12: Vitamin B-12: 883 pg/mL (ref 180–914)

## 2020-07-05 LAB — CEA: CEA: 4.7 ng/mL (ref 0.0–4.7)

## 2020-07-10 ENCOUNTER — Other Ambulatory Visit: Payer: Self-pay

## 2020-07-10 ENCOUNTER — Inpatient Hospital Stay (HOSPITAL_BASED_OUTPATIENT_CLINIC_OR_DEPARTMENT_OTHER): Payer: Medicare Other | Admitting: Hematology

## 2020-07-10 VITALS — BP 158/69 | HR 71 | Resp 16 | Wt 254.0 lb

## 2020-07-10 DIAGNOSIS — C20 Malignant neoplasm of rectum: Secondary | ICD-10-CM | POA: Diagnosis not present

## 2020-07-10 DIAGNOSIS — C7800 Secondary malignant neoplasm of unspecified lung: Secondary | ICD-10-CM

## 2020-07-10 NOTE — Patient Instructions (Signed)
[name] at [facility] Discharge Instructions  You were seen today by Dr. Delton Coombes. He went over your recent results. Continue taking the iron tablet and vitamin B12 1 mg tablet daily. You will be scheduled to have a CT scan of your chest and abdomen done before your next visit. Dr. Delton Coombes will see you back in 6 months for labs and follow up.   Thank you for choosing [place] at [facility] to provide your oncology and hematology care.  To afford each patient quality time with our provider, please arrive at least 15 minutes before your scheduled appointment time.   If you have a lab appointment with the [facility] please come in thru the Main Entrance and check in at the main information desk  You need to re-schedule your appointment should you arrive 10 or more minutes late.  We strive to give you quality time with our providers, and arriving late affects you and other patients whose appointments are after yours.  Also, if you no show three or more times for appointments you may be dismissed from the clinic at the providers discretion.     Again, thank you for choosing [facility].  Our hope is that these requests will decrease the amount of time that you wait before being seen by our physicians.       _____________________________________________________________  Should you have questions after your visit to [facility], please contact our office at [phone] between the hours of 8:00 a.m. and 4:30 p.m.  Voicemails left after 4:00 p.m. will not be returned until the following business day.  For prescription refill requests, have your pharmacy contact our office and allow 72 hours.    Cancer Center Support Programs:   > Cancer Support Group  2nd Tuesday of the month 1pm-2pm, Journey Room

## 2020-07-10 NOTE — Progress Notes (Signed)
[name], [place] [id number]   CLINIC:  Medical Oncology/Hematology  PCP:  Sinda Du, MD None None   REASON FOR VISIT:  Follow-up for stage IV rectal cancer  PRIOR THERAPY:  1. Concurrent chemoradiation with Xeloda from 03/25/2013 to 05/05/2013. 2. Abdominoperineal resection and TAH & BSO on 07/26/2013. 3. LLL lung resection in August 2016. 4. FOLFOX and Avastin x 12 cycles from 01/04/2015 to 06/27/2015.  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   03/25/2013 - 05/05/2013 Chemotherapy   Xeloda 1500 mg BID 5 days/week with radiation therapy.   07/26/2013 Definitive Surgery   Dr. Arnoldo Morale- Flexible sigmoidoscopy, abdominoperineal resection, total abdominal hysterectomy with bilateral salpingo-oophorectomy   08/30/2013 - 03/01/2014 Chemotherapy   Xeloda 1000 mg BID 14 days on and 7 days off x 6 months   03/08/2014 Imaging   CT CAP- Bilateral pulmonary nodules, including an 8 mm left lower lobe nodule. Metastatic disease is a concern. The left lower lobe nodule has progressed since 03/05/2013, when it measured 5 mm.   05/26/2014 Imaging   CT Chest- Continued enlargement of the superior segment left lower lobe nodule. Malignancy is likely.   In contrast, the right lower lobe nodule is probably benign.   08/17/2014 Imaging   CT- Superior segment left lower lobe pulmonary nodule measures stable since the most recent comparison study, but is again noted to have increased in size when comparing to older studies. Continued close attention will be required as neoplasm remains a con   10/26/2014 Surgery   thoracoscopic left lower lobe superior segmentectomy. Pathology with metastatic  adenocarcinoma c/w colonic primary   01/04/2015 - 06/27/2015 Chemotherapy   FOLFOX+Avastin (Avastin started on 01/18/2015).   01/09/2015 Imaging   CT CAP- Interval wedge resection of metastasis within the left lower lobe. No evidence of new metastatic disease within the chest, abdomen or pelvis.   02/15/2015 Treatment Plan Change   Treatment deferred due to renal function change (patient poor historian) with N/V.   02/22/2015 Treatment Plan Change   Added Aranesp at renal dosing to supportive therpay plan.   03/08/2015 Treatment Plan Change   Defer treatment x 1 week   04/12/2015 Adverse Reaction   Patient reported mouth sores.  None on exam.     04/12/2015 Treatment Plan Change   5FU bolus is discontinued for "mouth sores"   04/12/2015 Adverse Reaction   Increasing Creatinine Cl.  CrCl calculated to be 30.7.     04/12/2015 Treatment Plan Change   Oxaliplatin dose reduced by 20% to 65 mg/m2 based upon creatinine clearance.   05/24/2015 Treatment Plan Change   5FU CI decreased by 15%.   08/29/2015 Imaging   CT CAP- Status post APR with left lower quadrant colostomy, and some stable presacral scarring. No definite evidence of metastatic disease in the CAP.  There is a new 3 mm nodule in the right lower lobe which is highly nonspecific. Attention on follow-up.   01/03/2016 Imaging   CT chest- No definite findings to suggest metastatic disease to the lungs. Previously noted 3 mm right lower lobe pulmonary nodule stable, favored to be benign.   08/22/2016 Imaging   CT CAP-1. Abdominoperineal resection with stable scarring and left lower quadrant  colostomy. 2. 4 mm right lower lobe nodule, stable. 3. 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 female, returns for routine follow-up of her stage IV rectal cancer. [name] was last seen on 01/10/2020.   Today she is accompanied by her caretaker and she reports feeling okay. She denies having any constipation, abdominal pain or hematochezia and her appetite is excellent. She is taking iron tablet and vitamin B12 tablets daily.   REVIEW OF SYSTEMS:  Review of Systems  Constitutional: Positive for fatigue (50%). Negative for appetite change.  Respiratory: Positive for cough.   Gastrointestinal: Negative for abdominal pain, blood in stool and constipation.  Neurological: Positive for headaches (occasional).  Psychiatric/Behavioral: Positive for sleep disturbance (disrupted).  All other systems reviewed and are negative.   PAST MEDICAL/SURGICAL HISTORY:  Past Medical History:  Diagnosis Date  . Arthritis   . Asthma   . Cancer (HCC)    Rectal   . Chronic renal disease, stage 3, moderately decreased glomerular filtration rate between 30-59 mL/min/1.73 square meter ([place]) 04/27/2014  . Depression   . Diabetes mellitus    years  . GERD (gastroesophageal reflux disease)   . Gout   . History of recurrent UTIs   . Hypertension   . Iron deficiency 04/27/2014  . Mental retardation    stopped school at 9th grade   . Numbness and tingling in hands    x several months   . Rectal cancer ([place])   . Seizures ([place])    more than 4 yrs since last seizure. UNknown etiology  . Shortness of breath    with exertion  . Vitamin B 12 deficiency 02/01/2015   Incidentally found without antibody testing.     Past Surgical History:  Procedure Laterality Date  . ABDOMINAL HYSTERECTOMY    . ABDOMINAL PERINEAL BOWEL RESECTION N/A 07/26/2013   Procedure:  ABDOMINAL PERINEAL RESECTION;  Surgeon: Jamesetta So, MD;  [facility];  Service: General;  Laterality: N/A;  . COLON SURGERY  2015   bowel resection/w colostomy  . COLONOSCOPY N/A 02/04/2013   Procedure: COLONOSCOPY;  Surgeon: Rogene Houston, MD;  [facility];  Service: Endoscopy;  Laterality: N/A;  225  . COLONOSCOPY N/A 05/12/2014   Procedure: COLONOSCOPY;  Surgeon: Rogene Houston, MD;  [facility];  Service: Endoscopy;  Laterality: N/A;  1030  . COLONOSCOPY WITH ESOPHAGOGASTRODUODENOSCOPY (EGD) N/A 01/14/2013   Procedure: COLONOSCOPY WITH ESOPHAGOGASTRODUODENOSCOPY (EGD);  Surgeon: Rogene Houston, MD;  [facility];  Service: Endoscopy;  Laterality: N/A;  250-moved to 315 Ann to notify pt  . COLOSTOMY Left 07/26/2013   Procedure: COLOSTOMY;  Surgeon: Jamesetta So, MD;  [facility]: [facility];  Service: General;  Laterality: Left;  . EUS N/A 02/18/2013   Procedure: LOWER ENDOSCOPIC ULTRASOUND (EUS);  Surgeon: Milus Banister, MD;  [facility];  Service: Endoscopy;  Laterality: N/A;  . FLEXIBLE SIGMOIDOSCOPY N/A 07/26/2013   Procedure: FLEXIBLE SIGMOIDOSCOPY;  Surgeon: Jamesetta So, MD;  [facility]: [facility];  Service: General;  Laterality: N/A;  .  LYMPH NODE DISSECTION Left 10/26/2014   Procedure: LYMPH NODE DISSECTION;  Surgeon: [name], MD;  Location: [facility];  Service: Thoracic;  Laterality: Left;  [name] MULTIPLE EXTRACTIONS WITH ALVEOLOPLASTY N/A 11/23/2012   Procedure: MULTIPLE EXTRACION 1, 2, 4, 5, 6, 7, 8, 9, 10, 11, 12, 13, 14, 17, 18, 20, 23, 24, 25, 26, 28, 29, 32 WITH ALVEOLOPLASTY, REMOVE BILATERAL TORI;  Surgeon: Gae Bon, DDS;  Location: [facility];  Service: Oral Surgery;  Laterality: N/A;  . PORTACATH PLACEMENT Left 01/02/2015   Procedure: INSERTION PORT-A-CATH;  Surgeon: Aviva Signs, MD;  [facility];  Service: General;  Laterality: Left;  . SALPINGOOPHORECTOMY Bilateral 07/26/2013   Procedure: SALPINGO OOPHORECTOMY;  Surgeon: Jamesetta So, MD;  [facility];  Service: General;   Laterality: Bilateral;  . SEGMENTECOMY Left 10/26/2014   Procedure: LEFT LOWER LOBE SUPERIOR SEGMENTECTOMY;  Surgeon: [name], MD;  Location: [facility];  Service: Thoracic;  Laterality: Left;  . SUPRACERVICAL ABDOMINAL HYSTERECTOMY N/A 07/26/2013   Procedure: HYSTERECTOMY SUPRACERVICAL ABDOMINAL ;  Surgeon: Jamesetta So, MD;  [facility];  Service: General;  Laterality: N/A;  . VIDEO ASSISTED THORACOSCOPY Left 10/26/2014   Procedure: LEFT VIDEO ASSISTED THORACOSCOPY;  Surgeon: [name], MD;  Location: [facility];  Service: Thoracic;  Laterality: Left;    SOCIAL HISTORY:  Social History   Socioeconomic History  . Marital status: Single    Spouse name: Not on file  . Number of children: Not on file  . Years of education: Not on file  . Highest education level: Not on file  Occupational History  . Not on file  Tobacco Use  . Smoking status: Former Smoker    Types: Cigarettes    Quit date: 07/16/2008    Years since quitting: 11.9  . Smokeless tobacco: Never Used  . Tobacco comment: unknown when stopped smoking      long time  Substance and Sexual Activity  . Alcohol use: No  . Drug use: No  . Sexual activity: Not on file  Other Topics Concern  . Not on file  Social History Narrative  . Not on file   Social Determinants of Health   Financial Resource Strain: Not on file  Food Insecurity: Not on file  Transportation Needs: Not on file  Physical Activity: Not on file  Stress: Not on file  Social Connections: Not on file  Intimate Partner Violence: Not on file    FAMILY HISTORY:  Family History  Family history unknown: Yes    CURRENT MEDICATIONS:  Current Outpatient Medications  Medication Sig Dispense Refill  . ACCU-CHEK AVIVA PLUS test strip     . amLODipine (NORVASC) 5 MG tablet Take 5 mg by mouth daily.    [name] aspirin EC 81 MG tablet Take 81 mg by mouth daily.    . calcium carbonate (TUMS) 500 MG chewable tablet Chew 2 tablets (400 mg of elemental  calcium total) by mouth 3 (three) times daily. 180 tablet 2  . calcium-vitamin D (OSCAL WITH D) 500-200 MG-UNIT tablet Take 2 tablets by mouth daily with breakfast.    . Cholecalciferol 25 MCG (1000 UT) capsule Take by mouth.    . Cyanocobalamin 1000 MCG CAPS Take 1 capsule by mouth daily. 30 capsule 3  . docusate sodium (COLACE) 100 MG capsule Take 100 mg by mouth 2 (two) times daily.    [name] donepezil (ARICEPT) 5 MG tablet Take 5 mg by mouth daily.    [name]  ferrous sulfate 325 (65 FE) MG tablet Take by mouth.    . fish oil-omega-3 fatty acids 1000 MG capsule Take 1 g by mouth 2 (two) times daily.    . Fluticasone-Salmeterol (ADVAIR) 250-50 MCG/DOSE AEPB Inhale 1 puff into the lungs every 12 (twelve) hours.    . furosemide (LASIX) 40 MG tablet Take 40 mg by mouth 2 (two) times daily.    [name] ipratropium-albuterol (DUONEB) 0.5-2.5 (3) MG/3ML SOLN Take 3 mLs by nebulization every 2 (two) hours as needed. Congestion and wheezing    . Lancets Misc. (UNISTIK 3 COMFORT) MISC     . LANTUS SOLOSTAR 100 UNIT/ML Solostar Pen SMARTSIG:12 Unit(s) SUB-Q Every Night    . loratadine (CLARITIN) 10 MG tablet Take by mouth.    . metFORMIN (GLUCOPHAGE) 1000 MG tablet Take by mouth.    . metoprolol (TOPROL-XL) 200 MG 24 hr tablet Take 200 mg by mouth every evening.     . montelukast (SINGULAIR) 10 MG tablet Take 10 mg by mouth at bedtime.    . mupirocin ointment (BACTROBAN) 2 % SMARTSIG:1 Application Topical 2-3 Times Daily    . NOVOFINE AUTOCOVER 30G X 8 MM MISC USE ONCE DAILY WITHOINSULIN PEN(S).    [name] nystatin (MYCOSTATIN/NYSTOP) powder SMARTSIG:1 Application Topical 2-3 Times Daily    . OS-CAL CALCIUM + D3 500-200 MG-UNIT TABS TAKE 2 TABLETS BY MOUTH DAILY WITH BREAKFAST. 60 tablet 0  . pantoprazole (PROTONIX) 40 MG tablet Take 40 mg by mouth every morning.     . phenytoin (DILANTIN) 100 MG ER capsule Take 100 mg by mouth 2 (two) times daily.    . pravastatin (PRAVACHOL) 40 MG tablet Take 40 mg by mouth every evening.      [name] PROAIR HFA 108 (90 Base) MCG/ACT inhaler Inhale 1 puff into the lungs every 4 (four) hours as needed.     . traZODone (DESYREL) 50 MG tablet Take 50 mg by mouth at bedtime as needed.     . triamcinolone cream (KENALOG) 0.1 % SMARTSIG:1 Application Topical 2-3 Times Daily    . vitamin B-12 (CYANOCOBALAMIN) 1000 MCG tablet TAKE (1) TABLET BY MOUTH ONCE DAILY. 30 tablet 6   No current facility-administered medications for this visit.    ALLERGIES:  No Known Allergies  PHYSICAL EXAM:  Performance status (ECOG): 2 - Symptomatic, <50% confined to bed  Vitals:   07/10/20 1214  BP: (!) 158/69  Pulse: 71  Resp: 16  SpO2: 97%   Wt Readings from Last 3 Encounters:  07/10/20 254 lb (115.2 kg)  01/10/20 259 lb 9.6 oz (117.8 kg)  06/21/19 235 lb 4.8 oz (106.7 kg)   Physical Exam Vitals reviewed.  Constitutional:      Appearance: Normal appearance. She is obese.     Comments: In wheelchair  Cardiovascular:     Rate and Rhythm: Normal rate and regular rhythm.     Pulses: Normal pulses.     Heart sounds: Normal heart sounds.  Pulmonary:     Effort: Pulmonary effort is normal.     Breath sounds: Normal breath sounds.  Musculoskeletal:     Right lower leg: Edema (chronic lymphedema) present.     Left lower leg: Edema (chronic lymphedema) present.  Neurological:     General: No focal deficit present.     Mental Status: She is alert and oriented to person, place, and time.  Psychiatric:        Mood and Affect: Mood normal.  Behavior: Behavior normal.      LABORATORY DATA:  I have reviewed the labs as listed.  CBC Latest Ref Rng & Units 07/04/2020 12/29/2019 06/03/2019  WBC 4.0 - 10.5 K/uL 6.6 6.2 5.7  Hemoglobin 12.0 - 15.0 g/dL 10.5(L) 10.1(L) 10.3(L)  Hematocrit 36.0 - 46.0 % 34.8(L) 32.6(L) 34.1(L)  Platelets 150 - 400 K/uL 199 200 184   CMP Latest Ref Rng & Units 07/04/2020 12/29/2019 06/03/2019  Glucose 70 - 99 mg/dL 89 161(H) 92  BUN 8 - 23 mg/dL 54(H) 46(H) 45(H)   Creatinine 0.44 - 1.00 mg/dL 1.63(H) 1.46(H) 1.72(H)  Sodium 135 - 145 mmol/L 138 134(L) 136  Potassium 3.5 - 5.1 mmol/L 4.1 3.9 5.2(H)  Chloride 98 - 111 mmol/L 97(L) 92(L) 98  CO2 22 - 32 mmol/L $RemoveB'28 29 28  'NmGEOxnG$ Calcium 8.9 - 10.3 mg/dL 8.5(L) 8.5(L) 8.8(L)  Total Protein 6.5 - 8.1 g/dL 7.7 7.2 7.3  Total Bilirubin 0.3 - 1.2 mg/dL 0.2(L) 0.3 0.5  Alkaline Phos 38 - 126 U/L 418(H) 393(H) 458(H)  AST 15 - 41 U/L $Remo'19 19 21  'wnYXq$ ALT 0 - 44 U/L $Remo'15 15 19   'EVMmn$ Lab Results  Component Value Date   TIBC 266 07/04/2020   TIBC 271 12/29/2019   TIBC 289 06/03/2019   FERRITIN 99 07/04/2020   FERRITIN 77 12/29/2019   FERRITIN 92 06/03/2019   IRONPCTSAT 29 07/04/2020   IRONPCTSAT 20 12/29/2019   IRONPCTSAT 21 06/03/2019   Lab Results  Component Value Date   CEA1 4.7 07/04/2020   CEA1 4.9 (H) 12/29/2019   CEA1 5.6 (H) 04/28/2019    DIAGNOSTIC IMAGING:  I have independently reviewed the scans and discussed with the patient. No results found.   ASSESSMENT:  1. 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 -Left lower lobe lung nodule in August 2016, status post resection confirming metastatic adenocarcinoma. -FOLFOX and Avastin from October 2016 through 06/27/2015. -CEA on 04/29/2019 was 5.6. -CT scan from April 28, 2019 with no evidence of active malignancy. -CTAP on December 30, 2019 showed stable soft tissue swelling in the pelvis from prior treatment.  Diffuse bony sclerosis also stable.  Lobular hepatic contour is consistent with liver disease.  Marked cortical scarring of the left kidney as before.  2. Vitamin B12 deficiency:  -She had a history of B12 injections monthly which were continued until March 2021.  3. 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   PLAN:  1. Stage IV adenocarcinoma the rectum  with oligometastatic disease to the lung: -Does not report any change in bowel habits or new onset pains. - Last CTAP on 12/30/2019 did not show any evidence of metastatic disease. - Reviewed labs from 07/04/2020 with CEA of 4.7.  LFTs are normal except elevated alk phos of 418 which was stable. - Recommend follow-up in 6 months with CT CAP without contrast, CEA, ferritin, iron panel, B12 and routine labs.  2. Vitamin B12 deficiency: -Vitamin B12 injections were stopped previously. - She is not sure whether she is taking oral B12.  B12 level is 883.  3. Normocytic anemia: - Anemia from CKD and iron deficiency. -Hemoglobin is 10.5.  Ferritin is 99 and percent saturation 29. - Continue iron tablet daily.   Orders placed this encounter:  No orders of the defined types were placed in this encounter.    [facility]  Delton Co[place]es, [place] [phone]   I, Milinda Antis, am acting as a scribe for Dr. Sanda Linger.  I, Derek Jack MD, have reviewed the above documentation for accuracy and completeness, and I agree with the above.

## 2020-07-20 ENCOUNTER — Ambulatory Visit: Payer: Medicare Other | Admitting: Podiatry

## 2020-08-21 ENCOUNTER — Encounter: Payer: Self-pay | Admitting: Podiatry

## 2020-08-21 ENCOUNTER — Ambulatory Visit (INDEPENDENT_AMBULATORY_CARE_PROVIDER_SITE_OTHER): Payer: Medicare Other | Admitting: Podiatry

## 2020-08-21 ENCOUNTER — Other Ambulatory Visit: Payer: Self-pay

## 2020-08-21 DIAGNOSIS — R609 Edema, unspecified: Secondary | ICD-10-CM

## 2020-08-21 DIAGNOSIS — E1142 Type 2 diabetes mellitus with diabetic polyneuropathy: Secondary | ICD-10-CM

## 2020-08-21 DIAGNOSIS — B351 Tinea unguium: Secondary | ICD-10-CM | POA: Diagnosis not present

## 2020-08-21 DIAGNOSIS — M79675 Pain in left toe(s): Secondary | ICD-10-CM | POA: Diagnosis not present

## 2020-08-21 DIAGNOSIS — M79674 Pain in right toe(s): Secondary | ICD-10-CM

## 2020-08-21 NOTE — Progress Notes (Signed)
This patient returns to my office for at risk foot care.  This patient requires this care by a professional since this patient will be at risk due to having diabetic neuropathy, and chronic kidney disease.  Patient has been working on her left big toenail.    This patient is unable to cut nails herself since the patient cannot reach her nails.These nails are painful walking and wearing shoes.  This patient presents for at risk foot care today.  General Appearance  Alert, conversant and in no acute stress.  Vascular  Dorsalis pedis and posterior tibial  pulses are absen Bilaterally despite due to  swelling both feet/legs..  Capillary return is within normal limits  bilaterally. Temperature is within normal limits  Bilaterally.  Severe swelling  B/L.  Neurologic  Senn-Weinstein monofilament wire test diminished  bilaterally. Muscle power within normal limits bilaterally.  Nails Thick disfigured discolored nails with subungual debris  from hallux to fifth toes bilaterally. No evidence of bacterial infection or drainage bilaterally.  Orthopedic  No limitations of motion  feet .  No crepitus or effusions noted.  No bony pathology or digital deformities noted.  Skin  normotropic skin with no porokeratosis noted bilaterally.  No signs of infections or ulcers noted.     Onychomycosis  Pain in right toes  Pain in left toes  Consent was obtained for treatment procedures.   Mechanical debridement of nails 1-5  bilaterally performed with a nail nipper.  Filed with dremel without incident.  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   Return office visit       4 months               Told patient to return for periodic foot care and evaluation due to potential at risk complications.   Gardiner Barefoot DPM

## 2020-09-27 ENCOUNTER — Ambulatory Visit: Payer: Medicare Other

## 2020-11-23 ENCOUNTER — Other Ambulatory Visit: Payer: Self-pay

## 2020-11-23 ENCOUNTER — Encounter: Payer: Self-pay | Admitting: Podiatry

## 2020-11-23 ENCOUNTER — Ambulatory Visit (INDEPENDENT_AMBULATORY_CARE_PROVIDER_SITE_OTHER): Payer: Medicare Other | Admitting: Podiatry

## 2020-11-23 DIAGNOSIS — E1142 Type 2 diabetes mellitus with diabetic polyneuropathy: Secondary | ICD-10-CM | POA: Diagnosis not present

## 2020-11-23 DIAGNOSIS — M79675 Pain in left toe(s): Secondary | ICD-10-CM | POA: Diagnosis not present

## 2020-11-23 DIAGNOSIS — B351 Tinea unguium: Secondary | ICD-10-CM

## 2020-11-23 DIAGNOSIS — M79674 Pain in right toe(s): Secondary | ICD-10-CM

## 2020-11-23 DIAGNOSIS — R609 Edema, unspecified: Secondary | ICD-10-CM | POA: Diagnosis not present

## 2020-11-23 NOTE — Progress Notes (Signed)
This patient returns to my office for at risk foot care.  This patient requires this care by a professional since this patient will be at risk due to having diabetic neuropathy, and chronic kidney disease.  Patient has been working on her left big toenail.    This patient is unable to cut nails herself since the patient cannot reach her nails.These nails are painful walking and wearing shoes.  This patient presents for at risk foot care today.  General Appearance  Alert, conversant and in no acute stress.  Vascular  Dorsalis pedis and posterior tibial  pulses are absen Bilaterally despite due to  swelling both feet/legs..  Capillary return is within normal limits  bilaterally. Temperature is within normal limits  Bilaterally.  Severe swelling  B/L.  Neurologic  Senn-Weinstein monofilament wire test diminished  bilaterally. Muscle power within normal limits bilaterally.  Nails Thick disfigured discolored nails with subungual debris  from hallux to fifth toes bilaterally. No evidence of bacterial infection or drainage bilaterally.  Orthopedic  No limitations of motion  feet .  No crepitus or effusions noted.  No bony pathology or digital deformities noted.  Skin  normotropic skin with no porokeratosis noted bilaterally.  No signs of infections or ulcers noted.     Onychomycosis  Pain in right toes  Pain in left toes  Consent was obtained for treatment procedures.   Mechanical debridement of nails 1-5  bilaterally performed with a nail nipper.  Filed with dremel without incident.  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   Return office visit       4 months               Told patient to return for periodic foot care and evaluation due to potential at risk complications.   Gardiner Barefoot DPM

## 2020-12-06 ENCOUNTER — Telehealth: Payer: Self-pay | Admitting: *Deleted

## 2020-12-06 ENCOUNTER — Other Ambulatory Visit: Payer: Self-pay | Admitting: *Deleted

## 2020-12-06 MED ORDER — DOXYCYCLINE HYCLATE 100 MG PO TABS: 100.0000 mg | ORAL_TABLET | Freq: Two times a day (BID) | ORAL | 0 refills | Status: DC

## 2020-12-06 NOTE — Telephone Encounter (Signed)
Please advise 

## 2020-12-06 NOTE — Telephone Encounter (Signed)
"  [name] was in to see Dr. Prudence Davidson about a week or two ago.  She pulled a toenail back and her toe is infected.  I want to know if he can give her a prescription for the infection."

## 2020-12-06 NOTE — Telephone Encounter (Signed)
Prescription has been sent to pharmacy for Doxycycline

## 2021-01-01 ENCOUNTER — Inpatient Hospital Stay (HOSPITAL_COMMUNITY): Payer: Medicare Other | Attending: Hematology

## 2021-01-01 ENCOUNTER — Other Ambulatory Visit: Payer: Self-pay

## 2021-01-01 ENCOUNTER — Ambulatory Visit (HOSPITAL_COMMUNITY)
Admission: RE | Admit: 2021-01-01 | Discharge: 2021-01-01 | Disposition: A | Discharge status: Home / Self Care | Payer: Medicare Other | Source: Ambulatory Visit | Attending: Hematology | Admitting: Hematology

## 2021-01-01 DIAGNOSIS — C7800 Secondary malignant neoplasm of unspecified lung: Secondary | ICD-10-CM

## 2021-01-01 DIAGNOSIS — N189 Chronic kidney disease, unspecified: Secondary | ICD-10-CM | POA: Insufficient documentation

## 2021-01-01 DIAGNOSIS — C20 Malignant neoplasm of rectum: Secondary | ICD-10-CM | POA: Diagnosis present

## 2021-01-01 DIAGNOSIS — D509 Iron deficiency anemia, unspecified: Secondary | ICD-10-CM | POA: Insufficient documentation

## 2021-01-01 DIAGNOSIS — Z85048 Personal history of other malignant neoplasm of rectum, rectosigmoid junction, and anus: Secondary | ICD-10-CM | POA: Insufficient documentation

## 2021-01-01 DIAGNOSIS — Z79899 Other long term (current) drug therapy: Secondary | ICD-10-CM | POA: Insufficient documentation

## 2021-01-01 DIAGNOSIS — D631 Anemia in chronic kidney disease: Secondary | ICD-10-CM | POA: Insufficient documentation

## 2021-01-01 DIAGNOSIS — E538 Deficiency of other specified B group vitamins: Secondary | ICD-10-CM | POA: Insufficient documentation

## 2021-01-01 LAB — CBC WITH DIFFERENTIAL/PLATELET
Abs Immature Granulocytes: 0.02 10*3/uL (ref 0.00–0.07)
Basophils Absolute: 0 10*3/uL (ref 0.0–0.1)
Basophils Relative: 0 %
Eosinophils Absolute: 0.3 10*3/uL (ref 0.0–0.5)
Eosinophils Relative: 5 %
HCT: 33.2 % — ABNORMAL LOW (ref 36.0–46.0)
Hemoglobin: 10.3 g/dL — ABNORMAL LOW (ref 12.0–15.0)
Immature Granulocytes: 0 %
Lymphocytes Relative: 12 %
Lymphs Abs: 0.8 10*3/uL (ref 0.7–4.0)
MCH: 28.4 pg (ref 26.0–34.0)
MCHC: 31 g/dL (ref 30.0–36.0)
MCV: 91.5 fL (ref 80.0–100.0)
Monocytes Absolute: 0.4 10*3/uL (ref 0.1–1.0)
Monocytes Relative: 6 %
Neutro Abs: 4.9 10*3/uL (ref 1.7–7.7)
Neutrophils Relative %: 77 %
Platelets: 198 10*3/uL (ref 150–400)
RBC: 3.63 MIL/uL — ABNORMAL LOW (ref 3.87–5.11)
RDW: 14.4 % (ref 11.5–15.5)
WBC: 6.4 10*3/uL (ref 4.0–10.5)
nRBC: 0 % (ref 0.0–0.2)

## 2021-01-01 LAB — COMPREHENSIVE METABOLIC PANEL
ALT: 16 U/L (ref 0–44)
AST: 17 U/L (ref 15–41)
Albumin: 3.7 g/dL (ref 3.5–5.0)
Alkaline Phosphatase: 475 U/L — ABNORMAL HIGH (ref 38–126)
Anion gap: 9 (ref 5–15)
BUN: 58 mg/dL — ABNORMAL HIGH (ref 8–23)
CO2: 29 mmol/L (ref 22–32)
Calcium: 8.9 mg/dL (ref 8.9–10.3)
Chloride: 95 mmol/L — ABNORMAL LOW (ref 98–111)
Creatinine, Ser: 1.87 mg/dL — ABNORMAL HIGH (ref 0.44–1.00)
GFR, Estimated: 29 mL/min — ABNORMAL LOW (ref >60)
Glucose, Bld: 84 mg/dL (ref 70–99)
Potassium: 5 mmol/L (ref 3.5–5.1)
Sodium: 133 mmol/L — ABNORMAL LOW (ref 135–145)
Total Bilirubin: 0.2 mg/dL — ABNORMAL LOW (ref 0.3–1.2)
Total Protein: 7.8 g/dL (ref 6.5–8.1)

## 2021-01-01 LAB — IRON AND TIBC
Iron: 82 ug/dL (ref 28–170)
Saturation Ratios: 28 % (ref 10.4–31.8)
TIBC: 290 ug/dL (ref 250–450)
UIBC: 208 ug/dL

## 2021-01-01 LAB — VITAMIN B12: Vitamin B-12: 1034 pg/mL — ABNORMAL HIGH (ref 180–914)

## 2021-01-01 LAB — FERRITIN: Ferritin: 84 ng/mL (ref 11–307)

## 2021-01-03 LAB — CEA: CEA: 6.4 ng/mL — ABNORMAL HIGH (ref 0.0–4.7)

## 2021-01-09 ENCOUNTER — Telehealth: Payer: Self-pay | Admitting: *Deleted

## 2021-01-09 NOTE — Telephone Encounter (Signed)
error 

## 2021-01-09 NOTE — Progress Notes (Shared)
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   03/25/2013 - 05/05/2013 Chemotherapy   Xeloda 1500 mg BID 5 days/week with radiation therapy.   07/26/2013 Definitive Surgery   Dr. Arnoldo Morale- Flexible sigmoidoscopy, abdominoperineal resection, total abdominal hysterectomy with bilateral salpingo-oophorectomy   08/30/2013 - 03/01/2014 Chemotherapy   Xeloda 1000 mg BID 14 days on and 7 days off x 6 months   03/08/2014 Imaging   CT CAP- Bilateral pulmonary nodules, including an 8 mm left lower lobe nodule. Metastatic disease is a concern. The left lower lobe nodule has progressed since 03/05/2013, when it measured 5 mm.   05/26/2014 Imaging   CT Chest- Continued enlargement of the superior segment left lower lobe nodule. Malignancy is likely.   In contrast, the right lower lobe nodule is probably benign.   08/17/2014 Imaging   CT- Superior segment left lower lobe pulmonary nodule measures stable since the most recent comparison study, but is again noted to have increased in size when comparing to older studies. Continued close attention will be required as neoplasm remains a con   10/26/2014 Surgery   thoracoscopic left lower lobe superior segmentectomy. Pathology with metastatic adenocarcinoma c/w colonic primary   01/04/2015 - 06/27/2015 Chemotherapy   FOLFOX+Avastin (Avastin started on 01/18/2015).   01/09/2015 Imaging   CT CAP- Interval wedge resection of metastasis within the left lower lobe. No  evidence of new metastatic disease within the chest, abdomen or pelvis.   02/15/2015 Treatment Plan Change   Treatment deferred due to renal function change (patient poor historian) with N/V.   02/22/2015 Treatment Plan Change   Added Aranesp at renal dosing to supportive therpay plan.   03/08/2015 Treatment Plan Change   Defer treatment x 1 week   04/12/2015 Adverse Reaction   Patient reported mouth sores.  None on exam.     04/12/2015 Treatment Plan Change   5FU bolus is discontinued for "mouth sores"   04/12/2015 Adverse Reaction   Increasing Creatinine Cl.  CrCl calculated to be 30.7.     04/12/2015 Treatment Plan Change   Oxaliplatin dose reduced by 20% to 65 mg/m2 based upon creatinine clearance.   05/24/2015 Treatment Plan Change   5FU CI decreased by 15%.   08/29/2015 Imaging   CT CAP- Status post APR with left lower quadrant colostomy, and some stable presacral scarring. No definite evidence of metastatic disease in the CAP.  There is a new 3 mm nodule in the right lower lobe which is highly nonspecific. Attention on follow-up.   01/03/2016 Imaging   CT chest- No definite findings to suggest metastatic disease to the lungs. Previously noted 3 mm right lower lobe pulmonary nodule stable, favored to be benign.   08/22/2016 Imaging   CT CAP-1. Abdominoperineal resection with stable scarring and left lower quadrant colostomy. 2. 4 mm right lower lobe nodule, stable. 3. 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     CANCER STAGING: Cancer Staging Rectal cancer metastasized  to lung [facility]) Staging form: Colon and Rectum, AJCC 7th Edition - Clinical stage from 02/04/2013: Stage IIA (T3, N0, M0) - Signed by Baird Cancer, PA-C on 01/09/2015 - Clinical stage from 10/26/2014: Stage IVA (T3, N0, M1a) - Signed by Baird Cancer, PA-C on 01/09/2015   INTERVAL HISTORY:  [name], a 70 y.o. female, returns for routine follow-up of her  ***. [name] was last seen on {XX/XX/XXXX}.    REVIEW OF SYSTEMS:  Review of Systems - Oncology  PAST MEDICAL/SURGICAL HISTORY:  Past Medical History:  Diagnosis Date   Arthritis    Asthma    Cancer ([place])    Rectal    Chronic renal disease, stage 3, moderately decreased glomerular filtration rate between 30-59 mL/min/1.73 square meter ([place]) 04/27/2014   Depression    Diabetes mellitus    years   GERD (gastroesophageal reflux disease)    Gout    History of recurrent UTIs    Hypertension    Iron deficiency 04/27/2014   Mental retardation    stopped school at 9th grade    Numbness and tingling in hands    x several months    Rectal cancer ([place])    Seizures ([place])    more than 4 yrs since last seizure. UNknown etiology   Shortness of breath    with exertion   Vitamin B 12 deficiency 02/01/2015   Incidentally found without antibody testing.     Past Surgical History:  Procedure Laterality Date   ABDOMINAL HYSTERECTOMY     ABDOMINAL PERINEAL BOWEL RESECTION N/A 07/26/2013   Procedure:  ABDOMINAL PERINEAL RESECTION;  Surgeon: Jamesetta So, MD;  Location[facility];  Service: General;  Laterality: N/A;   COLON SURGERY  2015   bowel resection/w colostomy   COLONOSCOPY N/A 02/04/2013   Procedure: COLONOSCOPY;  Surgeon: Rogene Houston, MD;  [facility];  Service: Endoscopy;  Laterality: N/A;  225   COLONOSCOPY N/A 05/12/2014   Procedure: COLONOSCOPY;  Surgeon: Rogene Houston, MD;  [facility];  Service: Endoscopy;  Laterality: N/A;  1030   COLONOSCOPY WITH ESOPHAGOGASTRODUODENOSCOPY (EGD) N/A 01/14/2013   Procedure: COLONOSCOPY WITH ESOPHAGOGASTRODUODENOSCOPY (EGD);  Surgeon: Rogene Houston, MD;  [facility];  Service: Endoscopy;  Laterality: N/A;  250-moved to 315 Ann to notify pt   COLOSTOMY Left 07/26/2013   Procedure: COLOSTOMY;  Surgeon: Jamesetta So, MD;  Location: [facility];  Service: General;  Laterality: Left;   EUS N/A 02/18/2013    Procedure: LOWER ENDOSCOPIC ULTRASOUND (EUS);  Surgeon: Milus Banister, MD;  Location: [facility];  Service: Endoscopy;  Laterality: N/A;   FLEXIBLE SIGMOIDOSCOPY N/A 07/26/2013   Procedure: FLEXIBLE SIGMOIDOSCOPY;  Surgeon: Jamesetta So, MD;  Location: [facility];  Service: General;  Laterality: N/A;   LYMPH NODE DISSECTION Left 10/26/2014   Procedure: LYMPH NODE DISSECTION;  Surgeon: [name], MD;  Location: [facility];  Service: Thoracic;  Laterality: Left;   MULTIPLE EXTRACTIONS WITH ALVEOLOPLASTY N/A 11/23/2012   Procedure: MULTIPLE EXTRACION 1, 2, 4, 5, 6, 7, 8, 9, 10, 11, 12, 13, 14, 17, 18, 20, 23, 24, 25, 26, 28, 29, 32 WITH ALVEOLOPLASTY, REMOVE BILATERAL TORI;  Surgeon: Gae Bon, DDS;  Location: [facility];  Service: Oral Surgery;  Laterality: N/A;   PORTACATH PLACEMENT Left 01/02/2015   Procedure: INSERTION PORT-A-CATH;  Surgeon: Aviva Signs, MD;  [facility];  Service: General;  Laterality: Left;   SALPINGOOPHORECTOMY Bilateral 07/26/2013  Procedure: SALPINGO OOPHORECTOMY;  Surgeon: Jamesetta So, MD;  Location[facility];  Service: General;  Laterality: Bilateral;   SEGMENTECOMY Left 10/26/2014   Procedure: LEFT LOWER LOBE SUPERIOR SEGMENTECTOMY;  Surgeon: [name], MD;  Location: [facility];  Service: Thoracic;  Laterality: Left;   SUPRACERVICAL ABDOMINAL HYSTERECTOMY N/A 07/26/2013   Procedure: HYSTERECTOMY SUPRACERVICAL ABDOMINAL ;  Surgeon: Jamesetta So, MD;  [facility];  Service: General;  Laterality: N/A;   VIDEO ASSISTED THORACOSCOPY Left 10/26/2014   Procedure: LEFT VIDEO ASSISTED THORACOSCOPY;  Surgeon: [name], MD;  Location: [facility];  Service: Thoracic;  Laterality: Left;    SOCIAL HISTORY:  Social History   Socioeconomic History   Marital status: Single    Spouse name: Not on file   Number of children: Not on file   Years of education: Not on file   Highest education level: Not on file  Occupational History   Not on file  Tobacco  Use   Smoking status: Former    Types: Cigarettes    Quit date: 07/16/2008    Years since quitting: 12.4   Smokeless tobacco: Never   Tobacco comments:    unknown when stopped smoking      long time  Substance and Sexual Activity   Alcohol use: No   Drug use: No   Sexual activity: Not on file  Other Topics Concern   Not on file  Social History Narrative   Not on file   Social Determinants of Health   Financial Resource Strain: Not on file  Food Insecurity: Not on file  Transportation Needs: Not on file  Physical Activity: Not on file  Stress: Not on file  Social Connections: Not on file  Intimate Partner Violence: Not on file    FAMILY HISTORY:  Family History  Family history unknown: Yes    CURRENT MEDICATIONS:  Current Outpatient Medications  Medication Sig Dispense Refill   ACCU-CHEK AVIVA PLUS test strip      allopurinol (ZYLOPRIM) 100 MG tablet Take by mouth.     amLODipine (NORVASC) 5 MG tablet Take 5 mg by mouth daily.     aspirin EC 81 MG tablet Take 81 mg by mouth daily.     calcium carbonate (TUMS) 500 MG chewable tablet Chew 2 tablets (400 mg of elemental calcium total) by mouth 3 (three) times daily. 180 tablet 2   calcium-vitamin D (OSCAL WITH D) 500-200 MG-UNIT tablet Take 2 tablets by mouth daily with breakfast.     colchicine 0.6 MG tablet Take 0.6 mg by mouth 2 (two) times daily.     Cyanocobalamin 1000 MCG CAPS Take 1 capsule by mouth daily. 30 capsule 3   docusate sodium (COLACE) 100 MG capsule Take 100 mg by mouth 2 (two) times daily.     donepezil (ARICEPT) 5 MG tablet Take 5 mg by mouth daily.     doxycycline (VIBRA-TABS) 100 MG tablet Take 1 tablet (100 mg total) by mouth 2 (two) times daily. 20 tablet 0   ferrous sulfate 325 (65 FE) MG tablet Take by mouth.     fish oil-omega-3 fatty acids 1000 MG capsule Take 1 g by mouth 2 (two) times daily.     Fluticasone-Salmeterol (ADVAIR) 250-50 MCG/DOSE AEPB Inhale 1 puff into the lungs every 12 (twelve)  hours.     furosemide (LASIX) 40 MG tablet Take 40 mg by mouth 2 (two) times daily.     ipratropium-albuterol (DUONEB) 0.5-2.5 (3) MG/3ML SOLN Take  3 mLs by nebulization every 2 (two) hours as needed. Congestion and wheezing     Lancets Misc. (UNISTIK 3 COMFORT) MISC      LANTUS SOLOSTAR 100 UNIT/ML Solostar Pen SMARTSIG:12 Unit(s) SUB-Q Every Night     loratadine (CLARITIN) 10 MG tablet Take by mouth.     metFORMIN (GLUCOPHAGE) 1000 MG tablet Take by mouth.     metoprolol (TOPROL-XL) 200 MG 24 hr tablet Take 200 mg by mouth every evening.      montelukast (SINGULAIR) 10 MG tablet Take 10 mg by mouth at bedtime.     mupirocin ointment (BACTROBAN) 2 % SMARTSIG:1 Application Topical 2-3 Times Daily     naproxen (NAPROSYN) 375 MG tablet Take 375 mg by mouth 2 (two) times daily.     NOVOFINE AUTOCOVER 30G X 8 MM MISC USE ONCE DAILY WITHOINSULIN PEN(S).     nystatin (MYCOSTATIN/NYSTOP) powder SMARTSIG:1 Application Topical 2-3 Times Daily     OS-CAL CALCIUM + D3 500-200 MG-UNIT TABS TAKE 2 TABLETS BY MOUTH DAILY WITH BREAKFAST. 60 tablet 0   pantoprazole (PROTONIX) 40 MG tablet Take 40 mg by mouth every morning.      phenytoin (DILANTIN) 100 MG ER capsule Take 100 mg by mouth 2 (two) times daily.     pravastatin (PRAVACHOL) 40 MG tablet Take 40 mg by mouth every evening.      PROAIR HFA 108 (90 Base) MCG/ACT inhaler Inhale 1 puff into the lungs every 4 (four) hours as needed.      traZODone (DESYREL) 50 MG tablet Take 50 mg by mouth at bedtime as needed.      triamcinolone cream (KENALOG) 0.1 % SMARTSIG:1 Application Topical 2-3 Times Daily     vitamin B-12 (CYANOCOBALAMIN) 1000 MCG tablet TAKE (1) TABLET BY MOUTH ONCE DAILY. 30 tablet 6   No current facility-administered medications for this visit.    ALLERGIES:  No Known Allergies  PHYSICAL EXAM:  Performance status (ECOG): {CHL ONC OZ:[id number]}  There were no vitals filed for this visit. Wt Readings from Last 3 Encounters:  07/10/20  254 lb (115.2 kg)  01/10/20 259 lb 9.6 oz (117.8 kg)  06/21/19 235 lb 4.8 oz (106.7 kg)   Physical Exam   LABORATORY DATA:  I have reviewed the labs as listed.  CBC Latest Ref Rng & Units 01/01/2021 07/04/2020 12/29/2019  WBC 4.0 - 10.5 K/uL 6.4 6.6 6.2  Hemoglobin 12.0 - 15.0 g/dL 10.3(L) 10.5(L) 10.1(L)  Hematocrit 36.0 - 46.0 % 33.2(L) 34.8(L) 32.6(L)  Platelets 150 - 400 K/uL 198 199 200   CMP Latest Ref Rng & Units 01/01/2021 07/04/2020 12/29/2019  Glucose 70 - 99 mg/dL 84 89 161(H)  BUN 8 - 23 mg/dL 58(H) 54(H) 46(H)  Creatinine 0.44 - 1.00 mg/dL 1.87(H) 1.63(H) 1.46(H)  Sodium 135 - 145 mmol/L 133(L) 138 134(L)  Potassium 3.5 - 5.1 mmol/L 5.0 4.1 3.9  Chloride 98 - 111 mmol/L 95(L) 97(L) 92(L)  CO2 22 - 32 mmol/L 29 28 29   Calcium 8.9 - 10.3 mg/dL 8.9 8.5(L) 8.5(L)  Total Protein 6.5 - 8.1 g/dL 7.8 7.7 7.2  Total Bilirubin 0.3 - 1.2 mg/dL 0.2(L) 0.2(L) 0.3  Alkaline Phos 38 - 126 U/L 475(H) 418(H) 393(H)  AST 15 - 41 U/L 17 19 19   ALT 0 - 44 U/L 16 15 15     DIAGNOSTIC IMAGING:  I have independently reviewed the scans and discussed with the patient. CT CHEST ABDOMEN PELVIS WO CONTRAST  Result Date: 01/02/2021 CLINICAL DATA:  Rectal  cancer staging EXAM: CT CHEST, ABDOMEN AND PELVIS WITHOUT CONTRAST TECHNIQUE: Multidetector CT imaging of the chest, abdomen and pelvis was performed following the standard protocol without IV contrast. COMPARISON:  Chest CT dated November 04, 2018. CT abdomen and pelvis dated October 14th 2021 FINDINGS: CT CHEST FINDINGS Cardiovascular: No significant vascular findings. Normal heart size. No pericardial effusion. Left chest wall port with tip in the brachycephalic vein unchanged compared to prior. Mediastinum/Nodes: Thyroid is unremarkable. Small hiatal hernia. No enlarged lymph nodes seen in the chest. Lungs/Pleura: Central airways are patent. Prior left lower lobe wedge resection. No new or enlarging pulmonary nodules, although evaluation is limited  due to respiratory motion artifact. Elevation of the right hemidiaphragm with associated atelectasis. No consolidation, pleural effusion or pneumothorax. Musculoskeletal: Diffuse bony sclerosis and coarsening of trabeculation with similar appearance to prior imaging. CT ABDOMEN PELVIS FINDINGS Hepatobiliary: No focal liver abnormality is seen. Distended gallbladder with no evidence of gallbladder wall thickening or adjacent inflammatory changes. No biliary ductal dilation. Pancreas: Fatty atrophy of the pancreas. Spleen: Normal in size without focal abnormality. Adrenals/Urinary Tract: Mild bilateral adrenal gland thickening with no discrete nodule. Atrophic left kidney. Small simple cyst of the right kidney. No hydronephrosis or nephrolithiasis. Bladder is decompressed. Stomach/Bowel: Normal appearance of the stomach. Normal appendix. Left lower quadrant colostomy with unchanged fat herniating through the ostomy site. No evidence of wall thickening, inflammatory change, or obstruction. Vascular/Lymphatic: No significant vascular findings are present. No enlarged abdominal or pelvic lymph nodes. Reproductive: Prior hysterectomy in the setting of prior APR. Unchanged pelvic soft tissue. Other: Small fat containing umbilical hernia. Musculoskeletal: Diffuse bony sclerosis and coarsening of trabeculation with similar appearance to prior imaging. IMPRESSION: Prior APR with unchanged pelvic soft tissue. No evidence of recurrent or metastatic disease in the chest, abdomen or pelvis. Diffuse bony sclerosis and coarsening of trabeculation with similar appearance to prior imaging. Likely sequela of Paget's disease. Hydropic gallbladder. Correlate for right upper quadrant tenderness. Electronically Signed   By: Yetta Glassman M.D.   On: 01/02/2021 12:16     ASSESSMENT:  ***   PLAN:  ***   Orders placed this encounter:  No orders of the defined types were placed in this encounter.    Derek Jack,  MD [facility] [phone]   I, ***, am acting as a scribe for Dr. Derek Jack.  {Add Barista Statement}

## 2021-01-10 ENCOUNTER — Inpatient Hospital Stay (HOSPITAL_COMMUNITY): Payer: Medicare Other | Admitting: Hematology

## 2021-01-16 NOTE — Progress Notes (Signed)
[place], [place] [address]   CLINIC:  Medical Oncology/Hematology  PCP:  Neale Burly, MD [place] / [place] [address] [phone]   REASON FOR VISIT:  Follow-up for stage IV rectal cancer  PRIOR THERAPY:  1. Concurrent chemoradiation with Xeloda from 03/25/2013 to 05/05/2013. 2. Abdominoperineal resection and TAH & BSO on 07/26/2013. 3. LLL lung resection in August 2016. 4. FOLFOX and Avastin x 12 cycles from 01/04/2015 to 06/27/2015.  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   03/25/2013 - 05/05/2013 Chemotherapy   Xeloda 1500 mg BID 5 days/week with radiation therapy.   07/26/2013 Definitive Surgery   Dr. Arnoldo Morale- Flexible sigmoidoscopy, abdominoperineal resection, total abdominal hysterectomy with bilateral salpingo-oophorectomy   08/30/2013 - 03/01/2014 Chemotherapy   Xeloda 1000 mg BID 14 days on and 7 days off x 6 months   03/08/2014 Imaging   CT CAP- Bilateral pulmonary nodules, including an 8 mm left lower lobe nodule. Metastatic disease is a concern. The left lower lobe nodule has progressed since 03/05/2013, when it measured 5 mm.   05/26/2014 Imaging   CT Chest- Continued enlargement of the superior segment left lower lobe nodule. Malignancy is likely.   In contrast, the right lower lobe nodule is probably benign.   08/17/2014 Imaging   CT- Superior segment left lower lobe pulmonary nodule measures stable since the most recent comparison study, but is again noted to have increased in size when comparing to older studies. Continued close attention will be required as neoplasm remains a con   10/26/2014 Surgery   thoracoscopic left lower lobe superior  segmentectomy. Pathology with metastatic adenocarcinoma c/w colonic primary   01/04/2015 - 06/27/2015 Chemotherapy   FOLFOX+Avastin (Avastin started on 01/18/2015).   01/09/2015 Imaging   CT CAP- Interval wedge resection of metastasis within the left lower lobe. No evidence of new metastatic disease within the chest, abdomen or pelvis.   02/15/2015 Treatment Plan Change   Treatment deferred due to renal function change (patient poor historian) with N/V.   02/22/2015 Treatment Plan Change   Added Aranesp at renal dosing to supportive therpay plan.   03/08/2015 Treatment Plan Change   Defer treatment x 1 week   04/12/2015 Adverse Reaction   Patient reported mouth sores.  None on exam.     04/12/2015 Treatment Plan Change   5FU bolus is discontinued for "mouth sores"   04/12/2015 Adverse Reaction   Increasing Creatinine Cl.  CrCl calculated to be 30.7.     04/12/2015 Treatment Plan Change   Oxaliplatin dose reduced by 20% to 65 mg/m2 based upon creatinine clearance.   05/24/2015 Treatment Plan Change   5FU CI decreased by 15%.   08/29/2015 Imaging   CT CAP- Status post APR with left lower quadrant colostomy, and some stable presacral scarring. No definite evidence of metastatic disease in the CAP.  There is a new 3 mm nodule in the right lower lobe which is highly nonspecific. Attention on follow-up.   01/03/2016 Imaging   CT chest- No definite findings to suggest metastatic disease to the lungs. Previously noted 3 mm right lower lobe pulmonary nodule stable, favored to be benign.   08/22/2016 Imaging   CT CAP-1.  Abdominoperineal resection with stable scarring and left lower quadrant colostomy. 2. 4 mm right lower lobe nodule, stable. 3. 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, a 70 y.o. female, returns for routine follow-up of her stage IV rectal cancer. [name] was last seen on 07/10/2020.   Today she reports feeling good. She denies fatigue, new pains, abdominal pain, hematuria, hematochezia, and n/v/d/c. She empties her ostomy bag about 4-5 times a day, and her appetite is good.   REVIEW OF SYSTEMS:  Review of Systems  Constitutional:  Negative for appetite change and fatigue (60%).  Respiratory:  Positive for cough.   Gastrointestinal:  Negative for abdominal pain, blood in stool, constipation, diarrhea, nausea and vomiting.  Genitourinary:  Negative for hematuria.   Musculoskeletal:  Negative for arthralgias and myalgias.  All other systems reviewed and are negative.  PAST MEDICAL/SURGICAL HISTORY:  Past Medical History:  Diagnosis Date   Arthritis    Asthma    Cancer ([place])    Rectal    Chronic renal disease, stage 3, moderately decreased glomerular filtration rate between 30-59 mL/min/1.73 square meter ([place]) 04/27/2014   Depression    Diabetes mellitus    years   GERD (gastroesophageal reflux disease)    Gout    History of recurrent UTIs    Hypertension    Iron deficiency 04/27/2014   Mental retardation    stopped school at 9th grade    Numbness and tingling in hands    x several months    Rectal cancer ([place])    Seizures ([place])    more than 4 yrs since last seizure. UNknown etiology   Shortness of breath    with exertion   Vitamin B 12 deficiency 02/01/2015   Incidentally found without antibody testing.     Past Surgical History:  Procedure Laterality Date   ABDOMINAL HYSTERECTOMY     ABDOMINAL PERINEAL BOWEL RESECTION N/A 07/26/2013   Procedure:  ABDOMINAL PERINEAL RESECTION;  Surgeon: Jamesetta So, MD;  Location: [facility];   Service: General;  Laterality: N/A;   COLON SURGERY  2015   bowel resection/w colostomy   COLONOSCOPY N/A 02/04/2013   Procedure: COLONOSCOPY;  Surgeon: Rogene Houston, MD;  Location[facility];  Service: Endoscopy;  Laterality: N/A;  225   COLONOSCOPY N/A 05/12/2014   Procedure: COLONOSCOPY;  Surgeon: Rogene Houston, MD;  [facility];  Service: Endoscopy;  Laterality: N/A;  1030   COLONOSCOPY WITH ESOPHAGOGASTRODUODENOSCOPY (EGD) N/A 01/14/2013   Procedure: COLONOSCOPY WITH ESOPHAGOGASTRODUODENOSCOPY (EGD);  Surgeon: Rogene Houston, MD;  [facility];  Service: Endoscopy;  Laterality: N/A;  250-moved to 315 Ann to notify pt   COLOSTOMY Left 07/26/2013   Procedure: COLOSTOMY;  Surgeon: Jamesetta So, MD;  Location: [facility];  Service: General;  Laterality: Left;   EUS N/A 02/18/2013   Procedure: LOWER ENDOSCOPIC ULTRASOUND (EUS);  Surgeon: Milus Banister, MD;  Location: [facility];  Service: Endoscopy;  Laterality: N/A;   FLEXIBLE SIGMOIDOSCOPY N/A 07/26/2013   Procedure: FLEXIBLE SIGMOIDOSCOPY;  Surgeon: Jamesetta So, MD;  Location: [facility];  Service: General;  Laterality: N/A;   LYMPH NODE DISSECTION Left 10/26/2014   Procedure: LYMPH NODE DISSECTION;  Surgeon: [name], MD;  Location: [facility];  Service: Thoracic;  Laterality: Left;   MULTIPLE EXTRACTIONS WITH ALVEOLOPLASTY N/A 11/23/2012   Procedure: MULTIPLE EXTRACION 1, 2, 4, 5, 6, 7, 8, 9, 10, 11, 12, 13, 14, 17, 18, 20, 23, 24, 25, 26, 28, 29, 32 WITH ALVEOLOPLASTY, REMOVE BILATERAL TORI;  Surgeon: Gae Bon, DDS;  Location: [facility];  Service: Oral Surgery;  Laterality: N/A;   PORTACATH PLACEMENT Left 01/02/2015   Procedure: INSERTION PORT-A-CATH;  Surgeon: Aviva Signs, MD;  Location: [facility];  Service: General;  Laterality: Left;   SALPINGOOPHORECTOMY Bilateral 07/26/2013   Procedure: SALPINGO OOPHORECTOMY;  Surgeon: Jamesetta So, MD;  Location: [facility];  Service: General;  Laterality: Bilateral;    SEGMENTECOMY Left 10/26/2014   Procedure: LEFT LOWER LOBE SUPERIOR SEGMENTECTOMY;  Surgeon: [name], MD;  Location: [place];  Service: Thoracic;  Laterality: Left;   SUPRACERVICAL ABDOMINAL HYSTERECTOMY N/A 07/26/2013   Procedure: HYSTERECTOMY SUPRACERVICAL ABDOMINAL ;  Surgeon: Jamesetta So, MD;  Location[facility];  Service: General;  Laterality: N/A;   VIDEO ASSISTED THORACOSCOPY Left 10/26/2014   Procedure: LEFT VIDEO ASSISTED THORACOSCOPY;  Surgeon: [name], MD;  Location: [place];  Service: Thoracic;  Laterality: Left;    SOCIAL HISTORY:  Social History   Socioeconomic History   Marital status: Single    Spouse name: Not on file   Number of children: Not on file   Years of education: Not on file   Highest education level: Not on file  Occupational History   Not on file  Tobacco Use   Smoking status: Former    Types: Cigarettes    Quit date: 07/16/2008    Years since quitting: 12.5   Smokeless tobacco: Never   Tobacco comments:    unknown when stopped smoking      long time  Substance and Sexual Activity   Alcohol use: No   Drug use: No   Sexual activity: Not on file  Other Topics Concern   Not on file  Social History Narrative   Not on file   Social Determinants of Health   Financial Resource Strain: Not on file  Food Insecurity: Not on file  Transportation Needs: Not on file  Physical Activity: Not on file  Stress: Not on file  Social Connections: Not on file  Intimate Partner Violence: Not on file    FAMILY HISTORY:  Family History  Family history unknown: Yes    CURRENT MEDICATIONS:  Current Outpatient Medications  Medication Sig Dispense Refill   ACCU-CHEK AVIVA PLUS test strip      allopurinol (ZYLOPRIM) 300 MG tablet Take 300 mg by mouth daily.     amLODipine (NORVASC) 5 MG tablet Take 5 mg by mouth daily.     aspirin EC 81 MG tablet Take 81 mg by mouth daily.     calcium carbonate (TUMS) 500 MG chewable tablet Chew 2 tablets  (400 mg of elemental calcium total) by mouth 3 (three) times daily. 180 tablet 2   calcium-vitamin D (OSCAL WITH D) 500-200 MG-UNIT tablet Take 2 tablets by mouth daily with breakfast.     colchicine 0.6 MG tablet Take 0.6 mg by mouth 2 (two) times daily.     Cyanocobalamin 1000 MCG CAPS Take 1 capsule by mouth daily. 30 capsule 3   docusate sodium (  COLACE) 100 MG capsule Take 100 mg by mouth 2 (two) times daily.     donepezil (ARICEPT) 5 MG tablet Take 5 mg by mouth daily.     doxycycline (VIBRA-TABS) 100 MG tablet Take 1 tablet (100 mg total) by mouth 2 (two) times daily. 20 tablet 0   ferrous sulfate 325 (65 FE) MG tablet Take by mouth.     fish oil-omega-3 fatty acids 1000 MG capsule Take 1 g by mouth 2 (two) times daily.     Fluticasone-Salmeterol (ADVAIR) 250-50 MCG/DOSE AEPB Inhale 1 puff into the lungs every 12 (twelve) hours.     furosemide (LASIX) 40 MG tablet Take 40 mg by mouth 2 (two) times daily.     Lancets Misc. (UNISTIK 3 COMFORT) MISC      LANTUS SOLOSTAR 100 UNIT/ML Solostar Pen SMARTSIG:12 Unit(s) SUB-Q Every Night     loratadine (CLARITIN) 10 MG tablet Take by mouth.     metFORMIN (GLUCOPHAGE) 1000 MG tablet Take by mouth.     metoprolol (TOPROL-XL) 200 MG 24 hr tablet Take 200 mg by mouth every evening.      montelukast (SINGULAIR) 10 MG tablet Take 10 mg by mouth at bedtime.     mupirocin ointment (BACTROBAN) 2 % SMARTSIG:1 Application Topical 2-3 Times Daily     naproxen (NAPROSYN) 375 MG tablet Take 375 mg by mouth 2 (two) times daily.     NOVOFINE AUTOCOVER 30G X 8 MM MISC USE ONCE DAILY WITHOINSULIN PEN(S).     nystatin (MYCOSTATIN/NYSTOP) powder SMARTSIG:1 Application Topical 2-3 Times Daily     OS-CAL CALCIUM + D3 500-200 MG-UNIT TABS TAKE 2 TABLETS BY MOUTH DAILY WITH BREAKFAST. 60 tablet 0   pantoprazole (PROTONIX) 40 MG tablet Take 40 mg by mouth every morning.      phenytoin (DILANTIN) 100 MG ER capsule Take 100 mg by mouth 2 (two) times daily.     pravastatin  (PRAVACHOL) 40 MG tablet Take 40 mg by mouth every evening.      vitamin B-12 (CYANOCOBALAMIN) 1000 MCG tablet TAKE (1) TABLET BY MOUTH ONCE DAILY. 30 tablet 6   ipratropium-albuterol (DUONEB) 0.5-2.5 (3) MG/3ML SOLN Take 3 mLs by nebulization every 2 (two) hours as needed. Congestion and wheezing (Patient not taking: Reported on 01/17/2021)     PROAIR HFA 108 (90 Base) MCG/ACT inhaler Inhale 1 puff into the lungs every 4 (four) hours as needed.  (Patient not taking: Reported on 01/17/2021)     traZODone (DESYREL) 50 MG tablet Take 50 mg by mouth at bedtime as needed.  (Patient not taking: Reported on 01/17/2021)     triamcinolone cream (KENALOG) 0.1 % SMARTSIG:1 Application Topical 2-3 Times Daily (Patient not taking: Reported on 01/17/2021)     No current facility-administered medications for this visit.    ALLERGIES:  No Known Allergies  PHYSICAL EXAM:  Performance status (ECOG): 2 - Symptomatic, <50% confined to bed  Vitals:   01/17/21 1110  BP: 134/76  Pulse: 75  Resp: 18  Temp: 98 F (36.7 C)  SpO2: 96%   Wt Readings from Last 3 Encounters:  01/17/21 231 lb (104.8 kg)  07/10/20 254 lb (115.2 kg)  01/10/20 259 lb 9.6 oz (117.8 kg)   Physical Exam Vitals reviewed.  Constitutional:      Appearance: Normal appearance.     Comments: In wheelchair  Cardiovascular:     Rate and Rhythm: Normal rate and regular rhythm.     Pulses: Normal pulses.     Heart  sounds: Normal heart sounds.  Pulmonary:     Effort: Pulmonary effort is normal.     Breath sounds: Normal breath sounds.  Abdominal:     Palpations: Abdomen is soft. There is no hepatomegaly, splenomegaly or mass.     Tenderness: There is no abdominal tenderness.     Comments: Left lower quadrant ostomy  Neurological:     General: No focal deficit present.     Mental Status: She is alert and oriented to person, place, and time.  Psychiatric:        Mood and Affect: Mood normal.        Behavior: Behavior normal.      LABORATORY DATA:  I have reviewed the labs as listed.  CBC Latest Ref Rng & Units 01/01/2021 07/04/2020 12/29/2019  WBC 4.0 - 10.5 K/uL 6.4 6.6 6.2  Hemoglobin 12.0 - 15.0 g/dL 10.3(L) 10.5(L) 10.1(L)  Hematocrit 36.0 - 46.0 % 33.2(L) 34.8(L) 32.6(L)  Platelets 150 - 400 K/uL 198 199 200   CMP Latest Ref Rng & Units 01/01/2021 07/04/2020 12/29/2019  Glucose 70 - 99 mg/dL 84 89 161(H)  BUN 8 - 23 mg/dL 58(H) 54(H) 46(H)  Creatinine 0.44 - 1.00 mg/dL 1.87(H) 1.63(H) 1.46(H)  Sodium 135 - 145 mmol/L 133(L) 138 134(L)  Potassium 3.5 - 5.1 mmol/L 5.0 4.1 3.9  Chloride 98 - 111 mmol/L 95(L) 97(L) 92(L)  CO2 22 - 32 mmol/L 29 28 29   Calcium 8.9 - 10.3 mg/dL 8.9 8.5(L) 8.5(L)  Total Protein 6.5 - 8.1 g/dL 7.8 7.7 7.2  Total Bilirubin 0.3 - 1.2 mg/dL 0.2(L) 0.2(L) 0.3  Alkaline Phos 38 - 126 U/L 475(H) 418(H) 393(H)  AST 15 - 41 U/L 17 19 19   ALT 0 - 44 U/L 16 15 15     DIAGNOSTIC IMAGING:  I have independently reviewed the scans and discussed with the patient. CT CHEST ABDOMEN PELVIS WO CONTRAST  Result Date: 01/02/2021 CLINICAL DATA:  Rectal cancer staging EXAM: CT CHEST, ABDOMEN AND PELVIS WITHOUT CONTRAST TECHNIQUE: Multidetector CT imaging of the chest, abdomen and pelvis was performed following the standard protocol without IV contrast. COMPARISON:  Chest CT dated November 04, 2018. CT abdomen and pelvis dated October 14th 2021 FINDINGS: CT CHEST FINDINGS Cardiovascular: No significant vascular findings. Normal heart size. No pericardial effusion. Left chest wall port with tip in the brachycephalic vein unchanged compared to prior. Mediastinum/Nodes: Thyroid is unremarkable. Small hiatal hernia. No enlarged lymph nodes seen in the chest. Lungs/Pleura: Central airways are patent. Prior left lower lobe wedge resection. No new or enlarging pulmonary nodules, although evaluation is limited due to respiratory motion artifact. Elevation of the right hemidiaphragm with associated atelectasis. No  consolidation, pleural effusion or pneumothorax. Musculoskeletal: Diffuse bony sclerosis and coarsening of trabeculation with similar appearance to prior imaging. CT ABDOMEN PELVIS FINDINGS Hepatobiliary: No focal liver abnormality is seen. Distended gallbladder with no evidence of gallbladder wall thickening or adjacent inflammatory changes. No biliary ductal dilation. Pancreas: Fatty atrophy of the pancreas. Spleen: Normal in size without focal abnormality. Adrenals/Urinary Tract: Mild bilateral adrenal gland thickening with no discrete nodule. Atrophic left kidney. Small simple cyst of the right kidney. No hydronephrosis or nephrolithiasis. Bladder is decompressed. Stomach/Bowel: Normal appearance of the stomach. Normal appendix. Left lower quadrant colostomy with unchanged fat herniating through the ostomy site. No evidence of wall thickening, inflammatory change, or obstruction. Vascular/Lymphatic: No significant vascular findings are present. No enlarged abdominal or pelvic lymph nodes. Reproductive: Prior hysterectomy in the setting of prior APR. Unchanged  pelvic soft tissue. Other: Small fat containing umbilical hernia. Musculoskeletal: Diffuse bony sclerosis and coarsening of trabeculation with similar appearance to prior imaging. IMPRESSION: Prior APR with unchanged pelvic soft tissue. No evidence of recurrent or metastatic disease in the chest, abdomen or pelvis. Diffuse bony sclerosis and coarsening of trabeculation with similar appearance to prior imaging. Likely sequela of Paget's disease. Hydropic gallbladder. Correlate for right upper quadrant tenderness. Electronically Signed   By: Yetta Glassman M.D.   On: 01/02/2021 12:16     ASSESSMENT:  1.  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 -Left lower lobe lung nodule in August 2016,  status post resection confirming metastatic adenocarcinoma. -FOLFOX and Avastin from October 2016 through 06/27/2015. -CEA on 04/29/2019 was 5.6. -CT scan from April 28, 2019 with no evidence of active malignancy. -CTAP on December 30, 2019 showed stable soft tissue swelling in the pelvis from prior treatment.  Diffuse bony sclerosis also stable.  Lobular hepatic contour is consistent with liver disease.  Marked cortical scarring of the left kidney as before.   2.  Vitamin B12 deficiency:  -She had a history of B12 injections monthly which were continued until March 2021.   3.  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   PLAN:  1.  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 - CEA was slightly elevated at 6.4, up from 4.9 at last visit in April. - Reviewed CT CAP from 01/01/2021 which showed prior APR with unchanged pelvic soft tissue.  No evidence of recurrent or metastatic disease in the chest, abdomen or pelvis. - Recommend follow-up in 6 months with repeat CT CAP, CEA level.   2.  Vitamin B12 deficiency: -Continue B12 1 mg tablet daily.  B12 was normal.   3.  Normocytic anemia: - Combination anemia from CKD and iron deficiency. - Hemoglobin is 10.3, ferritin 84 and percent saturation 28.  B12 was normal. - Continue iron tablet daily.   Orders placed this encounter:  No orders of the defined types were placed in this encounter.    Derek Jack, MD [place] [phone]   I, Thana Ates, am acting as a scribe for Dr. Derek Jack.  I, Derek Jack MD, have reviewed the above documentation for accuracy and completeness, and I agree with the above.

## 2021-01-17 ENCOUNTER — Inpatient Hospital Stay (HOSPITAL_COMMUNITY): Payer: Medicare Other | Attending: Hematology | Admitting: Hematology

## 2021-01-17 ENCOUNTER — Other Ambulatory Visit: Payer: Self-pay

## 2021-01-17 VITALS — BP 134/76 | HR 75 | Temp 98.0°F | Resp 18 | Wt 231.0 lb

## 2021-01-17 DIAGNOSIS — C20 Malignant neoplasm of rectum: Secondary | ICD-10-CM

## 2021-01-17 DIAGNOSIS — D509 Iron deficiency anemia, unspecified: Secondary | ICD-10-CM | POA: Insufficient documentation

## 2021-01-17 DIAGNOSIS — Z7951 Long term (current) use of inhaled steroids: Secondary | ICD-10-CM | POA: Insufficient documentation

## 2021-01-17 DIAGNOSIS — Z794 Long term (current) use of insulin: Secondary | ICD-10-CM | POA: Insufficient documentation

## 2021-01-17 DIAGNOSIS — Z923 Personal history of irradiation: Secondary | ICD-10-CM | POA: Diagnosis not present

## 2021-01-17 DIAGNOSIS — Z79899 Other long term (current) drug therapy: Secondary | ICD-10-CM | POA: Insufficient documentation

## 2021-01-17 DIAGNOSIS — Z7982 Long term (current) use of aspirin: Secondary | ICD-10-CM | POA: Diagnosis not present

## 2021-01-17 DIAGNOSIS — E538 Deficiency of other specified B group vitamins: Secondary | ICD-10-CM | POA: Insufficient documentation

## 2021-01-17 DIAGNOSIS — Z87891 Personal history of nicotine dependence: Secondary | ICD-10-CM | POA: Diagnosis not present

## 2021-01-17 DIAGNOSIS — C7800 Secondary malignant neoplasm of unspecified lung: Secondary | ICD-10-CM | POA: Diagnosis not present

## 2021-01-17 DIAGNOSIS — D631 Anemia in chronic kidney disease: Secondary | ICD-10-CM | POA: Insufficient documentation

## 2021-01-17 DIAGNOSIS — N183 Chronic kidney disease, stage 3 unspecified: Secondary | ICD-10-CM | POA: Diagnosis not present

## 2021-01-17 DIAGNOSIS — Z9221 Personal history of antineoplastic chemotherapy: Secondary | ICD-10-CM | POA: Diagnosis not present

## 2021-01-17 DIAGNOSIS — Z85048 Personal history of other malignant neoplasm of rectum, rectosigmoid junction, and anus: Secondary | ICD-10-CM | POA: Insufficient documentation

## 2021-05-03 ENCOUNTER — Ambulatory Visit: Payer: Medicare Other | Admitting: Podiatry

## 2021-06-05 ENCOUNTER — Ambulatory Visit: Payer: Medicare Other | Admitting: Podiatry

## 2021-07-17 ENCOUNTER — Inpatient Hospital Stay (HOSPITAL_COMMUNITY): Payer: Medicare Other | Attending: Hematology

## 2021-07-17 ENCOUNTER — Ambulatory Visit (HOSPITAL_COMMUNITY)
Admission: RE | Admit: 2021-07-17 | Discharge: 2021-07-17 | Disposition: A | Discharge status: Home / Self Care | Payer: Medicare Other | Source: Ambulatory Visit | Attending: Hematology | Admitting: Hematology

## 2021-07-17 DIAGNOSIS — C7800 Secondary malignant neoplasm of unspecified lung: Secondary | ICD-10-CM | POA: Insufficient documentation

## 2021-07-17 DIAGNOSIS — N3289 Other specified disorders of bladder: Secondary | ICD-10-CM | POA: Diagnosis not present

## 2021-07-17 DIAGNOSIS — D509 Iron deficiency anemia, unspecified: Secondary | ICD-10-CM | POA: Diagnosis not present

## 2021-07-17 DIAGNOSIS — C20 Malignant neoplasm of rectum: Secondary | ICD-10-CM | POA: Diagnosis not present

## 2021-07-17 DIAGNOSIS — E538 Deficiency of other specified B group vitamins: Secondary | ICD-10-CM

## 2021-07-17 DIAGNOSIS — J9811 Atelectasis: Secondary | ICD-10-CM | POA: Diagnosis not present

## 2021-07-17 LAB — CBC WITH DIFFERENTIAL/PLATELET
Abs Immature Granulocytes: 0.02 10*3/uL (ref 0.00–0.07)
Basophils Absolute: 0 10*3/uL (ref 0.0–0.1)
Basophils Relative: 0 %
Eosinophils Absolute: 0.3 10*3/uL (ref 0.0–0.5)
Eosinophils Relative: 6 %
HCT: 32.2 % — ABNORMAL LOW (ref 36.0–46.0)
Hemoglobin: 10.1 g/dL — ABNORMAL LOW (ref 12.0–15.0)
Immature Granulocytes: 0 %
Lymphocytes Relative: 9 %
Lymphs Abs: 0.4 10*3/uL — ABNORMAL LOW (ref 0.7–4.0)
MCH: 28.4 pg (ref 26.0–34.0)
MCHC: 31.4 g/dL (ref 30.0–36.0)
MCV: 90.4 fL (ref 80.0–100.0)
Monocytes Absolute: 0.4 10*3/uL (ref 0.1–1.0)
Monocytes Relative: 9 %
Neutro Abs: 3.5 10*3/uL (ref 1.7–7.7)
Neutrophils Relative %: 76 %
Platelets: 170 10*3/uL (ref 150–400)
RBC: 3.56 MIL/uL — ABNORMAL LOW (ref 3.87–5.11)
RDW: 15.2 % (ref 11.5–15.5)
WBC: 4.7 10*3/uL (ref 4.0–10.5)
nRBC: 0 % (ref 0.0–0.2)

## 2021-07-17 LAB — COMPREHENSIVE METABOLIC PANEL
ALT: 15 U/L (ref 0–44)
AST: 19 U/L (ref 15–41)
Albumin: 3.8 g/dL (ref 3.5–5.0)
Alkaline Phosphatase: 500 U/L — ABNORMAL HIGH (ref 38–126)
Anion gap: 10 (ref 5–15)
BUN: 67 mg/dL — ABNORMAL HIGH (ref 8–23)
CO2: 27 mmol/L (ref 22–32)
Calcium: 9.1 mg/dL (ref 8.9–10.3)
Chloride: 98 mmol/L (ref 98–111)
Creatinine, Ser: 1.76 mg/dL — ABNORMAL HIGH (ref 0.44–1.00)
GFR, Estimated: 31 mL/min — ABNORMAL LOW (ref >60)
Glucose, Bld: 128 mg/dL — ABNORMAL HIGH (ref 70–99)
Potassium: 4.3 mmol/L (ref 3.5–5.1)
Sodium: 135 mmol/L (ref 135–145)
Total Bilirubin: 0.3 mg/dL (ref 0.3–1.2)
Total Protein: 7.8 g/dL (ref 6.5–8.1)

## 2021-07-17 LAB — FERRITIN: Ferritin: 81 ng/mL (ref 11–307)

## 2021-07-17 LAB — IRON AND TIBC
Iron: 123 ug/dL (ref 28–170)
Saturation Ratios: 45 % — ABNORMAL HIGH (ref 10.4–31.8)
TIBC: 271 ug/dL (ref 250–450)
UIBC: 148 ug/dL

## 2021-07-17 LAB — VITAMIN B12: Vitamin B-12: 2272 pg/mL — ABNORMAL HIGH (ref 180–914)

## 2021-07-24 ENCOUNTER — Ambulatory Visit (HOSPITAL_COMMUNITY): Payer: Medicare Other | Admitting: Hematology

## 2021-07-25 ENCOUNTER — Other Ambulatory Visit (HOSPITAL_COMMUNITY): Payer: Self-pay | Admitting: *Deleted

## 2021-07-25 DIAGNOSIS — C20 Malignant neoplasm of rectum: Secondary | ICD-10-CM

## 2021-07-25 NOTE — Progress Notes (Signed)
Repeat CEA ordered because of error with lab handling.   ?

## 2021-07-26 ENCOUNTER — Inpatient Hospital Stay (HOSPITAL_COMMUNITY): Payer: Medicare Other

## 2021-07-26 DIAGNOSIS — C7800 Secondary malignant neoplasm of unspecified lung: Secondary | ICD-10-CM

## 2021-07-28 DIAGNOSIS — T148XXA Other injury of unspecified body region, initial encounter: Secondary | ICD-10-CM | POA: Diagnosis not present

## 2021-07-28 DIAGNOSIS — W19XXXA Unspecified fall, initial encounter: Secondary | ICD-10-CM | POA: Diagnosis not present

## 2021-07-28 LAB — CEA: CEA: 6.2 ng/mL — ABNORMAL HIGH (ref 0.0–4.7)

## 2021-07-30 ENCOUNTER — Ambulatory Visit (HOSPITAL_COMMUNITY): Payer: Medicare Other | Admitting: Hematology

## 2021-07-30 DIAGNOSIS — R0602 Shortness of breath: Secondary | ICD-10-CM | POA: Diagnosis not present

## 2021-08-09 DIAGNOSIS — R5383 Other fatigue: Secondary | ICD-10-CM | POA: Diagnosis not present

## 2021-08-09 DIAGNOSIS — R509 Fever, unspecified: Secondary | ICD-10-CM | POA: Diagnosis not present

## 2021-08-09 DIAGNOSIS — Z20822 Contact with and (suspected) exposure to covid-19: Secondary | ICD-10-CM | POA: Diagnosis not present

## 2021-08-21 ENCOUNTER — Ambulatory Visit (HOSPITAL_COMMUNITY): Payer: Medicare Other | Admitting: Hematology

## 2021-08-23 DIAGNOSIS — Z1231 Encounter for screening mammogram for malignant neoplasm of breast: Secondary | ICD-10-CM | POA: Diagnosis not present

## 2021-09-03 DIAGNOSIS — Z933 Colostomy status: Secondary | ICD-10-CM | POA: Diagnosis not present

## 2021-09-06 ENCOUNTER — Encounter (HOSPITAL_COMMUNITY): Payer: Self-pay | Admitting: Hematology

## 2021-09-06 ENCOUNTER — Inpatient Hospital Stay (HOSPITAL_COMMUNITY): Payer: Medicare Other | Attending: Hematology | Admitting: Hematology

## 2021-09-06 VITALS — BP 159/61 | HR 64 | Temp 97.9°F | Resp 16 | Ht 66.0 in | Wt 234.1 lb

## 2021-09-06 DIAGNOSIS — D631 Anemia in chronic kidney disease: Secondary | ICD-10-CM | POA: Insufficient documentation

## 2021-09-06 DIAGNOSIS — C7800 Secondary malignant neoplasm of unspecified lung: Secondary | ICD-10-CM | POA: Diagnosis not present

## 2021-09-06 DIAGNOSIS — H25813 Combined forms of age-related cataract, bilateral: Secondary | ICD-10-CM | POA: Diagnosis not present

## 2021-09-06 DIAGNOSIS — Z85048 Personal history of other malignant neoplasm of rectum, rectosigmoid junction, and anus: Secondary | ICD-10-CM | POA: Diagnosis not present

## 2021-09-06 DIAGNOSIS — C20 Malignant neoplasm of rectum: Secondary | ICD-10-CM

## 2021-09-06 DIAGNOSIS — N189 Chronic kidney disease, unspecified: Secondary | ICD-10-CM | POA: Insufficient documentation

## 2021-09-06 DIAGNOSIS — Z79899 Other long term (current) drug therapy: Secondary | ICD-10-CM | POA: Insufficient documentation

## 2021-09-06 DIAGNOSIS — E538 Deficiency of other specified B group vitamins: Secondary | ICD-10-CM | POA: Insufficient documentation

## 2021-09-06 DIAGNOSIS — D509 Iron deficiency anemia, unspecified: Secondary | ICD-10-CM | POA: Diagnosis not present

## 2021-09-06 DIAGNOSIS — Z9221 Personal history of antineoplastic chemotherapy: Secondary | ICD-10-CM | POA: Insufficient documentation

## 2021-09-06 DIAGNOSIS — Z923 Personal history of irradiation: Secondary | ICD-10-CM | POA: Diagnosis not present

## 2021-09-06 NOTE — Patient Instructions (Addendum)
[place] at [facility] Discharge Instructions  You were seen and examined today by Dr. Delton Coombes.  Dr. Delton Coombes discussed your most recent lab work and CT scan which is stable.  Dr. Delton Coombes will recheck your labs in 6 months and a repeat scan in 1 year.  Follow-up as scheduled.  Thank you for choosing [place] at [facility] to provide your oncology and hematology care.  To afford each patient quality time with our provider, please arrive at least 15 minutes before your scheduled appointment time.   If you have a lab appointment with the [facility] please come in thru the Main Entrance and check in at the main information desk.  You need to re-schedule your appointment should you arrive 10 or more minutes late.  We strive to give you quality time with our providers, and arriving late affects you and other patients whose appointments are after yours.  Also, if you no show three or more times for appointments you may be dismissed from the clinic at the providers discretion.     Again, thank you for choosing [facility].  Our hope is that these requests will decrease the amount of time that you wait before being seen by our physicians.       _____________________________________________________________  Should you have questions after your visit to [facility], please contact our office at [phone] and follow the prompts.  Our office hours are 8:00 a.m. and 4:30 p.m. Monday - Friday.  Please note that voicemails left after 4:00 p.m. may not be returned until the following business day.  We are closed weekends and major holidays.  You do have access to a nurse 24-7, just call the main number to the clinic [phone] and do not press any options, hold on the line and a nurse will answer the phone.    For prescription refill requests, have your pharmacy contact our office and allow 72 hours.

## 2021-09-12 DIAGNOSIS — E782 Mixed hyperlipidemia: Secondary | ICD-10-CM | POA: Diagnosis not present

## 2021-09-12 DIAGNOSIS — K219 Gastro-esophageal reflux disease without esophagitis: Secondary | ICD-10-CM | POA: Diagnosis not present

## 2021-09-12 DIAGNOSIS — I872 Venous insufficiency (chronic) (peripheral): Secondary | ICD-10-CM | POA: Diagnosis not present

## 2021-09-12 DIAGNOSIS — N1831 Chronic kidney disease, stage 3a: Secondary | ICD-10-CM | POA: Diagnosis not present

## 2021-09-12 DIAGNOSIS — Z0000 Encounter for general adult medical examination without abnormal findings: Secondary | ICD-10-CM | POA: Diagnosis not present

## 2021-09-12 DIAGNOSIS — E1143 Type 2 diabetes mellitus with diabetic autonomic (poly)neuropathy: Secondary | ICD-10-CM | POA: Diagnosis not present

## 2021-09-12 DIAGNOSIS — I10 Essential (primary) hypertension: Secondary | ICD-10-CM | POA: Diagnosis not present

## 2021-09-12 DIAGNOSIS — J449 Chronic obstructive pulmonary disease, unspecified: Secondary | ICD-10-CM | POA: Diagnosis not present

## 2021-10-03 DIAGNOSIS — Z933 Colostomy status: Secondary | ICD-10-CM | POA: Diagnosis not present

## 2021-10-03 DIAGNOSIS — I5020 Unspecified systolic (congestive) heart failure: Secondary | ICD-10-CM | POA: Diagnosis not present

## 2021-10-11 DIAGNOSIS — E1143 Type 2 diabetes mellitus with diabetic autonomic (poly)neuropathy: Secondary | ICD-10-CM | POA: Diagnosis not present

## 2021-11-05 ENCOUNTER — Encounter (HOSPITAL_COMMUNITY)
Admission: RE | Admit: 2021-11-05 | Discharge: 2021-11-05 | Disposition: A | Discharge status: Home / Self Care | Payer: Medicare Other | Source: Ambulatory Visit | Attending: Ophthalmology | Admitting: Ophthalmology

## 2021-11-05 ENCOUNTER — Encounter (HOSPITAL_COMMUNITY): Payer: Self-pay | Admitting: Ophthalmology

## 2021-11-05 DIAGNOSIS — I10 Essential (primary) hypertension: Secondary | ICD-10-CM | POA: Diagnosis not present

## 2021-11-05 DIAGNOSIS — H25812 Combined forms of age-related cataract, left eye: Secondary | ICD-10-CM | POA: Diagnosis not present

## 2021-11-09 NOTE — H&P (Signed)
Surgical History & Physical  Patient Name: [name] DOB: 1950-05-04  Surgery: Cataract extraction with intraocular lens implant phacoemulsification; Left Eye  Surgeon: Baruch Goldmann MD Surgery Date:  11-12-21 Pre-Op Date:  10-25-21  HPI: A 78 Yr. old female patient The patient is here for a cataract evaluation of both eyes. The patient is accompanied by caregiver, patient has dementia. The affected area is worsening. Patient last saw Dr. Jorja Loa, but called back months later to schedule. The patient's vision is blurry. The complaint is associated with difficulty seeing captions on TV or poor night vision. This is negatively affecting the patient's quality of life and the patient is unable to function adequately in life with the current level of vision. HPI was performed by Baruch Goldmann .  Medical History:  Cataracts Cancer Dementia Diabetes - DM Type 2 High Blood Pressure  Review of Systems Respiratory Asthma All recorded systems are negative except as noted above.  Social   Never smoked   Medication  Amlodipine, Allopurinol, Aspirin, Calcium carb, Docusate sodium, Donepezil, Escitalopram, Ferosul, Fish oil, Fluticasone/salm, Furosemide, Loratadine, Metformin, Metoprolol, Montelukast sodium, Nyamyc, Nystatin, Pantoprazole sod, Phenytoin, Pravastatin, Trazodone, Triamcinolone, Vitamin b12, Vitamin d, Albuterol, Levalbuterol, Novofine autocover,   Sx/Procedures No Ocular Sx, Colon Cancer SX, Colostomy,   Drug Allergies   NKDA  History & Physical: Heent: Cataract, left eye NECK: supple without bruits LUNGS: lungs clear to auscultation CV: regular rate and rhythm Abdomen: soft and non-tender Impression & Plan: Assessment: 1.  COMBINED FORMS AGE RELATED CATARACT; Both Eyes (H25.813)  Plan: 1.  Cataract accounts for the patient's decreased vision. This visual impairment is not correctable with a tolerable change in glasses or contact lenses. Cataract surgery with an  implantation of a new lens should significantly improve the visual and functional status of the patient. Discussed all risks, benefits, alternatives, and potential complications. Discussed the procedures and recovery. Patient desires to have surgery. A-scan ordered and performed today for intra-ocular lens calculations. The surgery will be performed in order to improve vision for driving, reading, and for eye examinations. Recommend phacoemulsification with intra-ocular lens. Recommend Dextenza for post-operative pain and inflammation. Left Eye much worse - first. Dilates poorly - shugarcaine by protocol. [facility] [place]. Malyugin Ring. [place].

## 2021-11-12 ENCOUNTER — Encounter (HOSPITAL_COMMUNITY)
Admission: RE | Disposition: A | Discharge status: Home / Self Care | Payer: Self-pay | Source: Home / Self Care | Attending: Ophthalmology

## 2021-11-12 ENCOUNTER — Ambulatory Visit (HOSPITAL_COMMUNITY): Payer: Medicare Other | Admitting: Anesthesiology

## 2021-11-12 ENCOUNTER — Ambulatory Visit (HOSPITAL_BASED_OUTPATIENT_CLINIC_OR_DEPARTMENT_OTHER): Payer: Medicare Other | Admitting: Anesthesiology

## 2021-11-12 ENCOUNTER — Encounter (HOSPITAL_COMMUNITY): Payer: Self-pay | Admitting: Ophthalmology

## 2021-11-12 ENCOUNTER — Ambulatory Visit (HOSPITAL_COMMUNITY)
Admission: RE | Admit: 2021-11-12 | Discharge: 2021-11-12 | Disposition: A | Discharge status: Home / Self Care | Payer: Medicare Other | Attending: Ophthalmology | Admitting: Ophthalmology

## 2021-11-12 DIAGNOSIS — H2512 Age-related nuclear cataract, left eye: Secondary | ICD-10-CM | POA: Insufficient documentation

## 2021-11-12 DIAGNOSIS — J45909 Unspecified asthma, uncomplicated: Secondary | ICD-10-CM | POA: Insufficient documentation

## 2021-11-12 DIAGNOSIS — Z6837 Body mass index (BMI) 37.0-37.9, adult: Secondary | ICD-10-CM | POA: Diagnosis not present

## 2021-11-12 DIAGNOSIS — E1136 Type 2 diabetes mellitus with diabetic cataract: Secondary | ICD-10-CM | POA: Insufficient documentation

## 2021-11-12 DIAGNOSIS — Z794 Long term (current) use of insulin: Secondary | ICD-10-CM | POA: Diagnosis not present

## 2021-11-12 DIAGNOSIS — K219 Gastro-esophageal reflux disease without esophagitis: Secondary | ICD-10-CM | POA: Diagnosis not present

## 2021-11-12 DIAGNOSIS — F0390 Unspecified dementia without behavioral disturbance: Secondary | ICD-10-CM | POA: Insufficient documentation

## 2021-11-12 DIAGNOSIS — D631 Anemia in chronic kidney disease: Secondary | ICD-10-CM | POA: Diagnosis not present

## 2021-11-12 DIAGNOSIS — H2181 Floppy iris syndrome: Secondary | ICD-10-CM | POA: Insufficient documentation

## 2021-11-12 DIAGNOSIS — N183 Chronic kidney disease, stage 3 unspecified: Secondary | ICD-10-CM | POA: Diagnosis not present

## 2021-11-12 DIAGNOSIS — Z87891 Personal history of nicotine dependence: Secondary | ICD-10-CM | POA: Insufficient documentation

## 2021-11-12 DIAGNOSIS — H25812 Combined forms of age-related cataract, left eye: Secondary | ICD-10-CM | POA: Diagnosis not present

## 2021-11-12 DIAGNOSIS — Z7984 Long term (current) use of oral hypoglycemic drugs: Secondary | ICD-10-CM

## 2021-11-12 DIAGNOSIS — I129 Hypertensive chronic kidney disease with stage 1 through stage 4 chronic kidney disease, or unspecified chronic kidney disease: Secondary | ICD-10-CM | POA: Diagnosis not present

## 2021-11-12 DIAGNOSIS — E1122 Type 2 diabetes mellitus with diabetic chronic kidney disease: Secondary | ICD-10-CM

## 2021-11-12 HISTORY — PX: CATARACT EXTRACTION W/PHACO: SHX586

## 2021-11-12 LAB — GLUCOSE, CAPILLARY: Glucose-Capillary: 95 mg/dL (ref 70–99)

## 2021-11-12 SURGERY — PHACOEMULSIFICATION, CATARACT, WITH IOL INSERTION
Anesthesia: Monitor Anesthesia Care | Site: Eye | Laterality: Left

## 2021-11-12 MED ORDER — PHENYLEPHRINE-KETOROLAC 1-0.3 % IO SOLN
INTRAOCULAR | Status: DC | PRN
  Administered 2021-11-12: 500 mL via OPHTHALMIC

## 2021-11-12 MED ORDER — TRYPAN BLUE 0.06 % IO SOSY
PREFILLED_SYRINGE | INTRAOCULAR | Status: AC
  Filled 2021-11-12: qty 0.5

## 2021-11-12 MED ORDER — LIDOCAINE HCL (PF) 1 % IJ SOLN
INTRAOCULAR | Status: DC | PRN
  Administered 2021-11-12: 1 mL via OPHTHALMIC

## 2021-11-12 MED ORDER — PHENYLEPHRINE-KETOROLAC 1-0.3 % IO SOLN
INTRAOCULAR | Status: AC
  Filled 2021-11-12: qty 4

## 2021-11-12 MED ORDER — POVIDONE-IODINE 5 % OP SOLN
OPHTHALMIC | Status: DC | PRN
  Administered 2021-11-12: 1 via OPHTHALMIC

## 2021-11-12 MED ORDER — TETRACAINE HCL 0.5 % OP SOLN
1.0000 [drp] | OPHTHALMIC | Status: AC | PRN
  Administered 2021-11-12 (×3): 1 [drp] via OPHTHALMIC

## 2021-11-12 MED ORDER — LACTATED RINGERS IV SOLN: INTRAVENOUS | Status: DC | PRN

## 2021-11-12 MED ORDER — PROPOFOL 10 MG/ML IV BOLUS
INTRAVENOUS | Status: DC | PRN
  Administered 2021-11-12: 40 mg via INTRAVENOUS
  Administered 2021-11-12 (×2): 20 mg via INTRAVENOUS
  Administered 2021-11-12: 40 mg via INTRAVENOUS

## 2021-11-12 MED ORDER — NEOMYCIN-POLYMYXIN-DEXAMETH 3.5-10000-0.1 OP SUSP
OPHTHALMIC | Status: DC | PRN
  Administered 2021-11-12: 2 [drp] via OPHTHALMIC

## 2021-11-12 MED ORDER — STERILE WATER FOR IRRIGATION IR SOLN
Status: DC | PRN
  Administered 2021-11-12: 500 mL

## 2021-11-12 MED ORDER — PROPOFOL 10 MG/ML IV BOLUS
INTRAVENOUS | Status: AC
  Filled 2021-11-12: qty 20

## 2021-11-12 MED ORDER — TROPICAMIDE 1 % OP SOLN
1.0000 [drp] | OPHTHALMIC | Status: AC | PRN
  Administered 2021-11-12 (×3): 1 [drp] via OPHTHALMIC

## 2021-11-12 MED ORDER — TRYPAN BLUE 0.06 % IO SOSY
PREFILLED_SYRINGE | INTRAOCULAR | Status: DC | PRN
  Administered 2021-11-12: 0.5 mL via INTRAOCULAR

## 2021-11-12 MED ORDER — MIDAZOLAM HCL 5 MG/5ML IJ SOLN
INTRAMUSCULAR | Status: DC | PRN
  Administered 2021-11-12: 1 mg via INTRAVENOUS

## 2021-11-12 MED ORDER — EPINEPHRINE PF 1 MG/ML IJ SOLN
INTRAMUSCULAR | Status: AC
  Filled 2021-11-12: qty 2

## 2021-11-12 MED ORDER — PHENYLEPHRINE HCL 2.5 % OP SOLN
1.0000 [drp] | OPHTHALMIC | Status: AC | PRN
  Administered 2021-11-12 (×3): 1 [drp] via OPHTHALMIC

## 2021-11-12 MED ORDER — LIDOCAINE HCL 3.5 % OP GEL
1.0000 | Freq: Once | OPHTHALMIC | Status: AC
Start: 2021-11-12 — End: 2021-11-12
  Administered 2021-11-12: 1 via OPHTHALMIC

## 2021-11-12 MED ORDER — BSS IO SOLN
INTRAOCULAR | Status: DC | PRN
  Administered 2021-11-12: 15 mL via INTRAOCULAR

## 2021-11-12 MED ORDER — SODIUM HYALURONATE 23MG/ML IO SOSY
PREFILLED_SYRINGE | INTRAOCULAR | Status: DC | PRN
  Administered 2021-11-12: 0.6 mL via INTRAOCULAR

## 2021-11-12 MED ORDER — SODIUM HYALURONATE 10 MG/ML IO SOLUTION
PREFILLED_SYRINGE | INTRAOCULAR | Status: DC | PRN
  Administered 2021-11-12: 0.85 mL via INTRAOCULAR

## 2021-11-12 MED ORDER — MIDAZOLAM HCL 2 MG/2ML IJ SOLN
INTRAMUSCULAR | Status: AC
  Filled 2021-11-12: qty 2

## 2021-11-12 SURGICAL SUPPLY — 19 items
CATARACT SUITE SIGHTPATH (MISCELLANEOUS) ×1 IMPLANT
CLOTH BEACON ORANGE TIMEOUT ST (SAFETY) ×2 IMPLANT
EYE SHIELD UNIVERSAL CLEAR (GAUZE/BANDAGES/DRESSINGS) IMPLANT
FEE CATARACT SUITE SIGHTPATH (MISCELLANEOUS) ×2 IMPLANT
GLOVE BIOGEL PI IND STRL 6.5 (GLOVE) IMPLANT
GLOVE BIOGEL PI IND STRL 7.0 (GLOVE) ×4 IMPLANT
GLOVE BIOGEL PI INDICATOR 6.5 (GLOVE) ×1
GLOVE BIOGEL PI INDICATOR 7.0 (GLOVE) ×1
GLOVE ECLIPSE 6.0 STRL STRAW (GLOVE) IMPLANT
LENS IOL RAYNER 18.0 (Intraocular Lens) ×1 IMPLANT
LENS IOL RAYONE EMV 18.0 (Intraocular Lens) IMPLANT
NDL HYPO 18GX1.5 BLUNT FILL (NEEDLE) IMPLANT
NEEDLE HYPO 18GX1.5 BLUNT FILL (NEEDLE) ×1 IMPLANT
PAD ARMBOARD 7.5X6 YLW CONV (MISCELLANEOUS) ×2 IMPLANT
RING MALYGIN 7.0 (MISCELLANEOUS) IMPLANT
SYR TB 1ML LL NO SAFETY (SYRINGE) ×2 IMPLANT
TAPE SURG TRANSPORE 1 IN (GAUZE/BANDAGES/DRESSINGS) IMPLANT
TAPE SURGICAL TRANSPORE 1 IN (GAUZE/BANDAGES/DRESSINGS) ×1
WATER STERILE IRR 500ML POUR (IV SOLUTION) IMPLANT

## 2021-11-12 NOTE — Discharge Instructions (Signed)
Please discharge patient when stable, will follow up today with Dr. Kohle Winner at the [facility] [place] office immediately following discharge.  Leave shield in place until visit.  All paperwork with discharge instructions will be given at the office.  [facility] Address:  [address]  [place], [place] [address]  

## 2021-11-12 NOTE — Interval H&P Note (Signed)
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  The various methods of treatment have been discussed with the patient and family. After consideration of risks, benefits and other options for treatment, the patient has consented to  Procedure(s) with comments: CATARACT EXTRACTION PHACO AND INTRAOCULAR LENS PLACEMENT ([place]) (Left) - left as a surgical intervention.  The patient's history has been reviewed, patient examined, no change in status, stable for surgery.  I have reviewed the patient's chart and labs.  Questions were answered to the patient's satisfaction.     Baruch Goldmann

## 2021-11-12 NOTE — Anesthesia Preprocedure Evaluation (Signed)
Anesthesia Evaluation  Patient identified by MRN, date of birth, ID band Patient awake    Reviewed: Allergy & Precautions, H&P , NPO status , Patient's Chart, lab work & pertinent test results, reviewed documented beta blocker date and time   Airway Mallampati: II  TM Distance: >3 FB Neck ROM: full    Dental no notable dental hx.    Pulmonary asthma , former smoker,    Pulmonary exam normal breath sounds clear to auscultation       Cardiovascular Exercise Tolerance: Good hypertension, negative cardio ROS   Rhythm:regular Rate:Normal     Neuro/Psych Seizures -, Well Controlled,  PSYCHIATRIC DISORDERS Depression    GI/Hepatic Neg liver ROS, GERD  Medicated,  Endo/Other  diabetesMorbid obesity  Renal/GU CRFRenal disease  negative genitourinary   Musculoskeletal   Abdominal   Peds  Hematology  (+) Blood dyscrasia, anemia ,   Anesthesia Other Findings   Reproductive/Obstetrics negative OB ROS                             Anesthesia Physical Anesthesia Plan  ASA: 3  Anesthesia Plan: MAC   Post-op Pain Management:    Induction:   PONV Risk Score and Plan:   Airway Management Planned:   Additional Equipment:   Intra-op Plan:   Post-operative Plan:   Informed Consent: I have reviewed the patients History and Physical, chart, labs and discussed the procedure including the risks, benefits and alternatives for the proposed anesthesia with the patient or authorized representative who has indicated his/her understanding and acceptance.     Dental Advisory Given  Plan Discussed with: CRNA  Anesthesia Plan Comments:         Anesthesia Quick Evaluation

## 2021-11-12 NOTE — Anesthesia Postprocedure Evaluation (Signed)
Anesthesia Post Note  Patient: [name]  Procedure(s) Performed: CATARACT EXTRACTION PHACO AND INTRAOCULAR LENS PLACEMENT (I[facility]) (Left: Eye)  Patient location during evaluation: Short Stay Anesthesia Type: MAC Level of consciousness: awake and alert Pain management: pain level controlled Vital Signs Assessment: post-procedure vital signs reviewed and stable Respiratory status: spontaneous breathing Cardiovascular status: blood pressure returned to baseline and stable Postop Assessment: no apparent nausea or vomiting Anesthetic complications: no   No notable events documented.   Last Vitals:  Vitals:   11/12/21 1152  Pulse: 70  Resp: 14  Temp: 36.8 C  SpO2: 98%    Last Pain:  Vitals:   11/12/21 1152  TempSrc: Oral  PainSc: 0-No pain                 Isham Smitherman

## 2021-11-12 NOTE — Op Note (Signed)
Date of procedure: 11/12/21  Pre-operative diagnosis: Visually significant age-related cataract, Left Eye; Poor dilation, Left eye (H25.?2)   Post-operative diagnosis: Visually significant age-related cataract, Left Eye; Intra-operative Floppy Iris Syndrome, Left Eye (H21.81)  Procedure: Complex removal of cataract via phacoemulsification and insertion of intra-ocular lens Rayner [id number] +18.0D into the capsular bag of the Left Eye (CPT [phone])  Attending surgeon: Gerda Diss. Dalonda Simoni, MD, MA  Anesthesia: MAC, Topical [place]  Complications: None  Estimated Blood Loss: <84m (minimal)  Specimens: None  Implants: As above  Indications:  Visually significant cataract, Left Eye  Procedure:  The patient was seen and identified in the pre-operative area. The operative eye was identified and dilated.  The operative eye was marked.  Topical anesthesia was administered to the operative eye.     The patient was then to the operative suite and placed in the supine position.  A timeout was performed confirming the patient, procedure to be performed, and all other relevant information.   The patient's face was prepped and draped in the usual fashion for intra-ocular surgery.  A lid speculum was placed into the operative eye and the surgical microscope moved into place and focused.  Poor dilation of the iris was confirmed.  An inferotemporal paracentesis was created using a 20 gauge paracentesis blade.  Shugarcaine was injected into the anterior chamber. Vision Blue was used to stain the anterior capsule.  The Vision Blue was irrigated out of the eye using BSS.  Viscoelastic was injected into the anterior chamber.  A temporal clear-corneal main wound incision was created using a 2.457mmicrokeratome.  A Malyugin ring was placed.  A continuous curvilinear capsulorrhexis was initiated using an irrigating cystitome and completed using capsulorrhexis forceps.  Hydrodissection and hydrodeliniation were performed.   Viscoelastic was injected into the anterior chamber.  A phacoemulsification handpiece and a chopper as a second instrument were used to remove the nucleus and epinucleus. The irrigation/aspiration handpiece was used to remove any remaining cortical material.   The capsular bag was reinflated with viscoelastic, checked, and found to be intact.  The intraocular lens was inserted into the capsular bag and dialed into place using a MaSurveyor, mineralsThe Malyugin ring was removed.  The irrigation/aspiration handpiece was used to remove any remaining viscoelastic.  The clear corneal wound and paracentesis wounds were then hydrated and checked with Weck-Cels to be watertight.  The lid-speculum and drape was removed, and the patient's face was cleaned with a wet and dry 4x4.  Maxitrol was instilled in the eye. A clear shield was taped over the eye. The patient was taken to the post-operative care unit in good condition, having tolerated the procedure well.  Post-Op Instructions: [facility] and will receive all other orders and instructions.

## 2021-11-12 NOTE — Transfer of Care (Addendum)
Immediate Anesthesia Transfer of Care Note  Patient: [name]  Procedure(s) Performed: CATARACT EXTRACTION PHACO AND INTRAOCULAR LENS PLACEMENT ([facility]) (Left: Eye)  Patient Location: Short Stay  Anesthesia Type:MAC  Level of Consciousness: awake  Airway & Oxygen Therapy: Patient Spontanous Breathing  Post-op Assessment: Report given to RN  Post vital signs: Reviewed and stable  Last Vitals:  Vitals Value Taken Time  BP 129/58   Temp 36.4   Pulse 73   Resp 16   SpO2 100     Last Pain:  Vitals:   11/12/21 1152  TempSrc: Oral  PainSc: 0-No pain         Complications: No notable events documented.

## 2021-11-15 ENCOUNTER — Ambulatory Visit (HOSPITAL_COMMUNITY): Payer: Medicare Other | Attending: Internal Medicine | Admitting: Physical Therapy

## 2021-11-15 ENCOUNTER — Encounter (HOSPITAL_COMMUNITY): Payer: Self-pay | Admitting: Ophthalmology

## 2021-11-15 DIAGNOSIS — I872 Venous insufficiency (chronic) (peripheral): Secondary | ICD-10-CM | POA: Insufficient documentation

## 2021-11-15 DIAGNOSIS — R601 Generalized edema: Secondary | ICD-10-CM | POA: Insufficient documentation

## 2021-11-15 DIAGNOSIS — L97228 Non-pressure chronic ulcer of left calf with other specified severity: Secondary | ICD-10-CM | POA: Insufficient documentation

## 2021-11-15 NOTE — Therapy (Signed)
[place], [place], [address] Phone: [phone]   Fax:  [phone]  Wound Care Evaluation  Patient Details  Name: [name] MRN: [id number] Date of Birth: Oct 24, 1950 Referring Provider (PT): Stoney Bang   Encounter Date: 11/15/2021   PT End of Session - 11/15/21 1723     Visit Number 1    Number of Visits 8    Date for PT Re-Evaluation 12/15/21    Authorization Type UHC medicare;medicaid    Progress Note Due on Visit 8    PT Start Time 2951    PT Stop Time 1713    PT Time Calculation (min) 63 min    Activity Tolerance Patient tolerated treatment well    Behavior During Therapy [facility] for tasks assessed/performed             Past Medical History:  Diagnosis Date   Arthritis    Asthma    Cancer ([place])    Rectal    Chronic renal disease, stage 3, moderately decreased glomerular filtration rate between 30-59 mL/min/1.73 square meter ([place]) 04/27/2014   Depression    Diabetes mellitus    years   GERD (gastroesophageal reflux disease)    Gout    History of recurrent UTIs    Hypertension    Iron deficiency 04/27/2014   Mental retardation    stopped school at 9th grade    Numbness and tingling in hands    x several months    Rectal cancer ([place])    Seizures ([place])    more than 4 yrs since last seizure. UNknown etiology   Shortness of breath    with exertion   Vitamin B 12 deficiency 02/01/2015   Incidentally found without antibody testing.      Past Surgical History:  Procedure Laterality Date   ABDOMINAL HYSTERECTOMY     ABDOMINAL PERINEAL BOWEL RESECTION N/A 07/26/2013   Procedure:  ABDOMINAL PERINEAL RESECTION;  Surgeon: Jamesetta So, MD;  Location: [facility];  Service: General;  Laterality: N/A;   CATARACT EXTRACTION W/PHACO Left 11/12/2021   Procedure: CATARACT EXTRACTION PHACO AND INTRAOCULAR LENS PLACEMENT ([place]);  Surgeon: Baruch Goldmann, MD;  Location: [facility];  Service: Ophthalmology;   Laterality: Left;  CDE 6.24   COLON SURGERY  2015   bowel resection/w colostomy   COLONOSCOPY N/A 02/04/2013   Procedure: COLONOSCOPY;  Surgeon: Rogene Houston, MD;  Location: [facility];  Service: Endoscopy;  Laterality: N/A;  225   COLONOSCOPY N/A 05/12/2014   Procedure: COLONOSCOPY;  Surgeon: Rogene Houston, MD;  [facility];  Service: Endoscopy;  Laterality: N/A;  1030   COLONOSCOPY WITH ESOPHAGOGASTRODUODENOSCOPY (EGD) N/A 01/14/2013   Procedure: COLONOSCOPY WITH ESOPHAGOGASTRODUODENOSCOPY (EGD);  Surgeon: Rogene Houston, MD;  [facility];  Service: Endoscopy;  Laterality: N/A;  250-moved to 315 Ann to notify pt   COLOSTOMY Left 07/26/2013   Procedure: COLOSTOMY;  Surgeon: Jamesetta So, MD;  Location: [facility];  Service: General;  Laterality: Left;   EUS N/A 02/18/2013   Procedure: LOWER ENDOSCOPIC ULTRASOUND (EUS);  Surgeon: Milus Banister, MD;  Location: [facility];  Service: Endoscopy;  Laterality: N/A;   FLEXIBLE SIGMOIDOSCOPY N/A 07/26/2013   Procedure: FLEXIBLE SIGMOIDOSCOPY;  Surgeon: Jamesetta So, MD;  Location: [facility];  Service: General;  Laterality: N/A;   LYMPH NODE DISSECTION Left 10/26/2014   Procedure: LYMPH NODE DISSECTION;  Surgeon: [name], MD;  Location: [place];  Service: Thoracic;  Laterality: Left;   MULTIPLE EXTRACTIONS WITH ALVEOLOPLASTY N/A 11/23/2012   Procedure: MULTIPLE EXTRACION 1, 2, 4, 5, 6, 7, 8, 9, 10, 11, 12, 13, 14, 17, 18, 20, 23, 24, 25, 26, 28, 29, 32 WITH ALVEOLOPLASTY, REMOVE BILATERAL TORI;  Surgeon: Gae Bon, DDS;  Location: [place];  Service: Oral Surgery;  Laterality: N/A;   PORTACATH PLACEMENT Left 01/02/2015   Procedure: INSERTION PORT-A-CATH;  Surgeon: Aviva Signs, MD;  [facility];  Service: General;  Laterality: Left;   SALPINGOOPHORECTOMY Bilateral 07/26/2013   Procedure: SALPINGO OOPHORECTOMY;  Surgeon: Jamesetta So, MD;  [facility];  Service: General;  Laterality: Bilateral;    SEGMENTECOMY Left 10/26/2014   Procedure: LEFT LOWER LOBE SUPERIOR SEGMENTECTOMY;  Surgeon: [name], MD;  Location: [place];  Service: Thoracic;  Laterality: Left;   SUPRACERVICAL ABDOMINAL HYSTERECTOMY N/A 07/26/2013   Procedure: HYSTERECTOMY SUPRACERVICAL ABDOMINAL ;  Surgeon: Jamesetta So, MD;  [facility];  Service: General;  Laterality: N/A;   VIDEO ASSISTED THORACOSCOPY Left 10/26/2014   Procedure: LEFT VIDEO ASSISTED THORACOSCOPY;  Surgeon: [name], MD;  Location: [place];  Service: Thoracic;  Laterality: Left;    There were no vitals filed for this visit.     [facility] PT Assessment - 11/15/21 0001       Assessment   Medical Diagnosis B LE wounds    Referring Provider (PT) Stoney Bang    Onset Date/Surgical Date 10/30/21   chronic with acute exacerbation   Prior Therapy none      Precautions   Precautions --   cellulitis     Balance Screen   Has the patient fallen in the past 6 months No    Has the patient had a decrease in activity level because of a fear of falling?  Yes    Is the patient reluctant to leave their home because of a fear of falling?  Yes             Wound Therapy - 11/15/21 0001     Subjective PT states that she lives in an adult family care home.  She states that she has compression garments but is not using them.    Patient and Family Stated Goals legs to be smaller    Date of Onset 10/30/21   recent wound   Pain Scale 0-10    Pain Score 0-No pain    Evaluation and Treatment Procedures Explained to Patient/Family Yes    Evaluation and Treatment Procedures agreed to    Wound Properties Date First Assessed: 11/15/21 Time First Assessed: 1610 Wound Type: Venous stasis ulcer Location: Leg Location Orientation: Left;Lower Wound Description (Comments): medial aspect .4x.3 cm   Wound Image Images linked: 1    Dressing Type None    Dressing Changed New    Dressing Change Frequency PRN    % Wound base Red or Granulating 0%    %  Wound base Yellow/Fibrinous Exudate 100%    Peri-wound Assessment Edema;Hemosiderin;Induration   30CM: RT: 43.6; LT 48.8 cm; 20 cm: RT 39.5 cm, Lt 41.9cm; 10 cm: Rt 34 cm; Lt 36 cm.   Wound Length (cm) 0.4 cm    Wound Width (cm) 0.3 cm    Wound Surface Area (cm^2) 0.12 cm^2    Drainage Amount Scant    Treatment Cleansed;Other (Comment)   compression dressing.   Wound Therapy - Clinical Statement [name] is a 71 yo female who lives in a  group home.  Her care giver states that she has had edema in her legs for years but the pt will not elevate and she will not allow the staff to put compression garments on her legs.  She periodically gets wounds which occured a few weeks ago.  When her care giver took her to the MD he recommended the pt come to this facility.  The pt has noted fibrosis, venous leg ulcer , hyperplasia and induration indicative of secondary lymphedema.  [name] will benefit from skilled therapy for decongestive techniques and wound care.   Once maximum decongestion has occured pt will benefit from a juxta fit as this may be more comfortable for the pt and easier for the staff to don.    Factors Delaying/Impairing Wound Healing Altered sensation;Diabetes Mellitus;Polypharmacy;Multiple medical problems;Vascular compromise    Hydrotherapy Plan Patient/family education;Other (comment)    Wound Plan PT to recieve manual decongestion followed by application of profore compression system.  D/C with juxta fit compresion garment.    Dressing  B LE recieved profore compresison dressing.  Xeroform placed on Lt medial wound prior to dressing.                   Objective measurements completed on examination: See above findings.               PT Short Term Goals - 11/15/21 1716       PT SHORT TERM GOAL #1   Title PT wound to be healed    Time 2    Period Weeks    Status New    Target Date 11/22/21      PT SHORT TERM GOAL #2   Title Pt to have lost 2-3 cm  from measured areas to reduce risk of cellulitis.    Time 2    Period Weeks    Status New               PT Long Term Goals - 11/15/21 1717       PT LONG TERM GOAL #1   Title Pt to have lost 4-5 cm from areas measured to decrease risk of wounds and cellulitis.    Time 4    Period Weeks    Status New    Target Date 12/14/21      PT LONG TERM GOAL #2   Title PT to have acquired a juxtafit and staff to know how to don and doff and the importance of daily donning    Time 4    Period Weeks                  Plan - 11/15/21 1718     Clinical Impression Statement see above    Personal Factors and Comorbidities Comorbidity 3+    Comorbidities obesity, colostomy which care givers states comes loose often, venous stasis, diabetes, limited ability to understand what lymphedema is and how to care for it.    Examination-Activity Limitations Dressing    Examination-Participation Restrictions Other    Stability/Clinical Decision Making Evolving/Moderate complexity    Clinical Decision Making Moderate    Rehab Potential Good    PT Frequency 2x / week    PT Duration 4 weeks    PT Treatment/Interventions Compression bandaging;Manual lymph drainage;Manual techniques    PT Next Visit Plan see above.             Patient will benefit from skilled therapeutic intervention in order to improve the following deficits  and impairments:  Decreased skin integrity, Obesity, Increased edema  Visit Diagnosis: Venous stasis ulcer of left calf with other ulcer severity without varicose veins (HCC)  Generalized edema    Problem List Patient Active Problem List   Diagnosis Date Noted   Swelling 07/01/2019   Pain due to onychomycosis of toenails of both feet 08/27/2018   Diabetic neuropathy ([place]) 08/27/2018   Vitamin B 12 deficiency 02/01/2015   Lung nodule 10/26/2014   Iron deficiency 04/27/2014   Chronic renal disease, stage 3, moderately decreased glomerular filtration rate  between 30-59 mL/min/1.73 square meter ([place]) 04/27/2014   Incidental pulmonary nodule, greater than or equal to 48mm 04/26/2014   Abdominal pain 04/26/2014   Insomnia 04/26/2014   Rectal cancer metastasized to lung ([place]) 30/16/0109   Helicobacter pylori gastritis 02/08/2013   Elevated liver enzymes 12/02/2012   Anemia 08/29/2008   ASTHMA, WITH ACUTE EXACERBATION 07/01/2008   FLANK PAIN, LEFT 05/27/2008   SINUS TACHYCARDIA 03/02/2008   INTERTRIGO, CANDIDAL 10/19/2007   DIABETES MELLITUS, TYPE II, UNCONTROLLED 07/27/2007   LIVER FUNCTION TESTS, ABNORMAL 04/28/2007   UNSPECIFIED SLEEP DISTURBANCE 02/03/2007   DISEASE, PANCREAS NOS 11/20/2006   HYPERLIPIDEMIA 11/03/2006   GOUT NOS 11/03/2006   DEPRESSION 11/03/2006   RETARDATION, MENTAL NOS 11/03/2006   Essential hypertension 11/03/2006   ALLERGIC RHINITIS 11/03/2006   ASTHMA 11/03/2006   ARTHRITIS 11/03/2006   SEIZURE DISORDER 11/03/2006   MIGRAINES, HX OF 11/03/2006   Rayetta Humphrey, PT CLT [phone]  11/15/2021, 5:24 PM  [place], [place], [address] Phone: [phone]   Fax:  [phone]  Name: [name] MRN: [id number] Date of Birth: [birth year]

## 2021-11-20 DIAGNOSIS — Z933 Colostomy status: Secondary | ICD-10-CM | POA: Diagnosis not present

## 2021-11-20 DIAGNOSIS — R531 Weakness: Secondary | ICD-10-CM | POA: Diagnosis not present

## 2021-11-21 ENCOUNTER — Other Ambulatory Visit (HOSPITAL_COMMUNITY): Payer: Self-pay | Admitting: Internal Medicine

## 2021-11-21 ENCOUNTER — Encounter (HOSPITAL_COMMUNITY): Payer: Self-pay

## 2021-11-21 ENCOUNTER — Ambulatory Visit (HOSPITAL_COMMUNITY)
Admission: RE | Admit: 2021-11-21 | Discharge: 2021-11-21 | Disposition: A | Discharge status: Home / Self Care | Payer: Medicare Other | Source: Ambulatory Visit | Attending: Internal Medicine | Admitting: Internal Medicine

## 2021-11-21 ENCOUNTER — Ambulatory Visit (HOSPITAL_COMMUNITY): Payer: Medicare Other | Attending: Internal Medicine

## 2021-11-21 DIAGNOSIS — I872 Venous insufficiency (chronic) (peripheral): Secondary | ICD-10-CM | POA: Insufficient documentation

## 2021-11-21 DIAGNOSIS — R601 Generalized edema: Secondary | ICD-10-CM | POA: Diagnosis not present

## 2021-11-21 DIAGNOSIS — L97228 Non-pressure chronic ulcer of left calf with other specified severity: Secondary | ICD-10-CM | POA: Diagnosis not present

## 2021-11-21 DIAGNOSIS — M79631 Pain in right forearm: Secondary | ICD-10-CM | POA: Diagnosis not present

## 2021-11-21 NOTE — Therapy (Signed)
[facility] [place], [place], [address] Phone: [phone]   Fax:  [phone]  Wound Care Therapy  Patient Details  Name: [name] MRN: [id number] Date of Birth: 04-Feb-1951 Referring Provider (PT): Stoney Bang   Encounter Date: 11/21/2021   PT End of Session - 11/21/21 1809     Visit Number 2    Number of Visits 8    Date for PT Re-Evaluation 12/15/21    Authorization Type UHC medicare;medicaid    Progress Note Due on Visit 8    PT Start Time 1355   long restroom break   PT Stop Time 1430    PT Time Calculation (min) 35 min    Activity Tolerance Patient tolerated treatment well    Behavior During Therapy [facility] for tasks assessed/performed             Past Medical History:  Diagnosis Date   Arthritis    Asthma    Cancer ([place])    Rectal    Chronic renal disease, stage 3, moderately decreased glomerular filtration rate between 30-59 mL/min/1.73 square meter ([place]) 04/27/2014   Depression    Diabetes mellitus    years   GERD (gastroesophageal reflux disease)    Gout    History of recurrent UTIs    Hypertension    Iron deficiency 04/27/2014   Mental retardation    stopped school at 9th grade    Numbness and tingling in hands    x several months    Rectal cancer ([place])    Seizures ([place])    more than 4 yrs since last seizure. UNknown etiology   Shortness of breath    with exertion   Vitamin B 12 deficiency 02/01/2015   Incidentally found without antibody testing.      Past Surgical History:  Procedure Laterality Date   ABDOMINAL HYSTERECTOMY     ABDOMINAL PERINEAL BOWEL RESECTION N/A 07/26/2013   Procedure:  ABDOMINAL PERINEAL RESECTION;  Surgeon: Jamesetta So, MD;  Location: [facility];  Service: General;  Laterality: N/A;   CATARACT EXTRACTION W/PHACO Left 11/12/2021   Procedure: CATARACT EXTRACTION PHACO AND INTRAOCULAR LENS PLACEMENT ([place]);  Surgeon: Baruch Goldmann, MD;  Location: [facility];   Service: Ophthalmology;  Laterality: Left;  CDE 6.24   COLON SURGERY  2015   bowel resection/w colostomy   COLONOSCOPY N/A 02/04/2013   Procedure: COLONOSCOPY;  Surgeon: Rogene Houston, MD;  [facility];  Service: Endoscopy;  Laterality: N/A;  225   COLONOSCOPY N/A 05/12/2014   Procedure: COLONOSCOPY;  Surgeon: Rogene Houston, MD;  [facility];  Service: Endoscopy;  Laterality: N/A;  1030   COLONOSCOPY WITH ESOPHAGOGASTRODUODENOSCOPY (EGD) N/A 01/14/2013   Procedure: COLONOSCOPY WITH ESOPHAGOGASTRODUODENOSCOPY (EGD);  Surgeon: Rogene Houston, MD;  [facility];  Service: Endoscopy;  Laterality: N/A;  250-moved to 315 Ann to notify pt   COLOSTOMY Left 07/26/2013   Procedure: COLOSTOMY;  Surgeon: Jamesetta So, MD;  Location: [facility];  Service: General;  Laterality: Left;   EUS N/A 02/18/2013   Procedure: LOWER ENDOSCOPIC ULTRASOUND (EUS);  Surgeon: Milus Banister, MD;  Location: [facility];  Service: Endoscopy;  Laterality: N/A;   FLEXIBLE SIGMOIDOSCOPY N/A 07/26/2013   Procedure: FLEXIBLE SIGMOIDOSCOPY;  Surgeon: Jamesetta So, MD;  Location: [facility];  Service: General;  Laterality: N/A;   LYMPH NODE DISSECTION Left 10/26/2014   Procedure: LYMPH NODE DISSECTION;  Surgeon: [name], MD;  Location: [facility];  Service: Thoracic;  Laterality: Left;   MULTIPLE EXTRACTIONS WITH ALVEOLOPLASTY N/A 11/23/2012   Procedure: MULTIPLE EXTRACION 1, 2, 4, 5, 6, 7, 8, 9, 10, 11, 12, 13, 14, 17, 18, 20, 23, 24, 25, 26, 28, 29, 32 WITH ALVEOLOPLASTY, REMOVE BILATERAL TORI;  Surgeon: Gae Bon, DDS;  Location: [facility];  Service: Oral Surgery;  Laterality: N/A;   PORTACATH PLACEMENT Left 01/02/2015   Procedure: INSERTION PORT-A-CATH;  Surgeon: Aviva Signs, MD;  Location: [facility];  Service: General;  Laterality: Left;   SALPINGOOPHORECTOMY Bilateral 07/26/2013   Procedure: SALPINGO OOPHORECTOMY;  Surgeon: Jamesetta So, MD;  Location: [facility];  Service: General;   Laterality: Bilateral;   SEGMENTECOMY Left 10/26/2014   Procedure: LEFT LOWER LOBE SUPERIOR SEGMENTECTOMY;  Surgeon: [name], MD;  Location: [facility];  Service: Thoracic;  Laterality: Left;   SUPRACERVICAL ABDOMINAL HYSTERECTOMY N/A 07/26/2013   Procedure: HYSTERECTOMY SUPRACERVICAL ABDOMINAL ;  Surgeon: Jamesetta So, MD;  [facility];  Service: General;  Laterality: N/A;   VIDEO ASSISTED THORACOSCOPY Left 10/26/2014   Procedure: LEFT VIDEO ASSISTED THORACOSCOPY;  Surgeon: [name], MD;  Location: [facility];  Service: Thoracic;  Laterality: Left;    There were no vitals filed for this visit.               Wound Therapy - 11/21/21 0001     Subjective Pt arrived without dressing, reports she removed last night for a shower.    Patient and Family Stated Goals legs to be smaller    Date of Onset 10/30/21   recent wound   Pain Scale 0-10    Pain Score 0-No pain    Evaluation and Treatment Procedures Explained to Patient/Family Yes    Evaluation and Treatment Procedures agreed to    Wound Properties Date First Assessed: 11/15/21 Time First Assessed: 1610 Wound Type: Venous stasis ulcer Location: Leg Location Orientation: Left;Lower Wound Description (Comments): medial aspect .4x.3 cm   Dressing Type Compression wrap;Gauze (Comment);Impregnated gauze (bismuth)   vaseline, xeroform, profore lite, begin profore when in stock   Dressing Changed New    Dressing Status Dry    Dressing Change Frequency PRN    % Wound base Red or Granulating 0%    % Wound base Black/Eschar 100%    Peri-wound Assessment Edema;Hemosiderin;Induration    Drainage Amount --   pt removed dressings, unable to assess drainage   Treatment Cleansed   decongestive massage anterior only due to time, profore lite-   Wound Therapy - Clinical Statement Pt arrived with dressing removed, stated care giver removed for shower last night.  Pt presents with increased difficulty getting Rt LE onto bed,  required min A.  Pt educated on benefits with beginning exercise program at home for strengthening and to improve circulation, given HEP for ankle pumps, LAQ and marching.  Pt with increased time in restroom limiting time for manual this session.  Lymphedema decongestive techniques complete for inginual to axillary anastomosis complete including short neck, superficial and deep abdominal, axillary then BLE in supine position with induration present BLE.  Profore lite placed BLE for edema control, pt encouraged to leave on until next session unless soiled or gets wet, verbalized understanding.    Factors Delaying/Impairing Wound Healing Altered sensation;Diabetes Mellitus;Polypharmacy;Multiple medical problems;Vascular compromise    Hydrotherapy Plan Patient/family education;Other (comment)    Wound Plan PT to recieve manual decongestion followed by application of profore compression system.  D/C with juxta fit  compresion garment.    Dressing  Xeroform, profore lite (begin profore when back in stock)    Manual Therapy Manual lymph decongestive techniques: short neck, superficial and deep abdominal, axillary then BLE    Manual Therapy HEP: ankle pumps, LAQ, marching 10x each                       PT Short Term Goals - 11/15/21 1716       PT SHORT TERM GOAL #1   Title PT wound to be healed    Time 2    Period Weeks    Status New    Target Date 11/22/21      PT SHORT TERM GOAL #2   Title Pt to have lost 2-3 cm from measured areas to reduce risk of cellulitis.    Time 2    Period Weeks    Status New               PT Long Term Goals - 11/15/21 1717       PT LONG TERM GOAL #1   Title Pt to have lost 4-5 cm from areas measured to decrease risk of wounds and cellulitis.    Time 4    Period Weeks    Status New    Target Date 12/14/21      PT LONG TERM GOAL #2   Title PT to have acquired a juxtafit and staff to know how to don and doff and the importance of daily donning     Time 4    Period Weeks                    Patient will benefit from skilled therapeutic intervention in order to improve the following deficits and impairments:     Visit Diagnosis: Generalized edema  Venous stasis ulcer of left calf with other ulcer severity without varicose veins ([place])     Problem List Patient Active Problem List   Diagnosis Date Noted   Swelling 07/01/2019   Pain due to onychomycosis of toenails of both feet 08/27/2018   Diabetic neuropathy ([place]) 08/27/2018   Vitamin B 12 deficiency 02/01/2015   Lung nodule 10/26/2014   Iron deficiency 04/27/2014   Chronic renal disease, stage 3, moderately decreased glomerular filtration rate between 30-59 mL/min/1.73 square meter ([place]) 04/27/2014   Incidental pulmonary nodule, greater than or equal to 36mm 04/26/2014   Abdominal pain 04/26/2014   Insomnia 04/26/2014   Rectal cancer metastasized to lung ([place]) 32/95/1884   Helicobacter pylori gastritis 02/08/2013   Elevated liver enzymes 12/02/2012   Anemia 08/29/2008   ASTHMA, WITH ACUTE EXACERBATION 07/01/2008   FLANK PAIN, LEFT 05/27/2008   SINUS TACHYCARDIA 03/02/2008   INTERTRIGO, CANDIDAL 10/19/2007   DIABETES MELLITUS, TYPE II, UNCONTROLLED 07/27/2007   LIVER FUNCTION TESTS, ABNORMAL 04/28/2007   UNSPECIFIED SLEEP DISTURBANCE 02/03/2007   DISEASE, PANCREAS NOS 11/20/2006   HYPERLIPIDEMIA 11/03/2006   GOUT NOS 11/03/2006   DEPRESSION 11/03/2006   RETARDATION, MENTAL NOS 11/03/2006   Essential hypertension 11/03/2006   ALLERGIC RHINITIS 11/03/2006   ASTHMA 11/03/2006   ARTHRITIS 11/03/2006   SEIZURE DISORDER 11/03/2006   MIGRAINES, HX OF 11/03/2006   Ihor Austin, LPTA/CLT; CBIS [phone]  Aldona Lento, PTA 11/21/2021, 6:10 PM  [facility] [address] [place], [place], [address] Phone: [phone]   Fax:  [phone]  Name: [name] MRN: [id number] Date of Birth:  [birth year]

## 2021-11-22 ENCOUNTER — Encounter (HOSPITAL_COMMUNITY)
Admission: RE | Admit: 2021-11-22 | Discharge: 2021-11-22 | Disposition: A | Discharge status: Home / Self Care | Payer: Medicare Other | Source: Ambulatory Visit | Attending: Ophthalmology | Admitting: Ophthalmology

## 2021-11-22 DIAGNOSIS — H25811 Combined forms of age-related cataract, right eye: Secondary | ICD-10-CM | POA: Diagnosis not present

## 2021-11-22 NOTE — Patient Instructions (Signed)
Danaiya Steadman  11/22/2021     @PREFPERIOPPHARMACY @   Your procedure is scheduled on  11/26/2021.   Report to [facility] at  1210  P.M.   Call this number if you have problems the morning of surgery:  [phone]   Remember:  Do not eat or drink after midnight.          Take 1/2 dose of your night time Lantus (6 units), the night before your procedure.      Use your nebulizer and your inhaler before you come and bring your rescue inhaler with you.       DO NOT take any medications for diabetes the morning of your procedure.     Take these medicines the morning of surgery with A SIP OF WATER               allopurinol, dilantin, norvasc, aricept, lexapro, toprol, protonix.     Do not wear jewelry, make-up or nail polish.  Do not wear lotions, powders, or perfumes, or deodorant.  Do not shave 48 hours prior to surgery.  Men may shave face and neck.  Do not bring valuables to the hospital.  [facility] is not responsible for any belongings or valuables.  Contacts, dentures or bridgework may not be worn into surgery.  Leave your suitcase in the car.  After surgery it may be brought to your room.  For patients admitted to the hospital, discharge time will be determined by your treatment team.  Patients discharged the day of surgery will not be allowed to drive home and must have someone with them for 24 hours.    Special instructions:   DO NOT smoke tobacco or vape for 24 hours before your procedure.  Please read over the following fact sheets that you were given. Coughing and Deep Breathing, Anesthesia Post-op Instructions, and Care and Recovery After Surgery      General Anesthesia, Adult, Care After The following information offers guidance on how to care for yourself after your procedure. Your health care provider may also give you more specific instructions. If you have problems or questions, contact your health care provider. What can I expect after the  procedure? After the procedure, it is common for people to: Have pain or discomfort at the IV site. Have nausea or vomiting. Have a sore throat or hoarseness. Have trouble concentrating. Feel cold or chills. Feel weak, sleepy, or tired (fatigue). Have soreness and body aches. These can affect parts of the body that were not involved in surgery. Follow these instructions at home: For the time period you were told by your health care provider:  Rest. Do not participate in activities where you could fall or become injured. Do not drive or use machinery. Do not drink alcohol. Do not take sleeping pills or medicines that cause drowsiness. Do not make important decisions or sign legal documents. Do not take care of children on your own. General instructions Drink enough fluid to keep your urine pale yellow. If you have sleep apnea, surgery and certain medicines can increase your risk for breathing problems. Follow instructions from your health care provider about wearing your sleep device: Anytime you are sleeping, including during daytime naps. While taking prescription pain medicines, sleeping medicines, or medicines that make you drowsy. Return to your normal activities as told by your health care provider. Ask your health care provider what activities are safe for you. Take over-the-counter and prescription medicines only as told by  your health care provider. Do not use any products that contain nicotine or tobacco. These products include cigarettes, chewing tobacco, and vaping devices, such as e-cigarettes. These can delay incision healing after surgery. If you need help quitting, ask your health care provider. Contact a health care provider if: You have nausea or vomiting that does not get better with medicine. You vomit every time you eat or drink. You have pain that does not get better with medicine. You cannot urinate or have bloody urine. You develop a skin rash. You have a  fever. Get help right away if: You have trouble breathing. You have chest pain. You vomit blood. These symptoms may be an emergency. Get help right away. Call 911. Do not wait to see if the symptoms will go away. Do not drive yourself to the hospital. Summary After the procedure, it is common to have a sore throat, hoarseness, nausea, vomiting, or to feel weak, sleepy, or fatigue. For the time period you were told by your health care provider, do not drive or use machinery. Get help right away if you have difficulty breathing, have chest pain, or vomit blood. These symptoms may be an emergency. This information is not intended to replace advice given to you by your health care provider. Make sure you discuss any questions you have with your health care provider. Document Revised: 06/01/2021 Document Reviewed: 06/01/2021 Elsevier Patient Education  [place].

## 2021-11-23 ENCOUNTER — Encounter (HOSPITAL_COMMUNITY)
Admission: RE | Admit: 2021-11-23 | Discharge: 2021-11-23 | Disposition: A | Discharge status: Home / Self Care | Payer: Medicare Other | Source: Ambulatory Visit | Attending: Ophthalmology | Admitting: Ophthalmology

## 2021-11-23 ENCOUNTER — Encounter (HOSPITAL_COMMUNITY): Payer: Self-pay

## 2021-11-23 VITALS — HR 60 | Temp 97.5°F | Resp 18 | Ht 66.0 in | Wt 233.7 lb

## 2021-11-23 DIAGNOSIS — Z01818 Encounter for other preprocedural examination: Secondary | ICD-10-CM | POA: Diagnosis not present

## 2021-11-23 DIAGNOSIS — E611 Iron deficiency: Secondary | ICD-10-CM

## 2021-11-23 DIAGNOSIS — E1121 Type 2 diabetes mellitus with diabetic nephropathy: Secondary | ICD-10-CM | POA: Diagnosis not present

## 2021-11-23 DIAGNOSIS — I10 Essential (primary) hypertension: Secondary | ICD-10-CM | POA: Insufficient documentation

## 2021-11-23 DIAGNOSIS — D631 Anemia in chronic kidney disease: Secondary | ICD-10-CM | POA: Insufficient documentation

## 2021-11-23 DIAGNOSIS — E1122 Type 2 diabetes mellitus with diabetic chronic kidney disease: Secondary | ICD-10-CM | POA: Diagnosis not present

## 2021-11-23 DIAGNOSIS — N1832 Chronic kidney disease, stage 3b: Secondary | ICD-10-CM | POA: Insufficient documentation

## 2021-11-23 HISTORY — DX: Malignant neoplasm of unspecified part of unspecified bronchus or lung: C34.90

## 2021-11-23 LAB — CBC WITH DIFFERENTIAL/PLATELET
Abs Immature Granulocytes: 0.01 10*3/uL (ref 0.00–0.07)
Basophils Absolute: 0 10*3/uL (ref 0.0–0.1)
Basophils Relative: 1 %
Eosinophils Absolute: 0.3 10*3/uL (ref 0.0–0.5)
Eosinophils Relative: 6 %
HCT: 29.8 % — ABNORMAL LOW (ref 36.0–46.0)
Hemoglobin: 9.5 g/dL — ABNORMAL LOW (ref 12.0–15.0)
Immature Granulocytes: 0 %
Lymphocytes Relative: 11 %
Lymphs Abs: 0.5 10*3/uL — ABNORMAL LOW (ref 0.7–4.0)
MCH: 27.9 pg (ref 26.0–34.0)
MCHC: 31.9 g/dL (ref 30.0–36.0)
MCV: 87.6 fL (ref 80.0–100.0)
Monocytes Absolute: 0.4 10*3/uL (ref 0.1–1.0)
Monocytes Relative: 8 %
Neutro Abs: 3.5 10*3/uL (ref 1.7–7.7)
Neutrophils Relative %: 74 %
Platelets: 210 10*3/uL (ref 150–400)
RBC: 3.4 MIL/uL — ABNORMAL LOW (ref 3.87–5.11)
RDW: 15.1 % (ref 11.5–15.5)
WBC: 4.7 10*3/uL (ref 4.0–10.5)
nRBC: 0 % (ref 0.0–0.2)

## 2021-11-23 LAB — BASIC METABOLIC PANEL
Anion gap: 11 (ref 5–15)
BUN: 59 mg/dL — ABNORMAL HIGH (ref 8–23)
CO2: 26 mmol/L (ref 22–32)
Calcium: 8.9 mg/dL (ref 8.9–10.3)
Chloride: 94 mmol/L — ABNORMAL LOW (ref 98–111)
Creatinine, Ser: 2.47 mg/dL — ABNORMAL HIGH (ref 0.44–1.00)
GFR, Estimated: 20 mL/min — ABNORMAL LOW (ref >60)
Glucose, Bld: 117 mg/dL — ABNORMAL HIGH (ref 70–99)
Potassium: 4.4 mmol/L (ref 3.5–5.1)
Sodium: 131 mmol/L — ABNORMAL LOW (ref 135–145)

## 2021-11-23 LAB — HEMOGLOBIN A1C
Hgb A1c MFr Bld: 6.3 % — ABNORMAL HIGH (ref 4.8–5.6)
Mean Plasma Glucose: 134.11 mg/dL

## 2021-11-23 NOTE — H&P (Signed)
Surgical History & Physical  Patient Name: [name] DOB: 1951-01-19  Surgery: Cataract extraction with intraocular lens implant phacoemulsification; Right Eye  Surgeon: Baruch Goldmann MD Surgery Date:  11-26-21 Pre-Op Date:  11-15-21  HPI: A 48 Yr. old female patient is accompanied by caregiver, patient has dementia 1. The patient is returning after cataract post-op. The left eye is affected. Status post cataract post-op, which began 3 days ago: Since the last visit, the affected area is doing well. The patient's vision is stable. Patient is following medication instructions. . The patient's vision is blurry. The complaint is associated with difficulty seeing captions on TV or poor night vision. This is negatively affecting the patient's quality of life and the patient is unable to function adequately in life with the current level of vision. HPI was performed by Baruch Goldmann .  Medical History:  Cataracts Cancer Dementia Diabetes - DM Type 2 High Blood Pressure  Review of Systems Respiratory Asthma All recorded systems are negative except as noted above.  Social   Never smoked   Medication Moxifloxacin, Ilevro,  Amlodipine, Allopurinol, Aspirin, Calcium carb, Docusate sodium, Donepezil, Escitalopram, Ferosul, Fish oil, Fluticasone/salm, Furosemide, Loratadine, Metformin, Metoprolol, Montelukast sodium, Nyamyc, Nystatin, Pantoprazole sod, Phenytoin, Pravastatin, Trazodone, Triamcinolone, Vitamin b12, Vitamin d, Albuterol, Levalbuterol, Novofine autocover,   Sx/Procedures Phaco c IOL OS,  Colon Cancer SX, Colostomy,   Drug Allergies   NKDA  History & Physical: Heent: Cataract, right eye NECK: supple without bruits LUNGS: lungs clear to auscultation CV: regular rate and rhythm Abdomen: soft and non-tender Impression & Plan: Assessment: 1.  CATARACT EXTRACTION STATUS; Left Eye (Z98.42) 2.  COMBINED FORMS AGE RELATED CATARACT; Right Eye (H25.811)  Plan: 1.  1 week  after cataract surgery. Doing well with improved vision and normal eye pressure. Call with any problems or concerns. Continue Pred-Moxi-Brom 2x/day for 3 more weeks.  2.  Cataract accounts for the patient's decreased vision. This visual impairment is not correctable with a tolerable change in glasses or contact lenses. Cataract surgery with an implantation of a new lens should significantly improve the visual and functional status of the patient. Discussed all risks, benefits, alternatives, and potential complications. Discussed the procedures and recovery. Patient desires to have surgery. A-scan ordered and performed today for intra-ocular lens calculations. The surgery will be performed in order to improve vision for driving, reading, and for eye examinations. Recommend phacoemulsification with intra-ocular lens. Recommend Dextenza for post-operative pain and inflammation. Right Eye. Surgery required to correct imbalance of vision. Dilates poorly - shugarcaine by protocol. Malyugin Ring. Omidira. Poor airway, difficult to keep eye still - anesthesia requested an LMA (general) for second eye.

## 2021-11-26 ENCOUNTER — Encounter (HOSPITAL_COMMUNITY): Payer: Self-pay | Admitting: Ophthalmology

## 2021-11-26 ENCOUNTER — Ambulatory Visit (HOSPITAL_COMMUNITY): Payer: Medicare Other | Admitting: Anesthesiology

## 2021-11-26 ENCOUNTER — Ambulatory Visit (HOSPITAL_BASED_OUTPATIENT_CLINIC_OR_DEPARTMENT_OTHER): Payer: Medicare Other | Admitting: Anesthesiology

## 2021-11-26 ENCOUNTER — Encounter (HOSPITAL_COMMUNITY)
Admission: RE | Disposition: A | Discharge status: Home / Self Care | Payer: Self-pay | Source: Ambulatory Visit | Attending: Ophthalmology

## 2021-11-26 ENCOUNTER — Ambulatory Visit (HOSPITAL_COMMUNITY)
Admission: RE | Admit: 2021-11-26 | Discharge: 2021-11-26 | Disposition: A | Discharge status: Home / Self Care | Payer: Medicare Other | Source: Ambulatory Visit | Attending: Ophthalmology | Admitting: Ophthalmology

## 2021-11-26 DIAGNOSIS — R569 Unspecified convulsions: Secondary | ICD-10-CM | POA: Diagnosis not present

## 2021-11-26 DIAGNOSIS — I10 Essential (primary) hypertension: Secondary | ICD-10-CM | POA: Insufficient documentation

## 2021-11-26 DIAGNOSIS — J45909 Unspecified asthma, uncomplicated: Secondary | ICD-10-CM | POA: Insufficient documentation

## 2021-11-26 DIAGNOSIS — Z7984 Long term (current) use of oral hypoglycemic drugs: Secondary | ICD-10-CM | POA: Insufficient documentation

## 2021-11-26 DIAGNOSIS — H2181 Floppy iris syndrome: Secondary | ICD-10-CM | POA: Insufficient documentation

## 2021-11-26 DIAGNOSIS — H25811 Combined forms of age-related cataract, right eye: Secondary | ICD-10-CM | POA: Diagnosis not present

## 2021-11-26 DIAGNOSIS — H259 Unspecified age-related cataract: Secondary | ICD-10-CM

## 2021-11-26 DIAGNOSIS — K219 Gastro-esophageal reflux disease without esophagitis: Secondary | ICD-10-CM | POA: Insufficient documentation

## 2021-11-26 DIAGNOSIS — E1136 Type 2 diabetes mellitus with diabetic cataract: Secondary | ICD-10-CM | POA: Diagnosis not present

## 2021-11-26 DIAGNOSIS — F0390 Unspecified dementia without behavioral disturbance: Secondary | ICD-10-CM | POA: Insufficient documentation

## 2021-11-26 DIAGNOSIS — Z9842 Cataract extraction status, left eye: Secondary | ICD-10-CM | POA: Insufficient documentation

## 2021-11-26 DIAGNOSIS — E1121 Type 2 diabetes mellitus with diabetic nephropathy: Secondary | ICD-10-CM

## 2021-11-26 DIAGNOSIS — Z85118 Personal history of other malignant neoplasm of bronchus and lung: Secondary | ICD-10-CM | POA: Diagnosis not present

## 2021-11-26 DIAGNOSIS — Z87891 Personal history of nicotine dependence: Secondary | ICD-10-CM

## 2021-11-26 DIAGNOSIS — Z85048 Personal history of other malignant neoplasm of rectum, rectosigmoid junction, and anus: Secondary | ICD-10-CM | POA: Diagnosis not present

## 2021-11-26 DIAGNOSIS — D631 Anemia in chronic kidney disease: Secondary | ICD-10-CM

## 2021-11-26 DIAGNOSIS — Z933 Colostomy status: Secondary | ICD-10-CM | POA: Insufficient documentation

## 2021-11-26 HISTORY — PX: CATARACT EXTRACTION W/PHACO: SHX586

## 2021-11-26 LAB — GLUCOSE, CAPILLARY
Glucose-Capillary: 100 mg/dL — ABNORMAL HIGH (ref 70–99)
Glucose-Capillary: 99 mg/dL (ref 70–99)

## 2021-11-26 SURGERY — PHACOEMULSIFICATION, CATARACT, WITH IOL INSERTION
Anesthesia: General | Site: Eye | Laterality: Right

## 2021-11-26 MED ORDER — TETRACAINE HCL 0.5 % OP SOLN
1.0000 [drp] | OPHTHALMIC | Status: AC | PRN
  Administered 2021-11-26 (×3): 1 [drp] via OPHTHALMIC

## 2021-11-26 MED ORDER — EPINEPHRINE PF 1 MG/ML IJ SOLN
INTRAMUSCULAR | Status: AC
  Filled 2021-11-26: qty 1

## 2021-11-26 MED ORDER — TRYPAN BLUE 0.06 % IO SOSY
PREFILLED_SYRINGE | INTRAOCULAR | Status: DC | PRN
  Administered 2021-11-26: 0.5 mL via INTRAOCULAR

## 2021-11-26 MED ORDER — ONDANSETRON HCL 4 MG/2ML IJ SOLN
INTRAMUSCULAR | Status: AC
  Filled 2021-11-26: qty 2

## 2021-11-26 MED ORDER — PROPOFOL 10 MG/ML IV BOLUS
INTRAVENOUS | Status: DC | PRN
  Administered 2021-11-26: 100 mg via INTRAVENOUS

## 2021-11-26 MED ORDER — TROPICAMIDE 1 % OP SOLN
1.0000 [drp] | OPHTHALMIC | Status: AC | PRN
  Administered 2021-11-26 (×3): 1 [drp] via OPHTHALMIC

## 2021-11-26 MED ORDER — LIDOCAINE HCL (PF) 2 % IJ SOLN
INTRAMUSCULAR | Status: AC
  Filled 2021-11-26: qty 5

## 2021-11-26 MED ORDER — TRYPAN BLUE 0.06 % IO SOSY
PREFILLED_SYRINGE | INTRAOCULAR | Status: AC
  Filled 2021-11-26: qty 0.5

## 2021-11-26 MED ORDER — LACTATED RINGERS IV SOLN: INTRAVENOUS | Status: DC

## 2021-11-26 MED ORDER — PHENYLEPHRINE-KETOROLAC 1-0.3 % IO SOLN
INTRAOCULAR | Status: AC
  Filled 2021-11-26: qty 4

## 2021-11-26 MED ORDER — METOCLOPRAMIDE HCL 5 MG/ML IJ SOLN
10.0000 mg | Freq: Once | INTRAMUSCULAR | Status: AC
  Administered 2021-11-26: 10 mg via INTRAVENOUS

## 2021-11-26 MED ORDER — PHENYLEPHRINE HCL 2.5 % OP SOLN
1.0000 [drp] | OPHTHALMIC | Status: AC | PRN
Start: 2021-11-26 — End: 2021-11-26
  Administered 2021-11-26 (×3): 1 [drp] via OPHTHALMIC

## 2021-11-26 MED ORDER — ONDANSETRON HCL 4 MG/2ML IJ SOLN
INTRAMUSCULAR | Status: DC | PRN
  Administered 2021-11-26: 4 mg via INTRAVENOUS

## 2021-11-26 MED ORDER — EPHEDRINE SULFATE (PRESSORS) 50 MG/ML IJ SOLN
INTRAMUSCULAR | Status: DC | PRN
  Administered 2021-11-26 (×2): 10 mg via INTRAVENOUS

## 2021-11-26 MED ORDER — PHENYLEPHRINE-KETOROLAC 1-0.3 % IO SOLN
INTRAOCULAR | Status: DC | PRN
  Administered 2021-11-26: 500 mL via OPHTHALMIC

## 2021-11-26 MED ORDER — ONDANSETRON HCL 4 MG/2ML IJ SOLN
4.0000 mg | Freq: Once | INTRAMUSCULAR | Status: DC | PRN
Start: 2021-11-26 — End: 2021-11-26

## 2021-11-26 MED ORDER — POVIDONE-IODINE 5 % OP SOLN
OPHTHALMIC | Status: DC | PRN
  Administered 2021-11-26: 1 via OPHTHALMIC

## 2021-11-26 MED ORDER — MOXIFLOXACIN HCL 0.5 % OP SOLN
OPHTHALMIC | Status: DC | PRN
  Administered 2021-11-26: 2 [drp] via OPHTHALMIC

## 2021-11-26 MED ORDER — FENTANYL CITRATE (PF) 100 MCG/2ML IJ SOLN
INTRAMUSCULAR | Status: DC | PRN
  Administered 2021-11-26: 50 ug via INTRAVENOUS

## 2021-11-26 MED ORDER — FENTANYL CITRATE PF 50 MCG/ML IJ SOSY: 25.0000 ug | PREFILLED_SYRINGE | INTRAMUSCULAR | Status: DC | PRN

## 2021-11-26 MED ORDER — METOCLOPRAMIDE HCL 5 MG/ML IJ SOLN
INTRAMUSCULAR | Status: AC
  Filled 2021-11-26: qty 2

## 2021-11-26 MED ORDER — LIDOCAINE HCL (CARDIAC) PF 100 MG/5ML IV SOSY
PREFILLED_SYRINGE | INTRAVENOUS | Status: DC | PRN
  Administered 2021-11-26: 100 mg via INTRAVENOUS

## 2021-11-26 MED ORDER — SODIUM HYALURONATE 10 MG/ML IO SOLUTION
PREFILLED_SYRINGE | INTRAOCULAR | Status: DC | PRN
  Administered 2021-11-26: 0.85 mL via INTRAOCULAR

## 2021-11-26 MED ORDER — FENTANYL CITRATE (PF) 100 MCG/2ML IJ SOLN
INTRAMUSCULAR | Status: AC
  Filled 2021-11-26: qty 2

## 2021-11-26 MED ORDER — PROPOFOL 10 MG/ML IV BOLUS
INTRAVENOUS | Status: AC
  Filled 2021-11-26: qty 20

## 2021-11-26 MED ORDER — STERILE WATER FOR IRRIGATION IR SOLN
Status: DC | PRN
  Administered 2021-11-26: 500 mL

## 2021-11-26 MED ORDER — SODIUM HYALURONATE 23MG/ML IO SOSY
PREFILLED_SYRINGE | INTRAOCULAR | Status: DC | PRN
  Administered 2021-11-26: 0.6 mL via INTRAOCULAR

## 2021-11-26 MED ORDER — BSS IO SOLN
INTRAOCULAR | Status: DC | PRN
  Administered 2021-11-26: 15 mL via INTRAOCULAR

## 2021-11-26 MED ORDER — LIDOCAINE HCL 3.5 % OP GEL
1.0000 | Freq: Once | OPHTHALMIC | Status: AC
  Administered 2021-11-26: 1 via OPHTHALMIC

## 2021-11-26 SURGICAL SUPPLY — 16 items
CATARACT SUITE SIGHTPATH (MISCELLANEOUS) ×1 IMPLANT
CLOTH BEACON ORANGE TIMEOUT ST (SAFETY) ×2 IMPLANT
DRAPE HALF SHEET 40X57 (DRAPES) IMPLANT
EYE SHIELD UNIVERSAL CLEAR (GAUZE/BANDAGES/DRESSINGS) IMPLANT
FEE CATARACT SUITE SIGHTPATH (MISCELLANEOUS) ×2 IMPLANT
GLOVE BIOGEL PI IND STRL 7.0 (GLOVE) ×4 IMPLANT
LENS IOL RAYNER 19.5 (Intraocular Lens) ×1 IMPLANT
LENS IOL RAYONE EMV 19.5 (Intraocular Lens) IMPLANT
NDL HYPO 18GX1.5 BLUNT FILL (NEEDLE) ×2 IMPLANT
NEEDLE HYPO 18GX1.5 BLUNT FILL (NEEDLE) ×1 IMPLANT
PAD ARMBOARD 7.5X6 YLW CONV (MISCELLANEOUS) ×2 IMPLANT
RING MALYGIN 7.0 (MISCELLANEOUS) IMPLANT
SYR TB 1ML LL NO SAFETY (SYRINGE) ×2 IMPLANT
TAPE SURG TRANSPORE 1 IN (GAUZE/BANDAGES/DRESSINGS) IMPLANT
TAPE SURGICAL TRANSPORE 1 IN (GAUZE/BANDAGES/DRESSINGS) ×1
WATER STERILE IRR 500ML POUR (IV SOLUTION) IMPLANT

## 2021-11-26 NOTE — Discharge Instructions (Addendum)
Please discharge patient when stable, will follow up today with Dr. Wrzosek at the [facility] office immediately following discharge.  Leave shield in place until visit.  All paperwork with discharge instructions will be given at the office.  [facility] Address:  [address]  , [place] [address]  

## 2021-11-26 NOTE — Anesthesia Postprocedure Evaluation (Signed)
Anesthesia Post Note  Patient: Nya Monds  Procedure(s) Performed: CATARACT EXTRACTION PHACO AND INTRAOCULAR LENS PLACEMENT (I[facility]) (Right: Eye)  Patient location during evaluation: PACU Anesthesia Type: General Level of consciousness: awake and alert and oriented Pain management: pain level controlled Vital Signs Assessment: post-procedure vital signs reviewed and stable Respiratory status: spontaneous breathing, nonlabored ventilation and respiratory function stable Cardiovascular status: blood pressure returned to baseline and stable Postop Assessment: no apparent nausea or vomiting Anesthetic complications: no   No notable events documented.   Last Vitals:  Vitals:   11/26/21 1231 11/26/21 1432  BP: (!) 154/61 (!) 127/58  Pulse: 62 64  Resp: 12 16  Temp: 36.8 C 36.4 C  SpO2: 98% 97%    Last Pain:  Vitals:   11/26/21 1432  TempSrc: Oral  PainSc: 0-No pain                 Kalif Kattner C Cantrell [place]

## 2021-11-26 NOTE — Transfer of Care (Signed)
Immediate Anesthesia Transfer of Care Note  Patient: [name]  Procedure(s) Performed: CATARACT EXTRACTION PHACO AND INTRAOCULAR LENS PLACEMENT ([facility]) (Right: Eye)  Patient Location: PACU and Short Stay  Anesthesia Type:General  Level of Consciousness: awake, alert , oriented and patient cooperative  Airway & Oxygen Therapy: Patient Spontanous Breathing  Post-op Assessment: Report given to RN, Post -op Vital signs reviewed and stable and Patient moving all extremities X 4  Post vital signs: Reviewed and stable  Last Vitals:  Vitals Value Taken Time  BP    Temp    Pulse    Resp    SpO2      Last Pain:  Vitals:   11/26/21 1231  TempSrc: Oral  PainSc: 0-No pain         Complications: No notable events documented.

## 2021-11-26 NOTE — Anesthesia Preprocedure Evaluation (Signed)
Anesthesia Evaluation  Patient identified by MRN, date of birth, ID band Patient awake    Reviewed: Allergy & Precautions, NPO status , Patient's Chart, lab work & pertinent test results, reviewed documented beta blocker date and time   Airway Mallampati: II  TM Distance: >3 FB Neck ROM: Full    Dental  (+) Dental Advisory Given   Pulmonary shortness of breath and with exertion, asthma , former smoker,  Lung cancer   Pulmonary exam normal breath sounds clear to auscultation       Cardiovascular hypertension, Pt. on medications and Pt. on home beta blockers Normal cardiovascular exam Rhythm:Regular Rate:Normal  23-Nov-2021 09:17:13 [facility] ROUTINE RECORD 03/14/1951 (71 yr) Female Black PR interval 198 ms QRS duration 84 ms QT/QTcB 410/412 ms P-R-T axes 16 -27 43 Normal sinus rhythm Minimal voltage criteria for LVH, may be normal variant ( R in aVL ) Inferior infarct , age undetermined Anterior infarct , age undetermined Abnormal ECG When compared with ECG of 24-Oct-2014 15:37, PREVIOUS ECG IS PRESENT No significant change since last tracing Confirmed by Carlyle Dolly [phone]) on 11/24/2021 1:08:10 PM Referred by: Stoney Bang Confirmed By: Carlyle Dolly    Neuro/Psych Seizures -,  PSYCHIATRIC DISORDERS Depression  Neuromuscular disease    GI/Hepatic Neg liver ROS, GERD  Medicated and Controlled,Rectal cancer, colostomy   Endo/Other  diabetes, Well Controlled, Type 2, Oral Hypoglycemic Agents  Renal/GU Renal InsufficiencyRenal disease  negative genitourinary   Musculoskeletal  (+) Arthritis , Osteoarthritis,    Abdominal   Peds negative pediatric ROS (+)  Hematology  (+) Blood dyscrasia, anemia ,   Anesthesia Other Findings   Reproductive/Obstetrics negative OB ROS                            Anesthesia Physical Anesthesia Plan  ASA: 3  Anesthesia  Plan: General   Post-op Pain Management: Minimal or no pain anticipated   Induction: Intravenous  PONV Risk Score and Plan: 4 or greater and Ondansetron  Airway Management Planned: Oral ETT and LMA  Additional Equipment:   Intra-op Plan:   Post-operative Plan: Extubation in OR  Informed Consent: I have reviewed the patients History and Physical, chart, labs and discussed the procedure including the risks, benefits and alternatives for the proposed anesthesia with the patient or authorized representative who has indicated his/her understanding and acceptance.     Dental advisory given  Plan Discussed with: CRNA and Surgeon  Anesthesia Plan Comments:        Anesthesia Quick Evaluation

## 2021-11-26 NOTE — Op Note (Signed)
Date of procedure: 11/26/21  Pre-operative diagnosis: Visually significant age-related combined cataract, Right Eye; Poor Dilation, Right Eye (H25.811)  Post-operative diagnosis: Visually significant age-related cataract, Right Eye; Intra-operative Floppy Iris Syndrome, Right Eye (H21.81)  Procedure: Removal of cataract via phacoemulsification and insertion of intra-ocular lens Rayner [id number] +19.5D into the capsular bag of the Right Eye (CPT [phone])  Attending surgeon: Gerda Diss. Lasaundra Riche, MD, MA  Anesthesia: General, Topical Akten  Complications: None  Estimated Blood Loss: <5mL (minimal)  Specimens: None  Implants: As above  Indications:  Visually significant cataract, Right Eye  Procedure:  The patient was seen and identified in the pre-operative area. The operative eye was identified and dilated.  The operative eye was marked.  Topical anesthesia was administered to the operative eye.     The patient was then to the operative suite and placed in the supine position. General anesthesia was administered.  A timeout was performed confirming the patient, procedure to be performed, and all other relevant information.   The patient's face was prepped and draped in the usual fashion for intra-ocular surgery.  A lid speculum was placed into the operative eye and the surgical microscope moved into place and focused.  Poor dilation of the iris was confirmed.  A superotemporal paracentesis was created using a 20 gauge paracentesis blade. Vision Blue was used to stain the anterior capsule. Omidria was injected into the anterior chamber to irrigate out the Vision Blue.  Viscoelastic was injected into the anterior chamber.  A temporal clear-corneal main wound incision was created using a 2.7mm microkeratome.  A Malyugin ring was placed.  A continuous curvilinear capsulorrhexis was initiated using an irrigating cystitome and completed using capsulorrhexis forceps.  Hydrodissection and hydrodeliniation  were performed.  Viscoelastic was injected into the anterior chamber.  A phacoemulsification handpiece and a chopper as a second instrument were used to remove the nucleus and epinucleus. The irrigation/aspiration handpiece was used to remove any remaining cortical material.   The capsular bag was reinflated with viscoelastic, checked, and found to be intact.  The intraocular lens was inserted into the capsular bag and dialed into place using a Surveyor, minerals.  The Malyugin ring was removed.  The irrigation/aspiration handpiece was used to remove any remaining viscoelastic.  The clear corneal wound and paracentesis wounds were then hydrated and checked with Weck-Cels to be watertight.  The lid-speculum and drape was removed, and the patient's face was cleaned with a wet and dry 4x4.  Moxifloxacin drops were instilled on the eye. A clear shield was taped over the eye. The patient was taken to the post-operative care unit in good condition, having tolerated the procedure well.  Post-Op Instructions: The patient will follow up at [facility] for a same day post-operative evaluation and will receive all other orders and instructions.

## 2021-11-26 NOTE — Anesthesia Procedure Notes (Signed)
Procedure Name: LMA Insertion Date/Time: 11/26/2021 1:59 PM  Performed by: Jonna Munro, CRNAPre-anesthesia Checklist: Patient identified, Emergency Drugs available, Suction available, Patient being monitored and Timeout performed Patient Re-evaluated:Patient Re-evaluated prior to induction Oxygen Delivery Method: Circle system utilized Preoxygenation: Pre-oxygenation with 100% oxygen Induction Type: IV induction LMA: LMA inserted LMA Size: 3.0 Number of attempts: 1 Placement Confirmation: positive ETCO2, CO2 detector and breath sounds checked- equal and bilateral Tube secured with: Tape Dental Injury: Teeth and Oropharynx as per pre-operative assessment

## 2021-11-26 NOTE — Interval H&P Note (Signed)
History and Physical Interval Note:  11/26/2021 1:59 PM  [name]  has presented today for surgery, with the diagnosis of combined forms age related cataract; right.  The various methods of treatment have been discussed with the patient and family. After consideration of risks, benefits and other options for treatment, the patient has consented to  Procedure(s) with comments: CATARACT EXTRACTION PHACO AND INTRAOCULAR LENS PLACEMENT (IOC) (Right) - CDE: as a surgical intervention.  The patient's history has been reviewed, patient examined, no change in status, stable for surgery.  I have reviewed the patient's chart and labs.  Questions were answered to the patient's satisfaction.     Baruch Goldmann

## 2021-11-27 ENCOUNTER — Ambulatory Visit (HOSPITAL_COMMUNITY): Payer: Medicare Other | Admitting: Physical Therapy

## 2021-11-27 DIAGNOSIS — I872 Venous insufficiency (chronic) (peripheral): Secondary | ICD-10-CM | POA: Diagnosis not present

## 2021-11-27 DIAGNOSIS — R601 Generalized edema: Secondary | ICD-10-CM

## 2021-11-27 DIAGNOSIS — L97228 Non-pressure chronic ulcer of left calf with other specified severity: Secondary | ICD-10-CM | POA: Diagnosis not present

## 2021-11-27 NOTE — Therapy (Signed)
[facility] [place], [place], [address] Phone: [phone]   Fax:  [phone]  Wound Care Therapy  Patient Details  Name: [name] MRN: [id number] Date of Birth: 1951/02/25 Referring Provider (PT): Stoney Bang PHYSICAL THERAPY DISCHARGE SUMMARY  Visits from Start of Care: 3  Current functional level related to goals / functional outcomes: All wounds healed    Remaining deficits: Pt has some induration    Education / Equipment: The importance of wearing compression daily   Patient agrees to discharge. Patient goals were met. Patient is being discharged due to meeting the stated rehab goals.   Encounter Date: 11/27/2021   PT End of Session - 11/27/21 1607     Visit Number 3    Number of Visits 8    Date for PT Re-Evaluation 12/15/21    Authorization Type UHC medicare;medicaid    Progress Note Due on Visit 3    PT Start Time 1530    PT Stop Time 1600    PT Time Calculation (min) 30 min    Activity Tolerance Patient tolerated treatment well    Behavior During Therapy WFL for tasks assessed/performed             Past Medical History:  Diagnosis Date   Arthritis    Asthma    Cancer ([place])    Rectal    Chronic renal disease, stage 3, moderately decreased glomerular filtration rate between 30-59 mL/min/1.73 square meter ([place]) 04/27/2014   Depression    Diabetes mellitus    years   GERD (gastroesophageal reflux disease)    Gout    History of recurrent UTIs    Hypertension    Iron deficiency 04/27/2014   Lung cancer ([place])    Mental retardation    stopped school at 9th grade    Numbness and tingling in hands    x several months    Rectal cancer ([place])    Seizures ([place])    more than 4 yrs since last seizure. UNknown etiology   Shortness of breath    with exertion   Vitamin B 12 deficiency 02/01/2015   Incidentally found without antibody testing.      Past Surgical History:  Procedure  Laterality Date   ABDOMINAL HYSTERECTOMY     ABDOMINAL PERINEAL BOWEL RESECTION N/A 07/26/2013   Procedure:  ABDOMINAL PERINEAL RESECTION;  Surgeon: Jamesetta So, MD;  [facility];  Service: General;  Laterality: N/A;   CATARACT EXTRACTION W/PHACO Left 11/12/2021   Procedure: CATARACT EXTRACTION PHACO AND INTRAOCULAR LENS PLACEMENT ([place]);  Surgeon: Baruch Goldmann, MD;  Location: [facility];  Service: Ophthalmology;  Laterality: Left;  CDE 6.24   COLON SURGERY  2015   bowel resection/w colostomy   COLONOSCOPY N/A 02/04/2013   Procedure: COLONOSCOPY;  Surgeon: Rogene Houston, MD;  [facility];  Service: Endoscopy;  Laterality: N/A;  225   COLONOSCOPY N/A 05/12/2014   Procedure: COLONOSCOPY;  Surgeon: Rogene Houston, MD;  [facility];  Service: Endoscopy;  Laterality: N/A;  1030   COLONOSCOPY WITH ESOPHAGOGASTRODUODENOSCOPY (EGD) N/A 01/14/2013   Procedure: COLONOSCOPY WITH ESOPHAGOGASTRODUODENOSCOPY (EGD);  Surgeon: Rogene Houston, MD;  [facility];  Service: Endoscopy;  Laterality: N/A;  250-moved to 315 Ann to notify pt   COLOSTOMY Left 07/26/2013   Procedure: COLOSTOMY;  Surgeon: Jamesetta So, MD;  [facility];  Service: General;  Laterality: Left;   EUS N/A 02/18/2013  Procedure: LOWER ENDOSCOPIC ULTRASOUND (EUS);  Surgeon: Milus Banister, MD;  Location[facility];  Service: Endoscopy;  Laterality: N/A;   FLEXIBLE SIGMOIDOSCOPY N/A 07/26/2013   Procedure: FLEXIBLE SIGMOIDOSCOPY;  Surgeon: Jamesetta So, MD;  [facility];  Service: General;  Laterality: N/A;   LYMPH NODE DISSECTION Left 10/26/2014   Procedure: LYMPH NODE DISSECTION;  Surgeon: [name], MD;  Location: [facility];  Service: Thoracic;  Laterality: Left;   MULTIPLE EXTRACTIONS WITH ALVEOLOPLASTY N/A 11/23/2012   Procedure: MULTIPLE EXTRACION 1, 2, 4, 5, 6, 7, 8, 9, 10, 11, 12, 13, 14, 17, 18, 20, 23, 24, 25, 26, 28, 29, 32 WITH ALVEOLOPLASTY, REMOVE BILATERAL TORI;   Surgeon: Gae Bon, DDS;  Location: [facility];  Service: Oral Surgery;  Laterality: N/A;   PORTACATH PLACEMENT Left 01/02/2015   Procedure: INSERTION PORT-A-CATH;  Surgeon: Aviva Signs, MD;  [facility];  Service: General;  Laterality: Left;   SALPINGOOPHORECTOMY Bilateral 07/26/2013   Procedure: SALPINGO OOPHORECTOMY;  Surgeon: Jamesetta So, MD;  [facility];  Service: General;  Laterality: Bilateral;   SEGMENTECOMY Left 10/26/2014   Procedure: LEFT LOWER LOBE SUPERIOR SEGMENTECTOMY;  Surgeon: [name], MD;  Location: [facility];  Service: Thoracic;  Laterality: Left;   SUPRACERVICAL ABDOMINAL HYSTERECTOMY N/A 07/26/2013   Procedure: HYSTERECTOMY SUPRACERVICAL ABDOMINAL ;  Surgeon: Jamesetta So, MD;  [facility];  Service: General;  Laterality: N/A;   VIDEO ASSISTED THORACOSCOPY Left 10/26/2014   Procedure: LEFT VIDEO ASSISTED THORACOSCOPY;  Surgeon: [name], MD;  Location: [facility];  Service: Thoracic;  Laterality: Left;    There were no vitals filed for this visit.               Wound Therapy - 11/27/21 0001     Subjective PT states no pain    Pain Scale 0-10    Pain Score 0-No pain    Wound Properties Date First Assessed: 11/15/21 Time First Assessed: 1610 Wound Type: Venous stasis ulcer Location: Leg Location Orientation: Left;Lower Wound Description (Comments): medial aspect .4x.3 cm Final Assessment Date: 11/27/21 Final Assessment Time: 1540   Wound Therapy - Clinical Statement Pt no longer has any wounds.  induration is down:  Rt LE at 30 cm was 43.6 now 42; 20 cm was 48.8 now 35; 10 cm: was 39.5 no2w 31.5; LT LE 30 cm was 51.9 now 46; 20 cm: was 39 now 38; 10 cm was 36 now 32.5.  Therapist gave care giver instructions on compression garments and explained that they needed to be worn every day.Care giver acknowledged this but states there are days that the pt will not allow the facility to don them and when that happens they can not force her  per regulation.    Factors Delaying/Impairing Wound Healing Altered sensation;Diabetes Mellitus;Polypharmacy;Multiple medical problems;Vascular compromise    Hydrotherapy Plan Patient/family education;Other (comment)    Wound Plan Discharge.               Pt LE cleansed and moisturized following removal of profore followed my measurement for edema.         PT Short Term Goals - 11/27/21 1604       PT SHORT TERM GOAL #1   Title PT wound to be healed    Time 2    Period Weeks    Status Achieved      PT SHORT TERM GOAL #2   Title Pt to have lost 2-3 cm from  measured areas to reduce risk of cellulitis.    Time 2    Period Weeks    Status Achieved               PT Long Term Goals - 11/27/21 1605       PT LONG TERM GOAL #1   Title Pt to have lost 4-5 cm from areas measured to decrease risk of wounds and cellulitis.    Time 4    Status Achieved      PT LONG TERM GOAL #2   Title PT to have acquired a juxtafit and staff to know how to don and doff and the importance of daily donning    Time 4    Period Weeks    Status Unable to assess    Target Date --   therapist gave facility information on garments and juxtafit.                  Plan - 11/27/21 1606     Clinical Impression Statement Pt to be discharged.    Personal Factors and Comorbidities Comorbidity 3+    Comorbidities obesity, colostomy which care givers states comes loose often, venous stasis, diabetes, limited ability to understand what lymphedema is and how to care for it.    Examination-Activity Limitations Dressing    Examination-Participation Restrictions Other    Stability/Clinical Decision Making Evolving/Moderate complexity    Rehab Potential Good    PT Frequency 2x / week    PT Duration 4 weeks    PT Treatment/Interventions Compression bandaging;Manual lymph drainage;Manual techniques    PT Next Visit Plan see above.             Patient will benefit from skilled  therapeutic intervention in order to improve the following deficits and impairments:  Decreased skin integrity, Obesity, Increased edema  Visit Diagnosis: Generalized edema  Venous stasis ulcer of left calf with other ulcer severity without varicose veins (HCC)     Problem List Patient Active Problem List   Diagnosis Date Noted   Swelling 07/01/2019   Pain due to onychomycosis of toenails of both feet 08/27/2018   Diabetic neuropathy ([place]) 08/27/2018   Vitamin B 12 deficiency 02/01/2015   Lung nodule 10/26/2014   Iron deficiency 04/27/2014   Chronic renal disease, stage 3, moderately decreased glomerular filtration rate between 30-59 mL/min/1.73 square meter ([place]) 04/27/2014   Incidental pulmonary nodule, greater than or equal to 75m 04/26/2014   Abdominal pain 04/26/2014   Insomnia 04/26/2014   Rectal cancer [place]asized to lung [place] 143/83/7793  Helicobacter pylori gastritis 02/08/2013   Elevated liver enzymes 12/02/2012   Anemia 08/29/2008   ASTHMA, WITH ACUTE EXACERBATION 07/01/2008   FLANK PAIN, LEFT 05/27/2008   SINUS TACHYCARDIA 03/02/2008   INTERTRIGO, CANDIDAL 10/19/2007   DIABETES MELLITUS, TYPE II, UNCONTROLLED 07/27/2007   LIVER FUNCTION TESTS, ABNORMAL 04/28/2007   UNSPECIFIED SLEEP DISTURBANCE 02/03/2007   DISEASE, PANCREAS NOS 11/20/2006   HYPERLIPIDEMIA 11/03/2006   GOUT NOS 11/03/2006   DEPRESSION 11/03/2006   RETARDATION, MENTAL NOS 11/03/2006   Essential hypertension 11/03/2006   ALLERGIC RHINITIS 11/03/2006   ASTHMA 11/03/2006   ARTHRITIS 11/03/2006   SEIZURE DISORDER 11/03/2006   MIGRAINES, HX OF 11/03/2006   CRayetta Humphrey PT CLT [phone] 11/27/2021, 4:08 PM  [place] [address]Phone: [phone]  Fax:  [phone] Name: LGeorgenia SalimMRN: [id number] of Birth: 409-09-1950

## 2021-11-29 ENCOUNTER — Ambulatory Visit (HOSPITAL_COMMUNITY): Payer: Medicare Other

## 2021-11-29 ENCOUNTER — Encounter (HOSPITAL_COMMUNITY): Payer: Self-pay | Admitting: Ophthalmology

## 2021-12-04 ENCOUNTER — Ambulatory Visit (HOSPITAL_COMMUNITY): Payer: Medicare Other | Admitting: Physical Therapy

## 2021-12-05 DIAGNOSIS — E119 Type 2 diabetes mellitus without complications: Secondary | ICD-10-CM | POA: Diagnosis not present

## 2021-12-06 ENCOUNTER — Ambulatory Visit (HOSPITAL_COMMUNITY): Payer: Medicare Other | Admitting: Physical Therapy

## 2021-12-06 DIAGNOSIS — S5011XA Contusion of right forearm, initial encounter: Secondary | ICD-10-CM | POA: Diagnosis not present

## 2021-12-11 ENCOUNTER — Ambulatory Visit (HOSPITAL_COMMUNITY): Payer: Medicare Other | Admitting: Physical Therapy

## 2021-12-11 DIAGNOSIS — M8589 Other specified disorders of bone density and structure, multiple sites: Secondary | ICD-10-CM | POA: Diagnosis not present

## 2021-12-11 DIAGNOSIS — M810 Age-related osteoporosis without current pathological fracture: Secondary | ICD-10-CM | POA: Diagnosis not present

## 2021-12-13 ENCOUNTER — Ambulatory Visit (HOSPITAL_COMMUNITY): Payer: Medicare Other | Admitting: Physical Therapy

## 2021-12-17 DIAGNOSIS — Z933 Colostomy status: Secondary | ICD-10-CM | POA: Diagnosis not present

## 2021-12-18 ENCOUNTER — Ambulatory Visit (HOSPITAL_COMMUNITY): Payer: Medicare Other | Admitting: Physical Therapy

## 2021-12-20 ENCOUNTER — Ambulatory Visit (HOSPITAL_COMMUNITY): Payer: Medicare Other | Admitting: Physical Therapy

## 2021-12-20 DIAGNOSIS — M10039 Idiopathic gout, unspecified wrist: Secondary | ICD-10-CM | POA: Diagnosis not present

## 2021-12-20 DIAGNOSIS — I872 Venous insufficiency (chronic) (peripheral): Secondary | ICD-10-CM | POA: Diagnosis not present

## 2021-12-20 DIAGNOSIS — N1831 Chronic kidney disease, stage 3a: Secondary | ICD-10-CM | POA: Diagnosis not present

## 2021-12-25 DIAGNOSIS — I872 Venous insufficiency (chronic) (peripheral): Secondary | ICD-10-CM | POA: Diagnosis not present

## 2021-12-25 DIAGNOSIS — M10039 Idiopathic gout, unspecified wrist: Secondary | ICD-10-CM | POA: Diagnosis not present

## 2021-12-25 DIAGNOSIS — N1831 Chronic kidney disease, stage 3a: Secondary | ICD-10-CM | POA: Diagnosis not present

## 2022-01-17 DIAGNOSIS — Z933 Colostomy status: Secondary | ICD-10-CM | POA: Diagnosis not present

## 2022-01-23 DIAGNOSIS — Z933 Colostomy status: Secondary | ICD-10-CM | POA: Diagnosis not present

## 2022-01-30 DIAGNOSIS — J441 Chronic obstructive pulmonary disease with (acute) exacerbation: Secondary | ICD-10-CM | POA: Diagnosis not present

## 2022-01-30 DIAGNOSIS — E119 Type 2 diabetes mellitus without complications: Secondary | ICD-10-CM | POA: Diagnosis not present

## 2022-02-12 DIAGNOSIS — R5381 Other malaise: Secondary | ICD-10-CM | POA: Diagnosis not present

## 2022-02-12 DIAGNOSIS — T148XXA Other injury of unspecified body region, initial encounter: Secondary | ICD-10-CM | POA: Diagnosis not present

## 2022-02-18 DIAGNOSIS — Z933 Colostomy status: Secondary | ICD-10-CM | POA: Diagnosis not present

## 2022-03-01 DIAGNOSIS — M6281 Muscle weakness (generalized): Secondary | ICD-10-CM | POA: Diagnosis not present

## 2022-03-04 DIAGNOSIS — Z7189 Other specified counseling: Secondary | ICD-10-CM | POA: Diagnosis not present

## 2022-03-04 DIAGNOSIS — J449 Chronic obstructive pulmonary disease, unspecified: Secondary | ICD-10-CM | POA: Diagnosis not present

## 2022-03-04 DIAGNOSIS — I10 Essential (primary) hypertension: Secondary | ICD-10-CM | POA: Diagnosis not present

## 2022-03-04 DIAGNOSIS — F32A Depression, unspecified: Secondary | ICD-10-CM | POA: Diagnosis not present

## 2022-03-04 DIAGNOSIS — M6281 Muscle weakness (generalized): Secondary | ICD-10-CM | POA: Diagnosis not present

## 2022-03-05 DIAGNOSIS — N183 Chronic kidney disease, stage 3 unspecified: Secondary | ICD-10-CM | POA: Diagnosis not present

## 2022-03-05 DIAGNOSIS — E782 Mixed hyperlipidemia: Secondary | ICD-10-CM | POA: Diagnosis not present

## 2022-03-05 DIAGNOSIS — K219 Gastro-esophageal reflux disease without esophagitis: Secondary | ICD-10-CM | POA: Diagnosis not present

## 2022-03-05 DIAGNOSIS — M6281 Muscle weakness (generalized): Secondary | ICD-10-CM | POA: Diagnosis not present

## 2022-03-06 DIAGNOSIS — Z933 Colostomy status: Secondary | ICD-10-CM | POA: Diagnosis not present

## 2022-03-06 DIAGNOSIS — E782 Mixed hyperlipidemia: Secondary | ICD-10-CM | POA: Diagnosis not present

## 2022-03-06 DIAGNOSIS — J449 Chronic obstructive pulmonary disease, unspecified: Secondary | ICD-10-CM | POA: Diagnosis not present

## 2022-03-06 DIAGNOSIS — N184 Chronic kidney disease, stage 4 (severe): Secondary | ICD-10-CM | POA: Diagnosis not present

## 2022-03-06 DIAGNOSIS — M6281 Muscle weakness (generalized): Secondary | ICD-10-CM | POA: Diagnosis not present

## 2022-03-07 DIAGNOSIS — Z933 Colostomy status: Secondary | ICD-10-CM | POA: Diagnosis not present

## 2022-03-07 DIAGNOSIS — Z85038 Personal history of other malignant neoplasm of large intestine: Secondary | ICD-10-CM | POA: Diagnosis not present

## 2022-03-07 DIAGNOSIS — F32A Depression, unspecified: Secondary | ICD-10-CM | POA: Diagnosis not present

## 2022-03-07 DIAGNOSIS — M6281 Muscle weakness (generalized): Secondary | ICD-10-CM | POA: Diagnosis not present

## 2022-03-07 DIAGNOSIS — E119 Type 2 diabetes mellitus without complications: Secondary | ICD-10-CM | POA: Diagnosis not present

## 2022-03-08 DIAGNOSIS — M6281 Muscle weakness (generalized): Secondary | ICD-10-CM | POA: Diagnosis not present

## 2022-03-11 DIAGNOSIS — M6281 Muscle weakness (generalized): Secondary | ICD-10-CM | POA: Diagnosis not present

## 2022-03-12 DIAGNOSIS — M6281 Muscle weakness (generalized): Secondary | ICD-10-CM | POA: Diagnosis not present

## 2022-03-13 DIAGNOSIS — M6281 Muscle weakness (generalized): Secondary | ICD-10-CM | POA: Diagnosis not present

## 2022-03-14 DIAGNOSIS — Z79899 Other long term (current) drug therapy: Secondary | ICD-10-CM | POA: Diagnosis not present

## 2022-03-14 DIAGNOSIS — M6281 Muscle weakness (generalized): Secondary | ICD-10-CM | POA: Diagnosis not present

## 2022-03-15 DIAGNOSIS — E1129 Type 2 diabetes mellitus with other diabetic kidney complication: Secondary | ICD-10-CM | POA: Diagnosis not present

## 2022-03-15 DIAGNOSIS — R809 Proteinuria, unspecified: Secondary | ICD-10-CM | POA: Diagnosis not present

## 2022-03-15 DIAGNOSIS — N170 Acute kidney failure with tubular necrosis: Secondary | ICD-10-CM | POA: Diagnosis not present

## 2022-03-15 DIAGNOSIS — E1122 Type 2 diabetes mellitus with diabetic chronic kidney disease: Secondary | ICD-10-CM | POA: Diagnosis not present

## 2022-03-15 DIAGNOSIS — I129 Hypertensive chronic kidney disease with stage 1 through stage 4 chronic kidney disease, or unspecified chronic kidney disease: Secondary | ICD-10-CM | POA: Diagnosis not present

## 2022-03-15 DIAGNOSIS — E211 Secondary hyperparathyroidism, not elsewhere classified: Secondary | ICD-10-CM | POA: Diagnosis not present

## 2022-03-15 DIAGNOSIS — D638 Anemia in other chronic diseases classified elsewhere: Secondary | ICD-10-CM | POA: Diagnosis not present

## 2022-03-15 DIAGNOSIS — N189 Chronic kidney disease, unspecified: Secondary | ICD-10-CM | POA: Diagnosis not present

## 2022-03-16 DIAGNOSIS — M6281 Muscle weakness (generalized): Secondary | ICD-10-CM | POA: Diagnosis not present

## 2022-03-18 DIAGNOSIS — M6281 Muscle weakness (generalized): Secondary | ICD-10-CM | POA: Diagnosis not present

## 2022-03-19 DIAGNOSIS — M6281 Muscle weakness (generalized): Secondary | ICD-10-CM | POA: Diagnosis not present

## 2022-03-20 ENCOUNTER — Inpatient Hospital Stay: Payer: Medicare Other

## 2022-03-20 DIAGNOSIS — M6281 Muscle weakness (generalized): Secondary | ICD-10-CM | POA: Diagnosis not present

## 2022-03-21 DIAGNOSIS — M6281 Muscle weakness (generalized): Secondary | ICD-10-CM | POA: Diagnosis not present

## 2022-03-21 DIAGNOSIS — R059 Cough, unspecified: Secondary | ICD-10-CM | POA: Diagnosis not present

## 2022-03-22 DIAGNOSIS — M6281 Muscle weakness (generalized): Secondary | ICD-10-CM | POA: Diagnosis not present

## 2022-03-25 ENCOUNTER — Inpatient Hospital Stay: Payer: 59 | Attending: Hematology

## 2022-03-25 DIAGNOSIS — Z9221 Personal history of antineoplastic chemotherapy: Secondary | ICD-10-CM | POA: Diagnosis not present

## 2022-03-25 DIAGNOSIS — D509 Iron deficiency anemia, unspecified: Secondary | ICD-10-CM

## 2022-03-25 DIAGNOSIS — Z85048 Personal history of other malignant neoplasm of rectum, rectosigmoid junction, and anus: Secondary | ICD-10-CM | POA: Insufficient documentation

## 2022-03-25 DIAGNOSIS — Z923 Personal history of irradiation: Secondary | ICD-10-CM | POA: Insufficient documentation

## 2022-03-25 DIAGNOSIS — N183 Chronic kidney disease, stage 3 unspecified: Secondary | ICD-10-CM | POA: Insufficient documentation

## 2022-03-25 DIAGNOSIS — E538 Deficiency of other specified B group vitamins: Secondary | ICD-10-CM | POA: Diagnosis not present

## 2022-03-25 DIAGNOSIS — C20 Malignant neoplasm of rectum: Secondary | ICD-10-CM

## 2022-03-25 DIAGNOSIS — D631 Anemia in chronic kidney disease: Secondary | ICD-10-CM | POA: Diagnosis not present

## 2022-03-25 DIAGNOSIS — Z79899 Other long term (current) drug therapy: Secondary | ICD-10-CM | POA: Diagnosis not present

## 2022-03-25 DIAGNOSIS — R748 Abnormal levels of other serum enzymes: Secondary | ICD-10-CM | POA: Insufficient documentation

## 2022-03-25 DIAGNOSIS — Z87891 Personal history of nicotine dependence: Secondary | ICD-10-CM | POA: Insufficient documentation

## 2022-03-25 LAB — CBC WITH DIFFERENTIAL/PLATELET
Abs Immature Granulocytes: 0.04 10*3/uL (ref 0.00–0.07)
Basophils Absolute: 0 10*3/uL (ref 0.0–0.1)
Basophils Relative: 0 %
Eosinophils Absolute: 0.2 10*3/uL (ref 0.0–0.5)
Eosinophils Relative: 4 %
HCT: 25.7 % — ABNORMAL LOW (ref 36.0–46.0)
Hemoglobin: 7.9 g/dL — ABNORMAL LOW (ref 12.0–15.0)
Immature Granulocytes: 1 %
Lymphocytes Relative: 12 %
Lymphs Abs: 0.6 10*3/uL — ABNORMAL LOW (ref 0.7–4.0)
MCH: 27.7 pg (ref 26.0–34.0)
MCHC: 30.7 g/dL (ref 30.0–36.0)
MCV: 90.2 fL (ref 80.0–100.0)
Monocytes Absolute: 0.4 10*3/uL (ref 0.1–1.0)
Monocytes Relative: 8 %
Neutro Abs: 3.6 10*3/uL (ref 1.7–7.7)
Neutrophils Relative %: 75 %
Platelets: 182 10*3/uL (ref 150–400)
RBC: 2.85 MIL/uL — ABNORMAL LOW (ref 3.87–5.11)
RDW: 16.4 % — ABNORMAL HIGH (ref 11.5–15.5)
WBC: 4.8 10*3/uL (ref 4.0–10.5)
nRBC: 0 % (ref 0.0–0.2)

## 2022-03-25 LAB — COMPREHENSIVE METABOLIC PANEL
ALT: 13 U/L (ref 0–44)
AST: 19 U/L (ref 15–41)
Albumin: 3 g/dL — ABNORMAL LOW (ref 3.5–5.0)
Alkaline Phosphatase: 294 U/L — ABNORMAL HIGH (ref 38–126)
Anion gap: 11 (ref 5–15)
BUN: 48 mg/dL — ABNORMAL HIGH (ref 8–23)
CO2: 29 mmol/L (ref 22–32)
Calcium: 8.4 mg/dL — ABNORMAL LOW (ref 8.9–10.3)
Chloride: 92 mmol/L — ABNORMAL LOW (ref 98–111)
Creatinine, Ser: 1.96 mg/dL — ABNORMAL HIGH (ref 0.44–1.00)
GFR, Estimated: 27 mL/min — ABNORMAL LOW (ref >60)
Glucose, Bld: 251 mg/dL — ABNORMAL HIGH (ref 70–99)
Potassium: 3.6 mmol/L (ref 3.5–5.1)
Sodium: 132 mmol/L — ABNORMAL LOW (ref 135–145)
Total Bilirubin: 0.3 mg/dL (ref 0.3–1.2)
Total Protein: 6 g/dL — ABNORMAL LOW (ref 6.5–8.1)

## 2022-03-25 LAB — FERRITIN: Ferritin: 153 ng/mL (ref 11–307)

## 2022-03-25 LAB — IRON AND TIBC
Iron: 75 ug/dL (ref 28–170)
Saturation Ratios: 36 % — ABNORMAL HIGH (ref 10.4–31.8)
TIBC: 208 ug/dL — ABNORMAL LOW (ref 250–450)
UIBC: 133 ug/dL

## 2022-03-25 LAB — VITAMIN B12: Vitamin B-12: 1461 pg/mL — ABNORMAL HIGH (ref 180–914)

## 2022-03-26 LAB — CEA: CEA: 7.5 ng/mL — ABNORMAL HIGH (ref 0.0–4.7)

## 2022-03-27 ENCOUNTER — Ambulatory Visit: Payer: Medicare Other | Admitting: Hematology

## 2022-03-28 ENCOUNTER — Encounter (HOSPITAL_COMMUNITY): Payer: Self-pay | Admitting: Internal Medicine

## 2022-04-01 DIAGNOSIS — E119 Type 2 diabetes mellitus without complications: Secondary | ICD-10-CM | POA: Diagnosis not present

## 2022-04-01 DIAGNOSIS — J441 Chronic obstructive pulmonary disease with (acute) exacerbation: Secondary | ICD-10-CM | POA: Diagnosis not present

## 2022-04-04 ENCOUNTER — Inpatient Hospital Stay (HOSPITAL_BASED_OUTPATIENT_CLINIC_OR_DEPARTMENT_OTHER): Payer: 59 | Admitting: Hematology

## 2022-04-04 VITALS — BP 116/51 | HR 58 | Temp 97.7°F | Resp 16

## 2022-04-04 DIAGNOSIS — Z85048 Personal history of other malignant neoplasm of rectum, rectosigmoid junction, and anus: Secondary | ICD-10-CM | POA: Diagnosis not present

## 2022-04-04 DIAGNOSIS — N183 Chronic kidney disease, stage 3 unspecified: Secondary | ICD-10-CM | POA: Diagnosis not present

## 2022-04-04 DIAGNOSIS — D509 Iron deficiency anemia, unspecified: Secondary | ICD-10-CM

## 2022-04-04 DIAGNOSIS — E538 Deficiency of other specified B group vitamins: Secondary | ICD-10-CM | POA: Diagnosis not present

## 2022-04-04 DIAGNOSIS — Z923 Personal history of irradiation: Secondary | ICD-10-CM | POA: Diagnosis not present

## 2022-04-04 DIAGNOSIS — Z87891 Personal history of nicotine dependence: Secondary | ICD-10-CM | POA: Diagnosis not present

## 2022-04-04 DIAGNOSIS — Z9221 Personal history of antineoplastic chemotherapy: Secondary | ICD-10-CM | POA: Diagnosis not present

## 2022-04-04 DIAGNOSIS — R748 Abnormal levels of other serum enzymes: Secondary | ICD-10-CM | POA: Diagnosis not present

## 2022-04-04 DIAGNOSIS — D631 Anemia in chronic kidney disease: Secondary | ICD-10-CM | POA: Diagnosis not present

## 2022-04-04 NOTE — Patient Instructions (Addendum)
[facility]  Discharge Instructions  You were seen and examined today by Dr. Ellin Saba.  Your Hemoglobin has been steadily dropping. Dr. Ellin Saba will give you one dose of iron and start you on a red blood cell booster once every 3 weeks.  Follow-up as scheduled.  Thank you for choosing [facility] to provide your oncology and hematology care.   To afford each patient quality time with our provider, please arrive at least 15 minutes before your scheduled appointment time. You may need to reschedule your appointment if you arrive late (10 or more minutes). Arriving late affects you and other patients whose appointments are after yours.  Also, if you miss three or more appointments without notifying the office, you may be dismissed from the clinic at the provider's discretion.    Again, thank you for choosing [facility].  Our hope is that these requests will decrease the amount of time that you wait before being seen by our physicians.   If you have a lab appointment with the Cancer Center please come in thru the Main Entrance and check in at the main information desk.           _____________________________________________________________  Should you have questions after your visit to [facility], please contact our office at [phone] and follow the prompts.  Our office hours are 8:00 a.m. to 4:30 p.m. Monday - Thursday and 8:00 a.m. to 2:30 p.m. Friday.  Please note that voicemails left after 4:00 p.m. may not be returned until the following business day.  We are closed weekends and all major holidays.  You do have access to a nurse 24-7, just call the main number to the clinic [phone] and do not press any options, hold on the line and a nurse will answer the phone.    For prescription refill requests, have your pharmacy contact our office and allow 72 hours.    Masks are optional in the cancer  centers. If you would like for your care team to wear a mask while they are taking care of you, please let them know. You may have one support person who is at least 71 years old accompany you for your appointments.

## 2022-04-04 NOTE — Progress Notes (Signed)
[facility] [address], [place] [address]   CLINIC:  Medical Oncology/Hematology  PCP:  Toma Deiters, MD [address] / [place] [address] [phone]   REASON FOR VISIT:  Follow-up for stage IV rectal cancer  PRIOR THERAPY:  1. Concurrent chemoradiation with Xeloda from 03/25/2013 to 05/05/2013. 2. Abdominoperineal resection and TAH & BSO on 07/26/2013. 3. LLL lung resection in August 2016. 4. FOLFOX and Avastin x 12 cycles from 01/04/2015 to 06/27/2015.  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   03/25/2013 - 05/05/2013 Chemotherapy   Xeloda 1500 mg BID 5 days/week with radiation therapy.   07/26/2013 Definitive Surgery   Dr. Lovell Sheehan- Flexible sigmoidoscopy, abdominoperineal resection, total abdominal hysterectomy with bilateral salpingo-oophorectomy   08/30/2013 - 03/01/2014 Chemotherapy   Xeloda 1000 mg BID 14 days on and 7 days off x 6 months   03/08/2014 Imaging   CT CAP- Bilateral pulmonary nodules, including an 8 mm left lower lobe nodule. Metastatic disease is a concern. The left lower lobe nodule has progressed since 03/05/2013, when it measured 5 mm.   05/26/2014 Imaging   CT Chest- Continued enlargement of the superior segment left lower lobe nodule. Malignancy is likely.   In contrast, the right lower lobe nodule is probably benign.   08/17/2014 Imaging   CT- Superior segment left lower lobe pulmonary nodule measures stable since the most recent comparison study, but is again noted to have increased in size when comparing to older studies. Continued close attention will be required as neoplasm remains a con   10/26/2014 Surgery   thoracoscopic left lower lobe superior  segmentectomy. Pathology with metastatic adenocarcinoma c/w colonic primary   01/04/2015 - 06/27/2015 Chemotherapy   FOLFOX+Avastin (Avastin started on 01/18/2015).   01/09/2015 Imaging   CT CAP- Interval wedge resection of metastasis within the left lower lobe. No evidence of new metastatic disease within the chest, abdomen or pelvis.   02/15/2015 Treatment Plan Change   Treatment deferred due to renal function change (patient poor historian) with N/V.   02/22/2015 Treatment Plan Change   Added Aranesp at renal dosing to supportive therpay plan.   03/08/2015 Treatment Plan Change   Defer treatment x 1 week   04/12/2015 Adverse Reaction   Patient reported mouth sores.  None on exam.     04/12/2015 Treatment Plan Change   5FU bolus is discontinued for "mouth sores"   04/12/2015 Adverse Reaction   Increasing Creatinine Cl.  CrCl calculated to be 30.7.     04/12/2015 Treatment Plan Change   Oxaliplatin dose reduced by 20% to 65 mg/m2 based upon creatinine clearance.   05/24/2015 Treatment Plan Change   5FU CI decreased by 15%.   08/29/2015 Imaging   CT CAP- Status post APR with left lower quadrant colostomy, and some stable presacral scarring. No definite evidence of metastatic disease in the CAP.  There is a new 3 mm nodule in the right lower lobe which is highly nonspecific. Attention on follow-up.   01/03/2016 Imaging   CT chest- No definite findings to suggest metastatic disease to the lungs. Previously noted 3 mm right lower lobe pulmonary nodule stable, favored to be benign.   08/22/2016 Imaging   CT CAP-1.  Abdominoperineal resection with stable scarring and left lower quadrant colostomy. 2. 4 mm right lower lobe nodule, stable. 3. 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     CANCER STAGING:  Cancer Staging  Rectal cancer metastasized to lung [facility]) Staging form: Colon and Rectum, AJCC 7th Edition - Clinical stage from 02/04/2013: Stage IIA  (T3, N0, M0) - Signed by Ellouise Newer, PA-C on 01/09/2015 - Clinical stage from 10/26/2014: Stage IVA (T3, N0, M1a) - Signed by Ellouise Newer, PA-C on 01/09/2015   INTERVAL HISTORY:  [name], a 72 y.o. female, seen for follow-up of rectal cancer, iron deficiency anemia from CKD.  Denies any bleeding per rectum or melena.  Reports dyspnea on exertion.  She is currently at [facility] facility.  Energy levels are 50%.  She is on iron tablet twice daily and B12 tablet daily.  REVIEW OF SYSTEMS:  Review of Systems  Constitutional:  Negative for appetite change and fatigue.  Respiratory:  Positive for shortness of breath.   Genitourinary:  Negative for difficulty urinating.   All other systems reviewed and are negative.   PAST MEDICAL/SURGICAL HISTORY:  Past Medical History:  Diagnosis Date   Arthritis    Asthma    Cancer (HCC)    Rectal    Chronic renal disease, stage 3, moderately decreased glomerular filtration rate between 30-59 mL/min/1.73 square meter (HCC) 04/27/2014   Depression    Diabetes mellitus    years   GERD (gastroesophageal reflux disease)    Gout    History of recurrent UTIs    Hypertension    Iron deficiency 04/27/2014   Lung cancer (HCC)    Mental retardation    stopped school at 9th grade    Numbness and tingling in hands    x several months    Rectal cancer (HCC)    Seizures (HCC)    more than 4 yrs since last seizure. UNknown etiology   Shortness of breath    with exertion   Vitamin B 12 deficiency 02/01/2015   Incidentally found without antibody testing.     Past Surgical History:  Procedure Laterality Date   ABDOMINAL HYSTERECTOMY     ABDOMINAL PERINEAL BOWEL RESECTION N/A 07/26/2013   Procedure:  ABDOMINAL PERINEAL RESECTION;  Surgeon: Dalia Heading, MD;  Location: [facility];  Service: General;  Laterality: N/A;   CATARACT EXTRACTION W/PHACO Left 11/12/2021   Procedure: CATARACT EXTRACTION PHACO AND INTRAOCULAR LENS PLACEMENT  (IOC);  Surgeon: Fabio Pierce, MD;  Location: [facility];  Service: Ophthalmology;  Laterality: Left;  CDE 6.24   CATARACT EXTRACTION W/PHACO Right 11/26/2021   Procedure: CATARACT EXTRACTION PHACO AND INTRAOCULAR LENS PLACEMENT (IOC);  Surgeon: Fabio Pierce, MD;  Location: [facility];  Service: Ophthalmology;  Laterality: Right;  CDE:8.67   COLON SURGERY  2015   bowel resection/w colostomy   COLONOSCOPY N/A 02/04/2013   Procedure: COLONOSCOPY;  Surgeon: Malissa Hippo, MD;  Location: [facility];  Service: Endoscopy;  Laterality: N/A;  225   COLONOSCOPY N/A 05/12/2014   Procedure: COLONOSCOPY;  Surgeon: Malissa Hippo, MD;  Location: [facility];  Service: Endoscopy;  Laterality: N/A;  1030   COLONOSCOPY WITH ESOPHAGOGASTRODUODENOSCOPY (EGD) N/A 01/14/2013   Procedure: COLONOSCOPY WITH ESOPHAGOGASTRODUODENOSCOPY (EGD);  Surgeon: Malissa Hippo, MD;  Location: [facility];  Service: Endoscopy;  Laterality: N/A;  250-moved to 315 Ann to notify pt   COLOSTOMY Left 07/26/2013   Procedure: COLOSTOMY;  Surgeon: Loraine Leriche  Val Riles, MD;  Location: [facility];  Service: General;  Laterality: Left;   EUS N/A 02/18/2013   Procedure: LOWER ENDOSCOPIC ULTRASOUND (EUS);  Surgeon: Rachael Fee, MD;  Location: [facility];  Service: Endoscopy;  Laterality: N/A;   FLEXIBLE SIGMOIDOSCOPY N/A 07/26/2013   Procedure: FLEXIBLE SIGMOIDOSCOPY;  Surgeon: Dalia Heading, MD;  Location: [facility];  Service: General;  Laterality: N/A;   LYMPH NODE DISSECTION Left 10/26/2014   Procedure: LYMPH NODE DISSECTION;  Surgeon: Loreli Slot, MD;  Location: [facility];  Service: Thoracic;  Laterality: Left;   MULTIPLE EXTRACTIONS WITH ALVEOLOPLASTY N/A 11/23/2012   Procedure: MULTIPLE EXTRACION 1, 2, 4, 5, 6, 7, 8, 9, 10, 11, 12, 13, 14, 17, 18, 20, 23, 24, 25, 26, 28, 29, 32 WITH ALVEOLOPLASTY, REMOVE BILATERAL TORI;  Surgeon: Georgia Lopes, DDS;  Location: [facility];  Service: Oral Surgery;  Laterality: N/A;   PORTACATH PLACEMENT Left  01/02/2015   Procedure: INSERTION PORT-A-CATH;  Surgeon: Franky Macho, MD;  Location: [facility];  Service: General;  Laterality: Left;   SALPINGOOPHORECTOMY Bilateral 07/26/2013   Procedure: SALPINGO OOPHORECTOMY;  Surgeon: Dalia Heading, MD;  Location: [facility];  Service: General;  Laterality: Bilateral;   SEGMENTECOMY Left 10/26/2014   Procedure: LEFT LOWER LOBE SUPERIOR SEGMENTECTOMY;  Surgeon: Loreli Slot, MD;  Location: [facility] OR;  Service: Thoracic;  Laterality: Left;   SUPRACERVICAL ABDOMINAL HYSTERECTOMY N/A 07/26/2013   Procedure: HYSTERECTOMY SUPRACERVICAL ABDOMINAL ;  Surgeon: Dalia Heading, MD;  Location: [facility];  Service: General;  Laterality: N/A;   VIDEO ASSISTED THORACOSCOPY Left 10/26/2014   Procedure: LEFT VIDEO ASSISTED THORACOSCOPY;  Surgeon: Loreli Slot, MD;  Location: [facility] OR;  Service: Thoracic;  Laterality: Left;    SOCIAL HISTORY:  Social History   Socioeconomic History   Marital status: Single    Spouse name: Not on file   Number of children: Not on file   Years of education: Not on file   Highest education level: Not on file  Occupational History   Not on file  Tobacco Use   Smoking status: Former    Types: Cigarettes    Quit date: 07/16/2008    Years since quitting: 13.7   Smokeless tobacco: Never   Tobacco comments:    unknown when stopped smoking      long time  Substance and Sexual Activity   Alcohol use: No   Drug use: No   Sexual activity: Not on file  Other Topics Concern   Not on file  Social History Narrative   Not on file   Social Determinants of Health   Financial Resource Strain: Not on file  Food Insecurity: Not on file  Transportation Needs: Not on file  Physical Activity: Not on file  Stress: Not on file  Social Connections: Not on file  Intimate Partner Violence: Not on file    FAMILY HISTORY:  Family History  Family history unknown: Yes    CURRENT MEDICATIONS:  Current Outpatient Medications  Medication Sig  Dispense Refill   ACCU-CHEK AVIVA PLUS test strip      allopurinol (ZYLOPRIM) 300 MG tablet Take 300 mg by mouth daily.     amLODipine (NORVASC) 5 MG tablet Take 5 mg by mouth daily.     aspirin EC 81 MG tablet Take 81 mg by mouth daily.     calcium carbonate (TUMS) 500 MG chewable tablet Chew 2 tablets (400 mg of elemental calcium total) by mouth 3 (three) times daily.  180 tablet 2   calcium-vitamin D (OSCAL WITH D) 500-200 MG-UNIT tablet Take 2 tablets by mouth daily with breakfast.     colchicine 0.6 MG tablet Take 0.6 mg by mouth 2 (two) times daily.     Cyanocobalamin 1000 MCG CAPS Take 1 capsule by mouth daily. 30 capsule 3   docusate sodium (COLACE) 100 MG capsule Take 100 mg by mouth 2 (two) times daily.     donepezil (ARICEPT) 5 MG tablet Take 10 mg by mouth daily.     doxycycline (VIBRA-TABS) 100 MG tablet Take 1 tablet (100 mg total) by mouth 2 (two) times daily. 20 tablet 0   escitalopram (LEXAPRO) 10 MG tablet Take 10 mg by mouth daily.     ferrous sulfate 325 (65 FE) MG tablet Take by mouth.     fish oil-omega-3 fatty acids 1000 MG capsule Take 1 g by mouth 2 (two) times daily.     Fluticasone-Salmeterol (ADVAIR) 250-50 MCG/DOSE AEPB Inhale 1 puff into the lungs every 12 (twelve) hours.     furosemide (LASIX) 40 MG tablet Take 40 mg by mouth 2 (two) times daily.     ipratropium-albuterol (DUONEB) 0.5-2.5 (3) MG/3ML SOLN Take 3 mLs by nebulization every 2 (two) hours as needed. Congestion and wheezing     Lancets Misc. (UNISTIK 3 COMFORT) MISC      LANTUS SOLOSTAR 100 UNIT/ML Solostar Pen SMARTSIG:12 Unit(s) SUB-Q Every Night     loratadine (CLARITIN) 10 MG tablet Take by mouth.     metFORMIN (GLUCOPHAGE) 1000 MG tablet Take by mouth.     metoprolol (TOPROL-XL) 200 MG 24 hr tablet Take 200 mg by mouth every evening.      montelukast (SINGULAIR) 10 MG tablet Take 10 mg by mouth at bedtime.     NOVOFINE AUTOCOVER 30G X 8 MM MISC USE ONCE DAILY WITHOINSULIN PEN(S).     nystatin  (MYCOSTATIN/NYSTOP) powder SMARTSIG:1 Application Topical 2-3 Times Daily     OS-CAL CALCIUM + D3 500-200 MG-UNIT TABS TAKE 2 TABLETS BY MOUTH DAILY WITH BREAKFAST. 60 tablet 0   pantoprazole (PROTONIX) 40 MG tablet Take 40 mg by mouth every morning.      phenytoin (DILANTIN) 100 MG ER capsule Take 100 mg by mouth 2 (two) times daily.     pravastatin (PRAVACHOL) 40 MG tablet Take 40 mg by mouth every evening.      PROAIR HFA 108 (90 Base) MCG/ACT inhaler Inhale 1 puff into the lungs every 4 (four) hours as needed.     traZODone (DESYREL) 50 MG tablet Take 50 mg by mouth at bedtime as needed.     triamcinolone cream (KENALOG) 0.1 %      vitamin B-12 (CYANOCOBALAMIN) 1000 MCG tablet TAKE (1) TABLET BY MOUTH ONCE DAILY. 30 tablet 6   No current facility-administered medications for this visit.    ALLERGIES:  No Known Allergies  PHYSICAL EXAM:  Performance status (ECOG): 2 - Symptomatic, <50% confined to bed  Vitals:   04/04/22 1552  BP: (!) 116/51  Pulse: (!) 58  Resp: 16  Temp: 97.7 F (36.5 C)  SpO2: 100%   Wt Readings from Last 3 Encounters:  11/26/21 233 lb 11 oz (106 kg)  11/23/21 233 lb 11 oz (106 kg)  11/12/21 233 lb 11 oz (106 kg)   Physical Exam Vitals reviewed.  Constitutional:      Appearance: Normal appearance. She is obese.     Comments: In wheelchair  Cardiovascular:     Rate  and Rhythm: Normal rate and regular rhythm.     Pulses: Normal pulses.     Heart sounds: Normal heart sounds.  Pulmonary:     Effort: Pulmonary effort is normal.     Breath sounds: Normal breath sounds.  Neurological:     General: No focal deficit present.     Mental Status: She is alert and oriented to person, place, and time.  Psychiatric:        Mood and Affect: Mood normal.        Behavior: Behavior normal.      LABORATORY DATA:  I have reviewed the labs as listed.     Latest Ref Rng & Units 03/25/2022   10:00 AM 11/23/2021    9:13 AM 07/17/2021    9:25 AM  CBC  WBC 4.0 -  10.5 K/uL 4.8  4.7  4.7   Hemoglobin 12.0 - 15.0 g/dL 7.9  9.5  38.7   Hematocrit 36.0 - 46.0 % 25.7  29.8  32.2   Platelets 150 - 400 K/uL 182  210  170       Latest Ref Rng & Units 03/25/2022   10:00 AM 11/23/2021    9:13 AM 07/17/2021    9:25 AM  CMP  Glucose 70 - 99 mg/dL 494  170  863   BUN 8 - 23 mg/dL 48  59  67   Creatinine 0.44 - 1.00 mg/dL 3.45  4.44  9.98   Sodium 135 - 145 mmol/L 132  131  135   Potassium 3.5 - 5.1 mmol/L 3.6  4.4  4.3   Chloride 98 - 111 mmol/L 92  94  98   CO2 22 - 32 mmol/L 29  26  27    Calcium 8.9 - 10.3 mg/dL 8.4  8.9  9.1   Total Protein 6.5 - 8.1 g/dL 6.0   7.8   Total Bilirubin 0.3 - 1.2 mg/dL 0.3   0.3   Alkaline Phos 38 - 126 U/L 294   500   AST 15 - 41 U/L 19   19   ALT 0 - 44 U/L 13   15     DIAGNOSTIC IMAGING:  I have independently reviewed the scans and discussed with the patient. No results found.   ASSESSMENT:  1.  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 -Left lower lobe lung nodule in August 2016, status post resection confirming metastatic adenocarcinoma. -FOLFOX and Avastin from October 2016 through 06/27/2015. -CEA on 04/29/2019 was 5.6. -CT scan from April 28, 2019 with no evidence of active malignancy. -CTAP on December 30, 2019 showed stable soft tissue swelling in the pelvis from prior treatment.  Diffuse bony sclerosis also stable.  Lobular hepatic contour is consistent with liver disease.  Marked cortical scarring of the left kidney as before.   2.  Vitamin B12 deficiency:  -She had a history of B12 injections monthly which were continued until March 2021.   3.  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   PLAN:  1.  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  Right lung nodularity is thought to be benign. - Elevated alkaline phosphatase likely from Paget's disease. - Labs today shows otherwise normal LFTs.  CEA  is 7.5 and is slightly increased from previous value of 6.2 in May 2023 and 6.4 in October 2022. - Will consider imaging around May of this year.   2.  Vitamin B12 deficiency: -Continue B12 supplements.  B12 is normal.   3.  Normocytic anemia: -Combination anemia from CKD and relative iron deficiency. - Hemoglobin is down to 7.9, downtrending.  Ferritin is 153.  Percent saturation is 36. - Recommend Feraheme 510 mg x 1 and Aranesp 200 mcg every 3 weeks. - Reevaluate in 9 weeks with CBC, ferritin and iron panel.   Orders placed this encounter:  Orders Placed This Encounter  Procedures   CBC with Differential   Ferritin   Iron and TIBC ([facility] ONLY)   CBC      Doreatha Massed, MD [facility] [phone]

## 2022-04-08 DIAGNOSIS — Z79899 Other long term (current) drug therapy: Secondary | ICD-10-CM | POA: Diagnosis not present

## 2022-04-08 DIAGNOSIS — D649 Anemia, unspecified: Secondary | ICD-10-CM | POA: Diagnosis not present

## 2022-04-08 DIAGNOSIS — Z5181 Encounter for therapeutic drug level monitoring: Secondary | ICD-10-CM | POA: Diagnosis not present

## 2022-04-09 ENCOUNTER — Inpatient Hospital Stay: Payer: 59

## 2022-04-09 VITALS — BP 120/50 | HR 54 | Temp 96.2°F | Resp 20

## 2022-04-09 DIAGNOSIS — Z9221 Personal history of antineoplastic chemotherapy: Secondary | ICD-10-CM | POA: Diagnosis not present

## 2022-04-09 DIAGNOSIS — E538 Deficiency of other specified B group vitamins: Secondary | ICD-10-CM | POA: Diagnosis not present

## 2022-04-09 DIAGNOSIS — R748 Abnormal levels of other serum enzymes: Secondary | ICD-10-CM | POA: Diagnosis not present

## 2022-04-09 DIAGNOSIS — Z85048 Personal history of other malignant neoplasm of rectum, rectosigmoid junction, and anus: Secondary | ICD-10-CM | POA: Diagnosis not present

## 2022-04-09 DIAGNOSIS — D509 Iron deficiency anemia, unspecified: Secondary | ICD-10-CM

## 2022-04-09 DIAGNOSIS — N183 Chronic kidney disease, stage 3 unspecified: Secondary | ICD-10-CM | POA: Diagnosis not present

## 2022-04-09 DIAGNOSIS — Z79899 Other long term (current) drug therapy: Secondary | ICD-10-CM | POA: Diagnosis not present

## 2022-04-09 DIAGNOSIS — Z923 Personal history of irradiation: Secondary | ICD-10-CM | POA: Diagnosis not present

## 2022-04-09 DIAGNOSIS — D631 Anemia in chronic kidney disease: Secondary | ICD-10-CM | POA: Diagnosis not present

## 2022-04-09 DIAGNOSIS — Z87891 Personal history of nicotine dependence: Secondary | ICD-10-CM | POA: Diagnosis not present

## 2022-04-09 LAB — CBC
HCT: 26.5 % — ABNORMAL LOW (ref 36.0–46.0)
Hemoglobin: 8.4 g/dL — ABNORMAL LOW (ref 12.0–15.0)
MCH: 28.1 pg (ref 26.0–34.0)
MCHC: 31.7 g/dL (ref 30.0–36.0)
MCV: 88.6 fL (ref 80.0–100.0)
Platelets: 145 10*3/uL — ABNORMAL LOW (ref 150–400)
RBC: 2.99 MIL/uL — ABNORMAL LOW (ref 3.87–5.11)
RDW: 17 % — ABNORMAL HIGH (ref 11.5–15.5)
WBC: 4.2 10*3/uL (ref 4.0–10.5)
nRBC: 0 % (ref 0.0–0.2)

## 2022-04-09 MED ORDER — CYANOCOBALAMIN 1000 MCG/ML IJ SOLN: 1000.0000 ug | Freq: Once | INTRAMUSCULAR | Status: DC

## 2022-04-09 MED ORDER — DARBEPOETIN ALFA 200 MCG/0.4ML IJ SOSY
200.0000 ug | PREFILLED_SYRINGE | Freq: Once | INTRAMUSCULAR | Status: AC
  Administered 2022-04-09: 200 ug via SUBCUTANEOUS
  Filled 2022-04-09: qty 0.4

## 2022-04-09 NOTE — Progress Notes (Signed)
Patient tolerated Aranesp injection with no complaints voiced. Hemoglobin is 8.4.   Site clean and dry with no bruising or swelling noted.  No complaints of pain.  Discharged with vital signs stable and no signs or symptoms of distress noted.

## 2022-04-09 NOTE — Patient Instructions (Signed)
[facility]  Discharge Instructions: Thank you for choosing [facility] to provide your oncology and hematology care.  If you have a lab appointment with the Cancer Center, please come in thru the Main Entrance and check in at the main information desk.  Wear comfortable clothing and clothing appropriate for easy access to any Portacath or PICC line.   We strive to give you quality time with your provider. You may need to reschedule your appointment if you arrive late (15 or more minutes).  Arriving late affects you and other patients whose appointments are after yours.  Also, if you miss three or more appointments without notifying the office, you may be dismissed from the clinic at the provider's discretion.      For prescription refill requests, have your pharmacy contact our office and allow 72 hours for refills to be completed.    Darbepoetin Alfa Injection What is this medication? DARBEPOETIN ALFA (dar be POE e tin AL fa) treats low levels of red blood cells (anemia) caused by kidney disease or chemotherapy. It works by Systems analyst make more red blood cells, which reduces the need for blood transfusions. This medicine may be used for other purposes; ask your health care provider or pharmacist if you have questions. COMMON BRAND NAME(S): Aranesp What should I tell my care team before I take this medication? They need to know if you have any of these conditions: Blood clots Cancer Heart disease High blood pressure On dialysis Seizures Stroke An unusual or allergic reaction to darbepoetin, latex, other medications, foods, dyes, or preservatives Pregnant or trying to get pregnant Breast-feeding How should I use this medication? This medication is injected into a vein or under the skin. It is usually given by a care team in a hospital or clinic setting. It may also be given at home. If you get this medication at home, you will be taught how to  prepare and give it. Use exactly as directed. Take it as directed on the prescription label at the same time every day. Keep taking it unless your care team tells you to stop. It is important that you put your used needles and syringes in a special sharps container. Do not put them in a trash can. If you do not have a sharps container, call your pharmacist or care team to get one. A special MedGuide will be given to you by the pharmacist with each prescription and refill. Be sure to read this information carefully each time. Talk to your care team about the use of this medication in children. While this medication may be used in children as young as 1 month of age for selected conditions, precautions do apply. Overdosage: If you think you have taken too much of this medicine contact a poison control center or emergency room at once. NOTE: This medicine is only for you. Do not share this medicine with others. What if I miss a dose? If you miss a dose, take it as soon as you can. If it is almost time for your next dose, take only that dose. Do not take double or extra doses. What may interact with this medication? Epoetin alfa Methoxy polyethylene glycol-epoetin beta This list may not describe all possible interactions. Give your health care provider a list of all the medicines, herbs, non-prescription drugs, or dietary supplements you use. Also tell them if you smoke, drink alcohol, or use illegal drugs. Some items may interact with your medicine.  What should I watch for while using this medication? Visit your care team for regular checks on your progress. Check your blood pressure as directed. Know what your blood pressure should be and when to contact your care team. Your condition will be monitored carefully while you are receiving this medication. You may need blood work while taking this medication. What side effects may I notice from receiving this medication? Side effects that you should report  to your care team as soon as possible: Allergic reactions--skin rash, itching, hives, swelling of the face, lips, tongue, or throat Blood clot--pain, swelling, or warmth in the leg, shortness of breath, chest pain Heart attack--pain or tightness in the chest, shoulders, arms, or jaw, nausea, shortness of breath, cold or clammy skin, feeling faint or lightheaded Increase in blood pressure Rash, fever, and swollen lymph nodes Redness, blistering, peeling, or loosening of the skin, including inside the mouth Seizures Stroke--sudden numbness or weakness of the face, arm, or leg, trouble speaking, confusion, trouble walking, loss of balance or coordination, dizziness, severe headache, change in vision Side effects that usually do not require medical attention (report to your care team if they continue or are bothersome): Cough Stomach pain Swelling of the ankles, hands, or feet This list may not describe all possible side effects. Call your doctor for medical advice about side effects. You may report side effects to FDA at 1-800-FDA-1088. Where should I keep my medication? Keep out of the reach of children and pets. Store in a refrigerator. Do not freeze. Do not shake. Protect from light. Keep this medication in the original container until you are ready to take it. See product for storage information. Get rid of any unused medication after the expiration date. To get rid of medications that are no longer needed or have expired: Take the medication to a medication take-back program. Check with your pharmacy or law enforcement to find a location. If you cannot return the medication, ask your pharmacist or care team how to get rid of the medication safely. NOTE: This sheet is a summary. It may not cover all possible information. If you have questions about this medicine, talk to your doctor, pharmacist, or health care provider.  2023 Elsevier/Gold Standard (2021-06-12 00:00:00)   To help prevent  nausea and vomiting after your treatment, we encourage you to take your nausea medication as directed.  BELOW ARE SYMPTOMS THAT SHOULD BE REPORTED IMMEDIATELY: *FEVER GREATER THAN 100.4 F (38 C) OR HIGHER *CHILLS OR SWEATING *NAUSEA AND VOMITING THAT IS NOT CONTROLLED WITH YOUR NAUSEA MEDICATION *UNUSUAL SHORTNESS OF BREATH *UNUSUAL BRUISING OR BLEEDING *URINARY PROBLEMS (pain or burning when urinating, or frequent urination) *BOWEL PROBLEMS (unusual diarrhea, constipation, pain near the anus) TENDERNESS IN MOUTH AND THROAT WITH OR WITHOUT PRESENCE OF ULCERS (sore throat, sores in mouth, or a toothache) UNUSUAL RASH, SWELLING OR PAIN  UNUSUAL VAGINAL DISCHARGE OR ITCHING   Items with * indicate a potential emergency and should be followed up as soon as possible or go to the Emergency Department if any problems should occur.  Please show the CHEMOTHERAPY ALERT CARD or IMMUNOTHERAPY ALERT CARD at check-in to the Emergency Department and triage nurse.  Should you have questions after your visit or need to cancel or reschedule your appointment, please contact [facility] [phone]  and follow the prompts.  Office hours are 8:00 a.m. to 4:30 p.m. Monday - Friday. Please note that voicemails left after 4:00 p.m. may not be returned until the following  business day.  We are closed weekends and major holidays. You have access to a nurse at all times for urgent questions. Please call the main number to the clinic [phone] and follow the prompts.  For any non-urgent questions, you may also contact your provider using MyChart. We now offer e-Visits for anyone 10 and older to request care online for non-urgent symptoms. For details visit [email or web address].   Also download the MyChart app! Go to the app store, search "MyChart", open the app, select [facility], and log in with your MyChart username and password.

## 2022-04-12 ENCOUNTER — Inpatient Hospital Stay: Payer: 59

## 2022-04-12 VITALS — BP 131/53 | HR 54 | Temp 97.8°F | Resp 18

## 2022-04-12 DIAGNOSIS — Z85048 Personal history of other malignant neoplasm of rectum, rectosigmoid junction, and anus: Secondary | ICD-10-CM | POA: Diagnosis not present

## 2022-04-12 DIAGNOSIS — D631 Anemia in chronic kidney disease: Secondary | ICD-10-CM | POA: Diagnosis not present

## 2022-04-12 DIAGNOSIS — E538 Deficiency of other specified B group vitamins: Secondary | ICD-10-CM

## 2022-04-12 DIAGNOSIS — R748 Abnormal levels of other serum enzymes: Secondary | ICD-10-CM | POA: Diagnosis not present

## 2022-04-12 DIAGNOSIS — Z87891 Personal history of nicotine dependence: Secondary | ICD-10-CM | POA: Diagnosis not present

## 2022-04-12 DIAGNOSIS — Z923 Personal history of irradiation: Secondary | ICD-10-CM | POA: Diagnosis not present

## 2022-04-12 DIAGNOSIS — Z9221 Personal history of antineoplastic chemotherapy: Secondary | ICD-10-CM | POA: Diagnosis not present

## 2022-04-12 DIAGNOSIS — N183 Chronic kidney disease, stage 3 unspecified: Secondary | ICD-10-CM | POA: Diagnosis not present

## 2022-04-12 MED ORDER — SODIUM CHLORIDE 0.9 % IV SOLN
510.0000 mg | Freq: Once | INTRAVENOUS | Status: AC
  Administered 2022-04-12: 510 mg via INTRAVENOUS
  Filled 2022-04-12: qty 510

## 2022-04-12 MED ORDER — SODIUM CHLORIDE 0.9 % IV SOLN: INTRAVENOUS | Status: DC

## 2022-04-12 NOTE — Patient Instructions (Signed)
[facility]  Discharge Instructions: Thank you for choosing [facility] to provide your oncology and hematology care.  If you have a lab appointment with the [facility], please come in thru the Main Entrance and check in at the main information desk.  Wear comfortable clothing and clothing appropriate for easy access to any Portacath or PICC line.   We strive to give you quality time with your provider. You may need to reschedule your appointment if you arrive late (15 or more minutes).  Arriving late affects you and other patients whose appointments are after yours.  Also, if you miss three or more appointments without notifying the office, you may be dismissed from the clinic at the provider's discretion.      For prescription refill requests, have your pharmacy contact our office and allow 72 hours for refills to be completed.    Today you received the following F[place]me, return as scheduled.   To help prevent nausea and vomiting after your treatment, we encourage you to take your nausea medication as directed.  BELOW ARE SYMPTOMS THAT SHOULD BE REPORTED IMMEDIATELY: *FEVER GREATER THAN 100.4 F (38 C) OR HIGHER *CHILLS OR SWEATING *NAUSEA AND VOMITING THAT IS NOT CONTROLLED WITH YOUR NAUSEA MEDICATION *UNUSUAL SHORTNESS OF BREATH *UNUSUAL BRUISING OR BLEEDING *URINARY PROBLEMS (pain or burning when urinating, or frequent urination) *BOWEL PROBLEMS (unusual diarrhea, constipation, pain near the anus) TENDERNESS IN MOUTH AND THROAT WITH OR WITHOUT PRESENCE OF ULCERS (sore throat, sores in mouth, or a toothache) UNUSUAL RASH, SWELLING OR PAIN  UNUSUAL VAGINAL DISCHARGE OR ITCHING   Items with * indicate a potential emergency and should be followed up as soon as possible or go to the Emergency Department if any problems should occur.  Please show the CHEMOTHERAPY ALERT CARD or IMMUNOTHERAPY ALERT CARD at check-in to the Emergency Department and  triage nurse.  Should you have questions after your visit or need to cancel or reschedule your appointment, please contact [facility] [phone]  and follow the prompts.  Office hours are 8:00 a.m. to 4:30 p.m. Monday - Friday. Please note that voicemails left after 4:00 p.m. may not be returned until the following business day.  We are closed weekends and major holidays. You have access to a nurse at all times for urgent questions. Please call the main number to the clinic [phone] and follow the prompts.  For any non-urgent questions, you may also contact your provider using MyChart. We now offer e-Visits for anyone 72 and older to request care online for non-urgent symptoms. For details visit [email or web address].   Also download the MyChart app! Go to the app store, search "MyChart", open the app, select [facility], and log in with your MyChart username and password.

## 2022-04-12 NOTE — Progress Notes (Signed)
Patient tolerated iron infusion with no complaints voiced.  Peripheral IV site clean and dry with good blood return noted before and after infusion.  Band aid applied.  VSS with discharge and left in satisfactory condition with no s/s of distress noted.   

## 2022-04-17 DIAGNOSIS — J449 Chronic obstructive pulmonary disease, unspecified: Secondary | ICD-10-CM | POA: Diagnosis not present

## 2022-04-17 DIAGNOSIS — R3915 Urgency of urination: Secondary | ICD-10-CM | POA: Diagnosis not present

## 2022-04-17 DIAGNOSIS — I10 Essential (primary) hypertension: Secondary | ICD-10-CM | POA: Diagnosis not present

## 2022-04-17 DIAGNOSIS — E782 Mixed hyperlipidemia: Secondary | ICD-10-CM | POA: Diagnosis not present

## 2022-04-19 ENCOUNTER — Encounter (HOSPITAL_COMMUNITY): Payer: Self-pay | Admitting: Internal Medicine

## 2022-04-19 DIAGNOSIS — Z79899 Other long term (current) drug therapy: Secondary | ICD-10-CM | POA: Diagnosis not present

## 2022-04-19 DIAGNOSIS — N390 Urinary tract infection, site not specified: Secondary | ICD-10-CM | POA: Diagnosis not present

## 2022-04-24 ENCOUNTER — Inpatient Hospital Stay (HOSPITAL_COMMUNITY)
Admission: EM | Admit: 2022-04-24 | Discharge: 2022-04-30 | DRG: 871 | Disposition: A | Discharge status: Hospice | Payer: 59 | Source: Skilled Nursing Facility | Attending: Family Medicine | Admitting: Family Medicine

## 2022-04-24 ENCOUNTER — Inpatient Hospital Stay (HOSPITAL_COMMUNITY): Payer: Self-pay

## 2022-04-24 ENCOUNTER — Emergency Department (HOSPITAL_COMMUNITY): Payer: 59

## 2022-04-24 ENCOUNTER — Inpatient Hospital Stay (HOSPITAL_COMMUNITY): Payer: 59

## 2022-04-24 ENCOUNTER — Other Ambulatory Visit: Payer: Self-pay

## 2022-04-24 ENCOUNTER — Encounter (HOSPITAL_COMMUNITY): Payer: Self-pay | Admitting: Internal Medicine

## 2022-04-24 ENCOUNTER — Encounter (HOSPITAL_COMMUNITY): Payer: Self-pay | Admitting: Family Medicine

## 2022-04-24 DIAGNOSIS — E039 Hypothyroidism, unspecified: Secondary | ICD-10-CM | POA: Diagnosis present

## 2022-04-24 DIAGNOSIS — R680 Hypothermia, not associated with low environmental temperature: Secondary | ICD-10-CM | POA: Diagnosis present

## 2022-04-24 DIAGNOSIS — Z66 Do not resuscitate: Secondary | ICD-10-CM | POA: Diagnosis not present

## 2022-04-24 DIAGNOSIS — N170 Acute kidney failure with tubular necrosis: Secondary | ICD-10-CM | POA: Diagnosis present

## 2022-04-24 DIAGNOSIS — J90 Pleural effusion, not elsewhere classified: Secondary | ICD-10-CM | POA: Diagnosis present

## 2022-04-24 DIAGNOSIS — J9601 Acute respiratory failure with hypoxia: Secondary | ICD-10-CM | POA: Diagnosis not present

## 2022-04-24 DIAGNOSIS — E860 Dehydration: Secondary | ICD-10-CM | POA: Diagnosis not present

## 2022-04-24 DIAGNOSIS — T68XXXA Hypothermia, initial encounter: Secondary | ICD-10-CM | POA: Diagnosis not present

## 2022-04-24 DIAGNOSIS — E871 Hypo-osmolality and hyponatremia: Secondary | ICD-10-CM | POA: Diagnosis present

## 2022-04-24 DIAGNOSIS — Z961 Presence of intraocular lens: Secondary | ICD-10-CM | POA: Diagnosis present

## 2022-04-24 DIAGNOSIS — R001 Bradycardia, unspecified: Secondary | ICD-10-CM | POA: Diagnosis not present

## 2022-04-24 DIAGNOSIS — I9589 Other hypotension: Secondary | ICD-10-CM | POA: Diagnosis not present

## 2022-04-24 DIAGNOSIS — G9341 Metabolic encephalopathy: Secondary | ICD-10-CM | POA: Diagnosis not present

## 2022-04-24 DIAGNOSIS — I129 Hypertensive chronic kidney disease with stage 1 through stage 4 chronic kidney disease, or unspecified chronic kidney disease: Secondary | ICD-10-CM | POA: Diagnosis present

## 2022-04-24 DIAGNOSIS — Z7984 Long term (current) use of oral hypoglycemic drugs: Secondary | ICD-10-CM

## 2022-04-24 DIAGNOSIS — Z85118 Personal history of other malignant neoplasm of bronchus and lung: Secondary | ICD-10-CM

## 2022-04-24 DIAGNOSIS — Z794 Long term (current) use of insulin: Secondary | ICD-10-CM | POA: Diagnosis not present

## 2022-04-24 DIAGNOSIS — M25551 Pain in right hip: Secondary | ICD-10-CM | POA: Diagnosis not present

## 2022-04-24 DIAGNOSIS — I10 Essential (primary) hypertension: Secondary | ICD-10-CM | POA: Diagnosis present

## 2022-04-24 DIAGNOSIS — Z9079 Acquired absence of other genital organ(s): Secondary | ICD-10-CM

## 2022-04-24 DIAGNOSIS — Z4682 Encounter for fitting and adjustment of non-vascular catheter: Secondary | ICD-10-CM | POA: Diagnosis not present

## 2022-04-24 DIAGNOSIS — Z22322 Carrier or suspected carrier of Methicillin resistant Staphylococcus aureus: Secondary | ICD-10-CM

## 2022-04-24 DIAGNOSIS — Z87448 Personal history of other diseases of urinary system: Secondary | ICD-10-CM | POA: Diagnosis not present

## 2022-04-24 DIAGNOSIS — M533 Sacrococcygeal disorders, not elsewhere classified: Secondary | ICD-10-CM | POA: Diagnosis not present

## 2022-04-24 DIAGNOSIS — J9811 Atelectasis: Secondary | ICD-10-CM | POA: Diagnosis not present

## 2022-04-24 DIAGNOSIS — Z79899 Other long term (current) drug therapy: Secondary | ICD-10-CM

## 2022-04-24 DIAGNOSIS — W1830XA Fall on same level, unspecified, initial encounter: Secondary | ICD-10-CM | POA: Diagnosis present

## 2022-04-24 DIAGNOSIS — Z87440 Personal history of urinary (tract) infections: Secondary | ICD-10-CM

## 2022-04-24 DIAGNOSIS — D61818 Other pancytopenia: Secondary | ICD-10-CM | POA: Diagnosis not present

## 2022-04-24 DIAGNOSIS — Z7982 Long term (current) use of aspirin: Secondary | ICD-10-CM

## 2022-04-24 DIAGNOSIS — Z515 Encounter for palliative care: Secondary | ICD-10-CM | POA: Diagnosis not present

## 2022-04-24 DIAGNOSIS — N1831 Chronic kidney disease, stage 3a: Secondary | ICD-10-CM | POA: Diagnosis not present

## 2022-04-24 DIAGNOSIS — M75102 Unspecified rotator cuff tear or rupture of left shoulder, not specified as traumatic: Secondary | ICD-10-CM | POA: Diagnosis not present

## 2022-04-24 DIAGNOSIS — I499 Cardiac arrhythmia, unspecified: Secondary | ICD-10-CM | POA: Diagnosis not present

## 2022-04-24 DIAGNOSIS — F32A Depression, unspecified: Secondary | ICD-10-CM | POA: Diagnosis not present

## 2022-04-24 DIAGNOSIS — E878 Other disorders of electrolyte and fluid balance, not elsewhere classified: Secondary | ICD-10-CM | POA: Diagnosis present

## 2022-04-24 DIAGNOSIS — K219 Gastro-esophageal reflux disease without esophagitis: Secondary | ICD-10-CM | POA: Diagnosis present

## 2022-04-24 DIAGNOSIS — Z90710 Acquired absence of both cervix and uterus: Secondary | ICD-10-CM

## 2022-04-24 DIAGNOSIS — E1165 Type 2 diabetes mellitus with hyperglycemia: Secondary | ICD-10-CM | POA: Diagnosis not present

## 2022-04-24 DIAGNOSIS — R6520 Severe sepsis without septic shock: Secondary | ICD-10-CM

## 2022-04-24 DIAGNOSIS — Z90722 Acquired absence of ovaries, bilateral: Secondary | ICD-10-CM

## 2022-04-24 DIAGNOSIS — Z9221 Personal history of antineoplastic chemotherapy: Secondary | ICD-10-CM

## 2022-04-24 DIAGNOSIS — N390 Urinary tract infection, site not specified: Secondary | ICD-10-CM | POA: Diagnosis not present

## 2022-04-24 DIAGNOSIS — M109 Gout, unspecified: Secondary | ICD-10-CM | POA: Diagnosis present

## 2022-04-24 DIAGNOSIS — J811 Chronic pulmonary edema: Secondary | ICD-10-CM | POA: Diagnosis not present

## 2022-04-24 DIAGNOSIS — R6889 Other general symptoms and signs: Secondary | ICD-10-CM | POA: Diagnosis not present

## 2022-04-24 DIAGNOSIS — M1990 Unspecified osteoarthritis, unspecified site: Secondary | ICD-10-CM | POA: Diagnosis present

## 2022-04-24 DIAGNOSIS — R0609 Other forms of dyspnea: Secondary | ICD-10-CM | POA: Diagnosis not present

## 2022-04-24 DIAGNOSIS — C20 Malignant neoplasm of rectum: Secondary | ICD-10-CM | POA: Diagnosis not present

## 2022-04-24 DIAGNOSIS — R6521 Severe sepsis with septic shock: Secondary | ICD-10-CM | POA: Diagnosis present

## 2022-04-24 DIAGNOSIS — Z7189 Other specified counseling: Secondary | ICD-10-CM | POA: Diagnosis not present

## 2022-04-24 DIAGNOSIS — M1611 Unilateral primary osteoarthritis, right hip: Secondary | ICD-10-CM | POA: Diagnosis not present

## 2022-04-24 DIAGNOSIS — D638 Anemia in other chronic diseases classified elsewhere: Secondary | ICD-10-CM | POA: Diagnosis not present

## 2022-04-24 DIAGNOSIS — R0600 Dyspnea, unspecified: Secondary | ICD-10-CM | POA: Diagnosis not present

## 2022-04-24 DIAGNOSIS — J189 Pneumonia, unspecified organism: Secondary | ICD-10-CM | POA: Diagnosis present

## 2022-04-24 DIAGNOSIS — Z902 Acquired absence of lung [part of]: Secondary | ICD-10-CM

## 2022-04-24 DIAGNOSIS — E876 Hypokalemia: Secondary | ICD-10-CM | POA: Diagnosis present

## 2022-04-24 DIAGNOSIS — M19012 Primary osteoarthritis, left shoulder: Secondary | ICD-10-CM | POA: Diagnosis not present

## 2022-04-24 DIAGNOSIS — Z743 Need for continuous supervision: Secondary | ICD-10-CM | POA: Diagnosis not present

## 2022-04-24 DIAGNOSIS — Z933 Colostomy status: Secondary | ICD-10-CM

## 2022-04-24 DIAGNOSIS — Z87891 Personal history of nicotine dependence: Secondary | ICD-10-CM

## 2022-04-24 DIAGNOSIS — S0990XA Unspecified injury of head, initial encounter: Secondary | ICD-10-CM | POA: Diagnosis not present

## 2022-04-24 DIAGNOSIS — E1122 Type 2 diabetes mellitus with diabetic chronic kidney disease: Secondary | ICD-10-CM | POA: Diagnosis present

## 2022-04-24 DIAGNOSIS — R509 Fever, unspecified: Secondary | ICD-10-CM | POA: Diagnosis not present

## 2022-04-24 DIAGNOSIS — A419 Sepsis, unspecified organism: Principal | ICD-10-CM | POA: Diagnosis present

## 2022-04-24 DIAGNOSIS — Z923 Personal history of irradiation: Secondary | ICD-10-CM

## 2022-04-24 DIAGNOSIS — N183 Chronic kidney disease, stage 3 unspecified: Secondary | ICD-10-CM | POA: Diagnosis not present

## 2022-04-24 DIAGNOSIS — F79 Unspecified intellectual disabilities: Secondary | ICD-10-CM | POA: Diagnosis present

## 2022-04-24 DIAGNOSIS — G319 Degenerative disease of nervous system, unspecified: Secondary | ICD-10-CM | POA: Diagnosis not present

## 2022-04-24 DIAGNOSIS — E669 Obesity, unspecified: Secondary | ICD-10-CM | POA: Diagnosis present

## 2022-04-24 DIAGNOSIS — T420X5A Adverse effect of hydantoin derivatives, initial encounter: Secondary | ICD-10-CM | POA: Diagnosis present

## 2022-04-24 DIAGNOSIS — Z7951 Long term (current) use of inhaled steroids: Secondary | ICD-10-CM

## 2022-04-24 DIAGNOSIS — N179 Acute kidney failure, unspecified: Secondary | ICD-10-CM | POA: Diagnosis not present

## 2022-04-24 DIAGNOSIS — Z79891 Long term (current) use of opiate analgesic: Secondary | ICD-10-CM

## 2022-04-24 DIAGNOSIS — R918 Other nonspecific abnormal finding of lung field: Secondary | ICD-10-CM | POA: Diagnosis not present

## 2022-04-24 DIAGNOSIS — Z8249 Family history of ischemic heart disease and other diseases of the circulatory system: Secondary | ICD-10-CM

## 2022-04-24 DIAGNOSIS — R1310 Dysphagia, unspecified: Secondary | ICD-10-CM | POA: Diagnosis present

## 2022-04-24 DIAGNOSIS — S79911A Unspecified injury of right hip, initial encounter: Secondary | ICD-10-CM | POA: Diagnosis not present

## 2022-04-24 DIAGNOSIS — Z9049 Acquired absence of other specified parts of digestive tract: Secondary | ICD-10-CM

## 2022-04-24 DIAGNOSIS — M25512 Pain in left shoulder: Secondary | ICD-10-CM | POA: Diagnosis present

## 2022-04-24 LAB — URINALYSIS, ROUTINE W REFLEX MICROSCOPIC
Bilirubin Urine: NEGATIVE
Glucose, UA: NEGATIVE mg/dL
Hgb urine dipstick: NEGATIVE
Ketones, ur: NEGATIVE mg/dL
Nitrite: NEGATIVE
Protein, ur: NEGATIVE mg/dL
Specific Gravity, Urine: 1.006 (ref 1.005–1.030)
pH: 5 (ref 5.0–8.0)

## 2022-04-24 LAB — COMPREHENSIVE METABOLIC PANEL
ALT: 19 U/L (ref 0–44)
AST: 22 U/L (ref 15–41)
Albumin: 3.2 g/dL — ABNORMAL LOW (ref 3.5–5.0)
Alkaline Phosphatase: 293 U/L — ABNORMAL HIGH (ref 38–126)
Anion gap: 12 (ref 5–15)
BUN: 41 mg/dL — ABNORMAL HIGH (ref 8–23)
CO2: 31 mmol/L (ref 22–32)
Calcium: 8.3 mg/dL — ABNORMAL LOW (ref 8.9–10.3)
Chloride: 88 mmol/L — ABNORMAL LOW (ref 98–111)
Creatinine, Ser: 1.7 mg/dL — ABNORMAL HIGH (ref 0.44–1.00)
GFR, Estimated: 32 mL/min — ABNORMAL LOW (ref >60)
Glucose, Bld: 111 mg/dL — ABNORMAL HIGH (ref 70–99)
Potassium: 2.7 mmol/L — CL (ref 3.5–5.1)
Sodium: 131 mmol/L — ABNORMAL LOW (ref 135–145)
Total Bilirubin: 0.9 mg/dL (ref 0.3–1.2)
Total Protein: 6.6 g/dL (ref 6.5–8.1)

## 2022-04-24 LAB — VITAMIN B12: Vitamin B-12: 2707 pg/mL — ABNORMAL HIGH (ref 180–914)

## 2022-04-24 LAB — CBC WITH DIFFERENTIAL/PLATELET
Abs Immature Granulocytes: 0.02 10*3/uL (ref 0.00–0.07)
Basophils Absolute: 0 10*3/uL (ref 0.0–0.1)
Basophils Relative: 0 %
Eosinophils Absolute: 0.1 10*3/uL (ref 0.0–0.5)
Eosinophils Relative: 4 %
HCT: 30.5 % — ABNORMAL LOW (ref 36.0–46.0)
Hemoglobin: 9.7 g/dL — ABNORMAL LOW (ref 12.0–15.0)
Immature Granulocytes: 1 %
Lymphocytes Relative: 10 %
Lymphs Abs: 0.3 10*3/uL — ABNORMAL LOW (ref 0.7–4.0)
MCH: 27.4 pg (ref 26.0–34.0)
MCHC: 31.8 g/dL (ref 30.0–36.0)
MCV: 86.2 fL (ref 80.0–100.0)
Monocytes Absolute: 0.2 10*3/uL (ref 0.1–1.0)
Monocytes Relative: 7 %
Neutro Abs: 2.3 10*3/uL (ref 1.7–7.7)
Neutrophils Relative %: 78 %
Platelets: 79 10*3/uL — ABNORMAL LOW (ref 150–400)
RBC: 3.54 MIL/uL — ABNORMAL LOW (ref 3.87–5.11)
RDW: 17.9 % — ABNORMAL HIGH (ref 11.5–15.5)
WBC: 2.9 10*3/uL — ABNORMAL LOW (ref 4.0–10.5)
nRBC: 0.7 % — ABNORMAL HIGH (ref 0.0–0.2)

## 2022-04-24 LAB — IRON AND TIBC
Iron: 91 ug/dL (ref 28–170)
Saturation Ratios: 44 % — ABNORMAL HIGH (ref 10.4–31.8)
TIBC: 205 ug/dL — ABNORMAL LOW (ref 250–450)
UIBC: 114 ug/dL

## 2022-04-24 LAB — LACTIC ACID, PLASMA
Lactic Acid, Venous: 1 mmol/L (ref 0.5–1.9)
Lactic Acid, Venous: 1.1 mmol/L (ref 0.5–1.9)

## 2022-04-24 LAB — FERRITIN: Ferritin: 794 ng/mL — ABNORMAL HIGH (ref 11–307)

## 2022-04-24 LAB — MAGNESIUM: Magnesium: 1.4 mg/dL — ABNORMAL LOW (ref 1.7–2.4)

## 2022-04-24 LAB — GLUCOSE, CAPILLARY
Glucose-Capillary: 103 mg/dL — ABNORMAL HIGH (ref 70–99)
Glucose-Capillary: 105 mg/dL — ABNORMAL HIGH (ref 70–99)
Glucose-Capillary: 99 mg/dL (ref 70–99)

## 2022-04-24 LAB — PHENYTOIN LEVEL, TOTAL: Phenytoin Lvl: 23.9 ug/mL — ABNORMAL HIGH (ref 10.0–20.0)

## 2022-04-24 LAB — PROTIME-INR
INR: 1 (ref 0.8–1.2)
Prothrombin Time: 13.2 seconds (ref 11.4–15.2)

## 2022-04-24 LAB — FOLATE: Folate: 4.3 ng/mL — ABNORMAL LOW (ref >5.9)

## 2022-04-24 LAB — TSH: TSH: 12.952 u[IU]/mL — ABNORMAL HIGH (ref 0.350–4.500)

## 2022-04-24 MED ORDER — NOREPINEPHRINE 4 MG/250ML-% IV SOLN
0.0000 ug/min | INTRAVENOUS | Status: DC
  Administered 2022-04-24: 2 ug/min via INTRAVENOUS
  Administered 2022-04-25: 3 ug/min via INTRAVENOUS
  Filled 2022-04-24 (×2): qty 250

## 2022-04-24 MED ORDER — SODIUM CHLORIDE 0.9 % IV SOLN
2.0000 g | Freq: Two times a day (BID) | INTRAVENOUS | Status: DC
  Administered 2022-04-25 – 2022-04-26 (×3): 2 g via INTRAVENOUS
  Filled 2022-04-24 (×3): qty 12.5

## 2022-04-24 MED ORDER — PANTOPRAZOLE SODIUM 40 MG PO TBEC: 40.0000 mg | DELAYED_RELEASE_TABLET | Freq: Every morning | ORAL | Status: DC

## 2022-04-24 MED ORDER — DONEPEZIL HCL 5 MG PO TABS: 10.0000 mg | ORAL_TABLET | Freq: Every day | ORAL | Status: DC

## 2022-04-24 MED ORDER — MAGNESIUM SULFATE 4 GM/100ML IV SOLN
4.0000 g | Freq: Once | INTRAVENOUS | Status: AC
  Administered 2022-04-24: 4 g via INTRAVENOUS
  Filled 2022-04-24: qty 100

## 2022-04-24 MED ORDER — DONEPEZIL HCL 5 MG PO TABS: 5.0000 mg | ORAL_TABLET | Freq: Every day | ORAL | Status: DC

## 2022-04-24 MED ORDER — FERROUS SULFATE 325 (65 FE) MG PO TABS: 325.0000 mg | ORAL_TABLET | Freq: Two times a day (BID) | ORAL | Status: DC

## 2022-04-24 MED ORDER — SODIUM CHLORIDE 0.9% FLUSH: 3.0000 mL | INTRAVENOUS | Status: DC | PRN

## 2022-04-24 MED ORDER — TRAZODONE HCL 50 MG PO TABS: 50.0000 mg | ORAL_TABLET | Freq: Every evening | ORAL | Status: DC | PRN

## 2022-04-24 MED ORDER — ACETAMINOPHEN 650 MG RE SUPP: 650.0000 mg | Freq: Four times a day (QID) | RECTAL | Status: DC | PRN

## 2022-04-24 MED ORDER — SODIUM CHLORIDE 0.9% FLUSH
3.0000 mL | Freq: Two times a day (BID) | INTRAVENOUS | Status: DC
  Administered 2022-04-24 – 2022-04-30 (×11): 3 mL via INTRAVENOUS

## 2022-04-24 MED ORDER — ONDANSETRON HCL 4 MG PO TABS: 4.0000 mg | ORAL_TABLET | Freq: Four times a day (QID) | ORAL | Status: DC | PRN

## 2022-04-24 MED ORDER — SODIUM CHLORIDE 0.9 % IV SOLN: INTRAVENOUS | Status: DC | PRN

## 2022-04-24 MED ORDER — MOMETASONE FURO-FORMOTEROL FUM 200-5 MCG/ACT IN AERO
2.0000 | INHALATION_SPRAY | Freq: Two times a day (BID) | RESPIRATORY_TRACT | Status: DC
  Administered 2022-04-24 – 2022-04-29 (×8): 2 via RESPIRATORY_TRACT
  Filled 2022-04-24: qty 8.8

## 2022-04-24 MED ORDER — VANCOMYCIN HCL 1500 MG/300ML IV SOLN
1500.0000 mg | INTRAVENOUS | Status: DC
  Administered 2022-04-25: 1500 mg via INTRAVENOUS
  Filled 2022-04-24: qty 300

## 2022-04-24 MED ORDER — ASPIRIN 81 MG PO TBEC
81.0000 mg | DELAYED_RELEASE_TABLET | Freq: Every day | ORAL | Status: DC
  Administered 2022-04-29: 81 mg via ORAL
  Filled 2022-04-24 (×3): qty 1

## 2022-04-24 MED ORDER — AMLODIPINE BESYLATE 5 MG PO TABS
10.0000 mg | ORAL_TABLET | Freq: Every day | ORAL | Status: DC
  Administered 2022-04-24: 10 mg via ORAL
  Filled 2022-04-24 (×2): qty 2

## 2022-04-24 MED ORDER — POLYETHYLENE GLYCOL 3350 17 G PO PACK: 17.0000 g | PACK | Freq: Every day | ORAL | Status: DC | PRN

## 2022-04-24 MED ORDER — SENNOSIDES-DOCUSATE SODIUM 8.6-50 MG PO TABS
2.0000 | ORAL_TABLET | Freq: Every day | ORAL | Status: DC
  Administered 2022-04-25: 2 via ORAL
  Filled 2022-04-24 (×2): qty 2

## 2022-04-24 MED ORDER — METOPROLOL SUCCINATE ER 50 MG PO TB24: 200.0000 mg | ORAL_TABLET | Freq: Every day | ORAL | Status: DC

## 2022-04-24 MED ORDER — SODIUM CHLORIDE 0.9 % IV SOLN
2.0000 g | Freq: Once | INTRAVENOUS | Status: AC
  Administered 2022-04-24: 2 g via INTRAVENOUS
  Filled 2022-04-24: qty 12.5

## 2022-04-24 MED ORDER — SODIUM CHLORIDE 0.9 % IV BOLUS
1000.0000 mL | Freq: Once | INTRAVENOUS | Status: AC
  Administered 2022-04-24: 1000 mL via INTRAVENOUS

## 2022-04-24 MED ORDER — PRAVASTATIN SODIUM 40 MG PO TABS
40.0000 mg | ORAL_TABLET | Freq: Every day | ORAL | Status: DC
  Administered 2022-04-24: 40 mg via ORAL
  Filled 2022-04-24: qty 1

## 2022-04-24 MED ORDER — DONEPEZIL HCL 5 MG PO TABS
10.0000 mg | ORAL_TABLET | Freq: Every day | ORAL | Status: DC
  Filled 2022-04-24 (×2): qty 2

## 2022-04-24 MED ORDER — ACETAMINOPHEN 325 MG PO TABS
650.0000 mg | ORAL_TABLET | Freq: Four times a day (QID) | ORAL | Status: DC | PRN
  Administered 2022-04-24: 650 mg via ORAL
  Filled 2022-04-24: qty 2

## 2022-04-24 MED ORDER — FENTANYL CITRATE PF 50 MCG/ML IJ SOSY
25.0000 ug | PREFILLED_SYRINGE | INTRAMUSCULAR | Status: DC | PRN
  Administered 2022-04-24 – 2022-04-30 (×15): 25 ug via INTRAVENOUS
  Filled 2022-04-24 (×15): qty 1

## 2022-04-24 MED ORDER — VANCOMYCIN HCL 2000 MG/400ML IV SOLN
2000.0000 mg | Freq: Once | INTRAVENOUS | Status: AC
  Administered 2022-04-24: 2000 mg via INTRAVENOUS
  Filled 2022-04-24: qty 400

## 2022-04-24 MED ORDER — ALBUTEROL SULFATE (2.5 MG/3ML) 0.083% IN NEBU
2.5000 mg | INHALATION_SOLUTION | RESPIRATORY_TRACT | Status: DC | PRN
  Administered 2022-04-29 (×2): 2.5 mg via RESPIRATORY_TRACT
  Filled 2022-04-24 (×2): qty 3

## 2022-04-24 MED ORDER — ASPIRIN 81 MG PO TBEC: 81.0000 mg | DELAYED_RELEASE_TABLET | Freq: Every day | ORAL | Status: DC

## 2022-04-24 MED ORDER — CALCIUM CARBONATE ANTACID 500 MG PO CHEW
2.0000 | CHEWABLE_TABLET | Freq: Two times a day (BID) | ORAL | Status: DC
  Administered 2022-04-25: 400 mg via ORAL
  Filled 2022-04-24 (×4): qty 2

## 2022-04-24 MED ORDER — POTASSIUM CHLORIDE CRYS ER 20 MEQ PO TBCR
40.0000 meq | EXTENDED_RELEASE_TABLET | ORAL | Status: AC
  Administered 2022-04-24 (×2): 40 meq via ORAL
  Filled 2022-04-24 (×2): qty 2

## 2022-04-24 MED ORDER — FOLIC ACID 1 MG PO TABS
1.0000 mg | ORAL_TABLET | Freq: Every day | ORAL | Status: DC
  Administered 2022-04-24: 1 mg via ORAL
  Filled 2022-04-24 (×3): qty 1

## 2022-04-24 MED ORDER — ESCITALOPRAM OXALATE 10 MG PO TABS
10.0000 mg | ORAL_TABLET | Freq: Every day | ORAL | Status: DC
  Administered 2022-04-24: 10 mg via ORAL
  Filled 2022-04-24 (×3): qty 1

## 2022-04-24 MED ORDER — POTASSIUM CHLORIDE 10 MEQ/100ML IV SOLN
10.0000 meq | Freq: Once | INTRAVENOUS | Status: AC
  Administered 2022-04-24: 10 meq via INTRAVENOUS
  Filled 2022-04-24: qty 100

## 2022-04-24 MED ORDER — LEVOTHYROXINE SODIUM 75 MCG PO TABS
150.0000 ug | ORAL_TABLET | Freq: Every day | ORAL | Status: DC
  Administered 2022-04-25: 150 ug via ORAL
  Filled 2022-04-24 (×2): qty 2

## 2022-04-24 MED ORDER — HYDRALAZINE HCL 20 MG/ML IJ SOLN
10.0000 mg | Freq: Four times a day (QID) | INTRAMUSCULAR | Status: DC | PRN
  Administered 2022-04-27 – 2022-04-29 (×6): 10 mg via INTRAVENOUS
  Filled 2022-04-24 (×5): qty 1

## 2022-04-24 MED ORDER — ONDANSETRON HCL 4 MG/2ML IJ SOLN: 4.0000 mg | Freq: Four times a day (QID) | INTRAMUSCULAR | Status: DC | PRN

## 2022-04-24 MED ORDER — INSULIN ASPART 100 UNIT/ML IJ SOLN
0.0000 [IU] | Freq: Three times a day (TID) | INTRAMUSCULAR | Status: DC
  Administered 2022-04-26 – 2022-04-28 (×5): 1 [IU] via SUBCUTANEOUS
  Administered 2022-04-28: 3 [IU] via SUBCUTANEOUS

## 2022-04-24 MED ORDER — INSULIN ASPART 100 UNIT/ML IJ SOLN: 0.0000 [IU] | Freq: Every day | INTRAMUSCULAR | Status: DC

## 2022-04-24 MED ORDER — PANTOPRAZOLE SODIUM 40 MG PO TBEC
40.0000 mg | DELAYED_RELEASE_TABLET | Freq: Every day | ORAL | Status: DC
  Filled 2022-04-24 (×3): qty 1

## 2022-04-24 MED ORDER — FERROUS SULFATE 325 (65 FE) MG PO TABS
325.0000 mg | ORAL_TABLET | Freq: Every day | ORAL | Status: DC
  Administered 2022-04-24 – 2022-04-25 (×2): 325 mg via ORAL
  Filled 2022-04-24 (×4): qty 1

## 2022-04-24 MED ORDER — CHLORHEXIDINE GLUCONATE CLOTH 2 % EX PADS
6.0000 | MEDICATED_PAD | Freq: Every day | CUTANEOUS | Status: DC
  Administered 2022-04-25 – 2022-04-30 (×6): 6 via TOPICAL

## 2022-04-24 MED ORDER — SODIUM CHLORIDE 0.9 % IV SOLN
500.0000 mg | INTRAVENOUS | Status: DC
  Administered 2022-04-24 – 2022-04-29 (×6): 500 mg via INTRAVENOUS
  Filled 2022-04-24 (×6): qty 5

## 2022-04-24 MED ORDER — PHENYTOIN SODIUM EXTENDED 100 MG PO CAPS
100.0000 mg | ORAL_CAPSULE | Freq: Every day | ORAL | Status: DC
  Administered 2022-04-24: 100 mg via ORAL
  Filled 2022-04-24 (×5): qty 1

## 2022-04-24 MED ORDER — OXYCODONE HCL 5 MG PO TABS
5.0000 mg | ORAL_TABLET | ORAL | Status: DC | PRN
  Administered 2022-04-25: 5 mg via ORAL
  Filled 2022-04-24 (×3): qty 1

## 2022-04-24 MED ORDER — BISACODYL 10 MG RE SUPP: 10.0000 mg | Freq: Every day | RECTAL | Status: DC | PRN

## 2022-04-24 MED ORDER — ALLOPURINOL 100 MG PO TABS
200.0000 mg | ORAL_TABLET | Freq: Every day | ORAL | Status: DC
  Administered 2022-04-24: 200 mg via ORAL
  Filled 2022-04-24 (×3): qty 2

## 2022-04-24 NOTE — ED Notes (Signed)
Attempted to call report; rang until voicemail picked up.

## 2022-04-24 NOTE — ED Triage Notes (Signed)
Pt BIB [facility] from [facility] Per EMS, pt had pneumonia 10 days ago, treated with abx, began c/o urinary burning yesterday, dx with UTI and started on abx at facility. According to facility, pt with 92.2 temp today and HR in the 40s, so sent here for evaluation. Given 125 mL NS en route.

## 2022-04-24 NOTE — ED Notes (Signed)
Patient transported to CT 

## 2022-04-24 NOTE — ED Notes (Signed)
Attempted rectal temp, no opening.

## 2022-04-24 NOTE — ED Notes (Signed)
Guardian [name] [phone] at bedside stated his director Jolene Schimke could be reached regarding code status @ [phone].

## 2022-04-24 NOTE — Progress Notes (Signed)
Pharmacy Antibiotic Note  [name] is a 72 y.o. female admitted on 04/24/2022 with  hypothermia and bradycardia .  Pharmacy has been consulted for vancomycin dosing.  Tmin 89, wbc 2.9, scr 1.7. Cultures drawn in ED.  Vancomycin 1500 mg IV Q 48 hrs. Goal AUC 400-550. Expected AUC: 536 SCr used: 1.7   Plan: Vancomycin 1500 IV every 48 hours.  Goal trough 15-20 mcg/mL.     Temp (24hrs), Avg:89.6 F (32 C), Min:89.1 F (31.7 C), Max:90 F (32.2 C)  Recent Labs  Lab 04/24/22 1135  WBC 2.9*  CREATININE 1.70*  LATICACIDVEN 1.0    CrCl cannot be calculated (Unknown ideal weight.).    No Known Allergies Thank you for allowing pharmacy to be a part of this patient's care.  Erin Hearing PharmD., BCPS Clinical Pharmacist 04/24/2022 1:47 PM

## 2022-04-24 NOTE — Progress Notes (Signed)
BP has been trending down this shift, presumed secondary to sepsis. She was given a liter NS bolus without improvement. Plan to start another liter bolus of NS and start Levophed.

## 2022-04-24 NOTE — Consult Note (Signed)
NAME:  [name], MRN:  [id number], DOB:  03/08/1951, LOS: 0 ADMISSION DATE:  04/24/2022, CONSULTATION DATE:  04/24/2022  REFERRING MD:  Roderic Palau, EDP, CHIEF COMPLAINT: Hypothermia, bradycardia  History of Present Illness:  72 year old woman brought in by EMS from [facility] facility due to hypothermia temperature 92.2 and bradycardia in the 40s. By report, she was hospitalized for pneumonia at 10 days ago treated with antibiotics, was diagnosed with UTI at the facility 1 day PTA and started on antibiotic.  ED PA obtained history of recent fall and she complained of right hip and left shoulder pain She was placed on the warmer and temperature improved from 89.1 on arrival to 92.3, remained bradycardic in the 40s, normotensive Initial labs significant for hyponatremia 131, hypokalemia 2.7, hypomagnesemia 1.4, AKI BUN/creatinine 41/1.7, leukopenia with stable anemia and mild thrombocytopenia UA showed trace LE with 6-10 white cells and many bacteria Chest x-ray showed patchy right upper lobe infiltrate with moderate right pleural effusion versus elevated right hemidiaphragm and small left effusion   Pertinent  Medical History  Diabetes type 2, insulin requiring Hypertension 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 rate 12, height 5\' 6"  (1.676 m), weight 102 kg, SpO2 98 %.       No intake or output data in the 24 hours ending 04/24/22 1523 Filed Weights   04/24/22 1347  Weight: 102 kg    Examination: General: Elderly woman, no distress moans and  groans, complaining of right arm pain from IV site HENT: Mild pallor, no icterus, short neck cannot assess JVD Lungs: Decreased sounds bilateral, no accessory muscle use Cardiovascular: S1 S 2 bradycardia, no murmur Abdomen: Soft, nontender, left colostomy with green stool Extremities: 2+ edema Neuro: Awake, follows one-step commands, moves all 4 extremities GU: Clear urine, 1.5 L in Foley    Resolved Hospital Problem list     Assessment & Plan:  Hypothermia and bradycardia, this nursing home resident recently treated for pneumonia and started on treatment for UTI 1 day ago at facility  -Could be related to sepsis -Also on metoprolol -Will need to rule out other etiologies such as hypothyroidism  -Her blood pressure is dropping but should be fluid responsive -Empiric antibiotics to cover nosocomial organisms, cefepime and vancomycin should be okay -Will need admission to ICU and gradual rewarming, while awaiting further data -Hypokalemia, hypomagnesemia should be repleted -Okay to admit to any pain ICU. -Awaiting clarification of CODE STATUS from facility/guardian  Pleural effusion versus elevated right hemidiaphragm -repeat chest x-ray in a.m. Previous CT chest 07/2021 showed elevated right hemidiaphragm  PCCM will follow peripherally   Best Practice (right click and "Reselect all SmartList Selections" daily)   Diet/type: NPO DVT prophylaxis: prophylactic heparin  GI prophylaxis: N/A Lines: N/A Foley:  N/A Code Status:  full code Last date of multidisciplinary goals of care discussion [NA]  Labs   CBC: Recent Labs  Lab 04/24/22 1135  WBC 2.9*  NEUTROABS 2.3  HGB 9.7*  HCT 30.5*  MCV 86.2  PLT 79*    Basic Metabolic Panel: Recent Labs  Lab 04/24/22 1135  04/24/22 1241  NA 131*  --   K 2.7*  --   CL 88*  --   CO2 31  --   GLUCOSE 111*  --   BUN 41*  --   CREATININE 1.70*  --   CALCIUM 8.3*  --   MG  --  1.4*   GFR: Estimated Creatinine Clearance:  37.1 mL/min (A) (by C-G formula based on SCr of 1.7 mg/dL (H)). Recent Labs  Lab 04/24/22 1135 04/24/22 1241  WBC 2.9*  --   LATICACIDVEN 1.0 1.1    Liver Function Tests: Recent Labs  Lab 04/24/22 1135  AST 22  ALT 19  ALKPHOS 293*  BILITOT 0.9  PROT 6.6  ALBUMIN 3.2*   No results for input(s): "LIPASE", "AMYLASE" in the last 168 hours. No results for input(s): "AMMONIA" in the last 168 hours.  ABG    Component Value Date/Time   PHART 7.377 10/27/2014 0434   PCO2ART 42.1 10/27/2014 0434   PO2ART 125 (H) 10/27/2014 0434   HCO3 24.2 (H) 10/27/2014 0434   TCO2 25.5 10/27/2014 0434   ACIDBASEDEF 0.3 10/27/2014 0434   O2SAT 98.6 10/27/2014 0434     Coagulation Profile: Recent Labs  Lab 04/24/22 1135  INR 1.0    Cardiac Enzymes: No results for input(s): "CKTOTAL", "CKMB", "CKMBINDEX", "TROPONINI" in the last 168 hours.  HbA1C: Hgb A1c MFr Bld  Date/Time Value Ref Range Status  11/23/2021 09:13 AM 6.3 (H) 4.8 - 5.6 % Final    Comment:    (NOTE) Pre diabetes:          5.7%-6.4%  Diabetes:              >6.4%  Glycemic control for   <7.0% adults with diabetes   10/26/2014 09:15 AM 6.1 (H) 4.8 - 5.6 % Final    Comment:    (NOTE)         Pre-diabetes: 5.7 - 6.4         Diabetes: >6.4         Glycemic control for adults with diabetes: <7.0     CBG: No results for input(s): "GLUCAP" in the last 168 hours.  Review of Systems:   Unable to obtain due to poor mental status  Past Medical History:  She,  has a past medical history of Arthritis, Asthma, Cancer ([place]), Chronic renal disease, stage 3, moderately decreased glomerular filtration rate between 30-59 mL/min/1.73 square meter ([place]) (04/27/2014), Depression, Diabetes mellitus, GERD (gastroesophageal reflux disease), Gout, History of recurrent UTIs, Hypertension, Iron deficiency (04/27/2014), Lung cancer ([place]), Mental retardation, Numbness and tingling in hands, Rectal cancer ([place]), Seizures ([place]), Shortness  of breath, and Vitamin B 12 deficiency (02/01/2015).   Surgical History:   Past Surgical History:  Procedure Laterality Date   ABDOMINAL HYSTERECTOMY     ABDOMINAL PERINEAL BOWEL RESECTION N/A 07/26/2013   Procedure:  ABDOMINAL PERINEAL RESECTION;  Surgeon: Jamesetta So, MD;  Location: [facility];  Service: General;  Laterality: N/A;   CATARACT EXTRACTION W/PHACO Left 11/12/2021   Procedure: CATARACT EXTRACTION PHACO AND INTRAOCULAR LENS PLACEMENT ([place]);  Surgeon: Baruch Goldmann, MD;  Location: [facility];  Service: Ophthalmology;  Laterality: Left;  CDE 6.24   CATARACT EXTRACTION W/PHACO Right 11/26/2021   Procedure: CATARACT EXTRACTION PHACO AND INTRAOCULAR LENS PLACEMENT ([facility]);  Surgeon: Baruch Goldmann, MD;  Location: [facility];  Service: Ophthalmology;  Laterality: Right;  CDE:8.67   COLON SURGERY  2015   bowel resection/w colostomy   COLONOSCOPY N/A 02/04/2013  Procedure: COLONOSCOPY;  Surgeon: Rogene Houston, MD;  [facility];  Service: Endoscopy;  Laterality: N/A;  225   COLONOSCOPY N/A 05/12/2014   Procedure: COLONOSCOPY;  Surgeon: Rogene Houston, MD;  [facility];  Service: Endoscopy;  Laterality: N/A;  1030   COLONOSCOPY WITH ESOPHAGOGASTRODUODENOSCOPY (EGD) N/A 01/14/2013   Procedure: COLONOSCOPY WITH ESOPHAGOGASTRODUODENOSCOPY (EGD);  Surgeon: Rogene Houston, MD;  [facility];  Service: Endoscopy;  Laterality: N/A;  250-moved to 315 Ann to notify pt   COLOSTOMY Left 07/26/2013   Procedure: COLOSTOMY;  Surgeon: Jamesetta So, MD;  [facility]: [facility];  Service: General;  Laterality: Left;   EUS N/A 02/18/2013   Procedure: LOWER ENDOSCOPIC ULTRASOUND (EUS);  Surgeon: Milus Banister, MD;  Location: [facility];  Service: Endoscopy;  Laterality: N/A;   FLEXIBLE SIGMOIDOSCOPY N/A 07/26/2013   Procedure: FLEXIBLE SIGMOIDOSCOPY;  Surgeon: Jamesetta So, MD;  [facility]: [facility];  Service: General;  Laterality: N/A;   LYMPH NODE DISSECTION Left 10/26/2014    Procedure: LYMPH NODE DISSECTION;  Surgeon: [name], MD;  Location: [place];  Service: Thoracic;  Laterality: Left;   MULTIPLE EXTRACTIONS WITH ALVEOLOPLASTY N/A 11/23/2012   Procedure: MULTIPLE EXTRACION 1, 2, 4, 5, 6, 7, 8, 9, 10, 11, 12, 13, 14, 17, 18, 20, 23, 24, 25, 26, 28, 29, 32 WITH ALVEOLOPLASTY, REMOVE BILATERAL TORI;  Surgeon: Gae Bon, DDS;  Location: [place];  Service: Oral Surgery;  Laterality: N/A;   PORTACATH PLACEMENT Left 01/02/2015   Procedure: INSERTION PORT-A-CATH;  Surgeon: Aviva Signs, MD;  [facility]: [facility];  Service: General;  Laterality: Left;   SALPINGOOPHORECTOMY Bilateral 07/26/2013   Procedure: SALPINGO OOPHORECTOMY;  Surgeon: Jamesetta So, MD;  [facility]: [facility];  Service: General;  Laterality: Bilateral;   SEGMENTECOMY Left 10/26/2014   Procedure: LEFT LOWER LOBE SUPERIOR SEGMENTECTOMY;  Surgeon: [name], MD;  Location: [place];  Service: Thoracic;  Laterality: Left;   SUPRACERVICAL ABDOMINAL HYSTERECTOMY N/A 07/26/2013   Procedure: HYSTERECTOMY SUPRACERVICAL ABDOMINAL ;  Surgeon: Jamesetta So, MD;  [facility];  Service: General;  Laterality: N/A;   VIDEO ASSISTED THORACOSCOPY Left 10/26/2014   Procedure: LEFT VIDEO ASSISTED THORACOSCOPY;  Surgeon: [name], MD;  Location: [place];  Service: Thoracic;  Laterality: Left;     Social History:   reports that she quit smoking about 13 years ago. Her smoking use included cigarettes. She has never used smokeless tobacco. She reports that she does not drink alcohol and does not use drugs.   Family History:  Her Family history is unknown by patient.   Allergies No Known Allergies   Home Medications  Prior to Admission medications   Medication Sig Start Date End Date Taking? Authorizing Provider  allopurinol (ZYLOPRIM) 300 MG tablet Take 200 mg by mouth daily. 12/28/20  Yes [provider]  amLODipine (NORVASC) 5 MG tablet Take 5 mg by mouth daily. 06/23/19  Yes  [provider]  ascorbic acid (VITAMIN C) 500 MG tablet Take 500 mg by mouth daily.   Yes [provider]  aspirin EC 81 MG tablet Take 81 mg by mouth daily.   Yes [provider]  calcium carbonate (TUMS) 500 MG chewable tablet Chew 2 tablets (400 mg of elemental calcium total) by mouth 3 (three) times daily. Patient taking differently: Chew 2 tablets by mouth 2 (two) times daily. 12/02/16  Yes Twana First, MD  Cholecalciferol 25 MCG (1000 UT) TBDP  Take 1 tablet by mouth daily.   Yes [provider]  diclofenac Sodium (VOLTAREN) 1 % GEL Apply 2 g topically 4 (four) times daily.   Yes [provider]  docusate sodium (COLACE) 100 MG capsule Take 100 mg by mouth 2 (two) times daily.   Yes [provider]  donepezil (ARICEPT) 5 MG tablet Take 10 mg by mouth daily. 06/01/20  Yes [provider]  escitalopram (LEXAPRO) 10 MG tablet Take 10 mg by mouth daily. 08/30/21  Yes [provider]  ferrous sulfate 325 (65 FE) MG tablet Take 325 mg by mouth 2 (two) times daily with a meal.   Yes [provider]  fish oil-omega-3 fatty acids 1000 MG capsule Take 1 g by mouth 2 (two) times daily.   Yes [provider]  Fluticasone-Salmeterol (ADVAIR) 250-50 MCG/DOSE AEPB Inhale 1 puff into the lungs every 12 (twelve) hours.   Yes [provider]  furosemide (LASIX) 40 MG tablet Take 40 mg by mouth 2 (two) times daily. 09/01/19  Yes [provider]  insulin lispro (HUMALOG) 100 UNIT/ML injection Inject 1-5 Units into the skin 3 (three) times daily before meals.   Yes [provider]  metoprolol (TOPROL-XL) 200 MG 24 hr tablet Take 200 mg by mouth in the morning and at bedtime. 09/30/17  Yes [provider]  montelukast (SINGULAIR) 10 MG tablet Take 10 mg by mouth at bedtime.   Yes [provider]  pantoprazole (PROTONIX) 40 MG tablet Take 40 mg by mouth every morning.    Yes [provider]  phenytoin (DILANTIN) 100 MG ER capsule Take 100 mg by mouth daily.   Yes [provider]  pravastatin (PRAVACHOL) 40 MG tablet Take 40 mg by mouth every evening.    Yes [provider]  traZODone (DESYREL) 50 MG tablet Take 50 mg by mouth at bedtime as needed for sleep. 01/26/18  Yes [provider]  Graham test strip  12/22/17   [provider]  Lancets Misc. (UNISTIK 3 COMFORT) Mazie  06/15/20   [provider]  metFORMIN (GLUCOPHAGE) 1000 MG tablet Take by mouth. Patient not taking: Reported on 04/24/2022 01/26/18   [provider]  NOVOFINE AUTOCOVER 30G X 8 MM MISC USE ONCE DAILY WITHOINSULIN PEN(S). 05/07/19   [provider]  OS-CAL CALCIUM + D3 500-200 MG-UNIT TABS TAKE 2 TABLETS BY MOUTH DAILY WITH BREAKFAST. Patient not taking: Reported on 04/24/2022 07/29/17   Holley Bouche, NP       Kara Mead MD. FCCP. [facility] Pager : [phone]  If no response to pager , please call [phone] until 7 pm After 7:00 pm call Elink  [phone]   04/24/2022

## 2022-04-24 NOTE — ED Provider Notes (Signed)
[facility] Provider Note   CSN: [id number] Arrival date & time: 04/24/22  7564     History  Chief Complaint  Patient presents with   Altered Mental Status    [name] is a 72 y.o. female.  Patient has a history of hypertension diabetes seizures lung cancer.  She is at a nursing home and was more confused than normal and they found her temperature to be low.  Also she has had a recent fall and is complaining of right hip and left shoulder pain  The history is provided by the patient and the nursing home. No language interpreter was used.  Altered Mental Status Presenting symptoms: confusion   Severity:  Mild Most recent episode:  Today Episode history:  Continuous Timing:  Constant Progression:  Waxing and waning Chronicity:  New Context: not alcohol use   Associated symptoms: no abdominal pain, no hallucinations, no headaches, no rash and no seizures        Home Medications Prior to Admission medications   Medication Sig Start Date End Date Taking? Authorizing Provider  allopurinol (ZYLOPRIM) 300 MG tablet Take 200 mg by mouth daily. 12/28/20  Yes [provider]  amLODipine (NORVASC) 5 MG tablet Take 5 mg by mouth daily. 06/23/19  Yes [provider]  ascorbic acid (VITAMIN C) 500 MG tablet Take 500 mg by mouth daily.   Yes [provider]  aspirin EC 81 MG tablet Take 81 mg by mouth daily.   Yes [provider]  calcium carbonate (TUMS) 500 MG chewable tablet Chew 2 tablets (400 mg of elemental calcium total) by mouth 3 (three) times daily. Patient taking differently: Chew 2 tablets by mouth 2 (two) times daily. 12/02/16  Yes Twana First, MD  Cholecalciferol 25 MCG (1000 UT) TBDP Take 1 tablet by mouth daily.   Yes [provider]  diclofenac Sodium (VOLTAREN) 1 % GEL Apply 2 g topically 4 (four) times daily.   Yes [provider]  docusate sodium (COLACE) 100 MG  capsule Take 100 mg by mouth 2 (two) times daily.   Yes [provider]  donepezil (ARICEPT) 5 MG tablet Take 10 mg by mouth daily. 06/01/20  Yes [provider]  escitalopram (LEXAPRO) 10 MG tablet Take 10 mg by mouth daily. 08/30/21  Yes [provider]  ferrous sulfate 325 (65 FE) MG tablet Take 325 mg by mouth 2 (two) times daily with a meal.   Yes [provider]  fish oil-omega-3 fatty acids 1000 MG capsule Take 1 g by mouth 2 (two) times daily.   Yes [provider]  Fluticasone-Salmeterol (ADVAIR) 250-50 MCG/DOSE AEPB Inhale 1 puff into the lungs every 12 (twelve) hours.   Yes [provider]  furosemide (LASIX) 40 MG tablet Take 40 mg by mouth 2 (two) times daily. 09/01/19  Yes [provider]  insulin lispro (HUMALOG) 100 UNIT/ML injection Inject 1-5 Units into the skin 3 (three) times daily before meals.   Yes [provider]  metoprolol (TOPROL-XL) 200 MG 24 hr tablet Take 200 mg by mouth in the morning and at bedtime. 09/30/17  Yes [provider]  montelukast (SINGULAIR) 10 MG tablet Take 10 mg by mouth at bedtime.   Yes [provider]  pantoprazole (PROTONIX) 40 MG tablet Take 40 mg by mouth every morning.    Yes [provider]  phenytoin (DILANTIN) 100 MG ER capsule Take 100 mg by mouth daily.  Yes [provider]  pravastatin (PRAVACHOL) 40 MG tablet Take 40 mg by mouth every evening.    Yes [provider]  traZODone (DESYREL) 50 MG tablet Take 50 mg by mouth at bedtime as needed for sleep. 01/26/18  Yes [provider]  [place] test strip  12/22/17   [provider]  Lancets Misc. (UNISTIK 3 COMFORT) [place]  06/15/20   [provider]  metFORMIN (GLUCOPHAGE) 1000 MG tablet Take by mouth. Patient not taking: Reported on 04/24/2022 01/26/18   [provider]  NOVOFINE AUTOCOVER 30G X 8 MM MISC USE ONCE DAILY WITHOINSULIN  PEN(S). 05/07/19   [provider]  OS-CAL CALCIUM + D3 500-200 MG-UNIT TABS TAKE 2 TABLETS BY MOUTH DAILY WITH BREAKFAST. Patient not taking: Reported on 04/24/2022 07/29/17   Holley Bouche, NP      Allergies    Patient has no known allergies.    Review of Systems   Review of Systems  Constitutional:  Negative for appetite change and fatigue.  HENT:  Negative for congestion, ear discharge and sinus pressure.   Eyes:  Negative for discharge.  Respiratory:  Negative for cough.   Cardiovascular:  Negative for chest pain.  Gastrointestinal:  Negative for abdominal pain and diarrhea.  Genitourinary:  Negative for frequency and hematuria.  Musculoskeletal:  Negative for back pain.  Skin:  Negative for rash.  Neurological:  Negative for seizures and headaches.  Psychiatric/Behavioral:  Positive for confusion. Negative for hallucinations.     Physical Exam Updated Vital Signs BP 134/66   Pulse (!) 43   Temp (!) 90 F (32.2 C)   Resp 12   Ht 5\' 6"  (1.676 m)   Wt 102 kg Comment: from 12/23 office visit  SpO2 98%   BMI 36.29 kg/m  Physical Exam Vitals and nursing note reviewed.  Constitutional:      Appearance: She is well-developed.     Comments: Lethargic  HENT:     Head: Normocephalic.     Nose: Nose normal.  Eyes:     General: No scleral icterus.    Conjunctiva/sclera: Conjunctivae normal.  Neck:     Thyroid: No thyromegaly.  Cardiovascular:     Rate and Rhythm: Normal rate and regular rhythm.     Heart sounds: No murmur heard.    No friction rub. No gallop.  Pulmonary:     Breath sounds: No stridor. No wheezing or rales.  Chest:     Chest wall: No tenderness.  Abdominal:     General: There is no distension.     Tenderness: There is no abdominal tenderness. There is no rebound.  Musculoskeletal:     Cervical back: Neck supple.     Comments: Tender left shoulder and right hip  Lymphadenopathy:     Cervical: No cervical adenopathy.  Skin:     Findings: No erythema or rash.  Neurological:     Mental Status: She is oriented to person, place, and time.     Motor: No abnormal muscle tone.     Coordination: Coordination normal.  Psychiatric:        Behavior: Behavior normal.     ED Results / Procedures / Treatments   Labs (all labs ordered are listed, but only abnormal results are displayed) Labs Reviewed  COMPREHENSIVE METABOLIC PANEL - Abnormal; Notable for the following components:      Result Value   Sodium 131 (*)    Potassium 2.7 (*)    Chloride  88 (*)    Glucose, Bld 111 (*)    BUN 41 (*)    Creatinine, Ser 1.70 (*)    Calcium 8.3 (*)    Albumin 3.2 (*)    Alkaline Phosphatase 293 (*)    GFR, Estimated 32 (*)    All other components within normal limits  CBC WITH DIFFERENTIAL/PLATELET - Abnormal; Notable for the following components:   WBC 2.9 (*)    RBC 3.54 (*)    Hemoglobin 9.7 (*)    HCT 30.5 (*)    RDW 17.9 (*)    Platelets 79 (*)    nRBC 0.7 (*)    Lymphs Abs 0.3 (*)    All other components within normal limits  URINALYSIS, ROUTINE W REFLEX MICROSCOPIC - Abnormal; Notable for the following components:   Color, Urine STRAW (*)    Leukocytes,Ua TRACE (*)    Bacteria, UA MANY (*)    All other components within normal limits  MAGNESIUM - Abnormal; Notable for the following components:   Magnesium 1.4 (*)    All other components within normal limits  FERRITIN - Abnormal; Notable for the following components:   Ferritin 794 (*)    All other components within normal limits  FOLATE - Abnormal; Notable for the following components:   Folate 4.3 (*)    All other components within normal limits  IRON AND TIBC - Abnormal; Notable for the following components:   TIBC 205 (*)    Saturation Ratios 44 (*)    All other components within normal limits  CULTURE, BLOOD (ROUTINE X 2)  LACTIC ACID, PLASMA  LACTIC ACID, PLASMA  PROTIME-INR  URINALYSIS, W/ REFLEX TO CULTURE (INFECTION SUSPECTED)  VITAMIN B12     EKG EKG Interpretation  Date/Time:  Wednesday April 24 2022 10:24:18 EST Ventricular Rate:  41 PR Interval:  288 QRS Duration: 105 QT Interval:  687 QTC Calculation: 568 R Axis:   -5 Text Interpretation: Sinus bradycardia Prolonged PR interval Anterior infarct, old Nonspecific T abnormalities, lateral leads Prolonged QT interval Confirmed by Milton Ferguson [id number]) on 04/24/2022 10:44:37 AM  Radiology DG Hip Unilat W or Wo Pelvis 2-3 Views Right  Result Date: 04/24/2022 CLINICAL DATA:  Fall EXAM: DG HIP (WITH OR WITHOUT PELVIS) 2-3V RIGHT COMPARISON:  07/17/2021 FINDINGS: Suspect acute nondisplaced fracture of the right femoral neck. Bilateral hip joints intact without dislocation. 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 Degenerative changes of both SI joints. No pelvic diastasis. IMPRESSION: 1. Suspect nondisplaced right femoral neck fracture. Further evaluation can be obtained with CT. 2. 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 Electronically Signed   By: Davina Poke D.O.   On: 04/24/2022 13:32   DG Shoulder Left  Result Date: 04/24/2022 CLINICAL DATA:  Fall, fever EXAM: LEFT SHOULDER - 2+ VIEW COMPARISON:  11/30/2017 FINDINGS: Osseous demineralization. AC joint alignment normal. Mild glenohumeral degenerative changes. Humerus approximates the undersurface of the acromion consistent with chronic rotator cuff tear. Small calcified nodule identified adjacent to humeral head, suspect rotator cuff calcification. No acute fracture, dislocation, or bone destruction. Visualized ribs intact. IMPRESSION: LEFT glenohumeral degenerative changes and evidence of chronic rotator cuff tear. Suspected calcific tendinitis of the rotator cuff. No acute osseous abnormalities. Electronically Signed   By: Lavonia Dana M.D.   On: 04/24/2022 13:25   DG Chest Port 1 View  Result Date: 04/24/2022 CLINICAL DATA:  Pneumonia, shortness of breath,  treated with antibiotics, urinary burning began  yesterday, diagnosed with UTI and started on antibiotics EXAM: PORTABLE CHEST 1 VIEW COMPARISON:  Portable exam 1059 hours compared to 06/03/2018 FINDINGS: LEFT subclavian Port-A-Cath with tip projecting over LEFT brachiocephalic vein. Normal heart size and mediastinal contours. Small to moderate RIGHT pleural effusion and basilar atelectasis with underlying eventration of RIGHT diaphragm noted. Small LEFT basilar effusion. Patchy RIGHT upper lobe infiltrate. No pneumothorax or acute osseous findings. IMPRESSION: Bibasilar effusions and atelectasis greater on RIGHT. Patchy RIGHT upper lobe infiltrate. Electronically Signed   By: Lavonia Dana M.D.   On: 04/24/2022 11:08    Procedures Procedures    Medications Ordered in ED Medications  azithromycin (ZITHROMAX) 500 mg in sodium chloride 0.9 % 250 mL IVPB (500 mg Intravenous New Bag/Given 04/24/22 1406)  potassium chloride SA (KLOR-CON M) CR tablet 40 mEq (40 mEq Oral Given 04/24/22 1347)  amLODipine (NORVASC) tablet 10 mg (10 mg Oral Given 04/24/22 1347)  aspirin EC tablet 81 mg (has no administration in time range)  ferrous sulfate tablet 325 mg (325 mg Oral Given 04/24/22 1348)  pantoprazole (PROTONIX) EC tablet 40 mg (has no administration in time range)  pravastatin (PRAVACHOL) tablet 40 mg (has no administration in time range)  phenytoin (DILANTIN) ER capsule 100 mg (has no administration in time range)  albuterol (PROVENTIL) (2.5 MG/3ML) 0.083% nebulizer solution 2.5 mg (has no administration in time range)  insulin aspart (novoLOG) injection 0-6 Units (has no administration in time range)  insulin aspart (novoLOG) injection 0-5 Units (has no administration in time range)  hydrALAZINE (APRESOLINE) injection 10 mg (has no administration in time range)  donepezil (ARICEPT) tablet 10 mg (has no administration in time range)  vancomycin (VANCOREADY) IVPB 2000 mg/400 mL (has no administration in time range)   fentaNYL (SUBLIMAZE) injection 25 mcg (has no administration in time range)  sodium chloride 0.9 % bolus 1,000 mL (1,000 mLs Intravenous New Bag/Given 04/24/22 1045)  ceFEPIme (MAXIPIME) 2 g in sodium chloride 0.9 % 100 mL IVPB (2 g Intravenous New Bag/Given 04/24/22 1348)  potassium chloride 10 mEq in 100 mL IVPB (10 mEq Intravenous New Bag/Given 04/24/22 1354)    ED Course/ Medical Decision Making/ A&P  ,edcr CRITICAL CARE Performed by: Milton Ferguson Total critical care time: 45 minutes Critical care time was exclusive of separately billable procedures and treating other patients. Critical care was necessary to treat or prevent imminent or life-threatening deterioration. Critical care was time spent personally by me on the following activities: development of treatment plan with patient and/or surrogate as well as nursing, discussions with consultants, evaluation of patient's response to treatment, examination of patient, obtaining history from patient or surrogate, ordering and performing treatments and interventions, ordering and review of laboratory studies, ordering and review of radiographic studies, pulse oximetry and re-evaluation of patient's condition.                            Medical Decision Making Amount and/or Complexity of Data Reviewed Labs: ordered. Radiology: ordered.  Risk Prescription drug management. Decision regarding hospitalization.   This patient presents to the ED for concern of confusion and weakness, this involves an extensive number of treatment options, and is a complaint that carries with it a high risk of complications and morbidity.  The differential diagnosis includes sepsis   Co morbidities that complicate the patient evaluation  Hypertension and diabetes and lung cancer   Additional history obtained:  Additional history obtained from nursing home and EMS External  records from outside source obtained and reviewed including hospital  records   Lab Tests:  I Ordered, and personally interpreted labs.  The pertinent results include: Potassium 2.7, hemoglobin 9.7   Imaging Studies ordered:  I ordered imaging studies including x-ray right hip shows possible femoral neck fracture, chest x-ray shows right upper lobe infiltrate I independently visualized and interpreted imaging which showed right upper lobe infiltrate I agree with the radiologist interpretation   Cardiac Monitoring: / EKG:  The patient was maintained on a cardiac monitor.  I personally viewed and interpreted the cardiac monitored which showed an underlying rhythm of: Normal sinus rhythm   Consultations Obtained:  I requested consultation with the hospitalist and intensivist,  and discussed lab and imaging findings as well as pertinent plan - they recommend: Admit to hospitalist with intensivist consult   Problem List / ED Course / Critical interventions / Medication management  Sepsis and hypothermia I ordered medication including saline and antibiotics and warming blanket Reevaluation of the patient after these medicines showed that the patient stayed the same I have reviewed the patients home medicines and have made adjustments as needed   Social Determinants of Health:  Nursing home patient   Test / Admission - Considered:  None   Patient with hypothermia and sepsis.  She is admitted to medicine with pulmonary consult        Final Clinical Impression(s) / ED Diagnoses Final diagnoses:  Hypothermia, initial encounter    Rx / DC Orders ED Discharge Orders     None         Milton Ferguson, MD 04/24/22 1641

## 2022-04-24 NOTE — H&P (Signed)
Patient Demographics:    [name], is a 72 y.o. female  MRN: [id number]   DOB - 10-03-1950  Admit Date - 04/24/2022  Outpatient Primary MD for the patient is [place]:   Assessment and Plan:  1) sepsis secondary to presumed right-sided pneumonia and UTI---- WBC is 2.9-- WBC previously normal -Patient with hypothermia and altered mentation -Treat empirically with IV fluids, IV Vanco and cefepime pending further culture data  2) acute metabolic encephalopathy--- due to #1 above Additional history obtained from state appointed legal guardian at bedside [name] -Patient apparently is usually conversational and interactive ambulates short distances with a walker long distances with wheelchair until recently, -Treat #1 above  3) pancytopenia--- longstanding history of chronic anemia and chronic thrombocytopenia in bed leukopenia is new most likely due to sepsis as above #1 -Patient recently got iron infusions around 03/25/2022, she was scheduled to start getting Aranesp shots soon -- hemoglobin is 9.7 hematocrit is 30.5 and platelets are 79 (platelets were 145 on 04/09/2022) -Hgb was 7.9 on 03/25/2022 and patient received iron infusion at the time -Serum iron is 91 -Folate is low at 4.3 -Previously on B12 replacements -Start folic acid  4) hypokalemia/hyponatremia/hypochloremia/hypomagnesemia-- -Potassium is 2.7 sodium is 131 chloride is 88, -Mag 1.4 -No vomiting or diarrhea -Replace and recheck  5) hypothyroidism---TSH is 12.95 -May be contributing to hypothermia -Check T3 and T4 -Start levothyroxine  6)CKD stage -3A   --- -Creatinine is 1.70 which is close to her baseline -renally adjust medications, avoid nephrotoxic agents / dehydration  / hypotension  7)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 Delton Coombes  8) history of seizures--- no recent seizures reported - continue Dilantin, check serum Dilantin level  9) bradycardia----suspect related to hypothermia and metoprolol use -Hold metoprolol -Use warming blankets  10)DM2- Use Novolog/Humalog Sliding scale insulin with Accu-Cheks/Fingersticks as ordered   11)Social/Ethics-discussed with state appointed legal guardian [name] -Full code for now to be addressed with his supervisor later  Status is: Inpatient  Remains inpatient appropriate because:   Dispo: The patient is from: SNF              Anticipated d/c is to: SNF              Anticipated d/c date is: 3 days              Patient currently is not medically stable to d/c. Barriers: Not Clinically Stable-    With History of - Reviewed by me  Past Medical History:  Diagnosis Date   Arthritis    Asthma    Cancer ([place])    Rectal    Chronic renal disease, stage 3, moderately decreased glomerular filtration rate between 30-59 mL/min/1.73 square meter ([place]) 04/27/2014   Depression    Diabetes mellitus  years   GERD (gastroesophageal reflux disease)    Gout    History of recurrent UTIs    Hypertension    Iron deficiency 04/27/2014   Lung cancer ([place])    Mental retardation    stopped school at 9th grade    Numbness and tingling in hands    x several months    Rectal cancer ([place])    Seizures ([place])    more than 4 yrs since last seizure. UNknown etiology   Shortness of breath    with exertion   Vitamin B 12 deficiency 02/01/2015   Incidentally found without antibody testing.        Past Surgical History:  Procedure Laterality Date   ABDOMINAL HYSTERECTOMY     ABDOMINAL PERINEAL BOWEL RESECTION N/A 07/26/2013   Procedure:  ABDOMINAL PERINEAL RESECTION;  Surgeon: Jamesetta So, MD;  Location: [facility];  Service: General;  Laterality: N/A;   CATARACT EXTRACTION W/PHACO Left 11/12/2021   Procedure: CATARACT EXTRACTION PHACO AND INTRAOCULAR LENS PLACEMENT ([place]);  Surgeon: Baruch Goldmann, MD;  Location: [facility];  Service: Ophthalmology;  Laterality: Left;  CDE 6.24   CATARACT EXTRACTION W/PHACO Right 11/26/2021   Procedure: CATARACT EXTRACTION PHACO AND INTRAOCULAR LENS PLACEMENT ([facility]);  Surgeon: Baruch Goldmann, MD;  Location: [facility];  Service: Ophthalmology;  Laterality: Right;  CDE:8.67   COLON SURGERY  2015   bowel resection/w colostomy   COLONOSCOPY N/A 02/04/2013   Procedure: COLONOSCOPY;  Surgeon: Rogene Houston, MD;  [facility];  Service: Endoscopy;  Laterality: N/A;  225   COLONOSCOPY N/A 05/12/2014   Procedure: COLONOSCOPY;  Surgeon: Rogene Houston, MD;  [facility];  Service: Endoscopy;  Laterality: N/A;  1030   COLONOSCOPY WITH ESOPHAGOGASTRODUODENOSCOPY (EGD) N/A 01/14/2013   Procedure: COLONOSCOPY WITH ESOPHAGOGASTRODUODENOSCOPY (EGD);  Surgeon: Rogene Houston, MD;  [facility];  Service: Endoscopy;  Laterality: N/A;  250-moved to 315 Ann to notify pt   COLOSTOMY Left 07/26/2013   Procedure: COLOSTOMY;  Surgeon: Jamesetta So, MD;  [facility];  Service: General;  Laterality: Left;   EUS N/A 02/18/2013   Procedure: LOWER ENDOSCOPIC ULTRASOUND (EUS);  Surgeon: Milus Banister, MD;  Location: [facility];  Service: Endoscopy;  Laterality: N/A;   FLEXIBLE SIGMOIDOSCOPY N/A 07/26/2013   Procedure: FLEXIBLE SIGMOIDOSCOPY;  Surgeon: Jamesetta So, MD;  [facility]: [facility];  Service: General;  Laterality: N/A;   LYMPH NODE DISSECTION Left 10/26/2014   Procedure: LYMPH NODE DISSECTION;  Surgeon: [name], MD;  Location: [facility];  Service: Thoracic;  Laterality: Left;   MULTIPLE EXTRACTIONS WITH ALVEOLOPLASTY N/A 11/23/2012   Procedure: MULTIPLE EXTRACION 1, 2, 4, 5, 6, 7, 8, 9, 10, 11, 12, 13, 14, 17, 18, 20, 23,  24, 25, 26, 28, 29, 32 WITH ALVEOLOPLASTY, REMOVE BILATERAL TORI;  Surgeon: Gae Bon, DDS;  Location: [facility];  Service: Oral Surgery;  Laterality: N/A;   PORTACATH PLACEMENT Left 01/02/2015   Procedure: INSERTION PORT-A-CATH;  Surgeon: Aviva Signs, MD;  [facility];  Service: General;  Laterality: Left;   SALPINGOOPHORECTOMY Bilateral 07/26/2013   Procedure: SALPINGO OOPHORECTOMY;  Surgeon: Jamesetta So, MD;  [facility];  Service: General;  Laterality: Bilateral;   SEGMENTECOMY Left 10/26/2014   Procedure: LEFT LOWER LOBE SUPERIOR SEGMENTECTOMY;  Surgeon: [name], MD;  Location: [facility];  Service: Thoracic;  Laterality: Left;   SUPRACERVICAL ABDOMINAL HYSTERECTOMY N/A 07/26/2013   Procedure: HYSTERECTOMY SUPRACERVICAL ABDOMINAL ;  Surgeon: Jamesetta So, MD;  [facility];  Service: General;  Laterality: N/A;   VIDEO ASSISTED THORACOSCOPY Left 10/26/2014   Procedure: LEFT VIDEO ASSISTED THORACOSCOPY;  Surgeon: [name], MD;  Location: [facility];  Service: Thoracic;  Laterality: Left;   Chief Complaint  Patient presents with   Altered Mental Status      HPI:    [name]  is a 73 y.o. 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, status post prior chemo and radiation therapy, CKD 3A, chronic anemia,  chronic thrombocytopenia, history of seizures, hypertension, DM, obesity and B12 deficiency who presents to the ED from [facility] SN[facility] facility with hypothermia and decreased responsiveness--- in the ED patient is found to be hypothermic with Temperature of 27 F -Required Bair hugger -Additional history obtained from state appointed legal guardian at bedside [name] -Patient apparently is usually conversational and interactive ambulates short distances with a walker long distances with wheelchair until recently,  -Chest x-ray in the ED suggested right-sided pneumonia -UA suggest possible  UTI -TSH is 12.95 -Potassium is 2.7 sodium is 131 chloride is 88, -Creatinine is 1.70 which is close to her baseline -WBC is 2.9 hemoglobin is 9.7 hematocrit is 30.5 and platelets are 79 (platelets were 145 04/09/2022) -Hgb was 7.9 on 03/25/2022 and patient received iron infusion at the time -Serum iron is 91 -Folate is low at 4.3 -Mag 1.4    Review of systems:    In addition to the HPI above,   A full Review of  Systems was done, all other systems reviewed are negative except as noted above in HPI , .    Social History:  Reviewed by me    Social History   Tobacco Use   Smoking status: Former    Types: Cigarettes    Quit date: 07/16/2008    Years since quitting: 13.7   Smokeless tobacco: Never   Tobacco comments:    unknown when stopped smoking      long time  Substance Use Topics   Alcohol use: No     Family History :  Reviewed by me    Family History  Family history unknown: Yes   HTN   Home Medications:   Prior to Admission medications   Medication Sig Start Date End Date Taking? Authorizing Provider  allopurinol (ZYLOPRIM) 300 MG tablet Take 200 mg by mouth daily. 12/28/20  Yes [provider]  amLODipine (NORVASC) 5 MG tablet Take 5 mg by mouth daily. 06/23/19  Yes [provider]  ascorbic acid (VITAMIN C) 500 MG tablet Take 500 mg by mouth daily.   Yes [provider]  aspirin EC 81 MG tablet Take 81 mg by mouth daily.   Yes [provider]  calcium carbonate (TUMS) 500 MG chewable tablet Chew 2 tablets (400 mg of elemental calcium total) by mouth 3 (three) times daily. Patient taking differently: Chew 2 tablets by mouth 2 (two) times daily. 12/02/16  Yes Twana First, MD  Cholecalciferol 25 MCG (1000 UT) TBDP Take 1 tablet by mouth daily.   Yes [provider]  diclofenac Sodium (VOLTAREN) 1 % GEL Apply 2 g topically 4 (four) times daily.   Yes [provider]  docusate sodium (COLACE) 100 MG capsule Take  100 mg by mouth 2 (two) times daily.   Yes [provider]  donepezil (ARICEPT) 5 MG tablet Take 10 mg by mouth daily. 06/01/20  Yes [provider]  escitalopram (LEXAPRO) 10 MG tablet Take 10 mg by mouth daily. 08/30/21  Yes [provider]  ferrous sulfate 325 (65 FE) MG tablet Take 325 mg by mouth 2 (two) times daily with a meal.   Yes [provider]  fish oil-omega-3 fatty acids 1000 MG capsule Take 1 g by mouth 2 (two) times daily.   Yes [provider]  Fluticasone-Salmeterol (ADVAIR) 250-50 MCG/DOSE AEPB Inhale 1 puff into the lungs every 12 (twelve) hours.   Yes [provider]  furosemide (LASIX) 40 MG tablet Take 40 mg by mouth 2 (two) times daily. 09/01/19  Yes [provider]  insulin lispro (HUMALOG) 100 UNIT/ML injection Inject 1-5 Units into the skin 3 (three) times daily before meals.   Yes [provider]  metoprolol (TOPROL-XL) 200 MG 24 hr tablet Take 200 mg by mouth in the morning and at bedtime. 09/30/17  Yes [provider]  montelukast (SINGULAIR) 10 MG tablet Take 10 mg by mouth at bedtime.   Yes [provider]  pantoprazole (PROTONIX) 40 MG tablet Take 40 mg by mouth every morning.    Yes [provider]  phenytoin (DILANTIN) 100 MG ER capsule Take 100 mg by mouth daily.   Yes [provider]  pravastatin (PRAVACHOL) 40 MG tablet Take 40 mg by mouth every evening.    Yes [provider]  traZODone (DESYREL) 50 MG tablet Take 50 mg by mouth at bedtime as needed for sleep. 01/26/18  Yes [provider]  [place] test strip  12/22/17   [provider]  Lancets Misc. (UNISTIK 3 COMFORT) [place]  06/15/20   [provider]  metFORMIN (GLUCOPHAGE) 1000 MG tablet Take by mouth. Patient not taking: Reported on 04/24/2022 01/26/18   [provider]  NOVOFINE AUTOCOVER 30G X 8 MM MISC USE ONCE DAILY WITHOINSULIN PEN(S). 05/07/19    [provider]  OS-CAL CALCIUM + D3 500-200 MG-UNIT TABS TAKE 2 TABLETS BY MOUTH DAILY WITH BREAKFAST. Patient not taking: Reported on 04/24/2022 07/29/17   Holley Bouche, NP     Allergies:    No Known Allergies   Physical Exam:   Vitals  Blood pressure (!) 106/54, pulse (!) 50, temperature (!) 93.6 F (34.2 C), temperature source Bladder, resp. rate 16, height 5\' 6"  (1.676 m), weight 106.8 kg, SpO2 94 %.  Physical Examination: General appearance - alert,  in no distress  Mental status -somewhat sleepy and disoriented Eyes - sclera anicteric Neck - supple, no JVD elevation , Chest - clear  to auscultation bilaterally, symmetrical air movement,  Heart - S1 and S2 normal, regular , bradycardic Abdomen - soft, nontender, nondistended, +BS Neurological - -sleepy and disoriented, appears to be moving all extremities well spontaneously Extremities - +ve pedal edema noted, intact peripheral pulses  Skin - warm, dry     Data Review:    CBC Recent Labs  Lab 04/24/22 1135  WBC 2.9*  HGB 9.7*  HCT 30.5*  PLT 79*  MCV 86.2  MCH 27.4  MCHC 31.8  RDW 17.9*  LYMPHSABS 0.3*  MONOABS 0.2  EOSABS 0.1  BASOSABS 0.0   ------------------------------------------------------------------------------------------------------------------  Chemistries  Recent Labs  Lab 04/24/22 1135 04/24/22 1241  NA 131*  --   K 2.7*  --   CL 88*  --   CO2 31  --   GLUCOSE 111*  --   BUN 41*  --   CREATININE 1.70*  --  CALCIUM 8.3*  --   MG  --  1.4*  AST 22  --   ALT 19  --   ALKPHOS 293*  --   BILITOT 0.9  --    ------------------------------------------------------------------------------------------------------------------ estimated creatinine clearance is 38.1 mL/min (A) (by C-G formula based on SCr of 1.7 mg/dL (H)). ------------------------------------------------------------------------------------------------------------------ Recent Labs    04/24/22 1252  TSH  12.952*     Coagulation profile Recent Labs  Lab 04/24/22 1135  INR 1.0   --------------------------------------------------------------------------------------------------------------  Urinalysis    Component Value Date/Time   COLORURINE STRAW (A) 04/24/2022 1150   APPEARANCEUR CLEAR 04/24/2022 1150   LABSPEC 1.006 04/24/2022 1150   PHURINE 5.0 04/24/2022 1150   GLUCOSEU NEGATIVE 04/24/2022 1150   HGBUR NEGATIVE 04/24/2022 1150   BILIRUBINUR NEGATIVE 04/24/2022 1150   KETONESUR NEGATIVE 04/24/2022 1150   PROTEINUR NEGATIVE 04/24/2022 1150   UROBILINOGEN 0.2 01/18/2015 1111   NITRITE NEGATIVE 04/24/2022 1150   LEUKOCYTESUR TRACE (A) 04/24/2022 1150    ----------------------------------------------------------------------------------------------------------------   Imaging Results:    CT Hip Right Wo Contrast  Result Date: 04/24/2022 CLINICAL DATA:  Metastatic rectal adenocarcinoma, hip trauma. Renal insufficiency. Radiation for rectal cancer. EXAM: CT OF THE RIGHT HIP WITHOUT CONTRAST TECHNIQUE: Multidetector CT imaging of the right hip was performed according to the standard protocol. Multiplanar CT image reconstructions were also generated. RADIATION DOSE REDUCTION: This exam was performed according to the departmental dose-optimization program which includes automated exposure control, adjustment of the mA and/or kV according to patient size and/or use of iterative reconstruction technique. COMPARISON:  CT pelvis 07/17/2021 and hip radiographs 04/24/2022 FINDINGS: Bones/Joint/Cartilage No discrete femoral neck fracture is readily apparent. There is spurring along the femoral head contributing to the recent appearance at conventional radiography. Widespread scattered sclerosis and lucency in the bony pelvis, this is been present on numerous prior exams, some of this may reflect response to radiation therapy there is also suspected underlying diffuse idiopathic skeletal  hyperostosis and possible renal osteodystrophy contributing. Paget's disease has been raised as a possibility for some of the widespread skeletal appearance, however the widespread distribution would be somewhat unusual for Paget's disease. In any case there is no substantial change on today's exam compared 07/17/2021 to indicate an acute process. Chronically fragmented anterior acetabular osteophytes noted. No hip effusion. Ligaments Suboptimally assessed by CT. Muscles and Tendons Calcific tendinopathy along the proximal hamstring tendons. Soft tissues Foley catheter in the urinary bladder. Continued soft tissue density in the vicinity of the rectum or rectal pouch, some of this may be treatment related in this is only partially included on today's exam. IMPRESSION: 1. No femoral neck fracture is readily apparent. There is spurring along the femoral head contributing to the recent appearance at conventional radiography. 2. Widespread scattered sclerosis and lucency in the bony pelvis, this is been present on numerous prior exams, some of this may reflect response to radiation therapy. There is also suspected underlying diffuse idiopathic skeletal hyperostosis and possible renal osteodystrophy contributing. Paget's disease has been raised as a possibility for some of the widespread skeletal appearance, however the widespread distribution would be somewhat unusual for Paget's disease. In any case there is no substantial change in the right femur or right hemipelvis on today's exam compared to 07/17/2021 to indicate an acute process. 3. Calcific tendinopathy along the proximal hamstring tendons. 4. Continued soft tissue density in the vicinity of the rectum or rectal pouch, some of this may be treatment related in this is only partially included on today's exam.  Electronically Signed   By: Van Clines M.D.   On: 04/24/2022 17:45   CT Head Wo Contrast  Result Date: 04/24/2022 CLINICAL DATA:  Head trauma.   Intracranial injury suspected. EXAM: CT HEAD WITHOUT CONTRAST TECHNIQUE: Contiguous axial images were obtained from the base of the skull through the vertex without intravenous contrast. RADIATION DOSE REDUCTION: This exam was performed according to the departmental dose-optimization program which includes automated exposure control, adjustment of the mA and/or kV according to patient size and/or use of iterative reconstruction technique. COMPARISON:  MRI 11/19/2005 FINDINGS: Brain: Age related atrophy. No evidence of old or acute focal infarction, mass lesion, hemorrhage, hydrocephalus or extra-axial collection. Vascular: No abnormal vascular finding. Skull: Normal Sinuses/Orbits: Clear/normal Other: None IMPRESSION: No acute or traumatic finding. Age related atrophy. Electronically Signed   By: Nelson Chimes M.D.   On: 04/24/2022 17:39   DG Hip Unilat W or Wo Pelvis 2-3 Views Right  Result Date: 04/24/2022 CLINICAL DATA:  Fall EXAM: DG HIP (WITH OR WITHOUT PELVIS) 2-3V RIGHT COMPARISON:  07/17/2021 FINDINGS: Suspect acute nondisplaced fracture of the right femoral neck. Bilateral hip joints intact without dislocation. 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 Degenerative changes of both SI joints. No pelvic diastasis. IMPRESSION: 1. Suspect nondisplaced right femoral neck fracture. Further evaluation can be obtained with CT. 2. 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 Electronically Signed   By: Davina Poke D.O.   On: 04/24/2022 13:32   DG Shoulder Left  Result Date: 04/24/2022 CLINICAL DATA:  Fall, fever EXAM: LEFT SHOULDER - 2+ VIEW COMPARISON:  11/30/2017 FINDINGS: Osseous demineralization. AC joint alignment normal. Mild glenohumeral degenerative changes. Humerus approximates the undersurface of the acromion consistent with chronic rotator cuff tear. Small calcified nodule identified adjacent to humeral head, suspect rotator cuff  calcification. No acute fracture, dislocation, or bone destruction. Visualized ribs intact. IMPRESSION: LEFT glenohumeral degenerative changes and evidence of chronic rotator cuff tear. Suspected calcific tendinitis of the rotator cuff. No acute osseous abnormalities. Electronically Signed   By: Lavonia Dana M.D.   On: 04/24/2022 13:25   DG Chest Port 1 View  Result Date: 04/24/2022 CLINICAL DATA:  Pneumonia, shortness of breath, treated with antibiotics, urinary burning began yesterday, diagnosed with UTI and started on antibiotics EXAM: PORTABLE CHEST 1 VIEW COMPARISON:  Portable exam 1059 hours compared to 06/03/2018 FINDINGS: LEFT subclavian Port-A-Cath with tip projecting over LEFT brachiocephalic vein. Normal heart size and mediastinal contours. Small to moderate RIGHT pleural effusion and basilar atelectasis with underlying eventration of RIGHT diaphragm noted. Small LEFT basilar effusion. Patchy RIGHT upper lobe infiltrate. No pneumothorax or acute osseous findings. IMPRESSION: Bibasilar effusions and atelectasis greater on RIGHT. Patchy RIGHT upper lobe infiltrate. Electronically Signed   By: Lavonia Dana M.D.   On: 04/24/2022 11:08    Radiological Exams on Admission: CT Hip Right Wo Contrast  Result Date: 04/24/2022 CLINICAL DATA:  Metastatic rectal adenocarcinoma, hip trauma. Renal insufficiency. Radiation for rectal cancer. EXAM: CT OF THE RIGHT HIP WITHOUT CONTRAST TECHNIQUE: Multidetector CT imaging of the right hip was performed according to the standard protocol. Multiplanar CT image reconstructions were also generated. RADIATION DOSE REDUCTION: This exam was performed according to the departmental dose-optimization program which includes automated exposure control, adjustment of the mA and/or kV according to patient size and/or use of iterative reconstruction technique. COMPARISON:  CT pelvis 07/17/2021 and hip radiographs 04/24/2022 FINDINGS: Bones/Joint/Cartilage No discrete femoral neck  fracture is readily apparent.  There is spurring along the femoral head contributing to the recent appearance at conventional radiography. Widespread scattered sclerosis and lucency in the bony pelvis, this is been present on numerous prior exams, some of this may reflect response to radiation therapy there is also suspected underlying diffuse idiopathic skeletal hyperostosis and possible renal osteodystrophy contributing. Paget's disease has been raised as a possibility for some of the widespread skeletal appearance, however the widespread distribution would be somewhat unusual for Paget's disease. In any case there is no substantial change on today's exam compared 07/17/2021 to indicate an acute process. Chronically fragmented anterior acetabular osteophytes noted. No hip effusion. Ligaments Suboptimally assessed by CT. Muscles and Tendons Calcific tendinopathy along the proximal hamstring tendons. Soft tissues Foley catheter in the urinary bladder. Continued soft tissue density in the vicinity of the rectum or rectal pouch, some of this may be treatment related in this is only partially included on today's exam. IMPRESSION: 1. No femoral neck fracture is readily apparent. There is spurring along the femoral head contributing to the recent appearance at conventional radiography. 2. Widespread scattered sclerosis and lucency in the bony pelvis, this is been present on numerous prior exams, some of this may reflect response to radiation therapy. There is also suspected underlying diffuse idiopathic skeletal hyperostosis and possible renal osteodystrophy contributing. Paget's disease has been raised as a possibility for some of the widespread skeletal appearance, however the widespread distribution would be somewhat unusual for Paget's disease. In any case there is no substantial change in the right femur or right hemipelvis on today's exam compared to 07/17/2021 to indicate an acute process. 3. Calcific tendinopathy  along the proximal hamstring tendons. 4. Continued soft tissue density in the vicinity of the rectum or rectal pouch, some of this may be treatment related in this is only partially included on today's exam. Electronically Signed   By: Van Clines M.D.   On: 04/24/2022 17:45   CT Head Wo Contrast  Result Date: 04/24/2022 CLINICAL DATA:  Head trauma.  Intracranial injury suspected. EXAM: CT HEAD WITHOUT CONTRAST TECHNIQUE: Contiguous axial images were obtained from the base of the skull through the vertex without intravenous contrast. RADIATION DOSE REDUCTION: This exam was performed according to the departmental dose-optimization program which includes automated exposure control, adjustment of the mA and/or kV according to patient size and/or use of iterative reconstruction technique. COMPARISON:  MRI 11/19/2005 FINDINGS: Brain: Age related atrophy. No evidence of old or acute focal infarction, mass lesion, hemorrhage, hydrocephalus or extra-axial collection. Vascular: No abnormal vascular finding. Skull: Normal Sinuses/Orbits: Clear/normal Other: None IMPRESSION: No acute or traumatic finding. Age related atrophy. Electronically Signed   By: Nelson Chimes M.D.   On: 04/24/2022 17:39   DG Hip Unilat W or Wo Pelvis 2-3 Views Right  Result Date: 04/24/2022 CLINICAL DATA:  Fall EXAM: DG HIP (WITH OR WITHOUT PELVIS) 2-3V RIGHT COMPARISON:  07/17/2021 FINDINGS: Suspect acute nondisplaced fracture of the right femoral neck. Bilateral hip joints intact without dislocation. 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 Degenerative changes of both SI joints. No pelvic diastasis. IMPRESSION: 1. Suspect nondisplaced right femoral neck fracture. Further evaluation can be obtained with CT. 2. 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 Electronically Signed   By: Davina Poke D.O.   On: 04/24/2022 13:32   DG Shoulder Left  Result Date: 04/24/2022 CLINICAL  DATA:  Fall, fever EXAM: LEFT SHOULDER - 2+ VIEW COMPARISON:  11/30/2017 FINDINGS: Osseous demineralization.  AC joint alignment normal. Mild glenohumeral degenerative changes. Humerus approximates the undersurface of the acromion consistent with chronic rotator cuff tear. Small calcified nodule identified adjacent to humeral head, suspect rotator cuff calcification. No acute fracture, dislocation, or bone destruction. Visualized ribs intact. IMPRESSION: LEFT glenohumeral degenerative changes and evidence of chronic rotator cuff tear. Suspected calcific tendinitis of the rotator cuff. No acute osseous abnormalities. Electronically Signed   By: Lavonia Dana M.D.   On: 04/24/2022 13:25   DG Chest Port 1 View  Result Date: 04/24/2022 CLINICAL DATA:  Pneumonia, shortness of breath, treated with antibiotics, urinary burning began yesterday, diagnosed with UTI and started on antibiotics EXAM: PORTABLE CHEST 1 VIEW COMPARISON:  Portable exam 1059 hours compared to 06/03/2018 FINDINGS: LEFT subclavian Port-A-Cath with tip projecting over LEFT brachiocephalic vein. Normal heart size and mediastinal contours. Small to moderate RIGHT pleural effusion and basilar atelectasis with underlying eventration of RIGHT diaphragm noted. Small LEFT basilar effusion. Patchy RIGHT upper lobe infiltrate. No pneumothorax or acute osseous findings. IMPRESSION: Bibasilar effusions and atelectasis greater on RIGHT. Patchy RIGHT upper lobe infiltrate. Electronically Signed   By: Lavonia Dana M.D.   On: 04/24/2022 11:08    DVT Prophylaxis -SCD   AM Labs Ordered, also please review Full Orders  Family Communication: Admission, patients condition and plan of care including tests being ordered have been discussed with the patient and Mr Chance who indicate understanding and agree with the plan   Condition  -gaurded  Roxan Hockey M.D on 04/24/2022 at 7:11 PM Go to [email or web address] -  for contact info  [facility] - Office   [phone]

## 2022-04-25 DIAGNOSIS — I9589 Other hypotension: Secondary | ICD-10-CM

## 2022-04-25 DIAGNOSIS — T68XXXA Hypothermia, initial encounter: Secondary | ICD-10-CM | POA: Diagnosis not present

## 2022-04-25 DIAGNOSIS — R6521 Severe sepsis with septic shock: Secondary | ICD-10-CM | POA: Diagnosis not present

## 2022-04-25 DIAGNOSIS — N390 Urinary tract infection, site not specified: Secondary | ICD-10-CM | POA: Diagnosis not present

## 2022-04-25 DIAGNOSIS — A419 Sepsis, unspecified organism: Secondary | ICD-10-CM | POA: Diagnosis not present

## 2022-04-25 LAB — CBC
HCT: 24.1 % — ABNORMAL LOW (ref 36.0–46.0)
Hemoglobin: 7.7 g/dL — ABNORMAL LOW (ref 12.0–15.0)
MCH: 27.8 pg (ref 26.0–34.0)
MCHC: 32 g/dL (ref 30.0–36.0)
MCV: 87 fL (ref 80.0–100.0)
Platelets: 75 10*3/uL — ABNORMAL LOW (ref 150–400)
RBC: 2.77 MIL/uL — ABNORMAL LOW (ref 3.87–5.11)
RDW: 18 % — ABNORMAL HIGH (ref 11.5–15.5)
WBC: 4.3 10*3/uL (ref 4.0–10.5)
nRBC: 1.4 % — ABNORMAL HIGH (ref 0.0–0.2)

## 2022-04-25 LAB — MRSA NEXT GEN BY PCR, NASAL: MRSA by PCR Next Gen: DETECTED — AB

## 2022-04-25 LAB — BASIC METABOLIC PANEL
Anion gap: 13 (ref 5–15)
BUN: 39 mg/dL — ABNORMAL HIGH (ref 8–23)
CO2: 29 mmol/L (ref 22–32)
Calcium: 8.1 mg/dL — ABNORMAL LOW (ref 8.9–10.3)
Chloride: 92 mmol/L — ABNORMAL LOW (ref 98–111)
Creatinine, Ser: 1.86 mg/dL — ABNORMAL HIGH (ref 0.44–1.00)
GFR, Estimated: 29 mL/min — ABNORMAL LOW (ref >60)
Glucose, Bld: 85 mg/dL (ref 70–99)
Potassium: 3.9 mmol/L (ref 3.5–5.1)
Sodium: 134 mmol/L — ABNORMAL LOW (ref 135–145)

## 2022-04-25 LAB — VANCOMYCIN, RANDOM: Vancomycin Rm: 20 ug/mL

## 2022-04-25 LAB — GLUCOSE, CAPILLARY
Glucose-Capillary: 89 mg/dL (ref 70–99)
Glucose-Capillary: 99 mg/dL (ref 70–99)
Glucose-Capillary: 103 mg/dL — ABNORMAL HIGH (ref 70–99)
Glucose-Capillary: 150 mg/dL — ABNORMAL HIGH (ref 70–99)

## 2022-04-25 LAB — URINE CULTURE: Culture: NO GROWTH

## 2022-04-25 LAB — PHENYTOIN LEVEL, TOTAL: Phenytoin Lvl: 28.1 ug/mL — ABNORMAL HIGH (ref 10.0–20.0)

## 2022-04-25 LAB — HIV ANTIBODY (ROUTINE TESTING W REFLEX): HIV Screen 4th Generation wRfx: NONREACTIVE

## 2022-04-25 LAB — T4, FREE: Free T4: 0.69 ng/dL (ref 0.61–1.12)

## 2022-04-25 MED ORDER — DEXTROSE-NACL 5-0.9 % IV SOLN
INTRAVENOUS | Status: DC
  Administered 2022-04-26: 150 mL/h via INTRAVENOUS

## 2022-04-25 MED ORDER — ALBUMIN HUMAN 25 % IV SOLN
25.0000 g | Freq: Once | INTRAVENOUS | Status: AC
  Administered 2022-04-25: 25 g via INTRAVENOUS
  Filled 2022-04-25: qty 100

## 2022-04-25 MED ORDER — MUPIROCIN 2 % EX OINT
1.0000 | TOPICAL_OINTMENT | Freq: Two times a day (BID) | CUTANEOUS | Status: DC
  Administered 2022-04-25 – 2022-04-30 (×11): 1 via NASAL
  Filled 2022-04-25 (×2): qty 22

## 2022-04-25 MED ORDER — PHENYTOIN SODIUM EXTENDED 30 MG PO CAPS
60.0000 mg | ORAL_CAPSULE | Freq: Every day | ORAL | Status: DC
  Filled 2022-04-25 (×3): qty 2

## 2022-04-25 NOTE — Evaluation (Signed)
Clinical/Bedside Swallow Evaluation Patient Details  Name: [name] MRN: [id number] Date of Birth: [birth year]  Today's Date: 04/25/2022 Time: SLP Start Time (ACUTE ONLY): 20 SLP Stop Time (ACUTE ONLY): 1654 SLP Time Calculation (min) (ACUTE ONLY): 24 min  Past Medical History:  Past Medical History:  Diagnosis Date   Arthritis    Asthma    Cancer ([place])    Rectal    Chronic renal disease, stage 3, moderately decreased glomerular filtration rate between 30-59 mL/min/1.73 square meter ([place]) 04/27/2014   Depression    Diabetes mellitus    years   GERD (gastroesophageal reflux disease)    Gout    History of recurrent UTIs    Hypertension    Iron deficiency 04/27/2014   Lung cancer ([place])    Mental retardation    stopped school at 9th grade    Numbness and tingling in hands    x several months    Rectal cancer ([place])    Seizures ([place])    more than 4 yrs since last seizure. UNknown etiology   Shortness of breath    with exertion   Vitamin B 12 deficiency 02/01/2015   Incidentally found without antibody testing.     Past Surgical History:  Past Surgical History:  Procedure Laterality Date   ABDOMINAL HYSTERECTOMY     ABDOMINAL PERINEAL BOWEL RESECTION N/A 07/26/2013   Procedure:  ABDOMINAL PERINEAL RESECTION;  Surgeon: Jamesetta So, MD;  Location: [facility];  Service: General;  Laterality: N/A;   CATARACT EXTRACTION W/PHACO Left 11/12/2021   Procedure: CATARACT EXTRACTION PHACO AND INTRAOCULAR LENS PLACEMENT ([place]);  Surgeon: Baruch Goldmann, MD;  Location: [facility];  Service: Ophthalmology;  Laterality: Left;  CDE 6.24   CATARACT EXTRACTION W/PHACO Right 11/26/2021   Procedure: CATARACT EXTRACTION PHACO AND INTRAOCULAR LENS PLACEMENT (I[facility]);  Surgeon: Baruch Goldmann, MD;  Location: [facility];  Service: Ophthalmology;  Laterality: Right;  CDE:8.67   COLON SURGERY  2015   bowel resection/w colostomy   COLONOSCOPY N/A 02/04/2013   Procedure: COLONOSCOPY;  Surgeon: Rogene Houston,  MD;  [facility];  Service: Endoscopy;  Laterality: N/A;  225   COLONOSCOPY N/A 05/12/2014   Procedure: COLONOSCOPY;  Surgeon: Rogene Houston, MD;  [facility];  Service: Endoscopy;  Laterality: N/A;  1030   COLONOSCOPY WITH ESOPHAGOGASTRODUODENOSCOPY (EGD) N/A 01/14/2013   Procedure: COLONOSCOPY WITH ESOPHAGOGASTRODUODENOSCOPY (EGD);  Surgeon: Rogene Houston, MD;  [facility];  Service: Endoscopy;  Laterality: N/A;  250-moved to 315 Ann to notify pt   COLOSTOMY Left 07/26/2013   Procedure: COLOSTOMY;  Surgeon: Jamesetta So, MD;  Location: [facility];  Service: General;  Laterality: Left;   EUS N/A 02/18/2013   Procedure: LOWER ENDOSCOPIC ULTRASOUND (EUS);  Surgeon: Milus Banister, MD;  Location: [facility];  Service: Endoscopy;  Laterality: N/A;   FLEXIBLE SIGMOIDOSCOPY N/A 07/26/2013   Procedure: FLEXIBLE SIGMOIDOSCOPY;  Surgeon: Jamesetta So, MD;  Location: [facility];  Service: General;  Laterality: N/A;   LYMPH NODE DISSECTION Left 10/26/2014   Procedure: LYMPH NODE DISSECTION;  Surgeon: [name], MD;  Location: [place];  Service: Thoracic;  Laterality: Left;   MULTIPLE EXTRACTIONS WITH ALVEOLOPLASTY N/A 11/23/2012   Procedure: MULTIPLE EXTRACION 1, 2, 4, 5, 6, 7, 8, 9, 10, 11, 12, 13, 14, 17, 18, 20, 23, 24, 25, 26, 28, 29, 32 WITH ALVEOLOPLASTY, REMOVE BILATERAL TORI;  Surgeon: Gae Bon, DDS;  Location: [place];  Service: Oral Surgery;  Laterality: N/A;   PORTACATH PLACEMENT Left 01/02/2015   Procedure: INSERTION PORT-A-CATH;  Surgeon: Aviva Signs, MD;  Location: [facility];  Service: General;  Laterality: Left;   SALPINGOOPHORECTOMY Bilateral 07/26/2013   Procedure: SALPINGO OOPHORECTOMY;  Surgeon: Jamesetta So, MD;  Location: [facility];  Service: General;  Laterality: Bilateral;   SEGMENTECOMY Left 10/26/2014   Procedure: LEFT LOWER LOBE SUPERIOR SEGMENTECTOMY;  Surgeon: [name], MD;  Location: [place];  Service: Thoracic;  Laterality:  Left;   SUPRACERVICAL ABDOMINAL HYSTERECTOMY N/A 07/26/2013   Procedure: HYSTERECTOMY SUPRACERVICAL ABDOMINAL ;  Surgeon: Jamesetta So, MD;  Location: [facility];  Service: General;  Laterality: N/A;   VIDEO ASSISTED THORACOSCOPY Left 10/26/2014   Procedure: LEFT VIDEO ASSISTED THORACOSCOPY;  Surgeon: [name], MD;  Location: [place];  Service: Thoracic;  Laterality: Left;   HPI:  72 year old woman brought in by EMS from [facility] facility due to hypothermia temperature 92.2 and bradycardia in the 40s.  By report, she was hospitalized for pneumonia at 10 days ago treated with antibiotics, was diagnosed with UTI at the facility 1 day PTA and started on antibiotic.  ED PA obtained history of recent fall and she complained of right hip and left shoulder pain  She was placed on the warmer and temperature improved from 89.1 on arrival to 92.3, remained bradycardic in the 40s, normotensive  Initial labs significant for hyponatremia 131, hypokalemia 2.7, hypomagnesemia 1.4, AKI BUN/creatinine 41/1.7, leukopenia with stable anemia and mild thrombocytopenia  UA showed trace LE with 6-10 white cells and many bacteria  Chest x-ray showed patchy right upper lobe infiltrate with moderate right pleural effusion versus elevated right hemidiaphragm and small left effusion. BSE requested due to nursing reports that Pt is holding food in her mouth.    Assessment / Plan / Recommendation  Clinical Impression  Pt presents with suspected cognitive based dysphagia. She is holding her tongue outside of her oral cavity, but is able to retract and moved in all directions with cues. Limited vocalizations, no discernable words. Pt with reduced labial seal across concistencies and textures, however improved performance with straw sips and puree on a spoon and SLP holding spoon in oral cavity a little longer to encouraged lip closure. Pt required mod/max cues for participation and her eyes shifted side to side with limited  eye contact. Mech soft texture trials had to be removed from oral cavity by SLP. Recommend D1/puree and thin liquids with 100% feeder assist and PO medications crushed in puree but only feed if Pt is alert- meals may need to be held in her current state. SLP will follow in acute stay. Above to RN. SLP Visit Diagnosis: Dysphagia, unspecified (R13.10)    Aspiration Risk  Mild aspiration risk;Risk for inadequate nutrition/hydration    Diet Recommendation Dysphagia 1 (Puree);Thin liquid   Liquid Administration via: Straw Medication Administration: Crushed with puree Supervision: Full supervision/cueing for compensatory strategies;Staff to assist with self feeding Compensations: Slow rate;Small sips/bites;Lingual sweep for clearance of pocketing Postural Changes: Seated upright at 90 degrees;Remain upright for at least 30 minutes after po intake    Other  Recommendations Oral Care Recommendations: Oral care before and after PO;Staff/trained caregiver to provide oral care    Recommendations for follow up therapy are one component of a multi-disciplinary discharge planning process, led by the attending physician.  Recommendations may be updated based on patient status, additional functional criteria and insurance authorization.  Follow up Recommendations Follow physician's recommendations for discharge plan and  follow up therapies      Assistance Recommended at Discharge    Functional Status Assessment Patient has had a recent decline in their functional status and demonstrates the ability to make significant improvements in function in a reasonable and predictable amount of time.  Frequency and Duration min 2x/week  1 week       Prognosis Prognosis for improved oropharyngeal function: Fair Barriers to Reach Goals: Severity of deficits;Behavior      Swallow Study   General Date of Onset: 04/24/22 HPI: 72 year old woman brought in by EMS from [facility] facility due to hypothermia  temperature 92.2 and bradycardia in the 40s.  By report, she was hospitalized for pneumonia at 10 days ago treated with antibiotics, was diagnosed with UTI at the facility 1 day PTA and started on antibiotic.  ED PA obtained history of recent fall and she complained of right hip and left shoulder pain  She was placed on the warmer and temperature improved from 89.1 on arrival to 92.3, remained bradycardic in the 40s, normotensive  Initial labs significant for hyponatremia 131, hypokalemia 2.7, hypomagnesemia 1.4, AKI BUN/creatinine 41/1.7, leukopenia with stable anemia and mild thrombocytopenia  UA showed trace LE with 6-10 white cells and many bacteria  Chest x-ray showed patchy right upper lobe infiltrate with moderate right pleural effusion versus elevated right hemidiaphragm and small left effusion. BSE requested due to nursing reports that Pt is holding food in her mouth. Type of Study: Bedside Swallow Evaluation Previous Swallow Assessment: N/A Diet Prior to this Study: Regular;Thin liquids (Level 0) Temperature Spikes Noted: No Respiratory Status: Room air History of Recent Intubation: No Behavior/Cognition: Alert;Requires cueing;Doesn't follow directions;Confused Oral Cavity Assessment: Within Functional Limits Oral Care Completed by SLP: Recent completion by staff Oral Cavity - Dentition: Poor condition;Missing dentition Vision: Impaired for self-feeding Self-Feeding Abilities: Total assist Patient Positioning: Upright in bed Baseline Vocal Quality: Normal Volitional Cough: Cognitively unable to elicit Volitional Swallow: Able to elicit    Oral/Motor/Sensory Function Overall Oral Motor/Sensory Function: Generalized oral weakness Facial Strength: Reduced right;Reduced left Lingual ROM: Within Functional Limits Lingual Symmetry: Within Functional Limits Lingual Strength: Reduced Lingual Sensation: Reduced Velum:  (could not visualize) Mandible: Within Functional Limits   Ice Chips  Ice chips: Impaired Presentation: Spoon Oral Phase Impairments: Reduced labial seal;Reduced lingual movement/coordination;Poor awareness of bolus Oral Phase Functional Implications: Oral holding;Right anterior spillage;Left anterior spillage Pharyngeal Phase Impairments: Suspected delayed Swallow   Thin Liquid Thin Liquid: Impaired Presentation: Cup;Straw Oral Phase Impairments: Reduced labial seal    Nectar Thick Nectar Thick Liquid: Not tested   Honey Thick Honey Thick Liquid: Not tested   Puree Puree: Impaired Presentation: Spoon Oral Phase Impairments: Reduced labial seal;Reduced lingual movement/coordination Oral Phase Functional Implications: Prolonged oral transit   Solid     Solid: Impaired Presentation: Spoon Oral Phase Impairments: Reduced labial seal;Reduced lingual movement/coordination;Impaired mastication;Poor awareness of bolus Oral Phase Functional Implications: Impaired mastication;Oral holding;Oral residue Pharyngeal Phase Impairments:  (SLP removed fruit)     Thank you,  Genene Churn, [place] 04/25/2022,5:02 PM

## 2022-04-25 NOTE — Progress Notes (Signed)
Pharmacy Antibiotic Note  [name] is a 72 y.o. female admitted on 04/24/2022 with  hypothermia and bradycardia .  Pharmacy has been consulted for vancomycin dosing.  Afebrile, wbc up to 4, scr up 1.86. Cultures drawn in ED are no growth to date.   Random vancomycin level this morning drawn about 18 hours after loading dose was 20 which is at upper end of goal.   Plan: Vancomycin 1250 IV every 24 hours. Goal trough 15-20 mcg/mL. Will use conventional dosing for now Follow renal function closely.  Continue Cefepime 2g q12 hours   Height: 5\' 6"  (167.6 cm) Weight: 108.3 kg (238 lb 12.1 oz) IBW/kg (Calculated) : 59.3  Temp (24hrs), Avg:92.6 F (33.7 C), Min:89.1 F (31.7 C), Max:98.9 F (37.2 C)  Recent Labs  Lab 04/24/22 1135 04/24/22 1241 04/25/22 0455 04/25/22 0840  WBC 2.9*  --  4.3  --   CREATININE 1.70*  --  1.86*  --   LATICACIDVEN 1.0 1.1  --   --   VANCORANDOM  --   --   --  20     Estimated Creatinine Clearance: 35.1 mL/min (A) (by C-G formula based on SCr of 1.86 mg/dL (H)).    No Known Allergies  Thank you for allowing pharmacy to be a part of this patient's care.  Erin Hearing PharmD., BCPS Clinical Pharmacist 04/25/2022 9:17 AM

## 2022-04-25 NOTE — Progress Notes (Signed)
NAME:  [name], MRN:  [id number], DOB:  1950-10-31, LOS: 1 ADMISSION DATE:  04/24/2022, CONSULTATION DATE:  04/25/2022  REFERRING MD:  Roderic Palau, EDP, CHIEF COMPLAINT: Hypothermia, bradycardia  History of Present Illness:  72 year old woman brought in by EMS from [facility] facility due to hypothermia temperature 92.2 and bradycardia in the 40s. By report, she was hospitalized for pneumonia at 10 days ago treated with antibiotics, was diagnosed with UTI at the facility 1 day PTA and started on antibiotic.  ED PA obtained history of recent fall and she complained of right hip and left shoulder pain She was placed on the warmer and temperature improved from 89.1 on arrival to 92.3, remained bradycardic in the 40s, normotensive Initial labs significant for hyponatremia 131, hypokalemia 2.7, hypomagnesemia 1.4, AKI BUN/creatinine 41/1.7, leukopenia with stable anemia and mild thrombocytopenia UA showed trace LE with 6-10 white cells and many bacteria Chest x-ray showed patchy right upper lobe infiltrate with moderate right pleural effusion versus elevated right hemidiaphragm and small left effusion   Pertinent  Medical History  Diabetes type 2, insulin requiring Hypertension 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 rate 15, height 5\' 6"  (1.676 m), weight 108.3 kg, SpO2 100 %.        Intake/Output Summary (Last 24 hours) at 04/25/2022 1431 Last data filed at 04/25/2022 1240 Gross per 24 hour   Intake 2633.95 ml  Output 100 ml  Net 2533.95 ml   Filed Weights   04/24/22 1347 04/24/22 1800 04/25/22 0600  Weight: 102 kg 106.8 kg 108.3 kg    Examination: General: Elderly woman, no distress, sitting up in bed HENT: Mild pallor, no icterus, short neck cannot assess JVD Lungs: Decreased breath sounds bilateral, no accessory muscle use Cardiovascular: S1 S 2 regular, no murmur Abdomen: Soft, nontender, left colostomy with green stool Extremities: 2+ edema Neuro: Awake, follows one-step commands, moves all 4 extremities GU: Clear urine   Labs show hyponatremia, BUN/creatinine 39/1.86, worsening anemia, hemoglobin dropped from 9.7-7.7, stable thrombocytopenia   Resolved Hospital Problem list     Assessment & Plan:  Hypothermia and bradycardia, in this nursing home resident recently treated for pneumonia and started on treatment for UTI 1 day ago at facility -Appears to be hypothyroid, TSH high at 12.9, free T4 is borderline low -Could be related to sepsis -Metoprolol has been held   -Low-dose pressors -Empiric antibiotics to cover nosocomial organisms, cefepime and vancomycin  -Hypokalemia, hypomagnesemia  repleted -Awaiting clarification of CODE STATUS from facility/guardian  Acute encephalopathy -improving, head CT negative Continue supportive care  Pleural effusion versus elevated right hemidiaphragm -repeat chest x-ray in a.m. Previous CT chest 07/2021 showed elevated right hemidiaphragm   CKD stage IIIa Hypothyroidism Anemia of chronic disease  -Per primary PCCM will follow peripherally   Best Practice (right click and "Reselect all SmartList Selections" daily)   Diet/type: NPO DVT prophylaxis: prophylactic heparin  GI prophylaxis: N/A Lines: N/A Foley:  N/A Code Status:  full code Last date of multidisciplinary goals of care discussion [NA]  Labs  CBC: Recent Labs  Lab 04/24/22 1135 04/25/22 0455  WBC 2.9* 4.3  NEUTROABS 2.3  --   HGB  9.7* 7.7*  HCT 30.5* 24.1*  MCV 86.2 87.0  PLT 79* 75*     Basic Metabolic Panel: Recent Labs  Lab 04/24/22 1135 04/24/22 1241 04/25/22 0455  NA 131*  --  134*  K 2.7*  --  3.9  CL 88*  --  92*  CO2 31  --  29  GLUCOSE 111*  --  85  BUN 41*  --  39*  CREATININE 1.70*  --  1.86*  CALCIUM 8.3*  --  8.1*  MG  --  1.4*  --     GFR: Estimated Creatinine Clearance: 35.1 mL/min (A) (by C-G formula based on SCr of 1.86 mg/dL (H)). Recent Labs  Lab 04/24/22 1135 04/24/22 1241 04/25/22 0455  WBC 2.9*  --  4.3  LATICACIDVEN 1.0 1.1  --      Liver Function Tests: Recent Labs  Lab 04/24/22 1135  AST 22  ALT 19  ALKPHOS 293*  BILITOT 0.9  PROT 6.6  ALBUMIN 3.2*    No results for input(s): "LIPASE", "AMYLASE" in the last 168 hours. No results for input(s): "AMMONIA" in the last 168 hours.  ABG    Component Value Date/Time   PHART 7.377 10/27/2014 0434   PCO2ART 42.1 10/27/2014 0434   PO2ART 125 (H) 10/27/2014 0434   HCO3 24.2 (H) 10/27/2014 0434   TCO2 25.5 10/27/2014 0434   ACIDBASEDEF 0.3 10/27/2014 0434   O2SAT 98.6 10/27/2014 0434     Coagulation Profile: Recent Labs  Lab 04/24/22 1135  INR 1.0     Cardiac Enzymes: No results for input(s): "CKTOTAL", "CKMB", "CKMBINDEX", "TROPONINI" in the last 168 hours.  HbA1C: Hgb A1c MFr Bld  Date/Time Value Ref Range Status  11/23/2021 09:13 AM 6.3 (H) 4.8 - 5.6 % Final    Comment:    (NOTE) Pre diabetes:          5.7%-6.4%  Diabetes:              >6.4%  Glycemic control for   <7.0% adults with diabetes   10/26/2014 09:15 AM 6.1 (H) 4.8 - 5.6 % Final    Comment:    (NOTE)         Pre-diabetes: 5.7 - 6.4         Diabetes: >6.4         Glycemic control for adults with diabetes: <7.0     CBG: Recent Labs  Lab 04/24/22 1527 04/24/22 1749 04/24/22 2119 04/25/22 0725 04/25/22 1151  GLUCAP 103* 105* 99 89 [place] MD. FCCP. [facility] Pager : [phone]  If no response to pager , please call [phone] until 7 pm After 7:00 pm call Elink  [phone]   04/25/2022

## 2022-04-25 NOTE — Progress Notes (Signed)
MEDICATION RELATED CONSULT NOTE - INITIAL   Pharmacy Consult for phenytoin Indication: seizure disorder  No Known Allergies  Patient Measurements: Height: 5\' 6"  (167.6 cm) Weight: 108.3 kg (238 lb 12.1 oz) IBW/kg (Calculated) : 59.3   Vital Signs: Temp: 98.1 F (36.7 C) (02/08 0800) Temp Source: Bladder (02/08 0800) BP: 124/50 (02/08 1100) Pulse Rate: 65 (02/08 1100) Intake/Output from previous day: 02/07 0701 - 02/08 0700 In: 3541.8 [I.V.:2095.8; IV Piggyback:1446] Out: 100 [Urine:100] Intake/Output from this shift: No intake/output data recorded.  Labs: Recent Labs    04/24/22 1135 04/24/22 1241 04/25/22 0455  WBC 2.9*  --  4.3  HGB 9.7*  --  7.7*  HCT 30.5*  --  24.1*  PLT 79*  --  75*  CREATININE 1.70*  --  1.86*  MG  --  1.4*  --   ALBUMIN 3.2*  --   --   PROT 6.6  --   --   AST 22  --   --   ALT 19  --   --   ALKPHOS 293*  --   --   BILITOT 0.9  --   --    Estimated Creatinine Clearance: 35.1 mL/min (A) (by C-G formula based on SCr of 1.86 mg/dL (H)).   Microbiology: Recent Results (from the past 720 hour(s))  Culture, blood (Routine x 2)     Status: None (Preliminary result)   Collection Time: 04/24/22 11:35 AM   Specimen: BLOOD  Result Value Ref Range Status   Specimen Description BLOOD BLOOD LEFT HAND  Final   Special Requests   Final    BOTTLES DRAWN AEROBIC AND ANAEROBIC Blood Culture adequate volume   Culture   Final    NO GROWTH < 24 HOURS Performed at [facility], [address], [place], [place] [address]    Report Status PENDING  Incomplete  MRSA Next Gen by PCR, Nasal     Status: Abnormal   Collection Time: 04/24/22  6:38 PM   Specimen: Nasal Mucosa; Nasal Swab  Result Value Ref Range Status   MRSA by PCR Next Gen DETECTED (A) NOT DETECTED Final    Comment:        The GeneXpert MRSA Assay (FDA approved for NASAL specimens only), is one component of a comprehensive MRSA colonization surveillance program. It is not intended to  diagnose MRSA infection nor to guide or monitor treatment for MRSA infections. RESULT CALLED TO, READ BACK BY AND VERIFIED WITH: FERO,J @0025  BY MATTHEWS, B 2.8.2024 Performed at [facility], [address], [place],  [address]     Medical History: Past Medical History:  Diagnosis Date   Arthritis    Asthma    Cancer [facility])    Rectal    Chronic renal disease, stage 3, moderately decreased glomerular filtration rate between 30-59 mL/min/1.73 square meter ([place]) 04/27/2014   Depression    Diabetes mellitus    years   GERD (gastroesophageal reflux disease)    Gout    History of recurrent UTIs    Hypertension    Iron deficiency 04/27/2014   Lung cancer ([place])    Mental retardation    stopped school at 9th grade    Numbness and tingling in hands    x several months    Rectal cancer ([place])    Seizures ([place])    more than 4 yrs since last seizure. UNknown etiology   Shortness of breath    with exertion   Vitamin B 12 deficiency 02/01/2015  Incidentally found without antibody testing.     Assessment: Patient on phenytoin for history of seizure disorder. Trough level checked this morning was elevated at 28 corrected for albumin. Will hold dose for 2 days and restart at lower dose on Saturday 2/11.   Goal of Therapy:  Phenytoin goal 10-20  Plan:  Restart phenytoin at 60mg  Saturday Consider rechecking level next week  Erin Hearing PharmD., BCPS Clinical Pharmacist 04/25/2022 11:48 AM

## 2022-04-25 NOTE — TOC Initial Note (Signed)
Transition of Care [facility]) - Initial/Assessment Note    Patient Details  Name: [name] MRN: [id number] Date of Birth: Oct 14, 1950  Transition of Care [facility]) CM/SW Contact:    Shade Flood, LCSW Phone Number: 04/25/2022, 11:03 AM  Clinical Narrative:                  Pt admitted from [facility]. Pt has a guardian, [name], with [employer]. MD has updated guardian of pt's status.   Anticipating pt will return to [facility]. TOC will follow.  Expected Discharge Plan: Skilled Nursing Facility Barriers to Discharge: Continued Medical Work up   Patient Goals and CMS Choice Patient states their goals for this hospitalization and ongoing recovery are:: get better          Expected Discharge Plan and Services In-house Referral: Clinical Social Work     Living arrangements for the past 2 months: [place]                                      Prior Living Arrangements/Services Living arrangements for the past 2 months: [place] Lives with:: Facility Resident Patient language and need for interpreter reviewed:: Yes Do you feel safe going back to the place where you live?: Yes      Need for Family Participation in Patient Care: No (Comment) Care giver support system in place?: Yes (comment)   Criminal Activity/Legal Involvement Pertinent to Current Situation/Hospitalization: No - Comment as needed  Activities of Daily Living Home Assistive Devices/Equipment: Wheelchair ADL Screening (condition at time of admission) Patient's cognitive ability adequate to safely complete daily activities?: No Is the patient deaf or have difficulty hearing?: No Does the patient have difficulty seeing, even when wearing glasses/contacts?: No Does the patient have difficulty concentrating, remembering, or making decisions?: Yes Patient able to express need for assistance with ADLs?: No Does the patient have  difficulty dressing or bathing?: Yes Independently performs ADLs?: No Communication: Independent Dressing (OT): Needs assistance Is this a change from baseline?: Pre-admission baseline Grooming: Needs assistance Is this a change from baseline?: Pre-admission baseline Feeding: Needs assistance Is this a change from baseline?: Pre-admission baseline Bathing: Needs assistance Is this a change from baseline?: Pre-admission baseline Toileting: Dependent Is this a change from baseline?: Pre-admission baseline In/Out Bed: Needs assistance Is this a change from baseline?: Pre-admission baseline Walks in Home: Needs assistance Is this a change from baseline?: Pre-admission baseline Does the patient have difficulty walking or climbing stairs?: Yes Weakness of Legs: Both Weakness of Arms/Hands: Both  Permission Sought/Granted                  Emotional Assessment       Orientation: : Oriented to Self Alcohol / Substance Use: Not Applicable Psych Involvement: No (comment)  Admission diagnosis:  Pneumonia [J18.9] Hypothermia, initial encounter [T68.XXXA] Patient Active Problem List   Diagnosis Date Noted   Pneumonia 04/24/2022   Pancytopenia ([place]) 04/24/2022   Swelling 07/01/2019   Pain due to onychomycosis of toenails of both feet 08/27/2018   Diabetic neuropathy ([place]) 08/27/2018   Vitamin B 12 deficiency 02/01/2015   Lung nodule 10/26/2014   Iron deficiency 04/27/2014   Chronic renal disease, stage 3, moderately decreased glomerular filtration rate between 30-59 mL/min/1.73 square meter ([place]) 04/27/2014   Incidental pulmonary nodule, greater than or equal to 48mm 04/26/2014  Abdominal pain 04/26/2014   Insomnia 04/26/2014   Rectal cancer metastasized to lung ([place]) 02/58/5277   Helicobacter pylori gastritis 02/08/2013   Elevated liver enzymes 12/02/2012   Anemia 08/29/2008   ASTHMA, WITH ACUTE EXACERBATION 07/01/2008   FLANK PAIN, LEFT 05/27/2008   SINUS TACHYCARDIA  03/02/2008   INTERTRIGO, CANDIDAL 10/19/2007   Uncontrolled type 2 diabetes mellitus with hyperglycemia, without long-term current use of insulin ([place]) 07/27/2007   LIVER FUNCTION TESTS, ABNORMAL 04/28/2007   UNSPECIFIED SLEEP DISTURBANCE 02/03/2007   DISEASE, PANCREAS NOS 11/20/2006   HYPERLIPIDEMIA 11/03/2006   GOUT NOS 11/03/2006   DEPRESSION 11/03/2006   RETARDATION, MENTAL NOS 11/03/2006   Essential hypertension 11/03/2006   ALLERGIC RHINITIS 11/03/2006   ASTHMA 11/03/2006   ARTHRITIS 11/03/2006   SEIZURE DISORDER 11/03/2006   MIGRAINES, HX OF 11/03/2006   PCP:  Nanine Means [facility]:   [facility] - [place], [place] [address] [place] [address] Phone: [phone] Fax: [phone]     Social Determinants of Health ([place]) Social History: SDOH Screenings   Tobacco Use: Medium Risk (04/24/2022)   SDOH Interventions:     Readmission Risk Interventions    04/25/2022   11:00 AM  Readmission Risk Prevention Plan  Transportation Screening Complete  HRI or Home Care Consult Complete  Social Work Consult for [place] Planning/Counseling Complete  Palliative Care Screening Complete  Medication Review Press photographer) Complete

## 2022-04-25 NOTE — Progress Notes (Signed)
PROGRESS NOTE     [name], is a 72 y.o. female, DOB - 06-Jul-1950, EQA:[id number]  Admit date - 04/24/2022   Admitting Physician Natajah Derderian Denton Brick, MD  Outpatient Primary MD for the patient is [place], [name]  LOS - 1  Chief Complaint  Patient presents with   Altered Mental Status        Brief Narrative:  - 72 y.o. 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 -Patient apparently is usually conversational and interactive ambulates short distances with a walker long distances with wheelchair until recently, -Treat #1 above   3) anemia and thrombocytopenia-- longstanding history of chronic anemia and chronic thrombocytopenia  --Patient recently got iron infusions around 03/25/2022, she was scheduled to start getting Aranesp shots soon -- hemoglobin is 9.7 hematocrit is 30.5 and platelets are 79 (platelets were 145 on 04/09/2022) -Hgb was 7.9 on 03/25/2022 and patient received iron infusion at the time -Serum iron is 91 -Folate is low at 4.3 -Previously 04/24/2022 with severe sepsis with septic shock B12 replacements -c/n folic acid -Hgb trending further down suspect hemodilution from IV fluids -Transfuse if clinically indicated -No acute bleeding concerns at this time   4) hypokalemia/hyponatremia/hypochloremia/hypomagnesemia-- -Potassium is 2.7  sodium is 131 chloride is 88, -Mag 1.4 -No vomiting or diarrhea -Replace and recheck   5) hypothyroidism---TSH is 12.95 -May be contributing to hypothermia -Check T3  -T4 is 0.69 which is borderline low normal -Start levothyroxine   6)CKD stage -3A   --- -Creatinine trending up slightly but still close to baseline -renally adjust medications, avoid nephrotoxic agents / dehydration  / hypotension   7)Stage IV rectal cancer with oligometastatic to the lung, status post prior chemo and radiation therapy----recent surveillance showed no evidence of recurrence -Patient follows with Dr. Delton Coombes   8) history of seizures--- no recent seizures reported - continue Dilantin, check serum Dilantin level   9) bradycardia----suspect related to hypothermia and metoprolol use -Hold metoprolol -Use warming blankets   10)DM2- Use Novolog/Humalog Sliding scale insulin with Accu-Cheks/Fingersticks as ordered    11)Social/Ethics-discussed with state appointed legal guardian [name] -Full code for now to be addressed with his supervisor later  12) Dilantin toxicity--- pharmacy consult for adjustment of Dilantin  13)Dysphagia--- speech pathologist evaluation appreciated, recommends dysphagia 1 (Puree);Thin liquid   CRITICAL CARE Performed by: Roxan Hockey   Total critical care time: 57 minutes  Critical care time was exclusive of separately billable procedures and treating other patients.  Critical care was necessary to treat or prevent imminent or life-threatening deterioration. - Try to wean off IV Levophed as BP allows  Critical care was time spent personally by me on the following activities: development of treatment plan with patient and/or surrogate as well as nursing, discussions with consultants, evaluation of patient's response to treatment, examination of patient, obtaining history from patient or surrogate, ordering and performing treatments and interventions, ordering  and review of laboratory  studies, ordering and review of radiographic studies, pulse oximetry and re-evaluation of patient's condition.  Status is: Inpatient   Remains inpatient appropriate because:    Disposition: The patient is from: SNF              Anticipated d/c is to: SNF              Anticipated d/c date is: > 3 days              Patient currently is not medically stable to d/c. Barriers: Not Clinically Stable-   Code Status :  -  Code Status: Full Code   Family Communication:   Discussed with  state appointed legal guardian [name]  DVT Prophylaxis  :   - SCDs  SCDs Start: 04/24/22 1834 Place TED hose Start: 04/24/22 1834   Lab Results  Component Value Date   PLT 75 (L) 04/25/2022    Inpatient Medications  Scheduled Meds:  allopurinol  200 mg Oral Daily   aspirin EC  81 mg Oral Daily   calcium carbonate  2 tablet Oral BID   Chlorhexidine Gluconate Cloth  6 each Topical Q0600   donepezil  10 mg Oral Daily   escitalopram  10 mg Oral Daily   ferrous sulfate  325 mg Oral Q breakfast   folic acid  1 mg Oral Daily   insulin aspart  0-5 Units Subcutaneous QHS   insulin aspart  0-6 Units Subcutaneous TID WC   levothyroxine  150 mcg Oral Q0600   mometasone-formoterol  2 puff Inhalation BID   mupirocin ointment  1 Application Nasal BID   pantoprazole  40 mg Oral Daily   [START ON 04/27/2022] phenytoin  60 mg Oral Daily   pravastatin  40 mg Oral q1800   senna-docusate  2 tablet Oral QHS   sodium chloride flush  3 mL Intravenous Q12H   sodium chloride flush  3 mL Intravenous Q12H   Continuous Infusions:  sodium chloride     azithromycin Stopped (04/25/22 1351)   ceFEPime (MAXIPIME) IV Stopped (04/25/22 0630)   dextrose 5 % and 0.9% NaCl Stopped (04/25/22 1519)   norepinephrine (LEVOPHED) Adult infusion 3 mcg/min (04/25/22 1731)   vancomycin     PRN Meds:.sodium chloride, acetaminophen **OR** acetaminophen, albuterol, bisacodyl, fentaNYL (SUBLIMAZE)  injection, hydrALAZINE, ondansetron **OR** ondansetron (ZOFRAN) IV, oxyCODONE, polyethylene glycol, sodium chloride flush, traZODone   Anti-infectives (From admission, onward)    Start     Dose/Rate Route Frequency Ordered Stop   04/25/22 2200  vancomycin (VANCOREADY) IVPB 1500 mg/300 mL        1,500 mg 150 mL/hr over 120 Minutes Intravenous Every 48 hours 04/24/22 1618     04/25/22 0600  ceFEPIme (MAXIPIME) 2 g in sodium chloride 0.9 % 100 mL IVPB        2 g 200 mL/hr over 30 Minutes Intravenous Every 12 hours 04/24/22 1621     04/24/22 1400  vancomycin (VANCOREADY) IVPB 2000 mg/400 mL        2,000 mg 200 mL/hr over 120 Minutes Intravenous  Once 04/24/22 1348 04/24/22 1737   04/24/22 1245  azithromycin (ZITHROMAX) 500 mg in sodium chloride 0.9 % 250 mL IVPB        500 mg 250 mL/hr over 60 Minutes Intravenous Every 24 hours 04/24/22 1237     04/24/22 1215  ceFEPIme (MAXIPIME) 2 g in sodium chloride 0.9 % 100 mL IVPB        2 g 200  mL/hr over 30 Minutes Intravenous  Once 04/24/22 1213 04/24/22 1418         Subjective: [name] today has no fevers, no emesis,  No chest pain,   -Sleepy on and off -Hypothermia has resolved   Objective: Vitals:   04/25/22 1300 04/25/22 1400 04/25/22 1500 04/25/22 1625  BP: (!) 124/42 (!) 100/37 (!) 133/46   Pulse: 68 64 65   Resp: 19 13 10    Temp:    98.3 F (36.8 C)  TempSrc:    Bladder  SpO2: 99% 100% 100%   Weight:      Height:        Intake/Output Summary (Last 24 hours) at 04/25/2022 1733 Last data filed at 04/25/2022 0017 Gross per 24 hour  Intake 2735.84 ml  Output 100 ml  Net 2635.84 ml   Filed Weights   04/24/22 1347 04/24/22 1800 04/25/22 0600  Weight: 102 kg 106.8 kg 108.3 kg    Physical Exam  Physical Examination: General appearance - alert,  in no distress  Mental status -somewhat sleepy and disoriented Eyes - sclera anicteric Neck - supple, no JVD elevation , Chest - clear  to auscultation bilaterally,  symmetrical air movement,  Heart - S1 and S2 normal, regular , bradycardic Abdomen - soft, nontender, nondistended, +BS Neurological - -sleepy and disoriented, appears to be moving all extremities well spontaneously Extremities - +ve pedal edema noted, intact peripheral pulses  Skin - warm, dry    Data Reviewed: I have personally reviewed following labs and imaging studies  CBC: Recent Labs  Lab 04/24/22 1135 04/25/22 0455  WBC 2.9* 4.3  NEUTROABS 2.3  --   HGB 9.7* 7.7*  HCT 30.5* 24.1*  MCV 86.2 87.0  PLT 79* 75*   Basic Metabolic Panel: Recent Labs  Lab 04/24/22 1135 04/24/22 1241 04/25/22 0455  NA 131*  --  134*  K 2.7*  --  3.9  CL 88*  --  92*  CO2 31  --  29  GLUCOSE 111*  --  85  BUN 41*  --  39*  CREATININE 1.70*  --  1.86*  CALCIUM 8.3*  --  8.1*  MG  --  1.4*  --    GFR: Estimated Creatinine Clearance: 35.1 mL/min (A) (by C-G formula based on SCr of 1.86 mg/dL (H)). Liver Function Tests: Recent Labs  Lab 04/24/22 1135  AST 22  ALT 19  ALKPHOS 293*  BILITOT 0.9  PROT 6.6  ALBUMIN 3.2*   Recent Results (from the past 240 hour(s))  Culture, blood (Routine x 2)     Status: None (Preliminary result)   Collection Time: 04/24/22 11:35 AM   Specimen: BLOOD  Result Value Ref Range Status   Specimen Description BLOOD BLOOD LEFT HAND  Final   Special Requests   Final    BOTTLES DRAWN AEROBIC AND ANAEROBIC Blood Culture adequate volume   Culture   Final    NO GROWTH < 24 HOURS Performed at [facility], [address], [place], [place] [address]    Report Status PENDING  Incomplete  MRSA Next Gen by PCR, Nasal     Status: Abnormal   Collection Time: 04/24/22  6:38 PM   Specimen: Nasal Mucosa; Nasal Swab  Result Value Ref Range Status   MRSA by PCR Next Gen DETECTED (A) NOT DETECTED Final    Comment:        The GeneXpert MRSA Assay (FDA approved for NASAL specimens only), is one component of a  comprehensive MRSA colonization surveillance program.  It is not intended to diagnose MRSA infection nor to guide or monitor treatment for MRSA infections. RESULT CALLED TO, READ BACK BY AND VERIFIED WITH: FERO,J @0025  BY MATTHEWS, B 2.8.2024 Performed at [facility], [address], [place], [place] [address]       Radiology Studies: CT Hip Right Wo Contrast  Result Date: 04/24/2022 CLINICAL DATA:  Metastatic rectal adenocarcinoma, hip trauma. Renal insufficiency. Radiation for rectal cancer. EXAM: CT OF THE RIGHT HIP WITHOUT CONTRAST TECHNIQUE: Multidetector CT imaging of the right hip was performed according to the standard protocol. Multiplanar CT image reconstructions were also generated. RADIATION DOSE REDUCTION: This exam was performed according to the departmental dose-optimization program which includes automated exposure control, adjustment of the mA and/or kV according to patient size and/or use of iterative reconstruction technique. COMPARISON:  CT pelvis 07/17/2021 and hip radiographs 04/24/2022 FINDINGS: Bones/Joint/Cartilage No discrete femoral neck fracture is readily apparent. There is spurring along the femoral head contributing to the recent appearance at conventional radiography. Widespread scattered sclerosis and lucency in the bony pelvis, this is been present on numerous prior exams, some of this may reflect response to radiation therapy there is also suspected underlying diffuse idiopathic skeletal hyperostosis and possible renal osteodystrophy contributing. Paget's disease has been raised as a possibility for some of the widespread skeletal appearance, however the widespread distribution would be somewhat unusual for Paget's disease. In any case there is no substantial change on today's exam compared 07/17/2021 to indicate an acute process. Chronically fragmented anterior acetabular osteophytes noted. No hip effusion. Ligaments Suboptimally assessed by CT. Muscles and Tendons Calcific tendinopathy along the proximal hamstring  tendons. Soft tissues Foley catheter in the urinary bladder. Continued soft tissue density in the vicinity of the rectum or rectal pouch, some of this may be treatment related in this is only partially included on today's exam. IMPRESSION: 1. No femoral neck fracture is readily apparent. There is spurring along the femoral head contributing to the recent appearance at conventional radiography. 2. Widespread scattered sclerosis and lucency in the bony pelvis, this is been present on numerous prior exams, some of this may reflect response to radiation therapy. There is also suspected underlying diffuse idiopathic skeletal hyperostosis and possible renal osteodystrophy contributing. Paget's disease has been raised as a possibility for some of the widespread skeletal appearance, however the widespread distribution would be somewhat unusual for Paget's disease. In any case there is no substantial change in the right femur or right hemipelvis on today's exam compared to 07/17/2021 to indicate an acute process. 3. Calcific tendinopathy along the proximal hamstring tendons. 4. Continued soft tissue density in the vicinity of the rectum or rectal pouch, some of this may be treatment related in this is only partially included on today's exam. Electronically Signed   By: Van Clines M.D.   On: 04/24/2022 17:45   CT Head Wo Contrast  Result Date: 04/24/2022 CLINICAL DATA:  Head trauma.  Intracranial injury suspected. EXAM: CT HEAD WITHOUT CONTRAST TECHNIQUE: Contiguous axial images were obtained from the base of the skull through the vertex without intravenous contrast. RADIATION DOSE REDUCTION: This exam was performed according to the departmental dose-optimization program which includes automated exposure control, adjustment of the mA and/or kV according to patient size and/or use of iterative reconstruction technique. COMPARISON:  MRI 11/19/2005 FINDINGS: Brain: Age related atrophy. No evidence of old or acute  focal infarction, mass lesion, hemorrhage, hydrocephalus or extra-axial collection. Vascular: No abnormal vascular finding. Skull:  Normal Sinuses/Orbits: Clear/normal Other: None IMPRESSION: No acute or traumatic finding. Age related atrophy. Electronically Signed   By: Nelson Chimes M.D.   On: 04/24/2022 17:39   DG Hip Unilat W or Wo Pelvis 2-3 Views Right  Result Date: 04/24/2022 CLINICAL DATA:  Fall EXAM: DG HIP (WITH OR WITHOUT PELVIS) 2-3V RIGHT COMPARISON:  07/17/2021 FINDINGS: Suspect acute nondisplaced fracture of the right femoral neck. Bilateral hip joints intact without dislocation. 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 Degenerative changes of both SI joints. No pelvic diastasis. IMPRESSION: 1. Suspect nondisplaced right femoral neck fracture. Further evaluation can be obtained with CT. 2. 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 Electronically Signed   By: Davina Poke D.O.   On: 04/24/2022 13:32   DG Shoulder Left  Result Date: 04/24/2022 CLINICAL DATA:  Fall, fever EXAM: LEFT SHOULDER - 2+ VIEW COMPARISON:  11/30/2017 FINDINGS: Osseous demineralization. AC joint alignment normal. Mild glenohumeral degenerative changes. Humerus approximates the undersurface of the acromion consistent with chronic rotator cuff tear. Small calcified nodule identified adjacent to humeral head, suspect rotator cuff calcification. No acute fracture, dislocation, or bone destruction. Visualized ribs intact. IMPRESSION: LEFT glenohumeral degenerative changes and evidence of chronic rotator cuff tear. Suspected calcific tendinitis of the rotator cuff. No acute osseous abnormalities. Electronically Signed   By: Lavonia Dana M.D.   On: 04/24/2022 13:25   DG Chest Port 1 View  Result Date: 04/24/2022 CLINICAL DATA:  Pneumonia, shortness of breath, treated with antibiotics, urinary burning began yesterday, diagnosed with UTI and started on antibiotics EXAM:  PORTABLE CHEST 1 VIEW COMPARISON:  Portable exam 1059 hours compared to 06/03/2018 FINDINGS: LEFT subclavian Port-A-Cath with tip projecting over LEFT brachiocephalic vein. Normal heart size and mediastinal contours. Small to moderate RIGHT pleural effusion and basilar atelectasis with underlying eventration of RIGHT diaphragm noted. Small LEFT basilar effusion. Patchy RIGHT upper lobe infiltrate. No pneumothorax or acute osseous findings. IMPRESSION: Bibasilar effusions and atelectasis greater on RIGHT. Patchy RIGHT upper lobe infiltrate. Electronically Signed   By: Lavonia Dana M.D.   On: 04/24/2022 11:08     Scheduled Meds:  allopurinol  200 mg Oral Daily   aspirin EC  81 mg Oral Daily   calcium carbonate  2 tablet Oral BID   Chlorhexidine Gluconate Cloth  6 each Topical Q0600   donepezil  10 mg Oral Daily   escitalopram  10 mg Oral Daily   ferrous sulfate  325 mg Oral Q breakfast   folic acid  1 mg Oral Daily   insulin aspart  0-5 Units Subcutaneous QHS   insulin aspart  0-6 Units Subcutaneous TID WC   levothyroxine  150 mcg Oral Q0600   mometasone-formoterol  2 puff Inhalation BID   mupirocin ointment  1 Application Nasal BID   pantoprazole  40 mg Oral Daily   [START ON 04/27/2022] phenytoin  60 mg Oral Daily   pravastatin  40 mg Oral q1800   senna-docusate  2 tablet Oral QHS   sodium chloride flush  3 mL Intravenous Q12H   sodium chloride flush  3 mL Intravenous Q12H   Continuous Infusions:  sodium chloride     azithromycin Stopped (04/25/22 1351)   ceFEPime (MAXIPIME) IV Stopped (04/25/22 0630)   dextrose 5 % and 0.9% NaCl Stopped (04/25/22 1519)   norepinephrine (LEVOPHED) Adult infusion 3 mcg/min (04/25/22 1731)   vancomycin      LOS: 1 day   Corderro Koloski M.D  on 04/25/2022 at 5:33 PM  Go to [email or web address] - for contact info  [facility] - Office  [phone]  If 7PM-7AM, please contact night-coverage [email or web address] 04/25/2022, 5:33 PM

## 2022-04-26 ENCOUNTER — Inpatient Hospital Stay (HOSPITAL_COMMUNITY): Payer: 59

## 2022-04-26 DIAGNOSIS — A419 Sepsis, unspecified organism: Secondary | ICD-10-CM | POA: Diagnosis not present

## 2022-04-26 DIAGNOSIS — I10 Essential (primary) hypertension: Secondary | ICD-10-CM

## 2022-04-26 DIAGNOSIS — E1165 Type 2 diabetes mellitus with hyperglycemia: Secondary | ICD-10-CM

## 2022-04-26 DIAGNOSIS — N1831 Chronic kidney disease, stage 3a: Secondary | ICD-10-CM | POA: Diagnosis not present

## 2022-04-26 DIAGNOSIS — D61818 Other pancytopenia: Secondary | ICD-10-CM

## 2022-04-26 DIAGNOSIS — R6521 Severe sepsis with septic shock: Secondary | ICD-10-CM | POA: Diagnosis not present

## 2022-04-26 LAB — RENAL FUNCTION PANEL
Albumin: 2.9 g/dL — ABNORMAL LOW (ref 3.5–5.0)
Anion gap: 10 (ref 5–15)
BUN: 45 mg/dL — ABNORMAL HIGH (ref 8–23)
CO2: 27 mmol/L (ref 22–32)
Calcium: 8.4 mg/dL — ABNORMAL LOW (ref 8.9–10.3)
Chloride: 98 mmol/L (ref 98–111)
Creatinine, Ser: 2.61 mg/dL — ABNORMAL HIGH (ref 0.44–1.00)
GFR, Estimated: 19 mL/min — ABNORMAL LOW (ref >60)
Glucose, Bld: 166 mg/dL — ABNORMAL HIGH (ref 70–99)
Phosphorus: 4.4 mg/dL (ref 2.5–4.6)
Potassium: 3.6 mmol/L (ref 3.5–5.1)
Sodium: 135 mmol/L (ref 135–145)

## 2022-04-26 LAB — GLUCOSE, CAPILLARY
Glucose-Capillary: 169 mg/dL — ABNORMAL HIGH (ref 70–99)
Glucose-Capillary: 138 mg/dL — ABNORMAL HIGH (ref 70–99)

## 2022-04-26 LAB — PREPARE RBC (CROSSMATCH)

## 2022-04-26 MED ORDER — ONDANSETRON HCL 4 MG/2ML IJ SOLN
4.0000 mg | Freq: Four times a day (QID) | INTRAMUSCULAR | Status: DC | PRN
  Administered 2022-04-30: 4 mg via INTRAVENOUS
  Filled 2022-04-26: qty 2

## 2022-04-26 MED ORDER — CALCIUM CARBONATE ANTACID 500 MG PO CHEW
2.0000 | CHEWABLE_TABLET | Freq: Two times a day (BID) | ORAL | Status: DC
  Administered 2022-04-26 – 2022-04-30 (×8): 400 mg
  Filled 2022-04-26 (×8): qty 2

## 2022-04-26 MED ORDER — ONDANSETRON HCL 4 MG PO TABS: 4.0000 mg | ORAL_TABLET | Freq: Four times a day (QID) | ORAL | Status: DC | PRN

## 2022-04-26 MED ORDER — ACETAMINOPHEN 650 MG RE SUPP: 650.0000 mg | Freq: Four times a day (QID) | RECTAL | Status: DC | PRN

## 2022-04-26 MED ORDER — OXYCODONE HCL 5 MG PO TABS
5.0000 mg | ORAL_TABLET | ORAL | Status: DC | PRN
  Administered 2022-04-28 (×2): 5 mg
  Filled 2022-04-26 (×2): qty 1

## 2022-04-26 MED ORDER — TRAZODONE HCL 50 MG PO TABS
50.0000 mg | ORAL_TABLET | Freq: Every evening | ORAL | Status: DC | PRN
  Administered 2022-04-27: 50 mg
  Filled 2022-04-26: qty 1

## 2022-04-26 MED ORDER — ESCITALOPRAM OXALATE 10 MG PO TABS
10.0000 mg | ORAL_TABLET | Freq: Every day | ORAL | Status: DC
  Administered 2022-04-27 – 2022-04-29 (×3): 10 mg
  Filled 2022-04-26 (×4): qty 1

## 2022-04-26 MED ORDER — GLUCERNA SHAKE PO LIQD
237.0000 mL | Freq: Four times a day (QID) | ORAL | Status: DC
  Administered 2022-04-26 – 2022-04-30 (×13): 237 mL via ORAL

## 2022-04-26 MED ORDER — ALLOPURINOL 100 MG PO TABS
200.0000 mg | ORAL_TABLET | Freq: Every day | ORAL | Status: DC
  Administered 2022-04-27 – 2022-04-30 (×4): 200 mg
  Filled 2022-04-26 (×4): qty 2

## 2022-04-26 MED ORDER — POLYETHYLENE GLYCOL 3350 17 G PO PACK: 17.0000 g | PACK | Freq: Every day | ORAL | Status: DC | PRN

## 2022-04-26 MED ORDER — LEVOTHYROXINE SODIUM 75 MCG PO TABS
150.0000 ug | ORAL_TABLET | Freq: Every day | ORAL | Status: DC
  Administered 2022-04-27 – 2022-04-30 (×4): 150 ug
  Filled 2022-04-26 (×4): qty 2

## 2022-04-26 MED ORDER — SENNOSIDES-DOCUSATE SODIUM 8.6-50 MG PO TABS
2.0000 | ORAL_TABLET | Freq: Every day | ORAL | Status: DC
  Administered 2022-04-26 – 2022-04-29 (×4): 2
  Filled 2022-04-26 (×4): qty 2

## 2022-04-26 MED ORDER — ACETAMINOPHEN 325 MG PO TABS
650.0000 mg | ORAL_TABLET | Freq: Four times a day (QID) | ORAL | Status: DC | PRN
  Administered 2022-04-28: 650 mg
  Filled 2022-04-26: qty 2

## 2022-04-26 MED ORDER — SODIUM CHLORIDE 0.9% IV SOLUTION: Freq: Once | INTRAVENOUS | Status: AC

## 2022-04-26 MED ORDER — FREE WATER
250.0000 mL | Freq: Four times a day (QID) | Status: DC
  Administered 2022-04-26 – 2022-04-30 (×15): 250 mL

## 2022-04-26 MED ORDER — FOLIC ACID 1 MG PO TABS
1.0000 mg | ORAL_TABLET | Freq: Every day | ORAL | Status: DC
  Administered 2022-04-27 – 2022-04-30 (×4): 1 mg
  Filled 2022-04-26 (×4): qty 1

## 2022-04-26 MED ORDER — PRAVASTATIN SODIUM 40 MG PO TABS
40.0000 mg | ORAL_TABLET | Freq: Every day | ORAL | Status: DC
  Administered 2022-04-26 – 2022-04-29 (×4): 40 mg
  Filled 2022-04-26 (×4): qty 1

## 2022-04-26 MED ORDER — SODIUM CHLORIDE 0.9 % IV SOLN
2.0000 g | INTRAVENOUS | Status: DC
  Administered 2022-04-27 – 2022-04-30 (×4): 2 g via INTRAVENOUS
  Filled 2022-04-26 (×4): qty 12.5

## 2022-04-26 MED ORDER — DONEPEZIL HCL 5 MG PO TABS
10.0000 mg | ORAL_TABLET | Freq: Every day | ORAL | Status: DC
  Administered 2022-04-27 – 2022-04-30 (×4): 10 mg
  Filled 2022-04-26 (×4): qty 2

## 2022-04-26 MED ORDER — SODIUM CHLORIDE 0.9 % IV SOLN: INTRAVENOUS | Status: DC

## 2022-04-26 NOTE — Progress Notes (Signed)
NAME:  [name], MRN:  [id number], DOB:  [birth year], LOS: 2 ADMISSION DATE:  04/24/2022, CONSULTATION DATE:  04/26/2022  REFERRING MD:  Roderic Palau, EDP, CHIEF COMPLAINT: Hypothermia, bradycardia  History of Present Illness:  72 yo female brought from [facility] with hypothermia, bradycardia, and hypotension.  She had recent tx for pneumonia and UTI.  She also had recent fall.  CXR showed RUL infiltrate.  She was started on abx, pressors, and external warming.  PCCM consulted to assist with management in ICU.  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  Maintaining normal body temperature.  Objective   Blood pressure (!) 125/43, pulse 69, temperature 97.9 F (36.6 C), resp. rate 11, height 5\' 6"  (1.676 m), weight 108.3 kg, SpO2 97 %.        Intake/Output Summary (Last 24 hours) at 04/26/2022 1502 Last data filed at 04/26/2022 1355 Gross per 24 hour  Intake 4103.32 ml  Output 100 ml  Net 4003.32 ml   Filed Weights   04/24/22 1347 04/24/22 1800 04/25/22 0600  Weight: 102 kg 106.8 kg 108.3 kg    Examination:  General - sleepy Eyes - pupils reactive ENT - no sinus tenderness, no stridor, NG tube in place Cardiac - regular rate/rhythm, no murmur Chest - equal breath sounds b/l, no wheezing or rales Abdomen - soft, non tender, + bowel sounds Extremities - no cyanosis, clubbing, or edema Skin - no rashes Neuro - wakes up with stimulation, moves around some, moans when stimulated, not following commands  Resolved Hospital Problem list   Hypothermia and bradycardia in setting of sepsis, Septic shock  Assessment & Plan:   Rt lung  pneumonia. - day 3 of ABx per primary team - f/u CXR intermittently  AKI from ATN in setting of sepsis. CKD 3a. - baseline creatinine 1.96 from 03/25/22 - f/u BMET, monitor urine outpt - don't think she is a suitable candidate for renal replacement given her poor functional status  DM type 2 poorly controlled with hyperglycemia. - SSI  Anemia of chronic disease and critical illness. Thrombocytopenia in setting of sepsis. - f/u CBC  Acute metabolic encephalopathy 2nd to sepsis. Hx of seizures. - monitor mental status - AEDs per primary team  Hypothyroidism. - started on levothyroxine  PCCM will sign off.  Please call if additional help needed while she is in hospital.  Best Practice (right click and "Reselect all SmartList Selections" daily)   Diet/type: NPO DVT prophylaxis: prophylactic heparin  GI prophylaxis: N/A Lines: N/A Foley:  N/A Code Status:  full code Last date of multidisciplinary goals of care discussion [NA]  Labs       Latest Ref Rng & Units 04/26/2022    8:56 AM 04/25/2022    4:55 AM 04/24/2022   11:35 AM  CMP  Glucose 70 - 99 mg/dL 166  85  111   BUN 8 - 23 mg/dL 45  39  41   Creatinine 0.44 - 1.00 mg/dL 2.61  1.86  1.70   Sodium 135 - 145 mmol/L 135  134  131   Potassium 3.5 - 5.1 mmol/L 3.6  3.9  2.7   Chloride 98 - 111 mmol/L 98  92  88   CO2 22 - 32 mmol/L 27  29  31    Calcium 8.9 - 10.3 mg/dL 8.4  8.1  8.3   Total Protein 6.5 - 8.1 g/dL   6.6   Total Bilirubin 0.3 - 1.2 mg/dL   0.9   Alkaline Phos 38 - 126 U/L   293   AST 15 - 41 U/L   22   ALT 0 - 44 U/L   19        Latest Ref Rng & Units 04/26/2022    8:56 AM 04/25/2022    4:55 AM 04/24/2022   11:35 AM  CBC  WBC 4.0 - 10.5 K/uL 5.0  4.3  2.9   Hemoglobin 12.0 - 15.0 g/dL 7.2  7.7  9.7   Hematocrit 36.0 - 46.0 % 22.7  24.1  30.5   Platelets 150 - 400 K/uL 75  75  79     ABG    Component Value Date/Time   PHART 7.377 10/27/2014 0434   PCO2ART 42.1 10/27/2014 0434   PO2ART 125 (H)  10/27/2014 0434   HCO3 24.2 (H) 10/27/2014 0434   TCO2 25.5 10/27/2014 0434   ACIDBASEDEF 0.3 10/27/2014 0434   O2SAT 98.6 10/27/2014 0434    CBG (last 3)  Recent Labs    04/25/22 2218 04/26/22 0753 04/26/22 1201  GLUCAP 150* 169* 138*   Signature:  Chesley Mires, MD [place] Pager - [phone] or [phone] 04/26/2022, 3:12 PM

## 2022-04-26 NOTE — Progress Notes (Signed)
PROGRESS NOTE     [name], is a 72 y.o. female, DOB - 12-Nov-1950, OXB:[id number]  Admit date - 04/24/2022   Admitting Physician Ladasha Schnackenberg Denton Brick, MD  Outpatient Primary MD for the patient is Alvo, [name]  LOS - 2  Chief Complaint  Patient presents with   Altered Mental Status        Brief Narrative:  - 72 y.o. 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, status post prior chemo and radiation therapy, CKD 3A, chronic anemia,  chronic thrombocytopenia, history of seizures, hypertension, DM, obesity and B12 deficiency     -Assessment and Plan: 1)Severe sepsis with septic shock - secondary to presumed right-sided pneumonia and UTI---- 04/26/22 -continues to require IV Levophed --will attempt to wean off IV Levophed again today -Despite IV fluids BP remains soft  --c/n with IV fluids, IV Vanco, azithromycin and cefepime pending further culture data -Blood and urine cultures NGTD --Attempted to wean off warming blanket patient became hypothermic -Attempted to wean off Levophed IV patient became hypotensive   2) acute metabolic encephalopathy--- due to #1 above Additional history obtained from state appointed legal guardian at bedside Mr. [name] -Patient apparently is usually conversational and interactive ambulates short distances with a walker long distances with wheelchair until recently, -Treat #1 above   3) acute on chronic anemia and Thrombocytopenia-- longstanding history of chronic anemia and chronic thrombocytopenia  --Patient recently got iron infusions around 03/25/2022, she was scheduled to start getting Aranesp shots soon -- hemoglobin is 9.7 hematocrit is 30.5 and platelets are 79 (platelets were 145 on 04/09/2022) -Hgb was 7.9 on 03/25/2022 and patient received iron infusion at the time -Serum iron is 91 -Folate is low at 4.3 -Previously 04/24/2022 with severe sepsis with septic shock B12 replacements -c/n  folic acid 08/23/32 -Hgb down to 7.2 -No acute bleeding concerns at this time -Will transfuse 1 unit of PRBC today   4) hypokalemia/hyponatremia/hypochloremia/hypomagnesemia--  -No vomiting or diarrhea -Replace and recheck   5) hypothyroidism---TSH is 12.95 -May be contributing to hypothermia -Check T3  -T4 is 0.69 which is borderline low normal -Start levothyroxine   6)Aki on CKD stage -3A   --- -Creatinine trending up due to hypotension and dehydration in the setting of severe sepsis with septic shock -renally adjust medications, avoid nephrotoxic agents / dehydration  / hypotension   7)Stage IV Rectal Cancer with oligometastatic to the lung, status post prior chemo and radiation therapy----recent surveillance showed no evidence of recurrence -Patient follows with Dr. Delton Coombes   8) history of seizures--- no recent seizures reported -Elevated Dilantin level was noted -Adjust dilantin   9)Bradycardia----suspect related to hypothermia and metoprolol use -Hold metoprolol -Use warming blankets   10)DM2- Use Novolog/Humalog Sliding scale insulin with Accu-Cheks/Fingersticks as ordered    11)Social/Ethics-discussed with state appointed legal guardian Mr. [name] -Full code for now to be addressed with his supervisor later  12) Dilantin toxicity--- pharmacy consult for adjustment of Dilantin  13)Dysphagia--- speech pathologist evaluation appreciated, recommends n.p.o. status for now -NG tube placed on 04/26/2022 to allow for patient administration and feeding and free water administration  CRITICAL CARE Performed by: Roxan Hockey   Total critical care time: 53 minutes  Critical care time was exclusive of separately billable procedures and treating other patients.  Critical care was necessary to treat or prevent imminent or life-threatening deterioration. - -Attempted to wean off warming blanket patient became hypothermic -Attempted to wean off Levophed IV patient  became hypotensive Try to wean off IV Levophed as BP allows  Critical care was time spent personally by me on the following activities: development of treatment plan with patient and/or surrogate as well as nursing, discussions with consultants, evaluation of patient's response to treatment, examination of patient, obtaining history from patient or surrogate, ordering and performing treatments and interventions, ordering and review of laboratory studies, ordering and review of radiographic studies, pulse oximetry and re-evaluation of patient's condition.  Status is: Inpatient   Remains inpatient appropriate because:    Disposition: The patient is from: SNF              Anticipated d/c is to: SNF              Anticipated d/c date is: > 3 days              Patient currently is not medically stable to d/c. Barriers: Not Clinically Stable-   Code Status :  -  Code Status: Full Code   Family Communication:   Discussed with  state appointed legal guardian Mr. [name]  DVT Prophylaxis  :   - SCDs  SCDs Start: 04/24/22 1834 Place TED hose Start: 04/24/22 1834   Lab Results  Component Value Date   PLT 75 (L) 04/26/2022    Inpatient Medications  Scheduled Meds:  allopurinol  200 mg Oral Daily   aspirin EC  81 mg Oral Daily   calcium carbonate  2 tablet Oral BID   Chlorhexidine Gluconate Cloth  6 each Topical Q0600   donepezil  10 mg Oral Daily   escitalopram  10 mg Oral Daily   feeding supplement (GLUCERNA SHAKE)  237 mL Oral QID   ferrous sulfate  325 mg Oral Q breakfast   folic acid  1 mg Oral Daily   free water  250 mL Per Tube QID   insulin aspart  0-5 Units Subcutaneous QHS   insulin aspart  0-6 Units Subcutaneous TID WC   levothyroxine  150 mcg Oral Q0600   mometasone-formoterol  2 puff Inhalation BID   mupirocin ointment  1 Application Nasal BID   pantoprazole  40 mg Oral Daily   [START ON 04/27/2022] phenytoin  60 mg Oral Daily   pravastatin  40 mg Oral q1800    senna-docusate  2 tablet Oral QHS   sodium chloride flush  3 mL Intravenous Q12H   sodium chloride flush  3 mL Intravenous Q12H   Continuous Infusions:  sodium chloride     azithromycin 500 mg (04/26/22 1252)   [START ON 04/27/2022] ceFEPime (MAXIPIME) IV     dextrose 5 % and 0.9% NaCl 150 mL/hr at 04/26/22 1207   norepinephrine (LEVOPHED) Adult infusion Stopped (04/26/22 0858)   vancomycin Stopped (04/26/22 0016)   PRN Meds:.sodium chloride, acetaminophen **OR** acetaminophen, albuterol, bisacodyl, fentaNYL (SUBLIMAZE) injection, hydrALAZINE, ondansetron **OR** ondansetron (ZOFRAN) IV, oxyCODONE, polyethylene glycol, sodium chloride flush, traZODone   Anti-infectives (From admission, onward)    Start     Dose/Rate Route Frequency Ordered Stop   04/27/22 0600  ceFEPIme (MAXIPIME) 2 g in sodium chloride 0.9 % 100 mL IVPB        2 g 200 mL/hr over 30 Minutes Intravenous Every 24 hours 04/26/22 1140     04/25/22 2200  vancomycin (VANCOREADY) IVPB 1500 mg/300 mL        1,500 mg 150 mL/hr over 120 Minutes Intravenous Every 48 hours 04/24/22 1618     04/25/22 0600  ceFEPIme (MAXIPIME)  2 g in sodium chloride 0.9 % 100 mL IVPB  Status:  Discontinued        2 g 200 mL/hr over 30 Minutes Intravenous Every 12 hours 04/24/22 1621 04/26/22 1140   04/24/22 1400  vancomycin (VANCOREADY) IVPB 2000 mg/400 mL        2,000 mg 200 mL/hr over 120 Minutes Intravenous  Once 04/24/22 1348 04/24/22 1737   04/24/22 1245  azithromycin (ZITHROMAX) 500 mg in sodium chloride 0.9 % 250 mL IVPB        500 mg 250 mL/hr over 60 Minutes Intravenous Every 24 hours 04/24/22 1237     04/24/22 1215  ceFEPIme (MAXIPIME) 2 g in sodium chloride 0.9 % 100 mL IVPB        2 g 200 mL/hr over 30 Minutes Intravenous  Once 04/24/22 1213 04/24/22 1418       Subjective: [name] today has no fevers, no emesis,  No chest pain,   Poor appetite, swallowing difficulties persist -Attempted to wean off warming blanket  patient became hypothermic -Attempted to wean off Levophed IV patient became hypotensive  Objective: Vitals:   04/26/22 0644 04/26/22 0802 04/26/22 1024 04/26/22 1209  BP: (!) 130/43     Pulse: 63     Resp: 11     Temp:  98.3 F (36.8 C)  97.9 F (36.6 C)  TempSrc:      SpO2: 99%  100%   Weight:      Height:        Intake/Output Summary (Last 24 hours) at 04/26/2022 1332 Last data filed at 04/26/2022 1207 Gross per 24 hour  Intake 3496.6 ml  Output 100 ml  Net 3396.6 ml   Filed Weights   04/24/22 1347 04/24/22 1800 04/25/22 0600  Weight: 102 kg 106.8 kg 108.3 kg    Physical Exam  Physical Examination: General appearance - alert,  in no distress  Mental status -somewhat sleepy and disoriented Eyes - sclera anicteric Neck - supple, no JVD elevation , Chest - clear  to auscultation bilaterally, symmetrical air movement,  Heart - S1 and S2 normal, regular , bradycardic Abdomen - soft, nontender, nondistended, +BS Neurological - -sleepy and disoriented, appears to be moving all extremities well spontaneously Extremities - +ve pedal edema noted, intact peripheral pulses  Skin - warm, dry    Data Reviewed: I have personally reviewed following labs and imaging studies  CBC: Recent Labs  Lab 04/24/22 1135 04/25/22 0455 04/26/22 0856  WBC 2.9* 4.3 5.0  NEUTROABS 2.3  --   --   HGB 9.7* 7.7* 7.2*  HCT 30.5* 24.1* 22.7*  MCV 86.2 87.0 88.3  PLT 79* 75* 75*   Basic Metabolic Panel: Recent Labs  Lab 04/24/22 1135 04/24/22 1241 04/25/22 0455 04/26/22 0856  NA 131*  --  134* 135  K 2.7*  --  3.9 3.6  CL 88*  --  92* 98  CO2 31  --  29 27  GLUCOSE 111*  --  85 166*  BUN 41*  --  39* 45*  CREATININE 1.70*  --  1.86* 2.61*  CALCIUM 8.3*  --  8.1* 8.4*  MG  --  1.4*  --   --   PHOS  --   --   --  4.4   GFR: Estimated Creatinine Clearance: 25 mL/min (A) (by C-G formula based on SCr of 2.61 mg/dL (H)). Liver Function Tests: Recent Labs  Lab 04/24/22 1135  04/26/22 0856  AST 22  --  ALT 19  --   ALKPHOS 293*  --   BILITOT 0.9  --   PROT 6.6  --   ALBUMIN 3.2* 2.9*   Recent Results (from the past 240 hour(s))  Culture, blood (Routine x 2)     Status: None (Preliminary result)   Collection Time: 04/24/22 11:35 AM   Specimen: BLOOD  Result Value Ref Range Status   Specimen Description BLOOD BLOOD LEFT HAND  Final   Special Requests   Final    BOTTLES DRAWN AEROBIC AND ANAEROBIC Blood Culture adequate volume   Culture   Final    NO GROWTH < 24 HOURS Performed at [facility], [address], [place], [place] [address]    Report Status PENDING  Incomplete  Urine Culture     Status: None   Collection Time: 04/24/22 11:50 AM   Specimen: Urine, Clean Catch  Result Value Ref Range Status   Specimen Description   Final    URINE, CLEAN CATCH Performed at [facility], [address] [place], [place] [address]    Special Requests   Final    NONE Performed at [facility], [address] [place], [place] [address]    Culture   Final    NO GROWTH Performed at [facility], [place] [address] [place], [place] [address]    Report Status 04/25/2022 FINAL  Final  MRSA Next Gen by PCR, Nasal     Status: Abnormal   Collection Time: 04/24/22  6:38 PM   Specimen: Nasal Mucosa; Nasal Swab  Result Value Ref Range Status   MRSA by PCR Next Gen DETECTED (A) NOT DETECTED Final    Comment:        The GeneXpert MRSA Assay (FDA approved for NASAL specimens only), is one component of a comprehensive MRSA colonization surveillance program. It is not intended to diagnose MRSA infection nor to guide or monitor treatment for MRSA infections. RESULT CALLED TO, READ BACK BY AND VERIFIED WITH: FERO,J @0025  BY MATTHEWS, B 2.8.2024 Performed at [facility], [address] [place],  [address]       Radiology Studies: DG Abd Portable 1V  Result Date: 04/26/2022 CLINICAL DATA:  Encounter for nasogastric tube placement. EXAM: PORTABLE  ABDOMEN - 1 VIEW COMPARISON:  CT 07/17/2021 FINDINGS: Nasogastric tube extends into the left upper abdomen. The tip is in the expected region of the gastric body. Again noted is a left subclavian Port-A-Cath with the tip in the left innominate vein. Elevation of the right hemidiaphragm. Bowel gas in the abdomen. IMPRESSION: 1. Nasogastric tube is in the left upper abdomen likely in the gastric body. 2. Stable appearance of the Port-A-Cath with the tip in the left innominate vein region. Electronically Signed   By: Markus Daft M.D.   On: 04/26/2022 12:13   CT Hip Right Wo Contrast  Result Date: 04/24/2022 CLINICAL DATA:  Metastatic rectal adenocarcinoma, hip trauma. Renal insufficiency. Radiation for rectal cancer. EXAM: CT OF THE RIGHT HIP WITHOUT CONTRAST TECHNIQUE: Multidetector CT imaging of the right hip was performed according to the standard protocol. Multiplanar CT image reconstructions were also generated. RADIATION DOSE REDUCTION: This exam was performed according to the departmental dose-optimization program which includes automated exposure control, adjustment of the mA and/or kV according to patient size and/or use of iterative reconstruction technique. COMPARISON:  CT pelvis 07/17/2021 and hip radiographs 04/24/2022 FINDINGS: Bones/Joint/Cartilage No discrete femoral neck fracture is readily apparent. There is spurring along the femoral head contributing to the  recent appearance at conventional radiography. Widespread scattered sclerosis and lucency in the bony pelvis, this is been present on numerous prior exams, some of this may reflect response to radiation therapy there is also suspected underlying diffuse idiopathic skeletal hyperostosis and possible renal osteodystrophy contributing. Paget's disease has been raised as a possibility for some of the widespread skeletal appearance, however the widespread distribution would be somewhat unusual for Paget's disease. In any case there is no substantial  change on today's exam compared 07/17/2021 to indicate an acute process. Chronically fragmented anterior acetabular osteophytes noted. No hip effusion. Ligaments Suboptimally assessed by CT. Muscles and Tendons Calcific tendinopathy along the proximal hamstring tendons. Soft tissues Foley catheter in the urinary bladder. Continued soft tissue density in the vicinity of the rectum or rectal pouch, some of this may be treatment related in this is only partially included on today's exam. IMPRESSION: 1. No femoral neck fracture is readily apparent. There is spurring along the femoral head contributing to the recent appearance at conventional radiography. 2. Widespread scattered sclerosis and lucency in the bony pelvis, this is been present on numerous prior exams, some of this may reflect response to radiation therapy. There is also suspected underlying diffuse idiopathic skeletal hyperostosis and possible renal osteodystrophy contributing. Paget's disease has been raised as a possibility for some of the widespread skeletal appearance, however the widespread distribution would be somewhat unusual for Paget's disease. In any case there is no substantial change in the right femur or right hemipelvis on today's exam compared to 07/17/2021 to indicate an acute process. 3. Calcific tendinopathy along the proximal hamstring tendons. 4. Continued soft tissue density in the vicinity of the rectum or rectal pouch, some of this may be treatment related in this is only partially included on today's exam. Electronically Signed   By: Van Clines M.D.   On: 04/24/2022 17:45   CT Head Wo Contrast  Result Date: 04/24/2022 CLINICAL DATA:  Head trauma.  Intracranial injury suspected. EXAM: CT HEAD WITHOUT CONTRAST TECHNIQUE: Contiguous axial images were obtained from the base of the skull through the vertex without intravenous contrast. RADIATION DOSE REDUCTION: This exam was performed according to the departmental  dose-optimization program which includes automated exposure control, adjustment of the mA and/or kV according to patient size and/or use of iterative reconstruction technique. COMPARISON:  MRI 11/19/2005 FINDINGS: Brain: Age related atrophy. No evidence of old or acute focal infarction, mass lesion, hemorrhage, hydrocephalus or extra-axial collection. Vascular: No abnormal vascular finding. Skull: Normal Sinuses/Orbits: Clear/normal Other: None IMPRESSION: No acute or traumatic finding. Age related atrophy. Electronically Signed   By: Nelson Chimes M.D.   On: 04/24/2022 17:39     Scheduled Meds:  allopurinol  200 mg Oral Daily   aspirin EC  81 mg Oral Daily   calcium carbonate  2 tablet Oral BID   Chlorhexidine Gluconate Cloth  6 each Topical Q0600   donepezil  10 mg Oral Daily   escitalopram  10 mg Oral Daily   feeding supplement (GLUCERNA SHAKE)  237 mL Oral QID   ferrous sulfate  325 mg Oral Q breakfast   folic acid  1 mg Oral Daily   free water  250 mL Per Tube QID   insulin aspart  0-5 Units Subcutaneous QHS   insulin aspart  0-6 Units Subcutaneous TID WC   levothyroxine  150 mcg Oral Q0600   mometasone-formoterol  2 puff Inhalation BID   mupirocin ointment  1 Application Nasal BID   pantoprazole  40 mg Oral Daily   [START ON 04/27/2022] phenytoin  60 mg Oral Daily   pravastatin  40 mg Oral q1800   senna-docusate  2 tablet Oral QHS   sodium chloride flush  3 mL Intravenous Q12H   sodium chloride flush  3 mL Intravenous Q12H   Continuous Infusions:  sodium chloride     azithromycin 500 mg (04/26/22 1252)   [START ON 04/27/2022] ceFEPime (MAXIPIME) IV     dextrose 5 % and 0.9% NaCl 150 mL/hr at 04/26/22 1207   norepinephrine (LEVOPHED) Adult infusion Stopped (04/26/22 0858)   vancomycin Stopped (04/26/22 0016)    LOS: 2 days   Roxan Hockey M.D on 04/26/2022 at 1:32 PM  Go to [email or web address] - for contact info  [facility] - Office  [phone]  If 7PM-7AM, please  contact night-coverage [email or web address] 04/26/2022, 1:32 PM

## 2022-04-26 NOTE — Progress Notes (Signed)
Speech Language Pathology Treatment: Dysphagia  Patient Details Name: [name] MRN: [id number] DOB: December 19, 1950 Today's Date: 04/26/2022 Time: 9735-3299 SLP Time Calculation (min) (ACUTE ONLY): 23 min  Assessment / Plan / Recommendation Clinical Impression  Pt provided ongoing diagnostic dysphagia therapy; Pt continues with tongue outside of her oral cavity this morning and was less responsive/able to move it laterally with cues. Pt continues to present with a suspected cognitive based dysphagia. Pt with one unintelligible verbalization during my session and Pt only briefly and occasionally opened her eyes only to almost immediately close them again. SLP moistened tongue with an ice chip with little to no response - melted ice dripped from lingual surface onto Pt's chest. SLP roused Pt briefly to provide a 1/2 tsp of ice cream, Pt initially moved bolus posteriorly on her tongue and then stopped just allowing remaining bolus to sit on lingual surface. Despite max verbal and tactile cues, no swallow was observed throughout session. Bolus/trial was ultimately removed from oral cavity. Pt is not currently safe/appropriate for a PO diet; recommend consider temporary alternative means of nutrition and recommend alternative means for medications also. ST will continue to follow for initiation of PO diet. Thank you,    HPI HPI: 72 year old woman brought in by EMS from [facility] facility due to hypothermia temperature 92.2 and bradycardia in the 40s.  By report, she was hospitalized for pneumonia at 10 days ago treated with antibiotics, was diagnosed with UTI at the facility 1 day PTA and started on antibiotic.  ED PA obtained history of recent fall and she complained of right hip and left shoulder pain  She was placed on the warmer and temperature improved from 89.1 on arrival to 92.3, remained bradycardic in the 40s, normotensive  Initial labs significant for hyponatremia 131, hypokalemia 2.7,  hypomagnesemia 1.4, AKI BUN/creatinine 41/1.7, leukopenia with stable anemia and mild thrombocytopenia  UA showed trace LE with 6-10 white cells and many bacteria  Chest x-ray showed patchy right upper lobe infiltrate with moderate right pleural effusion versus elevated right hemidiaphragm and small left effusion. BSE requested due to nursing reports that Pt is holding food in her mouth.      SLP Plan  Continue with current plan of care      Recommendations for follow up therapy are one component of a multi-disciplinary discharge planning process, led by the attending physician.  Recommendations may be updated based on patient status, additional functional criteria and insurance authorization.    Recommendations  Diet recommendations: NPO Medication Administration: Via alternative means                Follow Up Recommendations: Follow physician's recommendations for discharge plan and follow up therapies SLP Visit Diagnosis: Dysphagia, unspecified (R13.10) Plan: Continue with current plan of care         [name] H. Roddie Mc, CCC-SLP Speech Language Pathologist   [name]  04/26/2022, 9:34 AM

## 2022-04-26 NOTE — Progress Notes (Signed)
Pt is restless and unable to follow commands on my initial assessment this morning. When trying to administer her daily medications I attempted to give her one at a time followed by sips of water. She held the fluid and pill in her mouth for 5 seconds before spitting everything out. One more attempt was made and I tried stimulating her swallow reflex but the same result occurred. Her IV pain medication was given instead of the PO. MD made aware.

## 2022-04-27 ENCOUNTER — Inpatient Hospital Stay (HOSPITAL_COMMUNITY): Payer: 59

## 2022-04-27 DIAGNOSIS — I10 Essential (primary) hypertension: Secondary | ICD-10-CM | POA: Diagnosis not present

## 2022-04-27 DIAGNOSIS — E1165 Type 2 diabetes mellitus with hyperglycemia: Secondary | ICD-10-CM | POA: Diagnosis not present

## 2022-04-27 DIAGNOSIS — A419 Sepsis, unspecified organism: Secondary | ICD-10-CM | POA: Diagnosis not present

## 2022-04-27 DIAGNOSIS — R6521 Severe sepsis with septic shock: Secondary | ICD-10-CM

## 2022-04-27 LAB — RENAL FUNCTION PANEL
Albumin: 3.1 g/dL — ABNORMAL LOW (ref 3.5–5.0)
Anion gap: 16 — ABNORMAL HIGH (ref 5–15)
BUN: 49 mg/dL — ABNORMAL HIGH (ref 8–23)
CO2: 22 mmol/L (ref 22–32)
Calcium: 8.8 mg/dL — ABNORMAL LOW (ref 8.9–10.3)
Chloride: 99 mmol/L (ref 98–111)
Creatinine, Ser: 3.1 mg/dL — ABNORMAL HIGH (ref 0.44–1.00)
GFR, Estimated: 16 mL/min — ABNORMAL LOW (ref >60)
Glucose, Bld: 145 mg/dL — ABNORMAL HIGH (ref 70–99)
Phosphorus: 4.4 mg/dL (ref 2.5–4.6)
Potassium: 3.1 mmol/L — ABNORMAL LOW (ref 3.5–5.1)
Sodium: 137 mmol/L (ref 135–145)

## 2022-04-27 LAB — HEMOGLOBIN AND HEMATOCRIT, BLOOD
HCT: 28.2 % — ABNORMAL LOW (ref 36.0–46.0)
Hemoglobin: 9.1 g/dL — ABNORMAL LOW (ref 12.0–15.0)

## 2022-04-27 LAB — T3, FREE: T3, Free: 1.4 pg/mL — ABNORMAL LOW (ref 2.0–4.4)

## 2022-04-27 MED ORDER — FERROUS SULFATE 300 (60 FE) MG/5ML PO SOLN
300.0000 mg | Freq: Every day | ORAL | Status: DC
  Administered 2022-04-27 – 2022-04-30 (×4): 300 mg
  Filled 2022-04-27 (×5): qty 5

## 2022-04-27 MED ORDER — LABETALOL HCL 5 MG/ML IV SOLN
10.0000 mg | INTRAVENOUS | Status: DC | PRN
  Administered 2022-04-27 – 2022-04-28 (×3): 10 mg via INTRAVENOUS
  Filled 2022-04-27 (×3): qty 4

## 2022-04-27 NOTE — Progress Notes (Addendum)
PROGRESS NOTE     [name], is a 72 y.o. female, DOB - [birth year], KGM:[id number]  Admit date - 04/24/2022   Admitting Physician Joclynn Lumb Denton Brick, MD  Outpatient Primary MD for the patient is Watson, [name]  LOS - 3  Chief Complaint  Patient presents with   Altered Mental Status        Brief Narrative:  - 72 y.o. 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, status post prior chemo and radiation therapy, CKD 3A, chronic anemia,  chronic thrombocytopenia, history of seizures, hypertension, DM, obesity and B12 deficiency     -Assessment and Plan: 1)Severe sepsis with septic shock - secondary to presumed right-sided pneumonia and UTI---- 04/27/22 -Weaned off IV Levophed --  -Weaned off warming blanket --c/n with IV fluids, IV Vanco, azithromycin and cefepime pending further culture data -Blood and urine cultures NGTD   2) acute metabolic encephalopathy--- due to #1 above Additional history obtained from state appointed legal guardian at bedside [name] -Patient apparently is usually conversational and interactive ambulates short distances with a walker long distances with wheelchair until recently, -Treat #1 above   3) acute on chronic anemia and Thrombocytopenia-- longstanding history of chronic anemia and chronic thrombocytopenia  --Patient recently got iron infusions around 03/25/2022, she was scheduled to start getting Aranesp shots soon -- hemoglobin is 9.7 hematocrit is 30.5 and platelets are 79 (platelets were 145 on 04/09/2022) -Hgb was 7.9 on 03/25/2022 and patient received iron infusion at the time -Serum iron is 91 -Folate is low at 4.3 -Previously 04/24/2022 with severe sepsis with septic shock B12 replacements -c/n folic acid 6/44/03 -Hgb up to 9.1 from 7.2 after transfusion of 1 unit of PRBC on 04/26/2022 -No acute bleeding concerns at this time  4) hypokalemia/hyponatremia/hypochloremia/hypomagnesemia--   -No vomiting or diarrhea -Replace and recheck   5) hypothyroidism---TSH is 12.95 -May be contributing to hypothermia -Check T3  -T4 is 0.69 which is borderline low normal -Start levothyroxine   6)Aki on CKD stage -3A   --- -Creatinine trending up due to hypotension and dehydration in the setting of severe sepsis with septic shock -Creatinine continues to trend up most likely due to recent hemodynamic instability -renally adjust medications, avoid nephrotoxic agents / dehydration  / hypotension   7)Stage IV Rectal Cancer with oligometastatic to the lung, status post prior chemo and radiation therapy----recent surveillance showed no evidence of recurrence -Patient follows with Dr. Delton Coombes   8) history of seizures--- no recent seizures reported -Elevated Dilantin level was noted -Pharmacist adjusted dilantin   9)Bradycardia----suspect related to hypothermia and metoprolol use -Hold metoprolol -Heart rate Improving   10)DM2- Use Novolog/Humalog Sliding scale insulin with Accu-Cheks/Fingersticks as ordered    11)Social/Ethics-discussed with state appointed legal guardian [name] -Full code for now to be addressed with his supervisor later  12) Dilantin toxicity--- -Elevated Dilantin level was noted -Pharmacist adjusted dilantin pharmacy consult for adjustment of Dilantin  13)Dysphagia--- speech pathologist evaluation appreciated, recommends n.p.o. status for now -NG tube placed on 04/26/2022 to allow for patient administration and feeding and free water administration  CRITICAL CARE Performed by: Roxan Hockey   Total critical care time: 48 minutes  Critical care time was exclusive of separately billable procedures and treating other patients.  Critical care was necessary to treat or prevent imminent or life-threatening deterioration. - -Successfully weaned off Levophed drip  Critical care was time spent personally by me on the following activities:  development of treatment  plan with patient and/or surrogate as well as nursing, discussions with consultants, evaluation of patient's response to treatment, examination of patient, obtaining history from patient or surrogate, ordering and performing treatments and interventions, ordering and review of laboratory studies, ordering and review of radiographic studies, pulse oximetry and re-evaluation of patient's condition.  Status is: Inpatient   Remains inpatient appropriate because:    Disposition: The patient is from: SNF              Anticipated d/c is to: SNF              Anticipated d/c date is: > 3 days              Patient currently is not medically stable to d/c. Barriers: Not Clinically Stable-   Code Status :  -  Code Status: Full Code   Family Communication:   Discussed with  state appointed legal guardian [name]  DVT Prophylaxis  :   - SCDs  SCDs Start: 04/24/22 1834 Place TED hose Start: 04/24/22 1834   Lab Results  Component Value Date   PLT 75 (L) 04/26/2022    Inpatient Medications  Scheduled Meds:  allopurinol  200 mg Per Tube Daily   aspirin EC  81 mg Oral Daily   calcium carbonate  2 tablet Per Tube BID   Chlorhexidine Gluconate Cloth  6 each Topical Q0600   donepezil  10 mg Per Tube Daily   escitalopram  10 mg Per Tube Daily   feeding supplement (GLUCERNA SHAKE)  237 mL Oral QID   ferrous sulfate  300 mg Per Tube Q breakfast   folic acid  1 mg Per Tube Daily   free water  250 mL Per Tube QID   insulin aspart  0-5 Units Subcutaneous QHS   insulin aspart  0-6 Units Subcutaneous TID WC   levothyroxine  150 mcg Per Tube Q0600   mometasone-formoterol  2 puff Inhalation BID   mupirocin ointment  1 Application Nasal BID   pantoprazole  40 mg Oral Daily   phenytoin  60 mg Oral Daily   pravastatin  40 mg Per Tube q1800   senna-docusate  2 tablet Per Tube QHS   sodium chloride flush  3 mL Intravenous Q12H   sodium chloride flush  3 mL Intravenous  Q12H   Continuous Infusions:  sodium chloride     sodium chloride 100 mL/hr at 04/27/22 5638   azithromycin 500 mg (04/27/22 1614)   ceFEPime (MAXIPIME) IV 2 g (04/27/22 0521)   PRN Meds:.sodium chloride, acetaminophen **OR** acetaminophen, albuterol, bisacodyl, fentaNYL (SUBLIMAZE) injection, hydrALAZINE, labetalol, ondansetron **OR** ondansetron (ZOFRAN) IV, oxyCODONE, polyethylene glycol, sodium chloride flush, traZODone   Anti-infectives (From admission, onward)    Start     Dose/Rate Route Frequency Ordered Stop   04/27/22 0600  ceFEPIme (MAXIPIME) 2 g in sodium chloride 0.9 % 100 mL IVPB        2 g 200 mL/hr over 30 Minutes Intravenous Every 24 hours 04/26/22 1140     04/25/22 2200  vancomycin (VANCOREADY) IVPB 1500 mg/300 mL  Status:  Discontinued        1,500 mg 150 mL/hr over 120 Minutes Intravenous Every 48 hours 04/24/22 1618 04/27/22 1500   04/25/22 0600  ceFEPIme (MAXIPIME) 2 g in sodium chloride 0.9 % 100 mL IVPB  Status:  Discontinued        2 g 200 mL/hr over 30 Minutes Intravenous Every 12 hours 04/24/22 1621 04/26/22 1140  04/24/22 1400  vancomycin (VANCOREADY) IVPB 2000 mg/400 mL        2,000 mg 200 mL/hr over 120 Minutes Intravenous  Once 04/24/22 1348 04/24/22 1737   04/24/22 1245  azithromycin (ZITHROMAX) 500 mg in sodium chloride 0.9 % 250 mL IVPB        500 mg 250 mL/hr over 60 Minutes Intravenous Every 24 hours 04/24/22 1237     04/24/22 1215  ceFEPIme (MAXIPIME) 2 g in sodium chloride 0.9 % 100 mL IVPB        2 g 200 mL/hr over 30 Minutes Intravenous  Once 04/24/22 1213 04/24/22 1418       Subjective: [name] today has no fevers, no emesis,  No chest pain,   -Swallowing difficulties persist -Metabolic encephalopathy/altered mentation persist -  Objective: Vitals:   04/27/22 1300 04/27/22 1830 04/27/22 1900 04/27/22 1930  BP: (!) 196/83 (!) 208/74  137/72  Pulse: 94 72 (!) 59 (!) 57  Resp: (!) 24 (!) 23 (!) 24 (!) 21  Temp:       TempSrc:      SpO2: 97% 99% 100% 98%  Weight:      Height:        Intake/Output Summary (Last 24 hours) at 04/27/2022 1758 Last data filed at 04/27/2022 9604 Gross per 24 hour  Intake 1168.32 ml  Output --  Net 1168.32 ml   Filed Weights   04/24/22 1347 04/24/22 1800 04/25/22 0600  Weight: 102 kg 106.8 kg 108.3 kg    Physical Exam  Physical Examination: General appearance - alert,  in no distress  Mental status -somewhat sleepy and disoriented Eyes - sclera anicteric Neck - supple, no JVD elevation , Chest - clear  to auscultation bilaterally, symmetrical air movement,  Heart - S1 and S2 normal, regular , bradycardic Abdomen - soft, nontender, nondistended, +BS, colostomy in situ Neurological - -sleepy and disoriented, appears to be moving all extremities well spontaneously Extremities - +ve pedal edema noted, intact peripheral pulses  Skin - warm, dry    Data Reviewed: I have personally reviewed following labs and imaging studies  CBC: Recent Labs  Lab 04/24/22 1135 04/25/22 0455 04/26/22 0856 04/27/22 0700  WBC 2.9* 4.3 5.0  --   NEUTROABS 2.3  --   --   --   HGB 9.7* 7.7* 7.2* 9.1*  HCT 30.5* 24.1* 22.7* 28.2*  MCV 86.2 87.0 88.3  --   PLT 79* 75* 75*  --    Basic Metabolic Panel: Recent Labs  Lab 04/24/22 1135 04/24/22 1241 04/25/22 0455 04/26/22 0856 04/27/22 0914  NA 131*  --  134* 135 137  K 2.7*  --  3.9 3.6 3.1*  CL 88*  --  92* 98 99  CO2 31  --  29 27 22   GLUCOSE 111*  --  85 166* 145*  BUN 41*  --  39* 45* 49*  CREATININE 1.70*  --  1.86* 2.61* 3.10*  CALCIUM 8.3*  --  8.1* 8.4* 8.8*  MG  --  1.4*  --   --   --   PHOS  --   --   --  4.4 4.4   GFR: Estimated Creatinine Clearance: 21 mL/min (A) (by C-G formula based on SCr of 3.1 mg/dL (H)). Liver Function Tests: Recent Labs  Lab 04/24/22 1135 04/26/22 0856 04/27/22 0914  AST 22  --   --   ALT 19  --   --   ALKPHOS 293*  --   --  BILITOT 0.9  --   --   PROT 6.6  --   --    ALBUMIN 3.2* 2.9* 3.1*   Recent Results (from the past 240 hour(s))  Culture, blood (Routine x 2)     Status: None (Preliminary result)   Collection Time: 04/24/22 11:35 AM   Specimen: BLOOD  Result Value Ref Range Status   Specimen Description BLOOD BLOOD LEFT HAND  Final   Special Requests   Final    BOTTLES DRAWN AEROBIC AND ANAEROBIC Blood Culture adequate volume   Culture   Final    NO GROWTH 3 DAYS Performed at [facility], [address], [place], [place] [address]    Report Status PENDING  Incomplete  Urine Culture     Status: None   Collection Time: 04/24/22 11:50 AM   Specimen: Urine, Clean Catch  Result Value Ref Range Status   Specimen Description   Final    URINE, CLEAN CATCH Performed at [facility], [address], [place], [place] [address]    Special Requests   Final    NONE Performed at [facility], [address] [place], [place] [address]    Culture   Final    NO GROWTH Performed at [facility], [place] [address], [place], [place] [address]    Report Status 04/25/2022 FINAL  Final  MRSA Next Gen by PCR, Nasal     Status: Abnormal   Collection Time: 04/24/22  6:38 PM   Specimen: Nasal Mucosa; Nasal Swab  Result Value Ref Range Status   MRSA by PCR Next Gen DETECTED (A) NOT DETECTED Final    Comment:        The GeneXpert MRSA Assay (FDA approved for NASAL specimens only), is one component of a comprehensive MRSA colonization surveillance program. It is not intended to diagnose MRSA infection nor to guide or monitor treatment for MRSA infections. RESULT CALLED TO, READ BACK BY AND VERIFIED WITH: FERO,J @0025  BY MATTHEWS, B 2.8.2024 Performed at [facility], [address] [place],  [address]       Radiology Studies: DG CHEST PORT 1 VIEW  Result Date: 04/27/2022 CLINICAL DATA:  72 year old female status post nasogastric tube placement. EXAM: PORTABLE CHEST 1 VIEW COMPARISON:  Chest x-ray 04/24/2022. FINDINGS: Nasogastric tube  extends into the distal body of the stomach. Left-sided subclavian single-lumen power porta cath with tip terminating in the region of the innominate vein. Stomach is incompletely imaged, lung volumes are very low. Bibasilar opacities which may reflect areas of atelectasis and/or consolidation. Large right and small left pleural effusions. Diffuse interstitial prominence and patchy ill-defined airspace disease noted throughout both lungs. Heart size is normal. Upper mediastinal contours are within normal limits. IMPRESSION: 1. Support apparatus, as above. 2. Large right and small left pleural effusions with bibasilar opacities which may reflect areas of atelectasis and/or consolidation. 3. Marked worsening aeration in the lungs, concerning for progressive multilobar bilateral pneumonia. Electronically Signed   By: Vinnie Langton M.D.   On: 04/27/2022 08:44   DG Abd Portable 1V  Result Date: 04/26/2022 CLINICAL DATA:  Encounter for nasogastric tube placement. EXAM: PORTABLE ABDOMEN - 1 VIEW COMPARISON:  CT 07/17/2021 FINDINGS: Nasogastric tube extends into the left upper abdomen. The tip is in the expected region of the gastric body. Again noted is a left subclavian Port-A-Cath with the tip in the left innominate vein. Elevation of the right hemidiaphragm. Bowel gas in the abdomen. IMPRESSION: 1. Nasogastric tube is in the left  upper abdomen likely in the gastric body. 2. Stable appearance of the Port-A-Cath with the tip in the left innominate vein region. Electronically Signed   By: Markus Daft M.D.   On: 04/26/2022 12:13     Scheduled Meds:  allopurinol  200 mg Per Tube Daily   aspirin EC  81 mg Oral Daily   calcium carbonate  2 tablet Per Tube BID   Chlorhexidine Gluconate Cloth  6 each Topical Q0600   donepezil  10 mg Per Tube Daily   escitalopram  10 mg Per Tube Daily   feeding supplement (GLUCERNA SHAKE)  237 mL Oral QID   ferrous sulfate  300 mg Per Tube Q breakfast   folic acid  1 mg Per Tube  Daily   free water  250 mL Per Tube QID   insulin aspart  0-5 Units Subcutaneous QHS   insulin aspart  0-6 Units Subcutaneous TID WC   levothyroxine  150 mcg Per Tube Q0600   mometasone-formoterol  2 puff Inhalation BID   mupirocin ointment  1 Application Nasal BID   pantoprazole  40 mg Oral Daily   phenytoin  60 mg Oral Daily   pravastatin  40 mg Per Tube q1800   senna-docusate  2 tablet Per Tube QHS   sodium chloride flush  3 mL Intravenous Q12H   sodium chloride flush  3 mL Intravenous Q12H   Continuous Infusions:  sodium chloride     sodium chloride 100 mL/hr at 04/27/22 0272   azithromycin 500 mg (04/27/22 1614)   ceFEPime (MAXIPIME) IV 2 g (04/27/22 0521)    LOS: 3 days   Roxan Hockey M.D on 04/27/2022 at 5:58 PM  Go to [email or web address] - for contact info  [facility] - Office  [phone]  If 7PM-7AM, please contact night-coverage [email or web address] 04/27/2022, 5:58 PM

## 2022-04-28 ENCOUNTER — Inpatient Hospital Stay (HOSPITAL_COMMUNITY): Payer: 59

## 2022-04-28 DIAGNOSIS — E1165 Type 2 diabetes mellitus with hyperglycemia: Secondary | ICD-10-CM | POA: Diagnosis not present

## 2022-04-28 DIAGNOSIS — I10 Essential (primary) hypertension: Secondary | ICD-10-CM | POA: Diagnosis not present

## 2022-04-28 DIAGNOSIS — R0609 Other forms of dyspnea: Secondary | ICD-10-CM

## 2022-04-28 DIAGNOSIS — D61818 Other pancytopenia: Secondary | ICD-10-CM | POA: Diagnosis not present

## 2022-04-28 DIAGNOSIS — N1831 Chronic kidney disease, stage 3a: Secondary | ICD-10-CM | POA: Diagnosis not present

## 2022-04-28 LAB — TYPE AND SCREEN
ABO/RH(D): A POS
Antibody Screen: NEGATIVE
Unit division: 0

## 2022-04-28 LAB — ECHOCARDIOGRAM COMPLETE
Area-P 1/2: 4.96 cm2
Height: 66 in
S' Lateral: 2.9 cm
Weight: 4067.05 oz

## 2022-04-28 LAB — CBC
HCT: 31.3 % — ABNORMAL LOW (ref 36.0–46.0)
Hemoglobin: 9.9 g/dL — ABNORMAL LOW (ref 12.0–15.0)
MCH: 27.7 pg (ref 26.0–34.0)
MCHC: 31.6 g/dL (ref 30.0–36.0)
MCV: 87.4 fL (ref 80.0–100.0)
Platelets: 67 10*3/uL — ABNORMAL LOW (ref 150–400)
RBC: 3.58 MIL/uL — ABNORMAL LOW (ref 3.87–5.11)
RDW: 17.8 % — ABNORMAL HIGH (ref 11.5–15.5)
WBC: 8.9 10*3/uL (ref 4.0–10.5)
nRBC: 0.9 % — ABNORMAL HIGH (ref 0.0–0.2)

## 2022-04-28 LAB — BASIC METABOLIC PANEL
Anion gap: 14 (ref 5–15)
BUN: 49 mg/dL — ABNORMAL HIGH (ref 8–23)
CO2: 22 mmol/L (ref 22–32)
Calcium: 9.2 mg/dL (ref 8.9–10.3)
Chloride: 101 mmol/L (ref 98–111)
Creatinine, Ser: 2.97 mg/dL — ABNORMAL HIGH (ref 0.44–1.00)
GFR, Estimated: 16 mL/min — ABNORMAL LOW (ref >60)
Glucose, Bld: 164 mg/dL — ABNORMAL HIGH (ref 70–99)
Potassium: 3.4 mmol/L — ABNORMAL LOW (ref 3.5–5.1)
Sodium: 137 mmol/L (ref 135–145)

## 2022-04-28 LAB — BPAM RBC
Blood Product Expiration Date: 202403042359
ISSUE DATE / TIME: 202402092352
Unit Type and Rh: 6200

## 2022-04-28 MED ORDER — POTASSIUM CHLORIDE 10 MEQ/100ML IV SOLN
10.0000 meq | INTRAVENOUS | Status: AC
  Administered 2022-04-28 (×4): 10 meq via INTRAVENOUS
  Filled 2022-04-28 (×4): qty 100

## 2022-04-28 MED ORDER — PHENYTOIN 50 MG PO CHEW
25.0000 mg | CHEWABLE_TABLET | Freq: Three times a day (TID) | ORAL | Status: DC
  Administered 2022-04-28 – 2022-04-30 (×6): 25 mg
  Filled 2022-04-28 (×9): qty 0.5

## 2022-04-28 MED ORDER — ASPIRIN 81 MG PO CHEW
81.0000 mg | CHEWABLE_TABLET | Freq: Every day | ORAL | Status: AC
  Administered 2022-04-28 – 2022-04-29 (×2): 81 mg
  Filled 2022-04-28 (×2): qty 1

## 2022-04-28 MED ORDER — PANTOPRAZOLE SODIUM 40 MG IV SOLR
40.0000 mg | INTRAVENOUS | Status: DC
  Administered 2022-04-28 – 2022-04-29 (×2): 40 mg via INTRAVENOUS
  Filled 2022-04-28 (×2): qty 10

## 2022-04-28 NOTE — Progress Notes (Signed)
This RN and tech tonight has been having trouble getting pt's temperature all night. Pt has temp foley but cannot get her temp thru it even with attempt to change temp foley cord multiple times. Placed warm blankets and after some time got a temp per axillary of 96.8. Placed pt on bair hugger and tried putting in rectal probe but this RN together with another RN and tech was not able to. MD informed.

## 2022-04-28 NOTE — Progress Notes (Signed)
Have tried multiple times and multiple places on patients body to get a temperature reading.  All attempts have failed and I notified Dr. Denton Brick he is aware. Foley removed per order and purewick placed. Patient has not awaken enough to give any type of food product nor water.

## 2022-04-28 NOTE — Progress Notes (Signed)
PROGRESS NOTE     [name], is a 72 y.o. female, DOB - 08/16/1950, MWU:[id number]  Admit date - 04/24/2022   Admitting Physician Keshawn Sundberg Denton Brick, MD  Outpatient Primary MD for the patient is [place], [name]  LOS - 4  Chief Complaint  Patient presents with   Altered Mental Status        Brief Narrative:  - 72 y.o. 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, status post prior chemo and radiation therapy, CKD 3A, chronic anemia,  chronic thrombocytopenia, history of seizures, hypertension, DM, obesity and B12 deficiency     -Assessment and Plan: 1)Severe sepsis with septic shock - secondary to presumed right-sided pneumonia and UTI---- 04/28/22 -Weaned off IV Levophed --  -Weaned off warming blanket --c/n with IV fluids, IV Vanco, azithromycin and cefepime pending further culture data -Blood and urine cultures NGTD -Leukopenia has resolved, WBC has normalized   2) acute metabolic encephalopathy--- due to #1 above Additional history obtained from state appointed legal guardian at bedside [name] -Patient apparently is usually conversational and interactive ambulates short distances with a walker long distances with wheelchair until recently, -Treat #1 above   3) acute on chronic anemia and Thrombocytopenia-- longstanding history of chronic anemia and chronic thrombocytopenia  --Patient recently got iron infusions around 03/25/2022, she was scheduled to start getting Aranesp shots soon -platelets were 145 on 04/09/2022) -Hgb was 7.9 on 03/25/2022 and patient received iron infusion at the time -Serum iron is 91 -Folate is low at 4.3 -c/n folic acid 10/10/34 -Hgb greater than 9 from 7.2 after transfusion of 1 unit of PRBC on 04/26/2022 -Platelets pending -No acute bleeding concerns at this time  4) hypokalemia/hyponatremia/hypochloremia/hypomagnesemia--  -No vomiting or diarrhea -Replace and recheck   5)  hypothyroidism---TSH is 12.95 -May be contributing to hypothermia -T3 is low at 1.4 -T4 is 0.69 which is borderline low normal -c/n levothyroxine   6)Aki on CKD stage -3A   --- -Creatinine trending up due to hypotension and dehydration in the setting of severe sepsis with septic shock -Creatinine continues to trend up most likely due to recent hemodynamic instability -renally adjust medications, avoid nephrotoxic agents / dehydration  / hypotension   7)Stage IV Rectal Cancer with oligometastatic to the lung, status post prior chemo and radiation therapy----recent surveillance showed no evidence of recurrence -Patient follows with Dr. Delton Coombes   8) history of seizures--- no recent seizures reported -Elevated Dilantin level was noted -Pharmacist adjusted dilantin   9)Bradycardia----suspect related to hypothermia and metoprolol use -Hold metoprolol -Heart rate Improving   10)DM2- Use Novolog/Humalog Sliding scale insulin with Accu-Cheks/Fingersticks as ordered    11)Social/Ethics-discussed with state appointed legal guardian [name] -Full code for now to be addressed with his supervisor later  12) Dilantin toxicity--- -Elevated Dilantin level was noted -Pharmacist adjusted dilantin pharmacy consult for adjustment of Dilantin  13)Dysphagia--- speech pathologist evaluation appreciated, recommends n.p.o. status for now -NG tube placed on 04/26/2022 to allow for patient administration and feeding and free water administration  CRITICAL CARE Performed by: Roxan Hockey   Total critical care time: 44 minutes  Critical care time was exclusive of separately billable procedures and treating other patients.  Critical care was necessary to treat or prevent imminent or life-threatening deterioration. - -Successfully weaned off Levophed drip  Critical care was time spent personally by me on the following activities: development of treatment plan with patient and/or surrogate  as well as nursing, discussions with  consultants, evaluation of patient's response to treatment, examination of patient, obtaining history from patient or surrogate, ordering and performing treatments and interventions, ordering and review of laboratory studies, ordering and review of radiographic studies, pulse oximetry and re-evaluation of patient's condition.  Status is: Inpatient   Remains inpatient appropriate because:    Disposition: The patient is from: SNF              Anticipated d/c is to: SNF              Anticipated d/c date is: > 3 days              Patient currently is not medically stable to d/c. Barriers: Not Clinically Stable-   Code Status :  -  Code Status: Full Code   Family Communication:   Discussed with  state appointed legal guardian [name]  DVT Prophylaxis  :   - SCDs  SCDs Start: 04/24/22 1834 Place TED hose Start: 04/24/22 1834   Lab Results  Component Value Date   PLT 67 (L) 04/28/2022    Inpatient Medications  Scheduled Meds:  allopurinol  200 mg Per Tube Daily   aspirin  81 mg Per Tube Daily   aspirin EC  81 mg Oral Daily   calcium carbonate  2 tablet Per Tube BID   Chlorhexidine Gluconate Cloth  6 each Topical Q0600   donepezil  10 mg Per Tube Daily   escitalopram  10 mg Per Tube Daily   feeding supplement (GLUCERNA SHAKE)  237 mL Oral QID   ferrous sulfate  300 mg Per Tube Q breakfast   folic acid  1 mg Per Tube Daily   free water  250 mL Per Tube QID   insulin aspart  0-5 Units Subcutaneous QHS   insulin aspart  0-6 Units Subcutaneous TID WC   levothyroxine  150 mcg Per Tube Q0600   mometasone-formoterol  2 puff Inhalation BID   mupirocin ointment  1 Application Nasal BID   pantoprazole (PROTONIX) IV  40 mg Intravenous Q24H   phenytoin  25 mg Per Tube TID   pravastatin  40 mg Per Tube q1800   senna-docusate  2 tablet Per Tube QHS   sodium chloride flush  3 mL Intravenous Q12H   sodium chloride flush  3 mL Intravenous Q12H    Continuous Infusions:  sodium chloride     sodium chloride 100 mL/hr at 04/28/22 0539   azithromycin 500 mg (04/28/22 1434)   ceFEPime (MAXIPIME) IV 2 g (04/28/22 0541)   PRN Meds:.sodium chloride, acetaminophen **OR** acetaminophen, albuterol, bisacodyl, fentaNYL (SUBLIMAZE) injection, hydrALAZINE, labetalol, ondansetron **OR** ondansetron (ZOFRAN) IV, oxyCODONE, polyethylene glycol, sodium chloride flush, traZODone   Anti-infectives (From admission, onward)    Start     Dose/Rate Route Frequency Ordered Stop   04/27/22 0600  ceFEPIme (MAXIPIME) 2 g in sodium chloride 0.9 % 100 mL IVPB        2 g 200 mL/hr over 30 Minutes Intravenous Every 24 hours 04/26/22 1140     04/25/22 2200  vancomycin (VANCOREADY) IVPB 1500 mg/300 mL  Status:  Discontinued        1,500 mg 150 mL/hr over 120 Minutes Intravenous Every 48 hours 04/24/22 1618 04/27/22 1500   04/25/22 0600  ceFEPIme (MAXIPIME) 2 g in sodium chloride 0.9 % 100 mL IVPB  Status:  Discontinued        2 g 200 mL/hr over 30 Minutes Intravenous Every 12 hours 04/24/22 1621 04/26/22  1140   04/24/22 1400  vancomycin (VANCOREADY) IVPB 2000 mg/400 mL        2,000 mg 200 mL/hr over 120 Minutes Intravenous  Once 04/24/22 1348 04/24/22 1737   04/24/22 1245  azithromycin (ZITHROMAX) 500 mg in sodium chloride 0.9 % 250 mL IVPB        500 mg 250 mL/hr over 60 Minutes Intravenous Every 24 hours 04/24/22 1237     04/24/22 1215  ceFEPIme (MAXIPIME) 2 g in sodium chloride 0.9 % 100 mL IVPB        2 g 200 mL/hr over 30 Minutes Intravenous  Once 04/24/22 1213 04/24/22 1418       Subjective: [name] today has no fevers, no emesis,  No chest pain,   -Swallowing difficulties persist -Metabolic encephalopathy/altered mentation persist 04/28/22 -BP improved weaning off pressors  -  Objective: Vitals:   04/28/22 0900 04/28/22 1129 04/28/22 1500 04/28/22 1617  BP: (!) 152/70  (!) 180/101   Pulse: 67 72 68 70  Resp: 18 (!) 22 19 (!)  23  Temp:      TempSrc:      SpO2: 98% 98% 96% 96%  Weight:      Height:        Intake/Output Summary (Last 24 hours) at 04/28/2022 1800 Last data filed at 04/28/2022 1046 Gross per 24 hour  Intake 750 ml  Output 1450 ml  Net -700 ml   Filed Weights   04/24/22 1800 04/25/22 0600 04/28/22 0354  Weight: 106.8 kg 108.3 kg 115.3 kg    Physical Exam  Physical Examination: General appearance - alert,  in no distress  Mental status -somewhat sleepy and disoriented Eyes - sclera anicteric Neck - supple, no JVD elevation , Chest - clear  to auscultation bilaterally, symmetrical air movement,  Heart - S1 and S2 normal, regular , bradycardic Abdomen - soft, nontender, nondistended, +BS, colostomy in situ Neurological - -sleepy and disoriented, appears to be moving all extremities well spontaneously Extremities - +ve pedal edema noted, intact peripheral pulses  Skin - warm, dry    Data Reviewed: I have personally reviewed following labs and imaging studies  CBC: Recent Labs  Lab 04/24/22 1135 04/25/22 0455 04/26/22 0856 04/27/22 0700 04/28/22 0352  WBC 2.9* 4.3 5.0  --  8.9  NEUTROABS 2.3  --   --   --   --   HGB 9.7* 7.7* 7.2* 9.1* 9.9*  HCT 30.5* 24.1* 22.7* 28.2* 31.3*  MCV 86.2 87.0 88.3  --  87.4  PLT 79* 75* 75*  --  67*   Basic Metabolic Panel: Recent Labs  Lab 04/24/22 1135 04/24/22 1241 04/25/22 0455 04/26/22 0856 04/27/22 0914 04/28/22 0352  NA 131*  --  134* 135 137 137  K 2.7*  --  3.9 3.6 3.1* 3.4*  CL 88*  --  92* 98 99 101  CO2 31  --  29 27 22 22   GLUCOSE 111*  --  85 166* 145* 164*  BUN 41*  --  39* 45* 49* 49*  CREATININE 1.70*  --  1.86* 2.61* 3.10* 2.97*  CALCIUM 8.3*  --  8.1* 8.4* 8.8* 9.2  MG  --  1.4*  --   --   --   --   PHOS  --   --   --  4.4 4.4  --    GFR: Estimated Creatinine Clearance: 22.7 mL/min (A) (by C-G formula based on SCr of 2.97 mg/dL (H)). Liver Function Tests: Recent Labs  Lab 04/24/22 1135 04/26/22 0856  04/27/22 0914  AST 22  --   --   ALT 19  --   --   ALKPHOS 293*  --   --   BILITOT 0.9  --   --   PROT 6.6  --   --   ALBUMIN 3.2* 2.9* 3.1*   Recent Results (from the past 240 hour(s))  Culture, blood (Routine x 2)     Status: None (Preliminary result)   Collection Time: 04/24/22 11:35 AM   Specimen: BLOOD  Result Value Ref Range Status   Specimen Description BLOOD BLOOD LEFT HAND  Final   Special Requests   Final    BOTTLES DRAWN AEROBIC AND ANAEROBIC Blood Culture adequate volume   Culture   Final    NO GROWTH 4 DAYS Performed at [facility], [address] [place], [place] [address]    Report Status PENDING  Incomplete  Urine Culture     Status: None   Collection Time: 04/24/22 11:50 AM   Specimen: Urine, Clean Catch  Result Value Ref Range Status   Specimen Description   Final    URINE, CLEAN CATCH Performed at [facility], [address], [place], [place] [address]    Special Requests   Final    NONE Performed at [facility], [address] [place], [place] [address]    Culture   Final    NO GROWTH Performed at [facility], [place] [address] [place], [place] [address]    Report Status 04/25/2022 FINAL  Final  MRSA Next Gen by PCR, Nasal     Status: Abnormal   Collection Time: 04/24/22  6:38 PM   Specimen: Nasal Mucosa; Nasal Swab  Result Value Ref Range Status   MRSA by PCR Next Gen DETECTED (A) NOT DETECTED Final    Comment:        The GeneXpert MRSA Assay (FDA approved for NASAL specimens only), is one component of a comprehensive MRSA colonization surveillance program. It is not intended to diagnose MRSA infection nor to guide or monitor treatment for MRSA infections. RESULT CALLED TO, READ BACK BY AND VERIFIED WITH: FERO,J @0025  BY MATTHEWS, B 2.8.2024 Performed at [facility], [address], [place], [place] [address]       Radiology Studies: ECHOCARDIOGRAM COMPLETE  Result Date: 04/28/2022    ECHOCARDIOGRAM REPORT   Patient Name:    DOVIE KAPUSTA Date of Exam: 04/28/2022 Medical Rec #:  [id number]        Height:       66.0 in Accession #:    [id number]       Weight:       254.2 lb Date of Birth:  03-Aug-1950        BSA:          2.214 m Patient Age:    5 years         BP:           207/89 mmHg Patient Gender: F                HR:           69 bpm. Exam Location:  [facility] Procedure: 2D Echo, Color Doppler and Cardiac Doppler Indications:    R06.9 DOE  History:        Patient has prior history of Echocardiogram examinations, most                 recent 10/03/2021. Risk  Factors:Hypertension and Diabetes.  Sonographer:    Raquel Sarna Senior RDCS Referring Phys: [phone] [facility]  Sonographer Comments: Technically difficult study due to patient body habitus and mental status. Apicals foreshortened, had to perform over the breast. IMPRESSIONS  1. Left ventricular ejection fraction, by estimation, is 60 to 65%. The left ventricle has normal function. The left ventricle has no regional wall motion abnormalities. Left ventricular diastolic parameters are consistent with Grade I diastolic dysfunction (impaired relaxation).  2. Right ventricular systolic function is normal. The right ventricular size is normal. There is moderately elevated pulmonary artery systolic pressure. The estimated right ventricular systolic pressure is 55.7 mmHg.  3. Left atrial size was mildly dilated.  4. The mitral valve is normal in structure. Trivial mitral valve regurgitation. No evidence of mitral stenosis.  5. The aortic valve is normal in structure. Aortic valve regurgitation is not visualized. No aortic stenosis is present.  6. The inferior vena cava is normal in size with greater than 50% respiratory variability, suggesting right atrial pressure of 3 mmHg. FINDINGS  Left Ventricle: Left ventricular ejection fraction, by estimation, is 60 to 65%. The left ventricle has normal function. The left ventricle has no regional wall motion abnormalities. The left ventricular  internal cavity size was normal in size. There is  no left ventricular hypertrophy. Left ventricular diastolic parameters are consistent with Grade I diastolic dysfunction (impaired relaxation). Right Ventricle: The right ventricular size is normal. No increase in right ventricular wall thickness. Right ventricular systolic function is normal. There is moderately elevated pulmonary artery systolic pressure. The tricuspid regurgitant velocity is 2.81 m/s, and with an assumed right atrial pressure of 15 mmHg, the estimated right ventricular systolic pressure is 32.2 mmHg. Left Atrium: Left atrial size was mildly dilated. Right Atrium: Right atrial size was normal in size. Pericardium: There is no evidence of pericardial effusion. Mitral Valve: The mitral valve is normal in structure. Trivial mitral valve regurgitation. No evidence of mitral valve stenosis. Tricuspid Valve: The tricuspid valve is normal in structure. Tricuspid valve regurgitation is mild . No evidence of tricuspid stenosis. Aortic Valve: The aortic valve is normal in structure. Aortic valve regurgitation is not visualized. No aortic stenosis is present. Pulmonic Valve: The pulmonic valve was normal in structure. Pulmonic valve regurgitation is not visualized. No evidence of pulmonic stenosis. Aorta: The aortic root is normal in size and structure. Venous: The inferior vena cava is normal in size with greater than 50% respiratory variability, suggesting right atrial pressure of 3 mmHg. IAS/Shunts: No atrial level shunt detected by color flow Doppler.  LEFT VENTRICLE PLAX 2D LVIDd:         4.40 cm   Diastology LVIDs:         2.90 cm   LV e' medial:    6.31 cm/s LV PW:         0.90 cm   LV E/e' medial:  13.3 LV IVS:        0.90 cm   LV e' lateral:   7.40 cm/s LVOT diam:     2.00 cm   LV E/e' lateral: 11.3 LV SV:         68 LV SV Index:   31 LVOT Area:     3.14 cm  RIGHT VENTRICLE RV S prime:     7.62 cm/s TAPSE (M-mode): 2.0 cm LEFT ATRIUM              Index LA diam:        4.40  cm 1.99 cm/m LA Vol (A2C):   49.6 ml 22.40 ml/m LA Vol (A4C):   79.9 ml 36.08 ml/m LA Biplane Vol: 67.5 ml 30.48 ml/m  AORTIC VALVE LVOT Vmax:   106.00 cm/s LVOT Vmean:  63.900 cm/s LVOT VTI:    0.216 m  AORTA Ao Root diam: 2.60 cm Ao Asc diam:  2.90 cm MITRAL VALVE                TRICUSPID VALVE MV Area (PHT): 4.96 cm     TR Peak grad:   31.6 mmHg MV Decel Time: 153 msec     TR Vmax:        281.00 cm/s MV E velocity: 83.90 cm/s MV A velocity: 122.00 cm/s  SHUNTS MV E/A ratio:  0.69         Systemic VTI:  0.22 m                             Systemic Diam: 2.00 cm Cherlynn Kaiser MD Electronically signed by Cherlynn Kaiser MD Signature Date/Time: 04/28/2022/3:12:16 PM    Final    DG CHEST PORT 1 VIEW  Result Date: 04/27/2022 CLINICAL DATA:  72 year old female status post nasogastric tube placement. EXAM: PORTABLE CHEST 1 VIEW COMPARISON:  Chest x-ray 04/24/2022. FINDINGS: Nasogastric tube extends into the distal body of the stomach. Left-sided subclavian single-lumen power porta cath with tip terminating in the region of the innominate vein. Stomach is incompletely imaged, lung volumes are very low. Bibasilar opacities which may reflect areas of atelectasis and/or consolidation. Large right and small left pleural effusions. Diffuse interstitial prominence and patchy ill-defined airspace disease noted throughout both lungs. Heart size is normal. Upper mediastinal contours are within normal limits. IMPRESSION: 1. Support apparatus, as above. 2. Large right and small left pleural effusions with bibasilar opacities which may reflect areas of atelectasis and/or consolidation. 3. Marked worsening aeration in the lungs, concerning for progressive multilobar bilateral pneumonia. Electronically Signed   By: Vinnie Langton M.D.   On: 04/27/2022 08:44     Scheduled Meds:  allopurinol  200 mg Per Tube Daily   aspirin  81 mg Per Tube Daily   aspirin EC  81 mg Oral Daily   calcium  carbonate  2 tablet Per Tube BID   Chlorhexidine Gluconate Cloth  6 each Topical Q0600   donepezil  10 mg Per Tube Daily   escitalopram  10 mg Per Tube Daily   feeding supplement (GLUCERNA SHAKE)  237 mL Oral QID   ferrous sulfate  300 mg Per Tube Q breakfast   folic acid  1 mg Per Tube Daily   free water  250 mL Per Tube QID   insulin aspart  0-5 Units Subcutaneous QHS   insulin aspart  0-6 Units Subcutaneous TID WC   levothyroxine  150 mcg Per Tube Q0600   mometasone-formoterol  2 puff Inhalation BID   mupirocin ointment  1 Application Nasal BID   pantoprazole (PROTONIX) IV  40 mg Intravenous Q24H   phenytoin  25 mg Per Tube TID   pravastatin  40 mg Per Tube q1800   senna-docusate  2 tablet Per Tube QHS   sodium chloride flush  3 mL Intravenous Q12H   sodium chloride flush  3 mL Intravenous Q12H   Continuous Infusions:  sodium chloride     sodium chloride 100 mL/hr at 04/28/22 0539   azithromycin 500 mg (04/28/22 1434)   ceFEPime (MAXIPIME)  IV 2 g (04/28/22 0541)    LOS: 4 days   Roxan Hockey M.D on 04/28/2022 at 6:00 PM  Go to [email or web address] - for contact info  [facility] - Office  [phone]  If 7PM-7AM, please contact night-coverage [email or web address] 04/28/2022, 6:00 PM

## 2022-04-28 NOTE — Progress Notes (Signed)
Placed patient back on heating blanket for temp of 98.3, orally

## 2022-04-29 ENCOUNTER — Encounter (HOSPITAL_COMMUNITY): Payer: Self-pay | Admitting: Family Medicine

## 2022-04-29 ENCOUNTER — Inpatient Hospital Stay (HOSPITAL_COMMUNITY): Payer: 59

## 2022-04-29 DIAGNOSIS — N183 Chronic kidney disease, stage 3 unspecified: Secondary | ICD-10-CM | POA: Diagnosis not present

## 2022-04-29 DIAGNOSIS — J189 Pneumonia, unspecified organism: Secondary | ICD-10-CM | POA: Diagnosis not present

## 2022-04-29 DIAGNOSIS — E1165 Type 2 diabetes mellitus with hyperglycemia: Secondary | ICD-10-CM | POA: Diagnosis not present

## 2022-04-29 DIAGNOSIS — I10 Essential (primary) hypertension: Secondary | ICD-10-CM | POA: Diagnosis not present

## 2022-04-29 DIAGNOSIS — D61818 Other pancytopenia: Secondary | ICD-10-CM | POA: Diagnosis not present

## 2022-04-29 DIAGNOSIS — Z7189 Other specified counseling: Secondary | ICD-10-CM | POA: Diagnosis not present

## 2022-04-29 DIAGNOSIS — Z515 Encounter for palliative care: Secondary | ICD-10-CM

## 2022-04-29 LAB — COMPREHENSIVE METABOLIC PANEL
ALT: 34 U/L (ref 0–44)
AST: 35 U/L (ref 15–41)
Albumin: 3 g/dL — ABNORMAL LOW (ref 3.5–5.0)
Alkaline Phosphatase: 254 U/L — ABNORMAL HIGH (ref 38–126)
Anion gap: 12 (ref 5–15)
BUN: 51 mg/dL — ABNORMAL HIGH (ref 8–23)
CO2: 21 mmol/L — ABNORMAL LOW (ref 22–32)
Calcium: 9.1 mg/dL (ref 8.9–10.3)
Chloride: 102 mmol/L (ref 98–111)
Creatinine, Ser: 3 mg/dL — ABNORMAL HIGH (ref 0.44–1.00)
GFR, Estimated: 16 mL/min — ABNORMAL LOW (ref >60)
Glucose, Bld: 159 mg/dL — ABNORMAL HIGH (ref 70–99)
Potassium: 4 mmol/L (ref 3.5–5.1)
Sodium: 135 mmol/L (ref 135–145)
Total Bilirubin: 0.9 mg/dL (ref 0.3–1.2)
Total Protein: 6.4 g/dL — ABNORMAL LOW (ref 6.5–8.1)

## 2022-04-29 LAB — CBC
HCT: 22.7 % — ABNORMAL LOW (ref 36.0–46.0)
Hemoglobin: 7.2 g/dL — ABNORMAL LOW (ref 12.0–15.0)
MCH: 28 pg (ref 26.0–34.0)
MCHC: 31.7 g/dL (ref 30.0–36.0)
MCV: 88.3 fL (ref 80.0–100.0)
Platelets: 75 10*3/uL — ABNORMAL LOW (ref 150–400)
RBC: 2.57 MIL/uL — ABNORMAL LOW (ref 3.87–5.11)
RDW: 18.3 % — ABNORMAL HIGH (ref 11.5–15.5)
WBC: 5 10*3/uL (ref 4.0–10.5)
nRBC: 0.6 % — ABNORMAL HIGH (ref 0.0–0.2)

## 2022-04-29 LAB — CULTURE, BLOOD (ROUTINE X 2)
Culture: NO GROWTH
Special Requests: ADEQUATE

## 2022-04-29 LAB — GLUCOSE, CAPILLARY
Glucose-Capillary: 137 mg/dL — ABNORMAL HIGH (ref 70–99)
Glucose-Capillary: 95 mg/dL (ref 70–99)
Glucose-Capillary: 139 mg/dL — ABNORMAL HIGH (ref 70–99)
Glucose-Capillary: 156 mg/dL — ABNORMAL HIGH (ref 70–99)
Glucose-Capillary: 160 mg/dL — ABNORMAL HIGH (ref 70–99)
Glucose-Capillary: 146 mg/dL — ABNORMAL HIGH (ref 70–99)
Glucose-Capillary: 158 mg/dL — ABNORMAL HIGH (ref 70–99)
Glucose-Capillary: 183 mg/dL — ABNORMAL HIGH (ref 70–99)
Glucose-Capillary: 258 mg/dL — ABNORMAL HIGH (ref 70–99)
Glucose-Capillary: 190 mg/dL — ABNORMAL HIGH (ref 70–99)
Glucose-Capillary: 149 mg/dL — ABNORMAL HIGH (ref 70–99)
Glucose-Capillary: 139 mg/dL — ABNORMAL HIGH (ref 70–99)
Glucose-Capillary: 130 mg/dL — ABNORMAL HIGH (ref 70–99)

## 2022-04-29 MED ORDER — FUROSEMIDE 10 MG/ML IJ SOLN
40.0000 mg | Freq: Once | INTRAMUSCULAR | Status: AC
  Administered 2022-04-29: 40 mg via INTRAVENOUS
  Filled 2022-04-29: qty 4

## 2022-04-29 MED ORDER — FUROSEMIDE 10 MG/ML IJ SOLN
INTRAMUSCULAR | Status: AC
  Administered 2022-04-29: 40 mg
  Filled 2022-04-29: qty 4

## 2022-04-29 MED ORDER — ASPIRIN 81 MG PO CHEW
81.0000 mg | CHEWABLE_TABLET | Freq: Every day | ORAL | Status: DC
  Administered 2022-04-30: 81 mg
  Filled 2022-04-29: qty 1

## 2022-04-29 MED ORDER — FUROSEMIDE 10 MG/ML IJ SOLN
40.0000 mg | Freq: Once | INTRAMUSCULAR | Status: AC
  Administered 2022-04-29: 40 mg via INTRAVENOUS

## 2022-04-29 MED ORDER — IPRATROPIUM-ALBUTEROL 0.5-2.5 (3) MG/3ML IN SOLN: 3.0000 mL | Freq: Once | RESPIRATORY_TRACT | Status: DC

## 2022-04-29 NOTE — Progress Notes (Signed)
Pt remains on 6L [place] with saturation at 100%. Complete bed bath done with linen change, new purewick in place. Mittens removed and ROM exercises done. Pt able to respond to name by making eye contact.

## 2022-04-29 NOTE — Progress Notes (Signed)
Attempt to bladder scan pt, pt voiding during 281ml. Bladder scan done after void and 57ml was seen.

## 2022-04-29 NOTE — Progress Notes (Signed)
Pt monitor alerting due to low spO2. Upon assessment, reading at 75% on room air. Titrated from 2L to 6L intermittently with no improvement. Pt work of breathing worse. MD notified, stat chest xray ordered and breathing treatment completed. MD and RT at bedside. 80 mg of lasix given, fluids stopped. Oxygen level at 93% on 6L [place] after breathing treatment complete. Will continue to monitor.

## 2022-04-29 NOTE — Consult Note (Signed)
Consultation Note Date: 04/29/2022   Patient Name: [name]  DOB: 1950-08-19  MRN: [id number]  Age / Sex: 72 y.o., female  PCP: [name] Referring Physician: Roxan Hockey, MD  Reason for Consultation: Establishing goals of care  HPI/Patient Profile: 72 y.o. 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   Clinical Assessment and Goals of Care: I have reviewed medical records including EPIC notes, labs and imaging, received report from RN, assessed the patient.  [name] is lying quietly in bed.  She appears acutely/chronically ill and very frail, obese.  She does not open eyes to voice or touch.  She clearly cannot make her needs known.  There is no family at bedside at this time.  Call to [name], [name], to discuss diagnosis prognosis, GOC, EOL wishes, disposition and options.  I introduced Palliative Medicine as specialized medical care for people living with serious illness. It focuses on providing relief from the symptoms and stress of a serious illness. The goal is to improve quality of life for both the patient and the family.  We discussed a brief life review of the patient.  [name] has been a ward of the state for approximately 13 years.  Jennifer has been her Education officer, museum for  the last 2 years.  He shares that [name] had lived in a group home until December 2023 when she was transferred to [facility].  He shares that she "loves life" and loves sitting outside.  She is under DSS care due to intellectual disability.  She had 1 child who died last year.  She has a sister who was not involved in her care.  Jennifer shares that she was walking and talking approximately 2 weeks ago.  He shares that quality of life is more important for [name].  He shares that DSS would not place PEG tube.  We then focused on their current illness.  We talk about the treatment plan in detail.  We talk about [name]'s low body temperature.  I share that she did not respond to me this morning.  The natural disease trajectory and expectations at EOL were discussed.  Advanced directives, concepts specific to code status, artifical feeding and hydration, and rehospitalization were considered and discussed.  DNR verified.  States would not accept PEG tube.  Hospice and Palliative Care services outpatient were explained and offered.  We talk about time for outcomes.  The benefits of hospice care.  Overall, Jennifer is quite  knowledgeable.  Discussed the importance of continued conversation with family and the medical providers regarding overall plan of care and treatment options, ensuring decisions are within the context of the patient's values and GOCs.  Questions and concerns were addressed. DSS SW was encouraged to call with questions or concerns.  PMT will continue to support holistically.  Conference with attending, bedside nursing staff, transition of care team related to patient condition, needs, goals of care, disposition.   HCPOA LEGAL GUARDIAN -[name] is a ward of the state.   DSS social worker, [name].     SUMMARY OF RECOMMENDATIONS   At this point continue to treat the treatable but no CPR or intubation, no PEG tube Time for outcomes Anticipate return to long-term  care if able   Code Status/Advance Care Planning: DNR, no PEG tube  Symptom Management:  Per hospitalist, no additional needs at this time.  Palliative Prophylaxis:  Frequent Pain Assessment, Oral Care, and Turn Reposition  Additional Recommendations (Limitations, Scope, Preferences): Continue to treat the treatable but no CPR or intubation, no PEG tube  Psycho-social/Spiritual:  Desire for further Chaplaincy support:no Additional Recommendations:  No special needs at this time.  Prognosis:  Unable to determine, guarded at this time.  Discharge Planning: To Be Determined      Primary Diagnoses: Present on Admission:  Pneumonia  Pancytopenia (HCC)  Chronic renal disease, stage 3, moderately decreased glomerular filtration rate between 30-59 mL/min/1.73 square meter (HCC)  Essential hypertension  Uncontrolled type 2 diabetes mellitus with hyperglycemia, without long-term current use of insulin ([place])  Septic shock ([place])   I have reviewed the medical record, interviewed the patient and family, and examined the patient. The following aspects are pertinent.  Past Medical History:  Diagnosis Date   Arthritis    Asthma    Cancer ([place])    Rectal    Chronic renal disease, stage 3, moderately decreased glomerular filtration rate between 30-59 mL/min/1.73 square meter ([place]) 04/27/2014   Depression    Diabetes mellitus    years   GERD (gastroesophageal reflux disease)    Gout    History of recurrent UTIs    Hypertension    Iron deficiency 04/27/2014   Lung cancer ([place])    Mental retardation    stopped school at 9th grade    Numbness and tingling in hands    x several months    Rectal cancer ([place])    Seizures ([place])    more than 4 yrs since last seizure. UNknown etiology   Shortness of breath    with exertion   Vitamin B 12 deficiency 02/01/2015   Incidentally found without antibody testing.     Social History   Socioeconomic History   Marital status: Single     Spouse name: Not on file   Number of children: Not on file   Years of education: Not on file   Highest education level: Not on file  Occupational History   Not on file  Tobacco Use   Smoking status: Former    Types: Cigarettes    Quit date: 07/16/2008    Years since quitting: 13.7   Smokeless tobacco: Never   Tobacco comments:    unknown when stopped smoking      long time  Substance and Sexual Activity   Alcohol use: No   Drug use: No   Sexual activity: Not on file  Other Topics Concern   Not on file  Social History Narrative   Not on  file   Social Determinants of Health   Financial Resource Strain: Not on file  Food Insecurity: Not on file  Transportation Needs: Not on file  Physical Activity: Not on file  Stress: Not on file  Social Connections: Not on file   Family History  Family history unknown: Yes   Scheduled Meds:  allopurinol  200 mg Per Tube Daily   [START ON 04/30/2022] aspirin  81 mg Per Tube Daily   calcium carbonate  2 tablet Per Tube BID   Chlorhexidine Gluconate Cloth  6 each Topical Q0600   donepezil  10 mg Per Tube Daily   escitalopram  10 mg Per Tube Daily   feeding supplement (GLUCERNA SHAKE)  237 mL Oral QID   ferrous sulfate  300 mg Per Tube Q breakfast   folic acid  1 mg Per Tube Daily   free water  250 mL Per Tube QID   insulin aspart  0-5 Units Subcutaneous QHS   insulin aspart  0-6 Units Subcutaneous TID WC   ipratropium-albuterol  3 mL Nebulization Once   levothyroxine  150 mcg Per Tube Q0600   mometasone-formoterol  2 puff Inhalation BID   mupirocin ointment  1 Application Nasal BID   pantoprazole (PROTONIX) IV  40 mg Intravenous Q24H   phenytoin  25 mg Per Tube TID   pravastatin  40 mg Per Tube q1800   senna-docusate  2 tablet Per Tube QHS   sodium chloride flush  3 mL Intravenous Q12H   sodium chloride flush  3 mL Intravenous Q12H   Continuous Infusions:  sodium chloride     azithromycin Stopped (04/29/22 1259)   ceFEPime  (MAXIPIME) IV 2 g (04/29/22 0557)   PRN Meds:.sodium chloride, acetaminophen **OR** acetaminophen, albuterol, bisacodyl, fentaNYL (SUBLIMAZE) injection, hydrALAZINE, labetalol, ondansetron **OR** ondansetron (ZOFRAN) IV, oxyCODONE, polyethylene glycol, sodium chloride flush, traZODone Medications Prior to Admission:  Prior to Admission medications   Medication Sig Start Date End Date Taking? Authorizing Provider  allopurinol (ZYLOPRIM) 300 MG tablet Take 200 mg by mouth daily. 12/28/20  Yes [provider]  amLODipine (NORVASC) 5 MG tablet Take 5 mg by mouth daily. 06/23/19  Yes [provider]  ascorbic acid (VITAMIN C) 500 MG tablet Take 500 mg by mouth daily.   Yes [provider]  aspirin EC 81 MG tablet Take 81 mg by mouth daily.   Yes [provider]  calcium carbonate (TUMS) 500 MG chewable tablet Chew 2 tablets (400 mg of elemental calcium total) by mouth 3 (three) times daily. Patient taking differently: Chew 2 tablets by mouth 2 (two) times daily. 12/02/16  Yes Twana First, MD  Cholecalciferol 25 MCG (1000 UT) TBDP Take 1 tablet by mouth daily.   Yes [provider]  diclofenac Sodium (VOLTAREN) 1 % GEL Apply 2 g topically 4 (four) times daily.   Yes [provider]  docusate sodium (COLACE) 100 MG capsule Take 100 mg by mouth 2 (two) times daily.   Yes [provider]  donepezil (ARICEPT) 5 MG tablet Take 10 mg by mouth daily. 06/01/20  Yes [provider]  escitalopram (LEXAPRO) 10 MG tablet Take 10 mg by mouth daily. 08/30/21  Yes [provider]  ferrous sulfate 325 (65 FE) MG tablet Take 325 mg by mouth 2 (two) times daily with a meal.   Yes [provider]  fish oil-omega-3 fatty acids 1000 MG capsule Take 1 g by mouth 2 (two) times daily.   Yes [provider]  Fluticasone-Salmeterol (ADVAIR) 250-50 MCG/DOSE AEPB Inhale 1 puff into the lungs every 12 (twelve) hours.   Yes [provider]  furosemide (LASIX) 40 MG tablet Take 40 mg by mouth 2 (two) times daily. 09/01/19  Yes [provider]  insulin lispro (HUMALOG) 100 UNIT/ML injection Inject 1-5 Units into the skin 3 (three) times daily before meals.   Yes [provider]  metoprolol (TOPROL-XL) 200 MG 24 hr tablet Take 200 mg by mouth in the morning and at bedtime. 09/30/17  Yes [provider]  montelukast (SINGULAIR) 10 MG tablet Take 10 mg by mouth at bedtime.   Yes [provider]  pantoprazole (PROTONIX) 40 MG tablet Take 40 mg by mouth every morning.    Yes [provider]  phenytoin (DILANTIN) 100 MG ER capsule Take 100 mg by mouth daily.   Yes [provider]  pravastatin (PRAVACHOL) 40 MG tablet Take 40 mg by mouth every evening.    Yes [provider]  traZODone (DESYREL) 50 MG tablet Take 50 mg by mouth at bedtime as needed for sleep. 01/26/18  Yes [provider]  Struble test strip  12/22/17   [provider]  Lancets Misc. (UNISTIK 3 COMFORT) [place]  06/15/20   [provider]  metFORMIN (GLUCOPHAGE) 1000 MG tablet Take by mouth. Patient not taking: Reported on 04/24/2022 01/26/18   [provider]  NOVOFINE AUTOCOVER 30G X 8 MM MISC USE ONCE DAILY WITHOINSULIN PEN(S). 05/07/19   [provider]  OS-CAL CALCIUM + D3 500-200 MG-UNIT TABS TAKE 2 TABLETS BY MOUTH DAILY WITH BREAKFAST. Patient not taking: Reported on 04/24/2022 07/29/17   Holley Bouche, NP   No Known Allergies Review of Systems  Unable to perform ROS: Acuity of condition    Physical Exam Vitals and nursing note reviewed.     Vital Signs: BP (!) 126/48   Pulse 86   Temp 98.2 F (36.8 C) (Oral)   Resp (!) 27   Ht 5\' 6"  (1.676 m)   Wt 115.3 kg   SpO2 90%   BMI 41.03 kg/m  Pain Scale: PAINAD POSS *See Group Information*: 1-Acceptable,Awake and alert Pain Score: 0-No pain   SpO2: SpO2: 90 % O2 Device:SpO2:  90 % O2 Flow Rate: .O2 Flow Rate (L/min): 2 L/min  IO: Intake/output summary:  Intake/Output Summary (Last 24 hours) at 04/29/2022 1416 Last data filed at 04/29/2022 0383 Gross per 24 hour  Intake 5318.51 ml  Output 850 ml  Net 4468.51 ml    LBM: Last BM Date : 04/29/22 Baseline Weight: Weight: 102 kg (from 12/23 office visit) Most recent weight: Weight: 115.3 kg     Palliative Assessment/Data:     Time In: 1300 Time Out: 1415 Time Total: 75 minutes  Greater than 50%  of this time was spent counseling and coordinating care related to the above assessment and plan.  Signed by: Drue Novel, NP   Please contact Palliative Medicine Team phone at [phone] for questions and concerns.  For individual provider: See Shea Evans

## 2022-04-29 NOTE — Progress Notes (Signed)
SLP Cancellation Note  Patient Details Name: [name] MRN: [id number] DOB: [birth year]   Cancelled treatment:       Reason Eval/Treat Not Completed: Medical issues which prohibited therapy (Pt inappropriate for PO trials at this time due to medical status. SLP will check back tomorrow. Reivewed with RN.)  Thank you,  Genene Churn, [place]  [place] 04/29/2022, 2:05 PM

## 2022-04-29 NOTE — Progress Notes (Signed)
PROGRESS NOTE     [name], is a 72 y.o. female, DOB - 09-Nov-1950, TSV:[id number]  Admit date - 04/24/2022   Admitting Physician Teale Goodgame Denton Brick, MD  Outpatient Primary MD for the patient is Langley, [name]  LOS - 5  Chief Complaint  Patient presents with   Altered Mental Status        Brief Narrative:  - 72 y.o. 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, status post prior chemo and radiation therapy, CKD 3A, chronic anemia,  chronic thrombocytopenia, history of seizures, hypertension, DM, obesity and B12 deficiency     -Assessment and Plan: 1)Severe sepsis with septic shock - secondary to presumed right-sided pneumonia and UTI---- -Weaned off IV Levophed --  -Weaned off warming blanket --c/n with IV fluids, IV Vanco, azithromycin and cefepime pending further culture data -Blood and urine cultures NGTD -Leukopenia has resolved, WBC has normalized   2) acute metabolic encephalopathy--- due to #1 above Additional history obtained from state appointed legal guardian at bedside [name] -Patient apparently is usually conversational and interactive ambulates short distances with a walker long distances with wheelchair until recently, 04/29/22 -Altered mentation persist, patient is largely nonverbal mostly lethargic -Treat #1 above   3) acute on chronic anemia and Thrombocytopenia-- longstanding history of chronic anemia and chronic thrombocytopenia  --Patient recently got iron infusions around 03/25/2022, she was scheduled to start getting Aranesp shots soon -platelets were 145 on 04/09/2022) -Hgb was 7.9 on 03/25/2022 and patient received iron infusion at the time -Serum iron is 91 -Folate is low at 4.3 -c/n folic acid 12/08/28 -Hgb greater than 9 from 7.2 after transfusion of 1 unit of PRBC on 04/26/2022 -Platelets trending down -No acute bleeding concerns at this time  4)  hypokalemia/hyponatremia/hypochloremia/hypomagnesemia--  -No vomiting or diarrhea -Replace and recheck   5) hypothyroidism---TSH is 12.95 -May be contributing to hypothermia -T3 is low at 1.4 -T4 is 0.69 which is borderline low normal -c/n levothyroxine   6)Aki on CKD stage -3A   --- -Creatinine trending up due to hypotension and dehydration in the setting of severe sepsis with septic shock -Creatinine continues to trend up most likely due to recent hemodynamic instability -renally adjust medications, avoid nephrotoxic agents / dehydration  / hypotension   7)Stage IV Rectal Cancer with oligometastatic to the lung, status post prior chemo and radiation therapy----recent surveillance showed no evidence of recurrence -Patient follows with Dr. Delton Coombes   8) history of seizures--- no recent seizures reported -Elevated Dilantin level was noted -Pharmacist adjusted dilantin   9)Bradycardia----suspect related to hypothermia and metoprolol use -Hold metoprolol -Heart rate Improving   10)DM2- Use Novolog/Humalog Sliding scale insulin with Accu-Cheks/Fingersticks as ordered    11)Social/Ethics-discussed with state appointed legal guardian Mr. [name] --Patient is now DNR/DNI -Palliative care consult appreciated  12) Dilantin toxicity--- -Elevated Dilantin level was noted -Pharmacist adjusted dilantin pharmacy consult for adjustment of Dilantin  13)Dysphagia--- speech pathologist evaluation appreciated, recommends n.p.o. status for now -NG tube placed on 04/26/2022 to allow for patient administration and feeding and free water administration  14) acute hypoxic respiratory failure -Patient received IV Lasix, and bronchodilators Chest x-ray noted  Status is: Inpatient   Remains inpatient appropriate because:    Disposition: The patient is from: SNF              Anticipated d/c is to: SNF              Anticipated d/c date is: >  3 days              Patient currently is not  medically stable to d/c. Barriers: Not Clinically Stable-   Code Status :  -  Code Status: DNR   Family Communication:   Discussed with  state appointed legal guardian Mr. [name]  DVT Prophylaxis  :   - SCDs  SCDs Start: 04/24/22 1834 Place TED hose Start: 04/24/22 1834   Lab Results  Component Value Date   PLT 67 (L) 04/28/2022   Inpatient Medications  Scheduled Meds:  allopurinol  200 mg Per Tube Daily   [START ON 04/30/2022] aspirin  81 mg Per Tube Daily   calcium carbonate  2 tablet Per Tube BID   Chlorhexidine Gluconate Cloth  6 each Topical Q0600   donepezil  10 mg Per Tube Daily   escitalopram  10 mg Per Tube Daily   feeding supplement (GLUCERNA SHAKE)  237 mL Oral QID   ferrous sulfate  300 mg Per Tube Q breakfast   folic acid  1 mg Per Tube Daily   free water  250 mL Per Tube QID   insulin aspart  0-5 Units Subcutaneous QHS   insulin aspart  0-6 Units Subcutaneous TID WC   ipratropium-albuterol  3 mL Nebulization Once   levothyroxine  150 mcg Per Tube Q0600   mometasone-formoterol  2 puff Inhalation BID   mupirocin ointment  1 Application Nasal BID   pantoprazole (PROTONIX) IV  40 mg Intravenous Q24H   phenytoin  25 mg Per Tube TID   pravastatin  40 mg Per Tube q1800   senna-docusate  2 tablet Per Tube QHS   sodium chloride flush  3 mL Intravenous Q12H   sodium chloride flush  3 mL Intravenous Q12H   Continuous Infusions:  sodium chloride     azithromycin Stopped (04/29/22 1248)   ceFEPime (MAXIPIME) IV Stopped (04/29/22 0627)   PRN Meds:.sodium chloride, acetaminophen **OR** acetaminophen, albuterol, bisacodyl, fentaNYL (SUBLIMAZE) injection, hydrALAZINE, labetalol, ondansetron **OR** ondansetron (ZOFRAN) IV, oxyCODONE, polyethylene glycol, sodium chloride flush, traZODone   Anti-infectives (From admission, onward)    Start     Dose/Rate Route Frequency Ordered Stop   04/27/22 0600  ceFEPIme (MAXIPIME) 2 g in sodium chloride 0.9 % 100 mL IVPB         2 g 200 mL/hr over 30 Minutes Intravenous Every 24 hours 04/26/22 1140     04/25/22 2200  vancomycin (VANCOREADY) IVPB 1500 mg/300 mL  Status:  Discontinued        1,500 mg 150 mL/hr over 120 Minutes Intravenous Every 48 hours 04/24/22 1618 04/27/22 1500   04/25/22 0600  ceFEPIme (MAXIPIME) 2 g in sodium chloride 0.9 % 100 mL IVPB  Status:  Discontinued        2 g 200 mL/hr over 30 Minutes Intravenous Every 12 hours 04/24/22 1621 04/26/22 1140   04/24/22 1400  vancomycin (VANCOREADY) IVPB 2000 mg/400 mL        2,000 mg 200 mL/hr over 120 Minutes Intravenous  Once 04/24/22 1348 04/24/22 1737   04/24/22 1245  azithromycin (ZITHROMAX) 500 mg in sodium chloride 0.9 % 250 mL IVPB        500 mg 250 mL/hr over 60 Minutes Intravenous Every 24 hours 04/24/22 1237     04/24/22 1215  ceFEPIme (MAXIPIME) 2 g in sodium chloride 0.9 % 100 mL IVPB        2 g 200 mL/hr over 30 Minutes Intravenous  Once  04/24/22 1213 04/24/22 1418       Subjective: [name] today has no fevers, no emesis,  No chest pain,   04/29/22 -Swallowing difficulties persist -Metabolic encephalopathy/altered mentation persist -Episode of worsening hypoxia--requiring IV Lasix and bronchodilators  Objective: Vitals:   04/29/22 1530 04/29/22 1600 04/29/22 1630 04/29/22 1730  BP: (!) 161/60 (!) 161/56 (!) 156/58 (!) 164/58  Pulse: 88 84 84 84  Resp: (!) 23 19 19 11   Temp:      TempSrc:      SpO2: 100% 100% 100% 100%  Weight:      Height:        Intake/Output Summary (Last 24 hours) at 04/29/2022 1742 Last data filed at 04/29/2022 1613 Gross per 24 hour  Intake 5625.85 ml  Output 1100 ml  Net 4525.85 ml   Filed Weights   04/25/22 0600 04/28/22 0354 04/29/22 0500  Weight: 108.3 kg 115.3 kg 115.3 kg    Physical Exam  Physical Examination: General appearance - lethargic, in no distress  Mental status -somewhat sleepy and disoriented Eyes - sclera anicteric Neck - supple, no JVD elevation , Chest  -diminished breath sounds, no significant wheezing Heart - S1 and S2 normal, regular ,  Abdomen - soft, nontender, nondistended, +BS, colostomy in situ Neurological - -sleepy and disoriented, appears to be moving all extremities well spontaneously Extremities - +ve pedal edema noted, intact peripheral pulses  Skin - warm, dry    Data Reviewed: I have personally reviewed following labs and imaging studies  CBC: Recent Labs  Lab 04/24/22 1135 04/25/22 0455 04/26/22 0856 04/27/22 0700 04/28/22 0352  WBC 2.9* 4.3 5.0  --  8.9  NEUTROABS 2.3  --   --   --   --   HGB 9.7* 7.7* 7.2* 9.1* 9.9*  HCT 30.5* 24.1* 22.7* 28.2* 31.3*  MCV 86.2 87.0 88.3  --  87.4  PLT 79* 75* 75*  --  67*   Basic Metabolic Panel: Recent Labs  Lab 04/24/22 1241 04/25/22 0455 04/26/22 0856 04/27/22 0914 04/28/22 0352 04/29/22 0345  NA  --  134* 135 137 137 135  K  --  3.9 3.6 3.1* 3.4* 4.0  CL  --  92* 98 99 101 102  CO2  --  29 27 22 22  21*  GLUCOSE  --  85 166* 145* 164* 159*  BUN  --  39* 45* 49* 49* 51*  CREATININE  --  1.86* 2.61* 3.10* 2.97* 3.00*  CALCIUM  --  8.1* 8.4* 8.8* 9.2 9.1  MG 1.4*  --   --   --   --   --   PHOS  --   --  4.4 4.4  --   --    GFR: Estimated Creatinine Clearance: 22.5 mL/min (A) (by C-G formula based on SCr of 3 mg/dL (H)). Liver Function Tests: Recent Labs  Lab 04/24/22 1135 04/26/22 0856 04/27/22 0914 04/29/22 0345  AST 22  --   --  35  ALT 19  --   --  34  ALKPHOS 293*  --   --  254*  BILITOT 0.9  --   --  0.9  PROT 6.6  --   --  6.4*  ALBUMIN 3.2* 2.9* 3.1* 3.0*   Recent Results (from the past 240 hour(s))  Culture, blood (Routine x 2)     Status: None   Collection Time: 04/24/22 11:35 AM   Specimen: BLOOD  Result Value Ref Range Status   Specimen Description BLOOD  BLOOD LEFT HAND  Final   Special Requests   Final    BOTTLES DRAWN AEROBIC AND ANAEROBIC Blood Culture adequate volume   Culture   Final    NO GROWTH 5 DAYS Performed at [facility], [address], [place], [place] [address]    Report Status 04/29/2022 FINAL  Final  Urine Culture     Status: None   Collection Time: 04/24/22 11:50 AM   Specimen: Urine, Clean Catch  Result Value Ref Range Status   Specimen Description   Final    URINE, CLEAN CATCH Performed at [facility], [address], [place], [place] [address]    Special Requests   Final    NONE Performed at [facility] [place], [address], [place], [place] [address]    Culture   Final    NO GROWTH Performed at [facility], [place] [address] [place], [place] [address]    Report Status 04/25/2022 FINAL  Final  MRSA Next Gen by PCR, Nasal     Status: Abnormal   Collection Time: 04/24/22  6:38 PM   Specimen: Nasal Mucosa; Nasal Swab  Result Value Ref Range Status   MRSA by PCR Next Gen DETECTED (A) NOT DETECTED Final    Comment:        The GeneXpert MRSA Assay (FDA approved for NASAL specimens only), is one component of a comprehensive MRSA colonization surveillance program. It is not intended to diagnose MRSA infection nor to guide or monitor treatment for MRSA infections. RESULT CALLED TO, READ BACK BY AND VERIFIED WITH: FERO,J @0025  BY MATTHEWS, B 2.8.2024 Performed at [facility], [address] [place], [place] [address]       Radiology Studies: DG CHEST PORT 1 VIEW  Result Date: 04/29/2022 CLINICAL DATA:  Dyspnea EXAM: PORTABLE CHEST 1 VIEW COMPARISON:  04/27/2022 FINDINGS: Very low volume AP portable examination. Left chest port catheter. Esophagogastric tube with tip and side below the diaphragm. Probable small, layering bilateral pleural effusions. No new airspace opacity. Heart and mediastinum unremarkable. IMPRESSION: 1. Very low volume AP portable examination. Probable small, layering bilateral pleural effusions. No new airspace opacity. 2. Esophagogastric tube with tip and side below the diaphragm. Electronically Signed   By: Delanna Ahmadi M.D.   On: 04/29/2022 13:11    ECHOCARDIOGRAM COMPLETE  Result Date: 04/28/2022    ECHOCARDIOGRAM REPORT   Patient Name:   DEVORY MCKINZIE Date of Exam: 04/28/2022 Medical Rec #:  [id number]        Height:       66.0 in Accession #:    [id number]       Weight:       254.2 lb Date of Birth:  05/13/1950        BSA:          2.214 m Patient Age:    24 years         BP:           207/89 mmHg Patient Gender: F                HR:           69 bpm. Exam Location:  [facility] Procedure: 2D Echo, Color Doppler and Cardiac Doppler Indications:    R06.9 DOE  History:        Patient has prior history of Echocardiogram examinations, most                 recent 10/03/2021. Risk Factors:Hypertension and  Diabetes.  Sonographer:    Raquel Sarna Senior RDCS Referring Phys: [phone] [facility]  Sonographer Comments: Technically difficult study due to patient body habitus and mental status. Apicals foreshortened, had to perform over the breast. IMPRESSIONS  1. Left ventricular ejection fraction, by estimation, is 60 to 65%. The left ventricle has normal function. The left ventricle has no regional wall motion abnormalities. Left ventricular diastolic parameters are consistent with Grade I diastolic dysfunction (impaired relaxation).  2. Right ventricular systolic function is normal. The right ventricular size is normal. There is moderately elevated pulmonary artery systolic pressure. The estimated right ventricular systolic pressure is 89.3 mmHg.  3. Left atrial size was mildly dilated.  4. The mitral valve is normal in structure. Trivial mitral valve regurgitation. No evidence of mitral stenosis.  5. The aortic valve is normal in structure. Aortic valve regurgitation is not visualized. No aortic stenosis is present.  6. The inferior vena cava is normal in size with greater than 50% respiratory variability, suggesting right atrial pressure of 3 mmHg. FINDINGS  Left Ventricle: Left ventricular ejection fraction, by estimation, is 60 to 65%. The left ventricle has  normal function. The left ventricle has no regional wall motion abnormalities. The left ventricular internal cavity size was normal in size. There is  no left ventricular hypertrophy. Left ventricular diastolic parameters are consistent with Grade I diastolic dysfunction (impaired relaxation). Right Ventricle: The right ventricular size is normal. No increase in right ventricular wall thickness. Right ventricular systolic function is normal. There is moderately elevated pulmonary artery systolic pressure. The tricuspid regurgitant velocity is 2.81 m/s, and with an assumed right atrial pressure of 15 mmHg, the estimated right ventricular systolic pressure is 73.4 mmHg. Left Atrium: Left atrial size was mildly dilated. Right Atrium: Right atrial size was normal in size. Pericardium: There is no evidence of pericardial effusion. Mitral Valve: The mitral valve is normal in structure. Trivial mitral valve regurgitation. No evidence of mitral valve stenosis. Tricuspid Valve: The tricuspid valve is normal in structure. Tricuspid valve regurgitation is mild . No evidence of tricuspid stenosis. Aortic Valve: The aortic valve is normal in structure. Aortic valve regurgitation is not visualized. No aortic stenosis is present. Pulmonic Valve: The pulmonic valve was normal in structure. Pulmonic valve regurgitation is not visualized. No evidence of pulmonic stenosis. Aorta: The aortic root is normal in size and structure. Venous: The inferior vena cava is normal in size with greater than 50% respiratory variability, suggesting right atrial pressure of 3 mmHg. IAS/Shunts: No atrial level shunt detected by color flow Doppler.  LEFT VENTRICLE PLAX 2D LVIDd:         4.40 cm   Diastology LVIDs:         2.90 cm   LV e' medial:    6.31 cm/s LV PW:         0.90 cm   LV E/e' medial:  13.3 LV IVS:        0.90 cm   LV e' lateral:   7.40 cm/s LVOT diam:     2.00 cm   LV E/e' lateral: 11.3 LV SV:         68 LV SV Index:   31 LVOT Area:      3.14 cm  RIGHT VENTRICLE RV S prime:     7.62 cm/s TAPSE (M-mode): 2.0 cm LEFT ATRIUM             Index LA diam:        4.40 cm 1.99  cm/m LA Vol (A2C):   49.6 ml 22.40 ml/m LA Vol (A4C):   79.9 ml 36.08 ml/m LA Biplane Vol: 67.5 ml 30.48 ml/m  AORTIC VALVE LVOT Vmax:   106.00 cm/s LVOT Vmean:  63.900 cm/s LVOT VTI:    0.216 m  AORTA Ao Root diam: 2.60 cm Ao Asc diam:  2.90 cm MITRAL VALVE                TRICUSPID VALVE MV Area (PHT): 4.96 cm     TR Peak grad:   31.6 mmHg MV Decel Time: 153 msec     TR Vmax:        281.00 cm/s MV E velocity: 83.90 cm/s MV A velocity: 122.00 cm/s  SHUNTS MV E/A ratio:  0.69         Systemic VTI:  0.22 m                             Systemic Diam: 2.00 cm Cherlynn Kaiser MD Electronically signed by Cherlynn Kaiser MD Signature Date/Time: 04/28/2022/3:12:16 PM    Final      Scheduled Meds:  allopurinol  200 mg Per Tube Daily   [START ON 04/30/2022] aspirin  81 mg Per Tube Daily   calcium carbonate  2 tablet Per Tube BID   Chlorhexidine Gluconate Cloth  6 each Topical Q0600   donepezil  10 mg Per Tube Daily   escitalopram  10 mg Per Tube Daily   feeding supplement (GLUCERNA SHAKE)  237 mL Oral QID   ferrous sulfate  300 mg Per Tube Q breakfast   folic acid  1 mg Per Tube Daily   free water  250 mL Per Tube QID   insulin aspart  0-5 Units Subcutaneous QHS   insulin aspart  0-6 Units Subcutaneous TID WC   ipratropium-albuterol  3 mL Nebulization Once   levothyroxine  150 mcg Per Tube Q0600   mometasone-formoterol  2 puff Inhalation BID   mupirocin ointment  1 Application Nasal BID   pantoprazole (PROTONIX) IV  40 mg Intravenous Q24H   phenytoin  25 mg Per Tube TID   pravastatin  40 mg Per Tube q1800   senna-docusate  2 tablet Per Tube QHS   sodium chloride flush  3 mL Intravenous Q12H   sodium chloride flush  3 mL Intravenous Q12H   Continuous Infusions:  sodium chloride     azithromycin Stopped (04/29/22 1248)   ceFEPime (MAXIPIME) IV Stopped (04/29/22  7106)    LOS: 5 days   Roxan Hockey M.D on 04/29/2022 at 5:42 PM  Go to [email or web address] - for contact info  [facility] - Office  [phone]  If 7PM-7AM, please contact night-coverage [email or web address] 04/29/2022, 5:42 PM

## 2022-04-30 ENCOUNTER — Inpatient Hospital Stay (HOSPITAL_COMMUNITY): Payer: 59

## 2022-04-30 ENCOUNTER — Inpatient Hospital Stay: Payer: 59

## 2022-04-30 DIAGNOSIS — Z515 Encounter for palliative care: Secondary | ICD-10-CM | POA: Diagnosis not present

## 2022-04-30 DIAGNOSIS — A419 Sepsis, unspecified organism: Secondary | ICD-10-CM | POA: Diagnosis not present

## 2022-04-30 DIAGNOSIS — I10 Essential (primary) hypertension: Secondary | ICD-10-CM | POA: Diagnosis not present

## 2022-04-30 DIAGNOSIS — N1831 Chronic kidney disease, stage 3a: Secondary | ICD-10-CM | POA: Diagnosis not present

## 2022-04-30 DIAGNOSIS — E1165 Type 2 diabetes mellitus with hyperglycemia: Secondary | ICD-10-CM | POA: Diagnosis not present

## 2022-04-30 DIAGNOSIS — Z7189 Other specified counseling: Secondary | ICD-10-CM | POA: Diagnosis not present

## 2022-04-30 DIAGNOSIS — J189 Pneumonia, unspecified organism: Secondary | ICD-10-CM | POA: Diagnosis not present

## 2022-04-30 LAB — GLUCOSE, CAPILLARY
Glucose-Capillary: 121 mg/dL — ABNORMAL HIGH (ref 70–99)
Glucose-Capillary: 118 mg/dL — ABNORMAL HIGH (ref 70–99)
Glucose-Capillary: 123 mg/dL — ABNORMAL HIGH (ref 70–99)

## 2022-04-30 LAB — COMPREHENSIVE METABOLIC PANEL
ALT: 34 U/L (ref 0–44)
AST: 33 U/L (ref 15–41)
Albumin: 2.7 g/dL — ABNORMAL LOW (ref 3.5–5.0)
Alkaline Phosphatase: 251 U/L — ABNORMAL HIGH (ref 38–126)
Anion gap: 11 (ref 5–15)
BUN: 55 mg/dL — ABNORMAL HIGH (ref 8–23)
CO2: 21 mmol/L — ABNORMAL LOW (ref 22–32)
Calcium: 8.9 mg/dL (ref 8.9–10.3)
Chloride: 102 mmol/L (ref 98–111)
Creatinine, Ser: 3.44 mg/dL — ABNORMAL HIGH (ref 0.44–1.00)
GFR, Estimated: 14 mL/min — ABNORMAL LOW (ref >60)
Glucose, Bld: 139 mg/dL — ABNORMAL HIGH (ref 70–99)
Potassium: 4.5 mmol/L (ref 3.5–5.1)
Sodium: 134 mmol/L — ABNORMAL LOW (ref 135–145)
Total Bilirubin: 0.7 mg/dL (ref 0.3–1.2)
Total Protein: 5.9 g/dL — ABNORMAL LOW (ref 6.5–8.1)

## 2022-04-30 MED ORDER — POLYVINYL ALCOHOL 1.4 % OP SOLN: 1.0000 [drp] | Freq: Four times a day (QID) | OPHTHALMIC | Status: DC | PRN

## 2022-04-30 MED ORDER — FUROSEMIDE 10 MG/ML IJ SOLN
40.0000 mg | Freq: Once | INTRAMUSCULAR | Status: AC
  Administered 2022-04-30: 40 mg via INTRAVENOUS
  Filled 2022-04-30: qty 4

## 2022-04-30 MED ORDER — ONDANSETRON 4 MG PO TBDP: 4.0000 mg | ORAL_TABLET | Freq: Four times a day (QID) | ORAL | 0 refills | Status: DC | PRN

## 2022-04-30 MED ORDER — LORAZEPAM 2 MG/ML IJ SOLN: 1.0000 mg | INTRAMUSCULAR | Status: DC | PRN

## 2022-04-30 MED ORDER — GLYCOPYRROLATE 0.2 MG/ML IJ SOLN: 0.2000 mg | INTRAMUSCULAR | Status: DC | PRN

## 2022-04-30 MED ORDER — BIOTENE DRY MOUTH MT LIQD: 15.0000 mL | OROMUCOSAL | Status: DC | PRN

## 2022-04-30 MED ORDER — SCOPOLAMINE 1 MG/3DAYS TD PT72
1.0000 | MEDICATED_PATCH | TRANSDERMAL | Status: DC
  Administered 2022-04-30: 1.5 mg via TRANSDERMAL
  Filled 2022-04-30: qty 1

## 2022-04-30 MED ORDER — HALOPERIDOL 0.5 MG PO TABS: 0.5000 mg | ORAL_TABLET | ORAL | Status: DC | PRN

## 2022-04-30 MED ORDER — HALOPERIDOL LACTATE 2 MG/ML PO CONC: 0.5000 mg | ORAL | Status: DC | PRN

## 2022-04-30 MED ORDER — FENTANYL CITRATE PF 50 MCG/ML IJ SOSY: 25.0000 ug | PREFILLED_SYRINGE | INTRAMUSCULAR | Status: DC | PRN

## 2022-04-30 MED ORDER — ACETAMINOPHEN 650 MG RE SUPP: 650.0000 mg | Freq: Four times a day (QID) | RECTAL | Status: DC | PRN

## 2022-04-30 MED ORDER — ONDANSETRON HCL 4 MG/2ML IJ SOLN: 4.0000 mg | Freq: Four times a day (QID) | INTRAMUSCULAR | Status: DC | PRN

## 2022-04-30 MED ORDER — GLYCOPYRROLATE 1 MG PO TABS: 1.0000 mg | ORAL_TABLET | ORAL | Status: DC | PRN

## 2022-04-30 MED ORDER — POLYVINYL ALCOHOL 1.4 % OP SOLN: 1.0000 [drp] | Freq: Four times a day (QID) | OPHTHALMIC | 0 refills | Status: DC | PRN

## 2022-04-30 MED ORDER — ACETAMINOPHEN 325 MG PO TABS: 650.0000 mg | ORAL_TABLET | Freq: Four times a day (QID) | ORAL | Status: DC | PRN

## 2022-04-30 MED ORDER — ONDANSETRON 4 MG PO TBDP: 4.0000 mg | ORAL_TABLET | Freq: Four times a day (QID) | ORAL | Status: DC | PRN

## 2022-04-30 MED ORDER — HEPARIN SOD (PORK) LOCK FLUSH 100 UNIT/ML IV SOLN
500.0000 [IU] | INTRAVENOUS | Status: AC | PRN
  Administered 2022-04-30: 500 [IU]
  Filled 2022-04-30: qty 5

## 2022-04-30 MED ORDER — HALOPERIDOL LACTATE 5 MG/ML IJ SOLN: 0.5000 mg | INTRAMUSCULAR | Status: DC | PRN

## 2022-04-30 MED ORDER — SCOPOLAMINE 1 MG/3DAYS TD PT72: 1.0000 | MEDICATED_PATCH | TRANSDERMAL | 12 refills | Status: DC

## 2022-04-30 NOTE — Progress Notes (Signed)
Pt has JVD, despite an extra 40mg  lasix oup is almost nothing, and BUN and Creat are increased. Waiting report for morning CXR.

## 2022-04-30 NOTE — Discharge Summary (Signed)
[name], is a 72 y.o. female  DOB 03-30-1950  MRN [id number].  Admission date:  04/24/2022  Admitting Physician  Roxan Hockey, MD  Discharge Date:  04/30/2022   Primary MD  Nanine Means  Recommendations for primary care physician for things to follow:   Discharged to residential hospice with full comfort care  Admission Diagnosis  Pneumonia [J18.9] Hypothermia, initial encounter [T68.XXXA]   Discharge Diagnosis  Pneumonia [J18.9] Hypothermia, initial encounter [T68.XXXA]    Principal Problem:   Pneumonia Active Problems:   Pancytopenia ([place])   Uncontrolled type 2 diabetes mellitus with hyperglycemia, without long-term current use of insulin (HCC)   Essential hypertension   Chronic renal disease, stage 3, moderately decreased glomerular filtration rate between 30-59 mL/min/1.73 square meter (HCC)   Septic shock (HCC)      Past Medical History:  Diagnosis Date   Arthritis    Asthma    Cancer ([place])    Rectal    Chronic renal disease, stage 3, moderately decreased glomerular filtration rate between 30-59 mL/min/1.73 square meter ([place]) 04/27/2014   Depression    Diabetes mellitus    years   GERD (gastroesophageal reflux disease)    Gout    History of recurrent UTIs    Hypertension    Iron deficiency 04/27/2014   Lung cancer ([place])    Mental retardation    stopped school at 9th grade    Numbness and tingling in hands    x several months    Rectal cancer ([place])    Seizures ([place])    more than 4 yrs since last seizure. UNknown etiology   Shortness of breath    with exertion   Vitamin B 12 deficiency 02/01/2015   Incidentally found without antibody testing.      Past Surgical History:  Procedure Laterality Date   ABDOMINAL HYSTERECTOMY     ABDOMINAL PERINEAL BOWEL RESECTION N/A 07/26/2013   Procedure:  ABDOMINAL PERINEAL RESECTION;  Surgeon: Jamesetta So, MD;  Location: [facility];  Service: General;  Laterality: N/A;   CATARACT EXTRACTION W/PHACO Left 11/12/2021   Procedure: CATARACT EXTRACTION PHACO AND INTRAOCULAR LENS PLACEMENT ([place]);  Surgeon: Baruch Goldmann, MD;  Location: [facility];  Service: Ophthalmology;  Laterality: Left;  CDE 6.24   CATARACT EXTRACTION W/PHACO Right 11/26/2021   Procedure: CATARACT EXTRACTION PHACO AND INTRAOCULAR LENS PLACEMENT ([facility]);  Surgeon: Baruch Goldmann, MD;  Location: [facility];  Service: Ophthalmology;  Laterality: Right;  CDE:8.67   COLON SURGERY  2015   bowel resection/w colostomy   COLONOSCOPY N/A 02/04/2013   Procedure: COLONOSCOPY;  Surgeon: Rogene Houston, MD;  Location[facility];  Service: Endoscopy;  Laterality: N/A;  225   COLONOSCOPY N/A 05/12/2014   Procedure: COLONOSCOPY;  Surgeon: Rogene Houston, MD;  [facility];  Service: Endoscopy;  Laterality: N/A;  1030   COLONOSCOPY WITH ESOPHAGOGASTRODUODENOSCOPY (EGD) N/A 01/14/2013   Procedure: COLONOSCOPY WITH ESOPHAGOGASTRODUODENOSCOPY (EGD);  Surgeon: Rogene Houston, MD;  [facility];  Service: Endoscopy;  Laterality: N/A;  250-moved to 315 Ann to notify pt   COLOSTOMY Left 07/26/2013   Procedure: COLOSTOMY;  Surgeon: Jamesetta So, MD;  Location: [facility];  Service: General;  Laterality: Left;   EUS N/A 02/18/2013   Procedure: LOWER ENDOSCOPIC ULTRASOUND (EUS);  Surgeon: Milus Banister, MD;  Location: [facility];  Service: Endoscopy;  Laterality: N/A;   FLEXIBLE SIGMOIDOSCOPY N/A 07/26/2013   Procedure: FLEXIBLE SIGMOIDOSCOPY;  Surgeon: Jamesetta So, MD;  Location: [facility];  Service: General;  Laterality: N/A;   LYMPH NODE DISSECTION Left 10/26/2014   Procedure: LYMPH NODE DISSECTION;  Surgeon: [name], MD;  Location: [facility];  Service: Thoracic;  Laterality: Left;   MULTIPLE EXTRACTIONS WITH ALVEOLOPLASTY N/A 11/23/2012   Procedure: MULTIPLE EXTRACION 1, 2, 4, 5, 6, 7, 8, 9, 10, 11, 12, 13, 14, 17, 18, 20, 23, 24, 25, 26, 28, 29, 32 WITH  ALVEOLOPLASTY, REMOVE BILATERAL TORI;  Surgeon: Gae Bon, DDS;  Location: [facility];  Service: Oral Surgery;  Laterality: N/A;   PORTACATH PLACEMENT Left 01/02/2015   Procedure: INSERTION PORT-A-CATH;  Surgeon: Aviva Signs, MD;  Location[facility];  Service: General;  Laterality: Left;   SALPINGOOPHORECTOMY Bilateral 07/26/2013   Procedure: SALPINGO OOPHORECTOMY;  Surgeon: Jamesetta So, MD;  Location[facility];  Service: General;  Laterality: Bilateral;   SEGMENTECOMY Left 10/26/2014   Procedure: LEFT LOWER LOBE SUPERIOR SEGMENTECTOMY;  Surgeon: [name], MD;  Location: [place] [facility];  Service: Thoracic;  Laterality: Left;   SUPRACERVICAL ABDOMINAL HYSTERECTOMY N/A 07/26/2013   Procedure: HYSTERECTOMY SUPRACERVICAL ABDOMINAL ;  Surgeon: Jamesetta So, MD;  Location[facility];  Service: General;  Laterality: N/A;   VIDEO ASSISTED THORACOSCOPY Left 10/26/2014   Procedure: LEFT VIDEO ASSISTED THORACOSCOPY;  Surgeon: [name], MD;  Location: [facility];  Service: Thoracic;  Laterality: Left;       HPI  from the history and physical done on the day of admission:    [name]  is a 72 y.o. 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, status post prior chemo and radiation therapy, CKD 3A, chronic anemia,  chronic thrombocytopenia, history of seizures, hypertension, DM, obesity and B12 deficiency who presents to the ED from [facility] SNF facility with hypothermia and decreased responsiveness--- in the ED patient is found to be hypothermic with Temperature of 66 F -Required Bair hugger -Additional history obtained from state appointed legal guardian at bedside [name] -Patient apparently is usually conversational and interactive ambulates short distances with a walker long distances with wheelchair until recently,   -Chest x-ray in the ED suggested right-sided pneumonia -UA suggest possible UTI -TSH is 12.95 -Potassium is  2.7 sodium is 131 chloride is 88, -Creatinine is 1.70 which is close to her baseline -WBC is 2.9 hemoglobin is 9.7 hematocrit is 30.5 and platelets are 79 (platelets were 145 04/09/2022) -Hgb was 7.9 on 03/25/2022 and patient received iron infusion at the time -Serum iron is 91 -Folate is low at 4.3 -Mag 1.4     Hospital Course:   Brief Narrative:  - 72 y.o. 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, status post prior chemo and radiation therapy, CKD 3A, chronic anemia,  chronic thrombocytopenia, history of seizures, hypertension, DM, obesity and B12 deficiency  -Transitioned to comfort care on 04/30/2022 -Awaiting transfer to residential hospice house   -Assessment and Plan: 1)Severe sepsis with  septic shock - secondary to presumed right-sided pneumonia and UTI---- -Weaned off IV Levophed --  -Weaned off warming blanket -Treated with with IV fluids, treat GERD with IV Vanco, azithromycin and cefepime -Blood and urine cultures NGTD -Leukopenia has resolved, WBC has normalized 04/30/22 -Bedside conference with palliative care provider, patient's appointed state guardian [name] --Transitioned to comfort care on 04/30/2022 -Awaiting transfer to residential hospice house   2) acute metabolic encephalopathy--- due to #1 above Additional history obtained from state appointed legal guardian at bedside [name] -Patient apparently is usually conversational and interactive ambulates short distances with a walker long distances with wheelchair until recently, 04/30/22 -Altered mentation persist, patient is largely nonverbal mostly lethargic -Treat #1 above   3) acute on chronic anemia and Thrombocytopenia-- longstanding history of chronic anemia and chronic thrombocytopenia  --Patient recently got iron infusions around 03/25/2022, she was scheduled to start getting Aranesp shots soon -platelets were 145 on 04/09/2022) -Hgb  was 7.9 on 03/25/2022 and patient received iron infusion at the time -Serum iron is 91 -Folate is low at 4.3 04/30/22 -Hgb greater than 9 from 7.2 after transfusion of 1 unit of PRBC on 04/26/2022 -Platelets trending down -No acute bleeding concerns at this time   4) hypokalemia/hyponatremia/hypochloremia/hypomagnesemia--  -No vomiting or diarrhea -Replaced and normalized   5) hypothyroidism---TSH is 12.95 -May be contributing to hypothermia -T3 is low at 1.4 -T4 is 0.69 which is borderline low normal    6)Aki on CKD stage -3A   --- -Creatinine trending up due to hypotension and dehydration in the setting of severe sepsis with septic shock 04/30/22 -Creatinine continues to trend up most likely due to recent hemodynamic instability -Discussed with nephrologist [name]-- -renal ultrasound shows atrophic left kidney, no hydronephrosis --Bedside conference with palliative care provider, patient's appointed state guardian [name] --Transitioned to comfort care on 04/30/2022 -Awaiting transfer to residential hospice house   7)Stage IV Rectal Cancer with oligometastatic to the lung, status post prior chemo and radiation therapy----recent surveillance showed no evidence of recurrence ---Transitioned to comfort care on 04/30/2022   8) history of seizures--- no recent seizures reported -Elevated Dilantin level was noted -Pharmacist adjusted dilantin   9)Bradycardia----suspect related to hypothermia and metoprolol use -Hold metoprolol --Transitioned to comfort care on 04/30/2022   10)DM2- -comfort care   11)Social/Ethics-discussed with state appointed legal guardian [name] --Patient is now DNR/DNI -Palliative care consult appreciated   12)Dilantin toxicity--- -Elevated Dilantin level was noted -Pharmacist adjusted dilantin -   13)Dysphagia--- speech pathologist evaluation appreciated, recommends n.p.o. status f    14) acute hypoxic respiratory  failure -Patient received IV Lasix, and bronchodilators Chest x-ray noted with bilateral pleural effusions Bedside conference with palliative care provider, patient's appointed state guardian [name] --Transitioned to comfort care on 04/30/2022 -Awaiting transfer to residential hospice house     Disposition: The patient is from: SNF              Anticipated d/c is to:  SNF versus residential hospice  Code Status :  -  Code Status: DNR    Family Communication:   Discussed with  state appointed legal guardian [name]  Discharge Condition: Overall prognosis is grave   Consults obtained -palliative care Diet and Activity recommendation:  As advised  Discharge Instructions    Discharge Instructions     Call MD for:  difficulty breathing, headache or visual disturbances   Complete by: As directed    Call  MD for:  persistant nausea and vomiting   Complete by: As directed    Call MD for:  severe uncontrolled pain   Complete by: As directed    Call MD for:  temperature >100.4   Complete by: As directed    Diet general   Complete by: As directed    Discharge instructions   Complete by: As directed    Discharged to residential hospice with full comfort care   Increase activity slowly   Complete by: As directed    No dressing needed   Complete by: As directed        Discharge Medications     Allergies as of 04/30/2022   No Known Allergies      Medication List     STOP taking these medications    Accu-Chek Aviva Plus test strip Generic drug: glucose blood   allopurinol 300 MG tablet Commonly known as: ZYLOPRIM   amLODipine 5 MG tablet Commonly known as: NORVASC   ascorbic acid 500 MG tablet Commonly known as: VITAMIN C   aspirin EC 81 MG tablet   calcium carbonate 500 MG chewable tablet Commonly known as: Tums   Cholecalciferol 25 MCG (1000 UT) Tbdp   diclofenac Sodium 1 % Gel Commonly known as: VOLTAREN   docusate sodium 100 MG  capsule Commonly known as: COLACE   donepezil 5 MG tablet Commonly known as: ARICEPT   escitalopram 10 MG tablet Commonly known as: LEXAPRO   ferrous sulfate 325 (65 FE) MG tablet   fish oil-omega-3 fatty acids 1000 MG capsule   Fluticasone-Salmeterol 250-50 MCG/DOSE Aepb Commonly known as: ADVAIR   furosemide 40 MG tablet Commonly known as: LASIX   insulin lispro 100 UNIT/ML injection Commonly known as: HUMALOG   metFORMIN 1000 MG tablet Commonly known as: GLUCOPHAGE   metoprolol 200 MG 24 hr tablet Commonly known as: TOPROL-XL   montelukast 10 MG tablet Commonly known as: SINGULAIR   NovoFine Autocover 30G X 8 MM Misc Generic drug: Insulin Pen Needle   Os-Cal Calcium + D3 500-5 MG-MCG Tabs Generic drug: Calcium Carb-Cholecalciferol   pantoprazole 40 MG tablet Commonly known as: PROTONIX   phenytoin 100 MG ER capsule Commonly known as: DILANTIN   pravastatin 40 MG tablet Commonly known as: PRAVACHOL   traZODone 50 MG tablet Commonly known as: DESYREL   Unistik 3 Comfort Misc       TAKE these medications    ondansetron 4 MG disintegrating tablet Commonly known as: ZOFRAN-ODT Take 1 tablet (4 mg total) by mouth every 6 (six) hours as needed for nausea.   polyvinyl alcohol 1.4 % ophthalmic solution Commonly known as: LIQUIFILM TEARS Place 1 drop into both eyes 4 (four) times daily as needed for dry eyes.   scopolamine 1 MG/3DAYS Commonly known as: TRANSDERM-SCOP Place 1 patch (1.5 mg total) onto the skin every 3 (three) days. Start taking on: May 03, 2022               Discharge Care Instructions  (From admission, onward)           Start     Ordered   04/30/22 0000  No dressing needed        04/30/22 1538            Major procedures and Radiology Reports - PLEASE review detailed and final reports for all details, in brief -   US RENAL  Result Date: 04/30/2022 CLINICAL DATA:  Acute renal injury EXAM: RENAL / URINARY  TRACT ULTRASOUND COMPLETE COMPARISON:  None Available. FINDINGS: Right Kidney: Renal measurements: 11.8 x 5.7 x 5.1 = volume: 179 mL. Echogenicity within normal limits. No mass or hydronephrosis visualized. Left Kidney: Renal measurements: 6.7 x 3.1 x 3.9 = volume: 42 mL. Echogenicity within normal limits. No mass or hydronephrosis visualized. Bladder: Nondistended Other: None. IMPRESSION: 1. Atrophic LEFT kidney. 2. No hydronephrosis Electronically Signed   By: Suzy Bouchard M.D.   On: 04/30/2022 11:25   DG CHEST PORT 1 VIEW  Result Date: 04/30/2022 CLINICAL DATA:  Dyspnea EXAM: PORTABLE CHEST 1 VIEW COMPARISON:  04/29/2022 FINDINGS: Bibasilar consolidation. Large pleural effusions. Pulmonary vascular congestion. No pneumothorax identified. Left-sided Port-A-Cath tip mid SVC. IMPRESSION: Bilateral pleural effusions.  Bibasilar consolidation. Electronically Signed   By: Sammie Bench M.D.   On: 04/30/2022 07:02   DG CHEST PORT 1 VIEW  Result Date: 04/29/2022 CLINICAL DATA:  Dyspnea EXAM: PORTABLE CHEST 1 VIEW COMPARISON:  04/27/2022 FINDINGS: Very low volume AP portable examination. Left chest port catheter. Esophagogastric tube with tip and side below the diaphragm. Probable small, layering bilateral pleural effusions. No new airspace opacity. Heart and mediastinum unremarkable. IMPRESSION: 1. Very low volume AP portable examination. Probable small, layering bilateral pleural effusions. No new airspace opacity. 2. Esophagogastric tube with tip and side below the diaphragm. Electronically Signed   By: Delanna Ahmadi M.D.   On: 04/29/2022 13:11   ECHOCARDIOGRAM COMPLETE  Result Date: 04/28/2022    ECHOCARDIOGRAM REPORT   Patient Name:   [name] Date of Exam: 04/28/2022 Medical Rec #:  [id number]        Height:       66.0 in Accession #:    [id number]       Weight:       254.2 lb Date of Birth:  03-22-1950        BSA:          2.214 m Patient Age:    51 years         BP:           207/89 mmHg  Patient Gender: F                HR:           69 bpm. Exam Location:  [facility] Procedure: 2D Echo, Color Doppler and Cardiac Doppler Indications:    R06.9 DOE  History:        Patient has prior history of Echocardiogram examinations, most                 recent 10/03/2021. Risk Factors:Hypertension and Diabetes.  Sonographer:    Raquel Sarna Senior RDCS Referring Phys: [phone] [facility]  Sonographer Comments: Technically difficult study due to patient body habitus and mental status. Apicals foreshortened, had to perform over the breast. IMPRESSIONS  1. Left ventricular ejection fraction, by estimation, is 60 to 65%. The left ventricle has normal function. The left ventricle has no regional wall motion abnormalities. Left ventricular diastolic parameters are consistent with Grade I diastolic dysfunction (impaired relaxation).  2. Right ventricular systolic function is normal. The right ventricular size is normal. There is moderately elevated pulmonary artery systolic pressure. The estimated right ventricular systolic pressure is 62.8 mmHg.  3. Left atrial size was mildly dilated.  4. The mitral valve is normal in structure. Trivial mitral valve regurgitation. No evidence of mitral stenosis.  5. The aortic valve is normal in structure. Aortic valve regurgitation is not visualized. No aortic stenosis is present.  6. The inferior vena cava is normal in size with greater than 50% respiratory variability, suggesting right atrial pressure of 3 mmHg. FINDINGS  Left Ventricle: Left ventricular ejection fraction, by estimation, is 60 to 65%. The left ventricle has normal function. The left ventricle has no regional wall motion abnormalities. The left ventricular internal cavity size was normal in size. There is  no left ventricular hypertrophy. Left ventricular diastolic parameters are consistent with Grade I diastolic dysfunction (impaired relaxation). Right Ventricle: The right ventricular size is normal. No increase in  right ventricular wall thickness. Right ventricular systolic function is normal. There is moderately elevated pulmonary artery systolic pressure. The tricuspid regurgitant velocity is 2.81 m/s, and with an assumed right atrial pressure of 15 mmHg, the estimated right ventricular systolic pressure is 58.8 mmHg. Left Atrium: Left atrial size was mildly dilated. Right Atrium: Right atrial size was normal in size. Pericardium: There is no evidence of pericardial effusion. Mitral Valve: The mitral valve is normal in structure. Trivial mitral valve regurgitation. No evidence of mitral valve stenosis. Tricuspid Valve: The tricuspid valve is normal in structure. Tricuspid valve regurgitation is mild . No evidence of tricuspid stenosis. Aortic Valve: The aortic valve is normal in structure. Aortic valve regurgitation is not visualized. No aortic stenosis is present. Pulmonic Valve: The pulmonic valve was normal in structure. Pulmonic valve regurgitation is not visualized. No evidence of pulmonic stenosis. Aorta: The aortic root is normal in size and structure. Venous: The inferior vena cava is normal in size with greater than 50% respiratory variability, suggesting right atrial pressure of 3 mmHg. IAS/Shunts: No atrial level shunt detected by color flow Doppler.  LEFT VENTRICLE PLAX 2D LVIDd:         4.40 cm   Diastology LVIDs:         2.90 cm   LV e' medial:    6.31 cm/s LV PW:         0.90 cm   LV E/e' medial:  13.3 LV IVS:        0.90 cm   LV e' lateral:   7.40 cm/s LVOT diam:     2.00 cm   LV E/e' lateral: 11.3 LV SV:         68 LV SV Index:   31 LVOT Area:     3.14 cm  RIGHT VENTRICLE RV S prime:     7.62 cm/s TAPSE (M-mode): 2.0 cm LEFT ATRIUM             Index LA diam:        4.40 cm 1.99 cm/m LA Vol (A2C):   49.6 ml 22.40 ml/m LA Vol (A4C):   79.9 ml 36.08 ml/m LA Biplane Vol: 67.5 ml 30.48 ml/m  AORTIC VALVE LVOT Vmax:   106.00 cm/s LVOT Vmean:  63.900 cm/s LVOT VTI:    0.216 m  AORTA Ao Root diam: 2.60 cm Ao  Asc diam:  2.90 cm MITRAL VALVE                TRICUSPID VALVE MV Area (PHT): 4.96 cm     TR Peak grad:   31.6 mmHg MV Decel Time: 153 msec     TR Vmax:        281.00 cm/s MV E velocity: 83.90 cm/s MV A velocity: 122.00 cm/s  SHUNTS MV E/A ratio:  0.69         Systemic VTI:  0.22 m  Systemic Diam: 2.00 cm Cherlynn Kaiser MD Electronically signed by Cherlynn Kaiser MD Signature Date/Time: 04/28/2022/3:12:16 PM    Final    DG CHEST PORT 1 VIEW  Result Date: 04/27/2022 CLINICAL DATA:  72 year old female status post nasogastric tube placement. EXAM: PORTABLE CHEST 1 VIEW COMPARISON:  Chest x-ray 04/24/2022. FINDINGS: Nasogastric tube extends into the distal body of the stomach. Left-sided subclavian single-lumen power porta cath with tip terminating in the region of the innominate vein. Stomach is incompletely imaged, lung volumes are very low. Bibasilar opacities which may reflect areas of atelectasis and/or consolidation. Large right and small left pleural effusions. Diffuse interstitial prominence and patchy ill-defined airspace disease noted throughout both lungs. Heart size is normal. Upper mediastinal contours are within normal limits. IMPRESSION: 1. Support apparatus, as above. 2. Large right and small left pleural effusions with bibasilar opacities which may reflect areas of atelectasis and/or consolidation. 3. Marked worsening aeration in the lungs, concerning for progressive multilobar bilateral pneumonia. Electronically Signed   By: Vinnie Langton M.D.   On: 04/27/2022 08:44   DG Abd Portable 1V  Result Date: 04/26/2022 CLINICAL DATA:  Encounter for nasogastric tube placement. EXAM: PORTABLE ABDOMEN - 1 VIEW COMPARISON:  CT 07/17/2021 FINDINGS: Nasogastric tube extends into the left upper abdomen. The tip is in the expected region of the gastric body. Again noted is a left subclavian Port-A-Cath with the tip in the left innominate vein. Elevation of the right hemidiaphragm.  Bowel gas in the abdomen. IMPRESSION: 1. Nasogastric tube is in the left upper abdomen likely in the gastric body. 2. Stable appearance of the Port-A-Cath with the tip in the left innominate vein region. Electronically Signed   By: Markus Daft M.D.   On: 04/26/2022 12:13   CT Hip Right Wo Contrast  Result Date: 04/24/2022 CLINICAL DATA:  Metastatic rectal adenocarcinoma, hip trauma. Renal insufficiency. Radiation for rectal cancer. EXAM: CT OF THE RIGHT HIP WITHOUT CONTRAST TECHNIQUE: Multidetector CT imaging of the right hip was performed according to the standard protocol. Multiplanar CT image reconstructions were also generated. RADIATION DOSE REDUCTION: This exam was performed according to the departmental dose-optimization program which includes automated exposure control, adjustment of the mA and/or kV according to patient size and/or use of iterative reconstruction technique. COMPARISON:  CT pelvis 07/17/2021 and hip radiographs 04/24/2022 FINDINGS: Bones/Joint/Cartilage No discrete femoral neck fracture is readily apparent. There is spurring along the femoral head contributing to the recent appearance at conventional radiography. Widespread scattered sclerosis and lucency in the bony pelvis, this is been present on numerous prior exams, some of this may reflect response to radiation therapy there is also suspected underlying diffuse idiopathic skeletal hyperostosis and possible renal osteodystrophy contributing. Paget's disease has been raised as a possibility for some of the widespread skeletal appearance, however the widespread distribution would be somewhat unusual for Paget's disease. In any case there is no substantial change on today's exam compared 07/17/2021 to indicate an acute process. Chronically fragmented anterior acetabular osteophytes noted. No hip effusion. Ligaments Suboptimally assessed by CT. Muscles and Tendons Calcific tendinopathy along the proximal hamstring tendons. Soft tissues  Foley catheter in the urinary bladder. Continued soft tissue density in the vicinity of the rectum or rectal pouch, some of this may be treatment related in this is only partially included on today's exam. IMPRESSION: 1. No femoral neck fracture is readily apparent. There is spurring along the femoral head contributing to the recent appearance at conventional radiography. 2. Widespread scattered sclerosis and lucency in the  bony pelvis, this is been present on numerous prior exams, some of this may reflect response to radiation therapy. There is also suspected underlying diffuse idiopathic skeletal hyperostosis and possible renal osteodystrophy contributing. Paget's disease has been raised as a possibility for some of the widespread skeletal appearance, however the widespread distribution would be somewhat unusual for Paget's disease. In any case there is no substantial change in the right femur or right hemipelvis on today's exam compared to 07/17/2021 to indicate an acute process. 3. Calcific tendinopathy along the proximal hamstring tendons. 4. Continued soft tissue density in the vicinity of the rectum or rectal pouch, some of this may be treatment related in this is only partially included on today's exam. Electronically Signed   By: Van Clines M.D.   On: 04/24/2022 17:45   CT Head Wo Contrast  Result Date: 04/24/2022 CLINICAL DATA:  Head trauma.  Intracranial injury suspected. EXAM: CT HEAD WITHOUT CONTRAST TECHNIQUE: Contiguous axial images were obtained from the base of the skull through the vertex without intravenous contrast. RADIATION DOSE REDUCTION: This exam was performed according to the departmental dose-optimization program which includes automated exposure control, adjustment of the mA and/or kV according to patient size and/or use of iterative reconstruction technique. COMPARISON:  MRI 11/19/2005 FINDINGS: Brain: Age related atrophy. No evidence of old or acute focal infarction, mass  lesion, hemorrhage, hydrocephalus or extra-axial collection. Vascular: No abnormal vascular finding. Skull: Normal Sinuses/Orbits: Clear/normal Other: None IMPRESSION: No acute or traumatic finding. Age related atrophy. Electronically Signed   By: Nelson Chimes M.D.   On: 04/24/2022 17:39   DG Hip Unilat W or Wo Pelvis 2-3 Views Right  Result Date: 04/24/2022 CLINICAL DATA:  Fall EXAM: DG HIP (WITH OR WITHOUT PELVIS) 2-3V RIGHT COMPARISON:  07/17/2021 FINDINGS: Suspect acute nondisplaced fracture of the right femoral neck. Bilateral hip joints intact without dislocation. 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 Degenerative changes of both SI joints. No pelvic diastasis. IMPRESSION: 1. Suspect nondisplaced right femoral neck fracture. Further evaluation can be obtained with CT. 2. 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 Electronically Signed   By: Davina Poke D.O.   On: 04/24/2022 13:32   DG Shoulder Left  Result Date: 04/24/2022 CLINICAL DATA:  Fall, fever EXAM: LEFT SHOULDER - 2+ VIEW COMPARISON:  11/30/2017 FINDINGS: Osseous demineralization. AC joint alignment normal. Mild glenohumeral degenerative changes. Humerus approximates the undersurface of the acromion consistent with chronic rotator cuff tear. Small calcified nodule identified adjacent to humeral head, suspect rotator cuff calcification. No acute fracture, dislocation, or bone destruction. Visualized ribs intact. IMPRESSION: LEFT glenohumeral degenerative changes and evidence of chronic rotator cuff tear. Suspected calcific tendinitis of the rotator cuff. No acute osseous abnormalities. Electronically Signed   By: Lavonia Dana M.D.   On: 04/24/2022 13:25   DG Chest Port 1 View  Result Date: 04/24/2022 CLINICAL DATA:  Pneumonia, shortness of breath, treated with antibiotics, urinary burning began yesterday, diagnosed with UTI and started on antibiotics EXAM: PORTABLE CHEST 1 VIEW  COMPARISON:  Portable exam 1059 hours compared to 06/03/2018 FINDINGS: LEFT subclavian Port-A-Cath with tip projecting over LEFT brachiocephalic vein. Normal heart size and mediastinal contours. Small to moderate RIGHT pleural effusion and basilar atelectasis with underlying eventration of RIGHT diaphragm noted. Small LEFT basilar effusion. Patchy RIGHT upper lobe infiltrate. No pneumothorax or acute osseous findings. IMPRESSION: Bibasilar effusions and atelectasis greater on RIGHT. Patchy RIGHT upper lobe infiltrate. Electronically Signed   By:  Lavonia Dana M.D.   On: 04/24/2022 11:08    Micro Results   Recent Results (from the past 240 hour(s))  Culture, blood (Routine x 2)     Status: None   Collection Time: 04/24/22 11:35 AM   Specimen: BLOOD  Result Value Ref Range Status   Specimen Description BLOOD BLOOD LEFT HAND  Final   Special Requests   Final    BOTTLES DRAWN AEROBIC AND ANAEROBIC Blood Culture adequate volume   Culture   Final    NO GROWTH 5 DAYS Performed at [facility], [address] [place], [place] [address]    Report Status 04/29/2022 FINAL  Final  Urine Culture     Status: None   Collection Time: 04/24/22 11:50 AM   Specimen: Urine, Clean Catch  Result Value Ref Range Status   Specimen Description   Final    URINE, CLEAN CATCH Performed at [facility], [address] [place], [place] [address]    Special Requests   Final    NONE Performed at [facility], [address], [place], [place] [address]    Culture   Final    NO GROWTH Performed at [facility], [place] [address] [place], [place] [address]    Report Status 04/25/2022 FINAL  Final  MRSA Next Gen by PCR, Nasal     Status: Abnormal   Collection Time: 04/24/22  6:38 PM   Specimen: Nasal Mucosa; Nasal Swab  Result Value Ref Range Status   MRSA by PCR Next Gen DETECTED (A) NOT DETECTED Final    Comment:        The GeneXpert MRSA Assay (FDA approved for NASAL specimens only), is one component of  a comprehensive MRSA colonization surveillance program. It is not intended to diagnose MRSA infection nor to guide or monitor treatment for MRSA infections. RESULT CALLED TO, READ BACK BY AND VERIFIED WITH: FERO,J @0025  BY MATTHEWS, B 2.8.2024 Performed at [facility], [address], [place], [place] [address]     Today   Subjective    [name] today has no  fevers - 04/30/22 -Bedside conference with palliative care provider, patient's appointed state guardian [name] --Transitioned to comfort care on 04/30/2022 -Awaiting transfer to residential hospice house - Patient is unable to eat or drink         Patient has been seen and examined prior to discharge   Objective   Blood pressure (!) 146/44, pulse 78, temperature (!) 97.5 F (36.4 C), temperature source Axillary, resp. rate 14, height 5\' 6"  (1.676 m), weight 115.8 kg, SpO2 93 %.   Intake/Output Summary (Last 24 hours) at 04/30/2022 1538 Last data filed at 04/29/2022 2323 Gross per 24 hour  Intake 407.34 ml  Output 250 ml  Net 157.34 ml    Exam Physical Examination: General appearance - lethargic, in no distress  Mental status -very lethargic, mostly unresponsive  eyes - sclera anicteric Neck - supple, no JVD elevation , Chest -diminished breath sounds, no significant wheezing Heart - S1 and S2 normal, regular ,  Abdomen - soft, nontender, nondistended, +BS, colostomy in situ Neurological - -sleepy and disoriented, mostly unresponsive, appears to be moving all extremities well spontaneously Extremities - +ve pedal edema noted, intact peripheral pulses  Skin - warm, dry   Data Review   CBC w Diff:  Lab Results  Component Value Date   WBC 8.9 04/28/2022   HGB 9.9 (L) 04/28/2022   HCT 31.3 (L) 04/28/2022  PLT 67 (L) 04/28/2022   LYMPHOPCT 10 04/24/2022   MONOPCT 7 04/24/2022   EOSPCT 4 04/24/2022   BASOPCT 0 04/24/2022    CMP:  Lab Results  Component Value Date   NA 134 (L)  04/30/2022   K 4.5 04/30/2022   CL 102 04/30/2022   CO2 21 (L) 04/30/2022   BUN 55 (H) 04/30/2022   CREATININE 3.44 (H) 04/30/2022   CREATININE 1.11 (H) 02/08/2013   PROT 5.9 (L) 04/30/2022   ALBUMIN 2.7 (L) 04/30/2022   BILITOT 0.7 04/30/2022   ALKPHOS 251 (H) 04/30/2022   AST 33 04/30/2022   ALT 34 04/30/2022  .  Total Discharge time is about 33 minutes  Roxan Hockey M.D on 04/30/2022 at 3:38 PM  Go to [email or web address] -  for contact info  [facility] - Office  [phone]

## 2022-04-30 NOTE — Discharge Instructions (Signed)
Discharged to residential hospice with full comfort care

## 2022-04-30 NOTE — TOC Transition Note (Signed)
Transition of Care [facility]) - CM/SW Discharge Note   Patient Details  Name: Laurey Salser MRN: [id number] Date of Birth: [birth year]  Transition of Care [facility]) CM/SW Contact:  Shade Flood, LCSW Phone Number: 04/30/2022, 2:54 PM   Clinical Narrative:     Pt accepted for admission to [facility]. Guardian aware and in agreement with transfer.   RN to call report. EMS arranged. There are no other TOC needs for dc.  Final next level of care: [place] Barriers to Discharge: Barriers Resolved   Patient Goals and CMS Choice CMS Medicare.gov Compare Post Acute Care list provided to:: Legal Guardian    Discharge Placement                         Discharge Plan and Services Additional resources added to the After Visit Summary for   In-house Referral: Clinical Social Work   Post Acute Care Choice: Hospice                               Social Determinants of Health ([place]) Interventions SDOH Screenings   Tobacco Use: Medium Risk (04/29/2022)     Readmission Risk Interventions    04/25/2022   11:00 AM  Readmission Risk Prevention Plan  Transportation Screening Complete  HRI or Home Care Consult Complete  Social Work Consult for [place] Planning/Counseling Complete  Palliative Care Screening Complete  Medication Review [employer]) Complete

## 2022-04-30 NOTE — Progress Notes (Signed)
   Bedside conference with palliative care provider, patient's appointed state guardian [name] --Transitioned to comfort care on 04/30/2022 -Awaiting transfer to residential hospice house  Jennifer Hockey, MD

## 2022-04-30 NOTE — TOC Progression Note (Signed)
Transition of Care [facility]) - Progression Note    Patient Details  Name: [name] MRN: [id number] Date of Birth: 02-Feb-1951  Transition of Care [facility]) CM/SW Contact  Shade Flood, LCSW Phone Number: 04/30/2022, 11:35 AM  Clinical Narrative:     Palliative APNP informed TOC that pt's legal guardian requesting referral to [facility].  Referral made to [facility]. Hospice will send an RN to evaluate pt for eligibility.   TOC will follow.  Expected Discharge Plan: [place] Barriers to Discharge: Continued Medical Work up  Expected Discharge Plan and Services In-house Referral: Clinical Social Work   Post Acute Care Choice: Hospice Living arrangements for the past 2 months: [place]                                       Social Determinants of Health (SDOH) Interventions SDOH Screenings   Tobacco Use: Medium Risk (04/29/2022)    Readmission Risk Interventions    04/25/2022   11:00 AM  Readmission Risk Prevention Plan  Transportation Screening Complete  HRI or Home Care Consult Complete  Social Work Consult for [place] Planning/Counseling Complete  Palliative Care Screening Complete  Medication Review Press photographer) Complete

## 2022-04-30 NOTE — Progress Notes (Signed)
Patient discharged to [place] called to [facility] RN. IV's discontinued, catheter intact. Port-a-cath deaccessed needle intact. [facility] [place] to transport patient to awaiting facility.

## 2022-04-30 NOTE — Progress Notes (Signed)
[facility] here to transport patient to [place].

## 2022-04-30 NOTE — Progress Notes (Signed)
Palliative: Ms. Eula Fried is resting quietly in bed.  She appears acutely/chronically ill and very frail, obese.  Her eyes are open, but she does not have meaningful interaction.  She clearly cannot make her needs known.  Her DSS Education officer, museum of almost 2 years, [name], is present at bedside.  As we are meeting, attending hospitalist arrives.  We talk about [name] acute and chronic health concerns.  We talk about the treatment plan and selected labs.  Ms. Eula Fried had a full and meaningful life until a few weeks ago but has experienced a remarkable full decline.  DSS social worker is endorsing comfort and dignity at end-of-life, residential hospice at [employer].  Conference with attending, bedside nursing staff, transition of care team related to patient condition, needs, goals of care, disposition.  Plan: Full comfort care.  End-of-life order set implemented.  Requesting comfort and dignity at end-of-life, residential hospice at [employer], [place]. Prognosis: 10 days or less anticipated.  86 minutes Quinn Axe, NP Palliative medicine team Team phone [phone] Greater than 50% of this time was spent counseling and coordinating care related to the above assessment and plan.

## 2022-04-30 NOTE — Progress Notes (Signed)
PROGRESS NOTE     [name], is a 72 y.o. female, DOB - [birth year], CBJ:[id number]  Admit date - 04/24/2022   Admitting Physician [name], MD  Outpatient Primary MD for the patient is [name], [name]  LOS - 6  Chief Complaint  Patient presents with   Altered Mental Status        Brief Narrative:  - 72 y.o. 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, status post prior chemo and radiation therapy, CKD 3A, chronic anemia,  chronic thrombocytopenia, history of seizures, hypertension, DM, obesity and B12 deficiency  -Transitioned to comfort care on 04/30/2022 -Awaiting transfer to residential hospice house   -Assessment and Plan: 1)Severe sepsis with septic shock - secondary to presumed right-sided pneumonia and UTI---- -Weaned off IV Levophed --  -Weaned off warming blanket -Treated with with IV fluids, treat GERD with IV Vanco, azithromycin and cefepime -Blood and urine cultures NGTD -Leukopenia has resolved, WBC has normalized 04/30/22 -Bedside conference with palliative care provider, patient's appointed state guardian Mr. [name] --Transitioned to comfort care on 04/30/2022 -Awaiting transfer to residential hospice house   2) acute metabolic encephalopathy--- due to #1 above Additional history obtained from state appointed legal guardian at bedside Mr. [name] -Patient apparently is usually conversational and interactive ambulates short distances with a walker long distances with wheelchair until recently, 04/30/22 -Altered mentation persist, patient is largely nonverbal mostly lethargic -Treat #1 above   3) acute on chronic anemia and Thrombocytopenia-- longstanding history of chronic anemia and chronic thrombocytopenia  --Patient recently got iron infusions around 03/25/2022, she was scheduled to start getting Aranesp shots soon -platelets were 145 on 04/09/2022) -Hgb was 7.9 on 03/25/2022 and patient  received iron infusion at the time -Serum iron is 91 -Folate is low at 4.3 -c/n folic acid 1/60/73 -Hgb greater than 9 from 7.2 after transfusion of 1 unit of PRBC on 04/26/2022 -Platelets trending down -No acute bleeding concerns at this time  4) hypokalemia/hyponatremia/hypochloremia/hypomagnesemia--  -No vomiting or diarrhea -Replaced and normalized   5) hypothyroidism---TSH is 12.95 -May be contributing to hypothermia -T3 is low at 1.4 -T4 is 0.69 which is borderline low normal -c/n levothyroxine   6)Aki on CKD stage -3A   --- -Creatinine trending up due to hypotension and dehydration in the setting of severe sepsis with septic shock 04/30/22 -Creatinine continues to trend up most likely due to recent hemodynamic instability -Discussed with nephrologist [name]-- -renal ultrasound shows atrophic left kidney, no hydronephrosis --Bedside conference with palliative care provider, patient's appointed state guardian Mr. [name] --Transitioned to comfort care on 04/30/2022 -Awaiting transfer to residential hospice house   7)Stage IV Rectal Cancer with oligometastatic to the lung, status post prior chemo and radiation therapy----recent surveillance showed no evidence of recurrence ---Transitioned to comfort care on 04/30/2022   8) history of seizures--- no recent seizures reported -Elevated Dilantin level was noted -Pharmacist adjusted dilantin   9)Bradycardia----suspect related to hypothermia and metoprolol use -Hold metoprolol --Transitioned to comfort care on 04/30/2022   10)DM2- -comfort care   11)Social/Ethics-discussed with state appointed legal guardian Mr. [name] --Patient is now DNR/DNI -Palliative care consult appreciated  12)Dilantin toxicity--- -Elevated Dilantin level was noted -Pharmacist adjusted dilantin -  13)Dysphagia--- speech pathologist evaluation appreciated, recommends n.p.o. status for now -NG tube placed on 04/26/2022 to allow for  patient administration and feeding and free water administration  14) acute hypoxic respiratory failure -Patient received IV Lasix, and bronchodilators  Chest x-ray noted with bilateral pleural effusions Bedside conference with palliative care provider, patient's appointed state guardian Mr. [name] --Transitioned to comfort care on 04/30/2022 -Awaiting transfer to residential hospice house  Status is: Inpatient   Remains inpatient appropriate because:    Disposition: The patient is from: SNF              Anticipated d/c is to:  SNF versus residential hospice              Anticipated d/c date is: > 3 days              Patient currently is not medically stable to d/c. Barriers: Not Clinically Stable-   Code Status :  -  Code Status: DNR   Family Communication:   Discussed with  state appointed legal guardian Mr. [name]  DVT Prophylaxis  :   - SCDs  SCDs Start: 04/24/22 1834 Place TED hose Start: 04/24/22 1834   Lab Results  Component Value Date   PLT 67 (L) 04/28/2022   Inpatient Medications  Scheduled Meds:  Chlorhexidine Gluconate Cloth  6 each Topical Q0600   scopolamine  1 patch Transdermal Q72H   sodium chloride flush  3 mL Intravenous Q12H   sodium chloride flush  3 mL Intravenous Q12H   Continuous Infusions:  sodium chloride     PRN Meds:.sodium chloride, acetaminophen **OR** acetaminophen, albuterol, bisacodyl, fentaNYL (SUBLIMAZE) injection, LORazepam, ondansetron **OR** ondansetron (ZOFRAN) IV, sodium chloride flush   Anti-infectives (From admission, onward)    Start     Dose/Rate Route Frequency Ordered Stop   04/27/22 0600  ceFEPIme (MAXIPIME) 2 g in sodium chloride 0.9 % 100 mL IVPB  Status:  Discontinued        2 g 200 mL/hr over 30 Minutes Intravenous Every 24 hours 04/26/22 1140 04/30/22 1224   04/25/22 2200  vancomycin (VANCOREADY) IVPB 1500 mg/300 mL  Status:  Discontinued        1,500 mg 150 mL/hr over 120 Minutes Intravenous Every 48  hours 04/24/22 1618 04/27/22 1500   04/25/22 0600  ceFEPIme (MAXIPIME) 2 g in sodium chloride 0.9 % 100 mL IVPB  Status:  Discontinued        2 g 200 mL/hr over 30 Minutes Intravenous Every 12 hours 04/24/22 1621 04/26/22 1140   04/24/22 1400  vancomycin (VANCOREADY) IVPB 2000 mg/400 mL        2,000 mg 200 mL/hr over 120 Minutes Intravenous  Once 04/24/22 1348 04/24/22 1737   04/24/22 1245  azithromycin (ZITHROMAX) 500 mg in sodium chloride 0.9 % 250 mL IVPB  Status:  Discontinued        500 mg 250 mL/hr over 60 Minutes Intravenous Every 24 hours 04/24/22 1237 04/30/22 1224   04/24/22 1215  ceFEPIme (MAXIPIME) 2 g in sodium chloride 0.9 % 100 mL IVPB        2 g 200 mL/hr over 30 Minutes Intravenous  Once 04/24/22 1213 04/24/22 1418       Subjective: [name] today has no fevers, no emesis,  No chest pain,   04/30/22 -Bedside conference with palliative care provider, patient's appointed state guardian Mr. [name] --Transitioned to comfort care on 04/30/2022 -Awaiting transfer to residential hospice house - Patient is unable to eat or drink  Objective: Vitals:   04/30/22 0800 04/30/22 0900 04/30/22 1000 04/30/22 1100  BP: (!) 142/52 (!) 123/37 (!) 124/45   Pulse: 80 81 77   Resp: 19 14 16  Temp:    (!) 97.5 F (36.4 C)  TempSrc:    Axillary  SpO2: 100% 99% 95%   Weight:      Height:        Intake/Output Summary (Last 24 hours) at 04/30/2022 1227 Last data filed at 04/29/2022 2323 Gross per 24 hour  Intake 407.34 ml  Output 250 ml  Net 157.34 ml   Filed Weights   04/28/22 0354 04/29/22 0500 04/30/22 0653  Weight: 115.3 kg 115.3 kg 115.8 kg    Physical Exam  Physical Examination: General appearance - lethargic, in no distress  Mental status -very lethargic, mostly unresponsive  eyes - sclera anicteric Neck - supple, no JVD elevation , Chest -diminished breath sounds, no significant wheezing Heart - S1 and S2 normal, regular ,  Abdomen - soft,  nontender, nondistended, +BS, colostomy in situ Neurological - -sleepy and disoriented, mostly unresponsive, appears to be moving all extremities well spontaneously Extremities - +ve pedal edema noted, intact peripheral pulses  Skin - warm, dry    Data Reviewed: I have personally reviewed following labs and imaging studies  CBC: Recent Labs  Lab 04/24/22 1135 04/25/22 0455 04/26/22 0856 04/27/22 0700 04/28/22 0352  WBC 2.9* 4.3 5.0  --  8.9  NEUTROABS 2.3  --   --   --   --   HGB 9.7* 7.7* 7.2* 9.1* 9.9*  HCT 30.5* 24.1* 22.7* 28.2* 31.3*  MCV 86.2 87.0 88.3  --  87.4  PLT 79* 75* 75*  --  67*   Basic Metabolic Panel: Recent Labs  Lab 04/24/22 1241 04/25/22 0455 04/26/22 0856 04/27/22 0914 04/28/22 0352 04/29/22 0345 04/30/22 0428  NA  --    < > 135 137 137 135 134*  K  --    < > 3.6 3.1* 3.4* 4.0 4.5  CL  --    < > 98 99 101 102 102  CO2  --    < > 27 22 22  21* 21*  GLUCOSE  --    < > 166* 145* 164* 159* 139*  BUN  --    < > 45* 49* 49* 51* 55*  CREATININE  --    < > 2.61* 3.10* 2.97* 3.00* 3.44*  CALCIUM  --    < > 8.4* 8.8* 9.2 9.1 8.9  MG 1.4*  --   --   --   --   --   --   PHOS  --   --  4.4 4.4  --   --   --    < > = values in this interval not displayed.   GFR: Estimated Creatinine Clearance: 19.7 mL/min (A) (by C-G formula based on SCr of 3.44 mg/dL (H)). Liver Function Tests: Recent Labs  Lab 04/24/22 1135 04/26/22 0856 04/27/22 0914 04/29/22 0345 04/30/22 0428  AST 22  --   --  35 33  ALT 19  --   --  34 34  ALKPHOS 293*  --   --  254* 251*  BILITOT 0.9  --   --  0.9 0.7  PROT 6.6  --   --  6.4* 5.9*  ALBUMIN 3.2* 2.9* 3.1* 3.0* 2.7*   Recent Results (from the past 240 hour(s))  Culture, blood (Routine x 2)     Status: None   Collection Time: 04/24/22 11:35 AM   Specimen: BLOOD  Result Value Ref Range Status   Specimen Description BLOOD BLOOD LEFT HAND  Final   Special Requests   Final  BOTTLES DRAWN AEROBIC AND ANAEROBIC Blood Culture  adequate volume   Culture   Final    NO GROWTH 5 DAYS Performed at [facility], [address] [place], [place] [address]    Report Status 04/29/2022 FINAL  Final  Urine Culture     Status: None   Collection Time: 04/24/22 11:50 AM   Specimen: Urine, Clean Catch  Result Value Ref Range Status   Specimen Description   Final    URINE, CLEAN CATCH Performed at [facility], [address], [place], [place] [address]    Special Requests   Final    NONE Performed at [facility], [address], [place], [place] [address]    Culture   Final    NO GROWTH Performed at [facility], [place] [address] [place], [place] [address]    Report Status 04/25/2022 FINAL  Final  MRSA Next Gen by PCR, Nasal     Status: Abnormal   Collection Time: 04/24/22  6:38 PM   Specimen: Nasal Mucosa; Nasal Swab  Result Value Ref Range Status   MRSA by PCR Next Gen DETECTED (A) NOT DETECTED Final    Comment:        The GeneXpert MRSA Assay (FDA approved for NASAL specimens only), is one component of a comprehensive MRSA colonization surveillance program. It is not intended to diagnose MRSA infection nor to guide or monitor treatment for MRSA infections. RESULT CALLED TO, READ BACK BY AND VERIFIED WITH: FERO,J @0025  BY MATTHEWS, B 2.8.2024 Performed at [facility], [address], [place],  [address]     Radiology Studies: US RENAL  Result Date: 04/30/2022 CLINICAL DATA:  Acute renal injury EXAM: RENAL / URINARY TRACT ULTRASOUND COMPLETE COMPARISON:  None Available. FINDINGS: Right Kidney: Renal measurements: 11.8 x 5.7 x 5.1 = volume: 179 mL. Echogenicity within normal limits. No mass or hydronephrosis visualized. Left Kidney: Renal measurements: 6.7 x 3.1 x 3.9 = volume: 42 mL. Echogenicity within normal limits. No mass or hydronephrosis visualized. Bladder: Nondistended Other: None. IMPRESSION: 1. Atrophic LEFT kidney. 2. No hydronephrosis Electronically Signed   By: Suzy Bouchard M.D.    On: 04/30/2022 11:25   DG CHEST PORT 1 VIEW  Result Date: 04/30/2022 CLINICAL DATA:  Dyspnea EXAM: PORTABLE CHEST 1 VIEW COMPARISON:  04/29/2022 FINDINGS: Bibasilar consolidation. Large pleural effusions. Pulmonary vascular congestion. No pneumothorax identified. Left-sided Port-A-Cath tip mid SVC. IMPRESSION: Bilateral pleural effusions.  Bibasilar consolidation. Electronically Signed   By: Sammie Bench M.D.   On: 04/30/2022 07:02   DG CHEST PORT 1 VIEW  Result Date: 04/29/2022 CLINICAL DATA:  Dyspnea EXAM: PORTABLE CHEST 1 VIEW COMPARISON:  04/27/2022 FINDINGS: Very low volume AP portable examination. Left chest port catheter. Esophagogastric tube with tip and side below the diaphragm. Probable small, layering bilateral pleural effusions. No new airspace opacity. Heart and mediastinum unremarkable. IMPRESSION: 1. Very low volume AP portable examination. Probable small, layering bilateral pleural effusions. No new airspace opacity. 2. Esophagogastric tube with tip and side below the diaphragm. Electronically Signed   By: Delanna Ahmadi M.D.   On: 04/29/2022 13:11   ECHOCARDIOGRAM COMPLETE  Result Date: 04/28/2022    ECHOCARDIOGRAM REPORT   Patient Name:   PARMINDER TRAPANI Date of Exam: 04/28/2022 Medical Rec #:  [id number]        Height:       66.0 in Accession #:    [id number]       Weight:       254.2 lb  Date of Birth:  [birth year]        BSA:          2.214 m Patient Age:    45 years         BP:           207/89 mmHg Patient Gender: F                HR:           69 bpm. Exam Location:  [facility] Procedure: 2D Echo, Color Doppler and Cardiac Doppler Indications:    R06.9 DOE  History:        Patient has prior history of Echocardiogram examinations, most                 recent 10/03/2021. Risk Factors:Hypertension and Diabetes.  Sonographer:    Raquel Sarna Senior RDCS Referring Phys: [id number] [facility]  Sonographer Comments: Technically difficult study due to patient body habitus and mental status.  Apicals foreshortened, had to perform over the breast. IMPRESSIONS  1. Left ventricular ejection fraction, by estimation, is 60 to 65%. The left ventricle has normal function. The left ventricle has no regional wall motion abnormalities. Left ventricular diastolic parameters are consistent with Grade I diastolic dysfunction (impaired relaxation).  2. Right ventricular systolic function is normal. The right ventricular size is normal. There is moderately elevated pulmonary artery systolic pressure. The estimated right ventricular systolic pressure is 35.5 mmHg.  3. Left atrial size was mildly dilated.  4. The mitral valve is normal in structure. Trivial mitral valve regurgitation. No evidence of mitral stenosis.  5. The aortic valve is normal in structure. Aortic valve regurgitation is not visualized. No aortic stenosis is present.  6. The inferior vena cava is normal in size with greater than 50% respiratory variability, suggesting right atrial pressure of 3 mmHg. FINDINGS  Left Ventricle: Left ventricular ejection fraction, by estimation, is 60 to 65%. The left ventricle has normal function. The left ventricle has no regional wall motion abnormalities. The left ventricular internal cavity size was normal in size. There is  no left ventricular hypertrophy. Left ventricular diastolic parameters are consistent with Grade I diastolic dysfunction (impaired relaxation). Right Ventricle: The right ventricular size is normal. No increase in right ventricular wall thickness. Right ventricular systolic function is normal. There is moderately elevated pulmonary artery systolic pressure. The tricuspid regurgitant velocity is 2.81 m/s, and with an assumed right atrial pressure of 15 mmHg, the estimated right ventricular systolic pressure is 73.2 mmHg. Left Atrium: Left atrial size was mildly dilated. Right Atrium: Right atrial size was normal in size. Pericardium: There is no evidence of pericardial effusion. Mitral Valve: The  mitral valve is normal in structure. Trivial mitral valve regurgitation. No evidence of mitral valve stenosis. Tricuspid Valve: The tricuspid valve is normal in structure. Tricuspid valve regurgitation is mild . No evidence of tricuspid stenosis. Aortic Valve: The aortic valve is normal in structure. Aortic valve regurgitation is not visualized. No aortic stenosis is present. Pulmonic Valve: The pulmonic valve was normal in structure. Pulmonic valve regurgitation is not visualized. No evidence of pulmonic stenosis. Aorta: The aortic root is normal in size and structure. Venous: The inferior vena cava is normal in size with greater than 50% respiratory variability, suggesting right atrial pressure of 3 mmHg. IAS/Shunts: No atrial level shunt detected by color flow Doppler.  LEFT VENTRICLE PLAX 2D LVIDd:         4.40 cm   Diastology LVIDs:  2.90 cm   LV e' medial:    6.31 cm/s LV PW:         0.90 cm   LV E/e' medial:  13.3 LV IVS:        0.90 cm   LV e' lateral:   7.40 cm/s LVOT diam:     2.00 cm   LV E/e' lateral: 11.3 LV SV:         68 LV SV Index:   31 LVOT Area:     3.14 cm  RIGHT VENTRICLE RV S prime:     7.62 cm/s TAPSE (M-mode): 2.0 cm LEFT ATRIUM             Index LA diam:        4.40 cm 1.99 cm/m LA Vol (A2C):   49.6 ml 22.40 ml/m LA Vol (A4C):   79.9 ml 36.08 ml/m LA Biplane Vol: 67.5 ml 30.48 ml/m  AORTIC VALVE LVOT Vmax:   106.00 cm/s LVOT Vmean:  63.900 cm/s LVOT VTI:    0.216 m  AORTA Ao Root diam: 2.60 cm Ao Asc diam:  2.90 cm MITRAL VALVE                TRICUSPID VALVE MV Area (PHT): 4.96 cm     TR Peak grad:   31.6 mmHg MV Decel Time: 153 msec     TR Vmax:        281.00 cm/s MV E velocity: 83.90 cm/s MV A velocity: 122.00 cm/s  SHUNTS MV E/A ratio:  0.69         Systemic VTI:  0.22 m                             Systemic Diam: 2.00 cm Cherlynn Kaiser MD Electronically signed by Cherlynn Kaiser MD Signature Date/Time: 04/28/2022/3:12:16 PM    Final      Scheduled Meds:  Chlorhexidine  Gluconate Cloth  6 each Topical Q0600   scopolamine  1 patch Transdermal Q72H   sodium chloride flush  3 mL Intravenous Q12H   sodium chloride flush  3 mL Intravenous Q12H   Continuous Infusions:  sodium chloride      LOS: 6 days   Roxan Hockey M.D on 04/30/2022 at 12:27 PM  Go to [email or web address] - for contact info  [facility] - Office  [phone]  If 7PM-7AM, please contact night-coverage [email or web address] 04/30/2022, 12:27 PM

## 2022-05-02 ENCOUNTER — Encounter (HOSPITAL_COMMUNITY): Payer: Self-pay | Admitting: Internal Medicine

## 2022-05-07 ENCOUNTER — Inpatient Hospital Stay: Payer: 59

## 2022-05-17 DEATH — deceased

## 2022-05-21 ENCOUNTER — Inpatient Hospital Stay: Payer: 59

## 2022-05-28 ENCOUNTER — Inpatient Hospital Stay: Payer: 59

## 2022-06-11 ENCOUNTER — Inpatient Hospital Stay: Payer: 59

## 2022-06-18 ENCOUNTER — Inpatient Hospital Stay: Payer: 59

## 2022-06-18 ENCOUNTER — Inpatient Hospital Stay: Payer: 59 | Admitting: Hematology

## 2022-06-25 ENCOUNTER — Inpatient Hospital Stay: Payer: 59 | Admitting: Hematology

## 2022-08-05 ENCOUNTER — Encounter (HOSPITAL_COMMUNITY): Payer: Self-pay | Admitting: Internal Medicine
